# Patient Record
Sex: Male | Born: 1970 | Race: White | Hispanic: No | Marital: Single | Smoking: Current every day smoker
Health system: Southern US, Community
[De-identification: ages and names within clinical notes are randomized; demographics above are authoritative.]

## PROBLEM LIST (undated history)

## (undated) DIAGNOSIS — Z950 Presence of cardiac pacemaker: Secondary | ICD-10-CM

## (undated) DIAGNOSIS — Z720 Tobacco use: Secondary | ICD-10-CM

## (undated) DIAGNOSIS — R0600 Dyspnea, unspecified: Secondary | ICD-10-CM

## (undated) DIAGNOSIS — F1110 Opioid abuse, uncomplicated: Secondary | ICD-10-CM

## (undated) DIAGNOSIS — F1011 Alcohol abuse, in remission: Secondary | ICD-10-CM

## (undated) DIAGNOSIS — M545 Low back pain, unspecified: Secondary | ICD-10-CM

## (undated) DIAGNOSIS — Z9889 Other specified postprocedural states: Secondary | ICD-10-CM

## (undated) DIAGNOSIS — M961 Postlaminectomy syndrome, not elsewhere classified: Secondary | ICD-10-CM

## (undated) DIAGNOSIS — J45909 Unspecified asthma, uncomplicated: Secondary | ICD-10-CM

## (undated) DIAGNOSIS — R45850 Homicidal ideations: Secondary | ICD-10-CM

## (undated) DIAGNOSIS — F319 Bipolar disorder, unspecified: Secondary | ICD-10-CM

## (undated) DIAGNOSIS — G8929 Other chronic pain: Secondary | ICD-10-CM

## (undated) DIAGNOSIS — Z96659 Presence of unspecified artificial knee joint: Secondary | ICD-10-CM

## (undated) DIAGNOSIS — N529 Male erectile dysfunction, unspecified: Secondary | ICD-10-CM

## (undated) DIAGNOSIS — M6281 Muscle weakness (generalized): Secondary | ICD-10-CM

## (undated) DIAGNOSIS — D126 Benign neoplasm of colon, unspecified: Secondary | ICD-10-CM

## (undated) DIAGNOSIS — N400 Enlarged prostate without lower urinary tract symptoms: Secondary | ICD-10-CM

## (undated) DIAGNOSIS — J189 Pneumonia, unspecified organism: Secondary | ICD-10-CM

## (undated) DIAGNOSIS — E876 Hypokalemia: Secondary | ICD-10-CM

## (undated) DIAGNOSIS — E7800 Pure hypercholesterolemia, unspecified: Secondary | ICD-10-CM

## (undated) DIAGNOSIS — K5900 Constipation, unspecified: Secondary | ICD-10-CM

## (undated) DIAGNOSIS — F1910 Other psychoactive substance abuse, uncomplicated: Secondary | ICD-10-CM

## (undated) DIAGNOSIS — G9520 Unspecified cord compression: Secondary | ICD-10-CM

## (undated) DIAGNOSIS — J449 Chronic obstructive pulmonary disease, unspecified: Secondary | ICD-10-CM

## (undated) DIAGNOSIS — K219 Gastro-esophageal reflux disease without esophagitis: Secondary | ICD-10-CM

## (undated) DIAGNOSIS — Z8673 Personal history of transient ischemic attack (TIA), and cerebral infarction without residual deficits: Secondary | ICD-10-CM

## (undated) DIAGNOSIS — Z5989 Other problems related to housing and economic circumstances: Secondary | ICD-10-CM

## (undated) DIAGNOSIS — I639 Cerebral infarction, unspecified: Secondary | ICD-10-CM

## (undated) DIAGNOSIS — G894 Chronic pain syndrome: Secondary | ICD-10-CM

## (undated) DIAGNOSIS — F1290 Cannabis use, unspecified, uncomplicated: Secondary | ICD-10-CM

## (undated) DIAGNOSIS — R569 Unspecified convulsions: Secondary | ICD-10-CM

## (undated) DIAGNOSIS — G839 Paralytic syndrome, unspecified: Secondary | ICD-10-CM

## (undated) DIAGNOSIS — I10 Essential (primary) hypertension: Secondary | ICD-10-CM

## (undated) DIAGNOSIS — R519 Headache, unspecified: Secondary | ICD-10-CM

## (undated) DIAGNOSIS — M48061 Spinal stenosis, lumbar region without neurogenic claudication: Secondary | ICD-10-CM

## (undated) DIAGNOSIS — G959 Disease of spinal cord, unspecified: Secondary | ICD-10-CM

## (undated) DIAGNOSIS — M797 Fibromyalgia: Secondary | ICD-10-CM

## (undated) DIAGNOSIS — F209 Schizophrenia, unspecified: Secondary | ICD-10-CM

## (undated) DIAGNOSIS — Z550 Illiteracy and low-level literacy: Secondary | ICD-10-CM

## (undated) DIAGNOSIS — M1990 Unspecified osteoarthritis, unspecified site: Secondary | ICD-10-CM

## (undated) DIAGNOSIS — I6789 Other cerebrovascular disease: Secondary | ICD-10-CM

## (undated) DIAGNOSIS — Z79899 Other long term (current) drug therapy: Secondary | ICD-10-CM

## (undated) DIAGNOSIS — R748 Abnormal levels of other serum enzymes: Secondary | ICD-10-CM

## (undated) HISTORY — PX: SHOULDER SURGERY: SHX246

## (undated) HISTORY — PX: CLAVICLE SURGERY: SHX598

## (undated) HISTORY — PX: KNEE SURGERY: SHX244

## (undated) HISTORY — DX: Cerebral infarction, unspecified: I63.9

## (undated) HISTORY — PX: CERVICAL SPINE SURGERY: SHX589

## (undated) HISTORY — PX: NECK SURGERY: SHX720

---

## 1997-11-17 ENCOUNTER — Emergency Department (HOSPITAL_COMMUNITY): Admission: EM | Admit: 1997-11-17 | Discharge: 1997-11-17 | Payer: Self-pay | Admitting: Emergency Medicine

## 1998-07-25 ENCOUNTER — Emergency Department (HOSPITAL_COMMUNITY): Admission: EM | Admit: 1998-07-25 | Discharge: 1998-07-25 | Payer: Self-pay

## 2004-08-31 ENCOUNTER — Ambulatory Visit: Payer: Self-pay | Admitting: General Surgery

## 2004-09-13 ENCOUNTER — Emergency Department: Payer: Self-pay | Admitting: Emergency Medicine

## 2004-09-13 ENCOUNTER — Other Ambulatory Visit: Payer: Self-pay

## 2006-11-19 ENCOUNTER — Emergency Department: Payer: Self-pay | Admitting: Emergency Medicine

## 2008-12-08 ENCOUNTER — Ambulatory Visit: Payer: Self-pay | Admitting: Adult Health

## 2008-12-15 ENCOUNTER — Ambulatory Visit: Payer: Self-pay

## 2008-12-26 ENCOUNTER — Ambulatory Visit: Payer: Self-pay

## 2009-04-06 ENCOUNTER — Emergency Department: Payer: Self-pay | Admitting: Emergency Medicine

## 2010-01-09 ENCOUNTER — Ambulatory Visit: Payer: Self-pay | Admitting: Internal Medicine

## 2010-01-15 ENCOUNTER — Ambulatory Visit: Payer: Self-pay | Admitting: Internal Medicine

## 2010-02-23 DIAGNOSIS — J449 Chronic obstructive pulmonary disease, unspecified: Secondary | ICD-10-CM | POA: Insufficient documentation

## 2010-07-22 DIAGNOSIS — G9520 Unspecified cord compression: Secondary | ICD-10-CM | POA: Insufficient documentation

## 2010-07-22 DIAGNOSIS — M961 Postlaminectomy syndrome, not elsewhere classified: Secondary | ICD-10-CM | POA: Insufficient documentation

## 2010-08-30 DIAGNOSIS — M545 Low back pain, unspecified: Secondary | ICD-10-CM | POA: Insufficient documentation

## 2010-09-09 DIAGNOSIS — Z8673 Personal history of transient ischemic attack (TIA), and cerebral infarction without residual deficits: Secondary | ICD-10-CM

## 2010-09-09 HISTORY — DX: Personal history of transient ischemic attack (TIA), and cerebral infarction without residual deficits: Z86.73

## 2010-09-30 DIAGNOSIS — R531 Weakness: Secondary | ICD-10-CM | POA: Insufficient documentation

## 2010-09-30 DIAGNOSIS — Z720 Tobacco use: Secondary | ICD-10-CM | POA: Insufficient documentation

## 2010-10-17 ENCOUNTER — Inpatient Hospital Stay: Payer: Self-pay | Admitting: Psychiatry

## 2010-12-17 DIAGNOSIS — N400 Enlarged prostate without lower urinary tract symptoms: Secondary | ICD-10-CM | POA: Insufficient documentation

## 2011-11-11 DIAGNOSIS — K5900 Constipation, unspecified: Secondary | ICD-10-CM | POA: Insufficient documentation

## 2012-01-06 DIAGNOSIS — E7800 Pure hypercholesterolemia, unspecified: Secondary | ICD-10-CM | POA: Insufficient documentation

## 2012-04-09 DIAGNOSIS — R059 Cough, unspecified: Secondary | ICD-10-CM | POA: Insufficient documentation

## 2012-04-09 DIAGNOSIS — R05 Cough: Secondary | ICD-10-CM | POA: Insufficient documentation

## 2012-04-09 DIAGNOSIS — M48061 Spinal stenosis, lumbar region without neurogenic claudication: Secondary | ICD-10-CM | POA: Insufficient documentation

## 2012-07-17 DIAGNOSIS — Z79899 Other long term (current) drug therapy: Secondary | ICD-10-CM | POA: Insufficient documentation

## 2012-07-17 DIAGNOSIS — G894 Chronic pain syndrome: Secondary | ICD-10-CM | POA: Insufficient documentation

## 2012-10-03 DIAGNOSIS — K219 Gastro-esophageal reflux disease without esophagitis: Secondary | ICD-10-CM | POA: Insufficient documentation

## 2012-10-03 DIAGNOSIS — I10 Essential (primary) hypertension: Secondary | ICD-10-CM | POA: Insufficient documentation

## 2012-10-03 DIAGNOSIS — S8990XA Unspecified injury of unspecified lower leg, initial encounter: Secondary | ICD-10-CM | POA: Insufficient documentation

## 2013-06-28 DIAGNOSIS — F1110 Opioid abuse, uncomplicated: Secondary | ICD-10-CM | POA: Insufficient documentation

## 2013-06-28 DIAGNOSIS — F609 Personality disorder, unspecified: Secondary | ICD-10-CM | POA: Insufficient documentation

## 2013-07-28 ENCOUNTER — Ambulatory Visit: Payer: Self-pay | Admitting: Family Medicine

## 2013-10-11 DIAGNOSIS — E785 Hyperlipidemia, unspecified: Secondary | ICD-10-CM | POA: Insufficient documentation

## 2013-10-11 DIAGNOSIS — I10 Essential (primary) hypertension: Secondary | ICD-10-CM | POA: Insufficient documentation

## 2013-12-02 ENCOUNTER — Emergency Department: Payer: Self-pay | Admitting: Emergency Medicine

## 2014-06-05 LAB — SALICYLATE LEVEL: Salicylates, Serum: <4 mg/dL

## 2014-06-05 LAB — CBC
HCT: 41.8 % (ref 40.0–52.0)
HGB: 14.7 g/dL (ref 13.0–18.0)
MCH: 30.9 pg (ref 26.0–34.0)
MCHC: 35.3 g/dL (ref 32.0–36.0)
MCV: 88 fL (ref 80–100)
Platelet: 236 10*3/uL (ref 150–440)
RBC: 4.76 10*6/uL (ref 4.40–5.90)
RDW: 13.4 % (ref 11.5–14.5)
WBC: 9.2 10*3/uL (ref 3.8–10.6)

## 2014-06-05 LAB — URINALYSIS, COMPLETE
BILIRUBIN, UR: NEGATIVE
Blood: NEGATIVE
GLUCOSE, UR: NEGATIVE mg/dL (ref 0–75)
Leukocyte Esterase: NEGATIVE
Nitrite: NEGATIVE
PH: 6 (ref 4.5–8.0)
Protein: NEGATIVE
Specific Gravity: 1.005 (ref 1.003–1.030)
Squamous Epithelial: NONE SEEN

## 2014-06-05 LAB — ETHANOL: Ethanol: <5 mg/dL

## 2014-06-05 LAB — ACETAMINOPHEN LEVEL: Acetaminophen: <10 ug/mL

## 2014-06-05 LAB — DRUG SCREEN, URINE
Amphetamines, Ur Screen: NEGATIVE
Barbiturates, Ur Screen: NEGATIVE
Benzodiazepine, Ur Scrn: NEGATIVE
Cannabinoid 50 Ng, Ur ~~LOC~~: POSITIVE
Cocaine Metabolite,Ur ~~LOC~~: NEGATIVE
MDMA (Ecstasy)Ur Screen: NEGATIVE
METHADONE, UR SCREEN: NEGATIVE
OPIATE, UR SCREEN: NEGATIVE
PHENCYCLIDINE (PCP) UR S: NEGATIVE
Tricyclic, Ur Screen: NEGATIVE

## 2014-06-05 LAB — COMPREHENSIVE METABOLIC PANEL
AST: 38 U/L
Albumin: 4.5 g/dL
Alkaline Phosphatase: 55 U/L
Anion Gap: 10 (ref 7–16)
BUN: 5 mg/dL — AB
Bilirubin,Total: 0.7 mg/dL
CHLORIDE: 98 mmol/L — AB
CO2: 27 mmol/L
Calcium, Total: 9.3 mg/dL
Creatinine: 0.81 mg/dL
GLUCOSE: 92 mg/dL
Potassium: 3 mmol/L — ABNORMAL LOW
SGPT (ALT): 30 U/L
Sodium: 135 mmol/L
Total Protein: 7.8 g/dL

## 2014-06-06 ENCOUNTER — Inpatient Hospital Stay
Admit: 2014-06-06 | Disposition: A | Discharge status: Home / Self Care | Payer: Self-pay | Attending: Psychiatry | Admitting: Psychiatry

## 2014-06-08 LAB — POTASSIUM: POTASSIUM: 3.7 mmol/L
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
## 2014-06-22 ENCOUNTER — Encounter: Payer: Self-pay | Admitting: Emergency Medicine

## 2014-06-22 ENCOUNTER — Emergency Department
Admission: EM | Admit: 2014-06-22 | Discharge: 2014-06-23 | Disposition: A | Discharge status: Home / Self Care | Payer: Medicaid Other | Attending: Emergency Medicine | Admitting: Emergency Medicine

## 2014-06-22 DIAGNOSIS — F319 Bipolar disorder, unspecified: Secondary | ICD-10-CM | POA: Insufficient documentation

## 2014-06-22 DIAGNOSIS — R4689 Other symptoms and signs involving appearance and behavior: Secondary | ICD-10-CM

## 2014-06-22 DIAGNOSIS — F919 Conduct disorder, unspecified: Secondary | ICD-10-CM | POA: Diagnosis present

## 2014-06-22 DIAGNOSIS — Z79899 Other long term (current) drug therapy: Secondary | ICD-10-CM | POA: Insufficient documentation

## 2014-06-22 HISTORY — DX: Schizophrenia, unspecified: F20.9

## 2014-06-22 LAB — COMPREHENSIVE METABOLIC PANEL
ALK PHOS: 56 U/L (ref 38–126)
ALT: 47 U/L (ref 17–63)
AST: 53 U/L — AB (ref 15–41)
Albumin: 4.3 g/dL (ref 3.5–5.0)
Anion gap: 10 (ref 5–15)
BUN: 7 mg/dL (ref 6–20)
CALCIUM: 9.4 mg/dL (ref 8.9–10.3)
CO2: 29 mmol/L (ref 22–32)
Chloride: 95 mmol/L — ABNORMAL LOW (ref 101–111)
Creatinine, Ser: 0.86 mg/dL (ref 0.61–1.24)
Glucose, Bld: 95 mg/dL (ref 65–99)
Potassium: 3.9 mmol/L (ref 3.5–5.1)
SODIUM: 134 mmol/L — AB (ref 135–145)
Total Bilirubin: 0.3 mg/dL (ref 0.3–1.2)
Total Protein: 7.6 g/dL (ref 6.5–8.1)

## 2014-06-22 LAB — URINE DRUG SCREEN, QUALITATIVE (ARMC ONLY)
AMPHETAMINES, UR SCREEN: NOT DETECTED
BENZODIAZEPINE, UR SCRN: NOT DETECTED
Barbiturates, Ur Screen: NOT DETECTED
Cannabinoid 50 Ng, Ur ~~LOC~~: POSITIVE — AB
Cocaine Metabolite,Ur ~~LOC~~: NOT DETECTED
MDMA (Ecstasy)Ur Screen: NOT DETECTED
Methadone Scn, Ur: NOT DETECTED
Opiate, Ur Screen: NOT DETECTED
Phencyclidine (PCP) Ur S: NOT DETECTED
TRICYCLIC, UR SCREEN: POSITIVE — AB

## 2014-06-22 LAB — CBC
HEMATOCRIT: 40.3 % (ref 40.0–52.0)
HEMOGLOBIN: 13.5 g/dL (ref 13.0–18.0)
MCH: 30 pg (ref 26.0–34.0)
MCHC: 33.5 g/dL (ref 32.0–36.0)
MCV: 89.5 fL (ref 80.0–100.0)
PLATELETS: 262 10*3/uL (ref 150–440)
RBC: 4.5 MIL/uL (ref 4.40–5.90)
RDW: 13.7 % (ref 11.5–14.5)
WBC: 11.6 10*3/uL — AB (ref 3.8–10.6)

## 2014-06-22 LAB — URINALYSIS COMPLETE WITH MICROSCOPIC (ARMC ONLY)
BACTERIA UA: NONE SEEN
Bilirubin Urine: NEGATIVE
Glucose, UA: NEGATIVE mg/dL
Hgb urine dipstick: NEGATIVE
Ketones, ur: NEGATIVE mg/dL
NITRITE: NEGATIVE
PROTEIN: NEGATIVE mg/dL
RBC / HPF: NONE SEEN RBC/hpf (ref 0–5)
Specific Gravity, Urine: 1.008 (ref 1.005–1.030)
Squamous Epithelial / LPF: NONE SEEN
pH: 6 (ref 5.0–8.0)

## 2014-06-22 LAB — SALICYLATE LEVEL: Salicylate Lvl: <4 mg/dL (ref 2.8–30.0)

## 2014-06-22 LAB — ACETAMINOPHEN LEVEL: Acetaminophen (Tylenol), Serum: <10 ug/mL — ABNORMAL LOW (ref 10–30)

## 2014-06-22 LAB — ETHANOL

## 2014-06-22 MED ORDER — PANTOPRAZOLE SODIUM 40 MG PO TBEC
DELAYED_RELEASE_TABLET | ORAL | Status: AC
Start: 2014-06-22 — End: 2014-06-22
  Filled 2014-06-22: qty 1

## 2014-06-22 MED ORDER — LORAZEPAM 2 MG PO TABS
ORAL_TABLET | ORAL | Status: AC
  Filled 2014-06-22: qty 1

## 2014-06-22 MED ORDER — LORAZEPAM 2 MG PO TABS
2.0000 mg | ORAL_TABLET | Freq: Once | ORAL | Status: AC
  Administered 2014-06-22: 2 mg via ORAL

## 2014-06-22 MED ORDER — HALOPERIDOL 5 MG PO TABS
ORAL_TABLET | ORAL | Status: AC
  Filled 2014-06-22: qty 1

## 2014-06-22 MED ORDER — NICOTINE 10 MG IN INHA
1.0000 | RESPIRATORY_TRACT | Status: DC | PRN
  Administered 2014-06-22: 18:00:00 via RESPIRATORY_TRACT
  Administered 2014-06-23 (×2): 1 via RESPIRATORY_TRACT

## 2014-06-22 MED ORDER — HALOPERIDOL 5 MG PO TABS
5.0000 mg | ORAL_TABLET | Freq: Once | ORAL | Status: AC
  Administered 2014-06-22: 5 mg via ORAL

## 2014-06-22 MED ORDER — DICLOFENAC SODIUM 75 MG PO TBEC
75.0000 mg | DELAYED_RELEASE_TABLET | Freq: Two times a day (BID) | ORAL | Status: DC
  Administered 2014-06-22 – 2014-06-23 (×2): 75 mg via ORAL
  Filled 2014-06-22 (×3): qty 1

## 2014-06-22 MED ORDER — PREGABALIN 50 MG PO CAPS
ORAL_CAPSULE | ORAL | Status: AC
  Filled 2014-06-22: qty 1

## 2014-06-22 MED ORDER — PANTOPRAZOLE SODIUM 40 MG PO TBEC
40.0000 mg | DELAYED_RELEASE_TABLET | Freq: Every day | ORAL | Status: DC
  Administered 2014-06-22 – 2014-06-23 (×2): 40 mg via ORAL

## 2014-06-22 MED ORDER — PREGABALIN 75 MG PO CAPS
ORAL_CAPSULE | ORAL | Status: AC
  Filled 2014-06-22: qty 2

## 2014-06-22 MED ORDER — FLUTICASONE PROPIONATE HFA 44 MCG/ACT IN AERO
1.0000 | INHALATION_SPRAY | Freq: Two times a day (BID) | RESPIRATORY_TRACT | Status: DC
  Administered 2014-06-22 – 2014-06-23 (×2): 1 via RESPIRATORY_TRACT
  Filled 2014-06-22: qty 10.6

## 2014-06-22 MED ORDER — PREGABALIN 75 MG PO CAPS
200.0000 mg | ORAL_CAPSULE | Freq: Three times a day (TID) | ORAL | Status: DC
  Administered 2014-06-22 – 2014-06-23 (×2): 200 mg via ORAL

## 2014-06-22 MED ORDER — NICOTINE 10 MG IN INHA
RESPIRATORY_TRACT | Status: AC
  Filled 2014-06-22: qty 36

## 2014-06-22 NOTE — ED Notes (Signed)
Pt resting in hallway recliner with eyes closed. No unusual behavior observed. Pt has no needs or concerns at this time.

## 2014-06-22 NOTE — ED Notes (Signed)
Pt report received from Curt BearsJennifer J RN. Pt care assumed. Pt resting in recliner with no needs or concerns at this time.

## 2014-06-22 NOTE — ED Notes (Signed)
BEHAVIORAL HEALTH ROUNDING Patient sleeping: Yes.   Patient alert and oriented: asleep Behavior appropriate: Yes.  ; If no, describe:  Nutrition and fluids offered: asleep Toileting and hygiene offered: asleep Sitter present: no Law enforcement present: Yes, ODS 
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
## 2014-06-22 NOTE — ED Notes (Signed)
Patient to Ed with officers with IVC paperwork. Patient states " I don't know why I am here, the police told me it was for an evaluation". Patient states Dr. Elesa MassedWard is psychiatrist and Dr. Andrey CampanileWilson [facility]. Patient denies hallucinations.
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
## 2014-06-22 NOTE — ED Notes (Signed)
BEHAVIORAL HEALTH ROUNDING Patient sleeping: No. Patient alert and oriented: yes Behavior appropriate: Yes.  ; If no, describe:  Nutrition and fluids offered: Yes  Toileting and hygiene offered: Yes  Sitter present: no Law enforcement present: Yes  
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
## 2014-06-22 NOTE — ED Notes (Signed)
BEHAVIORAL HEALTH ROUNDING Patient sleeping: No. Patient alert and oriented: yes Behavior appropriate: Yes.  ; If no, describe:  Nutrition and fluids offered: Yes  Toileting and hygiene offered: Yes  Sitter present: no Law enforcement present: Yes, ODS 

## 2014-06-22 NOTE — ED Notes (Signed)
Pt provided with sandwich tray and drink.  

## 2014-06-22 NOTE — ED Notes (Signed)

## 2014-06-23 DIAGNOSIS — F3160 Bipolar disorder, current episode mixed, unspecified: Secondary | ICD-10-CM | POA: Insufficient documentation

## 2014-06-23 MED ORDER — PREGABALIN 50 MG PO CAPS
ORAL_CAPSULE | ORAL | Status: AC
Start: 2014-06-23 — End: 2014-06-23
  Filled 2014-06-23: qty 4

## 2014-06-23 MED ORDER — PANTOPRAZOLE SODIUM 40 MG PO TBEC
DELAYED_RELEASE_TABLET | ORAL | Status: AC
Start: 2014-06-23 — End: 2014-06-23
  Filled 2014-06-23: qty 1

## 2014-06-23 MED ORDER — ATORVASTATIN CALCIUM 20 MG PO TABS: 20.0000 mg | ORAL_TABLET | Freq: Every day | ORAL | Status: DC

## 2014-06-23 MED ORDER — HYDROCHLOROTHIAZIDE 25 MG PO TABS
25.0000 mg | ORAL_TABLET | Freq: Once | ORAL | Status: AC
  Administered 2014-06-23: 25 mg via ORAL

## 2014-06-23 MED ORDER — HYDROCHLOROTHIAZIDE 25 MG PO TABS
ORAL_TABLET | ORAL | Status: AC
  Filled 2014-06-23: qty 1

## 2014-06-23 NOTE — ED Notes (Signed)
BEHAVIORAL HEALTH ROUNDING Patient sleeping: No. Patient alert and oriented: yes Behavior appropriate: Yes.  ; If no, describe:  Nutrition and fluids offered: Yes  Toileting and hygiene offered: Yes  Sitter present: no Law enforcement present: Yes  

## 2014-06-23 NOTE — ED Notes (Signed)
BEHAVIORAL HEALTH ROUNDING Patient sleeping: Yes.   Patient alert and oriented: asleep Behavior appropriate: Yes.  ; If no, describe:  Nutrition and fluids offered: asleep Toileting and hygiene offered: asleep Sitter present: no Law enforcement present: Yes, ODS 

## 2014-06-23 NOTE — ED Provider Notes (Addendum)
-----------------------------------------   7:37 AM on 06/23/2014 -----------------------------------------   BP 127/81 mmHg  Pulse 91  Temp(Src) 98 F (36.7 C) (Oral)  Resp 17  Ht 6' (1.829 m)  Wt 240 lb (108.863 kg)  BMI 32.54 kg/m2  SpO2 96%  The patient had no acute events since last update.  Calm and cooperative at this time.  Disposition is pending per Psychiatry/Behavioral Medicine team recommendations.     Arelia Longestavid M , MD 06/23/14 [phone]  Patient is resting comfortably and is no longer suicidal or homicidal. The patient will be discharged with [facility] follow-up.    Arelia Longestavid M , MD 06/23/14 [phone]

## 2014-06-23 NOTE — ED Notes (Signed)
IVC rescinded/Consult completed by Dr.Faheem

## 2014-06-23 NOTE — ED Notes (Signed)
Pt resting in bed with eyes closed. No unusual behavior observed. Pt has no needs or concerns at this time. Will continue to monitor and f/u as needed.  

## 2014-06-23 NOTE — Discharge Instructions (Signed)
Aggression Physically aggressive behavior is common among small children. When frustrated or angry, toddlers may act out. Often, they will push, bite, or hit. Most children show less physical aggression as they grow up. Their language and interpersonal skills improve, too. But continued aggressive behavior is a sign of a problem. This behavior can lead to aggression and delinquency in adolescence and adulthood. Aggressive behavior can be psychological or physical. Forms of psychological aggression include threatening or bullying others. Forms of physical aggression include:  Pushing.  Hitting.  Slapping.  Kicking.  Stabbing.  Shooting.  Raping. PREVENTION  Encouraging the following behaviors can help manage aggression:  Respecting others and valuing differences.  Participating in school and community functions, including sports, music, after-school programs, community groups, and volunteer work.  Talking with an adult when they are sad, depressed, fearful, anxious, or angry. Discussions with a parent or other family member, Veterinary surgeoncounselor, Runner, broadcasting/film/videoteacher, or coach can help.  Avoiding alcohol and drug use.  Dealing with disagreements without aggression, such as conflict resolution. To learn this, children need parents and caregivers to model respectful communication and problem solving.  Limiting exposure to aggression and violence, such as video games that are not age appropriate, violence in the media, or domestic violence. Document Released: 11/28/2006 Document Revised: 04/25/2011 Document Reviewed: 04/08/2010 [facility] Patient Information 2015 [place], [place]. This information is not intended to replace advice given to you by your health care provider. Make sure you discuss any questions you have with your health care provider.  Bipolar Disorder Bipolar disorder is a mental illness. The term bipolar disorder actually is used to describe a group of disorders that all share varying degrees  of emotional highs and lows that can interfere with daily functioning, such as work, school, or relationships. Bipolar disorder also can lead to drug abuse, hospitalization, and suicide. The emotional highs of bipolar disorder are periods of elation or irritability and high energy. These highs can range from a mild form (hypomania) to a severe form (mania). People experiencing episodes of hypomania may appear energetic, excitable, and highly productive. People experiencing mania may behave impulsively or erratically. They often make poor decisions. They may have difficulty sleeping. The most severe episodes of mania can involve having very distorted beliefs or perceptions about the world and seeing or hearing things that are not real (psychotic delusions and hallucinations).  The emotional lows of bipolar disorder (depression) also can range from mild to severe. Severe episodes of bipolar depression can involve psychotic delusions and hallucinations. Sometimes people with bipolar disorder experience a state of mixed mood. Symptoms of hypomania or mania and depression are both present during this mixed-mood episode. SIGNS AND SYMPTOMS There are signs and symptoms of the episodes of hypomania and mania as well as the episodes of depression. The signs and symptoms of hypomania and mania are similar but vary in severity. They include:  Inflated self-esteem or feeling of increased self-confidence.  Decreased need for sleep.  Unusual talkativeness (rapid or pressured speech) or the feeling of a need to keep talking.  Sensation of racing thoughts or constant talking, with quick shifts between topics that may or may not be related (flight of ideas).  Decreased ability to focus or concentrate.  Increased purposeful activity, such as work, studies, or social activity, or nonproductive activity, such as pacing, squirming and fidgeting, or finger and toe tapping.  Impulsive behavior and use of poor judgment,  resulting in high-risk activities, such as having unprotected sex or spending excessive amounts of money. Signs  and symptoms of depression include the following:   Feelings of sadness, hopelessness, or helplessness.  Frequent or uncontrollable episodes of crying.  Lack of feeling anything or caring about anything.  Difficulty sleeping or sleeping too much.  Inability to enjoy the things you used to enjoy.   Desire to be alone all the time.   Feelings of guilt or worthlessness.  Lack of energy or motivation.   Difficulty concentrating, remembering, or making decisions.  Change in appetite or weight beyond normal fluctuations.  Thoughts of death or the desire to harm yourself. DIAGNOSIS  Bipolar disorder is diagnosed through an assessment by your caregiver. Your caregiver will ask questions about your emotional episodes. There are two main types of bipolar disorder. People with type I bipolar disorder have manic episodes with or without depressive episodes. People with type II bipolar disorder have hypomanic episodes and major depressive episodes, which are more serious than mild depression. The type of bipolar disorder you have can make an important difference in how your illness is monitored and treated. Your caregiver may ask questions about your medical history and use of alcohol or drugs, including prescription medication. Certain medical conditions and substances also can cause emotional highs and lows that resemble bipolar disorder (secondary bipolar disorder).  TREATMENT  Bipolar disorder is a long-term illness. It is best controlled with continuous treatment rather than treatment only when symptoms occur. The following treatments can be prescribed for bipolar disorders:  Medication--Medication can be prescribed by a doctor that is an expert in treating mental disorders (psychiatrists). Medications called mood stabilizers are usually prescribed to help control the illness.  Other medications are sometimes added if symptoms of mania, depression, or psychotic delusions and hallucinations occur despite the use of a mood stabilizer.  Talk therapy--Some forms of talk therapy are helpful in providing support, education, and guidance. A combination of medication and talk therapy is best for managing the disorder over time. A procedure in which electricity is applied to your brain through your scalp (electroconvulsive therapy) is used in cases of severe mania when medication and talk therapy do not work or work too slowly. Document Released: 05/09/2000 Document Revised: 05/28/2012 Document Reviewed: 02/27/2012 [facility] Patient Information 2015 [place], [place]. This information is not intended to replace advice given to you by your health care provider. Make sure you discuss any questions you have with your health care provider.

## 2014-06-23 NOTE — ED Notes (Signed)
ED BHU PLACEMENT JUSTIFICATION Is the patient under IVC or is there intent for IVC: Yes.   Is the patient medically cleared: Yes.   Is there vacancy in the ED BHU: No. Is the population mix appropriate for patient: Yes.   Is the patient awaiting placement in inpatient or outpatient setting: No. Has the patient had a psychiatric consult: No. Survey of unit performed for contraband, proper placement and condition of furniture, tampering with fixtures in bathroom, shower, and each patient room: Yes.  ; Findings:  APPEARANCE/BEHAVIOR cooperative NEURO ASSESSMENT Orientation: AAO x3  Hallucinations: No.None noted (Hallucinations) Speech: Normal Gait: uses walker d/t knee brace RESPIRATORY ASSESSMENT Normal expansion.  Clear to auscultation.  No rales, rhonchi, or wheezing. CARDIOVASCULAR ASSESSMENT regular rate and rhythm, S1, S2 normal, no murmur, click, rub or gallop and heart tones audible, skin color wnl, warm and dry GASTROINTESTINAL ASSESSMENT wnl EXTREMITIES Moves all extremities  PLAN OF CARE Provide calm/safe environment. Vital signs assessed twice daily. ED BHU Assessment once each 12-hour shift. Collaborate with intake RN daily or as condition indicates. Assure the ED provider has rounded once each shift. Provide and encourage hygiene. Provide redirection as needed. Assess for escalating behavior; address immediately and inform ED provider.  Assess family dynamic and appropriateness for visitation as needed: Yes.  ; If necessary, describe findings:  Educate the patient/family about BHU procedures/visitation: Yes.  ; If necessary, describe findings:
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
## 2014-06-23 NOTE — ED Notes (Signed)
Pt discharge papers received and pt belongings given to pt to change. 1 Bag of belongings

## 2014-06-23 NOTE — ED Notes (Signed)
Pt discharged home after verbalizing understanding of discharge instructions; nad noted. 

## 2014-06-23 NOTE — ED Notes (Signed)
BEHAVIORAL HEALTH ROUNDING Patient sleeping: Yes.   Patient alert and oriented: sleeping Behavior appropriate: Yes.  ; If no, describe: sleeping Nutrition and fluids offered: sleeping Toileting and hygiene offered: sleeping Sitter present: no Law enforcement present: Yes  

## 2014-06-23 NOTE — ED Notes (Signed)

## 2014-06-23 NOTE — ED Notes (Signed)
BEHAVIORAL HEALTH ROUNDING Patient sleeping: Yes.   Patient alert and oriented: yes Behavior appropriate: Yes.  ; If no, describe:  Nutrition and fluids offered: Yes  Toileting and hygiene offered: Yes  Sitter present: no Law enforcement present: Yes  

## 2014-06-23 NOTE — ED Notes (Signed)
ED BHU PLACEMENT JUSTIFICATION Is the patient under IVC or is there intent for IVC: Yes.   Is the patient medically cleared: Yes.   Is there vacancy in the ED BHU: No. Is the population mix appropriate for patient: Yes.   Is the patient awaiting placement in inpatient or outpatient setting: Yes.   Has the patient had a psychiatric consult: No. Survey of unit performed for contraband, proper placement and condition of furniture, tampering with fixtures in bathroom, shower, and each patient room: Yes.  ; Findings:  APPEARANCE/BEHAVIOR calm and cooperative NEURO ASSESSMENT Orientation: time, place and person Hallucinations: No.None noted (Hallucinations) Speech: Normal Gait: normal RESPIRATORY ASSESSMENT Normal expansion.  Clear to auscultation.  No rales, rhonchi, or wheezing. CARDIOVASCULAR ASSESSMENT regular rate and rhythm, S1, S2 normal, no murmur, click, rub or gallop GASTROINTESTINAL ASSESSMENT soft, nontender, BS WNL, no r/g EXTREMITIES normal strength, tone, and muscle mass PLAN OF CARE Provide calm/safe environment. Vital signs assessed twice daily. ED BHU Assessment once each 12-hour shift. Collaborate with intake RN daily or as condition indicates. Assure the ED provider has rounded once each shift. Provide and encourage hygiene. Provide redirection as needed. Assess for escalating behavior; address immediately and inform ED provider.  Assess family dynamic and appropriateness for visitation as needed: Yes.  ; If necessary, describe findings:  Educate the patient/family about BHU procedures/visitation: Yes.  ; If necessary, describe findings:  

## 2014-07-05 ENCOUNTER — Emergency Department
Admission: EM | Admit: 2014-07-05 | Discharge: 2014-07-05 | Disposition: A | Discharge status: Home / Self Care | Payer: Medicaid Other | Attending: Emergency Medicine | Admitting: Emergency Medicine

## 2014-07-05 ENCOUNTER — Emergency Department: Payer: Medicaid Other

## 2014-07-05 ENCOUNTER — Encounter: Payer: Self-pay | Admitting: Emergency Medicine

## 2014-07-05 DIAGNOSIS — W182XXA Fall in (into) shower or empty bathtub, initial encounter: Secondary | ICD-10-CM | POA: Insufficient documentation

## 2014-07-05 DIAGNOSIS — Z7951 Long term (current) use of inhaled steroids: Secondary | ICD-10-CM | POA: Insufficient documentation

## 2014-07-05 DIAGNOSIS — S8002XA Contusion of left knee, initial encounter: Secondary | ICD-10-CM | POA: Diagnosis not present

## 2014-07-05 DIAGNOSIS — Y9389 Activity, other specified: Secondary | ICD-10-CM | POA: Insufficient documentation

## 2014-07-05 DIAGNOSIS — G8929 Other chronic pain: Secondary | ICD-10-CM | POA: Insufficient documentation

## 2014-07-05 DIAGNOSIS — Z791 Long term (current) use of non-steroidal anti-inflammatories (NSAID): Secondary | ICD-10-CM | POA: Insufficient documentation

## 2014-07-05 DIAGNOSIS — Y998 Other external cause status: Secondary | ICD-10-CM | POA: Diagnosis not present

## 2014-07-05 DIAGNOSIS — Y9289 Other specified places as the place of occurrence of the external cause: Secondary | ICD-10-CM | POA: Diagnosis not present

## 2014-07-05 DIAGNOSIS — M25562 Pain in left knee: Secondary | ICD-10-CM

## 2014-07-05 DIAGNOSIS — S8992XA Unspecified injury of left lower leg, initial encounter: Secondary | ICD-10-CM | POA: Diagnosis present

## 2014-07-05 DIAGNOSIS — Z79899 Other long term (current) drug therapy: Secondary | ICD-10-CM | POA: Insufficient documentation

## 2014-07-05 DIAGNOSIS — S8012XA Contusion of left lower leg, initial encounter: Secondary | ICD-10-CM | POA: Insufficient documentation

## 2014-07-05 DIAGNOSIS — Z720 Tobacco use: Secondary | ICD-10-CM | POA: Diagnosis not present

## 2014-07-05 DIAGNOSIS — I10 Essential (primary) hypertension: Secondary | ICD-10-CM

## 2014-07-05 MED ORDER — HYDROMORPHONE HCL 1 MG/ML IJ SOLN
INTRAMUSCULAR | Status: AC
  Administered 2014-07-05: 1 mg via INTRAMUSCULAR
  Filled 2014-07-05: qty 1

## 2014-07-05 MED ORDER — OXYCODONE-ACETAMINOPHEN 5-325 MG PO TABS: 1.0000 | ORAL_TABLET | ORAL | Status: DC | PRN

## 2014-07-05 MED ORDER — HYDROCODONE-ACETAMINOPHEN 5-325 MG PO TABS
ORAL_TABLET | ORAL | Status: AC
  Filled 2014-07-05: qty 2

## 2014-07-05 MED ORDER — HYDROMORPHONE HCL 1 MG/ML IJ SOLN
1.0000 mg | Freq: Once | INTRAMUSCULAR | Status: AC
  Administered 2014-07-05: 1 mg via INTRAMUSCULAR

## 2014-07-05 MED ORDER — HYDROCODONE-ACETAMINOPHEN 5-325 MG PO TABS
2.0000 | ORAL_TABLET | Freq: Once | ORAL | Status: AC
Start: 2014-07-05 — End: 2014-07-05
  Administered 2014-07-05: 2 via ORAL

## 2014-07-05 NOTE — ED Provider Notes (Signed)
[facility] Emergency Department Provider Note  ____________________________________________  Time seen: 1228  I have reviewed the triage vital signs and the nursing notes.   HISTORY  Chief Complaint Leg Pain and Back Pain   HPI Scott Howell is a 44 y.o. male states that he fell in shower this morning 2.He has a previous injury to his left knee with multiple fractures that was surgically corrected one year ago. He sees an orthopedist in [place] for his chronic knee pain. He was seen there for months ago and started on anti-inflammatories and pain pills. The pain he is experiencing today is more medially to his knee. He denies any head trauma or loss of consciousness. Currently he wears a knee brace from [facility] on his knee. He describes his pain as a 10 out of 10.     Past Medical History  Diagnosis Date  . Schizophrenia     Patient Active Problem List   Diagnosis Date Noted  . Bipolar 1 disorder, mixed     No past surgical history on file.  Current Outpatient Rx  Name  Route  Sig  Dispense  Refill  . potassium chloride (K-DUR,KLOR-CON) 10 MEQ tablet   Oral   Take 10 mEq by mouth 2 (two) times daily.         Marland Kitchen albuterol (PROVENTIL HFA;VENTOLIN HFA) 108 (90 BASE) MCG/ACT inhaler   Inhalation   Inhale 1 puff into the lungs every 6 (six) hours as needed for wheezing or shortness of breath.         Marland Kitchen atorvastatin (LIPITOR) 20 MG tablet   Oral   Take 20 mg by mouth daily.         . beclomethasone (QVAR) 40 MCG/ACT inhaler   Inhalation   Inhale 2 puffs into the lungs 2 (two) times daily.         Marland Kitchen CALCIUM PO   Oral   Take 1 tablet by mouth daily.         . diclofenac (VOLTAREN) 75 MG EC tablet   Oral   Take 75 mg by mouth 2 (two) times daily.         . DULoxetine (CYMBALTA) 60 MG capsule   Oral   Take 60 mg by mouth daily.         . hydrochlorothiazide (HYDRODIURIL) 25 MG tablet   Oral   Take 25 mg by mouth  daily.         Marland Kitchen omeprazole (PRILOSEC) 10 MG capsule   Oral   Take 20 mg by mouth daily.          Marland Kitchen oxyCODONE-acetaminophen (PERCOCET) 5-325 MG per tablet   Oral   Take 1 tablet by mouth every 4 (four) hours as needed for severe pain.   20 tablet   0   . potassium chloride (K-DUR) 10 MEQ tablet   Oral   Take 20 mEq by mouth daily.         . pregabalin (LYRICA) 200 MG capsule   Oral   Take 200 mg by mouth 3 (three) times daily. Take three times a day per [facility] pharmacy         . QUEtiapine (SEROQUEL) 50 MG tablet   Oral   Take 25 mg by mouth See admin instructions. Pharmacy states he takes tid daily and 2 tablets at bed time           Allergies Phentermine  No family history on file.  Social History History  Substance Use Topics  . Smoking status: Current Every Day Smoker  . Smokeless tobacco: Never Used  . Alcohol Use: No    Review of Systems Constitutional: No fever/chills Eyes: No visual changes. ENT: No sore throat. Cardiovascular: Denies chest pain. Respiratory: Denies shortness of breath. Gastrointestinal: No abdominal pain.  No nausea, no vomiting. Genitourinary: Negative for dysuria. Musculoskeletal: Negative for back pain. Skin: Negative for rash. Neurological: Negative for headaches, focal weakness or numbness. 10-point ROS otherwise negative.  ____________________________________________   PHYSICAL EXAM:  VITAL SIGNS: ED Triage Vitals  Enc Vitals Group     BP 07/05/14 1149 131/78 mmHg     Pulse Rate 07/05/14 1149 108     Resp 07/05/14 1149 18     Temp 07/05/14 1149 97.9 F (36.6 C)     Temp Source 07/05/14 1149 Oral     SpO2 07/05/14 1149 97 %     Weight 07/05/14 1149 240 lb (108.863 kg)     Height 07/05/14 1149 6\' 1"  (1.854 m)     Head Cir --      Peak Flow --      Pain Score 07/05/14 1150 10     Pain Loc --      Pain Edu? --      Excl. in GC? --     Constitutional: Alert and oriented. Well appearing and in no acute  distress. Eyes: Conjunctivae are normal. PERRL. EOMI. Head: Atraumatic. Nose: No congestion/rhinnorhea. Neck: No stridor.  No cervical tenderness on palpation. Range of motion within normal limits. Cardiovascular: Normal rate, regular rhythm. Grossly normal heart sounds.  Good peripheral circulation. Respiratory: Normal respiratory effort. Mild bilateral expiratory wheezes attributed to his smoking. No distress Gastrointestinal: Soft and nontender. No distention. No abdominal bruits. No CVA tenderness. Musculoskeletal: Moderate left knee tenderness with some soft tissue edema. There is no effusion noted. Range of motion is restricted secondary to pain. There are several indentions bilaterally made from the brace that he is currently wearing.  Neurologic:  Normal speech and language. No gross focal neurologic deficits are appreciated. Speech is normal. No gait instability. Skin:  Skin is warm, dry and intact. Psychiatric: Mood and affect are normal. Speech and behavior are normal.  ____________________________________________   LABS (all labs ordered are listed, but only abnormal results are displayed)  Labs Reviewed - No data to display RADIOLOGY  Left knee x-ray per radiologist, orthopedic hardware appears to be stable. There are fragments along the lateral tibial plateau. An acute superimposed fracture cannot be excluded and CT was recommended. CT of the left knee again shows the hardware from previous surgery to be stable there are no new fractures seen. There is some small to moderate knee effusion. ____________________________________________   PROCEDURES  Procedure(s) performed: None  Critical Care performed: No  ____________________________________________   INITIAL IMPRESSION / ASSESSMENT AND PLAN / ED COURSE  Pertinent labs & imaging results that were available during my care of the patient were reviewed by me and considered in my medical decision making (see chart for  details).  [facility] He prefers his brace over her lower knee immobilizer. He was given a prescription for Percocet for pain he is to continue on his anti-inflammatories. He is instructed to use ice and elevate to reduce swelling. He has a walker with him ____________________________________________   FINAL CLINICAL IMPRESSION(S) / ED DIAGNOSES  Final diagnoses:  Contusion, knee and lower leg, left, initial encounter  Tommi Rumps, PA-C 07/05/14 1529  Jene Every, MD 07/06/14 [phone]

## 2014-07-05 NOTE — Discharge Instructions (Signed)
Contusion A contusion is a deep bruise. Contusions are the result of an injury that caused bleeding under the skin. The contusion may turn blue, purple, or yellow. Minor injuries will give you a painless contusion, but more severe contusions may stay painful and swollen for a few weeks.  CAUSES  A contusion is usually caused by a blow, trauma, or direct force to an area of the body. SYMPTOMS   Swelling and redness of the injured area.  Bruising of the injured area.  Tenderness and soreness of the injured area.  Pain. DIAGNOSIS  The diagnosis can be made by taking a history and physical exam. An X-ray, CT scan, or MRI may be needed to determine if there were any associated injuries, such as fractures. TREATMENT  Specific treatment will depend on what area of the body was injured. In general, the best treatment for a contusion is resting, icing, elevating, and applying cold compresses to the injured area. Over-the-counter medicines may also be recommended for pain control. Ask your caregiver what the best treatment is for your contusion. HOME CARE INSTRUCTIONS   Put ice on the injured area.  Put ice in a plastic bag.  Place a towel between your skin and the bag.  Leave the ice on for 15-20 minutes, 3-4 times a day, or as directed by your health care provider.  Only take over-the-counter or prescription medicines for pain, discomfort, or fever as directed by your caregiver. Your caregiver may recommend avoiding anti-inflammatory medicines (aspirin, ibuprofen, and naproxen) for 48 hours because these medicines may increase bruising.  Rest the injured area.  If possible, elevate the injured area to reduce swelling. SEEK IMMEDIATE MEDICAL CARE IF:   You have increased bruising or swelling.  You have pain that is getting worse.  Your swelling or pain is not relieved with medicines. MAKE SURE YOU:   Understand these instructions.  Will watch your condition.  Will get help right  away if you are not doing well or get worse. Document Released: 11/10/2004 Document Revised: 02/05/2013 Document Reviewed: 12/06/2010 [facility] Patient Information 2015 [place], [place]. This information is not intended to replace advice given to you by your health care provider. Make sure you discuss any questions you have with your health care provider.  Cryotherapy Cryotherapy is when you put ice on your injury. Ice helps lessen pain and puffiness (swelling) after an injury. Ice works the best when you start using it in the first 24 to 48 hours after an injury. HOME CARE  Put a dry or damp towel between the ice pack and your skin.  You may press gently on the ice pack.  Leave the ice on for no more than 10 to 20 minutes at a time.  Check your skin after 5 minutes to make sure your skin is okay.  Rest at least 20 minutes between ice pack uses.  Stop using ice when your skin loses feeling (numbness).  Do not use ice on someone who cannot tell you when it hurts. This includes small children and people with memory problems (dementia). GET HELP RIGHT AWAY IF:  You have white spots on your skin.  Your skin turns blue or pale.  Your skin feels waxy or hard.  Your puffiness gets worse. MAKE SURE YOU:   Understand these instructions.  Will watch your condition.  Will get help right away if you are not doing well or get worse. Document Released: 07/20/2007 Document Revised: 04/25/2011 Document Reviewed: 09/23/2010 Exit[employer] Patient Information 2015  [employer]. This information is not intended to replace advice given to you by your health care provider. Make sure you discuss any questions you have with your health care provider.

## 2014-07-05 NOTE — ED Notes (Signed)
Patient to ED with c/o left leg pain after fall this morning x2 in shower. Patient reports previous history of injury to that leg and normally walks with walker. Patient also c/o lower back pain.

## 2014-07-06 ENCOUNTER — Emergency Department
Admission: EM | Admit: 2014-07-06 | Discharge: 2014-07-06 | Disposition: A | Discharge status: Home / Self Care | Payer: Medicaid Other | Source: Home / Self Care | Attending: Emergency Medicine | Admitting: Emergency Medicine

## 2014-07-06 ENCOUNTER — Encounter: Payer: Self-pay | Admitting: Emergency Medicine

## 2014-07-06 DIAGNOSIS — M25562 Pain in left knee: Secondary | ICD-10-CM

## 2014-07-06 HISTORY — DX: Chronic obstructive pulmonary disease, unspecified: J44.9

## 2014-07-06 HISTORY — DX: Gastro-esophageal reflux disease without esophagitis: K21.9

## 2014-07-06 HISTORY — DX: Bipolar disorder, unspecified: F31.9

## 2014-07-06 HISTORY — DX: Pure hypercholesterolemia, unspecified: E78.00

## 2014-07-06 HISTORY — DX: Essential (primary) hypertension: I10

## 2014-07-06 NOTE — ED Provider Notes (Signed)
[facility] Emergency Department Provider Note  ____________________________________________  Time seen: Approximately 6:41 AM  I have reviewed the triage vital signs and the nursing notes.   HISTORY  Chief Complaint Knee Pain  }  HPI Scott Howell is a 44 y.o. male patient with a history of chronic knee pain after a bad fracture fell in the shower yesterday was seen in the ER yesterday had an ad a CT of the knee which showed no acute changes patient returned in the evening yesterday planting of continued bed pain increased swelling or he had not reinjured the knee after waiting for me in the ER for some time the pain has improved and the knee is less swollen and is feeling somewhat better reports however that. Tylenol makes him hyper and so he had trouble resting until the Tylenol from the Percocet had gone out of his system he is asking for just oxycodone by itself I told him I could probably give him just a few pills patient denies any other injury patient is not swollen by the ankle   Past Medical History  Diagnosis Date  . Schizophrenia   . Bipolar 1 disorder   . COPD (chronic obstructive pulmonary disease)   . Hypertension   . High cholesterol   . GERD (gastroesophageal reflux disease)     Patient Active Problem List   Diagnosis Date Noted  . Bipolar 1 disorder, mixed     Past Surgical History  Procedure Laterality Date  . Cervical spine surgery      Current Outpatient Rx  Name  Route  Sig  Dispense  Refill  . albuterol (PROVENTIL HFA;VENTOLIN HFA) 108 (90 BASE) MCG/ACT inhaler   Inhalation   Inhale 1 puff into the lungs every 6 (six) hours as needed for wheezing or shortness of breath.         Marland Kitchen atorvastatin (LIPITOR) 20 MG tablet   Oral   Take 20 mg by mouth daily.         . beclomethasone (QVAR) 40 MCG/ACT inhaler   Inhalation   Inhale 2 puffs into the lungs 2 (two) times daily.         Marland Kitchen CALCIUM PO   Oral   Take 1 tablet  by mouth 2 (two) times daily.          . diclofenac (VOLTAREN) 75 MG EC tablet   Oral   Take 75 mg by mouth 2 (two) times daily.         . DULoxetine (CYMBALTA) 60 MG capsule   Oral   Take 60 mg by mouth daily.         . hydrochlorothiazide (HYDRODIURIL) 25 MG tablet   Oral   Take 25 mg by mouth daily.         Marland Kitchen omeprazole (PRILOSEC) 10 MG capsule   Oral   Take 20 mg by mouth daily.          Marland Kitchen oxyCODONE-acetaminophen (PERCOCET) 5-325 MG per tablet   Oral   Take 1 tablet by mouth every 4 (four) hours as needed for severe pain.   20 tablet   0   . potassium chloride (K-DUR) 10 MEQ tablet   Oral   Take 20 mEq by mouth daily.         . pregabalin (LYRICA) 200 MG capsule   Oral   Take 200 mg by mouth 3 (three) times daily. Take three times a day per [facility] pharmacy         .  QUEtiapine (SEROQUEL) 50 MG tablet   Oral   Take 25 mg by mouth See admin instructions. Pharmacy states he takes tid daily and 2 tablets at bed time         . potassium chloride (K-DUR,KLOR-CON) 10 MEQ tablet   Oral   Take 10 mEq by mouth 2 (two) times daily.           Allergies Acetaminophen  History reviewed. No pertinent family history.  Social History History  Substance Use Topics  . Smoking status: Current Every Day Smoker  . Smokeless tobacco: Never Used  . Alcohol Use: No    Review of Systems Constitutional: No fever/chills Eyes: No visual changes. ENT: No sore throat. Cardiovascular: Denies chest pain. Respiratory: Denies shortness of breath. Gastrointestinal: No abdominal pain.  No nausea, no vomiting.  No diarrhea.  No constipation. Genitourinary: Negative for dysuria. Musculoskeletal: Negative for back pain. Skin: Negative for rash. Neurological: Negative for headaches, focal weakness or numbness.  10-point ROS otherwise negative.  ____________________________________________   PHYSICAL EXAM:  VITAL SIGNS: ED Triage Vitals  Enc Vitals Group     BP  07/06/14 0013 136/86 mmHg     Pulse Rate 07/06/14 0013 85     Resp 07/06/14 0013 18     Temp 07/06/14 0013 98.2 F (36.8 C)     Temp Source 07/06/14 0013 Oral     SpO2 07/06/14 0013 97 %     Weight 07/06/14 0013 240 lb (108.863 kg)     Height 07/06/14 0013 6\' 1"  (1.854 m)     Head Cir --      Peak Flow --      Pain Score 07/06/14 0014 6     Pain Loc --      Pain Edu? --      Excl. in GC? --     Constitutional: Alert and oriented. Well appearing and in no acute distress. Eyes: Conjunctivae are normal. PERRL. EOMI. Head: Atraumatic. Nose: No congestion/rhinnorhea. Respiratory: Normal respiratory effort.  No retractions.  Musculoskeletal: No lower extremity tenderness nor edema. Except for over the left knee  No joint effusions. Neurologic:  Normal speech and language. No gross focal neurologic deficits are appreciated. Speech is normal. No gait instability. Skin:  Skin is warm, dry and intact. No rash noted. Psychiatric: Mood and affect are normal. Speech and behavior are normal.  ____________________________________________   LABS (all labs ordered are listed, but only abnormal results are displayed)  Labs Reviewed - No data to display ____________________________________________  EKG  Not done ____________________________________________  RADIOLOGY  Not done ____________________________________________   PROCEDURES  Procedure(s) performed: None  Critical Care performed: No  ____________________________________________   INITIAL IMPRESSION / ASSESSMENT AND PLAN / ED COURSE   ____________________________________________   FINAL CLINICAL IMPRESSION(S) / ED DIAGNOSES  Final diagnoses:  Left knee pain     Arnaldo NatalPaul F Malinda, MD 07/07/14 [phone]

## 2014-07-06 NOTE — ED Notes (Signed)
Pt says he fell in the shower around 830 Saturday morning; seen here for same and xrays were taken; negative for fracture; pt returns with increased pain

## 2014-07-06 NOTE — ED Notes (Signed)
D/c instructions reviewed w/ pt - pt denies any further questions or concerns at present.  Pt instructed to not use alcohol, drive, or operate heavy machinery while take the prescription pain medications as they could make him drowsy - pt verbalized understanding.   
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
## 2014-07-06 NOTE — Discharge Instructions (Signed)
Arthralgia Arthralgia is joint pain. A joint is a place where two bones meet. Joint pain can happen for many reasons. The joint can be bruised, stiff, infected, or weak from aging. Pain usually goes away after resting and taking medicine for soreness.  HOME CARE  Rest the joint as told by your doctor.  Keep the sore joint raised (elevated) for the first 24 hours.  Put ice on the joint area.  Put ice in a plastic bag.  Place a towel between your skin and the bag.  Leave the ice on for 15-20 minutes, 03-04 times a day.  Wear your splint, casting, elastic bandage, or sling as told by your doctor.  Only take medicine as told by your doctor. Do not take aspirin.  Use crutches as told by your doctor. Do not put weight on the joint until told to by your doctor. GET HELP RIGHT AWAY IF:   You have bruising, puffiness (swelling), or more pain.  Your fingers or toes turn blue or start to lose feeling (numb).  Your medicine does not lessen the pain.  Your pain becomes severe.  You have a temperature by mouth above 102 F (38.9 C), not controlled by medicine.  You cannot move or use the joint. MAKE SURE YOU:   Understand these instructions.  Will watch your condition.  Will get help right away if you are not doing well or get worse. Document Released: 01/19/2009 Document Revised: 04/25/2011 Document Reviewed: 01/19/2009 [facility] Patient Information 2015 [place], [place]. This information is not intended to replace advice given to you by your health care provider. Make sure you discuss any questions you have with your health care provider.   [facility]   Use the oxycofone as needed.  Return as needed.

## 2014-07-12 ENCOUNTER — Emergency Department
Admission: EM | Admit: 2014-07-12 | Discharge: 2014-07-12 | Disposition: A | Discharge status: Home / Self Care | Payer: Medicaid Other | Attending: Emergency Medicine | Admitting: Emergency Medicine

## 2014-07-12 DIAGNOSIS — Z791 Long term (current) use of non-steroidal anti-inflammatories (NSAID): Secondary | ICD-10-CM | POA: Diagnosis not present

## 2014-07-12 DIAGNOSIS — I10 Essential (primary) hypertension: Secondary | ICD-10-CM | POA: Insufficient documentation

## 2014-07-12 DIAGNOSIS — G8929 Other chronic pain: Secondary | ICD-10-CM | POA: Diagnosis not present

## 2014-07-12 DIAGNOSIS — Z79899 Other long term (current) drug therapy: Secondary | ICD-10-CM | POA: Insufficient documentation

## 2014-07-12 DIAGNOSIS — Z7951 Long term (current) use of inhaled steroids: Secondary | ICD-10-CM | POA: Insufficient documentation

## 2014-07-12 DIAGNOSIS — Z720 Tobacco use: Secondary | ICD-10-CM | POA: Insufficient documentation

## 2014-07-12 DIAGNOSIS — R609 Edema, unspecified: Secondary | ICD-10-CM | POA: Diagnosis not present

## 2014-07-12 DIAGNOSIS — M25562 Pain in left knee: Secondary | ICD-10-CM | POA: Diagnosis not present

## 2014-07-12 MED ORDER — OXYCODONE HCL 5 MG PO TABS: 5.0000 mg | ORAL_TABLET | Freq: Two times a day (BID) | ORAL | Status: DC

## 2014-07-12 NOTE — ED Notes (Signed)
States fell one week ago. States had pain R periobital and L knee. Has brace on L knee which he states is due to previous injury and surgeries same knee. Her for continued pain.

## 2014-07-12 NOTE — Discharge Instructions (Signed)
Arthralgia Arthralgia is joint pain. A joint is a place where two bones meet. Joint pain can happen for many reasons. The joint can be bruised, stiff, infected, or weak from aging. Pain usually goes away after resting and taking medicine for soreness.  HOME CARE  Rest the joint as told by your doctor.  Keep the sore joint raised (elevated) for the first 24 hours.  Put ice on the joint area.  Put ice in a plastic bag.  Place a towel between your skin and the bag.  Leave the ice on for 15-20 minutes, 03-04 times a day.  Wear your splint, casting, elastic bandage, or sling as told by your doctor.  Only take medicine as told by your doctor. Do not take aspirin.  Use crutches as told by your doctor. Do not put weight on the joint until told to by your doctor. GET HELP RIGHT AWAY IF:   You have bruising, puffiness (swelling), or more pain.  Your fingers or toes turn blue or start to lose feeling (numb).  Your medicine does not lessen the pain.  Your pain becomes severe.  You have a temperature by mouth above 102 F (38.9 C), not controlled by medicine.  You cannot move or use the joint. MAKE SURE YOU:   Understand these instructions.  Will watch your condition.  Will get help right away if you are not doing well or get worse. Document Released: 01/19/2009 Document Revised: 04/25/2011 Document Reviewed: 01/19/2009 [facility] Patient Information 2015 [place], [place]. This information is not intended to replace advice given to you by your health care provider. Make sure you discuss any questions you have with your health care provider.  Chronic Pain Chronic pain can be defined as pain that is off and on and lasts for 3-6 months or longer. Many things cause chronic pain, which can make it difficult to make a diagnosis. There are many treatment options available for chronic pain. However, finding a treatment that works well for you may require trying various approaches until the  right one is found. Many people benefit from a combination of two or more types of treatment to control their pain. SYMPTOMS  Chronic pain can occur anywhere in the body and can range from mild to very severe. Some types of chronic pain include:  Headache.  Low back pain.  Cancer pain.  Arthritis pain.  Neurogenic pain. This is pain resulting from damage to nerves. People with chronic pain may also have other symptoms such as:  Depression.  Anger.  Insomnia.  Anxiety. DIAGNOSIS  Your health care provider will help diagnose your condition over time. In many cases, the initial focus will be on excluding possible conditions that could be causing the pain. Depending on your symptoms, your health care provider may order tests to diagnose your condition. Some of these tests may include:   Blood tests.   CT scan.   MRI.   X-rays.   Ultrasounds.   Nerve conduction studies.  You may need to see a specialist.  TREATMENT  Finding treatment that works well may take time. You may be referred to a pain specialist. He or she may prescribe medicine or therapies, such as:   Mindful meditation or yoga.  Shots (injections) of numbing or pain-relieving medicines into the spine or area of pain.  Local electrical stimulation.  Acupuncture.   Massage therapy.   Aroma, color, light, or sound therapy.   Biofeedback.   Working with a physical therapist to keep from  getting stiff.   Regular, gentle exercise.   Cognitive or behavioral therapy.   Group support.  Sometimes, surgery may be recommended.  HOME CARE INSTRUCTIONS   Take all medicines as directed by your health care provider.   Lessen stress in your life by relaxing and doing things such as listening to calming music.   Exercise or be active as directed by your health care provider.   Eat a healthy diet and include things such as vegetables, fruits, fish, and lean meats in your diet.   Keep all  follow-up appointments with your health care provider.   Attend a support group with others suffering from chronic pain. SEEK MEDICAL CARE IF:   Your pain gets worse.   You develop a new pain that was not there before.   You cannot tolerate medicines given to you by your health care provider.   You have new symptoms since your last visit with your health care provider.  SEEK IMMEDIATE MEDICAL CARE IF:   You feel weak.   You have decreased sensation or numbness.   You lose control of bowel or bladder function.   Your pain suddenly gets much worse.   You develop shaking.  You develop chills.  You develop confusion.  You develop chest pain.  You develop shortness of breath.  MAKE SURE YOU:  Understand these instructions.  Will watch your condition.  Will get help right away if you are not doing well or get worse. Document Released: 10/23/2001 Document Revised: 10/03/2012 Document Reviewed: 07/27/2012 [facility] Patient Information 2015 [place], [place]. This information is not intended to replace advice given to you by your health care provider. Make sure you discuss any questions you have with your health care provider.  Take the prescription meds as directed. Apply ice and rest with the leg elevated to reduce swelling. [facility]

## 2014-07-12 NOTE — ED Provider Notes (Signed)
[facility] Emergency Department Provider Note ____________________________________________  Time seen: 1330  I have reviewed the triage vital signs and the nursing notes.  HISTORY  Chief Complaint Knee Pain and Wound Check  HPI Scott Howell is a 44 y.o. male reports to the ED with continued complaints of pain and swelling to the left knee. He has been seen 2 other times for this same complaint since he initially claimed the injury. He is here today requesting pain medicines, noting that his knee continues to swell. He has difficulty resting with the leg elevated, as he is a resident of the local homeless shelter, and has to be out of the shelter for 8 hours a day. He is here without reinjury rating his pain at a 10 out of 10 currently.  Past Medical History  Diagnosis Date  . Schizophrenia   . Bipolar 1 disorder   . COPD (chronic obstructive pulmonary disease)   . Hypertension   . High cholesterol   . GERD (gastroesophageal reflux disease)    Patient Active Problem List   Diagnosis Date Noted  . Bipolar 1 disorder, mixed    Past Surgical History  Procedure Laterality Date  . Cervical spine surgery     Current Outpatient Rx  Name  Route  Sig  Dispense  Refill  . albuterol (PROVENTIL HFA;VENTOLIN HFA) 108 (90 BASE) MCG/ACT inhaler   Inhalation   Inhale 1 puff into the lungs every 6 (six) hours as needed for wheezing or shortness of breath.         Marland Kitchen atorvastatin (LIPITOR) 20 MG tablet   Oral   Take 20 mg by mouth daily.         . beclomethasone (QVAR) 40 MCG/ACT inhaler   Inhalation   Inhale 2 puffs into the lungs 2 (two) times daily.         Marland Kitchen CALCIUM PO   Oral   Take 1 tablet by mouth 2 (two) times daily.          . diclofenac (VOLTAREN) 75 MG EC tablet   Oral   Take 75 mg by mouth 2 (two) times daily.         . DULoxetine (CYMBALTA) 60 MG capsule   Oral   Take 60 mg by mouth daily.         . hydrochlorothiazide  (HYDRODIURIL) 25 MG tablet   Oral   Take 25 mg by mouth daily.         Marland Kitchen omeprazole (PRILOSEC) 10 MG capsule   Oral   Take 20 mg by mouth daily.          Marland Kitchen oxyCODONE (ROXICODONE) 5 MG immediate release tablet   Oral   Take 1 tablet (5 mg total) by mouth 2 (two) times daily.   8 tablet   0   . oxyCODONE-acetaminophen (PERCOCET) 5-325 MG per tablet   Oral   Take 1 tablet by mouth every 4 (four) hours as needed for severe pain.   20 tablet   0   . potassium chloride (K-DUR) 10 MEQ tablet   Oral   Take 20 mEq by mouth daily.         . potassium chloride (K-DUR,KLOR-CON) 10 MEQ tablet   Oral   Take 10 mEq by mouth 2 (two) times daily.         . pregabalin (LYRICA) 200 MG capsule   Oral   Take 200 mg by mouth 3 (three) times daily. [facility]         . QUEtiapine (SEROQUEL) 50 MG tablet   Oral   Take 25 mg by mouth See admin instructions. Pharmacy states he takes tid daily and 2 tablets at bed time          Allergies Acetaminophen  No family history on file.  Social History History  Substance Use Topics  . Smoking status: Current Every Day Smoker  . Smokeless tobacco: Never Used  . Alcohol Use: No   Review of Systems  Constitutional: Negative for fever. Eyes: Negative for visual changes. ENT: Negative for sore throat. Cardiovascular: Negative for chest pain. Respiratory: Negative for shortness of breath. Gastrointestinal: Negative for abdominal pain, vomiting and diarrhea. Genitourinary: Negative for dysuria. Musculoskeletal: Negative for back pain. Positive for left knee pain & swelling. Skin: Negative for rash. Neurological: Negative for headaches, focal weakness or numbness. ____________________________________________  PHYSICAL EXAM:  VITAL SIGNS: ED Triage Vitals  Enc Vitals Group     BP 07/12/14 1312 130/75 mmHg     Pulse Rate 07/12/14 1312 102     Resp 07/12/14 1312 20     Temp 07/12/14 1312 98 F (36.7  C)     Temp Source 07/12/14 1312 Oral     SpO2 07/12/14 1312 96 %     Weight 07/12/14 1312 238 lb (107.956 kg)     Height 07/12/14 1312 6\' 1"  (1.854 m)     Head Cir --      Peak Flow --      Pain Score 07/12/14 1313 8     Pain Loc --      Pain Edu? --      Excl. in GC? --    Constitutional: Alert and oriented. Well appearing and in no distress. Eyes:Normocephalic and atraumatic Conjunctivae are normal. PERRL. Mucous membranes are moist. Cardiovascular: Normal rate, regular rhythm. Normal distal pulses on the LLE Respiratory: Normal respiratory effort.No wheezes/rales/rhonchi. Gastrointestinal: Soft and nontender. No distention. Musculoskeletal: Nontender with normal range of motion in the left knee. He has edema which is exaggerated by his hinged knee brace.  Neurologic:  Normal speech and language. No gross focal neurologic deficits are appreciated. Skin:  Skin is warm, dry and intact. No rash noted. Psychiatric: Mood and affect are normal. Patient exhibits appropriate insight and judgment. ____________________________________________   RADIOLOGY  CT Scan Left Knee - 07/05/14 Reviewed ____________________________________________  INITIAL IMPRESSION / ASSESSMENT AND PLAN / ED COURSE  Chronic left knee pain with lower extremity swelling exacerbated by old hinged knee brace.  Suggest patient rest with leg elevated and remove brace when not mobile.  Encour[facility] patient [facility].  Advised the patient that he should not return to the ED expecting narcotic pain medicines for this complaint in the near future. Prescription for Oxycodone 5 mg #8 given. ____________________________________________  FINAL CLINICAL IMPRESSION(S) / ED DIAGNOSES  Final diagnoses:  Knee pain, chronic, left     Lissa HoardJenise V Bacon , PA-C 07/12/14 1928  Minna AntisKevin Paduchowski, MD 07/13/14 1248

## 2014-07-12 NOTE — ED Notes (Signed)
Pt states was seen last sat after falling on his "bad knee" in the shower. States usually has to wear knee brace from surgery last may but took it off to get in motel shower and then fell. States has not followed up with ortho for knee. Also states he took a razor blade to the abscess on his face and puss came out.

## 2014-07-28 ENCOUNTER — Encounter: Payer: Self-pay | Admitting: Emergency Medicine

## 2014-07-28 ENCOUNTER — Emergency Department: Payer: Medicaid Other

## 2014-07-28 ENCOUNTER — Emergency Department
Admission: EM | Admit: 2014-07-28 | Discharge: 2014-07-29 | Disposition: A | Discharge status: Home / Self Care | Payer: Medicaid Other | Attending: Emergency Medicine | Admitting: Emergency Medicine

## 2014-07-28 DIAGNOSIS — Z791 Long term (current) use of non-steroidal anti-inflammatories (NSAID): Secondary | ICD-10-CM | POA: Diagnosis not present

## 2014-07-28 DIAGNOSIS — I10 Essential (primary) hypertension: Secondary | ICD-10-CM | POA: Insufficient documentation

## 2014-07-28 DIAGNOSIS — Y9289 Other specified places as the place of occurrence of the external cause: Secondary | ICD-10-CM | POA: Diagnosis not present

## 2014-07-28 DIAGNOSIS — S8992XA Unspecified injury of left lower leg, initial encounter: Secondary | ICD-10-CM | POA: Insufficient documentation

## 2014-07-28 DIAGNOSIS — F3181 Bipolar II disorder: Secondary | ICD-10-CM

## 2014-07-28 DIAGNOSIS — Z79899 Other long term (current) drug therapy: Secondary | ICD-10-CM | POA: Diagnosis not present

## 2014-07-28 DIAGNOSIS — Y998 Other external cause status: Secondary | ICD-10-CM | POA: Insufficient documentation

## 2014-07-28 DIAGNOSIS — Z720 Tobacco use: Secondary | ICD-10-CM | POA: Insufficient documentation

## 2014-07-28 DIAGNOSIS — Z590 Homelessness: Secondary | ICD-10-CM | POA: Diagnosis not present

## 2014-07-28 DIAGNOSIS — F1210 Cannabis abuse, uncomplicated: Secondary | ICD-10-CM

## 2014-07-28 DIAGNOSIS — Y9389 Activity, other specified: Secondary | ICD-10-CM | POA: Diagnosis not present

## 2014-07-28 DIAGNOSIS — E876 Hypokalemia: Secondary | ICD-10-CM | POA: Insufficient documentation

## 2014-07-28 DIAGNOSIS — M25562 Pain in left knee: Secondary | ICD-10-CM

## 2014-07-28 DIAGNOSIS — M25569 Pain in unspecified knee: Secondary | ICD-10-CM

## 2014-07-28 DIAGNOSIS — G8929 Other chronic pain: Secondary | ICD-10-CM

## 2014-07-28 LAB — COMPREHENSIVE METABOLIC PANEL
ALT: 32 U/L (ref 17–63)
AST: 49 U/L — ABNORMAL HIGH (ref 15–41)
Albumin: 4.4 g/dL (ref 3.5–5.0)
Alkaline Phosphatase: 66 U/L (ref 38–126)
Anion gap: 8 (ref 5–15)
BUN: 8 mg/dL (ref 6–20)
CALCIUM: 8.4 mg/dL — AB (ref 8.9–10.3)
CO2: 22 mmol/L (ref 22–32)
CREATININE: 0.92 mg/dL (ref 0.61–1.24)
Chloride: 100 mmol/L — ABNORMAL LOW (ref 101–111)
Glucose, Bld: 106 mg/dL — ABNORMAL HIGH (ref 65–99)
Potassium: 2.4 mmol/L — CL (ref 3.5–5.1)
SODIUM: 130 mmol/L — AB (ref 135–145)
Total Bilirubin: 0.3 mg/dL (ref 0.3–1.2)
Total Protein: 7.9 g/dL (ref 6.5–8.1)

## 2014-07-28 LAB — URINALYSIS COMPLETE WITH MICROSCOPIC (ARMC ONLY)
BACTERIA UA: NONE SEEN
BILIRUBIN URINE: NEGATIVE
Glucose, UA: NEGATIVE mg/dL
HGB URINE DIPSTICK: NEGATIVE
KETONES UR: NEGATIVE mg/dL
LEUKOCYTES UA: NEGATIVE
Nitrite: NEGATIVE
PH: 5 (ref 5.0–8.0)
Protein, ur: NEGATIVE mg/dL
RBC / HPF: NONE SEEN RBC/hpf (ref 0–5)
SPECIFIC GRAVITY, URINE: 1.005 (ref 1.005–1.030)
Squamous Epithelial / LPF: NONE SEEN

## 2014-07-28 LAB — URINE DRUG SCREEN, QUALITATIVE (ARMC ONLY)
AMPHETAMINES, UR SCREEN: NOT DETECTED
BENZODIAZEPINE, UR SCRN: NOT DETECTED
Barbiturates, Ur Screen: NOT DETECTED
Cannabinoid 50 Ng, Ur ~~LOC~~: POSITIVE — AB
Cocaine Metabolite,Ur ~~LOC~~: NOT DETECTED
MDMA (ECSTASY) UR SCREEN: NOT DETECTED
Methadone Scn, Ur: NOT DETECTED
Opiate, Ur Screen: NOT DETECTED
Phencyclidine (PCP) Ur S: NOT DETECTED
Tricyclic, Ur Screen: POSITIVE — AB

## 2014-07-28 LAB — CBC
HEMATOCRIT: 41.9 % (ref 40.0–52.0)
HEMOGLOBIN: 14.4 g/dL (ref 13.0–18.0)
MCH: 30.8 pg (ref 26.0–34.0)
MCHC: 34.4 g/dL (ref 32.0–36.0)
MCV: 89.5 fL (ref 80.0–100.0)
PLATELETS: 291 10*3/uL (ref 150–440)
RBC: 4.68 MIL/uL (ref 4.40–5.90)
RDW: 14.1 % (ref 11.5–14.5)
WBC: 11.7 10*3/uL — ABNORMAL HIGH (ref 3.8–10.6)

## 2014-07-28 LAB — ACETAMINOPHEN LEVEL

## 2014-07-28 LAB — SALICYLATE LEVEL: Salicylate Lvl: <4 mg/dL (ref 2.8–30.0)

## 2014-07-28 LAB — ETHANOL: ALCOHOL ETHYL (B): 101 mg/dL — AB (ref <5)

## 2014-07-28 MED ORDER — POTASSIUM CHLORIDE CRYS ER 20 MEQ PO TBCR
EXTENDED_RELEASE_TABLET | ORAL | Status: AC
  Administered 2014-07-28: 40 meq via ORAL
  Filled 2014-07-28: qty 2

## 2014-07-28 MED ORDER — POTASSIUM CHLORIDE CRYS ER 20 MEQ PO TBCR
40.0000 meq | EXTENDED_RELEASE_TABLET | Freq: Once | ORAL | Status: AC
  Administered 2014-07-29: 40 meq via ORAL

## 2014-07-28 MED ORDER — POTASSIUM CHLORIDE CRYS ER 20 MEQ PO TBCR
40.0000 meq | EXTENDED_RELEASE_TABLET | Freq: Once | ORAL | Status: AC
  Administered 2014-07-28: 40 meq via ORAL

## 2014-07-28 MED ORDER — IBUPROFEN 800 MG PO TABS
ORAL_TABLET | ORAL | Status: AC
  Administered 2014-07-28: 800 mg via ORAL
  Filled 2014-07-28: qty 1

## 2014-07-28 MED ORDER — IBUPROFEN 800 MG PO TABS
800.0000 mg | ORAL_TABLET | Freq: Once | ORAL | Status: AC
  Administered 2014-07-28: 800 mg via ORAL

## 2014-07-28 NOTE — ED Notes (Signed)
BEHAVIORAL HEALTH ROUNDING Patient sleeping: No. Patient alert and oriented: yes Behavior appropriate: Yes.   Nutrition and fluids offered: Yes  Toileting and hygiene offered: Yes  Sitter present: q15 min observations Law enforcement present: Yes [employer] 

## 2014-07-28 NOTE — ED Notes (Signed)
Per pt "I have not been taking my mental meds because they make me too sleepy". "I take cymbalta 60mg  once a day and right now I am taking seroquel 50mg  3 times a day and 100 mg at bedtime, but the dr wrote me a new prescription that I haven't refilled yet that is supposed to be seroquel 100 mg 3 times a day and 200mg  at bedtime". Pt also states " I just need to get back on my mental meds".

## 2014-07-28 NOTE — ED Notes (Signed)
BEHAVIORAL HEALTH ROUNDING Patient sleeping: No. Patient alert and oriented: yes Behavior appropriate: Yes.   Nutrition and fluids offered: Yes  Toileting and hygiene offered: Yes  Sitter present: q15 min observations and security camera monitoring Law enforcement present: Yes [employer]  ENVIRONMENTAL ASSESSMENT Potentially harmful objects out of patient reach: Yes.   Personal belongings secured: Yes.   Patient dressed in hospital provided attire only: Yes.   Plastic bags out of patient reach: Yes.   Patient care equipment (cords, cables, call bells, lines, and drains) shortened, removed, or accounted for: Yes.   Equipment and supplies removed from bottom of stretcher: Yes.   Potentially toxic materials out of patient reach: Yes.   Sharps container removed or out of patient reach: Yes.   
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
## 2014-07-28 NOTE — ED Notes (Signed)
Brought in via ems for beh med.eval and knee pain.

## 2014-07-29 DIAGNOSIS — F3181 Bipolar II disorder: Secondary | ICD-10-CM

## 2014-07-29 DIAGNOSIS — G8929 Other chronic pain: Secondary | ICD-10-CM

## 2014-07-29 DIAGNOSIS — M25569 Pain in unspecified knee: Secondary | ICD-10-CM

## 2014-07-29 DIAGNOSIS — F1210 Cannabis abuse, uncomplicated: Secondary | ICD-10-CM

## 2014-07-29 LAB — POTASSIUM: Potassium: 3.7 mmol/L (ref 3.5–5.1)

## 2014-07-29 MED ORDER — QUETIAPINE FUMARATE 25 MG PO TABS
ORAL_TABLET | ORAL | Status: AC
  Administered 2014-07-29: 50 mg via ORAL
  Filled 2014-07-29: qty 2

## 2014-07-29 MED ORDER — NICOTINE 10 MG IN INHA
RESPIRATORY_TRACT | Status: AC
  Administered 2014-07-29: 1 via RESPIRATORY_TRACT
  Filled 2014-07-29: qty 36

## 2014-07-29 MED ORDER — QUETIAPINE FUMARATE 25 MG PO TABS
50.0000 mg | ORAL_TABLET | Freq: Three times a day (TID) | ORAL | Status: DC
  Administered 2014-07-29 (×2): 50 mg via ORAL

## 2014-07-29 MED ORDER — PREGABALIN 50 MG PO CAPS
ORAL_CAPSULE | ORAL | Status: AC
  Filled 2014-07-29: qty 4

## 2014-07-29 MED ORDER — NICOTINE 10 MG IN INHA
1.0000 | RESPIRATORY_TRACT | Status: DC | PRN
  Administered 2014-07-29: 1 via RESPIRATORY_TRACT

## 2014-07-29 MED ORDER — QUETIAPINE FUMARATE 25 MG PO TABS: 100.0000 mg | ORAL_TABLET | Freq: Every day | ORAL | Status: DC

## 2014-07-29 MED ORDER — PREGABALIN 75 MG PO CAPS
200.0000 mg | ORAL_CAPSULE | Freq: Three times a day (TID) | ORAL | Status: DC
  Administered 2014-07-29 (×2): 200 mg via ORAL

## 2014-07-29 MED ORDER — POTASSIUM CHLORIDE CRYS ER 20 MEQ PO TBCR
EXTENDED_RELEASE_TABLET | ORAL | Status: AC
  Administered 2014-07-29: 40 meq via ORAL
  Filled 2014-07-29: qty 2

## 2014-07-29 NOTE — ED Notes (Signed)
BEHAVIORAL HEALTH ROUNDING Patient sleeping: No. Patient alert and oriented: yes Behavior appropriate: Yes.  ;  Nutrition and fluids offered: Yes  Toileting and hygiene offered: Yes  Sitter present: yes Law enforcement present: Yes  

## 2014-07-29 NOTE — ED Notes (Signed)
BEHAVIORAL HEALTH ROUNDING Patient sleeping: No. Patient alert and oriented: yes Behavior appropriate: No.; Nutrition and fluids offered: Yes  Toileting and hygiene offered: Yes  Sitter present: yes Law enforcement present: Yes   

## 2014-07-29 NOTE — ED Notes (Signed)
Pt given breakfast tray, pt ate 100%, pt asking for medications stating "I am already behind"
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
## 2014-07-29 NOTE — ED Notes (Signed)
BEHAVIORAL HEALTH ROUNDING Patient sleeping: Yes.   Patient alert and oriented: not applicable Behavior appropriate: Yes.    Nutrition and fluids offered: No Toileting and hygiene offered: No Sitter present: q15 minute observations Law enforcement present: Yes [employer] 
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
## 2014-07-29 NOTE — ED Notes (Signed)
BEHAVIORAL HEALTH ROUNDING Patient sleeping: Yes.   Patient alert and oriented: yes Behavior appropriate: Yes.  ;  Nutrition and fluids offered: Yes  Toileting and hygiene offered: Yes  Sitter present: yes Law enforcement present: Yes  

## 2014-07-29 NOTE — ED Notes (Signed)
BEHAVIORAL HEALTH ROUNDING Patient sleeping: No. Patient alert and oriented: yes Behavior appropriate: Yes.   Nutrition and fluids offered: Yes  Toileting and hygiene offered: Yes  Sitter present: yes Law enforcement present: Yes   Lunch tray given to pt, [facility] Child psychotherapist in room with pt

## 2014-07-29 NOTE — ED Provider Notes (Addendum)
-----------------------------------------   5:55 PM on 07/29/2014 -----------------------------------------   BP 110/85 mmHg  Pulse 94  Temp(Src) 98.1 F (36.7 C) (Oral)  Resp 20  Ht 6\' 1"  (1.854 m)  Wt 235 lb (106.595 kg)  BMI 31.01 kg/m2  SpO2 96%  Dr. Toni Amend feels that the patient is appropriate for discharge and that the patient does not represent a danger to himself or others.  Dr. Toni Amend is fully aware of the vague threats that the patient made but in his professional opinion the patient does not represent a safety danger to anyone.  The patient has an outpatient doctor with whom he can follow-up.  Of note, his repeat potassium this morning was normal and reassuring.  Loleta Rose, MD 07/29/14 [phone]
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
## 2014-07-29 NOTE — Consult Note (Signed)
Scott Howell Face-to-Face Psychiatry Consult   Reason for Consult:  Consult for this 44 year old man with a history of bipolar disorder type II and substance abuse. Possible history of schizophrenia. Referring Physician:  Karma Greaser Patient Identification: Scott Howell MRN:  [id number] Principal Diagnosis: Bipolar 2 disorder Diagnosis:   Patient Active Problem List   Diagnosis Date Noted  . Bipolar 2 disorder [F31.81] 07/29/2014  . Marijuana abuse [F12.10] 07/29/2014  . Chronic knee pain [M25.569, G89.29] 07/29/2014  . Bipolar 1 disorder, mixed [F31.60]     Total Time spent with patient: 1 hour  Subjective:   Scott Howell is a 44 y.o. male patient admitted with "I need to get out of the heat". Patient presented to the emergency room with vague psychiatric complaints but a wish to get out of the heat because he hasn't had any place to stay recently. See full history below. Noncompliant with medication.Marland Kitchen  HPI:  Information from the patient and the chart. Patient says that for the last couple days he's been staying first in hotels and then out on the street. He was in the shelter until the weekend but left there allegedly because there was "too much drinking". He says his mood has been "terrible" but it's not very specific in what way he is feeling bad. Not necessarily sad. Says he gets irritated but has not been aggressive or violent. Doesn't have any specific thoughts of hurting anyone else. He says he sleeps poorly at night because he's been in a dangerous situation and has not felt comfortable falling asleep. On the other hand he says he is not taking his Seroquel because it makes him feel too sleepy. He says that Dr. Leonides Schanz has prescribed Seroquel for him but he has been only taking about her to the total dose because it makes him too sleepy. He denies that he's having auditory or visual hallucinations. He denies any suicidal thoughts. No specific new stress. He claims that yesterday he drank 6 beers  and that that was the first time in 11 years he had consumed any alcohol. He tells me that he doesn't do any other drugs whatsoever, except for marijuana of course which she smokes daily.  Past psychiatric history: History of chronic mental health problems. He's had positive hospitalizations in the past. Diagnosis of bipolar disorder type II. No history of suicide attempts. No history of aggressive or violent behavior. Has been prescribed Seroquel 100 mg 3 times a day and 200 mg at night but only takes a total of about 150 mg of it. Says that he's taken many other anti-psychotics in the past.  Social history: Patient gets a disability check. Unclear where he'll go to stay now. He says that he left the shelter voluntarily it's possible that he may have actually been asked to leave. He is to stay with his mother over in [place] but says that that's not always a very stable situation either. He feels confident that he can find a place to stay.  Medical history: Has chronic pain in his left knee from some swelling after having had surgery in the past. Also has a history of use of inhaled steroid-induced for COPD and medicine for cholesterol but is not taking anything else right now.  Family history: Says that there is extensive mental health problems on both sides of his family mostly bipolar disorder and substance abuse.  Substance abuse history: He claims that he had been sober for the past 11 years until yesterday. He did  not present a positive alcohol level. Does have a drug screen positive for marijuana and admits that he uses marijuana daily. HPI Elements:   Quality:  Bad mood, irritability. Severity:  Mild to moderate. Timing:  Chronic not clear that it's been any worse recently. Duration:  Long-standing problem. Context:  Recent homelessness.  Past Medical History:  Past Medical History  Diagnosis Date  . Schizophrenia   . Bipolar 1 disorder   . COPD (chronic obstructive pulmonary  disease)   . Hypertension   . High cholesterol   . GERD (gastroesophageal reflux disease)     Past Surgical History  Procedure Laterality Date  . Cervical spine surgery     Family History: No family history on file. Social History:  History  Alcohol Use No     History  Drug Use No    History   Social History  . Marital Status: Single    Spouse Name: N/A  . Number of Children: N/A  . Years of Education: N/A   Social History Main Topics  . Smoking status: Current Every Day Smoker  . Smokeless tobacco: Never Used  . Alcohol Use: No  . Drug Use: No  . Sexual Activity: Not on file   Other Topics Concern  . None   Social History Narrative   Additional Social History:    History of alcohol / drug use?: Yes Name of Substance 1: Marijuana 1 - Amount (size/oz): "3 bowls" 1 - Last Use / Amount: "4 days ago"                   Allergies:   Allergies  Allergen Reactions  . Acetaminophen Other (See Comments)    Pt states that it makes him hyper.     Labs:  Results for orders placed or performed during the hospital encounter of 07/28/14 (from the past 48 hour(s))  Acetaminophen level     Status: Abnormal   Collection Time: 07/28/14  6:23 PM  Result Value Ref Range   Acetaminophen (Tylenol), Serum <10 (L) 10 - 30 ug/mL    Comment:        THERAPEUTIC CONCENTRATIONS VARY SIGNIFICANTLY. A RANGE OF 10-30 ug/mL MAY BE AN EFFECTIVE CONCENTRATION FOR MANY PATIENTS. HOWEVER, SOME ARE BEST TREATED AT CONCENTRATIONS OUTSIDE THIS RANGE. ACETAMINOPHEN CONCENTRATIONS >150 ug/mL AT 4 HOURS AFTER INGESTION AND >50 ug/mL AT 12 HOURS AFTER INGESTION ARE OFTEN ASSOCIATED WITH TOXIC REACTIONS.   CBC     Status: Abnormal   Collection Time: 07/28/14  6:23 PM  Result Value Ref Range   WBC 11.7 (H) 3.8 - 10.6 K/uL   RBC 4.68 4.40 - 5.90 MIL/uL   Hemoglobin 14.4 13.0 - 18.0 g/dL   HCT 41.9 40.0 - 52.0 %   MCV 89.5 80.0 - 100.0 fL   MCH 30.8 26.0 - 34.0 pg   MCHC 34.4  32.0 - 36.0 g/dL   RDW 14.1 11.5 - 14.5 %   Platelets 291 150 - 440 K/uL  Comprehensive metabolic panel     Status: Abnormal   Collection Time: 07/28/14  6:23 PM  Result Value Ref Range   Sodium 130 (L) 135 - 145 mmol/L   Potassium 2.4 (LL) 3.5 - 5.1 mmol/L    Comment: CRITICAL RESULT CALLED TO, READ BACK BY AND VERIFIED WITH  BETH BRANDEL AT 1950 07/28/14 SDR    Chloride 100 (L) 101 - 111 mmol/L   CO2 22 22 - 32 mmol/L   Glucose, Bld 106 (H) 65 -  99 mg/dL   BUN 8 6 - 20 mg/dL   Creatinine, Ser 0.92 0.61 - 1.24 mg/dL   Calcium 8.4 (L) 8.9 - 10.3 mg/dL   Total Protein 7.9 6.5 - 8.1 g/dL   Albumin 4.4 3.5 - 5.0 g/dL   AST 49 (H) 15 - 41 U/L   ALT 32 17 - 63 U/L   Alkaline Phosphatase 66 38 - 126 U/L   Total Bilirubin 0.3 0.3 - 1.2 mg/dL   GFR calc non Af Amer >60 >60 mL/min   GFR calc Af Amer >60 >60 mL/min    Comment: (NOTE) The eGFR has been calculated using the CKD EPI equation. This calculation has not been validated in all clinical situations. eGFR's persistently <60 mL/min signify possible Chronic Kidney Disease.    Anion gap 8 5 - 15  Ethanol (ETOH)     Status: Abnormal   Collection Time: 07/28/14  6:23 PM  Result Value Ref Range   Alcohol, Ethyl (B) 101 (H) <5 mg/dL    Comment:        LOWEST DETECTABLE LIMIT FOR SERUM ALCOHOL IS 5 mg/dL FOR MEDICAL PURPOSES ONLY   Salicylate level     Status: None   Collection Time: 07/28/14  6:23 PM  Result Value Ref Range   Salicylate Lvl <1.7 2.8 - 30.0 mg/dL  Urine Drug Screen, Qualitative ([facility] only)     Status: Abnormal   Collection Time: 07/28/14  6:55 PM  Result Value Ref Range   Tricyclic, Ur Screen POSITIVE (A) NONE DETECTED   Amphetamines, Ur Screen NONE DETECTED NONE DETECTED   MDMA (Ecstasy)Ur Screen NONE DETECTED NONE DETECTED   Cocaine Metabolite,Ur [place] NONE DETECTED NONE DETECTED   Opiate, Ur Screen NONE DETECTED NONE DETECTED   Phencyclidine (PCP) Ur S NONE DETECTED NONE DETECTED   Cannabinoid 50 Ng, Ur [place]  POSITIVE (A) NONE DETECTED   Barbiturates, Ur Screen NONE DETECTED NONE DETECTED   Benzodiazepine, Ur Scrn NONE DETECTED NONE DETECTED   Methadone Scn, Ur NONE DETECTED NONE DETECTED    Comment: (NOTE) 510  Tricyclics, urine               Cutoff 1000 ng/mL 200  Amphetamines, urine             Cutoff 1000 ng/mL 300  MDMA (Ecstasy), urine           Cutoff 500 ng/mL 400  Cocaine Metabolite, urine       Cutoff 300 ng/mL 500  Opiate, urine                   Cutoff 300 ng/mL 600  Phencyclidine (PCP), urine      Cutoff 25 ng/mL 700  Cannabinoid, urine              Cutoff 50 ng/mL 800  Barbiturates, urine             Cutoff 200 ng/mL 900  Benzodiazepine, urine           Cutoff 200 ng/mL 1000 Methadone, urine                Cutoff 300 ng/mL 1100 1200 The urine drug screen provides only a preliminary, unconfirmed 1300 analytical test result and should not be used for non-medical 1400 purposes. Clinical consideration and professional judgment should 1500 be applied to any positive drug screen result due to possible 1600 interfering substances. A more specific alternate chemical method 1700 must be used in order to obtain  a confirmed analytical result.  1800 Gas chromato graphy / mass spectrometry (GC/MS) is the preferred 1900 confirmatory method.   Urinalysis complete, with microscopic ([facility] only)     Status: Abnormal   Collection Time: 07/28/14  6:55 PM  Result Value Ref Range   Color, Urine STRAW (A) YELLOW   APPearance CLEAR (A) CLEAR   Glucose, UA NEGATIVE NEGATIVE mg/dL   Bilirubin Urine NEGATIVE NEGATIVE   Ketones, ur NEGATIVE NEGATIVE mg/dL   Specific Gravity, Urine 1.005 1.005 - 1.030   Hgb urine dipstick NEGATIVE NEGATIVE   pH 5.0 5.0 - 8.0   Protein, ur NEGATIVE NEGATIVE mg/dL   Nitrite NEGATIVE NEGATIVE   Leukocytes, UA NEGATIVE NEGATIVE   RBC / HPF NONE SEEN 0 - 5 RBC/hpf   WBC, UA 0-5 0 - 5 WBC/hpf   Bacteria, UA NONE SEEN NONE SEEN   Squamous Epithelial / LPF NONE SEEN  NONE SEEN  Potassium     Status: None   Collection Time: 07/29/14  5:11 AM  Result Value Ref Range   Potassium 3.7 3.5 - 5.1 mmol/L    Vitals: Blood pressure 110/85, pulse 94, temperature 98.1 F (36.7 C), temperature source Oral, resp. rate 20, height 6' 1"  (1.854 m), weight 106.595 kg (235 lb), SpO2 96 %.  Risk to Self: Suicidal Ideation: No Suicidal Intent: No Is patient at risk for suicide?: No Suicidal Plan?: No Access to Means: No What has been your use of drugs/alcohol within the last 12 months?: Marijuana How many times?: 0 Other Self Harm Risks: 0 Triggers for Past Attempts: None known Intentional Self Injurious Behavior: None Risk to Others: Homicidal Ideation: No (Pt states "I don't want to harm nobody") Thoughts of Harm to Others: No Current Homicidal Intent: No Current Homicidal Plan: No Access to Homicidal Means: No Identified Victim: None reported History of harm to others?: Yes (Per previous assessment pt verbally threatened psych MD) Assessment of Violence: In past 6-12 months Violent Behavior Description: None reported Does patient have access to weapons?: No Criminal Charges Pending?: No Does patient have a court date: No Prior Inpatient Therapy: Prior Inpatient Therapy: Yes Prior Therapy Dates: 06/2014 Prior Therapy Facilty/Provider(s):  [facility]) Reason for Treatment: Depression Bipolar Prior Outpatient Therapy: Prior Outpatient Therapy: Yes Prior Therapy Dates: Current Prior Therapy Facilty/Provider(s): [facility]; [employer] [place], [place] Reason for Treatment: Depression Bipolar Does patient have an ACCT team?: No Does patient have Intensive In-House Services?  : No Does patient have [facility] services? : No Does patient have P4[facility] services?: No  Current Facility-Administered Medications  Medication Dose Route Frequency Provider Last Rate Last Dose  . nicotine (NICOTROL) 10 MG inhaler 1 continuous puffing  1 continuous puffing Inhalation PRN Gregor Hams, MD    1 continuous puffing at 07/29/14 0446  . pregabalin (LYRICA) capsule 200 mg  200 mg Oral TID Orbie Pyo, MD   200 mg at 07/29/14 1618  . QUEtiapine (SEROQUEL) tablet 100 mg  100 mg Oral QHS Orbie Pyo, MD      . QUEtiapine (SEROQUEL) tablet 50 mg  50 mg Oral TID Orbie Pyo, MD   50 mg at 07/29/14 1618   Current Outpatient Prescriptions  Medication Sig Dispense Refill  . albuterol (PROVENTIL HFA;VENTOLIN HFA) 108 (90 BASE) MCG/ACT inhaler Inhale 1 puff into the lungs every 6 (six) hours as needed for wheezing or shortness of breath.    Marland Kitchen atorvastatin (LIPITOR) 20 MG tablet Take 20 mg by mouth daily.    . beclomethasone (  QVAR) 40 MCG/ACT inhaler Inhale 2 puffs into the lungs 2 (two) times daily.    . diclofenac (VOLTAREN) 75 MG EC tablet Take 75 mg by mouth 2 (two) times daily.    . DULoxetine (CYMBALTA) 60 MG capsule Take 60 mg by mouth daily.    . hydrochlorothiazide (HYDRODIURIL) 25 MG tablet Take 25 mg by mouth daily.    Marland Kitchen omeprazole (PRILOSEC) 20 MG capsule Take 20 mg by mouth daily.    . potassium chloride (K-DUR) 10 MEQ tablet Take 20 mEq by mouth daily.    . pregabalin (LYRICA) 200 MG capsule Take 200 mg by mouth 3 (three) times daily.     . QUEtiapine (SEROQUEL) 50 MG tablet Take 25-50 mg by mouth 4 (four) times daily. Pt takes one tablet three times a day and two tablets at bedtime.    . Triamcinolone Acetonide (AZMACORT IN) Inhale 1 puff into the lungs 2 (two) times daily as needed (for shortness of breath).    Marland Kitchen oxyCODONE (ROXICODONE) 5 MG immediate release tablet Take 1 tablet (5 mg total) by mouth 2 (two) times daily. (Patient not taking: Reported on 07/28/2014) 8 tablet 0  . oxyCODONE-acetaminophen (PERCOCET) 5-325 MG per tablet Take 1 tablet by mouth every 4 (four) hours as needed for severe pain. (Patient not taking: Reported on 07/28/2014) 20 tablet 0    Musculoskeletal: Strength & Muscle Tone: within normal limits Gait & Station:  normal Patient leans: N/A  Psychiatric Specialty Exam: Physical Exam  Constitutional: He appears well-developed and well-nourished.  HENT:  Head: Normocephalic and atraumatic.  Eyes: Conjunctivae are normal. Pupils are equal, round, and reactive to light.  Neck: Normal range of motion.  Cardiovascular: Normal heart sounds.   Respiratory: Effort normal.  GI: Soft.  Musculoskeletal: Normal range of motion.       Legs: Neurological: He is alert.  Skin: Skin is warm and dry.  Psychiatric: His speech is normal and behavior is normal. Thought content normal. His mood appears anxious. Cognition and memory are normal. He expresses impulsivity.  Disheveled gentleman looks like he's been living outdoors. Actually presents with a fairly normal mental status exam. Not confused not psychotic. Denies any suicidal or homicidal ideation and denies any hallucinations. Appears to be cognitively intact    Review of Systems  Constitutional: Positive for malaise/fatigue.  HENT: Negative.   Eyes: Negative.   Respiratory: Negative.   Cardiovascular: Negative.   Gastrointestinal: Negative.   Musculoskeletal: Positive for joint pain.  Skin: Negative.   Neurological: Negative.   Psychiatric/Behavioral: Positive for substance abuse. Negative for depression, suicidal ideas and hallucinations. The patient has insomnia. The patient is not nervous/anxious.     Blood pressure 110/85, pulse 94, temperature 98.1 F (36.7 C), temperature source Oral, resp. rate 20, height 6' 1"  (1.854 m), weight 106.595 kg (235 lb), SpO2 96 %.Body mass index is 31.01 kg/(m^2).  General Appearance: Disheveled  Eye Contact::  Good  Speech:  Normal Rate  Volume:  Normal  Mood:  Anxious  Affect:  Full Range  Thought Process:  Goal Directed  Orientation:  Full (Time, Place, and Person)  Thought Content:  Negative  Suicidal Thoughts:  No  Homicidal Thoughts:  No  Memory:  Immediate;   Good Recent;   Poor Remote;   Fair   Judgement:  Fair  Insight:  Fair  Psychomotor Activity:  Normal  Concentration:  Fair  Recall:  [employer] [place]  Language: Fair  Akathisia:  No  Handed:  Right  AIMS (if indicated):     Assets:  Warehouse manager Resources/Insurance Resilience  ADL's:  Intact  Cognition: WNL  Sleep:      Medical Decision Making: Established Problem, Stable/Improving (1), Review of Psycho-Social Stressors (1), Review or order clinical lab tests (1) and Review of Medication Regimen & Side Effects (2)  Treatment Plan Summary: Medication management and Plan Patient is not presenting with any suicidal or homicidal ideation and he is not presenting with any acute psychotic symptoms. His affect is full range and calm and he has not been disruptive or bizarre in the emergency room. From his own history it's pretty clear that what he is mostly interested in his getting a room indoors rather than having to stay outdoors again. Patient does not currently have any indication for inpatient hospital treatment. He Artie has follow-up in the community with an outpatient psychiatrist. Psychoeducation and supportive counseling completed. Patient is encouraged to stay on his medicine and to try taking a lower dose of say 100-200 mg of the Seroquel regularly at night until he gets use to it. Strongly encouraged not to drink alcohol and not to use marijuana or other drugs. Case discussed with emergency room doctor. IVC discontinued. Patient can be released from the emergency room.  Plan:  No evidence of imminent risk to self or others at present.   Patient does not meet criteria for psychiatric inpatient admission. Supportive therapy provided about ongoing stressors. Disposition: Discontinue involuntary commitment. No new prescriptions as he Artie has his outpatient medicine. Released from emergency room.    07/29/2014 6:32 PM

## 2014-07-29 NOTE — Discharge Instructions (Signed)
You have been seen in the Emergency Department (ED) today for a psychiatric complaint.  You have been evaluated by psychiatry and we believe you are safe to be discharged from the hospital.   ° °Please return to the ED immediately if you have ANY thoughts of hurting yourself or anyone else, so that we may help you. ° °Please avoid alcohol and drug use. ° °Follow up with your doctor and/or therapist as soon as possible regarding today's ED visit.   Please follow up any other recommendations and clinic appointments provided by the psychiatry team that saw you in the Emergency Department. ° ° °Bipolar Disorder °Bipolar disorder is a mental illness. The term bipolar disorder actually is used to describe a group of disorders that all share varying degrees of emotional highs and lows that can interfere with daily functioning, such as work, school, or relationships. Bipolar disorder also can lead to drug abuse, hospitalization, and suicide. °The emotional highs of bipolar disorder are periods of elation or irritability and high energy. These highs can range from a mild form (hypomania) to a severe form (mania). People experiencing episodes of hypomania may appear energetic, excitable, and highly productive. People experiencing mania may behave impulsively or erratically. They often make poor decisions. They may have difficulty sleeping. The most severe episodes of mania can involve having very distorted beliefs or perceptions about the world and seeing or hearing things that are not real (psychotic delusions and hallucinations).  °The emotional lows of bipolar disorder (depression) also can range from mild to severe. Severe episodes of bipolar depression can involve psychotic delusions and hallucinations. °Sometimes people with bipolar disorder experience a state of mixed mood. Symptoms of hypomania or mania and depression are both present during this mixed-mood episode. °SIGNS AND SYMPTOMS °There are signs and symptoms of  the episodes of hypomania and mania as well as the episodes of depression. The signs and symptoms of hypomania and mania are similar but vary in severity. They include: °· Inflated self-esteem or feeling of increased self-confidence. °· Decreased need for sleep. °· Unusual talkativeness (rapid or pressured speech) or the feeling of a need to keep talking. °· Sensation of racing thoughts or constant talking, with quick shifts between topics that may or may not be related (flight of ideas). °· Decreased ability to focus or concentrate. °· Increased purposeful activity, such as work, studies, or social activity, or nonproductive activity, such as pacing, squirming and fidgeting, or finger and toe tapping. °· Impulsive behavior and use of poor judgment, resulting in high-risk activities, such as having unprotected sex or spending excessive amounts of money. °Signs and symptoms of depression include the following:  °· Feelings of sadness, hopelessness, or helplessness. °· Frequent or uncontrollable episodes of crying. °· Lack of feeling anything or caring about anything. °· Difficulty sleeping or sleeping too much.  °· Inability to enjoy the things you used to enjoy.   °· Desire to be alone all the time.   °· Feelings of guilt or worthlessness.  °· Lack of energy or motivation.   °· Difficulty concentrating, remembering, or making decisions.  °· Change in appetite or weight beyond normal fluctuations. °· Thoughts of death or the desire to harm yourself. °DIAGNOSIS  °Bipolar disorder is diagnosed through an assessment by your caregiver. Your caregiver will ask questions about your emotional episodes. There are two main types of bipolar disorder. People with type I bipolar disorder have manic episodes with or without depressive episodes. People with type II bipolar disorder have hypomanic episodes and major depressive   episodes, which are more serious than mild depression. The type of bipolar disorder you have can make an  important difference in how your illness is monitored and treated. °Your caregiver may ask questions about your medical history and use of alcohol or drugs, including prescription medication. Certain medical conditions and substances also can cause emotional highs and lows that resemble bipolar disorder (secondary bipolar disorder).  °TREATMENT  °Bipolar disorder is a long-term illness. It is best controlled with continuous treatment rather than treatment only when symptoms occur. The following treatments can be prescribed for bipolar disorders: °· Medication--Medication can be prescribed by a doctor that is an expert in treating mental disorders (psychiatrists). Medications called mood stabilizers are usually prescribed to help control the illness. Other medications are sometimes added if symptoms of mania, depression, or psychotic delusions and hallucinations occur despite the use of a mood stabilizer. °· Talk therapy--Some forms of talk therapy are helpful in providing support, education, and guidance. °A combination of medication and talk therapy is best for managing the disorder over time. A procedure in which electricity is applied to your brain through your scalp (electroconvulsive therapy) is used in cases of severe mania when medication and talk therapy do not work or work too slowly. °Document Released: 05/09/2000 Document Revised: 05/28/2012 Document Reviewed: 02/27/2012 °[employer]® Patient Information ©2015 [employer]. This information is not intended to replace advice given to you by your health care provider. Make sure you discuss any questions you have with your health care provider. ° °

## 2014-07-29 NOTE — ED Notes (Signed)
BEHAVIORAL HEALTH ROUNDING Patient sleeping: No. Patient alert and oriented: yes Behavior appropriate: Yes.   Nutrition and fluids offered: Yes  Toileting and hygiene offered: Yes  Sitter present: q15 min observations Law enforcement present: Yes [employer] 
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
## 2014-07-29 NOTE — ED Notes (Signed)
Dr. Toni Amend notified of pts statement and anger

## 2014-07-29 NOTE — ED Notes (Signed)
BEHAVIORAL HEALTH ROUNDING Patient sleeping: No. Patient alert and oriented: yes Behavior appropriate: Yes.   Nutrition and fluids offered: Yes  Toileting and hygiene offered: Yes  Sitter present: yes Law enforcement present: Yes   Pt given dinner tray

## 2014-07-29 NOTE — ED Notes (Signed)
BEHAVIORAL HEALTH ROUNDING Patient sleeping: Yes.   Patient alert and oriented: yes Behavior appropriate: Yes.  ;  Nutrition and fluids offered: Yes  Toileting and hygiene offered: Yes  Sitter present: yes Law enforcement present: Yes   ENVIRONMENTAL ASSESSMENT Potentially harmful objects out of patient reach: Yes.   Personal belongings secured: Yes.   Patient dressed in hospital provided attire only: Yes.   Plastic bags out of patient reach: Yes.   Patient care equipment (cords, cables, call bells, lines, and drains) shortened, removed, or accounted for: Yes.   Equipment and supplies removed from bottom of stretcher: Yes.   Potentially toxic materials out of patient reach: Yes.   Sharps container removed or out of patient reach: Yes.     

## 2014-12-18 ENCOUNTER — Ambulatory Visit (INDEPENDENT_AMBULATORY_CARE_PROVIDER_SITE_OTHER): Payer: Medicaid Other | Admitting: Family Medicine

## 2014-12-18 ENCOUNTER — Encounter: Payer: Self-pay | Admitting: Family Medicine

## 2014-12-18 VITALS — BP 112/78 | HR 94 | Temp 98.5°F | Resp 18 | Wt 240.7 lb

## 2014-12-18 DIAGNOSIS — I10 Essential (primary) hypertension: Secondary | ICD-10-CM | POA: Diagnosis not present

## 2014-12-18 DIAGNOSIS — E7800 Pure hypercholesterolemia, unspecified: Secondary | ICD-10-CM

## 2014-12-18 DIAGNOSIS — K219 Gastro-esophageal reflux disease without esophagitis: Secondary | ICD-10-CM

## 2014-12-18 DIAGNOSIS — J449 Chronic obstructive pulmonary disease, unspecified: Secondary | ICD-10-CM | POA: Diagnosis not present

## 2014-12-18 DIAGNOSIS — Z23 Encounter for immunization: Secondary | ICD-10-CM

## 2014-12-18 MED ORDER — HYDROCHLOROTHIAZIDE 25 MG PO TABS: 25.0000 mg | ORAL_TABLET | Freq: Every day | ORAL | Status: DC

## 2014-12-18 MED ORDER — OMEPRAZOLE 20 MG PO CPDR: 20.0000 mg | DELAYED_RELEASE_CAPSULE | Freq: Every day | ORAL | Status: DC

## 2014-12-18 MED ORDER — ALBUTEROL SULFATE HFA 108 (90 BASE) MCG/ACT IN AERS: 1.0000 | INHALATION_SPRAY | Freq: Four times a day (QID) | RESPIRATORY_TRACT | Status: DC | PRN

## 2014-12-18 MED ORDER — ATORVASTATIN CALCIUM 20 MG PO TABS: 20.0000 mg | ORAL_TABLET | Freq: Every day | ORAL | Status: DC

## 2014-12-18 MED ORDER — BECLOMETHASONE DIPROPIONATE 40 MCG/ACT IN AERS: 2.0000 | INHALATION_SPRAY | Freq: Two times a day (BID) | RESPIRATORY_TRACT | Status: DC

## 2014-12-18 MED ORDER — POTASSIUM CHLORIDE ER 10 MEQ PO TBCR: 20.0000 meq | EXTENDED_RELEASE_TABLET | Freq: Every day | ORAL | Status: DC
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
## 2015-02-03 ENCOUNTER — Ambulatory Visit: Payer: Self-pay | Admitting: Family Medicine

## 2015-02-26 ENCOUNTER — Encounter: Payer: Self-pay | Admitting: *Deleted

## 2015-02-26 ENCOUNTER — Emergency Department: Payer: Medicaid Other

## 2015-02-26 ENCOUNTER — Emergency Department
Admission: EM | Admit: 2015-02-26 | Discharge: 2015-02-26 | Disposition: A | Discharge status: Home / Self Care | Payer: Medicaid Other | Attending: Emergency Medicine | Admitting: Emergency Medicine

## 2015-02-26 DIAGNOSIS — Z79891 Long term (current) use of opiate analgesic: Secondary | ICD-10-CM | POA: Insufficient documentation

## 2015-02-26 DIAGNOSIS — I10 Essential (primary) hypertension: Secondary | ICD-10-CM | POA: Diagnosis not present

## 2015-02-26 DIAGNOSIS — Z7982 Long term (current) use of aspirin: Secondary | ICD-10-CM | POA: Insufficient documentation

## 2015-02-26 DIAGNOSIS — S8992XA Unspecified injury of left lower leg, initial encounter: Secondary | ICD-10-CM | POA: Diagnosis present

## 2015-02-26 DIAGNOSIS — Y9301 Activity, walking, marching and hiking: Secondary | ICD-10-CM | POA: Diagnosis not present

## 2015-02-26 DIAGNOSIS — Y998 Other external cause status: Secondary | ICD-10-CM | POA: Insufficient documentation

## 2015-02-26 DIAGNOSIS — Y9289 Other specified places as the place of occurrence of the external cause: Secondary | ICD-10-CM | POA: Insufficient documentation

## 2015-02-26 DIAGNOSIS — S8002XA Contusion of left knee, initial encounter: Secondary | ICD-10-CM | POA: Diagnosis not present

## 2015-02-26 DIAGNOSIS — W000XXA Fall on same level due to ice and snow, initial encounter: Secondary | ICD-10-CM | POA: Insufficient documentation

## 2015-02-26 DIAGNOSIS — F17210 Nicotine dependence, cigarettes, uncomplicated: Secondary | ICD-10-CM | POA: Diagnosis not present

## 2015-02-26 DIAGNOSIS — Z7951 Long term (current) use of inhaled steroids: Secondary | ICD-10-CM | POA: Diagnosis not present

## 2015-02-26 DIAGNOSIS — Z791 Long term (current) use of non-steroidal anti-inflammatories (NSAID): Secondary | ICD-10-CM | POA: Insufficient documentation

## 2015-02-26 MED ORDER — OXYCODONE HCL 5 MG PO TABS: 5.0000 mg | ORAL_TABLET | ORAL | Status: DC | PRN

## 2015-02-26 MED ORDER — MELOXICAM 15 MG PO TABS: 15.0000 mg | ORAL_TABLET | Freq: Every day | ORAL | Status: DC

## 2015-02-26 MED ORDER — ONDANSETRON 4 MG PO TBDP: 4.0000 mg | ORAL_TABLET | Freq: Three times a day (TID) | ORAL | Status: DC | PRN

## 2015-02-26 MED ORDER — TRAMADOL HCL 50 MG PO TABS
50.0000 mg | ORAL_TABLET | Freq: Once | ORAL | Status: AC
  Administered 2015-02-26: 50 mg via ORAL
  Filled 2015-02-26: qty 1
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
## 2015-02-26 NOTE — ED Notes (Signed)
Several days ago has fallen on ice, c/o pain left knee
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
## 2015-02-26 NOTE — Discharge Instructions (Signed)
Periosteal Hematoma Periosteal hematoma (bone bruise) is a localized, tender, raised area close to the bone. It can occur from a small hidden fracture of the bone, following surgery, or from other trauma to the area. It typically occurs in bones located close to the surface of the skin, such as the shin, knee, and heel bone. Although it may take 2 or more weeks to completely heal, bone bruises typically are not associated with permanent or serious damage to the bone. If you are taking blood thinners, you may be at greater risk for such injuries.  CAUSES  A bone bruise is usually caused by high-impact trauma to the bone, but it can be caused by sports injuries or twisting injuries. SIGNS AND SYMPTOMS   Severe pain around the injured area that typically lasts longer than a normal bruise.  Difficulty using the bruised area.  Tender, raised area close to the bone.  Discoloration or swelling of the bruised area. DIAGNOSIS  You may need an MRI of the injured area to confirm a bone bruise if your health care provider feels it is necessary. A regular X-ray will not detect a bone bruise, but it will detect a broken bone (fracture). An X-ray may be taken to rule out any fractures. TREATMENT  Often, the best treatment for a bone bruise is resting, icing, and applying cold compresses to the injured area. Over-the-counter medicines may also be recommended for pain control. HOME CARE INSTRUCTIONS  Some things you can do to improve the condition are:   Rest and elevate the area of injury as long as it is very tender or swollen.  Apply ice to the injured area:  Put ice in a plastic bag.  Place a towel between your skin and the bag.  Leave the ice on for 20 minutes, 2-3 times a day.  Use an elastic wrap to reduce swelling and protect the injured area. Make sure it is not applied too tightly. If the area around the wrap becomes cold or blue, the wrap is too tight. Wrap it more loosely.  For  activity:  Follow your health care provider's instructions about whether walking with crutches is required. This will depend on how serious your condition is.  Start weight bearing gradually on the bruised part.  Continue to use crutches or a cane until you can stand without causing pain, or as instructed.  If a plaster splint was applied:  Wear the splint until you are seen for a follow-up exam.  Rest it on nothing harder than a pillow the first 24 hours.  Do not put weight on it.  Do not get it wet. You may take it off to take a shower or bath.  You may have been given an elastic bandage to use with or without the plaster splint. The splint is too tight if you have numbness or tingling, or if the skin around the bandage becomes cold and blue. Adjust the bandage to make it comfortable.  If an air splint was applied:  You may alter the amount of air in the splint as needed for comfort.  You may take it off at night and to take a shower or bath.  If the injury was in either leg, wiggle your toes in the splint several times per day if you are able.  Only take over-the-counter or prescription medicines for pain, discomfort, or fever as directed by your health care provider.  Keep all follow-up visits with your health care provider. This includes   any orthopedic referrals, physical therapy, and rehabilitation. Any delay in getting necessary care could result in a delay or failure of the bones to heal. SEEK MEDICAL CARE IF:   You have an increase in bruising, swelling, tenderness, heat, or pain over your injury.  You notice coldness of your toes that does not improve after removing a splint or bandage.  Your pain is not lessened after you take medicine.  You have increased difficulty bearing weight on the injured leg, if the injury is in either leg. SEEK IMMEDIATE MEDICAL CARE IF:   You have severe pain near the injured area or severe pain with stretching.  You have increased  swelling that resulted in a tense, hard area or a loss of sensation in the area of the injury.  You have pale, cool skin below the area of the injury (in an extremity) that does not go away after removing a splint or bandage. MAKE SURE YOU:   Understand these instructions.  Will watch your condition.  Will get help right away if you are not doing well or get worse.   This information is not intended to replace advice given to you by your health care provider. Make sure you discuss any questions you have with your health care provider.   Document Released: 03/10/2004 Document Revised: 11/21/2012 Document Reviewed: 07/20/2012 Elsevier Interactive Patient Education 2016 [employer]  

## 2015-03-06 ENCOUNTER — Ambulatory Visit (INDEPENDENT_AMBULATORY_CARE_PROVIDER_SITE_OTHER): Payer: Medicaid Other | Admitting: Family Medicine

## 2015-03-06 ENCOUNTER — Encounter: Payer: Self-pay | Admitting: Family Medicine

## 2015-03-06 VITALS — BP 122/94 | HR 135 | Temp 97.8°F | Resp 16 | Ht 73.0 in | Wt 235.0 lb

## 2015-03-06 DIAGNOSIS — M25562 Pain in left knee: Secondary | ICD-10-CM | POA: Diagnosis not present

## 2015-03-06 DIAGNOSIS — G8929 Other chronic pain: Secondary | ICD-10-CM

## 2015-03-06 MED ORDER — OXYCODONE HCL 5 MG PO TABS: 5.0000 mg | ORAL_TABLET | Freq: Four times a day (QID) | ORAL | Status: DC | PRN
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
## 2015-03-18 ENCOUNTER — Other Ambulatory Visit: Payer: Self-pay | Admitting: Family Medicine

## 2015-03-18 DIAGNOSIS — G8929 Other chronic pain: Secondary | ICD-10-CM

## 2015-03-18 DIAGNOSIS — M25562 Pain in left knee: Principal | ICD-10-CM

## 2015-03-18 MED ORDER — OXYCODONE HCL 5 MG PO TABS: 5.0000 mg | ORAL_TABLET | Freq: Four times a day (QID) | ORAL | Status: DC | PRN

## 2015-03-18 NOTE — Telephone Encounter (Signed)
Patient was referred to [facility], however they have not contacted him with the appointment. He took his last pain medication this morning and wanted to know if at all possible that he could get a refill at least enough until 03-25-15 in which he has a follow up visit with you.

## 2015-03-23 ENCOUNTER — Ambulatory Visit: Payer: Self-pay | Admitting: Family Medicine

## 2015-03-24 ENCOUNTER — Ambulatory Visit: Payer: Self-pay | Admitting: Family Medicine

## 2015-03-25 ENCOUNTER — Ambulatory Visit (INDEPENDENT_AMBULATORY_CARE_PROVIDER_SITE_OTHER): Payer: Medicaid Other | Admitting: Family Medicine

## 2015-03-25 ENCOUNTER — Encounter: Payer: Self-pay | Admitting: Family Medicine

## 2015-03-25 VITALS — BP 122/76 | HR 110 | Temp 98.6°F | Resp 16 | Ht 73.0 in | Wt 226.0 lb

## 2015-03-25 DIAGNOSIS — G8929 Other chronic pain: Secondary | ICD-10-CM

## 2015-03-25 DIAGNOSIS — M25562 Pain in left knee: Secondary | ICD-10-CM

## 2015-03-25 DIAGNOSIS — J449 Chronic obstructive pulmonary disease, unspecified: Secondary | ICD-10-CM | POA: Diagnosis not present

## 2015-03-25 DIAGNOSIS — K219 Gastro-esophageal reflux disease without esophagitis: Secondary | ICD-10-CM | POA: Diagnosis not present

## 2015-03-25 DIAGNOSIS — I10 Essential (primary) hypertension: Secondary | ICD-10-CM

## 2015-03-25 MED ORDER — MELOXICAM 15 MG PO TABS: 15.0000 mg | ORAL_TABLET | Freq: Every day | ORAL | Status: DC

## 2015-03-25 MED ORDER — BECLOMETHASONE DIPROPIONATE 40 MCG/ACT IN AERS: 2.0000 | INHALATION_SPRAY | Freq: Two times a day (BID) | RESPIRATORY_TRACT | Status: DC

## 2015-03-25 MED ORDER — OMEPRAZOLE 20 MG PO CPDR: 20.0000 mg | DELAYED_RELEASE_CAPSULE | Freq: Every day | ORAL | Status: DC

## 2015-03-25 MED ORDER — ALBUTEROL SULFATE HFA 108 (90 BASE) MCG/ACT IN AERS: 1.0000 | INHALATION_SPRAY | Freq: Four times a day (QID) | RESPIRATORY_TRACT | Status: DC | PRN

## 2015-03-25 MED ORDER — OXYCODONE HCL 5 MG PO TABS: 5.0000 mg | ORAL_TABLET | Freq: Four times a day (QID) | ORAL | Status: DC | PRN

## 2015-03-25 MED ORDER — HYDROCHLOROTHIAZIDE 25 MG PO TABS: 25.0000 mg | ORAL_TABLET | Freq: Every day | ORAL | Status: DC
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
## 2015-04-06 ENCOUNTER — Emergency Department: Payer: Medicaid Other

## 2015-04-06 ENCOUNTER — Emergency Department
Admission: EM | Admit: 2015-04-06 | Discharge: 2015-04-06 | Disposition: A | Discharge status: Home / Self Care | Payer: Medicaid Other | Attending: Emergency Medicine | Admitting: Emergency Medicine

## 2015-04-06 ENCOUNTER — Encounter: Payer: Self-pay | Admitting: Emergency Medicine

## 2015-04-06 DIAGNOSIS — F319 Bipolar disorder, unspecified: Secondary | ICD-10-CM | POA: Diagnosis not present

## 2015-04-06 DIAGNOSIS — F1210 Cannabis abuse, uncomplicated: Secondary | ICD-10-CM | POA: Diagnosis present

## 2015-04-06 DIAGNOSIS — Y9389 Activity, other specified: Secondary | ICD-10-CM | POA: Insufficient documentation

## 2015-04-06 DIAGNOSIS — S8000XA Contusion of unspecified knee, initial encounter: Secondary | ICD-10-CM | POA: Insufficient documentation

## 2015-04-06 DIAGNOSIS — F3160 Bipolar disorder, current episode mixed, unspecified: Secondary | ICD-10-CM

## 2015-04-06 DIAGNOSIS — Z79899 Other long term (current) drug therapy: Secondary | ICD-10-CM | POA: Diagnosis not present

## 2015-04-06 DIAGNOSIS — Y92038 Other place in apartment as the place of occurrence of the external cause: Secondary | ICD-10-CM | POA: Insufficient documentation

## 2015-04-06 DIAGNOSIS — Z791 Long term (current) use of non-steroidal anti-inflammatories (NSAID): Secondary | ICD-10-CM | POA: Diagnosis not present

## 2015-04-06 DIAGNOSIS — F17210 Nicotine dependence, cigarettes, uncomplicated: Secondary | ICD-10-CM | POA: Diagnosis not present

## 2015-04-06 DIAGNOSIS — R454 Irritability and anger: Secondary | ICD-10-CM | POA: Diagnosis not present

## 2015-04-06 DIAGNOSIS — I10 Essential (primary) hypertension: Secondary | ICD-10-CM | POA: Diagnosis not present

## 2015-04-06 DIAGNOSIS — F209 Schizophrenia, unspecified: Secondary | ICD-10-CM | POA: Diagnosis not present

## 2015-04-06 DIAGNOSIS — S80219A Abrasion, unspecified knee, initial encounter: Secondary | ICD-10-CM | POA: Diagnosis not present

## 2015-04-06 DIAGNOSIS — Y998 Other external cause status: Secondary | ICD-10-CM | POA: Insufficient documentation

## 2015-04-06 DIAGNOSIS — F99 Mental disorder, not otherwise specified: Secondary | ICD-10-CM | POA: Diagnosis present

## 2015-04-06 DIAGNOSIS — F1010 Alcohol abuse, uncomplicated: Secondary | ICD-10-CM

## 2015-04-06 DIAGNOSIS — R45850 Homicidal ideations: Secondary | ICD-10-CM

## 2015-04-06 DIAGNOSIS — Z7951 Long term (current) use of inhaled steroids: Secondary | ICD-10-CM | POA: Diagnosis not present

## 2015-04-06 DIAGNOSIS — Z7982 Long term (current) use of aspirin: Secondary | ICD-10-CM | POA: Insufficient documentation

## 2015-04-06 LAB — COMPREHENSIVE METABOLIC PANEL
ALBUMIN: 4.5 g/dL (ref 3.5–5.0)
ALK PHOS: 60 U/L (ref 38–126)
ALT: 59 U/L (ref 17–63)
AST: 47 U/L — AB (ref 15–41)
Anion gap: 10 (ref 5–15)
BILIRUBIN TOTAL: 0.4 mg/dL (ref 0.3–1.2)
CALCIUM: 8.7 mg/dL — AB (ref 8.9–10.3)
CO2: 25 mmol/L (ref 22–32)
CREATININE: 0.68 mg/dL (ref 0.61–1.24)
Chloride: 102 mmol/L (ref 101–111)
GFR calc Af Amer: >60 mL/min (ref >60)
GFR calc non Af Amer: >60 mL/min (ref >60)
GLUCOSE: 128 mg/dL — AB (ref 65–99)
POTASSIUM: 3 mmol/L — AB (ref 3.5–5.1)
Sodium: 137 mmol/L (ref 135–145)
TOTAL PROTEIN: 7.9 g/dL (ref 6.5–8.1)

## 2015-04-06 LAB — CBC
HEMATOCRIT: 45.5 % (ref 40.0–52.0)
Hemoglobin: 15.6 g/dL (ref 13.0–18.0)
MCH: 30 pg (ref 26.0–34.0)
MCHC: 34.4 g/dL (ref 32.0–36.0)
MCV: 87.1 fL (ref 80.0–100.0)
Platelets: 315 10*3/uL (ref 150–440)
RBC: 5.22 MIL/uL (ref 4.40–5.90)
RDW: 13.6 % (ref 11.5–14.5)
WBC: 11.2 10*3/uL — AB (ref 3.8–10.6)

## 2015-04-06 LAB — SALICYLATE LEVEL: Salicylate Lvl: <4 mg/dL (ref 2.8–30.0)

## 2015-04-06 LAB — ACETAMINOPHEN LEVEL: Acetaminophen (Tylenol), Serum: <10 ug/mL — ABNORMAL LOW (ref 10–30)

## 2015-04-06 LAB — ETHANOL: Alcohol, Ethyl (B): 277 mg/dL — ABNORMAL HIGH (ref <5)

## 2015-04-06 MED ORDER — ASPIRIN EC 81 MG PO TBEC: 81.0000 mg | DELAYED_RELEASE_TABLET | Freq: Every day | ORAL | Status: DC

## 2015-04-06 MED ORDER — BECLOMETHASONE DIPROPIONATE 40 MCG/ACT IN AERS: 2.0000 | INHALATION_SPRAY | Freq: Two times a day (BID) | RESPIRATORY_TRACT | Status: DC

## 2015-04-06 MED ORDER — QUETIAPINE FUMARATE 25 MG PO TABS
100.0000 mg | ORAL_TABLET | Freq: Four times a day (QID) | ORAL | Status: DC
  Administered 2015-04-06: 100 mg via ORAL
  Filled 2015-04-06: qty 4

## 2015-04-06 MED ORDER — OXYCODONE HCL 5 MG PO TABS
5.0000 mg | ORAL_TABLET | Freq: Four times a day (QID) | ORAL | Status: DC | PRN
  Administered 2015-04-06: 5 mg via ORAL
  Filled 2015-04-06: qty 1

## 2015-04-06 MED ORDER — PANTOPRAZOLE SODIUM 40 MG PO TBEC
40.0000 mg | DELAYED_RELEASE_TABLET | Freq: Every day | ORAL | Status: DC
  Administered 2015-04-06: 40 mg via ORAL
  Filled 2015-04-06: qty 1

## 2015-04-06 MED ORDER — DIAZEPAM 5 MG PO TABS: 5.0000 mg | ORAL_TABLET | Freq: Three times a day (TID) | ORAL | Status: DC | PRN

## 2015-04-06 MED ORDER — BUDESONIDE 0.25 MG/2ML IN SUSP: 0.2500 mg | Freq: Two times a day (BID) | RESPIRATORY_TRACT | Status: DC

## 2015-04-06 MED ORDER — ALBUTEROL SULFATE HFA 108 (90 BASE) MCG/ACT IN AERS
1.0000 | INHALATION_SPRAY | Freq: Four times a day (QID) | RESPIRATORY_TRACT | Status: DC | PRN
  Filled 2015-04-06: qty 6.7

## 2015-04-06 MED ORDER — MELOXICAM 15 MG PO TABS
15.0000 mg | ORAL_TABLET | Freq: Every day | ORAL | Status: DC
  Filled 2015-04-06: qty 1

## 2015-04-06 MED ORDER — HYDROCHLOROTHIAZIDE 25 MG PO TABS
25.0000 mg | ORAL_TABLET | Freq: Every day | ORAL | Status: DC
  Administered 2015-04-06: 25 mg via ORAL
  Filled 2015-04-06: qty 1

## 2015-04-06 MED ORDER — DULOXETINE HCL 60 MG PO CPEP
120.0000 mg | ORAL_CAPSULE | Freq: Every day | ORAL | Status: DC
  Administered 2015-04-06: 120 mg via ORAL
  Filled 2015-04-06: qty 2
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
## 2015-04-17 ENCOUNTER — Other Ambulatory Visit: Payer: Self-pay | Admitting: Specialist

## 2015-04-20 ENCOUNTER — Encounter
Admission: RE | Disposition: A | Discharge status: Home / Self Care | Payer: Self-pay | Source: Ambulatory Visit | Attending: Specialist

## 2015-04-20 ENCOUNTER — Ambulatory Visit: Payer: Medicaid Other | Admitting: Anesthesiology

## 2015-04-20 ENCOUNTER — Encounter: Payer: Self-pay | Admitting: *Deleted

## 2015-04-20 ENCOUNTER — Observation Stay
Admission: RE | Admit: 2015-04-20 | Discharge: 2015-04-21 | Disposition: A | Discharge status: Home / Self Care | Payer: Medicaid Other | Source: Ambulatory Visit | Attending: Specialist | Admitting: Specialist

## 2015-04-20 ENCOUNTER — Ambulatory Visit: Payer: Medicaid Other

## 2015-04-20 DIAGNOSIS — Z79899 Other long term (current) drug therapy: Secondary | ICD-10-CM | POA: Insufficient documentation

## 2015-04-20 DIAGNOSIS — T8484XA Pain due to internal orthopedic prosthetic devices, implants and grafts, initial encounter: Secondary | ICD-10-CM | POA: Diagnosis present

## 2015-04-20 DIAGNOSIS — Z7982 Long term (current) use of aspirin: Secondary | ICD-10-CM | POA: Diagnosis not present

## 2015-04-20 DIAGNOSIS — Y791 Therapeutic (nonsurgical) and rehabilitative orthopedic devices associated with adverse incidents: Secondary | ICD-10-CM | POA: Diagnosis not present

## 2015-04-20 DIAGNOSIS — T85698A Other mechanical complication of other specified internal prosthetic devices, implants and grafts, initial encounter: Secondary | ICD-10-CM | POA: Diagnosis not present

## 2015-04-20 DIAGNOSIS — Z791 Long term (current) use of non-steroidal anti-inflammatories (NSAID): Secondary | ICD-10-CM | POA: Insufficient documentation

## 2015-04-20 DIAGNOSIS — Z79891 Long term (current) use of opiate analgesic: Secondary | ICD-10-CM | POA: Insufficient documentation

## 2015-04-20 DIAGNOSIS — Z9889 Other specified postprocedural states: Secondary | ICD-10-CM

## 2015-04-20 HISTORY — PX: HARDWARE REMOVAL: SHX979

## 2015-04-20 LAB — CREATININE, SERUM
CREATININE: 0.91 mg/dL (ref 0.61–1.24)
GFR calc Af Amer: >60 mL/min (ref >60)
GFR calc non Af Amer: >60 mL/min (ref >60)

## 2015-04-20 LAB — CBC
HCT: 37.7 % — ABNORMAL LOW (ref 40.0–52.0)
HEMOGLOBIN: 12.9 g/dL — AB (ref 13.0–18.0)
MCH: 30.6 pg (ref 26.0–34.0)
MCHC: 34.2 g/dL (ref 32.0–36.0)
MCV: 89.6 fL (ref 80.0–100.0)
Platelets: 229 10*3/uL (ref 150–440)
RBC: 4.21 MIL/uL — AB (ref 4.40–5.90)
RDW: 14 % (ref 11.5–14.5)
WBC: 13.2 10*3/uL — ABNORMAL HIGH (ref 3.8–10.6)

## 2015-04-20 SURGERY — REMOVAL, HARDWARE
Anesthesia: General | Laterality: Left

## 2015-04-20 MED ORDER — ALBUTEROL SULFATE (2.5 MG/3ML) 0.083% IN NEBU: 2.5000 mg | INHALATION_SOLUTION | Freq: Four times a day (QID) | RESPIRATORY_TRACT | Status: DC | PRN

## 2015-04-20 MED ORDER — DIAZEPAM 5 MG PO TABS
5.0000 mg | ORAL_TABLET | Freq: Every evening | ORAL | Status: DC | PRN
  Administered 2015-04-20: 5 mg via ORAL
  Filled 2015-04-20: qty 1

## 2015-04-20 MED ORDER — PANTOPRAZOLE SODIUM 40 MG PO TBEC
40.0000 mg | DELAYED_RELEASE_TABLET | Freq: Every day | ORAL | Status: DC
  Administered 2015-04-20 – 2015-04-21 (×2): 40 mg via ORAL
  Filled 2015-04-20 (×2): qty 1

## 2015-04-20 MED ORDER — BUPIVACAINE HCL (PF) 0.5 % IJ SOLN
INTRAMUSCULAR | Status: AC
  Filled 2015-04-20: qty 30

## 2015-04-20 MED ORDER — MAGNESIUM HYDROXIDE 400 MG/5ML PO SUSP: 30.0000 mL | Freq: Every day | ORAL | Status: DC | PRN

## 2015-04-20 MED ORDER — MELOXICAM 7.5 MG PO TABS
15.0000 mg | ORAL_TABLET | Freq: Every day | ORAL | Status: DC
  Administered 2015-04-20 – 2015-04-21 (×2): 15 mg via ORAL
  Filled 2015-04-20 (×2): qty 2

## 2015-04-20 MED ORDER — BUDESONIDE 0.25 MG/2ML IN SUSP
0.2500 mg | Freq: Two times a day (BID) | RESPIRATORY_TRACT | Status: DC
  Administered 2015-04-20 – 2015-04-21 (×2): 0.25 mg via RESPIRATORY_TRACT
  Filled 2015-04-20 (×3): qty 2

## 2015-04-20 MED ORDER — HYDROCODONE-ACETAMINOPHEN 7.5-325 MG PO TABS
1.0000 | ORAL_TABLET | Freq: Four times a day (QID) | ORAL | Status: DC
  Filled 2015-04-20: qty 1

## 2015-04-20 MED ORDER — QUETIAPINE FUMARATE 100 MG PO TABS
100.0000 mg | ORAL_TABLET | Freq: Three times a day (TID) | ORAL | Status: DC
  Administered 2015-04-20 – 2015-04-21 (×2): 100 mg via ORAL
  Filled 2015-04-20: qty 1

## 2015-04-20 MED ORDER — METOCLOPRAMIDE HCL 5 MG/ML IJ SOLN: 5.0000 mg | Freq: Three times a day (TID) | INTRAMUSCULAR | Status: DC | PRN

## 2015-04-20 MED ORDER — HYDROCODONE-ACETAMINOPHEN 7.5-325 MG PO TABS: 1.0000 | ORAL_TABLET | ORAL | Status: DC | PRN

## 2015-04-20 MED ORDER — FLEET ENEMA 7-19 GM/118ML RE ENEM: 1.0000 | ENEMA | Freq: Once | RECTAL | Status: DC | PRN

## 2015-04-20 MED ORDER — LACTATED RINGERS IV SOLN
INTRAVENOUS | Status: DC | PRN
  Administered 2015-04-20: 13:00:00 via INTRAVENOUS

## 2015-04-20 MED ORDER — BUPIVACAINE HCL (PF) 0.5 % IJ SOLN
INTRAMUSCULAR | Status: DC | PRN
  Administered 2015-04-20: 30 mL

## 2015-04-20 MED ORDER — SODIUM CHLORIDE 0.45 % IV SOLN
INTRAVENOUS | Status: DC
  Administered 2015-04-20: 18:00:00 via INTRAVENOUS

## 2015-04-20 MED ORDER — OXYCODONE HCL 5 MG PO TABS
5.0000 mg | ORAL_TABLET | Freq: Four times a day (QID) | ORAL | Status: DC | PRN
  Administered 2015-04-20 – 2015-04-21 (×4): 5 mg via ORAL
  Filled 2015-04-20 (×4): qty 1

## 2015-04-20 MED ORDER — NICOTINE 21 MG/24HR TD PT24
21.0000 mg | MEDICATED_PATCH | Freq: Every day | TRANSDERMAL | Status: DC
  Administered 2015-04-20 – 2015-04-21 (×3): 21 mg via TRANSDERMAL
  Filled 2015-04-20 (×2): qty 1

## 2015-04-20 MED ORDER — ENOXAPARIN SODIUM 40 MG/0.4ML ~~LOC~~ SOLN
40.0000 mg | SUBCUTANEOUS | Status: DC
  Administered 2015-04-21: 40 mg via SUBCUTANEOUS
  Filled 2015-04-20: qty 0.4

## 2015-04-20 MED ORDER — CEFAZOLIN SODIUM-DEXTROSE 2-3 GM-% IV SOLR
INTRAVENOUS | Status: AC
  Filled 2015-04-20: qty 50

## 2015-04-20 MED ORDER — DIPHENHYDRAMINE HCL 25 MG PO CAPS
50.0000 mg | ORAL_CAPSULE | Freq: Every day | ORAL | Status: DC
  Administered 2015-04-20: 50 mg via ORAL
  Filled 2015-04-20: qty 2

## 2015-04-20 MED ORDER — PROMETHAZINE HCL 25 MG/ML IJ SOLN: 6.2500 mg | INTRAMUSCULAR | Status: DC | PRN

## 2015-04-20 MED ORDER — PROPOFOL 10 MG/ML IV BOLUS
INTRAVENOUS | Status: DC | PRN
  Administered 2015-04-20: 150 mg via INTRAVENOUS

## 2015-04-20 MED ORDER — GABAPENTIN 400 MG PO CAPS
400.0000 mg | ORAL_CAPSULE | Freq: Once | ORAL | Status: AC
  Administered 2015-04-20: 400 mg via ORAL
  Filled 2015-04-20: qty 1

## 2015-04-20 MED ORDER — DEXAMETHASONE SODIUM PHOSPHATE 10 MG/ML IJ SOLN
INTRAMUSCULAR | Status: DC | PRN
  Administered 2015-04-20: 10 mg via INTRAVENOUS

## 2015-04-20 MED ORDER — METOCLOPRAMIDE HCL 5 MG PO TABS: 5.0000 mg | ORAL_TABLET | Freq: Three times a day (TID) | ORAL | Status: DC | PRN

## 2015-04-20 MED ORDER — CEFAZOLIN SODIUM-DEXTROSE 2-3 GM-% IV SOLR
2.0000 g | Freq: Four times a day (QID) | INTRAVENOUS | Status: AC
  Administered 2015-04-20 – 2015-04-21 (×3): 2 g via INTRAVENOUS
  Filled 2015-04-20 (×3): qty 50

## 2015-04-20 MED ORDER — PNEUMOCOCCAL VAC POLYVALENT 25 MCG/0.5ML IJ INJ: 0.5000 mL | INJECTION | INTRAMUSCULAR | Status: DC

## 2015-04-20 MED ORDER — MORPHINE SULFATE (PF) 2 MG/ML IV SOLN
2.0000 mg | INTRAVENOUS | Status: DC | PRN
  Administered 2015-04-21: 2 mg via INTRAVENOUS
  Filled 2015-04-20: qty 1

## 2015-04-20 MED ORDER — CLINDAMYCIN PHOSPHATE 600 MG/50ML IV SOLN: 600.0000 mg | Freq: Three times a day (TID) | INTRAVENOUS | Status: DC

## 2015-04-20 MED ORDER — DULOXETINE HCL 60 MG PO CPEP
120.0000 mg | ORAL_CAPSULE | Freq: Every day | ORAL | Status: DC
  Administered 2015-04-20: 120 mg via ORAL
  Administered 2015-04-21: 60 mg via ORAL
  Filled 2015-04-20 (×2): qty 2

## 2015-04-20 MED ORDER — QUETIAPINE FUMARATE 100 MG PO TABS
100.0000 mg | ORAL_TABLET | Freq: Once | ORAL | Status: AC
  Administered 2015-04-20: 100 mg via ORAL
  Filled 2015-04-20: qty 1

## 2015-04-20 MED ORDER — ONDANSETRON HCL 4 MG/2ML IJ SOLN
INTRAMUSCULAR | Status: DC | PRN
  Administered 2015-04-20: 4 mg via INTRAVENOUS

## 2015-04-20 MED ORDER — QUETIAPINE FUMARATE 100 MG PO TABS
200.0000 mg | ORAL_TABLET | Freq: Every day | ORAL | Status: DC
  Administered 2015-04-20: 200 mg via ORAL
  Filled 2015-04-20 (×2): qty 2

## 2015-04-20 MED ORDER — FENTANYL CITRATE (PF) 100 MCG/2ML IJ SOLN
INTRAMUSCULAR | Status: DC | PRN
  Administered 2015-04-20 (×5): 50 ug via INTRAVENOUS

## 2015-04-20 MED ORDER — LIDOCAINE HCL (CARDIAC) 20 MG/ML IV SOLN
INTRAVENOUS | Status: DC | PRN
  Administered 2015-04-20: 10 mg via INTRAVENOUS

## 2015-04-20 MED ORDER — CEFAZOLIN SODIUM-DEXTROSE 2-3 GM-% IV SOLR
2.0000 g | INTRAVENOUS | Status: AC
  Administered 2015-04-20: 2 g via INTRAVENOUS

## 2015-04-20 MED ORDER — PREGABALIN 75 MG PO CAPS
75.0000 mg | ORAL_CAPSULE | Freq: Every day | ORAL | Status: DC
  Administered 2015-04-20: 75 mg via ORAL

## 2015-04-20 MED ORDER — CLINDAMYCIN PHOSPHATE 600 MG/50ML IV SOLN
INTRAVENOUS | Status: AC
  Administered 2015-04-20: 600 mg via INTRAVENOUS
  Filled 2015-04-20: qty 50

## 2015-04-20 MED ORDER — ACETAMINOPHEN 650 MG RE SUPP: 650.0000 mg | Freq: Four times a day (QID) | RECTAL | Status: DC | PRN

## 2015-04-20 MED ORDER — CLINDAMYCIN PHOSPHATE 900 MG/50ML IV SOLN
900.0000 mg | Freq: Three times a day (TID) | INTRAVENOUS | Status: AC
  Administered 2015-04-20 – 2015-04-21 (×3): 900 mg via INTRAVENOUS
  Filled 2015-04-20 (×3): qty 50

## 2015-04-20 MED ORDER — ACETAMINOPHEN 325 MG PO TABS: 650.0000 mg | ORAL_TABLET | Freq: Four times a day (QID) | ORAL | Status: DC | PRN

## 2015-04-20 MED ORDER — FENTANYL CITRATE (PF) 100 MCG/2ML IJ SOLN
INTRAMUSCULAR | Status: AC
  Administered 2015-04-20: 25 ug via INTRAVENOUS
  Filled 2015-04-20: qty 2

## 2015-04-20 MED ORDER — NEOMYCIN-POLYMYXIN B GU 40-200000 IR SOLN
Status: DC | PRN
  Administered 2015-04-20: 4 mL

## 2015-04-20 MED ORDER — MELOXICAM 7.5 MG PO TABS: 15.0000 mg | ORAL_TABLET | Freq: Every day | ORAL | Status: DC

## 2015-04-20 MED ORDER — HYDROCHLOROTHIAZIDE 25 MG PO TABS
25.0000 mg | ORAL_TABLET | Freq: Every day | ORAL | Status: DC
  Administered 2015-04-20 – 2015-04-21 (×2): 25 mg via ORAL
  Filled 2015-04-20 (×2): qty 1

## 2015-04-20 MED ORDER — SENNA 8.6 MG PO TABS
1.0000 | ORAL_TABLET | Freq: Two times a day (BID) | ORAL | Status: DC
  Administered 2015-04-20 – 2015-04-21 (×2): 8.6 mg via ORAL
  Filled 2015-04-20 (×2): qty 1

## 2015-04-20 MED ORDER — ONDANSETRON HCL 4 MG PO TABS: 4.0000 mg | ORAL_TABLET | Freq: Four times a day (QID) | ORAL | Status: DC | PRN

## 2015-04-20 MED ORDER — METHOCARBAMOL 500 MG PO TABS: 500.0000 mg | ORAL_TABLET | Freq: Four times a day (QID) | ORAL | Status: DC | PRN

## 2015-04-20 MED ORDER — PREGABALIN 75 MG PO CAPS
ORAL_CAPSULE | ORAL | Status: AC
  Administered 2015-04-20: 75 mg via ORAL
  Filled 2015-04-20: qty 1

## 2015-04-20 MED ORDER — MIDAZOLAM HCL 2 MG/2ML IJ SOLN
INTRAMUSCULAR | Status: DC | PRN
  Administered 2015-04-20: 1 mg via INTRAVENOUS
  Administered 2015-04-20: 2 mg via INTRAVENOUS

## 2015-04-20 MED ORDER — ONDANSETRON HCL 4 MG/2ML IJ SOLN: 4.0000 mg | Freq: Four times a day (QID) | INTRAMUSCULAR | Status: DC | PRN

## 2015-04-20 MED ORDER — FENTANYL CITRATE (PF) 100 MCG/2ML IJ SOLN
25.0000 ug | INTRAMUSCULAR | Status: AC | PRN
Start: 2015-04-20 — End: 2015-04-20
  Administered 2015-04-20 (×6): 25 ug via INTRAVENOUS

## 2015-04-20 MED ORDER — METHOCARBAMOL 1000 MG/10ML IJ SOLN: 500.0000 mg | Freq: Four times a day (QID) | INTRAVENOUS | Status: DC | PRN

## 2015-04-20 MED ORDER — NEOMYCIN-POLYMYXIN B GU 40-200000 IR SOLN
Status: AC
  Filled 2015-04-20: qty 4

## 2015-04-20 MED ORDER — BISACODYL 10 MG RE SUPP: 10.0000 mg | Freq: Every day | RECTAL | Status: DC | PRN

## 2015-04-20 SURGICAL SUPPLY — 43 items
BLADE SURG 15 STRL LF DISP TIS (BLADE) ×4 IMPLANT
BLADE SURG 15 STRL SS (BLADE) ×8
BLADE SURG SZ10 CARB STEEL (BLADE) ×6 IMPLANT
BNDG ESMARK 4X12 TAN STRL LF (GAUZE/BANDAGES/DRESSINGS) ×3 IMPLANT
BNDG ESMARK 6X12 TAN STRL LF (GAUZE/BANDAGES/DRESSINGS) ×3 IMPLANT
CANISTER SUCT 1200ML W/VALVE (MISCELLANEOUS) ×3 IMPLANT
CHLORAPREP W/TINT 26ML (MISCELLANEOUS) ×6 IMPLANT
DECANTER SPIKE VIAL GLASS SM (MISCELLANEOUS) ×3 IMPLANT
DRAPE C-ARM XRAY 36X54 (DRAPES) ×3 IMPLANT
DRAPE FLUOR MINI C-ARM 54X84 (DRAPES) ×3 IMPLANT
DRAPE INCISE IOBAN 66X45 STRL (DRAPES) ×3 IMPLANT
ELECT REM PT RETURN 9FT ADLT (ELECTROSURGICAL) ×3
ELECTRODE REM PT RTRN 9FT ADLT (ELECTROSURGICAL) ×1 IMPLANT
ETHIBOND 2 0 GREEN CT 2 30IN (SUTURE) ×3 IMPLANT
GAUZE PETRO XEROFOAM 1X8 (MISCELLANEOUS) ×3 IMPLANT
GAUZE SPONGE 4X4 12PLY STRL (GAUZE/BANDAGES/DRESSINGS) ×3 IMPLANT
GLOVE SURG ORTHO 8.0 STRL STRW (GLOVE) ×3 IMPLANT
GOWN STRL REUS W/ TWL XL LVL3 (GOWN DISPOSABLE) ×1 IMPLANT
GOWN STRL REUS W/TWL XL LVL3 (GOWN DISPOSABLE) ×2
HANDLE YANKAUER SUCT BULB TIP (MISCELLANEOUS) ×3 IMPLANT
IMMBOLIZER KNEE 19 BLUE UNIV (SOFTGOODS) ×3 IMPLANT
KIT RM TURNOVER STRD PROC AR (KITS) ×3 IMPLANT
NS IRRIG 500ML POUR BTL (IV SOLUTION) ×3 IMPLANT
PACK EXTREMITY ARMC (MISCELLANEOUS) ×3 IMPLANT
PADDING CAST 4IN STRL (MISCELLANEOUS) ×4
PADDING CAST BLEND 4X4 STRL (MISCELLANEOUS) ×2 IMPLANT
SOL PREP PVP 2OZ (MISCELLANEOUS) ×3
SOLUTION PREP PVP 2OZ (MISCELLANEOUS) ×1 IMPLANT
SPONGE LAP 18X18 5 PK (GAUZE/BANDAGES/DRESSINGS) ×3 IMPLANT
STAPLER SKIN PROX 35W (STAPLE) ×6 IMPLANT
STOCKINETTE BIAS CUT 4 980044 (GAUZE/BANDAGES/DRESSINGS) ×3 IMPLANT
STOCKINETTE BIAS CUT 6 980064 (GAUZE/BANDAGES/DRESSINGS) ×3 IMPLANT
STOCKINETTE STRL 6IN 960660 (GAUZE/BANDAGES/DRESSINGS) ×3 IMPLANT
SUT ETHILON 4-0 (SUTURE) ×2
SUT ETHILON 4-0 FS2 18XMFL BLK (SUTURE) ×1
SUT ORTHOCORD W/MULTIPK NDL (SUTURE) ×3 IMPLANT
SUT VIC AB 2-0 CT1 27 (SUTURE) ×2
SUT VIC AB 2-0 CT1 TAPERPNT 27 (SUTURE) ×1 IMPLANT
SUT VIC AB 3-0 SH 27 (SUTURE) ×2
SUT VIC AB 3-0 SH 27X BRD (SUTURE) ×1 IMPLANT
SUTURE ETHLN 4-0 FS2 18XMF BLK (SUTURE) ×1 IMPLANT
TUBING CONNECTING 10 (TUBING) ×2 IMPLANT
TUBING CONNECTING 10' (TUBING) ×1

## 2015-04-20 NOTE — H&P (Signed)
THE PATIENT WAS SEEN PRIOR TO SURGERY TODAY.  HISTORY, ALLERGIES, HOME MEDICATIONS AND OPERATIVE PROCEDURE WERE REVIEWED. RISKS AND BENEFITS OF SURGERY DISCUSSED WITH PATIENT AGAIN.  NO CHANGES FROM INITIAL HISTORY AND PHYSICAL NOTED.    

## 2015-04-20 NOTE — Anesthesia Preprocedure Evaluation (Signed)
Anesthesia Evaluation  Patient identified by MRN, date of birth, ID band Patient awake    Reviewed: Allergy & Precautions, H&P , NPO status , Patient's Chart, lab work & pertinent test results, reviewed documented beta blocker date and time   History of Anesthesia Complications Negative for: history of anesthetic complications  Airway Mallampati: III  TM Distance: >3 FB Neck ROM: full    Dental no notable dental hx. (+) Missing, Loose, Poor Dentition   Pulmonary shortness of breath and with exertion, asthma , neg sleep apnea, COPD,  COPD inhaler, neg recent URI, Current Smoker,    Pulmonary exam normal breath sounds clear to auscultation       Cardiovascular Exercise Tolerance: Good hypertension, (-) angina(-) CAD, (-) Past MI, (-) Cardiac Stents and (-) CABG Normal cardiovascular exam(-) dysrhythmias (-) Valvular Problems/Murmurs Rhythm:regular Rate:Normal     Neuro/Psych neg Seizures PSYCHIATRIC DISORDERS (Bipolar and schizophrenia) CVA (some right-sided facial droop), Residual Symptoms    GI/Hepatic Neg liver ROS, GERD  Medicated,  Endo/Other  negative endocrine ROS  Renal/GU negative Renal ROS  negative genitourinary   Musculoskeletal   Abdominal   Peds  Hematology negative hematology ROS (+)   Anesthesia Other Findings Past Medical History:   Schizophrenia (HCC)                                          Bipolar 1 disorder (HCC)                                     COPD (chronic obstructive pulmonary disease) (*              Hypertension                                                 High cholesterol                                             GERD (gastroesophageal reflux disease)                       Stroke (HCC)                                                 Reproductive/Obstetrics negative OB ROS                             Anesthesia Physical Anesthesia Plan  ASA:  III  Anesthesia Plan: General   Post-op Pain Management:    Induction:   Airway Management Planned:   Additional Equipment:   Intra-op Plan:   Post-operative Plan:   Informed Consent: I have reviewed the patients History and Physical, chart, labs and discussed the procedure including the risks, benefits and alternatives for the proposed anesthesia with the patient or authorized representative who has indicated his/her understanding and acceptance.   Dental Advisory  Given  Plan Discussed with: Anesthesiologist, CRNA and Surgeon  Anesthesia Plan Comments:         Anesthesia Quick Evaluation

## 2015-04-20 NOTE — Anesthesia Procedure Notes (Signed)
Procedure Name: LMA Insertion Date/Time: 04/20/2015 1:20 PM Performed by: Omer JackWEATHERLY,  Pre-anesthesia Checklist: Patient identified, Patient being monitored, Timeout performed, Emergency Drugs available and Suction available Patient Re-evaluated:Patient Re-evaluated prior to inductionOxygen Delivery Method: Circle system utilized Preoxygenation: Pre-oxygenation with 100% oxygen Intubation Type: IV induction Ventilation: Mask ventilation without difficulty LMA: LMA inserted LMA Size: 4.5 Tube type: Oral Number of attempts: 1 Placement Confirmation: positive ETCO2 and breath sounds checked- equal and bilateral Tube secured with: Tape Dental Injury: Teeth and Oropharynx as per pre-operative assessment

## 2015-04-20 NOTE — Progress Notes (Addendum)
Spoke with Dr. Hyacinth MeekerMiller about pt taking home med's. Pt takes 50 mg of Benadryl at nighttime and also takes 5mg  of Valium. Dr. Hyacinth MeekerMiller stated may be ordered.

## 2015-04-20 NOTE — Transfer of Care (Signed)
Immediate Anesthesia Transfer of Care Note  Patient: Scott LoryRobert E Liberman  Procedure(s) Performed: Procedure(s): HARDWARE REMOVAL left leg (Left)  Patient Location: PACU  Anesthesia Type:General  Level of Consciousness: sedated and patient cooperative  Airway & Oxygen Therapy: Patient Spontanous Breathing and Patient connected to face mask oxygen  Post-op Assessment: Report given to RN and Post -op Vital signs reviewed and stable  Post vital signs: Reviewed and stable  Last Vitals:  Filed Vitals:   04/20/15 1119 04/20/15 1510  BP: 118/87 100/54  Pulse: 69 80  Temp: 37.1 C 37.1 C  Resp: 16 20    Complications: No apparent anesthesia complications

## 2015-04-20 NOTE — Op Note (Signed)
04/20/2015  3:09 PM  PATIENT:  Scott Howell    PRE-OPERATIVE DIAGNOSIS:  T85.698D Mech compl of internal prosth dev/grft, subs.  Painful hardware, left lateral knee  POST-OPERATIVE DIAGNOSIS:  Same  PROCEDURE:  HARDWARE REMOVAL left leg  SURGEON:  Valinda HoarMILLER, E, MD   ANESTHESIA:   General  PREOPERATIVE INDICATIONS:  Scott Howell is a  45 y.o. male with a diagnosis of T85.698D Mech compl of internal prosth dev/grft, subs who failed conservative measures and elected for surgical management.    The risks benefits and alternatives were discussed with the patient preoperatively including but not limited to the risks of infection, bleeding, nerve injury, cardiopulmonary complications, the need for revision surgery, among others, and the patient was willing to proceed.  OPERATIVE IMPLANTS: None implanted.  8 screws and one K wire removed.  OPERATIVE FINDINGS: Healed left lateral tibial plateau fracture with persistent depression.  EBL: Minimal  TOURNIQUET TIME: 69 minutes  COMPLICATIONS:   None  OPERATIVE PROCEDURE: The patient underwent general LMA anesthesia in the supine position on the operating room table.  The left leg was prepped and draped in sterile fashion.  Esmarch was applied and tourniquet inflated to 300 mmHg.  The upper portion of his previous incision laterally was reopened.  Dissection was was carried out sharply through scar tissue down to fascia.  The posterior superior screw, which had started to back out was identified and was removed.  The plate was then exposed and the upper screws carefully backed up and then removed.  Fluoroscopy identified the site of the distal 2 screws and a second incision was made distally.  Sharp dissection was carried out down to the plate which was freed of soft tissue and those 2 screws exposed.  These were also removed without difficulty.  The plate was then freed up from bone using a periosteal elevator and was removed.  A single K  wire was left proximally and this was removed without difficulty.  Synovial fluid leaked from the joint during the procedure and a small hole in the capsule was identified.  The joint was thoroughly irrigated.  The capsule was repaired with #2 Tycron suture and 2-0 Ethibond.  After further irrigation, the fascia was closed with 2-0 Vicryl.  Subcutaneous tissue was closed with 2-0 Vicryl and the skin was closed with staples.  Percent Marcaine was placed in the wounds.  Dry sterile dressing was applied.  Tourniquet was deflated with good return of blood flow to the foot.  Knee immobilizer was applied and the patient was awakened and taken recovery in good condition.  Sponge and needle counts were correct.   Deeann SaintHoward , MD

## 2015-04-21 ENCOUNTER — Encounter: Payer: Self-pay | Admitting: Specialist

## 2015-04-21 DIAGNOSIS — T85698A Other mechanical complication of other specified internal prosthetic devices, implants and grafts, initial encounter: Secondary | ICD-10-CM | POA: Diagnosis not present

## 2015-04-21 MED ORDER — HYDROCODONE-ACETAMINOPHEN 7.5-325 MG PO TABS: 1.0000 | ORAL_TABLET | Freq: Four times a day (QID) | ORAL | Status: DC | PRN

## 2015-04-21 NOTE — Progress Notes (Signed)
Discharge instructions/prescriptions given; acknowledged understanding. 

## 2015-04-21 NOTE — Care Management Note (Addendum)
Case Management Note  Patient Details  Name: Scott LoryRobert E Howell MRN: [id number] Date of Birth: [birth year]  Subjective/Objective:      Scott Howell does not have a qualifying diagnosis for home health PT and he reports that he does not have transportation to go to [facility]. [facility] delivered a rolling  walker to Scott Howell in his hospital room. Scott Howell was provided with a taxi voucher for transportation home.             Action/Plan:   Expected Discharge Date:  04/21/15               Expected Discharge Plan:     In-House Referral:     Discharge planning Services     Post Acute Care Choice:    Choice offered to:     DME Arranged:    DME Agency:     HH Arranged:    HH Agency:     Status of Service:     Medicare Important Message Given:    Date Medicare IM Given:    Medicare IM give by:    Date Additional Medicare IM Given:    Additional Medicare Important Message give by:     If discussed at Long Length of Stay Meetings, dates discussed:    Additional Comments:  , A, RN 04/21/2015, 1:28 PM

## 2015-04-21 NOTE — Clinical Social Work Note (Signed)
Clinical Social Work Assessment  Patient Details  Name: Scott Howell MRN: [id number] Date of Birth: 03-05-1970  Date of referral:  04/21/15               Reason for consult:  Mental Health Concerns, Facility Placement                Permission sought to share information with:  Facility Art therapist granted to share information::  No  Name::        Agency::     Relationship::     Contact Information:     Housing/Transportation Living arrangements for the past 2 months:  Apartment Source of Information:  Patient Patient Interpreter Needed:  None Criminal Activity/Legal Involvement Pertinent to Current Situation/Hospitalization:  No - Comment as needed Significant Relationships:  Parents, Mental Health Provider Lives with:  Self Do you feel safe going back to the place where you live?  Yes Need for family participation in patient care:  Yes (Comment)  Care giving concerns:  Patient lives alone in [place].    Social Worker assessment / plan:  Holiday representative (CSW) received verbal consult from PT that patient will need SNF. PT changed recommendation to home health this afternoon. CSW met with patient to discuss D/C plan. Patient was sitting up in the bed and was alert and oriented. Patient reported that he lives alone in [place]. Per patient his mother can check on him and go to the grocery store for him. Patient reported that he goes to [employer] and has a Education officer, museum. Patient also reported that he has 2 friends that live across the hall from him that will also provide assistance. Patient reported that the last time he had knee surgery he went to rehab and left after 3 days. Patient adamantly refused SNF and reported that he is going home today. Patient reported that he has Medicaid transportation. RN Case Manager is aware of above. Please reconsult if future social work needs arise. CSW signing off.   Employment status:  Disabled (Comment on whether or  not currently receiving Disability) Insurance information:  Medicaid [place] PT Recommendations:  [place], Home with [place] / Referral to community resources:     Patient/Family's Response to care:  Patient refused SNF and is agreeable to discharging home today.   Patient/Family's Understanding of and Emotional Response to Diagnosis, Current Treatment, and Prognosis:  Patient was pleasant and thanked CSW for visit.   Emotional Assessment Appearance:  Appears stated age Attitude/Demeanor/Rapport:    Affect (typically observed):  Accepting, Adaptable, Pleasant Orientation:  Oriented to Self, Oriented to Place, Oriented to  Time, Oriented to Situation Alcohol / Substance use:  Not Applicable Psych involvement (Current and /or in the community):  No (Comment)  Discharge Needs  Concerns to be addressed:  Discharge Planning Concerns, No discharge needs identified Readmission within the last 30 days:  No Current discharge risk:  None Barriers to Discharge:  No Barriers Identified   Loralyn Freshwater, LCSW 04/21/2015, 2:07 PM

## 2015-04-21 NOTE — Discharge Summary (Signed)
Physician Discharge Summary  Patient ID: Scott LoryRobert E Lindblad MRN: [id number] DOB/AGE: [birth year] 44 y.o.  Admit date: 04/20/2015 Discharge date: 04/21/2015  Admission Diagnoses:  Discharge Diagnoses:  Active Problems:   Status post hardware removal   Discharged Condition: good  Hospital Course: Underwent hardware removal left leg 04/20/15 and did well overnight.  Able to ambulate today and ready to go home.  RTC 2 days.  PWB left leg  Consults: None  Significant Diagnostic Studies:    Treatments: therapies: PT  Discharge Exam: Blood pressure 121/75, pulse 87, temperature 98 F (36.7 C), temperature source Oral, resp. rate 15, height 6\' 1"  (1.854 m), weight 98.884 kg (218 lb), SpO2 97 %. Extremities: Homans sign is negative, no sign of DVT, no edema, redness or tenderness in the calves or thighs and Dressing dry. CSM good.    Disposition: 01-Home or Self Care  Discharge Instructions    Call MD for:  persistant nausea and vomiting    Complete by:  As directed      Call MD for:  redness, tenderness, or signs of infection (pain, swelling, redness, odor or green/yellow discharge around incision site)    Complete by:  As directed      Call MD for:  severe uncontrolled pain    Complete by:  As directed      Call MD for:  temperature >100.4    Complete by:  As directed      Diet - low sodium heart healthy    Complete by:  As directed      Discharge instructions    Complete by:  As directed   Minimal weight left leg Ice left knee Do range of motion exercises every 4-5 hours     Increase activity slowly    Complete by:  As directed      Leave dressing on - Keep it clean, dry, and intact until clinic visit    Complete by:  As directed             Medication List    TAKE these medications        albuterol 108 (90 Base) MCG/ACT inhaler  Commonly known as:  PROVENTIL HFA;VENTOLIN HFA  Inhale 1-2 puffs into the lungs every 6 (six) hours as needed for wheezing or shortness of  breath.     aspirin 81 MG tablet  Take 81 mg by mouth.     beclomethasone 40 MCG/ACT inhaler  Commonly known as:  QVAR  Inhale 2 puffs into the lungs 2 (two) times daily.     diazepam 10 MG tablet  Commonly known as:  VALIUM  Take 5 mg by mouth 3 (three) times daily as needed.     diphenhydrAMINE 50 MG capsule  Commonly known as:  BENADRYL  Take 50 mg by mouth at bedtime as needed.     DULoxetine 60 MG capsule  Commonly known as:  CYMBALTA  Take 120 mg by mouth daily.     hydrochlorothiazide 25 MG tablet  Commonly known as:  HYDRODIURIL  Take 1 tablet (25 mg total) by mouth daily.     HYDROcodone-acetaminophen 7.5-325 MG tablet  Commonly known as:  NORCO  Take 1 tablet by mouth every 6 (six) hours as needed for moderate pain.     meloxicam 15 MG tablet  Commonly known as:  MOBIC  Take 1 tablet (15 mg total) by mouth daily.     omeprazole 20 MG capsule  Commonly known as:  PRILOSEC  Take  1 capsule (20 mg total) by mouth daily.     oxyCODONE 5 MG immediate release tablet  Commonly known as:  ROXICODONE  Take 1 tablet (5 mg total) by mouth every 6 (six) hours as needed for severe pain.     QUEtiapine 50 MG tablet  Commonly known as:  SEROQUEL  Take 100-200 mg by mouth 4 (four) times daily. Pt takes one tablet three times a day and two tablets at bedtime.         Signed: Valinda Hoar 04/21/2015, 1:04 PM

## 2015-04-21 NOTE — Progress Notes (Signed)
Subjective: 1 Day Post-Op Procedure(s) (LRB): HARDWARE REMOVAL left leg (Left)    Patient reports pain as mild.  Objective:   VITALS:   Filed Vitals:   04/21/15 0747 04/21/15 0939  BP: 121/75   Pulse: 88 87  Temp: 98 F (36.7 C)   Resp: 15     Neurologically intact Neurovascular intact Sensation intact distally Intact pulses distally Dorsiflexion/Plantar flexion intact Incision: no drainage  LABS  Recent Labs  04/20/15 1700  HGB 12.9*  HCT 37.7*  WBC 13.2*  PLT 229     Recent Labs  04/20/15 1700  CREATININE 0.91    No results for input(s): LABPT, INR in the last 72 hours.   Assessment/Plan: 1 Day Post-Op Procedure(s) (LRB): HARDWARE REMOVAL left leg (Left)    D/C home today

## 2015-04-21 NOTE — Anesthesia Postprocedure Evaluation (Signed)
Anesthesia Post Note  Patient: Scott LoryRobert E Nutter  Procedure(s) Performed: Procedure(s) (LRB): HARDWARE REMOVAL left leg (Left)  Patient location during evaluation: PACU Anesthesia Type: General Level of consciousness: awake and alert Pain management: pain level controlled Vital Signs Assessment: post-procedure vital signs reviewed and stable Respiratory status: spontaneous breathing, nonlabored ventilation, respiratory function stable and patient connected to nasal cannula oxygen Cardiovascular status: blood pressure returned to baseline and stable Postop Assessment: no signs of nausea or vomiting Anesthetic complications: no    Last Vitals:  Filed Vitals:   04/21/15 0409 04/21/15 0747  BP: 121/71 121/75  Pulse: 85 88  Temp: 36.4 C 36.7 C  Resp: 16 15    Last Pain:  Filed Vitals:   04/21/15 0746  PainSc: 4                  Lenard SimmerAndrew 

## 2015-04-21 NOTE — Evaluation (Signed)
Physical Therapy Evaluation Patient Details Name: Scott Howell MRN: [id number] DOB: [birth year] Today's Date: 04/21/2015   History of Present Illness  45 yo M with L knee hardware removal , NWB L LE. PMH includes Bipolar disorder, schizophrenia, HTN.  Clinical Impression  Pt presents s/p L knee hardware removal with difficulty walking. His overall strength is 5/5, except L knee NT due to immobilization. He is independent with bed mobility and requires min A for transfers and ambulation of about 75 ft with FWW. Pt is impulsive and demonstrates impulsivity, requiring cues for safety awareness and maintaining NWB L LE. Pt has a KI for L knee. PT left message for Dr. Hyacinth Meeker for clarification on when/when not to wear KI and if any ROM for L knee is to be performed. Due to pt's living situation and decreased caregiver support, he will benefit from STR to increase mobility and safety before returning home alone. Pt will benefit from skilled PT services to increase functional I and mobility for safe discharge.     Follow Up Recommendations SNF;Supervision for mobility/OOB    Equipment Recommendations  Rolling walker with 5" wheels    Recommendations for Other Services       Precautions / Restrictions Precautions Precautions: Fall Required Braces or Orthoses: Knee Immobilizer - Left Knee Immobilizer - Left: Other (comment) (PT contacted MD to clarify on KI and ROM.) Restrictions Weight Bearing Restrictions: Yes LLE Weight Bearing: Non weight bearing      Mobility  Bed Mobility Overal bed mobility: Independent                Transfers Overall transfer level: Needs assistance Equipment used: Rolling walker (2 wheeled) Transfers: Sit to/from [employer] Sit to Stand: Min assist Stand pivot transfers: Min assist       General transfer comment: pt impulsive and has decreased safety awareness  Ambulation/Gait Ambulation/Gait assistance: Min assist Ambulation  Distance (Feet): 75 Feet Assistive device: Rolling walker (2 wheeled) Gait Pattern/deviations:  (hop-to gait pattern; NWB L LE)     General Gait Details: Requires occassional cues for maintaining NWB L LE and for safety awareness. Pt impulsive during turns and obstacle navigation. Pt attempted ambulation with crutches and was moderately unsteady.  Stairs Stairs: Yes Stairs assistance: Min assist Stair Management: Seated/boosting Number of Stairs: 4 General stair comments: Pt is unable to safely navigate steps with crutches or crutch and rail as he has difficulty maintaining WBS. He would be unable to get FWW up/down the stairs alone in order to scoot up/down in seated position. Impulsive and requires cues for safety.  Wheelchair Mobility    Modified Rankin (Stroke Patients Only)       Balance Overall balance assessment: Needs assistance;History of Falls Sitting-balance support: Feet unsupported;No upper extremity supported Sitting balance-Leahy Scale: Normal     Standing balance support: Single extremity supported Standing balance-Leahy Scale: Fair Standing balance comment: decreased due to NWB L LE                             Pertinent Vitals/Pain Pain Assessment: 0-10 Pain Score: 9  Pain Location: L knee Pain Descriptors / Indicators: Aching Pain Intervention(s): Limited activity within patient's tolerance;Monitored during session;Premedicated before session    Home Living Family/patient expects to be discharged to:: Private residence Living Arrangements: Alone Available Help at Discharge:  (none) Type of Home: Apartment Home Access: Stairs to enter Entrance Stairs-Rails: Right;Left Entrance Stairs-Number of Steps: 3  Home Equipment: Gilmer MorCane - single point;Walker - 4 wheels      Prior Function Level of Independence: Independent         Comments: Pt I with ADLs and ambulation. Ambulation has been limited by knee pain.  [facility], he doesn't have anyone to help him and is totally on his own.     Hand Dominance        Extremity/Trunk Assessment   Upper Extremity Assessment: Overall WFL for tasks assessed           Lower Extremity Assessment: Overall WFL for tasks assessed;LLE deficits/detail   LLE Deficits / Details: formal MMT/ROM not performed due to KI     Communication   Communication: No difficulties  Cognition Arousal/Alertness: Awake/alert Behavior During Therapy: WFL for tasks assessed/performed Overall Cognitive Status: Difficult to assess                      General Comments      Exercises        Assessment/Plan    PT Assessment Patient needs continued PT services  PT Diagnosis Difficulty walking   PT Problem List Decreased strength;Decreased range of motion;Decreased activity tolerance;Decreased balance;Decreased mobility;Decreased safety awareness;Decreased knowledge of use of DME;Decreased knowledge of precautions  PT Treatment Interventions DME instruction;Gait training;Stair training;Therapeutic activities;Therapeutic exercise;Balance training;Patient/family education   PT Goals (Current goals can be found in the Care Plan section) Acute Rehab PT Goals Patient Stated Goal: To go home PT Goal Formulation: With patient Time For Goal Achievement: 05/05/15 Potential to Achieve Goals: Good    Frequency BID   Barriers to discharge Inaccessible home environment;Decreased caregiver support Pt has nobody to assist him after discharge and has steps to enter home.    Co-evaluation               End of Session Equipment Utilized During Treatment: Gait belt;Left knee immobilizer Activity Tolerance: Patient tolerated treatment well Patient left: in bed;with call bell/phone within reach;with bed alarm set;with SCD's reapplied Nurse Communication: Mobility status    Functional Assessment Tool Used: Clinical judgement; SLS Functional Limitation:  Mobility: Walking and moving around Mobility: Walking and Moving Around Current Status ([id number](G8978): At least 20 percent but less than 40 percent impaired, limited or restricted Mobility: Walking and Moving Around Goal Status [phone](G8979): 0 percent impaired, limited or restricted    Time: [id number] PT Time Calculation (min) (ACUTE ONLY): 30 min   Charges:   PT Evaluation $PT Eval Moderate Complexity: 1 Procedure PT Treatments $Gait Training: 8-22 mins   PT G Codes:   PT G-Codes **NOT FOR INPATIENT CLASS** Functional Assessment Tool Used: Clinical judgement; SLS Functional Limitation: Mobility: Walking and moving around Mobility: Walking and Moving Around Current Status ([id number](G8978): At least 20 percent but less than 40 percent impaired, limited or restricted Mobility: Walking and Moving Around Goal Status [phone](G8979): 0 percent impaired, limited or restricted    Adelene IdlerMindy Jo , PT, DPT  04/21/2015, 10:03 AM [phone]

## 2015-04-21 NOTE — Progress Notes (Signed)
Pt refusing to wear ice on operative knee.

## 2015-04-21 NOTE — Progress Notes (Signed)
Physical Therapy Treatment Patient Details Name: Scott Howell MRN: [id number] DOB: 03-Jun-1970 Today's Date: 04/21/2015    History of Present Illness 45 yo M with L knee hardware removal , now PWB (minimal) L LE. PMH includes Bipolar disorder, schizophrenia, HTN.    PT Comments    Pt has been upgraded to PWB (minimal) for L LE per discharge instructions and documentation by Dr. Hyacinth Meeker. This has allowed pt to significantly improve in functional mobility and safety. PT consulted with Dr. Rondel Baton staff Mardella Layman) and pt may remove KI for ROM exercises as tolerated, but must wear KI for all mobility. Read back and verified. Pt ambulated 125 ft x2 with FWW and supervision. Ascended/ Descended 4 steps with rail and FWW with supervision and cues for safe technique. With upgrade in WBS and progression towards goals, pt is appropriate for home with HHPT.  Follow Up Recommendations  Home health PT     Equipment Recommendations  Rolling walker with 5" wheels    Recommendations for Other Services       Precautions / Restrictions Precautions Required Braces or Orthoses: Knee Immobilizer - Left Knee Immobilizer - Left: On when out of bed or walking;Other (comment) (Pt can remove for ROM exercises as tolerated.) Restrictions Weight Bearing Restrictions: Yes LLE Weight Bearing: Partial weight bearing LLE Partial Weight Bearing Percentage or Pounds: minimal per Dr. Bing Neighbors    Mobility  Bed Mobility Overal bed mobility: Independent                Transfers Overall transfer level: Modified independent Equipment used: Rolling walker (2 wheeled) Transfers: Sit to/from [employer] Sit to Stand: Supervision Stand pivot transfers: Supervision       General transfer comment: pt impulsive and has decreased safety awareness  Ambulation/Gait Ambulation/Gait assistance: Supervision Ambulation Distance (Feet): 125 Feet Assistive device: Rolling walker (2  wheeled) Gait Pattern/deviations: Step-to pattern (PWB L LE)     General Gait Details: Requires occassional cues for maintaining PWB L LE and for safety awareness. Pt impulsive during turns and obstacle navigation.   Stairs Stairs: Yes Stairs assistance: Supervision Stair Management: One rail Right;With walker (Pt able to step up/down with PWB L LE) Number of Stairs: 4 General stair comments: Pt able to step up/down now that WBS upgrades to PWB/minimal using rail in one hand and FWW in other. Cues for safety and technique.  Wheelchair Mobility    Modified Rankin (Stroke Patients Only)       Balance Overall balance assessment: Modified Independent Sitting-balance support: No upper extremity supported;Feet unsupported Sitting balance-Leahy Scale: Normal     Standing balance support: Single extremity supported Standing balance-Leahy Scale: Good Standing balance comment: no LOB; PWB L LE                    Cognition Arousal/Alertness: Awake/alert Behavior During Therapy: WFL for tasks assessed/performed Overall Cognitive Status: Within Functional Limits for tasks assessed                      Exercises Other Exercises Other Exercises: Pt education on proper donning/doffing of KI for L LE. Instructed pt to perform ROM as tolerated for L knee per Dr. Hyacinth Meeker. Pt demonstrated and verbalized understanding.    General Comments        Pertinent Vitals/Pain Pain Assessment: 0-10 Pain Score: 6  Pain Location: L knee Pain Descriptors / Indicators: Aching Pain Intervention(s): Premedicated before session    Home Living  Prior Function            PT Goals (current goals can now be found in the care plan section) Acute Rehab PT Goals Patient Stated Goal: To go home PT Goal Formulation: With patient Time For Goal Achievement: 05/05/15 Potential to Achieve Goals: Good Progress towards PT goals: Progressing toward goals     Frequency  BID    PT Plan Discharge plan needs to be updated    Co-evaluation             End of Session Equipment Utilized During Treatment: Gait belt;Left knee immobilizer Activity Tolerance: Patient tolerated treatment well Patient left: in bed;with call bell/phone within reach;with bed alarm set;with SCD's reapplied     Time: [id number] PT Time Calculation (min) (ACUTE ONLY): 24 min  Charges:  $Gait Training: 8-22 mins $Therapeutic Activity: 8-22 mins                    G Codes:      Adelene IdlerMindy Jo , PT, DPT  04/21/2015, 2:33 PM [phone]

## 2015-04-22 ENCOUNTER — Encounter: Payer: Self-pay | Admitting: Family Medicine

## 2015-04-22 ENCOUNTER — Ambulatory Visit (INDEPENDENT_AMBULATORY_CARE_PROVIDER_SITE_OTHER): Payer: Medicaid Other | Admitting: Family Medicine

## 2015-04-22 VITALS — BP 122/82 | HR 85 | Temp 98.4°F | Resp 14 | Ht 73.0 in | Wt 226.0 lb

## 2015-04-22 DIAGNOSIS — N529 Male erectile dysfunction, unspecified: Secondary | ICD-10-CM | POA: Insufficient documentation

## 2015-04-22 MED ORDER — SILDENAFIL CITRATE 100 MG PO TABS: 100.0000 mg | ORAL_TABLET | Freq: Every day | ORAL | Status: DC | PRN

## 2015-04-22 NOTE — Progress Notes (Signed)
Name: Scott Howell   MRN: [id number]    DOB: 02-18-1970   Date:04/22/2015       Progress Note  Subjective  Chief Complaint  Chief Complaint  Patient presents with  . Erectile Dysfunction    wants to see about getting rx for something    HPI  Scott Howell is a 45 year old male here today to report difficulty maintaining satisfying erections to complete intercourse. Still has good sexual desire. Has re connected with a new male partner from his past. Used a friend's Viagra 50 mg, modest benefits, then he tried 100 mg which worked best. Denies associated dizziness or palpitations.  Otherwise had recent left knee surgery and is working with Orthopedist regarding pain control.   Past Medical History  Diagnosis Date  . Schizophrenia ([place])   . Bipolar 1 disorder ([place])   . COPD (chronic obstructive pulmonary disease) ([place])   . Hypertension   . High cholesterol   . GERD (gastroesophageal reflux disease)   . Stroke [facility])     Patient Active Problem List   Diagnosis Date Noted  . Status post hardware removal 04/20/2015  . Homicidal ideation 04/06/2015  . Alcohol abuse 04/06/2015  . Bipolar 2 disorder ([place]) 07/29/2014  . Marijuana abuse 07/29/2014  . Chronic knee pain 07/29/2014  . Bipolar 1 disorder, mixed ([place])   . HLD (hyperlipidemia) 10/11/2013  . Drug abuse, opioid type 06/28/2013  . 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 GERD without esophagitis 10/03/2012  . Hypertension goal BP (blood pressure) < 140/90 10/03/2012  . Injury of leg 10/03/2012  . Chronic pain associated with significant psychosocial dysfunction 07/17/2012  . Other long term (current) drug therapy 07/17/2012  . Lumbar canal stenosis 04/09/2012  . Hypercholesterolemia 01/06/2012  . CN (constipation) 11/11/2011  . 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 Decreased motor strength 09/30/2010  . Current tobacco use 09/30/2010  . H/O transient cerebral ischemia 09/09/2010  . LBP (low back pain)  08/30/2010  . Cervical spinal cord compression ([place]) 07/22/2010  . Cervical post-laminectomy syndrome 07/22/2010  . Chronic obstructive pulmonary disease ([place]) 02/23/2010    Social History  Substance Use Topics  . Smoking status: Current Every Day Smoker    Types: Cigarettes  . Smokeless tobacco: Never Used  . Alcohol Use: No     Comment: Sober for 12 yrs.     Current outpatient prescriptions:  .  albuterol (PROVENTIL HFA;VENTOLIN HFA) 108 (90 Base) MCG/ACT inhaler, Inhale 1-2 puffs into the lungs every 6 (six) hours as needed for wheezing or shortness of breath., Disp: 18 g, Rfl: 5 .  aspirin 81 MG tablet, Take 81 mg by mouth., Disp: , Rfl:  .  beclomethasone (QVAR) 40 MCG/ACT inhaler, Inhale 2 puffs into the lungs 2 (two) times daily., Disp: 1 Inhaler, Rfl: 5 .  diazepam (VALIUM) 10 MG tablet, Take 5 mg by mouth 3 (three) times daily as needed. , Disp: , Rfl:  .  diphenhydrAMINE (BENADRYL) 50 MG capsule, Take 50 mg by mouth at bedtime as needed., Disp: , Rfl:  .  DULoxetine (CYMBALTA) 60 MG capsule, Take 120 mg by mouth daily. , Disp: , Rfl:  .  hydrochlorothiazide (HYDRODIURIL) 25 MG tablet, Take 1 tablet (25 mg total) by mouth daily., Disp: 30 tablet, Rfl: 5 .  meloxicam (MOBIC) 15 MG tablet, Take 1 tablet (15 mg total) by mouth daily., Disp: 30 tablet, Rfl: 5 .  omeprazole (PRILOSEC) 20 MG capsule, Take 1 capsule (20 mg total) by  mouth daily., Disp: 30 capsule, Rfl: 5 .  oxyCODONE (ROXICODONE) 5 MG immediate release tablet, Take 1 tablet (5 mg total) by mouth every 6 (six) hours as needed for severe pain., Disp: 40 tablet, Rfl: 0 .  QUEtiapine (SEROQUEL) 50 MG tablet, Take 100-200 mg by mouth 4 (four) times daily. Pt takes one tablet three times a day and two tablets at bedtime., Disp: , Rfl:   Past Surgical History  Procedure Laterality Date  . Cervical spine surgery      4  . Shoulder surgery Left   . Knee surgery Left   . Hardware removal Left 04/20/2015    Procedure:  HARDWARE REMOVAL left leg;  Surgeon: Earnestine Leys, MD;  Location: [facility] ORS;  Service: Orthopedics;  Laterality: Left;    Family History  Problem Relation Age of Onset  . Diabetes Mother   . Hyperlipidemia Mother   . Hypertension Mother   . Diabetes Maternal Aunt   . Cancer Maternal Aunt   . Hyperlipidemia Maternal Aunt   . Hypertension Maternal Aunt   . Diabetes Maternal Uncle   . Cancer Maternal Uncle   . Hyperlipidemia Maternal Uncle   . Hypertension Maternal Uncle     Allergies  Allergen Reactions  . Acetaminophen Other (See Comments)    Pt states that it makes him hyper.      Review of Systems  CONSTITUTIONAL: No significant weight changes, fever, chills, weakness or fatigue.  CARDIOVASCULAR: No chest pain, chest pressure or chest discomfort. No palpitations or edema.  RESPIRATORY: No shortness of breath, cough or sputum.  GASTROINTESTINAL: No anorexia, nausea, vomiting. No changes in bowel habits. No abdominal pain or blood.  GENITOURINARY: No dysuria. No frequency. No discharge.  NEUROLOGICAL: No headache, dizziness, syncope, paralysis, ataxia, numbness or tingling in the extremities. No memory changes. No change in bowel or bladder control.  MUSCULOSKELETAL: Yes joint pain. No muscle pain. HEMATOLOGIC: No anemia, bleeding or bruising.  LYMPHATICS: No enlarged lymph nodes.  PSYCHIATRIC: No change in mood. No change in sleep pattern.  ENDOCRINOLOGIC: No reports of sweating, cold or heat intolerance. No polyuria or polydipsia.     Objective  BP 122/82 mmHg  Pulse 85  Temp(Src) 98.4 F (36.9 C) (Oral)  Resp 14  Ht 6' 1"  (1.854 m)  Wt 226 lb (102.513 kg)  BMI 29.82 kg/m2  SpO2 96% Body mass index is 29.82 kg/(m^2).  Physical Exam  Constitutional: Patient appears well-developed and well-nourished. In no distress.  Cardiovascular: Normal rate, regular rhythm and normal heart sounds.  No murmur heard.  Pulmonary/Chest: Effort normal and breath sounds normal.  No respiratory distress. Musculoskeletal: Normal range of motion bilateral UE. Left LE in cast, using seated walker to ambulate. Peripheral vascular: Right LE no edema. Psychiatric: Patient has a stable happy mood and affect. Behavior is normal in office today. Judgment and thought content normal in office today.   Recent Results (from the past 2160 hour(s))  Comprehensive metabolic panel     Status: Abnormal   Collection Time: 04/06/15 12:42 PM  Result Value Ref Range   Sodium 137 135 - 145 mmol/L   Potassium 3.0 (L) 3.5 - 5.1 mmol/L   Chloride 102 101 - 111 mmol/L   CO2 25 22 - 32 mmol/L   Glucose, Bld 128 (H) 65 - 99 mg/dL   BUN <5 (L) 6 - 20 mg/dL   Creatinine, Ser 0.68 0.61 - 1.24 mg/dL   Calcium 8.7 (L) 8.9 - 10.3 mg/dL   Total Protein  7.9 6.5 - 8.1 g/dL   Albumin 4.5 3.5 - 5.0 g/dL   AST 47 (H) 15 - 41 U/L   ALT 59 17 - 63 U/L   Alkaline Phosphatase 60 38 - 126 U/L   Total Bilirubin 0.4 0.3 - 1.2 mg/dL   GFR calc non Af Amer >60 >60 mL/min   GFR calc Af Amer >60 >60 mL/min    Comment: (NOTE) The eGFR has been calculated using the CKD EPI equation. This calculation has not been validated in all clinical situations. eGFR's persistently <60 mL/min signify possible Chronic Kidney Disease.    Anion gap 10 5 - 15  Ethanol (ETOH)     Status: Abnormal   Collection Time: 04/06/15 12:42 PM  Result Value Ref Range   Alcohol, Ethyl (B) 277 (H) <5 mg/dL    Comment:        LOWEST DETECTABLE LIMIT FOR SERUM ALCOHOL IS 5 mg/dL FOR MEDICAL PURPOSES ONLY   Salicylate level     Status: None   Collection Time: 04/06/15 12:42 PM  Result Value Ref Range   Salicylate Lvl <4.4 2.8 - 30.0 mg/dL  Acetaminophen level     Status: Abnormal   Collection Time: 04/06/15 12:42 PM  Result Value Ref Range   Acetaminophen (Tylenol), Serum <10 (L) 10 - 30 ug/mL    Comment:        THERAPEUTIC CONCENTRATIONS VARY SIGNIFICANTLY. A RANGE OF 10-30 ug/mL MAY BE AN EFFECTIVE CONCENTRATION FOR MANY  PATIENTS. HOWEVER, SOME ARE BEST TREATED AT CONCENTRATIONS OUTSIDE THIS RANGE. ACETAMINOPHEN CONCENTRATIONS >150 ug/mL AT 4 HOURS AFTER INGESTION AND >50 ug/mL AT 12 HOURS AFTER INGESTION ARE OFTEN ASSOCIATED WITH TOXIC REACTIONS.   CBC     Status: Abnormal   Collection Time: 04/06/15 12:42 PM  Result Value Ref Range   WBC 11.2 (H) 3.8 - 10.6 K/uL   RBC 5.22 4.40 - 5.90 MIL/uL   Hemoglobin 15.6 13.0 - 18.0 g/dL   HCT 45.5 40.0 - 52.0 %   MCV 87.1 80.0 - 100.0 fL   MCH 30.0 26.0 - 34.0 pg   MCHC 34.4 32.0 - 36.0 g/dL   RDW 13.6 11.5 - 14.5 %   Platelets 315 150 - 440 K/uL  CBC     Status: Abnormal   Collection Time: 04/20/15  5:00 PM  Result Value Ref Range   WBC 13.2 (H) 3.8 - 10.6 K/uL   RBC 4.21 (L) 4.40 - 5.90 MIL/uL   Hemoglobin 12.9 (L) 13.0 - 18.0 g/dL   HCT 37.7 (L) 40.0 - 52.0 %   MCV 89.6 80.0 - 100.0 fL   MCH 30.6 26.0 - 34.0 pg   MCHC 34.2 32.0 - 36.0 g/dL   RDW 14.0 11.5 - 14.5 %   Platelets 229 150 - 440 K/uL  Creatinine, serum     Status: None   Collection Time: 04/20/15  5:00 PM  Result Value Ref Range   Creatinine, Ser 0.91 0.61 - 1.24 mg/dL   GFR calc non Af Amer >60 >60 mL/min   GFR calc Af Amer >60 >60 mL/min    Comment: (NOTE) The eGFR has been calculated using the CKD EPI equation. This calculation has not been validated in all clinical situations. eGFR's persistently <60 mL/min signify possible Chronic Kidney Disease.      Assessment & Plan   1. Erectile dysfunction, unspecified erectile dysfunction type I do not endorse trying other people's medications. The patient has been counseled on the proper use, side effects  and potential interactions of the new medication. Patient encouraged to review the side effects and safety profile pamphlet provided with the prescription from the pharmacy as well as request counseling from the pharmacy team as needed.   - sildenafil (VIAGRA) 100 MG tablet; Take 1 tablet (100 mg total) by mouth daily as  needed for erectile dysfunction.  Dispense: 20 tablet; Refill: 5

## 2015-06-11 ENCOUNTER — Emergency Department: Payer: Medicaid Other

## 2015-06-11 ENCOUNTER — Emergency Department
Admission: EM | Admit: 2015-06-11 | Discharge: 2015-06-11 | Disposition: A | Discharge status: Home / Self Care | Payer: Medicaid Other | Attending: Emergency Medicine | Admitting: Emergency Medicine

## 2015-06-11 ENCOUNTER — Encounter: Payer: Self-pay | Admitting: Emergency Medicine

## 2015-06-11 DIAGNOSIS — I10 Essential (primary) hypertension: Secondary | ICD-10-CM | POA: Insufficient documentation

## 2015-06-11 DIAGNOSIS — F319 Bipolar disorder, unspecified: Secondary | ICD-10-CM | POA: Diagnosis not present

## 2015-06-11 DIAGNOSIS — E7800 Pure hypercholesterolemia, unspecified: Secondary | ICD-10-CM | POA: Diagnosis not present

## 2015-06-11 DIAGNOSIS — Z7982 Long term (current) use of aspirin: Secondary | ICD-10-CM | POA: Insufficient documentation

## 2015-06-11 DIAGNOSIS — Z79899 Other long term (current) drug therapy: Secondary | ICD-10-CM | POA: Insufficient documentation

## 2015-06-11 DIAGNOSIS — J449 Chronic obstructive pulmonary disease, unspecified: Secondary | ICD-10-CM | POA: Diagnosis not present

## 2015-06-11 DIAGNOSIS — F17210 Nicotine dependence, cigarettes, uncomplicated: Secondary | ICD-10-CM | POA: Diagnosis not present

## 2015-06-11 DIAGNOSIS — M25562 Pain in left knee: Secondary | ICD-10-CM | POA: Insufficient documentation

## 2015-06-11 DIAGNOSIS — F209 Schizophrenia, unspecified: Secondary | ICD-10-CM | POA: Diagnosis not present

## 2015-06-11 MED ORDER — OXYCODONE-ACETAMINOPHEN 10-325 MG PO TABS: 1.0000 | ORAL_TABLET | Freq: Four times a day (QID) | ORAL | Status: DC | PRN

## 2015-06-11 MED ORDER — HYDROMORPHONE HCL 1 MG/ML IJ SOLN
1.0000 mg | Freq: Once | INTRAMUSCULAR | Status: DC
  Filled 2015-06-11: qty 1

## 2015-06-11 MED ORDER — ONDANSETRON 4 MG PO TBDP
ORAL_TABLET | ORAL | Status: AC
  Administered 2015-06-11: 4 mg via ORAL
  Filled 2015-06-11: qty 1

## 2015-06-11 MED ORDER — ONDANSETRON HCL 4 MG/2ML IJ SOLN: 4.0000 mg | Freq: Once | INTRAMUSCULAR | Status: DC

## 2015-06-11 MED ORDER — ONDANSETRON 4 MG PO TBDP
4.0000 mg | ORAL_TABLET | Freq: Once | ORAL | Status: AC
  Administered 2015-06-11: 4 mg via ORAL

## 2015-06-11 MED ORDER — HYDROMORPHONE HCL 1 MG/ML IJ SOLN
1.0000 mg | Freq: Once | INTRAMUSCULAR | Status: AC
Start: 2015-06-11 — End: 2015-06-11
  Administered 2015-06-11: 1 mg via INTRAMUSCULAR

## 2015-06-11 MED ORDER — DIAZEPAM 5 MG/ML IJ SOLN
5.0000 mg | Freq: Once | INTRAMUSCULAR | Status: AC
  Administered 2015-06-11: 5 mg via INTRAMUSCULAR
  Filled 2015-06-11: qty 2

## 2015-06-11 NOTE — ED Notes (Signed)
States he is scheduled for knee surgery in 5 weeks and out of pain meds

## 2015-06-11 NOTE — Discharge Instructions (Signed)
Cryotherapy °Cryotherapy means treatment with cold. Ice or gel packs can be used to reduce both pain and swelling. Ice is the most helpful within the first 24 to 48 hours after an injury or flare-up from overusing a muscle or joint. Sprains, strains, spasms, burning pain, shooting pain, and aches can all be eased with ice. Ice can also be used when recovering from surgery. Ice is effective, has very few side effects, and is safe for most people to use. °PRECAUTIONS  °Ice is not a safe treatment option for people with: °· Raynaud phenomenon. This is a condition affecting small blood vessels in the extremities. Exposure to cold may cause your problems to return. °· Cold hypersensitivity. There are many forms of cold hypersensitivity, including: °· Cold urticaria. Red, itchy hives appear on the skin when the tissues begin to warm after being iced. °· Cold erythema. This is a red, itchy rash caused by exposure to cold. °· Cold hemoglobinuria. Red blood cells break down when the tissues begin to warm after being iced. The hemoglobin that carry oxygen are passed into the urine because they cannot combine with blood proteins fast enough. °· Numbness or altered sensitivity in the area being iced. °If you have any of the following conditions, do not use ice until you have discussed cryotherapy with your caregiver: °· Heart conditions, such as arrhythmia, angina, or chronic heart disease. °· High blood pressure. °· Healing wounds or open skin in the area being iced. °· Current infections. °· Rheumatoid arthritis. °· Poor circulation. °· Diabetes. °Ice slows the blood flow in the region it is applied. This is beneficial when trying to stop inflamed tissues from spreading irritating chemicals to surrounding tissues. However, if you expose your skin to cold temperatures for too long or without the proper protection, you can damage your skin or nerves. Watch for signs of skin damage due to cold. °HOME CARE INSTRUCTIONS °Follow  these tips to use ice and cold packs safely. °· Place a dry or damp towel between the ice and skin. A damp towel will cool the skin more quickly, so you may need to shorten the time that the ice is used. °· For a more rapid response, add gentle compression to the ice. °· Ice for no more than 10 to 20 minutes at a time. The bonier the area you are icing, the less time it will take to get the benefits of ice. °· Check your skin after 5 minutes to make sure there are no signs of a poor response to cold or skin damage. °· Rest 20 minutes or more between uses. °· Once your skin is numb, you can end your treatment. You can test numbness by very lightly touching your skin. The touch should be so light that you do not see the skin dimple from the pressure of your fingertip. When using ice, most people will feel these normal sensations in this order: cold, burning, aching, and numbness. °· Do not use ice on someone who cannot communicate their responses to pain, such as small children or people with dementia. °HOW TO MAKE AN ICE PACK °Ice packs are the most common way to use ice therapy. Other methods include ice massage, ice baths, and cryosprays. Muscle creams that cause a cold, tingly feeling do not offer the same benefits that ice offers and should not be used as a substitute unless recommended by your caregiver. °To make an ice pack, do one of the following: °· Place crushed ice or a   bag of frozen vegetables in a sealable plastic bag. Squeeze out the excess air. Place this bag inside another plastic bag. Slide the bag into a pillowcase or place a damp towel between your skin and the bag. °· Mix 3 parts water with 1 part rubbing alcohol. Freeze the mixture in a sealable plastic bag. When you remove the mixture from the freezer, it will be slushy. Squeeze out the excess air. Place this bag inside another plastic bag. Slide the bag into a pillowcase or place a damp towel between your skin and the bag. °SEEK MEDICAL CARE  IF: °· You develop white spots on your skin. This may give the skin a blotchy (mottled) appearance. °· Your skin turns blue or pale. °· Your skin becomes waxy or hard. °· Your swelling gets worse. °MAKE SURE YOU:  °· Understand these instructions. °· Will watch your condition. °· Will get help right away if you are not doing well or get worse. °  °This information is not intended to replace advice given to you by your health care provider. Make sure you discuss any questions you have with your health care provider. °  °Document Released: 09/27/2010 Document Revised: 02/21/2014 Document Reviewed: 09/27/2010 °Elsevier Interactive Patient Education ©2016 [employer] ° °Heat Therapy °Heat therapy can help ease sore, stiff, injured, and tight muscles and joints. Heat relaxes your muscles, which may help ease your pain.  °RISKS AND COMPLICATIONS °If you have any of the following conditions, do not use heat therapy unless your health care provider has approved: °· Poor circulation. °· Healing wounds or scarred skin in the area being treated. °· Diabetes, heart disease, or high blood pressure. °· Not being able to feel (numbness) the area being treated. °· Unusual swelling of the area being treated. °· Active infections. °· Blood clots. °· Cancer. °· Inability to communicate pain. This may include young children and people who have problems with their brain function (dementia). °· Pregnancy. °Heat therapy should only be used on old, pre-existing, or long-lasting (chronic) injuries. Do not use heat therapy on new injuries unless directed by your health care provider. °HOW TO USE HEAT THERAPY °There are several different kinds of heat therapy, including: °· Moist heat pack. °· Warm water bath. °· Hot water bottle. °· Electric heating pad. °· Heated gel pack. °· Heated wrap. °· Electric heating pad. °Use the heat therapy method suggested by your health care provider. Follow your health care provider's instructions on when  and how to use heat therapy. °GENERAL HEAT THERAPY RECOMMENDATIONS °· Do not sleep while using heat therapy. Only use heat therapy while you are awake. °· Your skin may turn pink while using heat therapy. Do not use heat therapy if your skin turns red. °· Do not use heat therapy if you have new pain. °· High heat or long exposure to heat can cause burns. Be careful when using heat therapy to avoid burning your skin. °· Do not use heat therapy on areas of your skin that are already irritated, such as with a rash or sunburn. °SEEK MEDICAL CARE IF: °· You have blisters, redness, swelling, or numbness. °· You have new pain. °· Your pain is worse. °MAKE SURE YOU: °· Understand these instructions. °· Will watch your condition. °· Will get help right away if you are not doing well or get worse. °  °This information is not intended to replace advice given to you by your health care provider. Make sure you discuss any questions you   have with your health care provider. °  °Document Released: 04/25/2011 Document Revised: 02/21/2014 Document Reviewed: 03/26/2013 °Elsevier Interactive Patient Education ©2016 [employer] ° °Knee Pain °Knee pain is a very common symptom and can have many causes. Knee pain often goes away when you follow your health care provider's instructions for relieving pain and discomfort at home. However, knee pain can develop into a condition that needs treatment. Some conditions may include: °· Arthritis caused by wear and tear (osteoarthritis). °· Arthritis caused by swelling and irritation (rheumatoid arthritis or gout). °· A cyst or growth in your knee. °· An infection in your knee joint. °· An injury that will not heal. °· Damage, swelling, or irritation of the tissues that support your knee (torn ligaments or tendinitis). °If your knee pain continues, additional tests may be ordered to diagnose your condition. Tests may include X-rays or other imaging studies of your knee. You may also need to have  fluid removed from your knee. Treatment for ongoing knee pain depends on the cause, but treatment may include: °· Medicines to relieve pain or swelling. °· Steroid injections in your knee. °· Physical therapy. °· Surgery. °HOME CARE INSTRUCTIONS °· Take medicines only as directed by your health care provider. °· Rest your knee and keep it raised (elevated) while you are resting. °· Do not do things that cause or worsen pain. °· Avoid high-impact activities or exercises, such as running, jumping rope, or doing jumping jacks. °· Apply ice to the knee area: °¨ Put ice in a plastic bag. °¨ Place a towel between your skin and the bag. °¨ Leave the ice on for 20 minutes, 2-3 times a day. °· Ask your health care provider if you should wear an elastic knee support. °· Keep a pillow under your knee when you sleep. °· Lose weight if you are overweight. Extra weight can put pressure on your knee. °· Do not use any tobacco products, including cigarettes, chewing tobacco, or electronic cigarettes. If you need help quitting, ask your health care provider. Smoking may slow the healing of any bone and joint problems that you may have. °SEEK MEDICAL CARE IF: °· Your knee pain continues, changes, or gets worse. °· You have a fever along with knee pain. °· Your knee buckles or locks up. °· Your knee becomes more swollen. °SEEK IMMEDIATE MEDICAL CARE IF:  °· Your knee joint feels hot to the touch. °· You have chest pain or trouble breathing. °  °This information is not intended to replace advice given to you by your health care provider. Make sure you discuss any questions you have with your health care provider. °  °Document Released: 11/28/2006 Document Revised: 02/21/2014 Document Reviewed: 09/16/2013 °Elsevier Interactive Patient Education ©2016 [employer] ° °

## 2015-06-11 NOTE — ED Provider Notes (Signed)
[facility] Emergency Department Provider Note  ____________________________________________  Time seen: Approximately 11:04 AM  I have reviewed the triage vital signs and the nursing notes.   HISTORY  Chief Complaint Knee Pain    HPI Scott Howell is a 45 y.o. male presents for evaluation of continued left knee pain. Patient states that he scheduled for knee surgery and 5 weeks and is out of pain medication. Patient states he's been taking Tylenol 3's with no relief. Patient reports recent removal of hardware from previous fracture. States that he fell during rehabilitation and landed on his knee. Now continues of complains of continued pain. Reports pain is 10 over 10. Unable to move. He presents with a knee Velcro brace intact.Patient reports taking #100 Tylenol 3's in the past 16 days.   Past Medical History  Diagnosis Date  . Schizophrenia (HCC)   . Bipolar 1 disorder (HCC)   . COPD (chronic obstructive pulmonary disease) (HCC)   . Hypertension   . High cholesterol   . GERD (gastroesophageal reflux disease)   . Stroke [facility])     Patient Active Problem List   Diagnosis Date Noted  . Erectile dysfunction 04/22/2015  . Status post hardware removal 04/20/2015  . Homicidal ideation 04/06/2015  . Alcohol abuse 04/06/2015  . Bipolar 2 disorder (HCC) 07/29/2014  . Marijuana abuse 07/29/2014  . Chronic knee pain 07/29/2014  . Bipolar 1 disorder, mixed (HCC)   . HLD (hyperlipidemia) 10/11/2013  . Drug abuse, opioid type 06/28/2013  . 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 GERD without esophagitis 10/03/2012  . Hypertension goal BP (blood pressure) < 140/90 10/03/2012  . Injury of leg 10/03/2012  . Chronic pain associated with significant psychosocial dysfunction 07/17/2012  . Other long term (current) drug therapy 07/17/2012  . Lumbar canal stenosis 04/09/2012  . Hypercholesterolemia 01/06/2012  . CN (constipation) 11/11/2011  . 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 Decreased motor strength 09/30/2010  . Current tobacco use 09/30/2010  . H/O transient cerebral ischemia 09/09/2010  . LBP (low back pain) 08/30/2010  . Cervical spinal cord compression (HCC) 07/22/2010  . Cervical post-laminectomy syndrome 07/22/2010  . Chronic obstructive pulmonary disease (HCC) 02/23/2010    Past Surgical History  Procedure Laterality Date  . Cervical spine surgery      4  . Shoulder surgery Left   . Knee surgery Left   . Hardware removal Left 04/20/2015    Procedure: HARDWARE REMOVAL left leg;  Surgeon: Deeann Saint, MD;  Location: [facility] ORS;  Service: Orthopedics;  Laterality: Left;    Current Outpatient Rx  Name  Route  Sig  Dispense  Refill  . albuterol (PROVENTIL HFA;VENTOLIN HFA) 108 (90 Base) MCG/ACT inhaler   Inhalation   Inhale 1-2 puffs into the lungs every 6 (six) hours as needed for wheezing or shortness of breath.   18 g   5   . aspirin 81 MG tablet   Oral   Take 81 mg by mouth.         . beclomethasone (QVAR) 40 MCG/ACT inhaler   Inhalation   Inhale 2 puffs into the lungs 2 (two) times daily.   1 Inhaler   5   . diazepam (VALIUM) 10 MG tablet   Oral   Take 5 mg by mouth 3 (three) times daily as needed.          . diphenhydrAMINE (BENADRYL) 50 MG capsule   Oral   Take 50 mg by mouth at bedtime  as needed.         . DULoxetine (CYMBALTA) 60 MG capsule   Oral   Take 120 mg by mouth daily.          . hydrochlorothiazide (HYDRODIURIL) 25 MG tablet   Oral   Take 1 tablet (25 mg total) by mouth daily.   30 tablet   5   . meloxicam (MOBIC) 15 MG tablet   Oral   Take 1 tablet (15 mg total) by mouth daily.   30 tablet   5   . omeprazole (PRILOSEC) 20 MG capsule   Oral   Take 1 capsule (20 mg total) by mouth daily.   30 capsule   5   . oxyCODONE-acetaminophen (PERCOCET) 10-325 MG tablet   Oral   Take 1 tablet by mouth every 6 (six) hours as needed for pain.   20 tablet   0   . QUEtiapine  (SEROQUEL) 50 MG tablet   Oral   Take 100-200 mg by mouth 4 (four) times daily. Pt takes one tablet three times a day and two tablets at bedtime.         . sildenafil (VIAGRA) 100 MG tablet   Oral   Take 1 tablet (100 mg total) by mouth daily as needed for erectile dysfunction.   20 tablet   5     Allergies Acetaminophen  Family History  Problem Relation Age of Onset  . Diabetes Mother   . Hyperlipidemia Mother   . Hypertension Mother   . Diabetes Maternal Aunt   . Cancer Maternal Aunt   . Hyperlipidemia Maternal Aunt   . Hypertension Maternal Aunt   . Diabetes Maternal Uncle   . Cancer Maternal Uncle   . Hyperlipidemia Maternal Uncle   . Hypertension Maternal Uncle     Social History Social History  Substance Use Topics  . Smoking status: Current Every Day Smoker    Types: Cigarettes  . Smokeless tobacco: Never Used  . Alcohol Use: No     Comment: Sober for 12 yrs.    Review of Systems Constitutional: No fever/chills Eyes: No visual changes. ENT: No sore throat. Cardiovascular: Denies chest pain. Respiratory: Denies shortness of breath. Gastrointestinal: No abdominal pain.  No nausea, no vomiting.  No diarrhea.  No constipation. Genitourinary: Negative for dysuria. Musculoskeletal: Positive for left knee pain. Skin: Negative for rash. Neurological: Negative for headaches, focal weakness or numbness.  10-point ROS otherwise negative.  ____________________________________________   PHYSICAL EXAM: BP 121/70 mmHg  Pulse 78  Temp(Src) 99.1 F (37.3 C) (Oral)  Resp 20  Ht  (1.854 m)  Wt 106.595 kg  BMI 31.01 kg/m2  SpO2 98%  VITAL SIGNS: ED Triage Vitals  Enc Vitals Group     BP --      Pulse --      Resp --      Temp --      Temp src --      SpO2 --      Weight --      Height --      Head Cir --      Peak Flow --      Pain Score --      Pain Loc --      Pain Edu? --      Excl. in GC? --     Constitutional: Alert and oriented.  Well appearing and in no acute distress. Musculoskeletal: Left knee with point tenderness. No obvious ecchymosis or  bruising. Positive warmth and edema noted. Neurologic:  Normal speech and language. No gross focal neurologic deficits are appreciated. No gait instability. Skin:  Skin is warm, dry and intact. No rash noted. Psychiatric: Mood and affect are normal. Speech and behavior are normal.  ____________________________________________   LABS (all labs ordered are listed, but only abnormal results are displayed)  Labs Reviewed - No data to display   RADIOLOGY  No acute osseous findings positive for joint effusion. Operative scar noted. ____________________________________________   PROCEDURES  Procedure(s) performed: None  Critical Care performed: No  ____________________________________________   INITIAL IMPRESSION / ASSESSMENT AND PLAN / ED COURSE  Pertinent labs & imaging results that were available during my care of the patient were reviewed by me and considered in my medical decision making (see chart for details).  Recurrent left knee pain. Rx given for Percocet 10/325. Patient follow-up with Dr. Hyacinth MeekerMiller as scheduled. Patient voices no other emergency medical complaints at this time. He feels improved with 1 mg of Dilaudid and 5 mg of Valium IM. ____________________________________________   FINAL CLINICAL IMPRESSION(S) / ED DIAGNOSES  Final diagnoses:  Knee pain, acute, left     This chart was dictated using voice recognition software/Dragon. Despite best efforts to proofread, errors can occur which can change the meaning. Any change was purely unintentional.   Evangeline Dakinharles M Beers, PA-C 06/11/15 1312  Sharyn CreamerMark Quale, MD 06/11/15 [phone]

## 2015-08-09 ENCOUNTER — Emergency Department
Admission: EM | Admit: 2015-08-09 | Discharge: 2015-08-09 | Disposition: A | Discharge status: Home / Self Care | Payer: Medicaid Other | Attending: Emergency Medicine | Admitting: Emergency Medicine

## 2015-08-09 ENCOUNTER — Emergency Department: Payer: Medicaid Other

## 2015-08-09 DIAGNOSIS — Z8673 Personal history of transient ischemic attack (TIA), and cerebral infarction without residual deficits: Secondary | ICD-10-CM | POA: Diagnosis not present

## 2015-08-09 DIAGNOSIS — Z791 Long term (current) use of non-steroidal anti-inflammatories (NSAID): Secondary | ICD-10-CM | POA: Diagnosis not present

## 2015-08-09 DIAGNOSIS — I10 Essential (primary) hypertension: Secondary | ICD-10-CM | POA: Insufficient documentation

## 2015-08-09 DIAGNOSIS — F209 Schizophrenia, unspecified: Secondary | ICD-10-CM | POA: Insufficient documentation

## 2015-08-09 DIAGNOSIS — Y999 Unspecified external cause status: Secondary | ICD-10-CM | POA: Insufficient documentation

## 2015-08-09 DIAGNOSIS — G8929 Other chronic pain: Secondary | ICD-10-CM | POA: Diagnosis not present

## 2015-08-09 DIAGNOSIS — M1990 Unspecified osteoarthritis, unspecified site: Secondary | ICD-10-CM | POA: Insufficient documentation

## 2015-08-09 DIAGNOSIS — Y92002 Bathroom of unspecified non-institutional (private) residence single-family (private) house as the place of occurrence of the external cause: Secondary | ICD-10-CM | POA: Insufficient documentation

## 2015-08-09 DIAGNOSIS — S50811A Abrasion of right forearm, initial encounter: Secondary | ICD-10-CM | POA: Diagnosis not present

## 2015-08-09 DIAGNOSIS — J449 Chronic obstructive pulmonary disease, unspecified: Secondary | ICD-10-CM | POA: Insufficient documentation

## 2015-08-09 DIAGNOSIS — Z7951 Long term (current) use of inhaled steroids: Secondary | ICD-10-CM | POA: Insufficient documentation

## 2015-08-09 DIAGNOSIS — W1839XA Other fall on same level, initial encounter: Secondary | ICD-10-CM | POA: Insufficient documentation

## 2015-08-09 DIAGNOSIS — F17210 Nicotine dependence, cigarettes, uncomplicated: Secondary | ICD-10-CM | POA: Insufficient documentation

## 2015-08-09 DIAGNOSIS — Z7982 Long term (current) use of aspirin: Secondary | ICD-10-CM | POA: Insufficient documentation

## 2015-08-09 DIAGNOSIS — S8002XA Contusion of left knee, initial encounter: Secondary | ICD-10-CM | POA: Diagnosis not present

## 2015-08-09 DIAGNOSIS — Y939 Activity, unspecified: Secondary | ICD-10-CM | POA: Insufficient documentation

## 2015-08-09 DIAGNOSIS — M25562 Pain in left knee: Secondary | ICD-10-CM | POA: Diagnosis present

## 2015-08-09 DIAGNOSIS — Z79899 Other long term (current) drug therapy: Secondary | ICD-10-CM | POA: Insufficient documentation

## 2015-08-09 DIAGNOSIS — E785 Hyperlipidemia, unspecified: Secondary | ICD-10-CM | POA: Diagnosis not present

## 2015-08-09 DIAGNOSIS — F319 Bipolar disorder, unspecified: Secondary | ICD-10-CM | POA: Diagnosis not present

## 2015-08-09 MED ORDER — OXYCODONE-ACETAMINOPHEN 5-325 MG PO TABS
1.0000 | ORAL_TABLET | ORAL | Status: DC | PRN
Start: 2015-08-09 — End: 2015-08-09
  Administered 2015-08-09: 1 via ORAL
  Filled 2015-08-09: qty 1

## 2015-08-09 MED ORDER — KETOROLAC TROMETHAMINE 30 MG/ML IJ SOLN
30.0000 mg | Freq: Once | INTRAMUSCULAR | Status: AC
  Administered 2015-08-09: 30 mg via INTRAMUSCULAR
  Filled 2015-08-09: qty 1

## 2015-08-09 NOTE — ED Notes (Signed)
Non stick dressing and gauze applied to right arm superficial lacerations.

## 2015-08-09 NOTE — ED Notes (Signed)
Pt reports approximately 30 mins ago he got up to go to the bathroom and his left knee gave out and he fell on his left knee. Pt reports pain and swelling since. Pt has bruising noted to the area. Pt reports he is supposed to have TKR on that knee.

## 2015-08-09 NOTE — ED Notes (Signed)
Pt rolling himself around lobby in wheelchair; has been outside again and then to phone; pt heard calling his mother to bring him cigarettes--"I'm about to die for one";

## 2015-08-09 NOTE — ED Notes (Signed)
See triage note   States he fell last pm  Landed on left knee. states he is not able to bear wt d/t increased pain. States he has an appt to f/u with PCP and ortho concerning knee surgery and pain management.

## 2015-08-09 NOTE — ED Notes (Signed)
After wheeling himself in a wheelchair out to smoke, and then back inside to use the phone, pt assisted to chair in lobby to prop up left knee;

## 2015-08-09 NOTE — ED Provider Notes (Signed)
[facility] Emergency Department Provider Note  ____________________________________________  Time seen: Approximately 7:39 AM  I have reviewed the triage vital signs and the nursing notes.   HISTORY  Chief Complaint Fall and Knee Pain    HPI Scott Howell is a 45 y.o. male , NAD, presents to the emergency department with several hour history of left knee pain. States that he woke up around 3:30 this morning to go the restroom and his left knee "gave out" and he fell onto the left knee. Patient notes that he has had increasing pain and swelling about the left knee since that time. Also notes that he had abrasions to his right arm during the fall. States he has chronic pain and arthritis about the left knee in which he needs a total knee replacement and is working on that at this time. Patient has also been referred to pain management clinic for management of his chronic pain. Has been given narcotic pain medications in the past by his primary care provider but states that they will no longer prescribe for him and have referred him to pain management for further medications. Patient denies any numbness, weakness, tingling. Denies cell paresthesias nor back pain. Has not taken anything over-the-counter for his pain but was given a Percocet and upon arrival to the ED earlier this morning.   Past Medical History  Diagnosis Date  . Schizophrenia (HCC)   . Bipolar 1 disorder (HCC)   . COPD (chronic obstructive pulmonary disease) (HCC)   . Hypertension   . High cholesterol   . GERD (gastroesophageal reflux disease)   . Stroke [facility](HCC)     Patient Active Problem List   Diagnosis Date Noted  . Erectile dysfunction 04/22/2015  . Status post hardware removal 04/20/2015  . Homicidal ideation 04/06/2015  . Alcohol abuse 04/06/2015  . Bipolar 2 disorder (HCC) 07/29/2014  . Marijuana abuse 07/29/2014  . Chronic knee pain 07/29/2014  . Bipolar 1 disorder, mixed (HCC)   .  HLD (hyperlipidemia) 10/11/2013  . Drug abuse, opioid type 06/28/2013  . 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 GERD without esophagitis 10/03/2012  . Hypertension goal BP (blood pressure) < 140/90 10/03/2012  . Injury of leg 10/03/2012  . Chronic pain associated with significant psychosocial dysfunction 07/17/2012  . Other long term (current) drug therapy 07/17/2012  . Lumbar canal stenosis 04/09/2012  . Hypercholesterolemia 01/06/2012  . CN (constipation) 11/11/2011  . 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 Decreased motor strength 09/30/2010  . Current tobacco use 09/30/2010  . H/O transient cerebral ischemia 09/09/2010  . LBP (low back pain) 08/30/2010  . Cervical spinal cord compression (HCC) 07/22/2010  . Cervical post-laminectomy syndrome 07/22/2010  . Chronic obstructive pulmonary disease (HCC) 02/23/2010    Past Surgical History  Procedure Laterality Date  . Cervical spine surgery      4  . Shoulder surgery Left   . Knee surgery Left   . Hardware removal Left 04/20/2015    Procedure: HARDWARE REMOVAL left leg;  Surgeon: Deeann SaintHoward Miller, MD;  Location: [facility] ORS;  Service: Orthopedics;  Laterality: Left;    Current Outpatient Rx  Name  Route  Sig  Dispense  Refill  . albuterol (PROVENTIL HFA;VENTOLIN HFA) 108 (90 Base) MCG/ACT inhaler   Inhalation   Inhale 1-2 puffs into the lungs every 6 (six) hours as needed for wheezing or shortness of breath.   18 g   5   . aspirin 81 MG tablet   Oral  Take 81 mg by mouth.         . beclomethasone (QVAR) 40 MCG/ACT inhaler   Inhalation   Inhale 2 puffs into the lungs 2 (two) times daily.   1 Inhaler   5   . diazepam (VALIUM) 10 MG tablet   Oral   Take 5 mg by mouth 3 (three) times daily as needed.          . diphenhydrAMINE (BENADRYL) 50 MG capsule   Oral   Take 50 mg by mouth at bedtime as needed.         . DULoxetine (CYMBALTA) 60 MG capsule   Oral   Take 120 mg by mouth daily.          .  hydrochlorothiazide (HYDRODIURIL) 25 MG tablet   Oral   Take 1 tablet (25 mg total) by mouth daily.   30 tablet   5   . meloxicam (MOBIC) 15 MG tablet   Oral   Take 1 tablet (15 mg total) by mouth daily.   30 tablet   5   . omeprazole (PRILOSEC) 20 MG capsule   Oral   Take 1 capsule (20 mg total) by mouth daily.   30 capsule   5   . oxyCODONE-acetaminophen (PERCOCET) 10-325 MG tablet   Oral   Take 1 tablet by mouth every 6 (six) hours as needed for pain.   20 tablet   0   . QUEtiapine (SEROQUEL) 50 MG tablet   Oral   Take 100-200 mg by mouth 4 (four) times daily. Pt takes one tablet three times a day and two tablets at bedtime.         . sildenafil (VIAGRA) 100 MG tablet   Oral   Take 1 tablet (100 mg total) by mouth daily as needed for erectile dysfunction.   20 tablet   5     Allergies Acetaminophen  Family History  Problem Relation Age of Onset  . Diabetes Mother   . Hyperlipidemia Mother   . Hypertension Mother   . Diabetes Maternal Aunt   . Cancer Maternal Aunt   . Hyperlipidemia Maternal Aunt   . Hypertension Maternal Aunt   . Diabetes Maternal Uncle   . Cancer Maternal Uncle   . Hyperlipidemia Maternal Uncle   . Hypertension Maternal Uncle     Social History Social History  Substance Use Topics  . Smoking status: Current Every Day Smoker    Types: Cigarettes  . Smokeless tobacco: Never Used  . Alcohol Use: No     Comment: Sober for 12 yrs.     Review of Systems  Constitutional: No fever/chills Eyes: No visual changes.  Cardiovascular: No chest pain. Respiratory:  No shortness of breath.  Musculoskeletal: Positive left knee pain. Negative for back pain.  Skin: Positive swelling left knee. Positive for bruising left knee. Positive abrasions right forearm. Negative for rash. Neurological: Negative for headaches, focal weakness or numbness. No tingling. No saddle paresthesias. 10-point ROS otherwise  negative.  ____________________________________________   PHYSICAL EXAM:  VITAL SIGNS: ED Triage Vitals  Enc Vitals Group     BP 08/09/15 0412 126/66 mmHg     Pulse Rate 08/09/15 0412 85     Resp 08/09/15 0412 18     Temp 08/09/15 0412 97.6 F (36.4 C)     Temp Source 08/09/15 0412 Oral     SpO2 08/09/15 0412 97 %     Weight 08/09/15 0412 240 lb (108.863 kg)  Height 08/09/15 0412 6\' 1"  (1.854 m)     Head Cir --      Peak Flow --      Pain Score --      Pain Loc --      Pain Edu? --      Excl. in GC? --      Constitutional: Alert and oriented. Well appearing and in no acute distress. Eyes: Conjunctivae are normal.  Head: Atraumatic. Neck: Supple with full range of motion Hematological/Lymphatic/Immunilogical: No cervical lymphadenopathy. Cardiovascular:   Good peripheral circulation with 2+ pulses noted about the left lower extremity. Respiratory: Normal respiratory effort without tachypnea or retractions. Musculoskeletal: Diffuse tenderness to palpation about the anterior left knee. No laxity with anterior posterior drawer. No laxity with varus or varus stress. No effusion appreciated to palpation of the left knee. No lower extremity tenderness nor edema. Full range of motion of the right upper extremity without pain. Neurologic:  Normal speech and language. No gross focal neurologic deficits are appreciated.  Skin:  2 erythematous bruises noted about the anterior left knee. Multiple superficial abrasions noted about the left forearm. Bleeding is controlled. Skin is warm, dry. No rash noted. Psychiatric: Mood and affect are normal. Speech and behavior are normal. Patient exhibits appropriate insight and judgement.   ____________________________________________   LABS  None ____________________________________________  EKG  None ____________________________________________  RADIOLOGY I have personally viewed and evaluated these images (plain radiographs) as  part of my medical decision making, as well as reviewing the written report by the radiologist.  Dg Knee Complete 4 Views Left  08/09/2015  CLINICAL DATA:  Slipped going to restroom this morning. History of injury. EXAM: LEFT KNEE - COMPLETE 4+ VIEW COMPARISON:  LEFT knee radiograph June 11, 2015 FINDINGS: No acute fracture deformity or dislocation. Old impacted lateral tibial plateau fracture and heterotopic ossification. Tricompartmental mild marginal spurring compatible with osteoarthrosis. No destructive bony lesions. Soft tissue planes are nonsuspicious. IMPRESSION: No acute fracture deformity or dislocation. Old lateral tibial plateau fracture. Electronically Signed   By: Awilda Metroourtnay  Bloomer M.D.   On: 08/09/2015 05:16    ____________________________________________    PROCEDURES  Procedure(s) performed: None    Medications  oxyCODONE-acetaminophen (PERCOCET/ROXICET) 5-325 MG per tablet 1 tablet (1 tablet Oral Given 08/09/15 0424)  ketorolac (TORADOL) 30 MG/ML injection 30 mg (30 mg Intramuscular Given 08/09/15 0757)     ____________________________________________   INITIAL IMPRESSION / ASSESSMENT AND PLAN / ED COURSE  Pertinent imaging results that were available during my care of the patient were reviewed by me and considered in my medical decision making (see chart for details).  Patient's diagnosis is consistent with contusion of left knee, abrasion right forearm and chronic pain. Patient will be discharged home with instructions for home care to include ice to the affected area 20 minutes 3-4 times daily as well as light range of motion stretching of the need to improve range of motion and to decrease pain. Patient is to follow up with his primary care provider or Dr. Hyacinth MeekerMiller in orthopedics if symptoms persist past this treatment course. Encouraged patient to continue referral process with pain management center for management of chronic pain. Patient is given ED precautions to  return to the ED for any worsening or new symptoms.    ____________________________________________  FINAL CLINICAL IMPRESSION(S) / ED DIAGNOSES  Final diagnoses:  Contusion of left knee, initial encounter  Abrasion of right forearm, initial encounter  Chronic pain      NEW MEDICATIONS STARTED  DURING THIS VISIT:  Discharge Medication List as of 08/09/2015  8:06 AM           Hope Pigeon, PA-C 08/09/15 1109  Jennye Moccasin, MD 08/09/15 1228

## 2015-08-09 NOTE — Discharge Instructions (Signed)
Abrasion An abrasion is a cut or scrape on the surface of your skin. An abrasion does not go through all of the layers of your skin. It is important to take good care of your abrasion to prevent infection. HOME CARE Medicines  Take or apply medicines only as told by your doctor.  If you were prescribed an antibiotic ointment, finish all of it even if you start to feel better. Wound Care  Clean the wound with mild soap and water 2-3 times per day or as told by your doctor. Pat your wound dry with a clean towel. Do not rub it.  There are many ways to close and cover a wound. Follow instructions from your doctor about:  How to take care of your wound.  When and how you should change your bandage (dressing).  When and how you should take off your dressing.  Check your wound every day for signs of infection. Watch for:  Redness, swelling, or pain.  Fluid, blood, or pus. General Instructions  Keep the dressing dry as told by your doctor. Do not take baths, swim, use a hot tub, or do anything that would put your wound underwater until your doctor says it is okay.  If there is swelling, raise (elevate) the injured area above the level of your heart while you are sitting or lying down.  Keep all follow-up visits as told by your doctor. This is important. GET HELP IF:  You were given a tetanus shot and you have any of these where the needle went in:  Swelling.  Very bad pain.  Redness.  Bleeding.  Medicine does not help your pain.  You have any of these at the site of the wound:  More redness.  More swelling.  More pain. GET HELP RIGHT AWAY IF:  You have a red streak going away from your wound.  You have a fever.  You have fluid, blood, or pus coming from your wound.  There is a bad smell coming from your wound.   This information is not intended to replace advice given to you by your health care provider. Make sure you discuss any questions you have with your  health care provider.   Document Released: 07/20/2007 Document Revised: 06/17/2014 Document Reviewed: 01/29/2014 Elsevier Interactive Patient Education 2016 Elsevier Inc[employer]  Wound Care Taking care of your wound properly can help to prevent pain and infection. It can also help your wound to heal more quickly.  HOW TO CARE FOR YOUR WOUND  Take or apply over-the-counter and prescription medicines only as told by your health care provider.  If you were prescribed antibiotic medicine, take or apply it as told by your health care provider. Do not stop using the antibiotic even if your condition improves.  Clean the wound each day or as told by your health care provider.  Wash the wound with mild soap and water.  Rinse the wound with water to remove all soap.  Pat the wound dry with a clean towel. Do not rub it.  There are many different ways to close and cover a wound. For example, a wound can be covered with stitches (sutures), skin glue, or adhesive strips. Follow instructions from your health care provider about:  How to take care of your wound.  When and how you should change your bandage (dressing).  When you should remove your dressing.  Removing whatever was used to close your wound.  Check your wound every day for signs of infection.  Watch for:  Redness, swelling, or pain.  Fluid, blood, or pus.  Keep the dressing dry until your health care provider says it can be removed. Do not take baths, swim, use a hot tub, or do anything that would put your wound underwater until your health care provider approves.  Raise (elevate) the injured area above the level of your heart while you are sitting or lying down.  Do not scratch or pick at the wound.  Keep all follow-up visits as told by your health care provider. This is important. SEEK MEDICAL CARE IF:  You received a tetanus shot and you have swelling, severe pain, redness, or bleeding at the injection site.  You have a  fever.  Your pain is not controlled with medicine.  You have increased redness, swelling, or pain at the site of your wound.  You have fluid, blood, or pus coming from your wound.  You notice a bad smell coming from your wound or your dressing. SEEK IMMEDIATE MEDICAL CARE IF:  You have a red streak going away from your wound.   This information is not intended to replace advice given to you by your health care provider. Make sure you discuss any questions you have with your health care provider.   Document Released: 11/10/2007 Document Revised: 06/17/2014 Document Reviewed: 01/27/2014 Elsevier Interactive Patient Education 2016 Elsevier Inc.  Cryotherapy Cryotherapy is when you put ice on your injury. Ice helps lessen pain and puffiness (swelling) after an injury. Ice works the best when you start using it in the first 24 to 48 hours after an injury. HOME CARE  Put a dry or damp towel between the ice pack and your skin.  You may press gently on the ice pack.  Leave the ice on for no more than 10 to 20 minutes at a time.  Check your skin after 5 minutes to make sure your skin is okay.  Rest at least 20 minutes between ice pack uses.  Stop using ice when your skin loses feeling (numbness).  Do not use ice on someone who cannot tell you when it hurts. This includes small children and people with memory problems (dementia). GET HELP RIGHT AWAY IF:  You have white spots on your skin.  Your skin turns blue or pale.  Your skin feels waxy or hard.  Your puffiness gets worse. MAKE SURE YOU:   Understand these instructions.  Will watch your condition.  Will get help right away if you are not doing well or get worse.   This information is not intended to replace advice given to you by your health care provider. Make sure you discuss any questions you have with your health care provider.   Document Released: 07/20/2007 Document Revised: 04/25/2011 Document Reviewed:  09/23/2010 Elsevier Interactive Patient Education 2016 Elsevier Inc.  Contusion A contusion is a deep bruise. Contusions happen when an injury causes bleeding under the skin. Symptoms of bruising include pain, swelling, and discolored skin. The skin may turn blue, purple, or yellow. HOME CARE   Rest the injured area.  If told, put ice on the injured area.  Put ice in a plastic bag.  Place a towel between your skin and the bag.  Leave the ice on for 20 minutes, 2-3 times per day.  If told, put light pressure (compression) on the injured area using an elastic bandage. Make sure the bandage is not too tight. Remove it and put it back on as told by your doctor.  If possible, raise (elevate) the injured area above the level of your heart while you are sitting or lying down.  Take over-the-counter and prescription medicines only as told by your doctor. GET HELP IF:  Your symptoms do not get better after several days of treatment.  Your symptoms get worse.  You have trouble moving the injured area. GET HELP RIGHT AWAY IF:   You have very bad pain.  You have a loss of feeling (numbness) in a hand or foot.  Your hand or foot turns pale or cold.   This information is not intended to replace advice given to you by your health care provider. Make sure you discuss any questions you have with your health care provider.   Document Released: 07/20/2007 Document Revised: 10/22/2014 Document Reviewed: 06/18/2014 Elsevier Interactive Patient Education [employer] Elsevier Inc.

## 2015-08-20 ENCOUNTER — Encounter: Payer: Self-pay | Admitting: Family Medicine

## 2015-08-20 ENCOUNTER — Ambulatory Visit (INDEPENDENT_AMBULATORY_CARE_PROVIDER_SITE_OTHER): Payer: Medicaid Other | Admitting: Family Medicine

## 2015-08-20 VITALS — BP 120/80 | HR 110 | Temp 98.0°F | Resp 18 | Ht 73.0 in | Wt 235.7 lb

## 2015-08-20 DIAGNOSIS — Z5181 Encounter for therapeutic drug level monitoring: Secondary | ICD-10-CM | POA: Diagnosis not present

## 2015-08-20 DIAGNOSIS — M25562 Pain in left knee: Secondary | ICD-10-CM | POA: Diagnosis not present

## 2015-08-20 DIAGNOSIS — Z79899 Other long term (current) drug therapy: Secondary | ICD-10-CM

## 2015-08-20 DIAGNOSIS — E876 Hypokalemia: Secondary | ICD-10-CM

## 2015-08-20 DIAGNOSIS — F3160 Bipolar disorder, current episode mixed, unspecified: Secondary | ICD-10-CM | POA: Diagnosis not present

## 2015-08-20 DIAGNOSIS — R748 Abnormal levels of other serum enzymes: Secondary | ICD-10-CM | POA: Diagnosis not present

## 2015-08-20 DIAGNOSIS — I10 Essential (primary) hypertension: Secondary | ICD-10-CM | POA: Diagnosis not present

## 2015-08-20 DIAGNOSIS — G8929 Other chronic pain: Secondary | ICD-10-CM

## 2015-08-20 DIAGNOSIS — Z720 Tobacco use: Secondary | ICD-10-CM

## 2015-08-20 MED ORDER — LISINOPRIL 10 MG PO TABS: 10.0000 mg | ORAL_TABLET | Freq: Every day | ORAL | Status: DC

## 2015-08-20 NOTE — Progress Notes (Signed)
BP 120/80   Pulse (!) 110   Temp 98 F (36.7 C) (Oral)   Resp 18   Ht 6\' 1"  (1.854 m)   Wt 235 lb 11.2 oz (106.9 kg)   SpO2 98%   BMI 31.10 kg/m    Subjective:    Patient ID: Scott Howell, male    DOB: 01/26/1971, 45 y.o.   MRN: [id number]  HPI: Scott LoryRobert E Price is a 45 y.o. male  Chief Complaint  Patient presents with  . Follow-up    medication refills  . Knee Pain    had surgery 3 months ago. Still in pain.Cannot see Pain Management til August 17   Patient had knee surgery; Dr. Hinton Dyerlifford was his orthopaedist but Dr. Hyacinth MeekerMiller was the orthopaedist who operated on him most recently 3 months ago; he asked for pain medicine; the only thing that works is Microbiologistercocet he says; he cannot get into pain management until August  HTN; taking HCTZ for 7-8 years; he had a potassium of 2.19 July 2014; last K+ was actually only 3 in February 2017; he was taking potassium supplements until one month ago and they ran out; [facility]; he has not had labs checked since then  Chart review shows that his liver enzymes were elevated; he was not aware of this; taking tylenol #3 with codeine that was a few months ago; 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; diazepam and cymbalta and seroquel  [facility] 2/9 08/20/2015 03/25/2015 12/18/2014  Decreased Interest 0 0 0  Down, Depressed, Hopeless 0 0 0  PHQ - 2 Score 0 0 0   Relevant past medical, surgical, family and social history reviewed Past Medical History:  Diagnosis Date  . Bipolar 1 disorder (HCC)   . COPD (chronic obstructive pulmonary disease) (HCC)   . GERD (gastroesophageal reflux disease)   . High cholesterol   . Hypertension   . Schizophrenia (HCC)   . Stroke [facility](HCC)    Past Surgical History:  Procedure Laterality Date  . CERVICAL SPINE SURGERY     4  . HARDWARE REMOVAL Left 04/20/2015   Procedure: HARDWARE REMOVAL left leg;  Surgeon: Deeann SaintHoward Miller, MD;  Location: [facility] ORS;  Service: Orthopedics;   Laterality: Left;  . KNEE SURGERY Left   . SHOULDER SURGERY Left    Family History  Problem Relation Age of Onset  . Diabetes Mother   . Hyperlipidemia Mother   . Hypertension Mother   . Diabetes Maternal Aunt   . Cancer Maternal Aunt   . Hyperlipidemia Maternal Aunt   . Hypertension Maternal Aunt   . Diabetes Maternal Uncle   . Cancer Maternal Uncle   . Hyperlipidemia Maternal Uncle   . Hypertension Maternal Uncle    Social History  Substance Use Topics  . Smoking status: Current Every Day Smoker    Types: Cigarettes  . Smokeless tobacco: Never Used  . Alcohol use No     Comment: Sober for 12 yrs.   Interim medical history since last visit reviewed. Allergies and medications reviewed  Review of Systems Per HPI unless specifically indicated above     Objective:    BP 120/80   Pulse (!) 110   Temp 98 F (36.7 C) (Oral)   Resp 18   Ht 6\' 1"  (1.854 m)   Wt 235 lb 11.2 oz (106.9 kg)   SpO2 98%   BMI 31.10 kg/m     Physical  Exam  Constitutional: He appears well-developed and well-nourished. No distress.  Cardiovascular: Regular rhythm.  Tachycardia present.   Pulmonary/Chest: Effort normal and breath sounds normal.  Abdominal: He exhibits no distension.  Neurological: He is alert.  Skin: Skin is warm. No pallor.   Results for orders placed or performed during the hospital encounter of 04/20/15  CBC  Result Value Ref Range   WBC 13.2 (H) 3.8 - 10.6 K/uL   RBC 4.21 (L) 4.40 - 5.90 MIL/uL   Hemoglobin 12.9 (L) 13.0 - 18.0 g/dL   HCT 60.437.7 (L) 54.040.0 - 98.152.0 %   MCV 89.6 80.0 - 100.0 fL   MCH 30.6 26.0 - 34.0 pg   MCHC 34.2 32.0 - 36.0 g/dL   RDW 19.114.0 47.811.5 - 29.514.5 %   Platelets 229 150 - 440 K/uL  Creatinine, serum  Result Value Ref Range   Creatinine, Ser 0.91 0.61 - 1.24 mg/dL   GFR calc non Af Amer >60 >60 mL/min   GFR calc Af Amer >60 >60 mL/min      Assessment & Plan:   Problem List Items Addressed This Visit      Cardiovascular and Mediastinum    Hypertension goal BP (blood pressure) < 140/90    Stop HCTZ and start ACE-I; return in 3 weeks for reassessment of BP on new medicine and recheck K+ and Cr        Other   Medication monitoring encounter   Relevant Orders   Comprehensive metabolic panel   Lipid panel   CBC with Differential/Platelet   Hypokalemia    Want to repeat K+ level; would avoid thiazide and/or loop diuretics in this patient with hx of low K+      Relevant Orders   Magnesium   Elevated liver enzymes   Relevant Orders   Comprehensive metabolic panel   Acute Hep Panel & Hep B Surface Ab   Current tobacco use    Encouraged patient to quit; see AVS      Chronic prescription benzodiazepine use    With patient taking chronic benzos, I am not in favor of treating his pain with narcotics; I explained that I will not give him a prescription for Percocet as requested      Chronic knee pain - Primary    I explained that I won't be providing him with a prescription for Percocet today; that will need to be handled by his orthopaedist and/or his pain doctor      Bipolar 1 disorder, mixed (HCC)    Managed by [facility]       Other Visit Diagnoses   None.     Follow up plan: Return in about 3 weeks (around 09/10/2015) for blood pressure and visit with Dr. Sherie DonLada.  An after-visit summary was printed and given to the patient at check-out.  Please see the patient instructions which may contain other information and recommendations beyond what is mentioned above in the assessment and plan.  Meds ordered this encounter  Medications  . DISCONTD: lisinopril (PRINIVIL,ZESTRIL) 10 MG tablet    Sig: Take 1 tablet (10 mg total) by mouth daily. This replaces HCTZ    Dispense:  30 tablet    Refill:  0    Stop the HCTZ    Orders Placed This Encounter  Procedures  . Magnesium  . Comprehensive metabolic panel  . Lipid panel  . CBC with Differential/Platelet  . Acute Hep Panel & Hep B Surface Ab

## 2015-08-20 NOTE — Assessment & Plan Note (Signed)
[facility]

## 2015-08-20 NOTE — Patient Instructions (Addendum)
Please do work with the orthopaedist about your knee Check labs today STOP the HCTZ Start the lisinopril 10 mg daily for blood pressure Try turmeric as a natural anti-inflammatory (for pain and arthritis). It comes in capsules where you buy aspirin and fish oil, but also as a spice where you buy pepper and garlic powder. Gentle low temperature heat for 20 minutes at a time may help the knee Do talk with your therapist and psychiatrist about how your pain is affecting your mood Consider acupuncture for pain relief I do encourage you to quit smoking Call [phone] to sign up for smoking cessation classes You can call 1-800-QUIT-NOW to talk with a smoking cessation coach

## 2015-09-09 ENCOUNTER — Ambulatory Visit: Payer: Self-pay | Admitting: Family Medicine

## 2015-09-28 ENCOUNTER — Other Ambulatory Visit: Payer: Self-pay | Admitting: Family Medicine

## 2015-09-29 NOTE — Telephone Encounter (Signed)
Labs are overdue; one week supply; note to pharmacy

## 2015-10-05 ENCOUNTER — Other Ambulatory Visit: Payer: Self-pay | Admitting: Family Medicine

## 2015-10-05 DIAGNOSIS — G8929 Other chronic pain: Secondary | ICD-10-CM

## 2015-10-05 DIAGNOSIS — M25562 Pain in left knee: Principal | ICD-10-CM

## 2015-10-05 NOTE — Telephone Encounter (Signed)
Please ask pt to contact orthopaedist for this medicine (meloxicam); long-term use can cause kidney damage, so I wouldn't recommend he take it long-term; if knee still bothering him, I would want ortho to know in case they want to do xrays or consider an injection Please remind him of outstanding labs ordered in early July; thank you

## 2015-10-05 NOTE — Telephone Encounter (Signed)
Pt.notified

## 2015-10-06 ENCOUNTER — Ambulatory Visit: Payer: Medicaid Other | Admitting: Pain Medicine

## 2015-10-11 ENCOUNTER — Encounter: Payer: Self-pay | Admitting: Emergency Medicine

## 2015-10-11 ENCOUNTER — Emergency Department: Payer: Medicaid Other

## 2015-10-11 ENCOUNTER — Emergency Department
Admission: EM | Admit: 2015-10-11 | Discharge: 2015-10-11 | Disposition: A | Discharge status: Home / Self Care | Payer: Medicaid Other | Attending: Emergency Medicine | Admitting: Emergency Medicine

## 2015-10-11 DIAGNOSIS — G8929 Other chronic pain: Secondary | ICD-10-CM | POA: Insufficient documentation

## 2015-10-11 DIAGNOSIS — M25562 Pain in left knee: Secondary | ICD-10-CM

## 2015-10-11 DIAGNOSIS — I10 Essential (primary) hypertension: Secondary | ICD-10-CM | POA: Diagnosis not present

## 2015-10-11 DIAGNOSIS — J449 Chronic obstructive pulmonary disease, unspecified: Secondary | ICD-10-CM | POA: Insufficient documentation

## 2015-10-11 DIAGNOSIS — S8992XA Unspecified injury of left lower leg, initial encounter: Secondary | ICD-10-CM | POA: Diagnosis present

## 2015-10-11 DIAGNOSIS — Y939 Activity, unspecified: Secondary | ICD-10-CM | POA: Insufficient documentation

## 2015-10-11 DIAGNOSIS — X501XXA Overexertion from prolonged static or awkward postures, initial encounter: Secondary | ICD-10-CM | POA: Diagnosis not present

## 2015-10-11 DIAGNOSIS — Y999 Unspecified external cause status: Secondary | ICD-10-CM | POA: Insufficient documentation

## 2015-10-11 DIAGNOSIS — Z7982 Long term (current) use of aspirin: Secondary | ICD-10-CM | POA: Insufficient documentation

## 2015-10-11 DIAGNOSIS — Z79899 Other long term (current) drug therapy: Secondary | ICD-10-CM | POA: Diagnosis not present

## 2015-10-11 DIAGNOSIS — F17210 Nicotine dependence, cigarettes, uncomplicated: Secondary | ICD-10-CM | POA: Diagnosis not present

## 2015-10-11 DIAGNOSIS — Z8673 Personal history of transient ischemic attack (TIA), and cerebral infarction without residual deficits: Secondary | ICD-10-CM | POA: Diagnosis not present

## 2015-10-11 DIAGNOSIS — S8392XA Sprain of unspecified site of left knee, initial encounter: Secondary | ICD-10-CM | POA: Diagnosis not present

## 2015-10-11 DIAGNOSIS — Y929 Unspecified place or not applicable: Secondary | ICD-10-CM | POA: Diagnosis not present

## 2015-10-11 MED ORDER — OXYCODONE HCL 5 MG PO TABS: ORAL_TABLET | ORAL | 0 refills | Status: DC

## 2015-10-11 MED ORDER — ETODOLAC 500 MG PO TABS: 500.0000 mg | ORAL_TABLET | Freq: Two times a day (BID) | ORAL | 0 refills | Status: DC

## 2015-10-11 MED ORDER — OXYCODONE HCL 5 MG PO TABS
5.0000 mg | ORAL_TABLET | Freq: Once | ORAL | Status: AC
  Administered 2015-10-11: 5 mg via ORAL
  Filled 2015-10-11: qty 1

## 2015-10-11 NOTE — Discharge Instructions (Signed)
Use knee immobilizer for support for the next week. Call Dr. Hyacinth MeekerMiller or [place] in [place] if any continued problems with your knee. Ice and elevate as needed for swelling. Take medication only as directed. Make sure you had eaten before taking etodolac. Take Roxicodone only for severe pain
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
## 2015-10-11 NOTE — ED Triage Notes (Signed)
Pt needs total knee replacement on left knee. Last Friday twisted knee and has had increased pain. Severe pain with eight bearing.

## 2015-10-19 ENCOUNTER — Encounter: Payer: Self-pay | Admitting: Family Medicine

## 2015-10-19 DIAGNOSIS — Z79899 Other long term (current) drug therapy: Secondary | ICD-10-CM | POA: Insufficient documentation

## 2015-10-19 NOTE — Assessment & Plan Note (Signed)
Encouraged patient to quit; see AVS 

## 2015-10-19 NOTE — Assessment & Plan Note (Signed)
Want to repeat K+ level; would avoid thiazide and/or loop diuretics in this patient with hx of low K+

## 2015-10-19 NOTE — Assessment & Plan Note (Signed)
I explained that I won't be providing him with a prescription for Percocet today; that will need to be handled by his orthopaedist and/or his pain doctor

## 2015-10-19 NOTE — Assessment & Plan Note (Signed)
Stop HCTZ and start ACE-I; return in 3 weeks for reassessment of BP on new medicine and recheck K+ and Cr

## 2015-10-19 NOTE — Assessment & Plan Note (Signed)
With patient taking chronic benzos, I am not in favor of treating his pain with narcotics; I explained that I will not give him a prescription for Percocet as requested

## 2015-11-19 ENCOUNTER — Other Ambulatory Visit: Payer: Self-pay | Admitting: Family Medicine

## 2015-11-19 DIAGNOSIS — K219 Gastro-esophageal reflux disease without esophagitis: Secondary | ICD-10-CM

## 2015-11-20 ENCOUNTER — Telehealth: Payer: Self-pay | Admitting: Family Medicine

## 2015-11-20 NOTE — Telephone Encounter (Signed)
PT CALLED AND SAID THAT HE WAS OUT OF HIS BLOOD PRESSURE MEDS AND HIS ACID REFLUX MEDS. SAID THAT THE PHARM SENT IN A REQUEST ON 11-19-15 BUT HAS NOT HEARD ANYTHING. HE IS OUT OF HIS BLOOD PRESSURE MEDS AND IT IS USUALLY  MAILED IN BUT MAY HAVE TO SEND IN A FEW TO LOCAL PHARM TILL RX CAN BE MAILED TO HIM.

## 2015-11-21 NOTE — Telephone Encounter (Signed)
I reviewed chart; I saw him July; labs were ordered and he was supposed to return 3 weeks later because medicine was changed I sent note to staff to request he get labs done ASAP and please schedule an appt I did approve a week's worth of medicine to allow him a few days to get bloodwork done This is very important; his previous labs in February and March were abnormal, and he never got them rechecked in July as requested

## 2015-11-21 NOTE — Telephone Encounter (Signed)
Labs and appt needed I reviewed July note; he was supposed to get labs done, and then return 3 weeks later for a visit I sent in a week's worth of medicine to give him a few days to come in for labs, but we really need those and need to see him back; his medicines were changed Thank you

## 2015-11-23 NOTE — Telephone Encounter (Signed)
Patient notified but states he does not drive and cannot come in until nov 2 on his appt.

## 2015-11-25 MED ORDER — AMLODIPINE BESYLATE 5 MG PO TABS: 5.0000 mg | ORAL_TABLET | Freq: Every day | ORAL | 0 refills | Status: DC

## 2015-11-25 MED ORDER — RANITIDINE HCL 150 MG PO TABS: 150.0000 mg | ORAL_TABLET | Freq: Two times a day (BID) | ORAL | 2 refills | Status: DC

## 2015-11-25 NOTE — Telephone Encounter (Signed)
Also, please remind him that he should NOT be taking both etodolac and meloxicam; that can harm him kidneys; I am removing meloxicam off the med list since that is the oldest of that class of drugs Please review meds with him I'm stopping the omeprazole and replacing it with ranitidine for heartburn

## 2015-11-25 NOTE — Telephone Encounter (Signed)
Thank you I simply cannot continue to prescribe this medicine then Have him STOP lisinopril and we'll use a different medicine that doesn't require renal monitoring Sending new Rx to his pharmacy if he can get someone to pick that up or have them deliver We'll see him in November

## 2015-11-25 NOTE — Telephone Encounter (Signed)
Patient notified

## 2015-12-17 ENCOUNTER — Ambulatory Visit: Payer: Self-pay | Admitting: Family Medicine

## 2015-12-30 ENCOUNTER — Other Ambulatory Visit: Payer: Self-pay | Admitting: Family Medicine

## 2015-12-30 MED ORDER — AMLODIPINE BESYLATE 5 MG PO TABS: 5.0000 mg | ORAL_TABLET | Freq: Every day | ORAL | 0 refills | Status: DC

## 2015-12-30 NOTE — Telephone Encounter (Signed)
PT HAD APPT AND DR CALLED OUT [facility] . HE HAS SCHEDULED ANOTHER ONE BUT IS OUT OF ALL HIS MEDS. PLEASE REFILL . PHARM IS [facility].

## 2015-12-30 NOTE — Telephone Encounter (Signed)
Patient has outstanding labs; was supposed to be seen in late July I'll approve just one more refill, but we really need to see him and follow-up on his labs

## 2016-01-05 ENCOUNTER — Ambulatory Visit (INDEPENDENT_AMBULATORY_CARE_PROVIDER_SITE_OTHER): Payer: Medicaid Other | Admitting: Family Medicine

## 2016-01-05 ENCOUNTER — Telehealth: Payer: Self-pay | Admitting: Family Medicine

## 2016-01-05 ENCOUNTER — Encounter: Payer: Self-pay | Admitting: Family Medicine

## 2016-01-05 VITALS — BP 122/84 | HR 98 | Temp 98.9°F | Resp 16 | Wt 250.0 lb

## 2016-01-05 DIAGNOSIS — Z114 Encounter for screening for human immunodeficiency virus [HIV]: Secondary | ICD-10-CM

## 2016-01-05 DIAGNOSIS — M25562 Pain in left knee: Secondary | ICD-10-CM

## 2016-01-05 DIAGNOSIS — E7800 Pure hypercholesterolemia, unspecified: Secondary | ICD-10-CM | POA: Diagnosis not present

## 2016-01-05 DIAGNOSIS — R635 Abnormal weight gain: Secondary | ICD-10-CM

## 2016-01-05 DIAGNOSIS — J449 Chronic obstructive pulmonary disease, unspecified: Secondary | ICD-10-CM

## 2016-01-05 DIAGNOSIS — I10 Essential (primary) hypertension: Secondary | ICD-10-CM | POA: Diagnosis not present

## 2016-01-05 DIAGNOSIS — Z720 Tobacco use: Secondary | ICD-10-CM

## 2016-01-05 DIAGNOSIS — R748 Abnormal levels of other serum enzymes: Secondary | ICD-10-CM | POA: Diagnosis not present

## 2016-01-05 DIAGNOSIS — Z5181 Encounter for therapeutic drug level monitoring: Secondary | ICD-10-CM

## 2016-01-05 DIAGNOSIS — Z1211 Encounter for screening for malignant neoplasm of colon: Secondary | ICD-10-CM | POA: Insufficient documentation

## 2016-01-05 DIAGNOSIS — Z23 Encounter for immunization: Secondary | ICD-10-CM

## 2016-01-05 DIAGNOSIS — G8929 Other chronic pain: Secondary | ICD-10-CM

## 2016-01-05 DIAGNOSIS — E876 Hypokalemia: Secondary | ICD-10-CM | POA: Diagnosis not present

## 2016-01-05 DIAGNOSIS — M48061 Spinal stenosis, lumbar region without neurogenic claudication: Secondary | ICD-10-CM

## 2016-01-05 LAB — CBC WITH DIFFERENTIAL/PLATELET
BASOS ABS: 0 {cells}/uL (ref 0–200)
Basophils Relative: 0 %
EOS ABS: 80 {cells}/uL (ref 15–500)
EOS PCT: 1 %
HCT: 45.8 % (ref 38.5–50.0)
Hemoglobin: 15.6 g/dL (ref 13.2–17.1)
LYMPHS PCT: 29 %
Lymphs Abs: 2320 cells/uL (ref 850–3900)
MCH: 31.3 pg (ref 27.0–33.0)
MCHC: 34.1 g/dL (ref 32.0–36.0)
MCV: 92 fL (ref 80.0–100.0)
MONOS PCT: 5 %
MPV: 10.7 fL (ref 7.5–12.5)
Monocytes Absolute: 400 cells/uL (ref 200–950)
Neutro Abs: 5200 cells/uL (ref 1500–7800)
Neutrophils Relative %: 65 %
PLATELETS: 198 10*3/uL (ref 140–400)
RBC: 4.98 MIL/uL (ref 4.20–5.80)
RDW: 13.5 % (ref 11.0–15.0)
WBC: 8 10*3/uL (ref 3.8–10.8)

## 2016-01-05 LAB — COMPLETE METABOLIC PANEL WITH GFR
ALBUMIN: 4.4 g/dL (ref 3.6–5.1)
ALK PHOS: 52 U/L (ref 40–115)
ALT: 13 U/L (ref 9–46)
AST: 16 U/L (ref 10–40)
BILIRUBIN TOTAL: 0.3 mg/dL (ref 0.2–1.2)
BUN: 9 mg/dL (ref 7–25)
CALCIUM: 9.6 mg/dL (ref 8.6–10.3)
CO2: 23 mmol/L (ref 20–31)
CREATININE: 0.96 mg/dL (ref 0.60–1.35)
Chloride: 105 mmol/L (ref 98–110)
GFR, Est Non African American: >89 mL/min (ref >=60)
Glucose, Bld: 78 mg/dL (ref 65–99)
Potassium: 3.9 mmol/L (ref 3.5–5.3)
Sodium: 139 mmol/L (ref 135–146)
TOTAL PROTEIN: 7.3 g/dL (ref 6.1–8.1)

## 2016-01-05 LAB — LIPID PANEL
CHOL/HDL RATIO: 6.3 ratio — AB (ref <5.0)
CHOLESTEROL: 228 mg/dL — AB (ref <200)
HDL: 36 mg/dL — ABNORMAL LOW (ref >40)
LDL CALC: 142 mg/dL — AB (ref <100)
TRIGLYCERIDES: 250 mg/dL — AB (ref <150)
VLDL: 50 mg/dL — AB (ref <30)

## 2016-01-05 LAB — MAGNESIUM: MAGNESIUM: 1.9 mg/dL (ref 1.5–2.5)

## 2016-01-05 LAB — TSH: TSH: 9.6 mIU/L — ABNORMAL HIGH (ref 0.40–4.50)

## 2016-01-05 LAB — HIV ANTIBODY (ROUTINE TESTING W REFLEX): HIV: NONREACTIVE

## 2016-01-05 MED ORDER — BECLOMETHASONE DIPROPIONATE 40 MCG/ACT IN AERS: 2.0000 | INHALATION_SPRAY | Freq: Two times a day (BID) | RESPIRATORY_TRACT | 11 refills | Status: DC

## 2016-01-05 MED ORDER — ALBUTEROL SULFATE HFA 108 (90 BASE) MCG/ACT IN AERS: 1.0000 | INHALATION_SPRAY | RESPIRATORY_TRACT | 1 refills | Status: DC | PRN

## 2016-01-05 MED ORDER — OMEPRAZOLE 20 MG PO CPDR: 20.0000 mg | DELAYED_RELEASE_CAPSULE | Freq: Every day | ORAL | 3 refills | Status: DC

## 2016-01-05 NOTE — Patient Instructions (Signed)
We'll get labs today Stop ranitidine and start omeprazole for heartburn Caution: prolonged use of proton pump inhibitors like omeprazole (Prilosec), pantoprazole (Protonix), esomeprazole (Nexium), and others like Dexilant and Aciphex may increase your risk of pneumonia, Clostridium difficile colitis, osteoporosis, anemia and other health complications Try to limit or avoid triggers like coffee, caffeinated beverages, onions, chocolate, spicy foods, peppermint, acid foods like pizza, spaghetti sauce, and orange juice Lose weight if you are overweight or obese Try elevating the head of your bed by placing a small wedge between your mattress and box springs to keep acid in the stomach at night instead of coming up into your esophagus I do encourage you to quit smoking Call [phone] to sign up for smoking cessation classes You can call 1-800-QUIT-NOW to talk with a smoking cessation coach

## 2016-01-05 NOTE — Assessment & Plan Note (Signed)
Now on meloxicam; f/u with pain management and ortho

## 2016-01-05 NOTE — Assessment & Plan Note (Signed)
Appears euvolemic; check TSH

## 2016-01-05 NOTE — Assessment & Plan Note (Signed)
Going to see pain management soon

## 2016-01-05 NOTE — Progress Notes (Signed)
BP 122/84   Pulse 98   Temp 98.9 F (37.2 C) (Oral)   Resp 16   Wt 250 lb (113.4 kg)   SpO2 95%   BMI 32.98 kg/m    Subjective:    Patient ID: Scott Howell, male    DOB: 05-01-1970, 45 y.o.   MRN: [id number]  HPI: Scott Howell is a 45 y.o. male  Chief Complaint  Patient presents with  . Follow-up   Patient is here for f/u and labs  Elevated liver enzymes; he never got the labs done that were ordered in July; he is willing to get them done today; no abdominal pain; no jaundice; appetite is pretty good; sober for 15 years  HTN; well-controlled today; low K+ at one point; never rechecked in the summer, will get labs today; has palpitations at times; also having cramps in the legs  [facility]  Acid reflux; medicine is not working; used to take PPI but we switched it to the ranitidine; spitting up acid when laying; not eating right before bed  Used be on cholesterol medicine; stopped in the last six months (perhaps due to elevated liver enzymes?)  COPD; he needs refills of his inhalers; QVAR and SABA twice a day; he continues to smoke; smoking 3/4 ppd; used to smoke 2 ppd; not doing other stuff  Flu shot today  He has had three ER visits for his left knee (April, June, August); now seeing ortho and going to pain clinic; also has back issues  Depression screen [facility] 2[facility] 01/05/2016 01/05/2016 08/20/2015 03/25/2015 12/18/2014  Decreased Interest 1 2 0 0 0  Down, Depressed, Hopeless 3 2 0 0 0  PHQ - 2 Score 4 4 0 0 0  Altered sleeping 1 - - - -  Tired, decreased energy 1 - - - -  Change in appetite 1 - - - -  Feeling bad or failure about yourself  1 - - - -  Trouble concentrating 1 - - - -  Moving slowly or fidgety/restless 1 - - - -  Suicidal thoughts 0 - - - -  PHQ-9 Score 10 - - - -  Difficult doing work/chores Somewhat difficult - - - -   Relevant past medical, surgical, family and social history reviewed Past Medical History:    Diagnosis Date  . Bipolar 1 disorder (HCC)   . COPD (chronic obstructive pulmonary disease) (HCC)   . GERD (gastroesophageal reflux disease)   . High cholesterol   . Hypertension   . Schizophrenia (HCC)   . Stroke [facility](HCC)    Past Surgical History:  Procedure Laterality Date  . CERVICAL SPINE SURGERY     4  . HARDWARE REMOVAL Left 04/20/2015   Procedure: HARDWARE REMOVAL left leg;  Surgeon: Deeann SaintHoward Miller, MD;  Location: [facility] ORS;  Service: Orthopedics;  Laterality: Left;  . KNEE SURGERY Left   . SHOULDER SURGERY Left    Family History  Problem Relation Age of Onset  . Diabetes Mother   . Hyperlipidemia Mother   . Hypertension Mother   . Diabetes Maternal Aunt   . Cancer Maternal Aunt   . Hyperlipidemia Maternal Aunt   . Hypertension Maternal Aunt   . Diabetes Maternal Uncle   . Cancer Maternal Uncle   . Hyperlipidemia Maternal Uncle   . Hypertension Maternal Uncle    Social History  Substance Use Topics  . Smoking status: Current Every Day Smoker  Types: Cigarettes  . Smokeless tobacco: Never Used  . Alcohol use No     Comment: Sober for 12 yrs.  MD note: patient says he has been sober for 15 years  Interim medical history since last visit reviewed. Allergies and medications reviewed  Review of Systems Per HPI unless specifically indicated above     Objective:    BP 122/84   Pulse 98   Temp 98.9 F (37.2 C) (Oral)   Resp 16   Wt 250 lb (113.4 kg)   SpO2 95%   BMI 32.98 kg/m   Wt Readings from Last 3 Encounters:  01/05/16 250 lb (113.4 kg)  10/11/15 230 lb (104.3 kg)  08/20/15 235 lb 11.2 oz (106.9 kg)    Physical Exam  Constitutional: He appears well-developed and well-nourished. No distress.  HENT:  Head: Normocephalic and atraumatic.  Eyes: EOM are normal. No scleral icterus.  Neck: No thyromegaly present.  Cardiovascular: Normal rate and regular rhythm.   Pulmonary/Chest: Effort normal and breath sounds normal.  Abdominal: Soft. Bowel sounds  are normal. He exhibits no distension.  Musculoskeletal: He exhibits no edema.  Neurological: Coordination normal.  Skin: Skin is warm and dry. No pallor.  Psychiatric: He has a normal mood and affect. His behavior is normal. Judgment and thought content normal.   Results for orders placed or performed during the hospital encounter of 04/20/15  CBC  Result Value Ref Range   WBC 13.2 (H) 3.8 - 10.6 K/uL   RBC 4.21 (L) 4.40 - 5.90 MIL/uL   Hemoglobin 12.9 (L) 13.0 - 18.0 g/dL   HCT 62.137.7 (L) 30.840.0 - 65.752.0 %   MCV 89.6 80.0 - 100.0 fL   MCH 30.6 26.0 - 34.0 pg   MCHC 34.2 32.0 - 36.0 g/dL   RDW 84.614.0 96.211.5 - 95.214.5 %   Platelets 229 150 - 440 K/uL  Creatinine, serum  Result Value Ref Range   Creatinine, Ser 0.91 0.61 - 1.24 mg/dL   GFR calc non Af Amer >60 >60 mL/min   GFR calc Af Amer >60 >60 mL/min      Assessment & Plan:   Problem List Items Addressed This Visit      Cardiovascular and Mediastinum   Hypertension goal BP (blood pressure) < 140/90    Stable and controlled; limit processed foods and salt        Respiratory   Chronic obstructive pulmonary disease (HCC)   Relevant Medications   beclomethasone (QVAR) 40 MCG/ACT inhaler   albuterol (PROVENTIL HFA;VENTOLIN HFA) 108 (90 Base) MCG/ACT inhaler     Other   Medication monitoring encounter    Monitor labs      Relevant Orders   CBC with Differential/Platelet   COMPLETE METABOLIC PANEL WITH GFR   Lumbar canal stenosis    Going to see pain management soon      Hypokalemia    Check labs today      Relevant Orders   Magnesium   Hypercholesterolemia    Check cholesterol; fasting      Relevant Orders   Lipid panel   Encounter for screening for HIV    Screening test ordered      Relevant Orders   HIV antibody   Elevated liver enzymes - Primary    Check labs today; avoid tylenol and alcohol      Relevant Orders   Hepatitis panel, acute   COMPLETE METABOLIC PANEL WITH GFR   Current tobacco use     Urged patient  to quit; see AVS      Chronic knee pain    Now on meloxicam; f/u with pain management and ortho      Abnormal weight gain    Appears euvolemic; check TSH      Relevant Orders   TSH    Other Visit Diagnoses    Needs flu shot       Relevant Orders   Flu Vaccine QUAD 36+ mos PF IM (Fluarix & Fluzone Quad PF) (Completed)       Follow up plan: Return in about 3 months (around 04/06/2016) for followp-up.  An after-visit summary was printed and given to the patient at check-out.  Please see the patient instructions which may contain other information and recommendations beyond what is mentioned above in the assessment and plan.  Meds ordered this encounter  Medications  . meloxicam (MOBIC) 15 MG tablet    Sig: Take 15 mg by mouth daily.  . beclomethasone (QVAR) 40 MCG/ACT inhaler    Sig: Inhale 2 puffs into the lungs 2 (two) times daily.    Dispense:  1 Inhaler    Refill:  11  . albuterol (PROVENTIL HFA;VENTOLIN HFA) 108 (90 Base) MCG/ACT inhaler    Sig: Inhale 1-2 puffs into the lungs every 4 (four) hours as needed for wheezing or shortness of breath.    Dispense:  18 g    Refill:  1  . omeprazole (PRILOSEC) 20 MG capsule    Sig: Take 1 capsule (20 mg total) by mouth daily.    Dispense:  30 capsule    Refill:  3    Orders Placed This Encounter  Procedures  . Flu Vaccine QUAD 36+ mos PF IM (Fluarix & Fluzone Quad PF)  . Lipid panel  . CBC with Differential/Platelet  . Hepatitis panel, acute  . COMPLETE METABOLIC PANEL WITH GFR  . Magnesium  . HIV antibody  . TSH

## 2016-01-05 NOTE — Assessment & Plan Note (Signed)
Check cholesterol; fasting

## 2016-01-05 NOTE — Assessment & Plan Note (Signed)
Screening test ordered.

## 2016-01-05 NOTE — Assessment & Plan Note (Signed)
Stable and controlled; limit processed foods and salt

## 2016-01-05 NOTE — Telephone Encounter (Signed)
Patient was seen this morning. He states that you called in all his medication except his blood pressure medication. He does not remember the name of if. Please send to [facility]

## 2016-01-05 NOTE — Assessment & Plan Note (Signed)
Check labs today.

## 2016-01-05 NOTE — Assessment & Plan Note (Signed)
Check labs today; avoid tylenol and alcohol

## 2016-01-05 NOTE — Assessment & Plan Note (Signed)
Monitor labs 

## 2016-01-05 NOTE — Assessment & Plan Note (Signed)
Urged patient to quit; see AVS

## 2016-01-06 ENCOUNTER — Other Ambulatory Visit: Payer: Self-pay | Admitting: Family Medicine

## 2016-01-06 LAB — HEPATITIS PANEL, ACUTE
HCV Ab: NEGATIVE
HEP B S AG: NEGATIVE
Hep A IgM: NONREACTIVE
Hep B C IgM: NONREACTIVE

## 2016-01-06 MED ORDER — POTASSIUM CHLORIDE ER 10 MEQ PO TBCR: EXTENDED_RELEASE_TABLET | ORAL | 0 refills | Status: DC

## 2016-01-06 MED ORDER — LEVOTHYROXINE SODIUM 50 MCG PO TABS: 50.0000 ug | ORAL_TABLET | Freq: Every day | ORAL | 1 refills | Status: DC

## 2016-01-06 NOTE — Progress Notes (Signed)
Start thyroid med, some K+ replacement sent; appt requested to discuss several other abnormal labs (H/H, liver enzyme, glucose)

## 2016-01-28 ENCOUNTER — Other Ambulatory Visit: Payer: Self-pay | Admitting: Family Medicine

## 2016-03-10 ENCOUNTER — Other Ambulatory Visit: Payer: Self-pay | Admitting: Family Medicine

## 2016-03-14 NOTE — Telephone Encounter (Signed)
Please see lab results note from 01/06/16; I needed to see the patient a few weeks later; he needs an appt, first available please Thank you

## 2016-03-15 NOTE — Telephone Encounter (Signed)
LMOM to inform pt to call our office to schedule

## 2016-04-06 ENCOUNTER — Ambulatory Visit (INDEPENDENT_AMBULATORY_CARE_PROVIDER_SITE_OTHER): Payer: Medicaid Other | Admitting: Family Medicine

## 2016-04-06 ENCOUNTER — Encounter: Payer: Self-pay | Admitting: Family Medicine

## 2016-04-06 VITALS — BP 124/82 | HR 95 | Temp 99.0°F | Resp 16 | Wt 252.3 lb

## 2016-04-06 DIAGNOSIS — E7800 Pure hypercholesterolemia, unspecified: Secondary | ICD-10-CM

## 2016-04-06 DIAGNOSIS — J449 Chronic obstructive pulmonary disease, unspecified: Secondary | ICD-10-CM

## 2016-04-06 DIAGNOSIS — I10 Essential (primary) hypertension: Secondary | ICD-10-CM

## 2016-04-06 DIAGNOSIS — R946 Abnormal results of thyroid function studies: Secondary | ICD-10-CM | POA: Diagnosis not present

## 2016-04-06 DIAGNOSIS — J111 Influenza due to unidentified influenza virus with other respiratory manifestations: Secondary | ICD-10-CM

## 2016-04-06 DIAGNOSIS — R69 Illness, unspecified: Secondary | ICD-10-CM | POA: Diagnosis not present

## 2016-04-06 DIAGNOSIS — F3160 Bipolar disorder, current episode mixed, unspecified: Secondary | ICD-10-CM

## 2016-04-06 DIAGNOSIS — J029 Acute pharyngitis, unspecified: Secondary | ICD-10-CM

## 2016-04-06 DIAGNOSIS — Z79899 Other long term (current) drug therapy: Secondary | ICD-10-CM | POA: Diagnosis not present

## 2016-04-06 DIAGNOSIS — R7989 Other specified abnormal findings of blood chemistry: Secondary | ICD-10-CM

## 2016-04-06 DIAGNOSIS — Z5181 Encounter for therapeutic drug level monitoring: Secondary | ICD-10-CM

## 2016-04-06 LAB — POCT RAPID STREP A (OFFICE): RAPID STREP A SCREEN: NEGATIVE

## 2016-04-06 MED ORDER — ATORVASTATIN CALCIUM 20 MG PO TABS: 20.0000 mg | ORAL_TABLET | Freq: Every day | ORAL | 1 refills | Status: DC

## 2016-04-06 NOTE — Assessment & Plan Note (Signed)
Start back on medicine and recheck thyroid test in 6-8 weeks

## 2016-04-06 NOTE — Progress Notes (Signed)
BP 124/82   Pulse 95   Temp 99 F (37.2 C) (Oral)   Resp 16   Wt 252 lb 5 oz (114.4 kg)   SpO2 96%   BMI 33.29 kg/m    Subjective:    Patient ID: Scott Howell, male    DOB: 27-Feb-1970, 46 y.o.   MRN: [id number]  HPI: Scott Howell is a 46 y.o. male  Chief Complaint  Patient presents with  . Influenza    sereve sore thorat, hot/ cold, coughing, congestion and headaches started for 5 days  . Follow-up    3 mnth    He got sick on Wednesday with coughing and very sore throat; coughing up stuff, blowing stuff out of his nose; feeling hot and then cold and sweating; feeling hot; having body aches "from head to toe"; no rash; no travel; has not really tried anything OTC  He got in to see the pain clinic doctor last week; they want to get him off of the diazepam; they have to get in touch with psychiatrist; they don't want to put him on pain medicine plus valium  Hypothyroidism; last TSH was high, but just not able to pick up prescription  Obesity; has lost a lot of weight from where he was before, over 300 pounds  He has high cholesterol; thinks he was taking cholesterol medicine but it turns out that was an old prescription from 2015; not taking any now  Psychiatrist is prescribing his seroquel and diazepam and cymbalta  He has COPD and is on Qvar and has SABA if needed  Depression screen [facility] 2/9 04/06/2016 01/05/2016 01/05/2016 08/20/2015 03/25/2015  Decreased Interest - 1 2 0 0  Down, Depressed, Hopeless 1 3 2  0 0  PHQ - 2 Score 1 4 4  0 0  Altered sleeping - 1 - - -  Tired, decreased energy - 1 - - -  Change in appetite - 1 - - -  Feeling bad or failure about yourself  - 1 - - -  Trouble concentrating - 1 - - -  Moving slowly or fidgety/restless - 1 - - -  Suicidal thoughts - 0 - - -  PHQ-9 Score - 10 - - -  Difficult doing work/chores - Somewhat difficult - - -   Relevant past medical, surgical, family and social history reviewed Past Medical History:  Diagnosis  Date  . Bipolar 1 disorder (HCC)   . COPD (chronic obstructive pulmonary disease) (HCC)   . GERD (gastroesophageal reflux disease)   . High cholesterol   . Hypertension   . Schizophrenia (HCC)   . Stroke [facility])    Past Surgical History:  Procedure Laterality Date  . CERVICAL SPINE SURGERY     4  . HARDWARE REMOVAL Left 04/20/2015   Procedure: HARDWARE REMOVAL left leg;  Surgeon: Deeann Saint, MD;  Location: [facility] ORS;  Service: Orthopedics;  Laterality: Left;  . KNEE SURGERY Left   . SHOULDER SURGERY Left    Family History  Problem Relation Age of Onset  . Diabetes Mother   . Hyperlipidemia Mother   . Hypertension Mother   . Diabetes Maternal Aunt   . Cancer Maternal Aunt   . Hyperlipidemia Maternal Aunt   . Hypertension Maternal Aunt   . Diabetes Maternal Uncle   . Cancer Maternal Uncle   . Hyperlipidemia Maternal Uncle   . Hypertension Maternal Uncle    Social History  Substance Use Topics  . Smoking status: Current Every Day  Smoker    Types: Cigarettes  . Smokeless tobacco: Never Used  . Alcohol use No     Comment: Sober for 12 yrs.   Interim medical history since last visit reviewed. Allergies and medications reviewed  Review of Systems Per HPI unless specifically indicated above     Objective:    BP 124/82   Pulse 95   Temp 99 F (37.2 C) (Oral)   Resp 16   Wt 252 lb 5 oz (114.4 kg)   SpO2 96%   BMI 33.29 kg/m   Wt Readings from Last 3 Encounters:  04/06/16 252 lb 5 oz (114.4 kg)  01/05/16 250 lb (113.4 kg)  10/11/15 230 lb (104.3 kg)    Physical Exam  Constitutional: He appears well-developed and well-nourished. No distress.  HENT:  Head: Normocephalic and atraumatic.  Right Ear: Hearing and external ear normal.  Left Ear: Hearing and external ear normal.  Nose: Rhinorrhea (clear) present.  Mouth/Throat: Mucous membranes are normal. Posterior oropharyngeal erythema (mildly injected posteriorly) present. No oropharyngeal exudate or posterior  oropharyngeal edema.  Eyes: EOM are normal. Right conjunctiva is injected. Left conjunctiva is injected. No scleral icterus.  Neck: No thyromegaly present.  Cardiovascular: Normal rate and regular rhythm.   Pulmonary/Chest: Effort normal and breath sounds normal.  Abdominal: Soft. Bowel sounds are normal. He exhibits no distension.  Musculoskeletal: He exhibits no edema.  Lymphadenopathy:    He has cervical adenopathy (shoddy ac lad).  Neurological: Coordination normal.  Skin: Skin is warm and dry. He is not diaphoretic. No pallor.  Psychiatric: He has a normal mood and affect. His behavior is normal. Judgment and thought content normal.  Good eye contact with examiner      Assessment & Plan:   Problem List Items Addressed This Visit      Cardiovascular and Mediastinum   Hypertension goal BP (blood pressure) < 140/90    Controlled, continue CCB, work on weight loss      Relevant Medications   atorvastatin (LIPITOR) 20 MG tablet     Respiratory   Chronic obstructive pulmonary disease (HCC)    stable        Other   Medication monitoring encounter    Recheck labs with others around April 11th, follow SGPT on statin, CBC and Cr on meloxicam, glucose and lipids on Seroquel      Relevant Orders   CBC with Differential/Platelet   COMPLETE METABOLIC PANEL WITH GFR   Hypercholesterolemia    Will start back on statin, then recheck the cholesterol and SGPT in 6 weeks; limit saturated fats      Relevant Medications   atorvastatin (LIPITOR) 20 MG tablet   Other Relevant Orders   Lipid panel   Chronic prescription benzodiazepine use    Prescribed by psychiatrist; pain clinic wants him off of benzo before they'll prescribe pain medicine; will let pain clinic and psychiatrist work together on this      Bipolar 1 disorder, mixed (HCC)    Followed by psychiatrist      Abnormal thyroid blood test    Start back on medicine and recheck thyroid test in 6-8 weeks      Relevant  Orders   TSH    Other Visit Diagnoses    Influenza-like illness    -  Primary   suspect flu; no test kits here; past 48 hour window for Tamiflu; will treat symptomatically; cautioned about dehydration, pneumonia, reasons to go to ER   Sore throat  Relevant Orders   POCT rapid strep A (Completed)   Culture, Group A Strep (Completed)      Follow up plan: Return in about 6 months (around 10/04/2016) for fasting labs and visit with Dr. Sherie Don.  An after-visit summary was printed and given to the patient at check-out.  Please see the patient instructions which may contain other information and recommendations beyond what is mentioned above in the assessment and plan.  Meds ordered this encounter  Medications  . DISCONTD: atorvastatin (LIPITOR) 20 MG tablet    Sig: Take 1 tablet by mouth at bedtime.  Marland Kitchen atorvastatin (LIPITOR) 20 MG tablet    Sig: Take 1 tablet (20 mg total) by mouth at bedtime.    Dispense:  30 tablet    Refill:  1    Orders Placed This Encounter  Procedures  . Culture, Group A Strep  . Lipid panel  . CBC with Differential/Platelet  . COMPLETE METABOLIC PANEL WITH GFR  . TSH  . POCT rapid strep A

## 2016-04-06 NOTE — Patient Instructions (Addendum)
Recheck fasting labs here in 8 weeks, around April 11th Try vitamin C (orange juice if not diabetic or vitamin C tablets) and drink green tea to help your immune system during your illness Get plenty of rest and hydration  I do encourage you to quit smoking Call [phone] to sign up for smoking cessation classes You can call 1-800-QUIT-NOW to talk with a smoking cessation coach  Try to limit saturated fats in your diet (bologna, hot dogs, barbeque, cheeseburgers, hamburgers, steak, bacon, sausage, cheese, etc.) and get more fresh fruits, vegetables, and whole grains

## 2016-04-06 NOTE — Assessment & Plan Note (Signed)
Will start back on statin, then recheck the cholesterol and SGPT in 6 weeks; limit saturated fats

## 2016-04-09 LAB — CULTURE, GROUP A STREP

## 2016-04-10 NOTE — Assessment & Plan Note (Signed)
Prescribed by psychiatrist; pain clinic wants him off of benzo before they'll prescribe pain medicine; will let pain clinic and psychiatrist work together on this

## 2016-04-10 NOTE — Assessment & Plan Note (Signed)
Recheck labs with others around April 11th, follow SGPT on statin, CBC and Cr on meloxicam, glucose and lipids on Seroquel

## 2016-04-10 NOTE — Assessment & Plan Note (Signed)
Followed by psychiatrist.  

## 2016-04-10 NOTE — Assessment & Plan Note (Signed)
Controlled, continue CCB, work on weight loss

## 2016-04-10 NOTE — Assessment & Plan Note (Signed)
stable °

## 2016-04-12 ENCOUNTER — Other Ambulatory Visit: Payer: Self-pay | Admitting: Family Medicine

## 2016-05-13 ENCOUNTER — Other Ambulatory Visit: Payer: Self-pay | Admitting: Family Medicine

## 2016-05-13 NOTE — Telephone Encounter (Signed)
Labs due around 05/25/16; refills sent of thyroid and h2 blocker; stopping PPI, LFTs pending

## 2016-05-17 ENCOUNTER — Other Ambulatory Visit: Payer: Self-pay | Admitting: Family Medicine

## 2016-05-17 MED ORDER — FLUTICASONE PROPIONATE HFA 110 MCG/ACT IN AERO: 2.0000 | INHALATION_SPRAY | Freq: Two times a day (BID) | RESPIRATORY_TRACT | 12 refills | Status: DC

## 2016-05-17 NOTE — Progress Notes (Unsigned)
Please let patient know that his insurance won't cover QVAR; we have to switch to Flovent; thank you

## 2016-05-18 ENCOUNTER — Telehealth: Payer: Self-pay

## 2016-05-18 NOTE — Telephone Encounter (Signed)
Pt.notified

## 2016-06-14 ENCOUNTER — Other Ambulatory Visit: Payer: Self-pay | Admitting: Family Medicine

## 2016-06-14 ENCOUNTER — Other Ambulatory Visit: Payer: Self-pay

## 2016-06-14 DIAGNOSIS — E7800 Pure hypercholesterolemia, unspecified: Secondary | ICD-10-CM

## 2016-06-14 DIAGNOSIS — R7989 Other specified abnormal findings of blood chemistry: Secondary | ICD-10-CM

## 2016-06-14 DIAGNOSIS — Z5181 Encounter for therapeutic drug level monitoring: Secondary | ICD-10-CM

## 2016-06-14 NOTE — Telephone Encounter (Signed)
Patient notified

## 2016-06-14 NOTE — Telephone Encounter (Signed)
Labs are overdue Please ask patient to come by fasting for labs this week or early next; I'll send 7 days of meds

## 2016-07-13 ENCOUNTER — Telehealth: Payer: Self-pay | Admitting: Family Medicine

## 2016-07-13 NOTE — Telephone Encounter (Signed)
Left detail messaged 

## 2016-07-13 NOTE — Telephone Encounter (Signed)
Please remind patient to have labs done that were ordered May 1st; thank you

## 2016-07-20 ENCOUNTER — Other Ambulatory Visit: Payer: Self-pay | Admitting: Family Medicine

## 2016-07-20 ENCOUNTER — Telehealth: Payer: Self-pay | Admitting: Family Medicine

## 2016-07-20 DIAGNOSIS — J449 Chronic obstructive pulmonary disease, unspecified: Secondary | ICD-10-CM

## 2016-07-20 NOTE — Telephone Encounter (Signed)
Morrie SheldonAshley from [facility] has received the scripts for atorvastatin and levothyroxine. They are asking that you please send new script with a 30 day supply instead of a 7 day supply. Pt does have upcoming appt for 10/04/16

## 2016-07-20 NOTE — Telephone Encounter (Signed)
Please remind patient of overdue labs We want to make sure his medicines are the right doses and not causing any harm to his liver or kidneys I sent in a 7 day supply of the pills, and we'll send more when I see his lab results Thank you

## 2016-07-20 NOTE — Telephone Encounter (Signed)
No, patient is way overdue for labs See other phone note We gave a 7 day supply to allow him one week to get labs done He still hasn't gotten labs done I'm giving him another 7 day supply, one more chance He absolutely must get labs done please

## 2016-07-21 NOTE — Telephone Encounter (Signed)
Spoke with pt and he states that he didn't understand why she is only giving him days worth of medicine. He then explain to me he has some issue with his leg. I explain to pt that she order the labs beginning of may and she likes to make sure the medicine she is giving you is working and it  there is change that need to be done she can do that by looking at his labs. I explain to pt that we are just trying to make sure that he is healthy and what Dr. lada gives him is working properly. He understood and he agreed to come in either tomorrow morning since its a fasting lab or Monday morning

## 2016-09-15 ENCOUNTER — Telehealth: Payer: Self-pay | Admitting: Family Medicine

## 2016-09-15 ENCOUNTER — Other Ambulatory Visit: Payer: Self-pay | Admitting: Family Medicine

## 2016-09-15 DIAGNOSIS — J449 Chronic obstructive pulmonary disease, unspecified: Secondary | ICD-10-CM

## 2016-09-15 NOTE — Telephone Encounter (Signed)
Pt requesting refill on amlodipine please send to [facility]. Pt does have appt for 10/04/16

## 2016-09-15 NOTE — Telephone Encounter (Signed)
Left detailed voicemail to contact pharmacy personally because according to our file he should have enough refills to last until 2/19.  Call us back if any problems

## 2016-09-22 ENCOUNTER — Encounter: Payer: Self-pay | Admitting: Medical Oncology

## 2016-09-22 ENCOUNTER — Emergency Department
Admission: EM | Admit: 2016-09-22 | Discharge: 2016-09-22 | Disposition: A | Discharge status: Home / Self Care | Payer: Medicaid Other | Attending: Emergency Medicine | Admitting: Emergency Medicine

## 2016-09-22 DIAGNOSIS — W2209XA Striking against other stationary object, initial encounter: Secondary | ICD-10-CM | POA: Diagnosis not present

## 2016-09-22 DIAGNOSIS — Y929 Unspecified place or not applicable: Secondary | ICD-10-CM | POA: Insufficient documentation

## 2016-09-22 DIAGNOSIS — Y939 Activity, unspecified: Secondary | ICD-10-CM | POA: Diagnosis not present

## 2016-09-22 DIAGNOSIS — Z791 Long term (current) use of non-steroidal anti-inflammatories (NSAID): Secondary | ICD-10-CM | POA: Insufficient documentation

## 2016-09-22 DIAGNOSIS — S0990XA Unspecified injury of head, initial encounter: Secondary | ICD-10-CM | POA: Diagnosis present

## 2016-09-22 DIAGNOSIS — S0101XA Laceration without foreign body of scalp, initial encounter: Secondary | ICD-10-CM | POA: Diagnosis not present

## 2016-09-22 DIAGNOSIS — J449 Chronic obstructive pulmonary disease, unspecified: Secondary | ICD-10-CM | POA: Diagnosis not present

## 2016-09-22 DIAGNOSIS — I10 Essential (primary) hypertension: Secondary | ICD-10-CM | POA: Insufficient documentation

## 2016-09-22 DIAGNOSIS — Z79899 Other long term (current) drug therapy: Secondary | ICD-10-CM | POA: Diagnosis not present

## 2016-09-22 DIAGNOSIS — F17210 Nicotine dependence, cigarettes, uncomplicated: Secondary | ICD-10-CM | POA: Insufficient documentation

## 2016-09-22 DIAGNOSIS — Y999 Unspecified external cause status: Secondary | ICD-10-CM | POA: Diagnosis not present

## 2016-09-22 MED ORDER — TRAMADOL HCL 50 MG PO TABS: 50.0000 mg | ORAL_TABLET | Freq: Four times a day (QID) | ORAL | 0 refills | Status: AC | PRN

## 2016-09-22 MED ORDER — TRAMADOL HCL 50 MG PO TABS
50.0000 mg | ORAL_TABLET | Freq: Once | ORAL | Status: AC
  Administered 2016-09-22: 50 mg via ORAL
  Filled 2016-09-22: qty 1

## 2016-09-22 MED ORDER — LIDOCAINE HCL (PF) 1 % IJ SOLN
INTRAMUSCULAR | Status: AC
  Filled 2016-09-22: qty 5

## 2016-09-22 NOTE — ED Triage Notes (Addendum)
Pt reports that he "bounced off of the bed" this am and hit the back of his head on table. Pt denies LOC. Pt not on blood thinner. Pt states that he was "due to be at court this am" and is asking for something to be faxed to court. I have told the patient that we aren't permitted to do so.

## 2016-09-22 NOTE — ED Provider Notes (Signed)
[facility] Emergency Department Provider Note   ____________________________________________   First MD Initiated Contact with Patient 09/22/16 [phone]     (approximate)  I have reviewed the triage vital signs and the nursing notes.   HISTORY  Chief Complaint Fall    HPI Scott Howell is a 46 y.o. male patient presents with a scalp laceration secondary to 2 hitting his head on the edge of a speaker. Patient denies LOC. Patient state bleeding was controlled direct pressure. Patient denies vertigo or vision disturbance. Patient rates pain as 8/10. Patient describes pain as "achy".   Past Medical History:  Diagnosis Date  . Bipolar 1 disorder (HCC)   . COPD (chronic obstructive pulmonary disease) (HCC)   . GERD (gastroesophageal reflux disease)   . High cholesterol   . Hypertension   . Schizophrenia (HCC)   . Stroke [facility])     Patient Active Problem List   Diagnosis Date Noted  . Abnormal thyroid blood test 04/06/2016  . Encounter for screening for HIV 01/05/2016  . Abnormal weight gain 01/05/2016  . Chronic prescription benzodiazepine use 10/19/2015  . Elevated liver enzymes 08/20/2015  . Medication monitoring encounter 08/20/2015  . Hypokalemia 08/20/2015  . Erectile dysfunction 04/22/2015  . Status post hardware removal 04/20/2015  . Bipolar 2 disorder (HCC) 07/29/2014  . Marijuana abuse 07/29/2014  . Chronic knee pain 07/29/2014  . Bipolar 1 disorder, mixed (HCC)   . Drug abuse, opioid type 06/28/2013  . GERD without esophagitis 10/03/2012  . Hypertension goal BP (blood pressure) < 140/90 10/03/2012  . Chronic pain associated with significant psychosocial dysfunction 07/17/2012  . Lumbar canal stenosis 04/09/2012  . Hypercholesterolemia 01/06/2012  . CN (constipation) 11/11/2011  . 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 Decreased motor strength 09/30/2010  . Current tobacco use 09/30/2010  . H/O transient cerebral ischemia  09/09/2010  . LBP (low back pain) 08/30/2010  . Cervical spinal cord compression (HCC) 07/22/2010  . Cervical post-laminectomy syndrome 07/22/2010  . Chronic obstructive pulmonary disease (HCC) 02/23/2010    Past Surgical History:  Procedure Laterality Date  . CERVICAL SPINE SURGERY     4  . HARDWARE REMOVAL Left 04/20/2015   Procedure: HARDWARE REMOVAL left leg;  Surgeon: Deeann Saint, MD;  Location: [facility] ORS;  Service: Orthopedics;  Laterality: Left;  . KNEE SURGERY Left   . SHOULDER SURGERY Left     Prior to Admission medications   Medication Sig Start Date End Date Taking? Authorizing Provider  amLODipine (NORVASC) 5 MG tablet TAKE 1 TABLET BY MOUTH DAILY. 04/12/16   Kerman Passey, MD  aspirin 81 MG tablet Take 81 mg by mouth. 06/17/10   [provider]  atorvastatin (LIPITOR) 20 MG tablet TAKE 1 TABLET BY MOUTH AT BEDTIME. 07/20/16   Lada, Janit Bern, MD  diazepam (VALIUM) 10 MG tablet Take 5 mg by mouth 4 (four) times daily as needed.  07/11/13   [provider]  DULoxetine (CYMBALTA) 60 MG capsule Take 120 mg by mouth daily.     [provider]  fluticasone (FLOVENT HFA) 110 MCG/ACT inhaler Inhale 2 puffs into the lungs 2 (two) times daily. 05/17/16   Lada, Janit Bern, MD  levothyroxine (SYNTHROID, LEVOTHROID) 50 MCG tablet TAKE 1 TABLET BY MOUTH DAILY. 07/20/16   Kerman Passey, MD  meloxicam (MOBIC) 15 MG tablet Take 15 mg by mouth daily.    [provider]  omeprazole (PRILOSEC) 20 MG capsule Take 1 capsule (20 mg total)  by mouth daily. 01/05/16   Kerman PasseyLada, Melinda P, MD  potassium chloride (KLOR-CON 10) 10 MEQ tablet One by mouth twice a day for three days, then one a day until finished Patient not taking: Reported on 04/06/2016 01/06/16   Kerman PasseyLada, Melinda P, MD  PROVENTIL HFA 108 (90 Base) MCG/ACT inhaler INHALE 1-2 PUFFS INTO THE LUNGS EVERY FOUR HOURS AS NEEDED FOR WHEEZING OR SHORTNESS OF BREATH 09/15/16   Lada, Janit BernMelinda P, MD  QUEtiapine (SEROQUEL) 50 MG  tablet Take 100 mg by mouth 5 (five) times daily. Pt takes one tablet three times a day and two tablets at bedtime.    [provider]  ranitidine (ZANTAC) 150 MG tablet TAKE 1 TABLET BY MOUTH 2 TIMES DAILY FOR HEARTBURN ,REFLUX 09/15/16   Lada, Janit BernMelinda P, MD  traMADol (ULTRAM) 50 MG tablet Take 1 tablet (50 mg total) by mouth every 6 (six) hours as needed. 09/22/16 09/22/17  Joni ReiningSmith,  K, PA-C    Allergies Acetaminophen  Family History  Problem Relation Age of Onset  . Diabetes Mother   . Hyperlipidemia Mother   . Hypertension Mother   . Diabetes Maternal Aunt   . Cancer Maternal Aunt   . Hyperlipidemia Maternal Aunt   . Hypertension Maternal Aunt   . Diabetes Maternal Uncle   . Cancer Maternal Uncle   . Hyperlipidemia Maternal Uncle   . Hypertension Maternal Uncle     Social History Social History  Substance Use Topics  . Smoking status: Current Every Day Smoker    Types: Cigarettes  . Smokeless tobacco: Never Used  . Alcohol use No     Comment: Sober for 12 yrs.    Review of Systems  Constitutional: No fever/chills Eyes: No visual changes. ENT: No sore throat. Cardiovascular: Denies chest pain. Respiratory: Denies shortness of breath. Gastrointestinal: No abdominal pain.  No nausea, no vomiting.  No diarrhea.  No constipation. Genitourinary: Negative for dysuria. Musculoskeletal: Negative for back pain. Skin: Negative for rash. Neurological: Negative for headaches, focal weakness or numbness. Psychiatric:Bipolar schizophrenia. Endocrine:Hyperlipidemia hypertension. Allergic/Immunilogical: Tylenol.  ____________________________________________   PHYSICAL EXAM:  VITAL SIGNS: ED Triage Vitals [09/22/16 0922]  Enc Vitals Group     BP 135/85     Pulse Rate 100     Resp 18     Temp 98.9 F (37.2 C)     Temp Source Oral     SpO2 96 %     Weight 234 lb (106.1 kg)     Height 6' (1.829 m)     Head Circumference      Peak Flow      Pain Score 8      Pain Loc      Pain Edu?      Excl. in GC?     Constitutional: Alert and oriented. Well appearing and in no acute distress. Eyes: Conjunctivae are normal. PERRL. EOMI. Head: Atraumatic. Scalp laceration  Nose: No congestion/rhinnorhea. Mouth/Throat: Mucous membranes are moist.  Oropharynx non-erythematous. Neck: No stridor.   Cardiovascular: Normal rate, regular rhythm. Grossly normal heart sounds.  Good peripheral circulation. Respiratory: Normal respiratory effort.  No retractions. Lungs CTAB. Neurologic:  Normal speech and language. No gross focal neurologic deficits are appreciated. No gait instability. Skin:  Skin is warm, dry and intact. No rash noted. Psychiatric: Mood and affect are normal. Speech and behavior are normal.  ____________________________________________   LABS (all labs ordered are listed, but only abnormal results are displayed)  Labs Reviewed - No data to display  ____________________________________________  EKG   ____________________________________________  RADIOLOGY  No results found.  ____________________________________________   PROCEDURES  Procedure(s) performed: LACERATION REPAIR Performed by: Joni Reining Authorized by: Joni Reining Consent: Verbal consent obtained. Risks and benefits: risks, benefits and alternatives were discussed Consent given by: patient Patient identity confirmed: provided demographic data Prepped and Draped in normal sterile fashion Wound explored  Laceration Location: Scalp  Laceration Length: 2.5 cm  No Foreign Bodies seen or palpated  Anesthesia: local infiltration  Local anesthetic: lidocaine 1% without epinephrine  Anesthetic total: 5 ML Irrigation method: syringe Amount of cleaning: standard  Skin closure: Staples Number of sutures: 10  Patient tolerance: Patient tolerated the procedure well with no immediate complications.   Procedures  Critical Care performed:  No  ____________________________________________   INITIAL IMPRESSION / ASSESSMENT AND PLAN / ED COURSE  Pertinent labs & imaging results that were available during my care of the patient were reviewed by me and considered in my medical decision making (see chart for details).  Scalp contusion and laceration. Patient remained alert and orientated throughout the emergency room visit. Wound is closed with staples. Patient given discharge care instructions. Patient advised return back 10 days for staple removal.      ____________________________________________   FINAL CLINICAL IMPRESSION(S) / ED DIAGNOSES  Final diagnoses:  Laceration of scalp, initial encounter      NEW MEDICATIONS STARTED DURING THIS VISIT:  New Prescriptions   TRAMADOL (ULTRAM) 50 MG TABLET    Take 1 tablet (50 mg total) by mouth every 6 (six) hours as needed.     Note:  This document was prepared using Dragon voice recognition software and may include unintentional dictation errors.    Joni Reining, PA-C 09/22/16 1020    Phineas Semen, MD 09/22/16 [phone]

## 2016-09-22 NOTE — ED Notes (Signed)
Pt reports went to sit down on his bed, bounced off and hit his head on his speakers. Pt with approximately 1 inch laceration to back left side of head. Denies LOC or vommitting afterwards. Physician in room with RN.

## 2016-10-04 ENCOUNTER — Ambulatory Visit: Payer: Self-pay | Admitting: Family Medicine

## 2016-11-09 ENCOUNTER — Other Ambulatory Visit: Payer: Self-pay | Admitting: Family Medicine

## 2016-11-09 DIAGNOSIS — J449 Chronic obstructive pulmonary disease, unspecified: Secondary | ICD-10-CM

## 2016-11-09 NOTE — Telephone Encounter (Signed)
I reviewed the ER note from August 9th He was supposed to return to the ER 10 days later for staple removal He needs to go to the ER ASAP for staple removal I'm not going to be able to remove them if they've been in there that long Thank you for telling him to go, and please reaffirm that I agree and he needs to go back to the ER ASAP

## 2016-11-09 NOTE — Telephone Encounter (Signed)
Pt notified, states uses the rescue inhaler almost bid every day.  He has an appt on oct 5 to discuss also states he wants to get staples removed that he has had in for over a month, I told him he needed to get removed ASAP before this appt.

## 2016-11-09 NOTE — Telephone Encounter (Signed)
If patient has already been through one inhaler plus the refill since early August, he needs an appointment for uncontrolled respiratory status He can come here or go to urgent care, but that is way too much SABA (inhaler) use

## 2016-11-10 NOTE — Telephone Encounter (Signed)
Left detailed voicemail

## 2016-11-18 ENCOUNTER — Ambulatory Visit: Payer: Self-pay | Admitting: Family Medicine

## 2016-12-08 ENCOUNTER — Emergency Department: Payer: Medicaid Other

## 2016-12-08 ENCOUNTER — Emergency Department
Admission: EM | Admit: 2016-12-08 | Discharge: 2016-12-08 | Disposition: A | Discharge status: Home / Self Care | Payer: Medicaid Other | Attending: Emergency Medicine | Admitting: Emergency Medicine

## 2016-12-08 ENCOUNTER — Encounter: Payer: Self-pay | Admitting: Emergency Medicine

## 2016-12-08 DIAGNOSIS — Y929 Unspecified place or not applicable: Secondary | ICD-10-CM | POA: Insufficient documentation

## 2016-12-08 DIAGNOSIS — F209 Schizophrenia, unspecified: Secondary | ICD-10-CM | POA: Insufficient documentation

## 2016-12-08 DIAGNOSIS — S8002XA Contusion of left knee, initial encounter: Secondary | ICD-10-CM | POA: Insufficient documentation

## 2016-12-08 DIAGNOSIS — F319 Bipolar disorder, unspecified: Secondary | ICD-10-CM | POA: Insufficient documentation

## 2016-12-08 DIAGNOSIS — F17210 Nicotine dependence, cigarettes, uncomplicated: Secondary | ICD-10-CM | POA: Insufficient documentation

## 2016-12-08 DIAGNOSIS — F1210 Cannabis abuse, uncomplicated: Secondary | ICD-10-CM | POA: Diagnosis not present

## 2016-12-08 DIAGNOSIS — Z8673 Personal history of transient ischemic attack (TIA), and cerebral infarction without residual deficits: Secondary | ICD-10-CM | POA: Diagnosis not present

## 2016-12-08 DIAGNOSIS — Z7982 Long term (current) use of aspirin: Secondary | ICD-10-CM | POA: Diagnosis not present

## 2016-12-08 DIAGNOSIS — J441 Chronic obstructive pulmonary disease with (acute) exacerbation: Secondary | ICD-10-CM | POA: Insufficient documentation

## 2016-12-08 DIAGNOSIS — R0602 Shortness of breath: Secondary | ICD-10-CM | POA: Diagnosis present

## 2016-12-08 DIAGNOSIS — Z79899 Other long term (current) drug therapy: Secondary | ICD-10-CM | POA: Diagnosis not present

## 2016-12-08 DIAGNOSIS — I10 Essential (primary) hypertension: Secondary | ICD-10-CM | POA: Insufficient documentation

## 2016-12-08 DIAGNOSIS — Y9301 Activity, walking, marching and hiking: Secondary | ICD-10-CM | POA: Insufficient documentation

## 2016-12-08 DIAGNOSIS — W010XXA Fall on same level from slipping, tripping and stumbling without subsequent striking against object, initial encounter: Secondary | ICD-10-CM | POA: Diagnosis not present

## 2016-12-08 DIAGNOSIS — Y998 Other external cause status: Secondary | ICD-10-CM | POA: Insufficient documentation

## 2016-12-08 LAB — CBC WITH DIFFERENTIAL/PLATELET
BASOS ABS: 0 10*3/uL (ref 0–0.1)
Basophils Relative: 1 %
Eosinophils Absolute: 0.1 10*3/uL (ref 0–0.7)
Eosinophils Relative: 2 %
HEMATOCRIT: 40.4 % (ref 40.0–52.0)
HEMOGLOBIN: 13.9 g/dL (ref 13.0–18.0)
LYMPHS PCT: 25 %
Lymphs Abs: 1.8 10*3/uL (ref 1.0–3.6)
MCH: 30.6 pg (ref 26.0–34.0)
MCHC: 34.4 g/dL (ref 32.0–36.0)
MCV: 89.2 fL (ref 80.0–100.0)
Monocytes Absolute: 0.6 10*3/uL (ref 0.2–1.0)
Monocytes Relative: 8 %
NEUTROS ABS: 4.8 10*3/uL (ref 1.4–6.5)
Neutrophils Relative %: 64 %
Platelets: 237 10*3/uL (ref 150–440)
RBC: 4.53 MIL/uL (ref 4.40–5.90)
RDW: 14.4 % (ref 11.5–14.5)
WBC: 7.4 10*3/uL (ref 3.8–10.6)

## 2016-12-08 LAB — BASIC METABOLIC PANEL
ANION GAP: 11 (ref 5–15)
BUN: 10 mg/dL (ref 6–20)
CO2: 26 mmol/L (ref 22–32)
Calcium: 9.7 mg/dL (ref 8.9–10.3)
Chloride: 100 mmol/L — ABNORMAL LOW (ref 101–111)
Creatinine, Ser: 0.93 mg/dL (ref 0.61–1.24)
GFR calc Af Amer: >60 mL/min (ref >60)
GLUCOSE: 98 mg/dL (ref 65–99)
POTASSIUM: 4.2 mmol/L (ref 3.5–5.1)
Sodium: 137 mmol/L (ref 135–145)

## 2016-12-08 MED ORDER — METHYLPREDNISOLONE SODIUM SUCC 125 MG IJ SOLR
125.0000 mg | Freq: Once | INTRAMUSCULAR | Status: AC
  Administered 2016-12-08: 125 mg via INTRAVENOUS
  Filled 2016-12-08: qty 2

## 2016-12-08 MED ORDER — IPRATROPIUM-ALBUTEROL 0.5-2.5 (3) MG/3ML IN SOLN
9.0000 mL | Freq: Once | RESPIRATORY_TRACT | Status: AC
  Administered 2016-12-08: 9 mL via RESPIRATORY_TRACT
  Filled 2016-12-08: qty 9

## 2016-12-08 MED ORDER — PREDNISONE 50 MG PO TABS: ORAL_TABLET | ORAL | 0 refills | Status: DC

## 2016-12-08 MED ORDER — DOXYCYCLINE HYCLATE 100 MG PO CAPS: 100.0000 mg | ORAL_CAPSULE | Freq: Two times a day (BID) | ORAL | 0 refills | Status: DC

## 2016-12-08 MED ORDER — DOXYCYCLINE HYCLATE 100 MG PO TABS
100.0000 mg | ORAL_TABLET | Freq: Once | ORAL | Status: AC
  Administered 2016-12-08: 100 mg via ORAL
  Filled 2016-12-08: qty 1

## 2016-12-08 NOTE — ED Notes (Signed)

## 2016-12-08 NOTE — ED Triage Notes (Signed)
Patient presents to the ED with cough and shortness of breath.  Patient states he has been feeling short of breath, particularly when walking.  Patient states yesterday evening he felt light headed due to shortness of breath and fell.  Patient states he has chronic left knee pain and he fell on his left knee yesterday evening.  Patient denies passing out.  Patient reports he had staples placed to his scalp in August and needs them removed.  Patient denies hitting his head last night when he fell.

## 2016-12-08 NOTE — ED Provider Notes (Signed)
[facility] Emergency Department Provider Note  ____________________________________________   First MD Initiated Contact with Patient 12/08/16 0940     (approximate)  I have reviewed the triage vital signs and the nursing notes.   HISTORY  Chief Complaint Shortness of Breath; Knee Pain; and Suture / Staple Removal   HPI Scott Howell is a 46 y.o. male with a history of COPD who is smoking about 5 cigarettes per week who is presenting with runny nose, sore throat cough and shortness of breath over the past week. He says that he started with chills last week and has progressed to shortness of breath especially with ambulation. He says that once he ambulates several feet he starts to become dizzy and lightheaded and feels like he is going to pass out. He smokes about 5 cigarettes per week. Says that he has about 5 known sick contacts where he lives. Denies any chest pain.Patient says that he also fell this morning while walking. Fall was a mechanical fall. Says that he is now pain to his left medial knee. Says that he is advanced arthritis and has been told that he needs a joint replacement there.   Past Medical History:  Diagnosis Date  . Bipolar 1 disorder (HCC)   . COPD (chronic obstructive pulmonary disease) (HCC)   . GERD (gastroesophageal reflux disease)   . High cholesterol   . Hypertension   . Schizophrenia (HCC)   . Stroke [facility])     Patient Active Problem List   Diagnosis Date Noted  . Abnormal thyroid blood test 04/06/2016  . Encounter for screening for HIV 01/05/2016  . Abnormal weight gain 01/05/2016  . Chronic prescription benzodiazepine use 10/19/2015  . Elevated liver enzymes 08/20/2015  . Medication monitoring encounter 08/20/2015  . Hypokalemia 08/20/2015  . Erectile dysfunction 04/22/2015  . Status post hardware removal 04/20/2015  . Bipolar 2 disorder (HCC) 07/29/2014  . Marijuana abuse 07/29/2014  . Chronic knee pain  07/29/2014  . Bipolar 1 disorder, mixed (HCC)   . Drug abuse, opioid type (HCC) 06/28/2013  . GERD without esophagitis 10/03/2012  . Hypertension goal BP (blood pressure) < 140/90 10/03/2012  . Chronic pain associated with significant psychosocial dysfunction 07/17/2012  . Lumbar canal stenosis 04/09/2012  . Hypercholesterolemia 01/06/2012  . CN (constipation) 11/11/2011  . 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 Decreased motor strength 09/30/2010  . Current tobacco use 09/30/2010  . H/O transient cerebral ischemia 09/09/2010  . LBP (low back pain) 08/30/2010  . Cervical spinal cord compression (HCC) 07/22/2010  . Cervical post-laminectomy syndrome 07/22/2010  . Chronic obstructive pulmonary disease (HCC) 02/23/2010    Past Surgical History:  Procedure Laterality Date  . CERVICAL SPINE SURGERY     4  . HARDWARE REMOVAL Left 04/20/2015   Procedure: HARDWARE REMOVAL left leg;  Surgeon: Deeann Saint, MD;  Location: [facility] ORS;  Service: Orthopedics;  Laterality: Left;  . KNEE SURGERY Left   . SHOULDER SURGERY Left     Prior to Admission medications   Medication Sig Start Date End Date Taking? Authorizing Provider  amLODipine (NORVASC) 5 MG tablet TAKE 1 TABLET BY MOUTH DAILY. 04/12/16  Yes Lada, Janit Bern, MD  aspirin 81 MG tablet Take 81 mg by mouth. 06/17/10  Yes [provider]  DULoxetine (CYMBALTA) 60 MG capsule Take 60 mg by mouth 2 (two) times daily.    Yes [provider]  fluticasone (FLOVENT HFA) 110 MCG/ACT inhaler Inhale 2 puffs into the lungs  2 (two) times daily. 05/17/16  Yes Lada, Janit Bern, MD  meloxicam (MOBIC) 15 MG tablet Take 15 mg by mouth daily.   Yes [provider]  omeprazole (PRILOSEC) 20 MG capsule Take 1 capsule (20 mg total) by mouth daily. 01/05/16  Yes Lada, Janit Bern, MD  QUEtiapine (SEROQUEL) 100 MG tablet Take 100-200 mg by mouth 4 (four) times daily. Pt takes one tablet three times a day and two tablets at bedtime.   Yes  [provider]  ranitidine (ZANTAC) 150 MG tablet TAKE 1 TABLET BY MOUTH 2 TIMES DAILY FOR HEARTBURN ,REFLUX 09/15/16  Yes Lada, Janit Bern, MD  albuterol (PROVENTIL HFA) 108 (90 Base) MCG/ACT inhaler Inhale 2 puffs into the lungs every 4 (four) hours as needed for wheezing or shortness of breath. Appointment needed for any further refills 11/09/16   Lada, Janit Bern, MD  atorvastatin (LIPITOR) 20 MG tablet TAKE 1 TABLET BY MOUTH AT BEDTIME. Patient not taking: Reported on 12/08/2016 07/20/16   Kerman Passey, MD  diazepam (VALIUM) 10 MG tablet Take 5 mg by mouth 4 (four) times daily as needed.  07/11/13   [provider]  levothyroxine (SYNTHROID, LEVOTHROID) 50 MCG tablet TAKE 1 TABLET BY MOUTH DAILY. Patient not taking: Reported on 12/08/2016 07/20/16   Kerman Passey, MD  potassium chloride (KLOR-CON 10) 10 MEQ tablet One by mouth twice a day for three days, then one a day until finished Patient not taking: Reported on 04/06/2016 01/06/16   Kerman Passey, MD  traMADol (ULTRAM) 50 MG tablet Take 1 tablet (50 mg total) by mouth every 6 (six) hours as needed. Patient not taking: Reported on 12/08/2016 09/22/16 09/22/17  Joni Reining, PA-C    Allergies Acetaminophen  Family History  Problem Relation Age of Onset  . Diabetes Mother   . Hyperlipidemia Mother   . Hypertension Mother   . Diabetes Maternal Aunt   . Cancer Maternal Aunt   . Hyperlipidemia Maternal Aunt   . Hypertension Maternal Aunt   . Diabetes Maternal Uncle   . Cancer Maternal Uncle   . Hyperlipidemia Maternal Uncle   . Hypertension Maternal Uncle     Social History Social History  Substance Use Topics  . Smoking status: Current Every Day Smoker    Types: Cigarettes  . Smokeless tobacco: Never Used  . Alcohol use No     Comment: Sober for 12 yrs.    Review of Systems  Constitutional: No fever/chills Eyes: No visual changes. ENT: No sore throat. Cardiovascular: Denies chest pain. Respiratory: as  above. Gastrointestinal: No abdominal pain.  No nausea, no vomiting.  No diarrhea.  No constipation. Genitourinary: Negative for dysuria. Musculoskeletal: Negative for back pain. Skin: Negative for rash. Neurological: Negative for headaches, focal weakness or numbness.   ____________________________________________   PHYSICAL EXAM:  VITAL SIGNS: ED Triage Vitals  Enc Vitals Group     BP 12/08/16 0921 114/80     Pulse Rate 12/08/16 0921 80     Resp 12/08/16 0921 16     Temp 12/08/16 0921 98.2 F (36.8 C)     Temp Source 12/08/16 0921 Oral     SpO2 12/08/16 0921 100 %     Weight 12/08/16 0914 220 lb (99.8 kg)     Height 12/08/16 0914 6\' 1"  (1.854 m)     Head Circumference --      Peak Flow --      Pain Score 12/08/16 0913 7  Pain Loc --      Pain Edu? --      Excl. in GC? --     Constitutional: Alert and oriented. Well appearing and in no acute distress. Eyes: Conjunctivae are normal.  Head: Atraumatic. Normal TMs bilaterally. Nose: Bilateral clear rhinorrhea. Mouth/Throat: Mucous membranes are moist.  Neck: No stridor.   Cardiovascular: Normal rate, regular rhythm. Grossly normal heart sounds.   Respiratory: Normal respiratory effort.  No retractions. Coarse wheezing throughout with a prolonged expiratory phase. Gastrointestinal: Soft and nontender. No distention.  Musculoskeletal: No lower extremity edema nor effusions. However, the patient is tender diffusely to the left knee without any ecchymosis or deformity.  Neurologic:  Normal speech and language. No gross focal neurologic deficits are appreciated. Skin:  Skin is warm, dry and intact. No rash noted. Psychiatric: Mood and affect are normal. Speech and behavior are normal.  ____________________________________________   LABS (all labs ordered are listed, but only abnormal results are displayed)  Labs Reviewed  BASIC METABOLIC PANEL - Abnormal; Notable for the following:       Result Value   Chloride 100  (*)    All other components within normal limits  CBC WITH DIFFERENTIAL/PLATELET   ____________________________________________  EKG  ED ECG REPORT I, Schaevitz,  Teena Irani, the attending physician, personally viewed and interpreted this ECG.   Date: 12/08/2016  EKG Time: 0913  Rate: 96  Rhythm: normal sinus rhythm with frequent PVCs  Axis: Normal  Intervals:none  ST&T Change: No ST segment elevation or depression. No abnormal T-wave inversion.  ____________________________________________  RADIOLOGY  No acute findings on the chest x-ray nor the knee x-ray. ____________________________________________   PROCEDURES  Procedure(s) performed:   Procedures  Critical Care performed:   ____________________________________________   INITIAL IMPRESSION / ASSESSMENT AND PLAN / ED COURSE  Pertinent labs & imaging results that were available during my care of the patient were reviewed by me and considered in my medical decision making (see chart for details).   Differential includes, but is not limited to, viral syndrome, bronchitis including COPD exacerbation, pneumonia, reactive airway disease including asthma, CHF including exacerbation with or without pulmonary/interstitial edema, pneumothorax, ACS, thoracic trauma, and pulmonary embolism. Knee contusion. Knee fracture. Soft tissue injury of the knee.  As part of my medical decision making, I reviewed the following data within the electronic MEDICAL RECORD NUMBER Notes from prior ED visits  Patient likely with COPD exacerbation secondary to URI. To be reassessed after treatment with steroids and nebulizers.     ----------------------------------------- 12:46 PM on 12/08/2016 -----------------------------------------  Patient with improved air movement but still with wheezing throughout. I offered the patient admission but the patient says that he cannot stay because her responsibilities at home including his dog. I discussed  the severity of his breathing issue at this time with the patient is refusing further care and will be discharged AGAINST MEDICAL ADVICE. We discussed returning to the emergency department immediately for any worsening or concerning symptoms. He is aware of the risks of death, permanent disability and is willing to accept these risks. The patient has capacity to make medical decisions at this time. He understands and has insight into his medical condition. He is not currently intoxicated.  ____________________________________________   FINAL CLINICAL IMPRESSION(S) / ED DIAGNOSES  COPD exacerbation. Knee contusion.    NEW MEDICATIONS STARTED DURING THIS VISIT:  New Prescriptions   No medications on file     Note:  This document was prepared using Dragon voice recognition  software and may include unintentional dictation errors.     Myrna BlazerSchaevitz, David Matthew, MD 12/08/16 1247

## 2016-12-24 ENCOUNTER — Other Ambulatory Visit: Payer: Self-pay

## 2016-12-24 ENCOUNTER — Emergency Department
Admission: EM | Admit: 2016-12-24 | Discharge: 2016-12-24 | Disposition: A | Discharge status: Home / Self Care | Payer: Medicaid Other | Attending: Emergency Medicine | Admitting: Emergency Medicine

## 2016-12-24 ENCOUNTER — Emergency Department: Payer: Medicaid Other

## 2016-12-24 DIAGNOSIS — F17210 Nicotine dependence, cigarettes, uncomplicated: Secondary | ICD-10-CM | POA: Insufficient documentation

## 2016-12-24 DIAGNOSIS — I10 Essential (primary) hypertension: Secondary | ICD-10-CM | POA: Insufficient documentation

## 2016-12-24 DIAGNOSIS — R55 Syncope and collapse: Secondary | ICD-10-CM | POA: Diagnosis not present

## 2016-12-24 DIAGNOSIS — J449 Chronic obstructive pulmonary disease, unspecified: Secondary | ICD-10-CM | POA: Insufficient documentation

## 2016-12-24 DIAGNOSIS — Z8673 Personal history of transient ischemic attack (TIA), and cerebral infarction without residual deficits: Secondary | ICD-10-CM | POA: Insufficient documentation

## 2016-12-24 DIAGNOSIS — M25511 Pain in right shoulder: Secondary | ICD-10-CM | POA: Diagnosis not present

## 2016-12-24 DIAGNOSIS — Z7982 Long term (current) use of aspirin: Secondary | ICD-10-CM | POA: Insufficient documentation

## 2016-12-24 DIAGNOSIS — M25512 Pain in left shoulder: Secondary | ICD-10-CM | POA: Insufficient documentation

## 2016-12-24 DIAGNOSIS — M546 Pain in thoracic spine: Secondary | ICD-10-CM

## 2016-12-24 DIAGNOSIS — Z79899 Other long term (current) drug therapy: Secondary | ICD-10-CM | POA: Diagnosis not present

## 2016-12-24 LAB — ETHANOL

## 2016-12-24 LAB — CBC WITH DIFFERENTIAL/PLATELET
BASOS ABS: 0 10*3/uL (ref 0–0.1)
BASOS PCT: 0 %
EOS PCT: 0 %
Eosinophils Absolute: 0 10*3/uL (ref 0–0.7)
HCT: 42 % (ref 40.0–52.0)
Hemoglobin: 14.1 g/dL (ref 13.0–18.0)
LYMPHS PCT: 9 %
Lymphs Abs: 1 10*3/uL (ref 1.0–3.6)
MCH: 30.2 pg (ref 26.0–34.0)
MCHC: 33.6 g/dL (ref 32.0–36.0)
MCV: 89.7 fL (ref 80.0–100.0)
Monocytes Absolute: 0.3 10*3/uL (ref 0.2–1.0)
Monocytes Relative: 2 %
Neutro Abs: 9.7 10*3/uL — ABNORMAL HIGH (ref 1.4–6.5)
Neutrophils Relative %: 89 %
PLATELETS: 328 10*3/uL (ref 150–440)
RBC: 4.68 MIL/uL (ref 4.40–5.90)
RDW: 15.1 % — ABNORMAL HIGH (ref 11.5–14.5)
WBC: 11 10*3/uL — AB (ref 3.8–10.6)

## 2016-12-24 LAB — BASIC METABOLIC PANEL
ANION GAP: 9 (ref 5–15)
BUN: 8 mg/dL (ref 6–20)
CALCIUM: 9.5 mg/dL (ref 8.9–10.3)
CO2: 24 mmol/L (ref 22–32)
Chloride: 103 mmol/L (ref 101–111)
Creatinine, Ser: 0.93 mg/dL (ref 0.61–1.24)
GLUCOSE: 104 mg/dL — AB (ref 65–99)
Potassium: 4.2 mmol/L (ref 3.5–5.1)
SODIUM: 136 mmol/L (ref 135–145)

## 2016-12-24 LAB — MAGNESIUM: MAGNESIUM: 1.8 mg/dL (ref 1.7–2.4)

## 2016-12-24 LAB — TROPONIN I

## 2016-12-24 MED ORDER — HYDROMORPHONE HCL 1 MG/ML IJ SOLN
1.0000 mg | Freq: Once | INTRAMUSCULAR | Status: AC
  Administered 2016-12-24: 1 mg via INTRAVENOUS

## 2016-12-24 MED ORDER — OXYCODONE HCL 5 MG PO TABS: 5.0000 mg | ORAL_TABLET | Freq: Four times a day (QID) | ORAL | 0 refills | Status: AC | PRN

## 2016-12-24 MED ORDER — HYDROMORPHONE HCL 1 MG/ML IJ SOLN
INTRAMUSCULAR | Status: AC
  Filled 2016-12-24: qty 1

## 2016-12-24 MED ORDER — MORPHINE SULFATE (PF) 4 MG/ML IV SOLN
6.0000 mg | Freq: Once | INTRAVENOUS | Status: AC
  Administered 2016-12-24: 6 mg via INTRAVENOUS
  Filled 2016-12-24: qty 2

## 2016-12-24 MED ORDER — DICLOFENAC SODIUM 3 % TD GEL: 1.0000 "application " | Freq: Two times a day (BID) | TRANSDERMAL | 0 refills | Status: DC | PRN

## 2016-12-24 MED ORDER — MORPHINE SULFATE (PF) 4 MG/ML IV SOLN
4.0000 mg | Freq: Once | INTRAVENOUS | Status: AC
  Administered 2016-12-24: 4 mg via INTRAVENOUS
  Filled 2016-12-24: qty 1

## 2016-12-24 MED ORDER — ONDANSETRON HCL 4 MG/2ML IJ SOLN
4.0000 mg | Freq: Once | INTRAMUSCULAR | Status: AC
Start: 2016-12-24 — End: 2016-12-24
  Administered 2016-12-24: 4 mg via INTRAVENOUS
  Filled 2016-12-24: qty 2

## 2016-12-24 NOTE — ED Triage Notes (Signed)
Pt arrived via EMS from Home with loss of consciousness. Pt reports he was sitting at home drinking coffee when he started coughing and then he passed out. He has complaints of bilateral shoulder pain and lower neck pain. Pt has a deformity to the right posterior part of shoulder and a Hx of surgery to the left clavicle. Pt reports seeing "spots". Pt has abrasions on forehead. VS per EMS BP-144/90 HR-103 BS-113. Pt is alert and oriented x4, calm, and relaxed. Doctor is at bedside.

## 2016-12-24 NOTE — ED Notes (Signed)
Patient taken to MRI

## 2016-12-24 NOTE — ED Provider Notes (Addendum)
[facility] Emergency Department Provider Note  ____________________________________________   First MD Initiated Contact with Patient 12/24/16 [id number]     (approximate)  I have reviewed the triage vital signs and the nursing notes.   HISTORY  Chief Complaint Loss of Consciousness   HPI Scott Howell is a 46 y.o. male with history of COPD as well as bipolar disorder and schizophrenia who is presenting to the emergency department after a syncopal episode.  Patient says that he was sitting on his bed watching television when he had a coughing fit and then passed out.  He thinks that he was on the ground for about 30 minutes.  He suffered an abrasion to his forehead and is now complaining of pain to the base of his neck as well as his bilateral shoulders and inability to move his arms bilaterally secondary to pain.  However, he denies any numbness to his hands bilaterally and is able to move his fingers.  Patient denies any chest pain or shortness of breath at this time.  I saw this patient several weeks ago for COPD exacerbation.  He says he is still coughing and still smokes.   Past Medical History:  Diagnosis Date  . Bipolar 1 disorder (HCC)   . COPD (chronic obstructive pulmonary disease) (HCC)   . GERD (gastroesophageal reflux disease)   . High cholesterol   . Hypertension   . Schizophrenia (HCC)   . Stroke [facility](HCC)     Patient Active Problem List   Diagnosis Date Noted  . Abnormal thyroid blood test 04/06/2016  . Encounter for screening for HIV 01/05/2016  . Abnormal weight gain 01/05/2016  . Chronic prescription benzodiazepine use 10/19/2015  . Elevated liver enzymes 08/20/2015  . Medication monitoring encounter 08/20/2015  . Hypokalemia 08/20/2015  . Erectile dysfunction 04/22/2015  . Status post hardware removal 04/20/2015  . Bipolar 2 disorder (HCC) 07/29/2014  . Marijuana abuse 07/29/2014  . Chronic knee pain 07/29/2014  . Bipolar 1 disorder,  mixed (HCC)   . Drug abuse, opioid type (HCC) 06/28/2013  . GERD without esophagitis 10/03/2012  . Hypertension goal BP (blood pressure) < 140/90 10/03/2012  . Chronic pain associated with significant psychosocial dysfunction 07/17/2012  . Lumbar canal stenosis 04/09/2012  . Hypercholesterolemia 01/06/2012  . CN (constipation) 11/11/2011  . 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 Decreased motor strength 09/30/2010  . Current tobacco use 09/30/2010  . H/O transient cerebral ischemia 09/09/2010  . LBP (low back pain) 08/30/2010  . Cervical spinal cord compression (HCC) 07/22/2010  . Cervical post-laminectomy syndrome 07/22/2010  . Chronic obstructive pulmonary disease (HCC) 02/23/2010    Past Surgical History:  Procedure Laterality Date  . CERVICAL SPINE SURGERY     4  . KNEE SURGERY Left   . SHOULDER SURGERY Left     Prior to Admission medications   Medication Sig Start Date End Date Taking? Authorizing Provider  albuterol (PROVENTIL HFA) 108 (90 Base) MCG/ACT inhaler Inhale 2 puffs into the lungs every 4 (four) hours as needed for wheezing or shortness of breath. Appointment needed for any further refills 11/09/16   Lada, Janit BernMelinda P, MD  amLODipine (NORVASC) 5 MG tablet TAKE 1 TABLET BY MOUTH DAILY. 04/12/16   Kerman PasseyLada, Melinda P, MD  aspirin 81 MG tablet Take 81 mg by mouth. 06/17/10   [provider]  atorvastatin (LIPITOR) 20 MG tablet TAKE 1 TABLET BY MOUTH AT BEDTIME. Patient not taking: Reported on 12/08/2016 07/20/16   Baruch GoutyLada, Melinda  P, MD  diazepam (VALIUM) 10 MG tablet Take 5 mg by mouth 4 (four) times daily as needed.  07/11/13   [provider]  doxycycline (VIBRAMYCIN) 100 MG capsule Take 1 capsule (100 mg total) by mouth 2 (two) times daily. 12/08/16   Myrna Blazer, MD  DULoxetine (CYMBALTA) 60 MG capsule Take 60 mg by mouth 2 (two) times daily.     [provider]  fluticasone (FLOVENT HFA) 110 MCG/ACT inhaler Inhale 2 puffs into the lungs  2 (two) times daily. 05/17/16   Lada, Janit Bern, MD  levothyroxine (SYNTHROID, LEVOTHROID) 50 MCG tablet TAKE 1 TABLET BY MOUTH DAILY. Patient not taking: Reported on 12/08/2016 07/20/16   Kerman Passey, MD  meloxicam (MOBIC) 15 MG tablet Take 15 mg by mouth daily.    [provider]  omeprazole (PRILOSEC) 20 MG capsule Take 1 capsule (20 mg total) by mouth daily. 01/05/16   Kerman Passey, MD  potassium chloride (KLOR-CON 10) 10 MEQ tablet One by mouth twice a day for three days, then one a day until finished Patient not taking: Reported on 04/06/2016 01/06/16   Kerman Passey, MD  predniSONE (DELTASONE) 50 MG tablet Take 1 tab PO Daily 12/08/16   Myrna Blazer, MD  QUEtiapine (SEROQUEL) 100 MG tablet Take 100-200 mg by mouth 4 (four) times daily. Pt takes one tablet three times a day and two tablets at bedtime.    [provider]  ranitidine (ZANTAC) 150 MG tablet TAKE 1 TABLET BY MOUTH 2 TIMES DAILY FOR HEARTBURN ,REFLUX 09/15/16   Lada, Janit Bern, MD  traMADol (ULTRAM) 50 MG tablet Take 1 tablet (50 mg total) by mouth every 6 (six) hours as needed. Patient not taking: Reported on 12/08/2016 09/22/16 09/22/17  Joni Reining, PA-C    Allergies Acetaminophen  Family History  Problem Relation Age of Onset  . Diabetes Mother   . Hyperlipidemia Mother   . Hypertension Mother   . Diabetes Maternal Aunt   . Cancer Maternal Aunt   . Hyperlipidemia Maternal Aunt   . Hypertension Maternal Aunt   . Diabetes Maternal Uncle   . Cancer Maternal Uncle   . Hyperlipidemia Maternal Uncle   . Hypertension Maternal Uncle     Social History Social History   Tobacco Use  . Smoking status: Current Every Day Smoker    Types: Cigarettes  . Smokeless tobacco: Never Used  Substance Use Topics  . Alcohol use: No    Alcohol/week: 0.0 oz    Comment: Sober for 12 yrs.  . Drug use: No    Review of Systems  Constitutional: No fever/chills Eyes: Seeing "flashing lights" to  both visual fields.   ENT: No sore throat. Cardiovascular: Denies chest pain. Respiratory: Denies shortness of breath. Gastrointestinal: No abdominal pain.  No nausea, no vomiting.  No diarrhea.  No constipation. Genitourinary: Negative for dysuria. Musculoskeletal: as above Skin: Negative for rash. Neurological: Negative for headaches, focal weakness or numbness.   ____________________________________________   PHYSICAL EXAM:  VITAL SIGNS: ED Triage Vitals  Enc Vitals Group     BP 12/24/16 0703 (!) 134/92     Pulse Rate 12/24/16 0703 89     Resp 12/24/16 0703 18     Temp 12/24/16 0703 99 F (37.2 C)     Temp Source 12/24/16 0703 Oral     SpO2 12/24/16 0703 97 %     Weight 12/24/16 0702 230 lb (104.3 kg)     Height  12/24/16 0702 6\' 1"  (1.854 m)     Head Circumference --      Peak Flow --      Pain Score 12/24/16 0702 10     Pain Loc --      Pain Edu? --      Excl. in GC? --     Constitutional: Alert and oriented. Well appearing and in no acute distress. Eyes: Conjunctivae are normal.  Head: 2 areas of abrasion each about 3 cm and circular over the anterior forehead.  No active bleeding.  No depression or surrounding erythema or pus. Nose: No congestion/rhinnorhea. Mouth/Throat: Mucous membranes are moist.  Neck: No stridor.   Cardiovascular: Normal rate, regular rhythm. Grossly normal heart sounds.  Good peripheral circulation. Respiratory: Normal respiratory effort.  No retractions. Lungs CTAB. Gastrointestinal: Soft and nontender. No distention. No CVA tenderness. Musculoskeletal: No lower extremity tenderness nor edema.  No joint effusions.  Patient with midline as well as lateral tenderness starting at C7.  Tenderness palpation of the bilateral scapula as well as over the musculature overlying the bilateral scapula.  Patient not moving either arm at the shoulder secondary to pain.  However, is able to move his bilateral fingers and is sensate to light touch with  brisk capillary refill bilaterally.  Also not moving his elbows bilaterally.  Neurologic:  Normal speech and language. No gross focal neurologic deficits are appreciated. Skin:  Skin is warm, dry and intact. No rash noted. Psychiatric: Mood and affect are normal. Speech and behavior are normal.  ____________________________________________   LABS (all labs ordered are listed, but only abnormal results are displayed)  Labs Reviewed  CBC WITH DIFFERENTIAL/PLATELET - Abnormal; Notable for the following components:      Result Value   WBC 11.0 (*)    RDW 15.1 (*)    Neutro Abs 9.7 (*)    All other components within normal limits  BASIC METABOLIC PANEL - Abnormal; Notable for the following components:   Glucose, Bld 104 (*)    All other components within normal limits  TROPONIN I  MAGNESIUM  ETHANOL  TROPONIN I   ____________________________________________  EKG  ED ECG REPORT I, Arelia Longest, the attending physician, personally viewed and interpreted this ECG.   Date: 12/24/2016  EKG Time: 0733  Rate: 94  Rhythm: normal sinus rhythm  Axis: Normal  Intervals:none  ST&T Change: No ST segment elevation or depression.  No abnormal T wave inversion.  ____________________________________________  RADIOLOGY  Patient without any acute findings on his CT of the chest.  CT of the head with paranasal sinus disease but without any intracranial pathology.  CT cervical spine with postoperative changes without any obvious cervical or acute pathology.  MRI of the cervical spine without any acute pathology.  No signs of central cord syndrome.  Multilevel foraminal stenosis and postsurgical changes. ____________________________________________   PROCEDURES  Procedure(s) performed:   Procedures  Critical Care performed:   ____________________________________________   INITIAL IMPRESSION / ASSESSMENT AND PLAN / ED COURSE  Pertinent labs & imaging results that were  available during my care of the patient were reviewed by me and considered in my medical decision making (see chart for details).  DDX: Syncope, PVCs, alcohol intoxication, herniated disc, muscle spasm  As part of my medical decision making, I reviewed the following data within the electronic MEDICAL RECORD NUMBER Notes from prior ED visits  Patient placed in cervical collar.  Last tetanus shot patient states was 4 years ago.    -----------------------------------------  2:13 PM on 12/24/2016 -----------------------------------------  Patient still with pain with motion of his bilateral shoulders.  However, the patient is almost able to raise his left arm to the midline but with pain over the musculature of his high thoracic back.  Ranges the right shoulder to about 45 degrees of abduction with pain over the right superior thoracic back.  Tenderness to palpation over these muscle groups but without any tenderness to palpation of the midline cervical spine.  Patient will be discharged with several Percocet as well as diclofenac.  I do not see any indication that he has injured his spine.  No cord pathology.  Patient does understand the diagnosis as well as the treatment plan willing to comply.  He does take clonazepam at home and he is aware not to take his Percocet along with his clonazepam. ____________________________________________   FINAL CLINICAL IMPRESSION(S) / ED DIAGNOSES  Syncope.  Back pain.  Bilateral shoulder pain.    NEW MEDICATIONS STARTED DURING THIS VISIT:  This SmartLink is deprecated. Use AVSMEDLIST instead to display the medication list for a patient.   Note:  This document was prepared using Dragon voice recognition software and may include unintentional dictation errors.     Myrna BlazerSchaevitz,  Matthew, MD 12/24/16 1414  Patient's narcotic database reviewed.  No recent opiate prescriptions.   Myrna BlazerSchaevitz,  Matthew, MD 12/24/16 [phone]

## 2016-12-24 NOTE — ED Notes (Signed)
Pain medication administered per MD order. Pt's urinal emptied. Pt is noted to be alert and oriented and pleasant to converse with. Will continue to monitor for further patient needs.

## 2016-12-24 NOTE — ED Notes (Signed)
Pt returned from CT °

## 2016-12-24 NOTE — ED Notes (Signed)
Patient transported to CT 

## 2017-01-04 ENCOUNTER — Other Ambulatory Visit: Payer: Self-pay | Admitting: Family Medicine

## 2017-01-04 ENCOUNTER — Telehealth: Payer: Self-pay | Admitting: Family Medicine

## 2017-01-04 DIAGNOSIS — J449 Chronic obstructive pulmonary disease, unspecified: Secondary | ICD-10-CM

## 2017-01-04 NOTE — Telephone Encounter (Signed)
Copied from CRM [id number]. Topic: Quick Communication - See Telephone Encounter >> Jan 04, 2017 10:34 AM Guinevere FerrariMorris, Zia Kanner E, NT wrote: CRM for notification. See Telephone encounter for: 11/21  is calling in to get a refill on his inhaler with steroids inside. Pt uses [facility]  01/04/17.

## 2017-01-04 NOTE — Telephone Encounter (Signed)
Please contact patient This is the LAST refill of his rescue inhaler I am going to supply He needs an appointment We haven't seen him since February for an acute visit He has not had his labs done He's been to the ER three times with no follow-up here after those visits I will supply one refill of the rescue inhaler; if he does not come in for an appointment, we'll have to consider him noncompliant and discharge him from the practice I really want to care for him but need to see him to do so Thank you

## 2017-01-17 ENCOUNTER — Ambulatory Visit: Payer: Self-pay | Admitting: Family Medicine

## 2017-04-20 ENCOUNTER — Other Ambulatory Visit: Payer: Self-pay | Admitting: Family Medicine

## 2017-04-20 NOTE — Telephone Encounter (Signed)
Patient needs an appointment please

## 2017-04-20 NOTE — Telephone Encounter (Signed)
Called pt no answer. LM for pt to call back and schedule an appt. CRM created.  

## 2017-06-30 ENCOUNTER — Other Ambulatory Visit: Payer: Self-pay | Admitting: Family Medicine

## 2017-06-30 NOTE — Telephone Encounter (Signed)
Patient has not been seen in over a year I have directed in previous note that he needed an appointment He canceled his appt here in December 2018 Direct him to urgent care if urgently needing inhaler or if he's sick He needs an appointment please

## 2017-06-30 NOTE — Telephone Encounter (Signed)
Pt notified, but does want to transfer care to another provider in office, I told pt he would need to speak with office manager

## 2019-04-03 IMAGING — CR DG KNEE COMPLETE 4+V*L*
4 series · 4 of 4 positions shown · non-contrast
Comparison: Left knee x-rays dated October 11, 2015.

CLINICAL DATA: Medial knee pain after fall.

EXAM:
LEFT KNEE - COMPLETE 4+ VIEW

[knee ap]
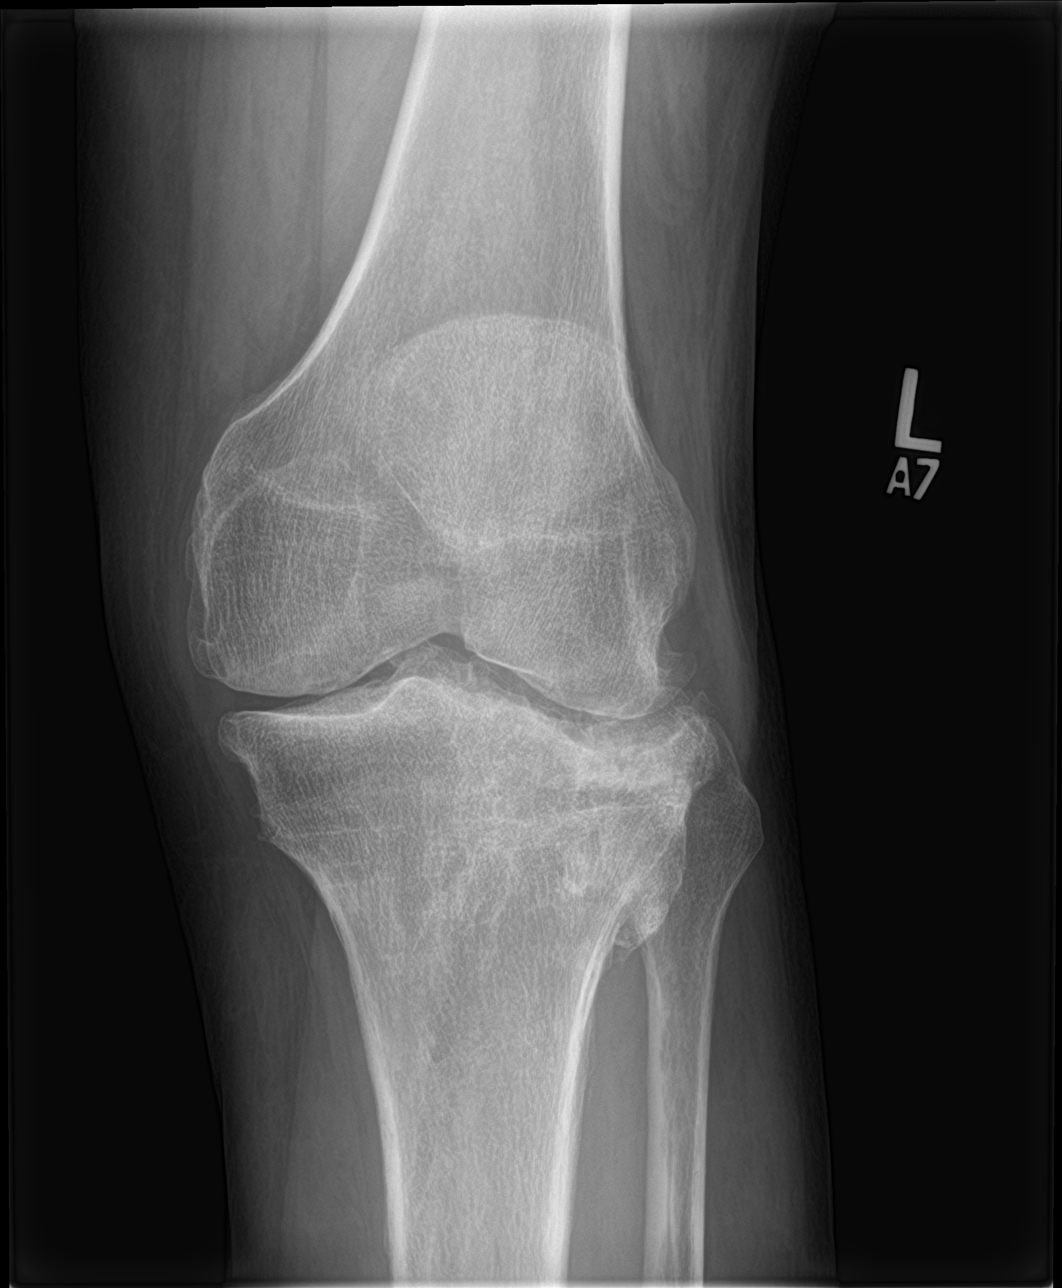

[knee obl (1 of 2)]
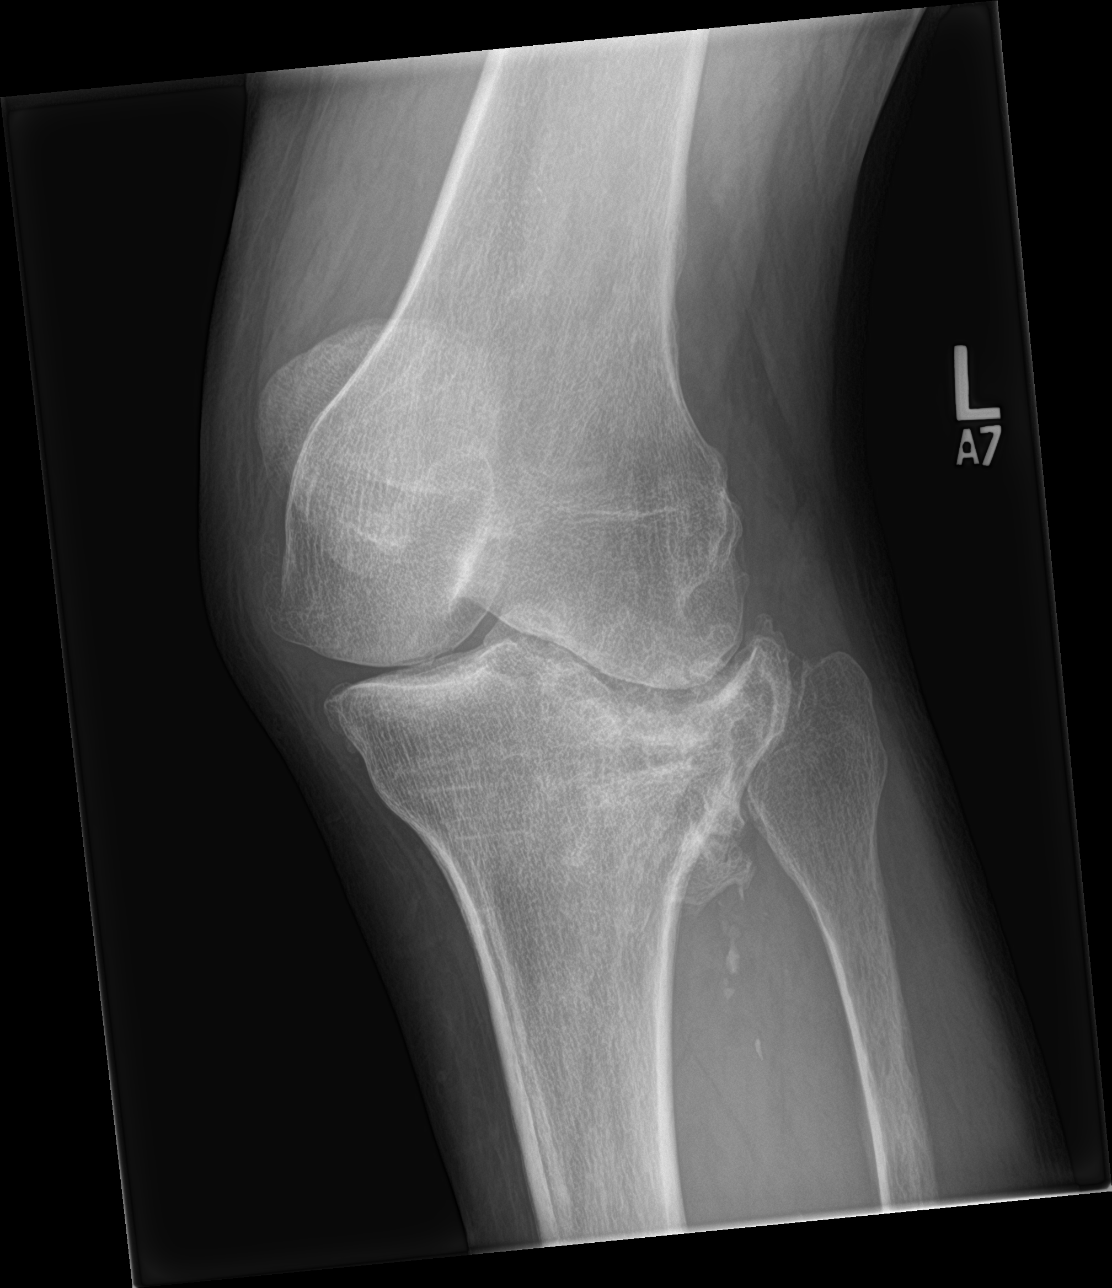

[knee obl (2 of 2)]
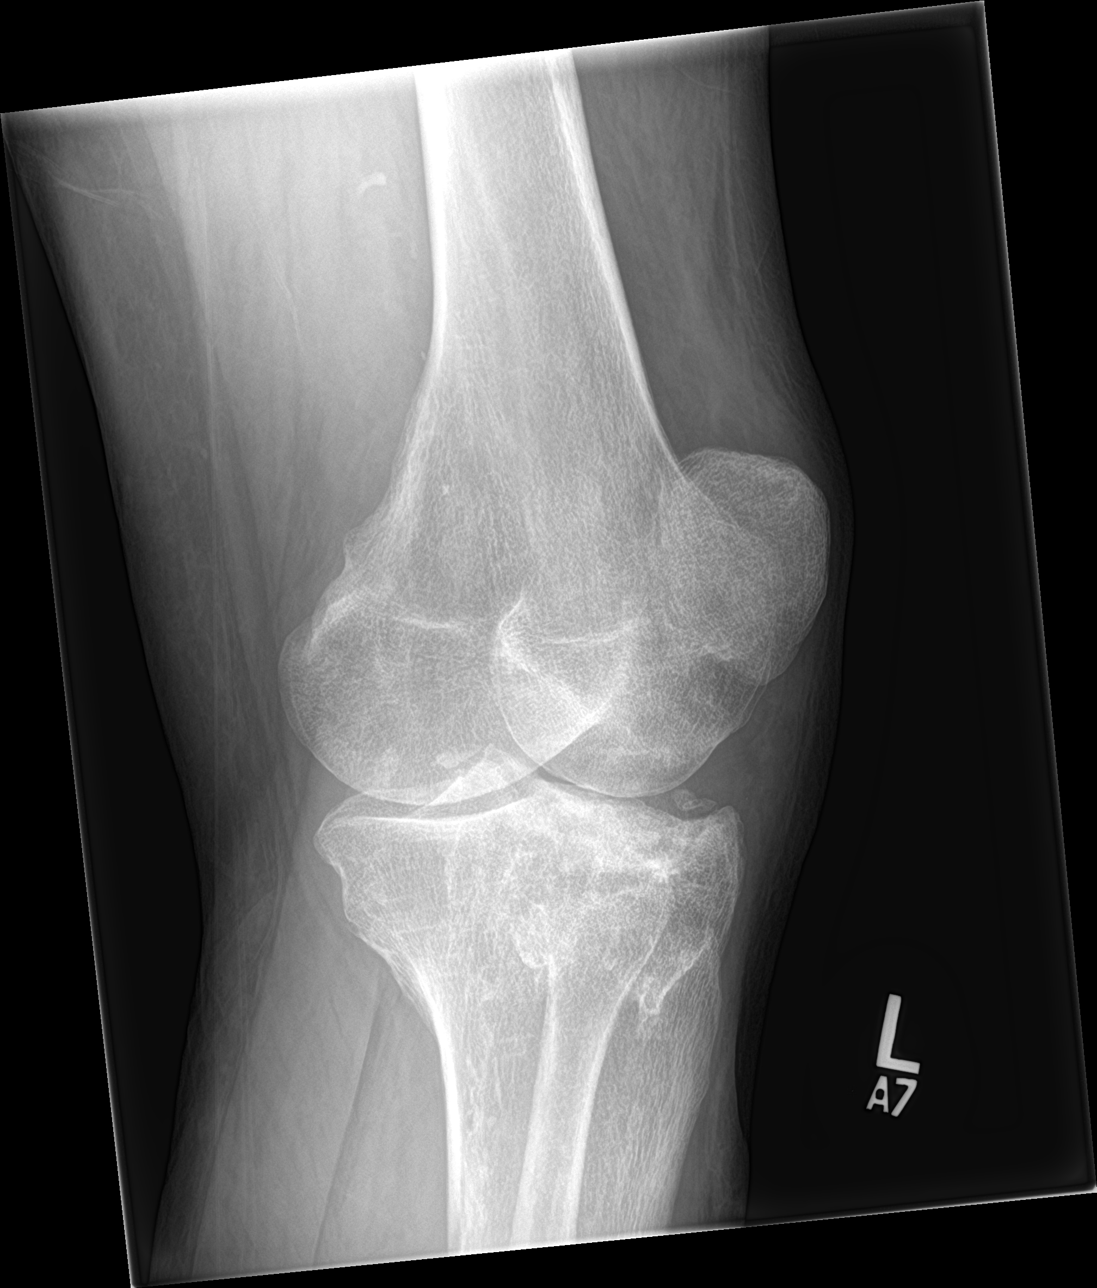

[knee lat]
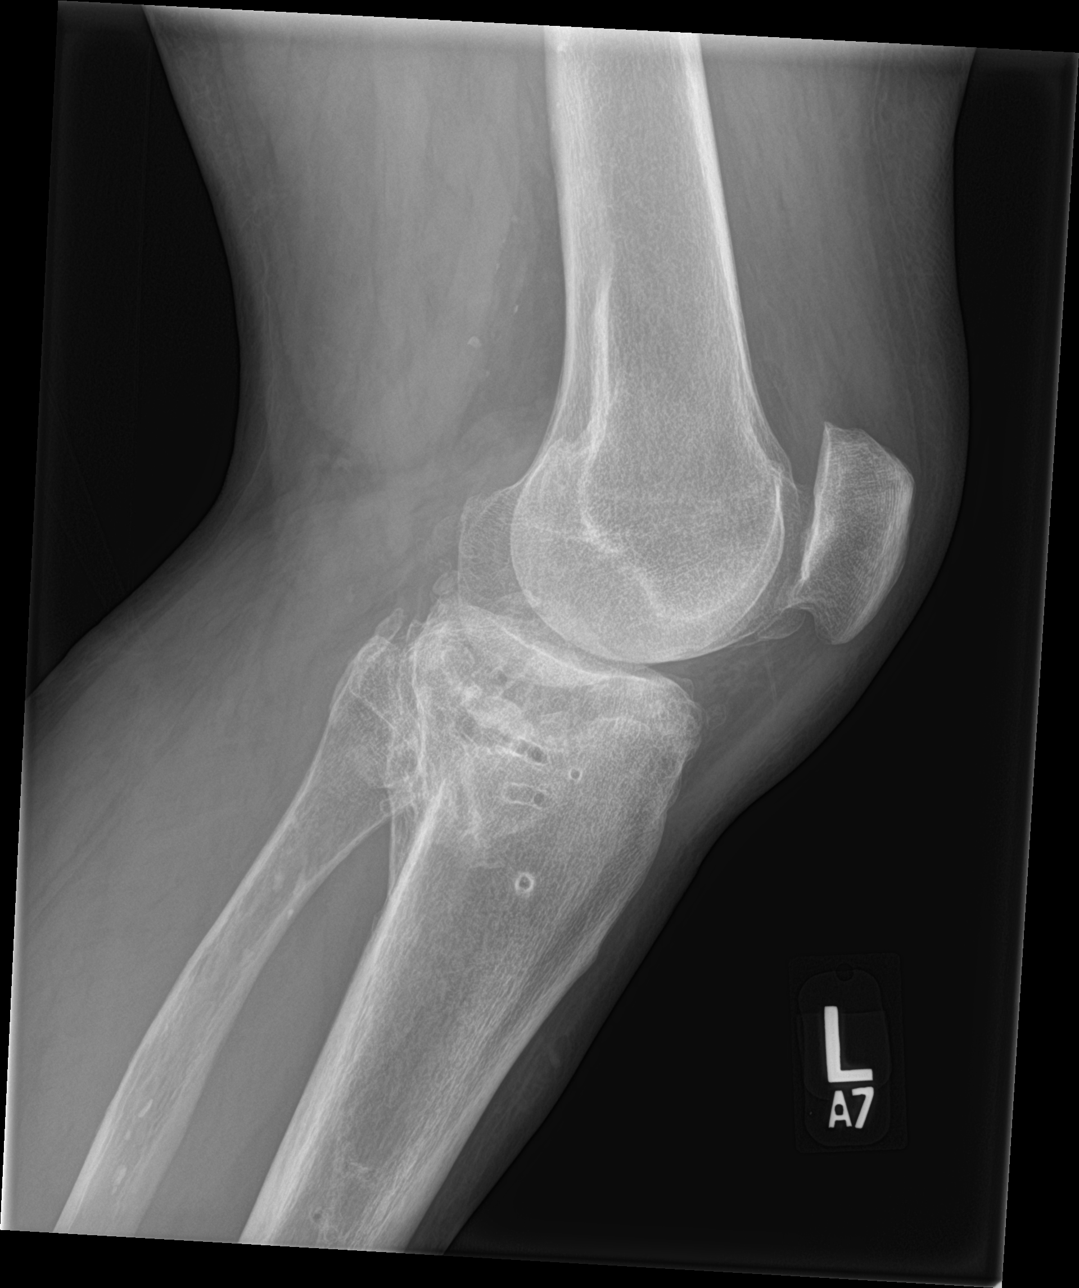

[4 of 4 positions shown; findings below may reference images not displayed]

FINDINGS: No acute fracture or malalignment. Chronic depressed fracture of the
lateral tibial plateau with hardware ghost tracts. Moderate
tricompartmental degenerative changes, similar to prior study. Trace
joint effusion.
IMPRESSION: 1.  No acute osseous abnormality.
2. Chronic depressed lateral tibial plateau fracture and moderate
tricompartmental degenerative changes are similar to prior study.

## 2019-04-03 IMAGING — CR DG CHEST 2V
1 series · 2 of 2 positions shown · non-contrast
Comparison: None.

CLINICAL DATA: Shortness of breath and cough.  Recent fall

EXAM:
CHEST  2 VIEW

[Series 1: dg chest 2 view · 0.14mm/px · 2 of 2 slices shown]
[im 1/2]
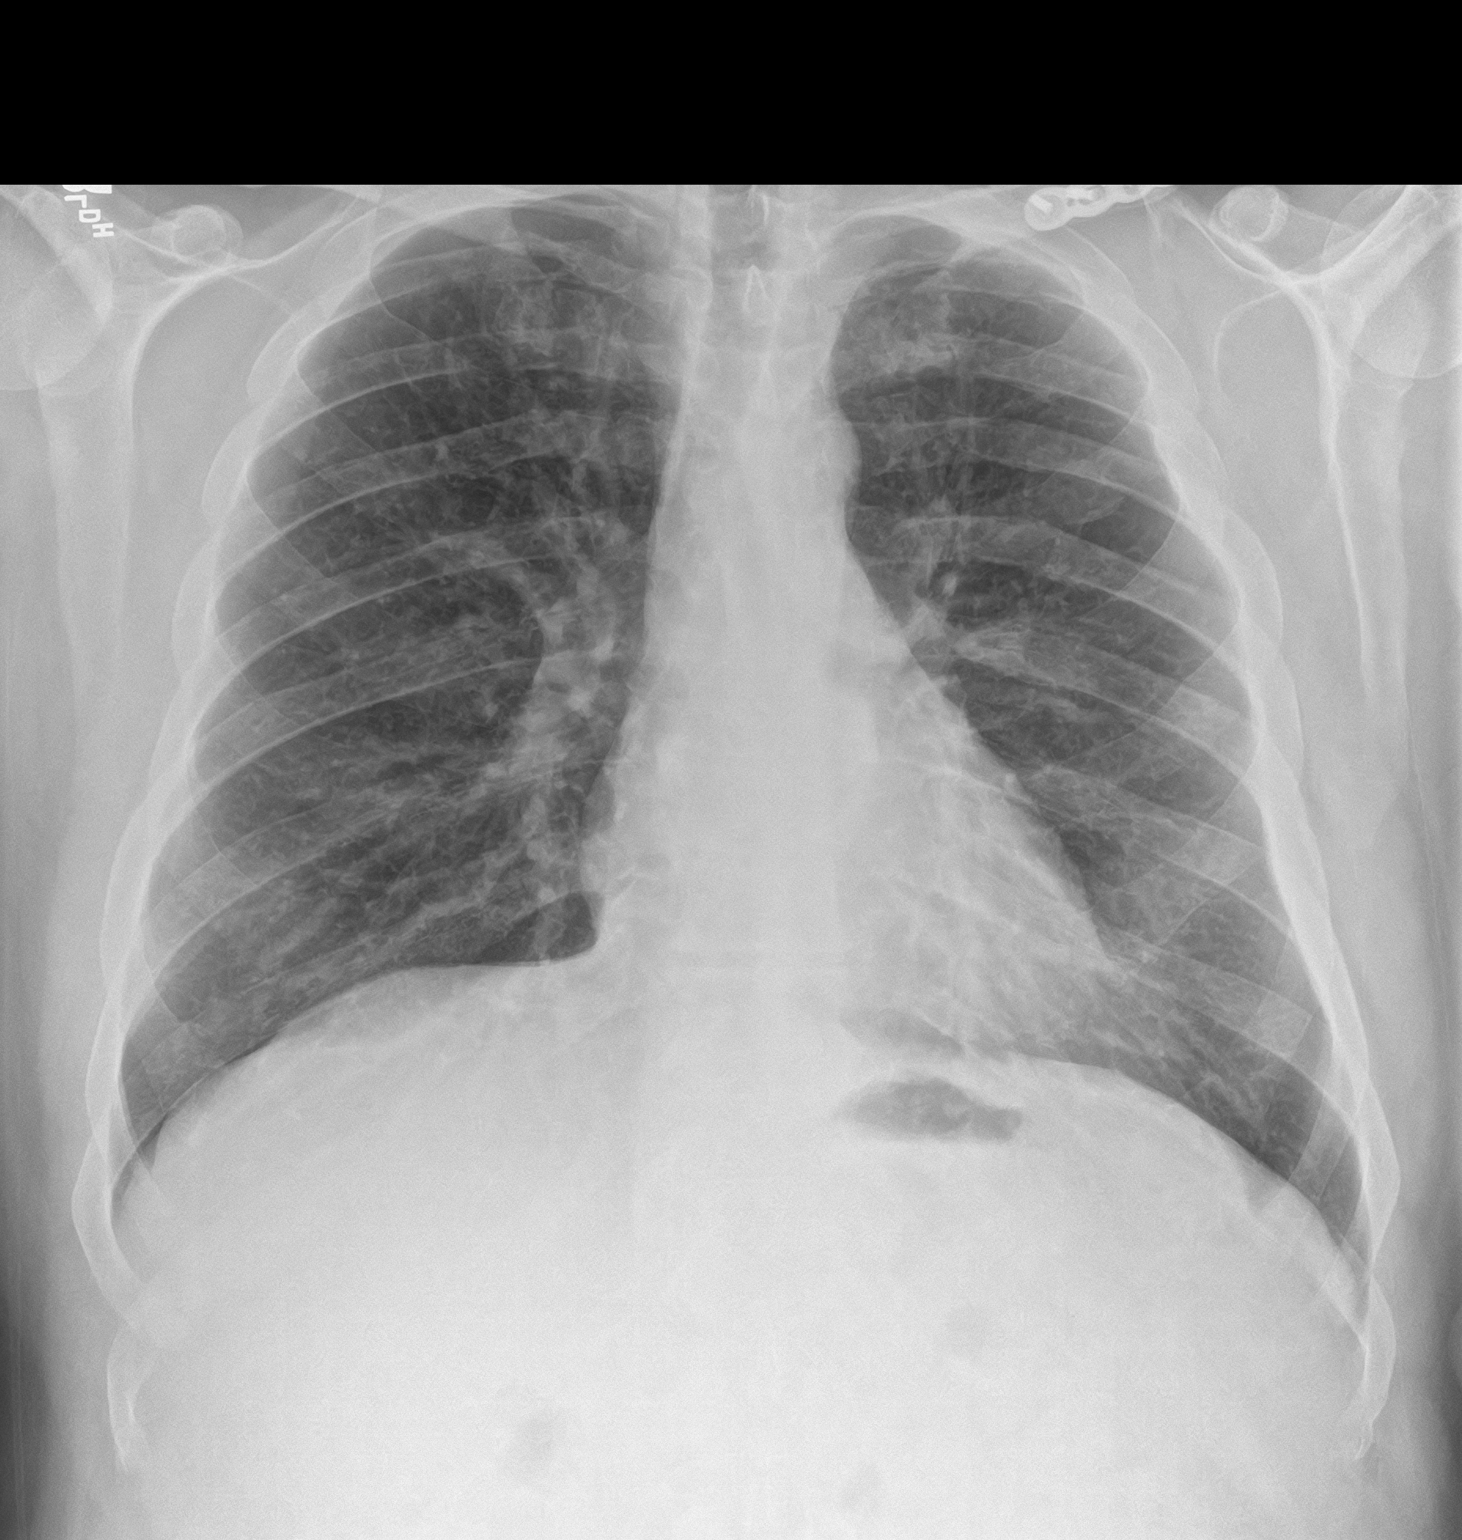
[im 2/2]
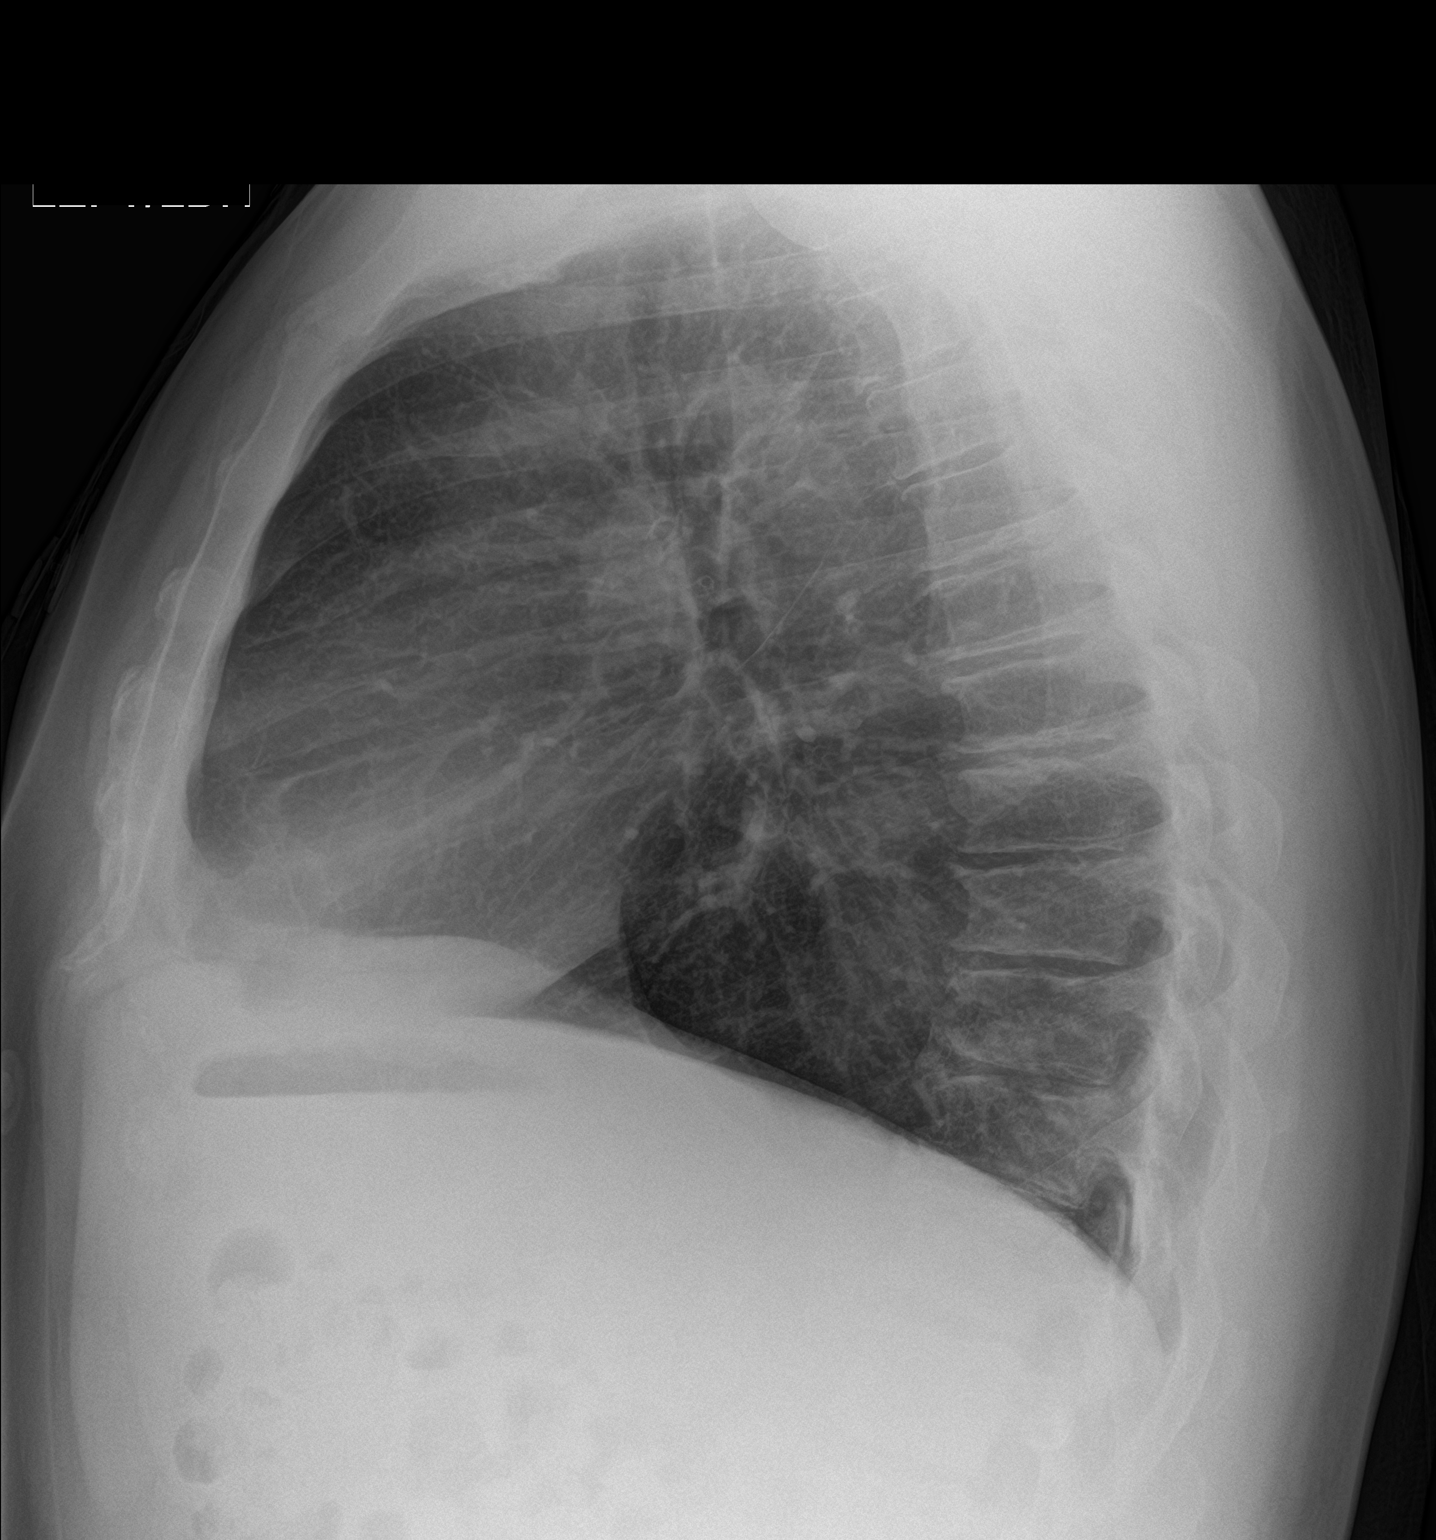

[2 of 2 positions shown; findings below may reference images not displayed]

FINDINGS: The lungs are clear. The heart size and pulmonary vascularity are
normal. No adenopathy. There is mild degenerative change in the
thoracic spine. There is postoperative change in the left clavicle
in lower cervical spine regions.

There is a recent appearing fracture of the left posterior third
rib. There are several older appearing left-sided rib fractures. No
pneumothorax.
IMPRESSION: Prior rib fractures on the left. Recent appearing and possibly acute
fracture posterior left third rib. No pneumothorax.

No edema or consolidation.  Cardiac silhouette within normal limits.

## 2020-11-14 DIAGNOSIS — R569 Unspecified convulsions: Secondary | ICD-10-CM

## 2020-11-14 HISTORY — DX: Unspecified convulsions: R56.9

## 2020-11-25 ENCOUNTER — Inpatient Hospital Stay
Admission: EM | Admit: 2020-11-25 | Discharge: 2020-11-30 | DRG: 100 | Disposition: A | Discharge status: Home / Self Care | Payer: Medicaid Other | Attending: Internal Medicine | Admitting: Internal Medicine

## 2020-11-25 ENCOUNTER — Emergency Department: Payer: Medicaid Other

## 2020-11-25 DIAGNOSIS — J449 Chronic obstructive pulmonary disease, unspecified: Secondary | ICD-10-CM | POA: Diagnosis present

## 2020-11-25 DIAGNOSIS — G894 Chronic pain syndrome: Secondary | ICD-10-CM | POA: Diagnosis present

## 2020-11-25 DIAGNOSIS — E7800 Pure hypercholesterolemia, unspecified: Secondary | ICD-10-CM | POA: Diagnosis present

## 2020-11-25 DIAGNOSIS — R4182 Altered mental status, unspecified: Secondary | ICD-10-CM

## 2020-11-25 DIAGNOSIS — F200 Paranoid schizophrenia: Secondary | ICD-10-CM | POA: Diagnosis present

## 2020-11-25 DIAGNOSIS — F17210 Nicotine dependence, cigarettes, uncomplicated: Secondary | ICD-10-CM | POA: Diagnosis present

## 2020-11-25 DIAGNOSIS — E872 Acidosis, unspecified: Secondary | ICD-10-CM

## 2020-11-25 DIAGNOSIS — Z7952 Long term (current) use of systemic steroids: Secondary | ICD-10-CM

## 2020-11-25 DIAGNOSIS — Z886 Allergy status to analgesic agent status: Secondary | ICD-10-CM

## 2020-11-25 DIAGNOSIS — E871 Hypo-osmolality and hyponatremia: Secondary | ICD-10-CM | POA: Diagnosis present

## 2020-11-25 DIAGNOSIS — Z8249 Family history of ischemic heart disease and other diseases of the circulatory system: Secondary | ICD-10-CM

## 2020-11-25 DIAGNOSIS — E876 Hypokalemia: Secondary | ICD-10-CM | POA: Diagnosis present

## 2020-11-25 DIAGNOSIS — Z8673 Personal history of transient ischemic attack (TIA), and cerebral infarction without residual deficits: Secondary | ICD-10-CM

## 2020-11-25 DIAGNOSIS — J9601 Acute respiratory failure with hypoxia: Secondary | ICD-10-CM | POA: Diagnosis present

## 2020-11-25 DIAGNOSIS — Z79890 Hormone replacement therapy (postmenopausal): Secondary | ICD-10-CM

## 2020-11-25 DIAGNOSIS — K219 Gastro-esophageal reflux disease without esophagitis: Secondary | ICD-10-CM | POA: Diagnosis present

## 2020-11-25 DIAGNOSIS — I639 Cerebral infarction, unspecified: Secondary | ICD-10-CM

## 2020-11-25 DIAGNOSIS — M4802 Spinal stenosis, cervical region: Secondary | ICD-10-CM | POA: Diagnosis present

## 2020-11-25 DIAGNOSIS — F315 Bipolar disorder, current episode depressed, severe, with psychotic features: Secondary | ICD-10-CM | POA: Diagnosis present

## 2020-11-25 DIAGNOSIS — E785 Hyperlipidemia, unspecified: Secondary | ICD-10-CM | POA: Diagnosis present

## 2020-11-25 DIAGNOSIS — Z833 Family history of diabetes mellitus: Secondary | ICD-10-CM

## 2020-11-25 DIAGNOSIS — G928 Other toxic encephalopathy: Secondary | ICD-10-CM | POA: Diagnosis present

## 2020-11-25 DIAGNOSIS — F3160 Bipolar disorder, current episode mixed, unspecified: Secondary | ICD-10-CM | POA: Diagnosis present

## 2020-11-25 DIAGNOSIS — I161 Hypertensive emergency: Secondary | ICD-10-CM | POA: Diagnosis present

## 2020-11-25 DIAGNOSIS — Z20822 Contact with and (suspected) exposure to covid-19: Secondary | ICD-10-CM | POA: Diagnosis present

## 2020-11-25 DIAGNOSIS — Z23 Encounter for immunization: Secondary | ICD-10-CM

## 2020-11-25 DIAGNOSIS — R569 Unspecified convulsions: Principal | ICD-10-CM

## 2020-11-25 DIAGNOSIS — Z7982 Long term (current) use of aspirin: Secondary | ICD-10-CM

## 2020-11-25 DIAGNOSIS — I10 Essential (primary) hypertension: Secondary | ICD-10-CM | POA: Diagnosis present

## 2020-11-25 DIAGNOSIS — W19XXXA Unspecified fall, initial encounter: Secondary | ICD-10-CM | POA: Diagnosis present

## 2020-11-25 DIAGNOSIS — Z79899 Other long term (current) drug therapy: Secondary | ICD-10-CM

## 2020-11-25 DIAGNOSIS — R296 Repeated falls: Secondary | ICD-10-CM | POA: Diagnosis present

## 2020-11-25 DIAGNOSIS — R001 Bradycardia, unspecified: Secondary | ICD-10-CM | POA: Diagnosis present

## 2020-11-25 DIAGNOSIS — M6282 Rhabdomyolysis: Secondary | ICD-10-CM | POA: Diagnosis present

## 2020-11-25 DIAGNOSIS — Z4659 Encounter for fitting and adjustment of other gastrointestinal appliance and device: Secondary | ICD-10-CM

## 2020-11-25 DIAGNOSIS — Z6829 Body mass index (BMI) 29.0-29.9, adult: Secondary | ICD-10-CM

## 2020-11-25 HISTORY — DX: Cerebral infarction, unspecified: I63.9

## 2020-11-25 LAB — CBC WITH DIFFERENTIAL/PLATELET
Abs Immature Granulocytes: 0.03 10*3/uL (ref 0.00–0.07)
Basophils Absolute: 0.1 10*3/uL (ref 0.0–0.1)
Basophils Relative: 1 %
Eosinophils Absolute: 0.1 10*3/uL (ref 0.0–0.5)
Eosinophils Relative: 1 %
HCT: 42.1 % (ref 39.0–52.0)
Hemoglobin: 14.9 g/dL (ref 13.0–17.0)
Immature Granulocytes: 0 %
Lymphocytes Relative: 33 %
Lymphs Abs: 3.6 10*3/uL (ref 0.7–4.0)
MCH: 31.6 pg (ref 26.0–34.0)
MCHC: 35.4 g/dL (ref 30.0–36.0)
MCV: 89.4 fL (ref 80.0–100.0)
Monocytes Absolute: 0.6 10*3/uL (ref 0.1–1.0)
Monocytes Relative: 6 %
Neutro Abs: 6.6 10*3/uL (ref 1.7–7.7)
Neutrophils Relative %: 59 %
Platelets: 246 10*3/uL (ref 150–400)
RBC: 4.71 MIL/uL (ref 4.22–5.81)
RDW: 12.9 % (ref 11.5–15.5)
WBC: 11 10*3/uL — ABNORMAL HIGH (ref 4.0–10.5)
nRBC: 0 % (ref 0.0–0.2)

## 2020-11-25 LAB — URINALYSIS, COMPLETE (UACMP) WITH MICROSCOPIC
Bilirubin Urine: NEGATIVE
Glucose, UA: NEGATIVE mg/dL
Ketones, ur: 5 mg/dL — AB
Leukocytes,Ua: NEGATIVE
Nitrite: NEGATIVE
Protein, ur: NEGATIVE mg/dL
Specific Gravity, Urine: 1.009 (ref 1.005–1.030)
Squamous Epithelial / HPF: NONE SEEN (ref 0–5)
pH: 5 (ref 5.0–8.0)

## 2020-11-25 LAB — BLOOD GAS, VENOUS
Acid-base deficit: 13.7 mmol/L — ABNORMAL HIGH (ref 0.0–2.0)
Bicarbonate: 14.2 mmol/L — ABNORMAL LOW (ref 20.0–28.0)
FIO2: 100
MECHVT: 450 mL
O2 Saturation: 99.7 %
PEEP: 5 cmH2O
Patient temperature: 37
RATE: 18 resp/min
pCO2, Ven: 39 mmHg — ABNORMAL LOW (ref 44.0–60.0)
pH, Ven: 7.17 — CL (ref 7.250–7.430)
pO2, Ven: 232 mmHg — ABNORMAL HIGH (ref 32.0–45.0)

## 2020-11-25 LAB — RESP PANEL BY RT-PCR (FLU A&B, COVID) ARPGX2
Influenza A by PCR: NEGATIVE
Influenza B by PCR: NEGATIVE
SARS Coronavirus 2 by RT PCR: NEGATIVE

## 2020-11-25 LAB — APTT: aPTT: 27 seconds (ref 24–36)

## 2020-11-25 LAB — SALICYLATE LEVEL: Salicylate Lvl: <7 mg/dL — ABNORMAL LOW (ref 7.0–30.0)

## 2020-11-25 LAB — ETHANOL: Alcohol, Ethyl (B): <10 mg/dL (ref <10)

## 2020-11-25 LAB — PROTIME-INR
INR: 1.1 (ref 0.8–1.2)
Prothrombin Time: 14.1 seconds (ref 11.4–15.2)

## 2020-11-25 LAB — ACETAMINOPHEN LEVEL: Acetaminophen (Tylenol), Serum: <10 ug/mL — ABNORMAL LOW (ref 10–30)

## 2020-11-25 MED ORDER — MIDAZOLAM HCL 2 MG/2ML IJ SOLN
2.0000 mg | Freq: Once | INTRAMUSCULAR | Status: AC
  Administered 2020-11-25: 2 mg via INTRAVENOUS

## 2020-11-25 MED ORDER — ETOMIDATE 2 MG/ML IV SOLN
INTRAVENOUS | Status: AC
  Administered 2020-11-25: 30 mg via INTRAVENOUS
  Filled 2020-11-25: qty 20

## 2020-11-25 MED ORDER — MIDAZOLAM HCL 5 MG/5ML IJ SOLN
4.0000 mg | Freq: Once | INTRAMUSCULAR | Status: AC
  Administered 2020-11-25: 4 mg via INTRAVENOUS

## 2020-11-25 MED ORDER — LEVETIRACETAM IN NACL 1000 MG/100ML IV SOLN
1000.0000 mg | Freq: Once | INTRAVENOUS | Status: AC
  Administered 2020-11-25: 1000 mg via INTRAVENOUS
  Filled 2020-11-25: qty 100

## 2020-11-25 MED ORDER — ETOMIDATE 2 MG/ML IV SOLN: 30.0000 mg | Freq: Once | INTRAVENOUS | Status: AC

## 2020-11-25 MED ORDER — PROPOFOL 1000 MG/100ML IV EMUL
0.0000 ug/kg/min | INTRAVENOUS | Status: DC
  Administered 2020-11-26 (×2): 45 ug/kg/min via INTRAVENOUS
  Administered 2020-11-26: 30 ug/kg/min via INTRAVENOUS
  Administered 2020-11-26: 40 ug/kg/min via INTRAVENOUS
  Administered 2020-11-26: 45 ug/kg/min via INTRAVENOUS
  Administered 2020-11-26: 50 ug/kg/min via INTRAVENOUS
  Administered 2020-11-27 (×2): 30 ug/kg/min via INTRAVENOUS
  Filled 2020-11-25 (×8): qty 100

## 2020-11-25 MED ORDER — LACTATED RINGERS IV BOLUS
1000.0000 mL | Freq: Once | INTRAVENOUS | Status: AC
  Administered 2020-11-26: 1000 mL via INTRAVENOUS

## 2020-11-25 MED ORDER — PROPOFOL 1000 MG/100ML IV EMUL
INTRAVENOUS | Status: AC
  Administered 2020-11-25: 5 ug/kg/min via INTRAVENOUS
  Filled 2020-11-25: qty 100

## 2020-11-25 MED ORDER — SODIUM CHLORIDE 0.9 % IV BOLUS
1000.0000 mL | Freq: Once | INTRAVENOUS | Status: AC
  Administered 2020-11-25: 1000 mL via INTRAVENOUS

## 2020-11-25 MED ORDER — ROCURONIUM BROMIDE 50 MG/5ML IV SOLN
100.0000 mg | Freq: Once | INTRAVENOUS | Status: AC
  Filled 2020-11-25: qty 10

## 2020-11-25 MED ORDER — ROCURONIUM BROMIDE 10 MG/ML (PF) SYRINGE
PREFILLED_SYRINGE | INTRAVENOUS | Status: AC
Start: 2020-11-25 — End: 2020-11-25
  Administered 2020-11-25: 100 mg via INTRAVENOUS
  Filled 2020-11-25: qty 10

## 2020-11-25 NOTE — ED Triage Notes (Signed)
Pt BIBA from home. Ems got called out for sick call with extremity and back pain. A&O, ambulatory to ems truck. As ems was pulling in to ems bay, pt postured and seized, brady in 30s. EDP at bedside Pt disoriented and combative at this time. 6 of versed given, no change.

## 2020-11-25 NOTE — ED Provider Notes (Signed)
[facility] Emergency Department Provider Note  ____________________________________________   Event Date/Time   First MD Initiated Contact with Patient 11/25/20 2258     (approximate)  I have reviewed the triage vital signs and the nursing notes.   HISTORY  Chief Complaint Seizures   HPI Scott Howell is a 50 y.o. male with past medical history of COPD, bipolar disorder, schizophrenia, HTN, HDL, CVA and GERD who presents via EMS for reportedly some shoulder pain.  Per EMS patient was somewhat incoherent on their arrival and during transportation had a seizure lasting less than 30 seconds and resolved prior to any medications being given.  Following this patient was noted be bradycardic in the 30s and seemingly postictal and very difficult to redirect violently thrashing back-and-forth attempting to The stretcher.  No other history is available patient arrival.  On arrival patient does not answer any direct questions.         Past Medical History:  Diagnosis Date   Bipolar 1 disorder (HCC)    COPD (chronic obstructive pulmonary disease) (HCC)    GERD (gastroesophageal reflux disease)    High cholesterol    Hypertension    Schizophrenia (HCC)    Stroke [facility])     Patient Active Problem List   Diagnosis Date Noted   Abnormal thyroid blood test 04/06/2016   Encounter for screening for HIV 01/05/2016   Abnormal weight gain 01/05/2016   Chronic prescription benzodiazepine use 10/19/2015   Elevated liver enzymes 08/20/2015   Medication monitoring encounter 08/20/2015   Hypokalemia 08/20/2015   Erectile dysfunction 04/22/2015   Status post hardware removal 04/20/2015   Bipolar 2 disorder (HCC) 07/29/2014   Marijuana abuse 07/29/2014   Chronic knee pain 07/29/2014   Bipolar 1 disorder, mixed (HCC)    Drug abuse, opioid type (HCC) 06/28/2013   GERD without esophagitis 10/03/2012   Hypertension goal BP (blood pressure) < 140/90 10/03/2012    Chronic pain associated with significant psychosocial dysfunction 07/17/2012   Lumbar canal stenosis 04/09/2012   Hypercholesterolemia 01/06/2012   CN (constipation) 11/11/2011   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/2010   Decreased motor strength 09/30/2010   Current tobacco use 09/30/2010   H/O transient cerebral ischemia 09/09/2010   LBP (low back pain) 08/30/2010   Cervical spinal cord compression (HCC) 07/22/2010   Cervical post-laminectomy syndrome 07/22/2010   Chronic obstructive pulmonary disease (HCC) 02/23/2010    Past Surgical History:  Procedure Laterality Date   CERVICAL SPINE SURGERY     4   HARDWARE REMOVAL Left 04/20/2015   Procedure: HARDWARE REMOVAL left leg;  Surgeon: Deeann Saint, MD;  Location: [facility] ORS;  Service: Orthopedics;  Laterality: Left;   KNEE SURGERY Left    SHOULDER SURGERY Left     Prior to Admission medications   Medication Sig Start Date End Date Taking? Authorizing Provider  amLODipine (NORVASC) 5 MG tablet TAKE 1 TABLET BY MOUTH DAILY. Patient not taking: Reported on 11/25/2020 04/12/16   Kerman Passey, MD  aspirin 81 MG tablet Take 81 mg by mouth. Patient not taking: Reported on 11/25/2020 06/17/10   [provider]  atorvastatin (LIPITOR) 20 MG tablet TAKE 1 TABLET BY MOUTH AT BEDTIME. Patient not taking: No sig reported 07/20/16   Kerman Passey, MD  diazepam (VALIUM) 10 MG tablet Take 5 mg by mouth 4 (four) times daily as needed.  Patient not taking: Reported on 11/25/2020 07/11/13   [provider]  Diclofenac Sodium 3 % GEL Place 1  application every 12 (twelve) hours as needed onto the skin. Patient not taking: Reported on 11/25/2020 12/24/16   Myrna Blazer, MD  doxycycline (VIBRAMYCIN) 100 MG capsule Take 1 capsule (100 mg total) by mouth 2 (two) times daily. Patient not taking: Reported on 11/25/2020 12/08/16   Myrna Blazer, MD  DULoxetine (CYMBALTA) 60 MG capsule Take 60 mg by mouth 2 (two) times  daily.  Patient not taking: Reported on 11/25/2020    [provider]  fluticasone (FLOVENT HFA) 110 MCG/ACT inhaler Inhale 2 puffs into the lungs 2 (two) times daily. Patient not taking: Reported on 11/25/2020 05/17/16   Kerman Passey, MD  levothyroxine (SYNTHROID, LEVOTHROID) 50 MCG tablet TAKE 1 TABLET BY MOUTH DAILY. Patient not taking: No sig reported 07/20/16   Kerman Passey, MD  meloxicam (MOBIC) 15 MG tablet Take 15 mg by mouth daily. Patient not taking: Reported on 11/25/2020    [provider]  omeprazole (PRILOSEC) 20 MG capsule Take 1 capsule (20 mg total) by mouth daily. Patient not taking: Reported on 11/25/2020 01/05/16   Kerman Passey, MD  potassium chloride (KLOR-CON 10) 10 MEQ tablet One by mouth twice a day for three days, then one a day until finished Patient not taking: No sig reported 01/06/16   Kerman Passey, MD  predniSONE (DELTASONE) 50 MG tablet Take 1 tab PO Daily Patient not taking: Reported on 11/25/2020 12/08/16   Myrna Blazer, MD  PROVENTIL HFA 108 [id number] Base) MCG/ACT inhaler INHALE 2 PUFFS INTO THE LUNGS EVERY FOUR HOURS AS NEEDED FOR WHEEZING OR SHORTNESS OF BREATH Patient not taking: Reported on 11/25/2020 01/04/17   Kerman Passey, MD  QUEtiapine (SEROQUEL) 100 MG tablet Take 100-200 mg by mouth 4 (four) times daily. Pt takes one tablet three times a day and two tablets at bedtime. Patient not taking: Reported on 11/25/2020    [provider]  ranitidine (ZANTAC) 150 MG tablet TAKE 1 TABLET BY MOUTH 2 TIMES DAILY FOR HEARTBURN ,REFLUX Patient not taking: Reported on 11/25/2020 09/15/16   Kerman Passey, MD    Allergies Acetaminophen  Family History  Problem Relation Age of Onset   Diabetes Mother    Hyperlipidemia Mother    Hypertension Mother    Diabetes Maternal Aunt    Cancer Maternal Aunt    Hyperlipidemia Maternal Aunt    Hypertension Maternal Aunt    Diabetes Maternal Uncle    Cancer Maternal Uncle     Hyperlipidemia Maternal Uncle    Hypertension Maternal Uncle     Social History Social History   Tobacco Use   Smoking status: Every Day    Types: Cigarettes   Smokeless tobacco: Never  Substance Use Topics   Alcohol use: No    Alcohol/week: 0.0 standard drinks    Comment: Sober for 12 yrs.   Drug use: No    Review of Systems  Review of Systems  Unable to perform ROS: Mental acuity     ____________________________________________   PHYSICAL EXAM:  VITAL SIGNS: ED Triage Vitals  Enc Vitals Group     BP 11/25/20 2253 (!) 204/81     Pulse Rate 11/25/20 2247 88     Resp 11/25/20 2247 (!) 50     Temp --      Temp src --      SpO2 11/25/20 2247 (!) 80 %     Weight 11/25/20 2250 229 lb 15 oz (104.3 kg)     Height 11/25/20  2250 6\' 1"  (1.854 m)     Head Circumference --      Peak Flow --      Pain Score --      Pain Loc --      Pain Edu? --      Excl. in GC? --    Vitals:   11/25/20 2315 11/25/20 2345  BP: (!) 194/98 (!) 171/101  Pulse: (!) 152 (!) 107  Resp: (!) 22 (!) 23  Temp: 97.7 F (36.5 C) 97.6 F (36.4 C)  SpO2: 100% 100%   Physical Exam Vitals and nursing note reviewed.  Constitutional:      General: He is in acute distress.     Appearance: He is well-developed. He is ill-appearing, toxic-appearing and diaphoretic.  HENT:     Head: Normocephalic and atraumatic.     Right Ear: External ear normal.     Left Ear: External ear normal.     Nose: Nose normal.  Eyes:     Conjunctiva/sclera: Conjunctivae normal.  Cardiovascular:     Rate and Rhythm: Regular rhythm. Tachycardia present.     Heart sounds: No murmur heard. Pulmonary:     Effort: Pulmonary effort is normal. Tachypnea present. No respiratory distress.     Breath sounds: Normal breath sounds.  Abdominal:     Palpations: Abdomen is soft.     Tenderness: There is no abdominal tenderness.  Musculoskeletal:     Cervical back: Neck supple.  Skin:    General: Skin is warm.   Neurological:     Motor: No seizure activity.  Psychiatric:        Behavior: Behavior is agitated and aggressive.    Pupils are unremarkable bilaterally.  Patient is very diaphoretic.  He is not following any commands.  He is thrashing back-and-forth in the bed and kicking.  This does not seem purposeful.  There is no deformities or clear overlying skin changes of extremities face chest abdomen or back.  There is a knee brace in place in the left knee. ____________________________________________   LABS (all labs ordered are listed, but only abnormal results are displayed)  Labs Reviewed  BLOOD GAS, VENOUS - Abnormal; Notable for the following components:      Result Value   pH, Ven 7.17 (*)    pCO2, Ven 39 (*)    pO2, Ven 232.0 (*)    Bicarbonate 14.2 (*)    Acid-base deficit 13.7 (*)    All other components within normal limits  CBC WITH DIFFERENTIAL/PLATELET - Abnormal; Notable for the following components:   WBC 11.0 (*)    All other components within normal limits  URINALYSIS, COMPLETE (UACMP) WITH MICROSCOPIC - Abnormal; Notable for the following components:   Color, Urine STRAW (*)    APPearance CLEAR (*)    Hgb urine dipstick LARGE (*)    Ketones, ur 5 (*)    Bacteria, UA RARE (*)    All other components within normal limits  SALICYLATE LEVEL - Abnormal; Notable for the following components:   Salicylate Lvl <7.0 (*)    All other components within normal limits  ACETAMINOPHEN LEVEL - Abnormal; Notable for the following components:   Acetaminophen (Tylenol), Serum <10 (*)    All other components within normal limits  RESP PANEL BY RT-PCR (FLU A&B, COVID) ARPGX2  CULTURE, BLOOD (SINGLE)  PROTIME-INR  APTT  ETHANOL  LACTIC ACID, PLASMA  LACTIC ACID, PLASMA  COMPREHENSIVE METABOLIC PANEL  MAGNESIUM  TRIGLYCERIDES  CK  URINE DRUG  SCREEN, QUALITATIVE ([facility] ONLY)  PROCALCITONIN  TSH  TROPONIN I (HIGH SENSITIVITY)    ____________________________________________  EKG  Initial ECG shows sinus tachycardia with ventricular rate of 151 with ventricular bigeminy and multiple nonspecific changes throughout concerning for demand ischemia.  Unremarkable intervals ____________________________________________  RADIOLOGY  ED MD interpretation: Chest x-ray has ET tube in appropriate position without evidence of focal consolidation, overt edema, pneumothorax, effusion or other acute thoracic process.  CT head shows possible subacute infarct in the left cerebral hemisphere without mass-effect hemorrhage or other acute intracranial process.  Official radiology report(s): CT HEAD WO CONTRAST ( )  Addendum Date: 11/25/2020   ADDENDUM REPORT: 11/25/2020 23:51 ADDENDUM: These results were called by telephone at the time of interpretation on 11/25/2020 at 11:48 pm to provider [facility] , who verbally acknowledged these results. Electronically Signed   By: Helyn Numbers M.D.   On: 11/25/2020 23:51   Result Date: 11/25/2020 CLINICAL DATA:  Altered mental status, seizure, EXAM: CT HEAD WITHOUT CONTRAST TECHNIQUE: Contiguous axial images were obtained from the base of the skull through the vertex without intravenous contrast. COMPARISON:  12/24/2016 FINDINGS: Brain: Normal anatomic configuration. No abnormal intra or extra-axial mass lesion or fluid collection. No abnormal mass effect or midline shift. No evidence of acute intracranial hemorrhage. There is a subtle focus of asymmetric low-attenuation within the left cerebellar hemisphere, best seen on image # 8/3 and coronal image # 50/4 which may represent a subacute infarct. Ventricular size is normal. Cerebellum unremarkable. Vascular: Unremarkable Skull: Intact Sinuses/Orbits: Small mucous retention cysts within the right frontal sinus. Remaining paranasal sinuses are clear. The orbits are unremarkable. Other: Mastoid air cells and middle ear cavities are clear.  IMPRESSION: Possible subacute infarct within the left cerebellar hemisphere without associated mass effect or superimposed acute intracranial hemorrhage. MRI examination is recommended for further evaluation. Electronically Signed: By: Helyn Numbers M.D. On: 11/25/2020 23:44   DG Chest Portable 1 View  Result Date: 11/25/2020 CLINICAL DATA:  Post intubation EXAM: PORTABLE CHEST 1 VIEW COMPARISON:  Chest x-ray 12/08/2016, CT chest 12/24/2016 FINDINGS: Endotracheal tube with tip terminating 3 cm above the carina. The heart and mediastinal contours are within normal limits. No focal consolidation. No pulmonary edema. No pleural effusion. No pneumothorax. No acute osseous abnormality. IMPRESSION: 1. Endotracheal tube in good position. 2. No active disease. Electronically Signed   By: Tish Frederickson M.D.   On: 11/25/2020 23:09    ____________________________________________   PROCEDURES  Procedure(s) performed (including Critical Care):  Procedure Name: Intubation Date/Time: 11/25/2020 11:16 PM Performed by: Gilles Chiquito, MD Pre-anesthesia Checklist: Patient being monitored, Emergency Drugs available and Suction available Oxygen Delivery Method: Nasal cannula Preoxygenation: Pre-oxygenation with 100% oxygen Induction Type: Rapid sequence Tube size: 7.5 mm Number of attempts: 1 Airway Equipment and Method: Rigid stylet and Video-laryngoscopy Secured at: 24 cm Tube secured with: ETT holder    .Critical Care Performed by: Gilles Chiquito, MD Authorized by: Gilles Chiquito, MD   Critical care provider statement:    Critical care time (minutes):  45   Critical care was necessary to treat or prevent imminent or life-threatening deterioration of the following conditions:  Respiratory failure and CNS failure or compromise   Critical care was time spent personally by me on the following activities:  Ventilator management, review of old charts, re-evaluation of patient's condition, pulse  oximetry, ordering and review of radiographic studies, ordering and review of laboratory studies, ordering and performing treatments and interventions, examination of patient, evaluation of  patient's response to treatment and interpretation of cardiac output measurements   I assumed direction of critical care for this patient from another provider in my specialty: no     ____________________________________________   INITIAL IMPRESSION / ASSESSMENT AND PLAN / ED COURSE     Patient presents with above-stated history exam for assessment of reportedly some shoulder discomfort subsequently developing a seizure during transport as well as some bradycardia.  On arrival he is thrashing mainly back-and-forth not following any commands.  His pupils are open.  Seem to be withdrawing.  No abnormal eye movements.  He is very diaphoretic presyncope sick level.  On arrival he is tachycardic with a heart rate of 140, tachypneic at 22 and hypertensive with BP of 146/100.  Patient immediately given 6 mg of Versed without any effect.  He was subsequently intubated per procedure above with etomidate rocuronium and placed on propofol drip to undergo full evaluation.  Initial differential is quite broad and includes postictal state from seizure, metabolic derangements, sepsis, SAH, stroke ACS, toxic ingestion, metabolic derangements, and delirium from other unclear cause at this time.  EKG shows tachycardia bigeminy with multiple nonspecific changes concerning for demand ischemia.  Chest x-ray has ET tube in appropriate position without evidence of focal consolidation, overt edema, pneumothorax, effusion or other acute thoracic process.  CT head shows possible subacute infarct in the left cerebral hemisphere without mass-effect hemorrhage or other acute intracranial process.  COVID influenza PCR is negative.  CBC shows WBC count of 11 without evidence of acute anemia and normal platelets.  INR and PTT is within  normal limits.  UA shows large hemoglobin and rare bacteria with 5 ketones but no other evidence of infection.  Serum ethanol, salicylates and acetaminophen undetectable.  VBG with pH of 7.17 with a PCO2 of 39 and a bicarb of 14.2.  This reflects an acute metabolic acidosis.   Care patient signed over to assuming provider at approximately midnight.  Plan is for mid to medical ICU pending remainder of labs.  Patient given a dose of Keppra and started on propofol for sedation for intubation.  I do not believe he is appropriate for code stroke given I have no clear onset of when symptoms may have begun.   ____________________________________________   FINAL CLINICAL IMPRESSION(S) / ED DIAGNOSES  Final diagnoses:  Seizure (HCC)  Altered mental status, unspecified altered mental status type  Cerebrovascular accident (CVA), unspecified mechanism (HCC)  Metabolic acidosis    Medications  propofol (DIPRIVAN) 1000 MG/100ML infusion (20 mcg/kg/min  104.3 kg Intravenous Rate/Dose Change 11/25/20 2316)  lactated ringers bolus 1,000 mL (has no administration in time range)  levETIRAcetam (KEPPRA) IVPB 1000 mg/100 mL premix (1,000 mg Intravenous New Bag/Given 11/25/20 2356)  midazolam (VERSED) injection 2 mg (has no administration in time range)  midazolam (VERSED) injection 2 mg (has no administration in time range)  fentaNYL (SUBLIMAZE) injection 100 mcg (100 mcg Intravenous Given 11/26/20 0003)  fentaNYL (SUBLIMAZE) injection 100 mcg (has no administration in time range)  etomidate (AMIDATE) injection 30 mg (30 mg Intravenous Given 11/25/20 2252)  midazolam (VERSED) 5 MG/5ML injection 4 mg (4 mg Intravenous Given 11/25/20 2242)  midazolam (VERSED) injection 2 mg (2 mg Intravenous Given 11/25/20 2246)  rocuronium (ZEMURON) injection 100 mg (100 mg Intravenous Given 11/25/20 2252)  sodium chloride 0.9 % bolus 1,000 mL (1,000 mLs Intravenous New Bag/Given 11/25/20 2305)     ED Discharge Orders      None  Note:  This document was prepared using Dragon voice recognition software and may include unintentional dictation errors.    Gilles Chiquito, MD 11/26/20 [phone]

## 2020-11-26 ENCOUNTER — Other Ambulatory Visit: Payer: Self-pay

## 2020-11-26 ENCOUNTER — Inpatient Hospital Stay (HOSPITAL_COMMUNITY)
Admit: 2020-11-26 | Discharge: 2020-11-26 | Disposition: A | Discharge status: Home / Self Care | Payer: Medicaid Other | Attending: Nurse Practitioner | Admitting: Nurse Practitioner

## 2020-11-26 ENCOUNTER — Inpatient Hospital Stay: Payer: Medicaid Other

## 2020-11-26 DIAGNOSIS — F17210 Nicotine dependence, cigarettes, uncomplicated: Secondary | ICD-10-CM | POA: Diagnosis present

## 2020-11-26 DIAGNOSIS — E872 Acidosis, unspecified: Secondary | ICD-10-CM

## 2020-11-26 DIAGNOSIS — E785 Hyperlipidemia, unspecified: Secondary | ICD-10-CM | POA: Diagnosis present

## 2020-11-26 DIAGNOSIS — R4182 Altered mental status, unspecified: Secondary | ICD-10-CM | POA: Diagnosis not present

## 2020-11-26 DIAGNOSIS — E876 Hypokalemia: Secondary | ICD-10-CM | POA: Diagnosis present

## 2020-11-26 DIAGNOSIS — G894 Chronic pain syndrome: Secondary | ICD-10-CM | POA: Diagnosis present

## 2020-11-26 DIAGNOSIS — E7800 Pure hypercholesterolemia, unspecified: Secondary | ICD-10-CM | POA: Diagnosis present

## 2020-11-26 DIAGNOSIS — J449 Chronic obstructive pulmonary disease, unspecified: Secondary | ICD-10-CM | POA: Diagnosis present

## 2020-11-26 DIAGNOSIS — I10 Essential (primary) hypertension: Secondary | ICD-10-CM

## 2020-11-26 DIAGNOSIS — E871 Hypo-osmolality and hyponatremia: Secondary | ICD-10-CM | POA: Diagnosis present

## 2020-11-26 DIAGNOSIS — I161 Hypertensive emergency: Secondary | ICD-10-CM | POA: Diagnosis present

## 2020-11-26 DIAGNOSIS — Z6829 Body mass index (BMI) 29.0-29.9, adult: Secondary | ICD-10-CM | POA: Diagnosis not present

## 2020-11-26 DIAGNOSIS — R569 Unspecified convulsions: Secondary | ICD-10-CM | POA: Diagnosis present

## 2020-11-26 DIAGNOSIS — M6282 Rhabdomyolysis: Secondary | ICD-10-CM | POA: Diagnosis present

## 2020-11-26 DIAGNOSIS — T405X2A Poisoning by cocaine, intentional self-harm, initial encounter: Secondary | ICD-10-CM | POA: Diagnosis not present

## 2020-11-26 DIAGNOSIS — F3160 Bipolar disorder, current episode mixed, unspecified: Secondary | ICD-10-CM | POA: Diagnosis not present

## 2020-11-26 DIAGNOSIS — F315 Bipolar disorder, current episode depressed, severe, with psychotic features: Secondary | ICD-10-CM | POA: Diagnosis present

## 2020-11-26 DIAGNOSIS — Z833 Family history of diabetes mellitus: Secondary | ICD-10-CM | POA: Diagnosis not present

## 2020-11-26 DIAGNOSIS — Z8249 Family history of ischemic heart disease and other diseases of the circulatory system: Secondary | ICD-10-CM | POA: Diagnosis not present

## 2020-11-26 DIAGNOSIS — M4802 Spinal stenosis, cervical region: Secondary | ICD-10-CM | POA: Diagnosis present

## 2020-11-26 DIAGNOSIS — G928 Other toxic encephalopathy: Secondary | ICD-10-CM | POA: Diagnosis present

## 2020-11-26 DIAGNOSIS — Z23 Encounter for immunization: Secondary | ICD-10-CM | POA: Diagnosis not present

## 2020-11-26 DIAGNOSIS — K219 Gastro-esophageal reflux disease without esophagitis: Secondary | ICD-10-CM | POA: Diagnosis present

## 2020-11-26 DIAGNOSIS — Z7982 Long term (current) use of aspirin: Secondary | ICD-10-CM | POA: Diagnosis not present

## 2020-11-26 DIAGNOSIS — W19XXXA Unspecified fall, initial encounter: Secondary | ICD-10-CM | POA: Diagnosis present

## 2020-11-26 DIAGNOSIS — J9601 Acute respiratory failure with hypoxia: Secondary | ICD-10-CM | POA: Diagnosis present

## 2020-11-26 DIAGNOSIS — Z20822 Contact with and (suspected) exposure to covid-19: Secondary | ICD-10-CM | POA: Diagnosis present

## 2020-11-26 DIAGNOSIS — R296 Repeated falls: Secondary | ICD-10-CM | POA: Diagnosis present

## 2020-11-26 LAB — HEMOGLOBIN A1C
Hgb A1c MFr Bld: 5.8 % — ABNORMAL HIGH (ref 4.8–5.6)
Mean Plasma Glucose: 120 mg/dL

## 2020-11-26 LAB — URINE DRUG SCREEN, QUALITATIVE (ARMC ONLY)
Amphetamines, Ur Screen: NOT DETECTED
Amphetamines, Ur Screen: NOT DETECTED
Barbiturates, Ur Screen: NOT DETECTED
Barbiturates, Ur Screen: NOT DETECTED
Benzodiazepine, Ur Scrn: POSITIVE — AB
Benzodiazepine, Ur Scrn: POSITIVE — AB
Cannabinoid 50 Ng, Ur ~~LOC~~: POSITIVE — AB
Cannabinoid 50 Ng, Ur ~~LOC~~: POSITIVE — AB
Cocaine Metabolite,Ur ~~LOC~~: NOT DETECTED
Cocaine Metabolite,Ur ~~LOC~~: NOT DETECTED
MDMA (Ecstasy)Ur Screen: NOT DETECTED
MDMA (Ecstasy)Ur Screen: NOT DETECTED
Methadone Scn, Ur: NOT DETECTED
Methadone Scn, Ur: NOT DETECTED
Opiate, Ur Screen: NOT DETECTED
Opiate, Ur Screen: NOT DETECTED
Phencyclidine (PCP) Ur S: NOT DETECTED
Phencyclidine (PCP) Ur S: NOT DETECTED
Tricyclic, Ur Screen: NOT DETECTED
Tricyclic, Ur Screen: NOT DETECTED

## 2020-11-26 LAB — TSH: TSH: 3.259 u[IU]/mL (ref 0.350–4.500)

## 2020-11-26 LAB — APTT: aPTT: 28 seconds (ref 24–36)

## 2020-11-26 LAB — BLOOD GAS, ARTERIAL
Acid-Base Excess: 0.3 mmol/L (ref 0.0–2.0)
Bicarbonate: 25.4 mmol/L (ref 20.0–28.0)
FIO2: 40
MECHVT: 640 mL
O2 Saturation: 98.9 %
PEEP: 5 cmH2O
Patient temperature: 37
RATE: 20 resp/min
pCO2 arterial: 42 mmHg (ref 32.0–48.0)
pH, Arterial: 7.39 (ref 7.350–7.450)
pO2, Arterial: 127 mmHg — ABNORMAL HIGH (ref 83.0–108.0)

## 2020-11-26 LAB — COMPREHENSIVE METABOLIC PANEL
ALT: 29 U/L (ref 0–44)
AST: 67 U/L — ABNORMAL HIGH (ref 15–41)
Albumin: 4.5 g/dL (ref 3.5–5.0)
Alkaline Phosphatase: 49 U/L (ref 38–126)
Anion gap: 20 — ABNORMAL HIGH (ref 5–15)
BUN: <5 mg/dL — ABNORMAL LOW (ref 6–20)
CO2: 16 mmol/L — ABNORMAL LOW (ref 22–32)
Calcium: 9.1 mg/dL (ref 8.9–10.3)
Chloride: 96 mmol/L — ABNORMAL LOW (ref 98–111)
Creatinine, Ser: 0.96 mg/dL (ref 0.61–1.24)
GFR, Estimated: >60 mL/min (ref >60)
Glucose, Bld: 121 mg/dL — ABNORMAL HIGH (ref 70–99)
Potassium: 3.2 mmol/L — ABNORMAL LOW (ref 3.5–5.1)
Sodium: 132 mmol/L — ABNORMAL LOW (ref 135–145)
Total Bilirubin: 0.9 mg/dL (ref 0.3–1.2)
Total Protein: 7.7 g/dL (ref 6.5–8.1)

## 2020-11-26 LAB — CBC
HCT: 38 % — ABNORMAL LOW (ref 39.0–52.0)
HCT: 39.1 % (ref 39.0–52.0)
Hemoglobin: 13.7 g/dL (ref 13.0–17.0)
Hemoglobin: 14 g/dL (ref 13.0–17.0)
MCH: 31.1 pg (ref 26.0–34.0)
MCH: 32.2 pg (ref 26.0–34.0)
MCHC: 36.1 g/dL — ABNORMAL HIGH (ref 30.0–36.0)
MCHC: 35.8 g/dL (ref 30.0–36.0)
MCV: 86.2 fL (ref 80.0–100.0)
MCV: 89.9 fL (ref 80.0–100.0)
Platelets: 193 10*3/uL (ref 150–400)
Platelets: 169 10*3/uL (ref 150–400)
RBC: 4.41 MIL/uL (ref 4.22–5.81)
RBC: 4.35 MIL/uL (ref 4.22–5.81)
RDW: 13 % (ref 11.5–15.5)
RDW: 13.7 % (ref 11.5–15.5)
WBC: 9.9 10*3/uL (ref 4.0–10.5)
WBC: 7.5 10*3/uL (ref 4.0–10.5)
nRBC: 0 % (ref 0.0–0.2)
nRBC: 0 % (ref 0.0–0.2)

## 2020-11-26 LAB — BASIC METABOLIC PANEL
Anion gap: 8 (ref 5–15)
Anion gap: 6 (ref 5–15)
BUN: <5 mg/dL — ABNORMAL LOW (ref 6–20)
BUN: <5 mg/dL — ABNORMAL LOW (ref 6–20)
CO2: 25 mmol/L (ref 22–32)
CO2: 25 mmol/L (ref 22–32)
Calcium: 8.4 mg/dL — ABNORMAL LOW (ref 8.9–10.3)
Calcium: 8.4 mg/dL — ABNORMAL LOW (ref 8.9–10.3)
Chloride: 96 mmol/L — ABNORMAL LOW (ref 98–111)
Chloride: 108 mmol/L (ref 98–111)
Creatinine, Ser: 0.75 mg/dL (ref 0.61–1.24)
Creatinine, Ser: 0.82 mg/dL (ref 0.61–1.24)
GFR, Estimated: >60 mL/min (ref >60)
GFR, Estimated: >60 mL/min (ref >60)
Glucose, Bld: 103 mg/dL — ABNORMAL HIGH (ref 70–99)
Glucose, Bld: 78 mg/dL (ref 70–99)
Potassium: 3.4 mmol/L — ABNORMAL LOW (ref 3.5–5.1)
Potassium: 2.8 mmol/L — ABNORMAL LOW (ref 3.5–5.1)
Sodium: 129 mmol/L — ABNORMAL LOW (ref 135–145)
Sodium: 139 mmol/L (ref 135–145)

## 2020-11-26 LAB — CK
Total CK: 2004 U/L — ABNORMAL HIGH (ref 49–397)
Total CK: 901 U/L — ABNORMAL HIGH (ref 49–397)

## 2020-11-26 LAB — TROPONIN I (HIGH SENSITIVITY)
Troponin I (High Sensitivity): 13 ng/L (ref <18)
Troponin I (High Sensitivity): 7 ng/L (ref <18)

## 2020-11-26 LAB — GLUCOSE, CAPILLARY
Glucose-Capillary: 98 mg/dL (ref 70–99)
Glucose-Capillary: 83 mg/dL (ref 70–99)
Glucose-Capillary: 79 mg/dL (ref 70–99)
Glucose-Capillary: 77 mg/dL (ref 70–99)
Glucose-Capillary: 59 mg/dL — ABNORMAL LOW (ref 70–99)
Glucose-Capillary: 99 mg/dL (ref 70–99)
Glucose-Capillary: 83 mg/dL (ref 70–99)

## 2020-11-26 LAB — LIPID PANEL
Cholesterol: 120 mg/dL (ref 0–200)
HDL: 30 mg/dL — ABNORMAL LOW (ref >40)
LDL Cholesterol: 68 mg/dL (ref 0–99)
Total CHOL/HDL Ratio: 4 RATIO
Triglycerides: 112 mg/dL (ref <150)
VLDL: 22 mg/dL (ref 0–40)

## 2020-11-26 LAB — LACTIC ACID, PLASMA
Lactic Acid, Venous: 1.2 mmol/L (ref 0.5–1.9)
Lactic Acid, Venous: 1.4 mmol/L (ref 0.5–1.9)
Lactic Acid, Venous: 2.3 mmol/L (ref 0.5–1.9)

## 2020-11-26 LAB — MAGNESIUM
Magnesium: 2.2 mg/dL (ref 1.7–2.4)
Magnesium: 1.9 mg/dL (ref 1.7–2.4)
Magnesium: 2.1 mg/dL (ref 1.7–2.4)

## 2020-11-26 LAB — MRSA NEXT GEN BY PCR, NASAL: MRSA by PCR Next Gen: NOT DETECTED

## 2020-11-26 LAB — ECHOCARDIOGRAM COMPLETE
AR max vel: 3.38 cm2
AV Area VTI: 3.04 cm2
AV Area mean vel: 2.97 cm2
AV Mean grad: 4 mmHg
AV Peak grad: 6.2 mmHg
Ao pk vel: 1.24 m/s
Area-P 1/2: 3.7 cm2
Height: 73 in
MV VTI: 3.14 cm2
S' Lateral: 2.86 cm
Weight: 3418.01 oz

## 2020-11-26 LAB — PROTIME-INR
INR: 1 (ref 0.8–1.2)
Prothrombin Time: 13.6 seconds (ref 11.4–15.2)

## 2020-11-26 LAB — PROCALCITONIN: Procalcitonin: <0.1 ng/mL

## 2020-11-26 LAB — HIV ANTIBODY (ROUTINE TESTING W REFLEX): HIV Screen 4th Generation wRfx: NONREACTIVE

## 2020-11-26 LAB — TRIGLYCERIDES: Triglycerides: 107 mg/dL (ref <150)

## 2020-11-26 LAB — PHOSPHORUS: Phosphorus: 3.6 mg/dL (ref 2.5–4.6)

## 2020-11-26 MED ORDER — DEXTROSE 50 % IV SOLN
INTRAVENOUS | Status: AC
  Administered 2020-11-26: 12.5 g via INTRAVENOUS
  Filled 2020-11-26: qty 50

## 2020-11-26 MED ORDER — ASPIRIN EC 325 MG PO TBEC: 325.0000 mg | DELAYED_RELEASE_TABLET | Freq: Every day | ORAL | Status: DC

## 2020-11-26 MED ORDER — PROSOURCE TF PO LIQD
45.0000 mL | Freq: Four times a day (QID) | ORAL | Status: DC
  Administered 2020-11-26 (×2): 45 mL
  Filled 2020-11-26 (×6): qty 45

## 2020-11-26 MED ORDER — STROKE: EARLY STAGES OF RECOVERY BOOK: Freq: Once | Status: AC

## 2020-11-26 MED ORDER — MIDAZOLAM HCL 2 MG/2ML IJ SOLN
2.0000 mg | INTRAMUSCULAR | Status: DC | PRN
Start: 2020-11-26 — End: 2020-11-27
  Administered 2020-11-26 (×2): 2 mg via INTRAVENOUS
  Filled 2020-11-26 (×2): qty 2

## 2020-11-26 MED ORDER — SODIUM CHLORIDE 0.9 % IV SOLN: INTRAVENOUS | Status: DC

## 2020-11-26 MED ORDER — DOCUSATE SODIUM 50 MG/5ML PO LIQD: 100.0000 mg | Freq: Two times a day (BID) | ORAL | Status: DC | PRN

## 2020-11-26 MED ORDER — IPRATROPIUM-ALBUTEROL 0.5-2.5 (3) MG/3ML IN SOLN: 3.0000 mL | Freq: Four times a day (QID) | RESPIRATORY_TRACT | Status: DC | PRN

## 2020-11-26 MED ORDER — PANTOPRAZOLE SODIUM 40 MG IV SOLR
40.0000 mg | Freq: Every day | INTRAVENOUS | Status: DC
  Administered 2020-11-26 (×2): 40 mg via INTRAVENOUS
  Filled 2020-11-26 (×2): qty 40

## 2020-11-26 MED ORDER — FENTANYL CITRATE PF 50 MCG/ML IJ SOSY
100.0000 ug | PREFILLED_SYRINGE | INTRAMUSCULAR | Status: DC | PRN
  Administered 2020-11-26: 100 ug via INTRAVENOUS
  Filled 2020-11-26: qty 2

## 2020-11-26 MED ORDER — MIDAZOLAM HCL 2 MG/2ML IJ SOLN
2.0000 mg | INTRAMUSCULAR | Status: DC | PRN
  Administered 2020-11-26: 2 mg via INTRAVENOUS

## 2020-11-26 MED ORDER — MIDAZOLAM-SODIUM CHLORIDE 100-0.9 MG/100ML-% IV SOLN
0.5000 mg/h | INTRAVENOUS | Status: DC
  Administered 2020-11-26: 2 mg/h via INTRAVENOUS
  Administered 2020-11-26: 1 mg/h via INTRAVENOUS
  Administered 2020-11-27: 2 mg/h via INTRAVENOUS
  Filled 2020-11-26 (×3): qty 100

## 2020-11-26 MED ORDER — LEVETIRACETAM IN NACL 500 MG/100ML IV SOLN
500.0000 mg | Freq: Two times a day (BID) | INTRAVENOUS | Status: DC
  Administered 2020-11-26 – 2020-11-28 (×5): 500 mg via INTRAVENOUS
  Filled 2020-11-26 (×5): qty 100

## 2020-11-26 MED ORDER — DOCUSATE SODIUM 50 MG/5ML PO LIQD
100.0000 mg | Freq: Two times a day (BID) | ORAL | Status: DC
  Administered 2020-11-26 – 2020-11-27 (×3): 100 mg
  Filled 2020-11-26 (×3): qty 10

## 2020-11-26 MED ORDER — BUDESONIDE 0.25 MG/2ML IN SUSP
0.2500 mg | Freq: Two times a day (BID) | RESPIRATORY_TRACT | Status: DC
  Administered 2020-11-26 – 2020-11-30 (×9): 0.25 mg via RESPIRATORY_TRACT
  Filled 2020-11-26 (×9): qty 2

## 2020-11-26 MED ORDER — POTASSIUM CHLORIDE 20 MEQ PO PACK
40.0000 meq | PACK | Freq: Two times a day (BID) | ORAL | Status: DC
  Administered 2020-11-26 – 2020-11-27 (×2): 40 meq
  Filled 2020-11-26 (×2): qty 2

## 2020-11-26 MED ORDER — SODIUM CHLORIDE 0.9 % IV BOLUS
1000.0000 mL | Freq: Once | INTRAVENOUS | Status: AC
  Administered 2020-11-26: 1000 mL via INTRAVENOUS

## 2020-11-26 MED ORDER — POTASSIUM CHLORIDE 10 MEQ/100ML IV SOLN
10.0000 meq | INTRAVENOUS | Status: AC
  Administered 2020-11-26 (×3): 10 meq via INTRAVENOUS
  Filled 2020-11-26 (×3): qty 100

## 2020-11-26 MED ORDER — FREE WATER
30.0000 mL | Status: DC
  Administered 2020-11-26 – 2020-11-27 (×4): 30 mL

## 2020-11-26 MED ORDER — ASPIRIN 325 MG PO TABS
325.0000 mg | ORAL_TABLET | Freq: Every day | ORAL | Status: DC
  Administered 2020-11-26 – 2020-11-28 (×3): 325 mg
  Filled 2020-11-26 (×3): qty 1

## 2020-11-26 MED ORDER — POTASSIUM CHLORIDE 10 MEQ/100ML IV SOLN
10.0000 meq | Freq: Once | INTRAVENOUS | Status: AC
  Administered 2020-11-26: 10 meq via INTRAVENOUS
  Filled 2020-11-26: qty 100

## 2020-11-26 MED ORDER — CHLORHEXIDINE GLUCONATE CLOTH 2 % EX PADS
6.0000 | MEDICATED_PAD | Freq: Every day | CUTANEOUS | Status: DC
  Administered 2020-11-26 – 2020-11-30 (×5): 6 via TOPICAL

## 2020-11-26 MED ORDER — POTASSIUM CHLORIDE 10 MEQ/100ML IV SOLN
10.0000 meq | INTRAVENOUS | Status: AC
Start: 2020-11-26 — End: 2020-11-27
  Administered 2020-11-26 – 2020-11-27 (×2): 10 meq via INTRAVENOUS
  Filled 2020-11-26 (×2): qty 100

## 2020-11-26 MED ORDER — FENTANYL 2500MCG IN NS 250ML (10MCG/ML) PREMIX INFUSION
50.0000 ug/h | INTRAVENOUS | Status: DC
  Administered 2020-11-26 (×2): 50 ug/h via INTRAVENOUS
  Filled 2020-11-26 (×2): qty 250

## 2020-11-26 MED ORDER — POLYETHYLENE GLYCOL 3350 17 G PO PACK
17.0000 g | PACK | Freq: Every day | ORAL | Status: DC
  Administered 2020-11-26 – 2020-11-27 (×2): 17 g
  Filled 2020-11-26 (×2): qty 1

## 2020-11-26 MED ORDER — VITAL AF 1.2 CAL PO LIQD
1000.0000 mL | ORAL | Status: DC
  Administered 2020-11-26: 1000 mL
  Administered 2020-11-26: 45 mL/h

## 2020-11-26 MED ORDER — POLYETHYLENE GLYCOL 3350 17 G PO PACK: 17.0000 g | PACK | Freq: Every day | ORAL | Status: DC | PRN

## 2020-11-26 MED ORDER — DEXTROSE-NACL 5-0.45 % IV SOLN: INTRAVENOUS | Status: DC

## 2020-11-26 MED ORDER — FENTANYL CITRATE PF 50 MCG/ML IJ SOSY
100.0000 ug | PREFILLED_SYRINGE | INTRAMUSCULAR | Status: DC | PRN
Start: 2020-11-26 — End: 2020-11-27

## 2020-11-26 MED ORDER — ADULT MULTIVITAMIN LIQUID CH
15.0000 mL | Freq: Every day | ORAL | Status: DC
  Administered 2020-11-27: 15 mL
  Filled 2020-11-26: qty 15

## 2020-11-26 MED ORDER — DEXTROSE 50 % IV SOLN: 12.5000 g | INTRAVENOUS | Status: AC

## 2020-11-26 NOTE — Consult Note (Signed)
PHARMACY CONSULT NOTE  Pharmacy Consult for Electrolyte Monitoring and Replacement   Recent Labs: Potassium (mmol/L)  Date Value  11/26/2020 3.4 (L)  06/08/2014 3.7   Magnesium (mg/dL)  Date Value  56/31/4970 1.9   Calcium (mg/dL)  Date Value  26/37/8588 8.4 (L)   Calcium, Total (mg/dL)  Date Value  50/27/7412 9.3   Albumin (g/dL)  Date Value  87/86/7672 4.5  06/05/2014 4.5   Phosphorus (mg/dL)  Date Value  09/47/0962 3.6   Sodium (mmol/L)  Date Value  11/26/2020 129 (L)  06/05/2014 135   Assessment: Patient is a 50 y/o M with medical history including bipolar disorder, polysubstance abuse, chronic pain associated with psychosocial dysfunction, lumbar canal stenosis, HLD, schizophrenia, CVA, HTN, COPD is admitted with seizure like activity. Patient was ultimately intubated and remains sedated on mechanical ventilation in the ICU. Pharmacy consulted to assist with electrolyte monitoring and replacement as indicated.  Nutrition: Plan is to initiate tube feeds today MIVF: NS at 125 mL/hr  Goal of Therapy:  Electrolytes within normal limits  Plan:  --Na 132 >> 129, on MIVF as above --K 3.2 >> 3.4, received IV Kcl 10 mEq x 4 runs --Will follow-up electrolytes with AM labs tomorrow  Tressie Ellis 11/26/2020 11:30 AM

## 2020-11-26 NOTE — Consult Note (Signed)
Neurology Consultation Reason for Consult: Seizures Referring Physician: Belia Heman, K  CC: Seizures  History is obtained from: Chart review  HPI: Scott Howell is a 50 y.o. male with no known previous history of seizures who presents with altered mental status after seizure.  He apparently was complaining of some shoulder pain, and that is why he called EMS, but then had a seizure during transport.  He was violently thrashing and not following commands on arrival and for that reason was intubated.  His situation on arrival could be consistent with a postictal state.  He does have a history of substance abuse, but UDS was negative except for THC and benzos (the latter explained iatrogenically).   No further seizures overnight, he was reportedly following commands and moving all extremities spontaneously, but was getting agitated and therefore was sedated more aggressively shortly before I arrived.      ROS: Unable to obtain due to altered mental status.   Past Medical History:  Diagnosis Date   Bipolar 1 disorder (HCC)    COPD (chronic obstructive pulmonary disease) (HCC)    GERD (gastroesophageal reflux disease)    High cholesterol    Hypertension    Schizophrenia (HCC)    Stroke (HCC)      Family History  Problem Relation Age of Onset   Diabetes Mother    Hyperlipidemia Mother    Hypertension Mother    Diabetes Maternal Aunt    Cancer Maternal Aunt    Hyperlipidemia Maternal Aunt    Hypertension Maternal Aunt    Diabetes Maternal Uncle    Cancer Maternal Uncle    Hyperlipidemia Maternal Uncle    Hypertension Maternal Uncle      Social History:  reports that he has been smoking cigarettes. He has never used smokeless tobacco. He reports that he does not drink alcohol and does not use drugs.   Exam: Current vital signs: BP 115/78   Pulse 69   Temp (!) 97.4 F (36.3 C) (Axillary)   Resp 20   Ht 6\' 1"  (1.854 m)   Wt 96.9 kg   SpO2 99%   BMI 28.18 kg/m  Vital  signs in last 24 hours: Temp:  [96.9 F (36.1 C)-97.7 F (36.5 C)] 97.4 F (36.3 C) (10/13 1143) Pulse Rate:  [65-169] 69 (10/13 1130) Resp:  [0-50] 20 (10/13 1130) BP: (96-204)/(61-101) 115/78 (10/13 1130) SpO2:  [71 %-100 %] 99 % (10/13 1130) FiO2 (%):  [30 %-100 %] 30 % (10/13 1118) Weight:  [96.9 kg-107 kg] 96.9 kg (10/13 0500)   Physical Exam  Constitutional: Appears well-developed and well-nourished.  Psych: Affect appropriate to situation Eyes: No scleral injection HENT: ET tube in place MSK: no joint deformities.  Cardiovascular: Normal rate and regular rhythm.  Respiratory: Effort normal, non-labored breathing GI: Soft.  No distension. There is no tenderness.  Skin: WDI  Neuro: Mental Status: Patient is obtunded, he does not follow commands. Cranial Nerves: II: Does not blink to threat. Pupils are equal, round, and reactive to light.   III,IV, VI: Eyes are slightly disconjugate, suspect a mild underlying exophoria V:VII: Blinks to eyelid stimulation bilaterally Motor: Withdraws to noxious stimulation in all four extremities  sensory: As above  Deep Tendon Reflexes: 2+ and symmetric in the biceps and patellae.  Cerebellar: Does not perform   I have reviewed labs in epic and the results pertinent to this consultation are: UDS positive for cannabinoids and benzos CK 2000 Procalcitonin is negative  I have reviewed the images  obtained: CT head-there is a hypodensity was cerebellum, but this I do not think is definite acute infarct though that is possible, MRI would better characterize  Impression: 50 year old male with new onset seizures of unclear etiology.  The fact that he is following commands is very reassuring, but unfortunately he had been sedated shortly before my arrival.  I would favor continuing Keppra for now, though with further history once he is extubated we can decide on possible provoking mechanisms.  Recommendations: 1) repeat UDS 2) EEG 3)  MRI brain 4) continue Keppra 500 mg twice daily 5) ASA 325 mg daily pending further characterization of the CT finding 6) neurology will continue to follow   Ritta Slot, MD [facility] [phone]  If 7pm- 7am, please page neurology on call as listed in [facility]ION.

## 2020-11-26 NOTE — Plan of Care (Signed)

## 2020-11-26 NOTE — Progress Notes (Signed)
SLP Cancellation Note  Patient Details Name: Scott Howell MRN: [id number] DOB: 12/22/1970   Cancelled treatment:        Chart reviewed, Pt sedated and intubated. ST to follow and assess as appropriate.   Eather Colas 11/26/2020, 1:22 PM

## 2020-11-26 NOTE — Plan of Care (Signed)
Unable to review care plan with pt , pt is intubated and sedated.  No family available.

## 2020-11-26 NOTE — Procedures (Signed)
History: 50 year old male with a history of new onset seizure  Sedation: Propofol, fentanyl  Technique: This EEG was acquired with electrodes placed according to the International 10-20 electrode system (including Fp1, Fp2, F3, F4, C3, C4, P3, P4, O1, O2, T3, T4, T5, T6, A1, A2, Fz, Cz, Pz). The following electrodes were missing or displaced: none.   Background: The background consists of generally distributed 8-9 Hz alpha range activity with some brief 1 second attenuations.  There is also frontocentral predominant beta activity seen throughout recording.  Photic stimulation: Physiologic driving is not performed  EEG Abnormalities: Sedated EEG  Clinical Interpretation: This EEG is consistent with the patient's sedated state. There was no seizure or seizure predisposition recorded on this study. Please note that lack of epileptiform activity on EEG does not preclude the possibility of epilepsy.   Ritta Slot, MD [facility] [phone]  If 7pm- 7am, please page neurology on call as listed in [facility].

## 2020-11-26 NOTE — Progress Notes (Signed)
Initial Nutrition Assessment  DOCUMENTATION CODES:   Not applicable  INTERVENTION:   Initiate Vital 1.2 @45ml /hr + ProSource TF 16ml QID via tube   Propofol: 28.2 ml/hr- provides 744kcal/day   Free water flushes 21ml q4 hours to maintain tube patency   Regimen provides 2200kcal/day, 125g/day protein and 1064ml/day free water   Pt at high refeed risk; recommend monitor potassium, magnesium and phosphorus labs daily until stable  Liquid MVI via tube   NUTRITION DIAGNOSIS:   Inadequate oral intake related to inability to eat (pt sedated and ventilated) as evidenced by NPO status.  GOAL:   Provide needs based on ASPEN/SCCM guidelines  MONITOR:   Vent status, Labs, Weight trends, TF tolerance, Skin, I & O's  REASON FOR ASSESSMENT:   Ventilator    ASSESSMENT:   50 y/o male with h/o COPD, bipolar disorder, substance abuse, HTN, chronic pain and GERD who admitted with shoulder pain and seizure requiring intubation and ventilation. 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  Pt sedated and ventilated. OGT in place; confirmation radiograph pending. Suspect pt with decreased oral intake at baseline r/t substance abuse. There is no recent documented weight history in chart to confirm if any weight changes.   Medications reviewed and include: colace, protonix, miralax, NaCl @125ml /hr, propofol   Labs reviewed: Na 129(L), K 3.4(L), BUN <5(L), P 3.6 wnl, Mg 1.9 wnl  Patient is currently intubated on ventilator support MV: 12.6 L/min Temp (24hrs), Avg:97.3 F (36.3 C), Min:96.9 F (36.1 C), Max:97.7 F (36.5 C)  Propofol: 28.2 ml/hr- provides 744kcal/day   MAP- >45mmHg  UOP-   NUTRITION - FOCUSED PHYSICAL EXAM:  Flowsheet Row Most Recent Value  Orbital Region No depletion  Upper Arm Region Mild depletion  Thoracic and Lumbar Region No depletion  Buccal Region No depletion  Temple Region Mild depletion  Clavicle Bone Region No depletion  Clavicle and  Acromion Bone Region No depletion  Scapular Bone Region No depletion  Dorsal Hand No depletion  Patellar Region Moderate depletion  Anterior Thigh Region Mild depletion  Posterior Calf Region Mild depletion  Edema (RD Assessment) None  Hair Reviewed  Eyes Reviewed  Mouth Reviewed  Skin Reviewed  Nails Reviewed   Diet Order:   Diet Order             Diet NPO time specified  Diet effective now                  EDUCATION NEEDS:   No education needs have been identified at this time  Skin:  Skin Assessment: Reviewed RN Assessment  Last BM:  pta  Height:   Ht Readings from Last 1 Encounters:  11/25/20 6\' 1"  (1.854 m)    Weight:   Wt Readings from Last 1 Encounters:  11/26/20 96.9 kg    Ideal Body Weight:  83.6 kg  BMI:  Body mass index is 28.18 kg/m.  Estimated Nutritional Needs:   Kcal:  2085kcal/day  Protein:  125-145g/day  Fluid:  2.5-2.8L/day  MS, RD, LDN Please refer to [facility] for RD and/or RD on-call/weekend/after hours pager

## 2020-11-26 NOTE — Progress Notes (Signed)
PT Cancellation Note  Patient Details Name: Scott Howell MRN: [id number] DOB: 1970-08-11   Cancelled Treatment:    Reason Eval/Treat Not Completed: Patient not medically ready Spoke with nurse, pt still sedated and intubated, now appropriate for PT at this time.  Will maintain on caseload and continue to follow remotely.   Malachi Pro, DPT 11/26/2020, 11:21 AM

## 2020-11-26 NOTE — Progress Notes (Signed)
PHARMACY CONSULT NOTE - FOLLOW UP  Pharmacy Consult for Electrolyte Monitoring and Replacement   Recent Labs: Potassium (mmol/L)  Date Value  11/25/2020 3.2 (L)  06/08/2014 3.7   Magnesium (mg/dL)  Date Value  89/16/9450 2.2   Calcium (mg/dL)  Date Value  38/88/2800 9.1   Calcium, Total (mg/dL)  Date Value  34/91/7915 9.3   Albumin (g/dL)  Date Value  05/69/7948 4.5  06/05/2014 4.5   Sodium (mmol/L)  Date Value  11/25/2020 132 (L)  06/05/2014 135     Assessment: 10/12:  K @ 2259 = 3.2  Goal of Therapy:  Electrolytes WNL   Plan:  Will order KCl 10 mEq IV X 4 and recheck electrolytes on 10/13 with AM labs.   Scherrie Gerlach ,PharmD Clinical Pharmacist 11/26/2020 1:12 AM

## 2020-11-26 NOTE — Progress Notes (Signed)
*  PRELIMINARY RESULTS* Echocardiogram 2D Echocardiogram has been performed.  Joanette Gula  11/26/2020, 10:57 AM

## 2020-11-26 NOTE — Progress Notes (Signed)
eLink Physician-Brief Progress Note Patient Name: JUN OSMENT DOB: [birth year] MRN: [id number]   Date of Service  11/26/2020  HPI/Events of Note  70 M history of bipolar disorder, COPD, hypertension, dyslipidemia presented with shoulder pain. Enroute had a seizure and was violent post ictal. He was in acute distress on arrival to the ED and was subsequently intubated.  CT head shows possible subacute infarct in the left cerebral hemisphere without mass-effect hemorrhage or other acute intracranial process.  Metabolic acidosis on initial work up which has since resolved Tox screen positive for benzos which he did receive and cannabinoid  Seen intubated 20/640/40%/5 PEEP, MV 12, peak pressure 19 BP 105/77  HR 76  O2 99%  eICU Interventions  Altered sensorium, post ictal with note of cerebral infarct on head CT. MRI pending. Neurology consulted.     Intervention Category Evaluation Type: New Patient Evaluation  Darl Pikes 11/26/2020, 2:46 AM

## 2020-11-26 NOTE — Progress Notes (Signed)
OT Cancellation Note  Patient Details Name: Scott Howell MRN: [id number] DOB: 1970-10-30   Cancelled Treatment:    Reason Eval/Treat Not Completed: Patient not medically ready. Consult received, chart reviewed. Pt noted to be intubated and sedated currently. Will hold OT and re-attempt OT evaluation once pt is medically appropriate.   Arman Filter., MPH, MS, OTR/L ascom [phone] 11/26/20, 9:34 AM

## 2020-11-26 NOTE — Progress Notes (Signed)
NIH stroke scale limited due to patient being intubated and sedated.

## 2020-11-26 NOTE — H&P (Addendum)
NAME:  Scott Howell, MRN:  [id number], DOB:  May 03, 1970, LOS: 0 ADMISSION DATE:  11/25/2020, CONSULTATION DATE:  11/26/2020 REFERRING MD: Rochele Raring, MD CHIEF COMPLAINT:  Seizures   HPI  50 y.o with significant PMH of bipolar disorder, polysubstance abuse, chronic pain associated with significant psychosocial dysfunction, lumbar canal stenosis, hyperlipidemia, schizophrenia, CVA, hypertension, and COPD who presented to the ED with witnessed seizures like activity.  Per ED reports, EMS was apparently called out for shoulder and back pain.  On EMS arrival, patient suddenly began to posture followed by seizure-like activity lasting less than 30 seconds.  He was also noted to be bradycardic in the 30s that resolved prior to medication administration.  There were no reports of loss of  bladder function or tongue biting.  Patient was noted to be postictal and very difficult to redirect violently thrashing and attempting to get out of the stretcher.  ED Course: On arrival to the ED, he was afebrile with blood pressure 204/81 mm Hg and pulse rate 158 beats/min, RR 43 breaths per minute, oxygen saturation 100% on nonrebreather.  She was noted to be in acute distress, diaphoretic and toxic appearing.  He was agitated, thrashing about and very aggressive requiring multiple people to hold him down.  Patient was given 6 mg grams of Versed without any effect.  He was subsequently intubated for airway protection.   Labs/Diagnostics WBC/Hgb/Hct/Plts:  11.0/14.9/42.1/246 (10/12 2259)  Na+ 132, K+ 3.2, CO2 16, glucose 121, AST 67/ALT 29, anion gap 20 UA: No evidence of UTI UDS: Positive for marijuana Venous Blood Gas result:  pO2 232; pCO2 39; pH 7.17;  HCO3 14.2, %O2 Sat 99.7 Lactate: 2.3 CK: 2004 EKG: Sinus tachycardia with a ventricular rate of 151 bpm with ventricular bigeminy and multiple nonspecific changes. CXR: No evidence of focal consolidation, overt edema, pneumothorax, effusion or other acute  cardiopulmonary process.  PCCM consulted for admission to ICU and medical optimization. Past Medical History  bipolar disorder polysubstance abuse chronic pain associated with significant psychosocial dysfunction lumbar canal stenosis Hyperlipidemia Schizophrenia CVA Hypertension COPD  Significant Hospital Events   10/13: Admitted to ICU with seizure-like activity  Consults:  Neurology  Procedures:  10/13: Intubation  Significant Diagnostic Tests:  10/13: Chest Xray>No focal consolidation. No pulmonary edema. No pleural effusion. No pneumothorax 10/13: Noncontrast CT head>Possible subacute infarct within the left cerebellar hemisphere without associated mass effect or superimposed acute intracranial hemorrhage  Micro Data:  10/13: SARS-CoV-2 PCR> negative 10/13: Influenza PCR> negative 10/13: Blood culture x2> 10/13: Urine Culture> 10/13: MRSA PCR>>   Antimicrobials:  None OBJECTIVE  Blood pressure 102/64, pulse 82, temperature (!) 97 F (36.1 C), resp. rate (!) 23, height 6\' 1"  (1.854 m), weight 107 kg, SpO2 100 %.    Vent Mode: AC FiO2 (%):  [40 %-100 %] 40 % Set Rate:  [18 bmp] 18 bmp Vt Set:  [450 mL] 450 mL   Intake/Output Summary (Last 24 hours) at 11/26/2020 0047 Last data filed at 11/26/2020 0013 Gross per 24 hour  Intake 1090.82 ml  Output --  Net 1090.82 ml   Filed Weights   11/25/20 2250 11/25/20 2303  Weight: 104.3 kg 107 kg   Physical Examination  GENERAL: 49 year-old critically ill patient lying in the bed intubated, mechanically ventilated and sedated EYES: Pupils equal, round, reactive to light and accommodation. No scleral icterus. Extraocular muscles intact.  HEENT: Head atraumatic, normocephalic. Oropharynx and nasopharynx clear.  NECK:  Supple, no jugular venous distention. No thyroid  enlargement, no tenderness.  LUNGS: Normal breath sounds bilaterally, no wheezing, rales,rhonchi or crepitation. No use of accessory muscles of  respiration.  CARDIOVASCULAR: S1, S2 normal. No murmurs, rubs, or gallops.  ABDOMEN: Soft, nontender, nondistended. Bowel sounds present. No organomegaly or mass.  EXTREMITIES: No pedal edema, cyanosis, or clubbing.  NEUROLOGIC: Cranial nerves II through XII are intact.  Moves all extremities when sedation held. Sensation intact. Gait not checked.  PSYCHIATRIC: The patient is intubated and sedated SKIN: No obvious rash, lesion, or ulcer.   Labs/imaging that I havepersonally reviewed  (right click and "Reselect all SmartList Selections" daily)     Labs   CBC: Recent Labs  Lab 11/25/20 2259  WBC 11.0*  NEUTROABS 6.6  HGB 14.9  HCT 42.1  MCV 89.4  PLT 246    Basic Metabolic Panel: Recent Labs  Lab 11/25/20 2259  NA 132*  K 3.2*  CL 96*  CO2 16*  GLUCOSE 121*  BUN <5*  CREATININE 0.96  CALCIUM 9.1  MG 2.2   GFR: Estimated Creatinine Clearance: 118.1 mL/min (by C-G formula based on SCr of 0.96 mg/dL). Recent Labs  Lab 11/25/20 2259 11/26/20 0013  WBC 11.0*  --   LATICACIDVEN  --  2.3*    Liver Function Tests: Recent Labs  Lab 11/25/20 2259  AST 67*  ALT 29  ALKPHOS 49  BILITOT 0.9  PROT 7.7  ALBUMIN 4.5   No results for input(s): LIPASE, AMYLASE in the last 168 hours. No results for input(s): AMMONIA in the last 168 hours.  ABG    Component Value Date/Time   HCO3 14.2 (L) 11/25/2020 2300   ACIDBASEDEF 13.7 (H) 11/25/2020 2300   O2SAT 99.7 11/25/2020 2300     Coagulation Profile: Recent Labs  Lab 11/25/20 2259  INR 1.1    Cardiac Enzymes: No results for input(s): CKTOTAL, CKMB, CKMBINDEX, TROPONINI in the last 168 hours.  HbA1C: No results found for: HGBA1C  CBG: No results for input(s): GLUCAP in the last 168 hours.  Review of Systems:   Unable to obtain due to patient being intubated, mechanically ventilated and sedated  Past Medical History  He,  has a past medical history of Bipolar 1 disorder (HCC), COPD (chronic obstructive  pulmonary disease) (HCC), GERD (gastroesophageal reflux disease), High cholesterol, Hypertension, Schizophrenia (HCC), and Stroke (HCC).   Surgical History    Past Surgical History:  Procedure Laterality Date   CERVICAL SPINE SURGERY     4   HARDWARE REMOVAL Left 04/20/2015   Procedure: HARDWARE REMOVAL left leg;  Surgeon: Deeann Saint, MD;  Location: [facility] ORS;  Service: Orthopedics;  Laterality: Left;   KNEE SURGERY Left    SHOULDER SURGERY Left      Social History   reports that he has been smoking cigarettes. He has never used smokeless tobacco. He reports that he does not drink alcohol and does not use drugs.   Family History   His family history includes Cancer in his maternal aunt and maternal uncle; Diabetes in his maternal aunt, maternal uncle, and mother; Hyperlipidemia in his maternal aunt, maternal uncle, and mother; Hypertension in his maternal aunt, maternal uncle, and mother.   Allergies Allergies  Allergen Reactions   Acetaminophen Other (See Comments)    Pt states that it makes him hyper. Pt states he takes tylenol when needed.      Home Medications  Prior to Admission medications   Medication Sig Start Date End Date Taking? Authorizing Provider  amLODipine (NORVASC) 5 MG  tablet TAKE 1 TABLET BY MOUTH DAILY. Patient not taking: Reported on 11/25/2020 04/12/16   Kerman Passey, MD  aspirin 81 MG tablet Take 81 mg by mouth. Patient not taking: Reported on 11/25/2020 06/17/10   [provider]  atorvastatin (LIPITOR) 20 MG tablet TAKE 1 TABLET BY MOUTH AT BEDTIME. Patient not taking: No sig reported 07/20/16   Kerman Passey, MD  diazepam (VALIUM) 10 MG tablet Take 5 mg by mouth 4 (four) times daily as needed.  Patient not taking: Reported on 11/25/2020 07/11/13   [provider]  Diclofenac Sodium 3 % GEL Place 1 application every 12 (twelve) hours as needed onto the skin. Patient not taking: Reported on 11/25/2020 12/24/16   Myrna Blazer,  MD  doxycycline (VIBRAMYCIN) 100 MG capsule Take 1 capsule (100 mg total) by mouth 2 (two) times daily. Patient not taking: Reported on 11/25/2020 12/08/16   Myrna Blazer, MD  DULoxetine (CYMBALTA) 60 MG capsule Take 60 mg by mouth 2 (two) times daily.  Patient not taking: Reported on 11/25/2020    [provider]  fluticasone (FLOVENT HFA) 110 MCG/ACT inhaler Inhale 2 puffs into the lungs 2 (two) times daily. Patient not taking: Reported on 11/25/2020 05/17/16   Kerman Passey, MD  levothyroxine (SYNTHROID, LEVOTHROID) 50 MCG tablet TAKE 1 TABLET BY MOUTH DAILY. Patient not taking: No sig reported 07/20/16   Kerman Passey, MD  meloxicam (MOBIC) 15 MG tablet Take 15 mg by mouth daily. Patient not taking: Reported on 11/25/2020    [provider]  omeprazole (PRILOSEC) 20 MG capsule Take 1 capsule (20 mg total) by mouth daily. Patient not taking: Reported on 11/25/2020 01/05/16   Kerman Passey, MD  potassium chloride (KLOR-CON 10) 10 MEQ tablet One by mouth twice a day for three days, then one a day until finished Patient not taking: No sig reported 01/06/16   Kerman Passey, MD  predniSONE (DELTASONE) 50 MG tablet Take 1 tab PO Daily Patient not taking: Reported on 11/25/2020 12/08/16   Myrna Blazer, MD  PROVENTIL HFA 108 [phone] Base) MCG/ACT inhaler INHALE 2 PUFFS INTO THE LUNGS EVERY FOUR HOURS AS NEEDED FOR WHEEZING OR SHORTNESS OF BREATH Patient not taking: Reported on 11/25/2020 01/04/17   Kerman Passey, MD  QUEtiapine (SEROQUEL) 100 MG tablet Take 100-200 mg by mouth 4 (four) times daily. Pt takes one tablet three times a day and two tablets at bedtime. Patient not taking: Reported on 11/25/2020    [provider]  ranitidine (ZANTAC) 150 MG tablet TAKE 1 TABLET BY MOUTH 2 TIMES DAILY FOR HEARTBURN ,REFLUX Patient not taking: Reported on 11/25/2020 09/15/16   Kerman Passey, MD  Scheduled Meds:   stroke: mapping our early stages of  recovery book   Does not apply Once   docusate  100 mg Per Tube BID   pantoprazole (PROTONIX) IV  40 mg Intravenous QHS   polyethylene glycol  17 g Per Tube Daily   Continuous Infusions:  fentaNYL infusion INTRAVENOUS     levETIRAcetam     potassium chloride     propofol (DIPRIVAN) infusion 30 mcg/kg/min (11/26/20 0057)   PRN Meds:.docusate sodium, fentaNYL (SUBLIMAZE) injection, fentaNYL (SUBLIMAZE) injection, midazolam, midazolam, polyethylene glycol  Active Hospital Problem list   Acute hypoxic respiratory failure Seizures without known seizure disorder Acute CVA AGMA Rhabdomyolysis Acute metabolic encephalopathy Polysubstance abuse  Assessment & Plan:  Acute Hypoxic Respiratory Failure in the setting of Seizure Activity and  Acute Metabolic Encephalopathy PMHx: COPD - Ventilator settings: PRVC  8 mL/kg, 60% FiO2, 5 PEEP, continue ventilator support & lung protective strategies - Wean PEEP & FiO2 as tolerated, maintain SpO2 > 90% - Head of bed elevated 30 degrees, VAP protocol in place - Plateau pressures less than 30 cm H20  - Intermittent chest x-ray & ABG PRN - Daily WUA with SBT as tolerated  - PAD protocol in place: continue Fentanyl drip & Propofol drip - Budesonide inhaler nebs BID, bronchodilators PRN  Seizure without known Seizure Disorder Likely provoked? - EEG pending - Seizure precautions - Continue Versed and propofol gtt for sedation - Patient loaded with 1 g Keppra in the ED.  Continue maintenance dose 500 mg twice daily pending neurology recommendations - MRI brain pending - Neurology consult  Acute CVA : CT head shows subacute left cerebellar stroke PMHx: CVA/TIA - Check HgbA1c, fasting lipid panel - Obtain MRI  of the brain without contrast - PT consult, OT consult, Speech consult - Echocardiogram - Patient was not on any antiplatelet or anticoagulation prior to this event.  - NPO until RN stroke swallow screen - Frequent neuro checks -  Neurology consult as above  Acute Metabolic Encephalopathy in the setting of Seizures, Substance abuse and Rhabdomyolysis, CVA PMHx: Polysubstance abuse, Bipolar disorder, Paranoid Schizophrenia - CT head shows possible subacute infarct within the left cerebellar hemisphere - UDS + Marijuana - CBC, CMP, VBG/Lactate - Acetaminophen, Salicylate, EtOH / Osmolar Gap / Volatile Screen levels - EKG (check prolonged QRS, QT) - check volatiles, serum osm/ urine osm - Hold psych meds for now - Will need sitter and psych consult after acute illness/ extubation  Rhabdomyolysis  CK>2000 Anion gap metabolic acidosis - Start IV fluids with goal urine output 2 to 3 cc/kg/h - Trend volume status, chemistry, CK, lactate and LFTs - Monitor serum bicarb/ABG/VBG - Avoid nephrotoxin  Best practice:  Diet:  NPO Pain/Anxiety/Delirium protocol (if indicated): Yes (RASS goal -1) VAP protocol (if indicated): Yes DVT prophylaxis: SCD GI prophylaxis: PPI Glucose control:  SSI No Central venous access:  N/A Arterial line:  N/A Foley:  Yes, and it is still needed Mobility:  bed rest  PT consulted: N/A Last date of multidisciplinary goals of care discussion [10/13] Code Status:  full code Disposition: ICU   = Goals of Care = Code Status Order: FULL  Primary Emergency Contact: Vinciguerra,Katherine, Home Phone: [phone] Wishes to pursue full aggressive treatment and intervention options, including CPR and intubation, but goals of care will be addressed on going with family if that should become necessary.  Critical care time: 45 minutes     Webb Silversmith, DNP, CCRN, FNP-C, AGACNP-BC Acute Care Nurse Practitioner  [facility] Pager: [phone] [facility]  .

## 2020-11-26 NOTE — Progress Notes (Signed)
Eeg done 

## 2020-11-26 NOTE — Progress Notes (Signed)
RT assisted with patient transport to MRI and back while patient on the Trilogy transport vent. No complications arose during trip.

## 2020-11-26 NOTE — Progress Notes (Signed)
Patient hypothermic, 95.5. Esophageal temp probe inserted and warm blankets applied. BG 59. 1/2amp D50 given, recheck 99. NP made aware. Will continue to monitor.

## 2020-11-26 NOTE — Progress Notes (Signed)
NP made aware of lab results, Hypokalemia at 2.8. Resolved hyponatremia, 139 and low normal BG 78. Awaiting further orders.

## 2020-11-27 ENCOUNTER — Inpatient Hospital Stay: Payer: Medicaid Other

## 2020-11-27 DIAGNOSIS — F3160 Bipolar disorder, current episode mixed, unspecified: Secondary | ICD-10-CM

## 2020-11-27 DIAGNOSIS — R569 Unspecified convulsions: Principal | ICD-10-CM

## 2020-11-27 DIAGNOSIS — R4182 Altered mental status, unspecified: Secondary | ICD-10-CM | POA: Diagnosis not present

## 2020-11-27 LAB — BASIC METABOLIC PANEL
Anion gap: 6 (ref 5–15)
BUN: <5 mg/dL — ABNORMAL LOW (ref 6–20)
CO2: 25 mmol/L (ref 22–32)
Calcium: 8.3 mg/dL — ABNORMAL LOW (ref 8.9–10.3)
Chloride: 108 mmol/L (ref 98–111)
Creatinine, Ser: 0.85 mg/dL (ref 0.61–1.24)
GFR, Estimated: >60 mL/min (ref >60)
Glucose, Bld: 96 mg/dL (ref 70–99)
Potassium: 3.7 mmol/L (ref 3.5–5.1)
Sodium: 139 mmol/L (ref 135–145)

## 2020-11-27 LAB — CBC
HCT: 39.5 % (ref 39.0–52.0)
Hemoglobin: 13.7 g/dL (ref 13.0–17.0)
MCH: 31.4 pg (ref 26.0–34.0)
MCHC: 34.7 g/dL (ref 30.0–36.0)
MCV: 90.6 fL (ref 80.0–100.0)
Platelets: 181 10*3/uL (ref 150–400)
RBC: 4.36 MIL/uL (ref 4.22–5.81)
RDW: 13.9 % (ref 11.5–15.5)
WBC: 8 10*3/uL (ref 4.0–10.5)
nRBC: 0 % (ref 0.0–0.2)

## 2020-11-27 LAB — GLUCOSE, CAPILLARY
Glucose-Capillary: 100 mg/dL — ABNORMAL HIGH (ref 70–99)
Glucose-Capillary: 96 mg/dL (ref 70–99)
Glucose-Capillary: 114 mg/dL — ABNORMAL HIGH (ref 70–99)
Glucose-Capillary: 119 mg/dL — ABNORMAL HIGH (ref 70–99)

## 2020-11-27 LAB — CK: Total CK: 604 U/L — ABNORMAL HIGH (ref 49–397)

## 2020-11-27 LAB — PHOSPHORUS: Phosphorus: 3.9 mg/dL (ref 2.5–4.6)

## 2020-11-27 LAB — MAGNESIUM: Magnesium: 2.1 mg/dL (ref 1.7–2.4)

## 2020-11-27 MED ORDER — ENOXAPARIN SODIUM 40 MG/0.4ML IJ SOSY
40.0000 mg | PREFILLED_SYRINGE | INTRAMUSCULAR | Status: DC
Start: 2020-11-27 — End: 2020-11-30
  Administered 2020-11-27 – 2020-11-29 (×3): 40 mg via SUBCUTANEOUS
  Filled 2020-11-27 (×3): qty 0.4

## 2020-11-27 MED ORDER — DIAZEPAM 5 MG/ML IJ SOLN
5.0000 mg | Freq: Four times a day (QID) | INTRAMUSCULAR | Status: DC | PRN
  Administered 2020-11-27 – 2020-11-30 (×3): 5 mg via INTRAVENOUS
  Filled 2020-11-27 (×3): qty 2

## 2020-11-27 MED ORDER — SODIUM CHLORIDE 0.9 % IV BOLUS
1000.0000 mL | Freq: Once | INTRAVENOUS | Status: AC
  Administered 2020-11-27: 1000 mL via INTRAVENOUS

## 2020-11-27 MED ORDER — DOCUSATE SODIUM 100 MG PO CAPS
200.0000 mg | ORAL_CAPSULE | Freq: Two times a day (BID) | ORAL | Status: DC
  Administered 2020-11-28 – 2020-11-30 (×5): 200 mg via ORAL
  Filled 2020-11-27 (×5): qty 2

## 2020-11-27 MED ORDER — POTASSIUM CHLORIDE 20 MEQ PO PACK
40.0000 meq | PACK | Freq: Two times a day (BID) | ORAL | Status: DC
  Administered 2020-11-27 – 2020-11-30 (×6): 40 meq via ORAL
  Filled 2020-11-27 (×6): qty 2

## 2020-11-27 MED ORDER — INFLUENZA VAC SPLIT QUAD 0.5 ML IM SUSY
0.5000 mL | PREFILLED_SYRINGE | INTRAMUSCULAR | Status: AC
  Administered 2020-11-28: 0.5 mL via INTRAMUSCULAR
  Filled 2020-11-27: qty 0.5

## 2020-11-27 MED ORDER — ADULT MULTIVITAMIN W/MINERALS CH
1.0000 | ORAL_TABLET | Freq: Every day | ORAL | Status: DC
  Administered 2020-11-28 – 2020-11-30 (×3): 1 via ORAL
  Filled 2020-11-27 (×3): qty 1

## 2020-11-27 MED ORDER — DOCUSATE SODIUM 50 MG/5ML PO LIQD
100.0000 mg | Freq: Two times a day (BID) | ORAL | Status: DC | PRN
  Filled 2020-11-27: qty 10

## 2020-11-27 MED ORDER — ENSURE ENLIVE PO LIQD
237.0000 mL | Freq: Three times a day (TID) | ORAL | Status: DC
  Administered 2020-11-27 – 2020-11-30 (×9): 237 mL via ORAL

## 2020-11-27 MED ORDER — LACTATED RINGERS IV SOLN: INTRAVENOUS | Status: DC

## 2020-11-27 MED ORDER — QUETIAPINE FUMARATE 25 MG PO TABS
100.0000 mg | ORAL_TABLET | Freq: Three times a day (TID) | ORAL | Status: DC
  Administered 2020-11-27 – 2020-11-29 (×6): 100 mg via ORAL
  Filled 2020-11-27 (×2): qty 1
  Filled 2020-11-27 (×2): qty 4
  Filled 2020-11-27: qty 1
  Filled 2020-11-27: qty 4

## 2020-11-27 MED ORDER — DIAZEPAM 5 MG PO TABS
5.0000 mg | ORAL_TABLET | Freq: Three times a day (TID) | ORAL | Status: DC
  Administered 2020-11-27 – 2020-11-28 (×5): 5 mg via ORAL
  Filled 2020-11-27 (×5): qty 1

## 2020-11-27 MED ORDER — POLYETHYLENE GLYCOL 3350 17 G PO PACK: 17.0000 g | PACK | Freq: Every day | ORAL | Status: DC | PRN

## 2020-11-27 MED ORDER — PANTOPRAZOLE SODIUM 40 MG PO TBEC
40.0000 mg | DELAYED_RELEASE_TABLET | Freq: Every day | ORAL | Status: DC
  Administered 2020-11-27 – 2020-11-29 (×3): 40 mg via ORAL
  Filled 2020-11-27 (×3): qty 1

## 2020-11-27 MED ORDER — HALOPERIDOL LACTATE 5 MG/ML IJ SOLN
5.0000 mg | Freq: Four times a day (QID) | INTRAMUSCULAR | Status: DC | PRN
  Administered 2020-11-27: 5 mg via INTRAVENOUS
  Filled 2020-11-27: qty 1

## 2020-11-27 NOTE — Evaluation (Signed)
Occupational Therapy Evaluation Patient Details Name: Scott Howell MRN: [id number] DOB: [birth year] Today's Date: 11/27/2020   History of Present Illness Scott Howell is a 50 y.o. male with past medical history of COPD, bipolar disorder, schizophrenia, HTN, HDL, CVA and GERD who presents via EMS on 10/12 for reportedly some shoulder pain. Enroute had a seizure and was violent post ictal. He was in acute distress on arrival to the ED and was subsequently intubated. Pt extubated 10/14.   Clinical Impression   Chart reviewed, RN cleared pt for participation in OT evaluation. Co-tx completed with PT as pt was recently extubated. Pt is alert and oriented, however cognition, specifically safety awareness will continue to be assessed. Vcs required throughout evaluation due to increased impulsivity. Pt requires CGA- MINA for all mobility, MIN A for ADL tasks seated at edge of bed. Pt requires verbal cues throughout for pacing, safety during task completion. R hand noted with spasticity with pt with approx 1/2 full AROM to open/shut which affects FMC/dexterity and completion of tasks of package/container management. Pt is left in bedside chair, NAD, all needs met +chair alarm. OT recommends discharge home with home OT, will continue to follow while admitted.    Recommendations for follow up therapy are one component of a multi-disciplinary discharge planning process, led by the attending physician.  Recommendations may be updated based on patient status, additional functional criteria and insurance authorization.   Follow Up Recommendations  Home health OT    Equipment Recommendations  None recommended by OT    Recommendations for Other Services       Precautions / Restrictions Precautions Precautions: None Restrictions Weight Bearing Restrictions: No      Mobility Bed Mobility Overal bed mobility: Needs Assistance Bed Mobility: Supine to Sit     Supine to sit: Supervision      General bed mobility comments: SPV due to impulsivity    Transfers Overall transfer level: Needs assistance Equipment used: Rolling walker (2 wheeled) Transfers: Sit to/from Stand Sit to Stand: Min guard         General transfer comment: CGA for sit to stand from bed surface. No apparent static standing balance impairements noted    Balance Overall balance assessment: Needs assistance Sitting-balance support: Feet supported;No upper extremity supported Sitting balance-Leahy Scale: Normal     Standing balance support: No upper extremity supported;During functional activity Standing balance-Leahy Scale: Fair Standing balance comment: Increased lateral sway with dynamic balance activities.                           ADL either performed or assessed with clinical judgement   ADL Overall ADL's : Needs assistance/impaired   Eating/Feeding Details (indicate cue type and reason): NPO Grooming: Wash/dry face;Wash/dry hands;Sitting Grooming Details (indicate cue type and reason): at edge of bed         Upper Body Dressing : Minimal assistance Upper Body Dressing Details (indicate cue type and reason): gown     Toilet Transfer: Min Psychiatric nurse Details (indicate cue type and reason): simulated to chair         Functional mobility during ADLs: Min guard;Minimal assistance      Pertinent Vitals/Pain Pain Assessment: No/denies pain     Hand Dominance Right   Extremity/Trunk Assessment Upper Extremity Assessment Upper Extremity Assessment: RUE deficits/detail RUE Deficits / Details: spasticity noted in R digits, wrist   Lower Extremity Assessment Lower Extremity Assessment: Overall WFL for tasks assessed  Communication Communication Communication: No difficulties   Cognition Arousal/Alertness: Awake/alert Behavior During Therapy: Impulsive Overall Cognitive Status: No family/caregiver present to determine baseline cognitive  functioning                                 General Comments: Pt impulsive throughout evaluation; requires vcs for pacing during mobility and ADL tasks              Home Living Family/patient expects to be discharged to:: Private residence Living Arrangements: Alone Available Help at Discharge: Family Type of Home:  (pt lives in a camper on land by his mom and step dads house) Home Access: Stairs to enter Technical brewer of Steps: 1 Entrance Stairs-Rails: Right [place]: One level         Biochemist, clinical: [place]: Environmental consultant - 2 wheels;Cane - quad   Additional Comments: Pt reports that he lives on the property of his mother's house in a camper. He reports that he has to go inside her house to shower and have BMs. Pt reports that he family could help intermittently if he needed it      Prior Functioning/Environment Level of Independence: Independent with assistive device(s)        Comments: Reports that he uses RW and SPC at baseline for mobility; Pt reports that he does not drive and is disabled so he does not work        OT Problem List: Decreased strength;Impaired balance (sitting and/or standing);Decreased cognition;Impaired tone;Decreased range of motion;Decreased knowledge of use of DME or AE;Decreased coordination;Decreased activity tolerance;Decreased safety awareness;Impaired UE functional use      OT Treatment/Interventions: Self-care/ADL training;Manual therapy;Therapeutic exercise;DME and/or AE instruction;Cognitive remediation/compensation;Therapeutic activities;Modalities;Neuromuscular education    OT Goals(Current goals can be found in the care plan section) Acute Rehab OT Goals Patient Stated Goal: to go home ADL Goals Pt Will Perform Grooming: standing;with modified independence Pt Will Perform Lower Body Dressing: with modified independence Pt Will Transfer to Toilet: with modified independence Pt Will Perform  Toileting - Clothing Manipulation and hygiene: with modified independence Additional ADL Goal #1: pt will safely complete ADLs as evidenced by no vcs required for safety during ADL task completion  OT Frequency: Min 1X/week       Co-evaluation PT/OT/SLP Co-Evaluation/Treatment: Yes Reason for Co-Treatment: Complexity of the patient's impairments (multi-system involvement);Necessary to address cognition/behavior during functional activity PT goals addressed during session: Mobility/safety with mobility OT goals addressed during session: ADL's and self-care      AM-PAC OT "6 Clicks" Daily Activity     Outcome Measure Help from another person eating meals?: Total (NPO at time of eval) Help from another person taking care of personal grooming?: A Little Help from another person toileting, which includes using toliet, bedpan, or urinal?: A Little Help from another person bathing (including washing, rinsing, drying)?: A Little Help from another person to put on and taking off regular upper body clothing?: A Little Help from another person to put on and taking off regular lower body clothing?: A Little 6 Click Score: 16   End of Session Equipment Utilized During Treatment: Gait belt;Rolling walker Nurse Communication: Mobility status  Activity Tolerance: Patient tolerated treatment well Patient left: in chair;with chair alarm set  OT Visit Diagnosis: Unsteadiness on feet (R26.81);Other abnormalities of gait and mobility (R26.89)  Time: 1003-1036 OT Time Calculation (min): 33 min Charges:  OT General Charges $OT Visit: 1 Visit OT Evaluation $OT Eval Moderate Complexity: 1 Mod OT Treatments $Self Care/Home Management : 8-22 mins  Shanon Payor, OTD OTR/L  11/27/20, 12:24 PM

## 2020-11-27 NOTE — Consult Note (Signed)
[facility] Face-to-Face Psychiatry Consult   Reason for Consult: Consult for 50 year old man with a history of bipolar disorder brought into the hospital with altered mental status and a seizure Referring Physician:  Kasa Patient Identification: Scott Howell MRN:  [id number] Principal Diagnosis: Bipolar 1 disorder, mixed (HCC) Diagnosis:  Principal Problem:   Bipolar 1 disorder, mixed (HCC) Active Problems:   Seizure (HCC)   Total Time spent with patient: 1 hour  Subjective:   Scott Howell is a 50 y.o. male patient admitted with "you got to help me".  HPI: Patient seen chart reviewed.  50 year old man with a past history of bipolar disorder.  EMS was called to the patient's house and reported witnessing a seizure.  Patient was brought to the hospital with altered mental status.  Patient was admitted to the ICU.  Mental status now improving.  Found the patient awake and alert and somewhat hyperactive.  Pressured speech hyperverbal very anxious affect.  Patient says he absolutely needs to get back on the old medicine that he used to take a couple years ago.  Difficult to get much detailed history out of him.  He swears that he has not been on any medicine or taking any pills or drugs at home other than cannabis.  Patient is somewhat disorganized in his speech but not frankly bizarre does not appear to be having hallucinations right now.  Alert and oriented.  Spoke to his mother.  She says he has been very irritable for quite a while and that she suspected that he had been "trading pills" but had no hard proof of it.  Did not report any suicidal or homicidal or dangerous behavior at home.  Past Psychiatric History: Patient has a history of bipolar disorder.  Used to be followed at [facility].  Has not been going there for the last couple years and has been off his medicine for that time.  Does not have a history of suicide attempts  A history of presentation to the hospital.  Risk to Self:   Risk to  Others:   Prior Inpatient Therapy:   Prior Outpatient Therapy:    Past Medical History:  Past Medical History:  Diagnosis Date  . Bipolar 1 disorder (HCC)   . COPD (chronic obstructive pulmonary disease) (HCC)   . GERD (gastroesophageal reflux disease)   . High cholesterol   . Hypertension   . Schizophrenia (HCC)   . Stroke [facility])     Past Surgical History:  Procedure Laterality Date  . CERVICAL SPINE SURGERY     4  . HARDWARE REMOVAL Left 04/20/2015   Procedure: HARDWARE REMOVAL left leg;  Surgeon: Deeann Saint, MD;  Location: [facility] ORS;  Service: Orthopedics;  Laterality: Left;  . KNEE SURGERY Left   . SHOULDER SURGERY Left    Family History:  Family History  Problem Relation Age of Onset  . Diabetes Mother   . Hyperlipidemia Mother   . Hypertension Mother   . Diabetes Maternal Aunt   . Cancer Maternal Aunt   . Hyperlipidemia Maternal Aunt   . Hypertension Maternal Aunt   . Diabetes Maternal Uncle   . Cancer Maternal Uncle   . Hyperlipidemia Maternal Uncle   . Hypertension Maternal Uncle    Family Psychiatric  History: None reported Social History:  Social History   Substance and Sexual Activity  Alcohol Use No  . Alcohol/week: 0.0 standard drinks   Comment: Sober for 12 yrs.     Social History  Substance and Sexual Activity  Drug Use No    Social History   Socioeconomic History  . Marital status: Single    Spouse name: Not on file  . Number of children: Not on file  . Years of education: Not on file  . Highest education level: Not on file  Occupational History  . Not on file  Tobacco Use  . Smoking status: Every Day    Types: Cigarettes  . Smokeless tobacco: Never  Substance and Sexual Activity  . Alcohol use: No    Alcohol/week: 0.0 standard drinks    Comment: Sober for 12 yrs.  . Drug use: No  . Sexual activity: Never  Other Topics Concern  . Not on file  Social History Narrative  . Not on file   Social Determinants of Health    Financial Resource Strain: Not on file  Food Insecurity: Not on file  Transportation Needs: Not on file  Physical Activity: Not on file  Stress: Not on file  Social Connections: Not on file   Additional Social History:    Allergies:   Allergies  Allergen Reactions  . Acetaminophen Other (See Comments)    Pt states that it makes him hyper. Pt states he takes tylenol when needed.     Labs:  Results for orders placed or performed during the hospital encounter of 11/25/20 (from the past 48 hour(s))  Resp Panel by RT-PCR (Flu A&B, Covid)     Status: None   Collection Time: 11/25/20 10:59 PM   Specimen: Nasopharyngeal(NP) swabs in vial transport medium  Result Value Ref Range   SARS Coronavirus 2 by RT PCR NEGATIVE NEGATIVE    Comment: (NOTE) SARS-CoV-2 target nucleic acids are NOT DETECTED.  The SARS-CoV-2 RNA is generally detectable in upper respiratory specimens during the acute phase of infection. The lowest concentration of SARS-CoV-2 viral copies this assay can detect is 138 copies/mL. A negative result does not preclude SARS-Cov-2 infection and should not be used as the sole basis for treatment or other patient management decisions. A negative result may occur with  improper specimen collection/handling, submission of specimen other than nasopharyngeal swab, presence of viral mutation(s) within the areas targeted by this assay, and inadequate number of viral copies(<138 copies/mL). A negative result must be combined with clinical observations, patient history, and epidemiological information. The expected result is Negative.  Fact Sheet for Patients:  BloggerCourse.com  Fact Sheet for Healthcare Providers:  SeriousBroker.it  This test is no t yet approved or cleared by the [place] FDA and  has been authorized for detection and/or diagnosis of SARS-CoV-2 by FDA under an Emergency Use Authorization (EUA). This  EUA will remain  in effect (meaning this test can be used) for the duration of the COVID-19 declaration under Section 564(b)(1) of the Act, 21 U.S.C.section 360bbb-3(b)(1), unless the authorization is terminated  or revoked sooner.       Influenza A by PCR NEGATIVE NEGATIVE   Influenza B by PCR NEGATIVE NEGATIVE    Comment: (NOTE) The Xpert Xpress SARS-CoV-2/FLU/RSV plus assay is intended as an aid in the diagnosis of influenza from Nasopharyngeal swab specimens and should not be used as a sole basis for treatment. Nasal washings and aspirates are unacceptable for Xpert Xpress SARS-CoV-2/FLU/RSV testing.  Fact Sheet for Patients: BloggerCourse.com  Fact Sheet for Healthcare Providers: SeriousBroker.it  This test is not yet approved or cleared by the [place] FDA and has been authorized for detection and/or diagnosis of SARS-CoV-2 by FDA under  an Emergency Use Authorization (EUA). This EUA will remain in effect (meaning this test can be used) for the duration of the COVID-19 declaration under Section 564(b)(1) of the Act, 21 U.S.C. section 360bbb-3(b)(1), unless the authorization is terminated or revoked.  Performed at [facility], [address] [place], [place] [address]   Comprehensive metabolic panel     Status: Abnormal   Collection Time: 11/25/20 10:59 PM  Result Value Ref Range   Sodium 132 (L) 135 - 145 mmol/L   Potassium 3.2 (L) 3.5 - 5.1 mmol/L   Chloride 96 (L) 98 - 111 mmol/L   CO2 16 (L) 22 - 32 mmol/L   Glucose, Bld 121 (H) 70 - 99 mg/dL    Comment: Glucose reference range applies only to samples taken after fasting for at least 8 hours.   BUN <5 (L) 6 - 20 mg/dL   Creatinine, Ser 3.01 0.61 - 1.24 mg/dL   Calcium 9.1 8.9 - 60.1 mg/dL   Total Protein 7.7 6.5 - 8.1 g/dL   Albumin 4.5 3.5 - 5.0 g/dL   AST 67 (H) 15 - 41 U/L   ALT 29 0 - 44 U/L    Comment: RESULT CONFIRMED BY MANUAL DILUTION    Alkaline Phosphatase 49 38 - 126 U/L   Total Bilirubin 0.9 0.3 - 1.2 mg/dL   GFR, Estimated >09 >32 mL/min    Comment: (NOTE) Calculated using the CKD-EPI Creatinine Equation (2021)    Anion gap 20 (H) 5 - 15    Comment: Performed at [facility], [address] [place], [place] [address]  CBC WITH DIFFERENTIAL     Status: Abnormal   Collection Time: 11/25/20 10:59 PM  Result Value Ref Range   WBC 11.0 (H) 4.0 - 10.5 K/uL   RBC 4.71 4.22 - 5.81 MIL/uL   Hemoglobin 14.9 13.0 - 17.0 g/dL   HCT 22.0 25.4 - 27.0 %   MCV 89.4 80.0 - 100.0 fL   MCH 31.6 26.0 - 34.0 pg   MCHC 35.4 30.0 - 36.0 g/dL   RDW 62.3 76.2 - 83.1 %   Platelets 246 150 - 400 K/uL   nRBC 0.0 0.0 - 0.2 %   Neutrophils Relative % 59 %   Neutro Abs 6.6 1.7 - 7.7 K/uL   Lymphocytes Relative 33 %   Lymphs Abs 3.6 0.7 - 4.0 K/uL   Monocytes Relative 6 %   Monocytes Absolute 0.6 0.1 - 1.0 K/uL   Eosinophils Relative 1 %   Eosinophils Absolute 0.1 0.0 - 0.5 K/uL   Basophils Relative 1 %   Basophils Absolute 0.1 0.0 - 0.1 K/uL   Immature Granulocytes 0 %   Abs Immature Granulocytes 0.03 0.00 - 0.07 K/uL    Comment: Performed at [facility], [address] [place], [place] [address]  Protime-INR     Status: None   Collection Time: 11/25/20 10:59 PM  Result Value Ref Range   Prothrombin Time 14.1 11.4 - 15.2 seconds   INR 1.1 0.8 - 1.2    Comment: (NOTE) INR goal varies based on device and disease states. Performed at [facility], [address] [place], [place] [address]   APTT     Status: None   Collection Time: 11/25/20 10:59 PM  Result Value Ref Range   aPTT 27 24 - 36 seconds    Comment: Performed at [facility], [address] [place], [place] [address]  Urinalysis, Complete w Microscopic  Status: Abnormal   Collection Time: 11/25/20 10:59 PM  Result Value Ref Range   Color, Urine STRAW (A) YELLOW   APPearance CLEAR (A) CLEAR   Specific Gravity, Urine  1.009 1.005 - 1.030   pH 5.0 5.0 - 8.0   Glucose, UA NEGATIVE NEGATIVE mg/dL   Hgb urine dipstick LARGE (A) NEGATIVE   Bilirubin Urine NEGATIVE NEGATIVE   Ketones, ur 5 (A) NEGATIVE mg/dL   Protein, ur NEGATIVE NEGATIVE mg/dL   Nitrite NEGATIVE NEGATIVE   Leukocytes,Ua NEGATIVE NEGATIVE   RBC / HPF 0-5 0 - 5 RBC/hpf   WBC, UA 0-5 0 - 5 WBC/hpf   Bacteria, UA RARE (A) NONE SEEN   Squamous Epithelial / LPF NONE SEEN 0 - 5   Mucus PRESENT     Comment: Performed at [facility], [address] [place], [place] [address]  Troponin I (High Sensitivity)     Status: None   Collection Time: 11/25/20 10:59 PM  Result Value Ref Range   Troponin I (High Sensitivity) 7 <18 ng/L    Comment: (NOTE) Elevated high sensitivity troponin I (hsTnI) values and significant  changes across serial measurements may suggest ACS but many other  chronic and acute conditions are known to elevate hsTnI results.  Refer to the "Links" section for chest pain algorithms and additional  guidance. Performed at [facility], [address] [place], [place] [address]   Magnesium     Status: None   Collection Time: 11/25/20 10:59 PM  Result Value Ref Range   Magnesium 2.2 1.7 - 2.4 mg/dL    Comment: Performed at [facility], [address] [place], [place] [address]  Ethanol     Status: None   Collection Time: 11/25/20 10:59 PM  Result Value Ref Range   Alcohol, Ethyl (B) <10 <10 mg/dL    Comment: (NOTE) Lowest detectable limit for serum alcohol is 10 mg/dL.  For medical purposes only. Performed at [facility], [address] [place], [place] [address]   Salicylate level     Status: Abnormal   Collection Time: 11/25/20 10:59 PM  Result Value Ref Range   Salicylate Lvl <7.0 (L) 7.0 - 30.0 mg/dL    Comment: Performed at [facility], [address] [place], [place] [address]  Acetaminophen level     Status: Abnormal   Collection Time: 11/25/20 10:59 PM   Result Value Ref Range   Acetaminophen (Tylenol), Serum <10 (L) 10 - 30 ug/mL    Comment: (NOTE) Therapeutic concentrations vary significantly. A range of 10-30 ug/mL  may be an effective concentration for many patients. However, some  are best treated at concentrations outside of this range. Acetaminophen concentrations >150 ug/mL at 4 hours after ingestion  and >50 ug/mL at 12 hours after ingestion are often associated with  toxic reactions.  Performed at [facility], [address] [place], [place] [address]   CK     Status: Abnormal   Collection Time: 11/25/20 10:59 PM  Result Value Ref Range   Total CK 2,004 (H) 49 - 397 U/L    Comment: Performed at [facility], [address] [place], [place] [address]  Urine Drug Screen, Qualitative [facility] only)     Status: Abnormal   Collection Time: 11/25/20 10:59 PM  Result Value Ref Range   Tricyclic, Ur Screen NONE DETECTED NONE DETECTED   Amphetamines, Ur Screen NONE DETECTED NONE DETECTED   MDMA (Ecstasy)Ur Screen NONE DETECTED NONE DETECTED  Cocaine Metabolite,Ur [place] NONE DETECTED NONE DETECTED   Opiate, Ur Screen NONE DETECTED NONE DETECTED   Phencyclidine (PCP) Ur S NONE DETECTED NONE DETECTED   Cannabinoid 50 Ng, Ur [place] POSITIVE (A) NONE DETECTED   Barbiturates, Ur Screen NONE DETECTED NONE DETECTED   Benzodiazepine, Ur Scrn POSITIVE (A) NONE DETECTED   Methadone Scn, Ur NONE DETECTED NONE DETECTED    Comment: (NOTE) Tricyclics + metabolites, urine    Cutoff 1000 ng/mL Amphetamines + metabolites, urine  Cutoff 1000 ng/mL MDMA (Ecstasy), urine              Cutoff 500 ng/mL Cocaine Metabolite, urine          Cutoff 300 ng/mL Opiate + metabolites, urine        Cutoff 300 ng/mL Phencyclidine (PCP), urine         Cutoff 25 ng/mL Cannabinoid, urine                 Cutoff 50 ng/mL Barbiturates + metabolites, urine  Cutoff 200 ng/mL Benzodiazepine, urine              Cutoff 200 ng/mL Methadone, urine                    Cutoff 300 ng/mL  The urine drug screen provides only a preliminary, unconfirmed analytical test result and should not be used for non-medical purposes. Clinical consideration and professional judgment should be applied to any positive drug screen result due to possible interfering substances. A more specific alternate chemical method must be used in order to obtain a confirmed analytical result. Gas chromatography / mass spectrometry (GC/MS) is the preferred confirm atory method. Performed at [facility], [address] [place], [place] [address]   Procalcitonin - Baseline     Status: None   Collection Time: 11/25/20 10:59 PM  Result Value Ref Range   Procalcitonin <0.10 ng/mL    Comment:        Interpretation: PCT (Procalcitonin) <= 0.5 ng/mL: Systemic infection (sepsis) is not likely. Local bacterial infection is possible. (NOTE)       Sepsis PCT Algorithm           Lower Respiratory Tract                                      Infection PCT Algorithm    ----------------------------     ----------------------------         PCT < 0.25 ng/mL                PCT < 0.10 ng/mL          Strongly encourage             Strongly discourage   discontinuation of antibiotics    initiation of antibiotics    ----------------------------     -----------------------------       PCT 0.25 - 0.50 ng/mL            PCT 0.10 - 0.25 ng/mL               OR       >80% decrease in PCT            Discourage initiation of  antibiotics      Encourage discontinuation           of antibiotics    ----------------------------     -----------------------------         PCT >= 0.50 ng/mL              PCT 0.26 - 0.50 ng/mL               AND        <80% decrease in PCT             Encourage initiation of                                             antibiotics       Encourage continuation           of antibiotics    ----------------------------      -----------------------------        PCT >= 0.50 ng/mL                  PCT > 0.50 ng/mL               AND         increase in PCT                  Strongly encourage                                      initiation of antibiotics    Strongly encourage escalation           of antibiotics                                     -----------------------------                                           PCT <= 0.25 ng/mL                                                 OR                                        > 80% decrease in PCT                                      Discontinue / Do not initiate                                             antibiotics  Performed at [facility], [address] [place], [place] [address]   TSH     Status: None   Collection Time: 11/25/20  10:59 PM  Result Value Ref Range   TSH 3.259 0.350 - 4.500 uIU/mL    Comment: Performed by a 3rd Generation assay with a functional sensitivity of <=0.01 uIU/mL. Performed at [facility], [address] [place], [place] [address]   Troponin I (High Sensitivity)     Status: None   Collection Time: 11/25/20 10:59 PM  Result Value Ref Range   Troponin I (High Sensitivity) 13 <18 ng/L    Comment: (NOTE) Elevated high sensitivity troponin I (hsTnI) values and significant  changes across serial measurements may suggest ACS but many other  chronic and acute conditions are known to elevate hsTnI results.  Refer to the "Links" section for chest pain algorithms and additional  guidance. Performed at [facility], [address] [place], [place] [address]   Blood gas, venous     Status: Abnormal   Collection Time: 11/25/20 11:00 PM  Result Value Ref Range   FIO2 100.00    Delivery systems VENTILATOR    Mode ASSIST CONTROL    VT 450 mL   LHR 18 resp/min   Peep/cpap 5.0 cm H20   pH, Ven 7.17 (LL) 7.250 - 7.430    Comment: CRITICAL RESULT CALLED TO, READ BACK BY AND VERIFIED WITH:  DR. Katrinka Blazing,  [id number] [phone]    pCO2, Ven 39 (L) 44.0 - 60.0 mmHg   pO2, Ven 232.0 (H) 32.0 - 45.0 mmHg   Bicarbonate 14.2 (L) 20.0 - 28.0 mmol/L   Acid-base deficit 13.7 (H) 0.0 - 2.0 mmol/L   O2 Saturation 99.7 %   Patient temperature 37.0    Collection site VEIN    Sample type VENOUS     Comment: Performed at [facility], [address] [place], [place] [address]  Lactic acid, plasma     Status: Abnormal   Collection Time: 11/26/20 12:13 AM  Result Value Ref Range   Lactic Acid, Venous 2.3 (HH) 0.5 - 1.9 mmol/L    Comment: CRITICAL RESULT CALLED TO, READ BACK BY AND VERIFIED WITH [place] LOCKEE RN [phone] 11/26/20 HNM Performed at [facility], [address] [place], [place] [address]   Blood gas, arterial     Status: Abnormal   Collection Time: 11/26/20  1:07 AM  Result Value Ref Range   FIO2 40.00    Delivery systems VENTILATOR    Mode PRESSURE REGULATED VOLUME CONTROL    VT 640 mL   LHR 20 resp/min   Peep/cpap 5.0 cm H20   pH, Arterial 7.39 7.350 - 7.450   pCO2 arterial 42 32.0 - 48.0 mmHg   pO2, Arterial 127 (H) 83.0 - 108.0 mmHg   Bicarbonate 25.4 20.0 - 28.0 mmol/L   Acid-Base Excess 0.3 0.0 - 2.0 mmol/L   O2 Saturation 98.9 %   Patient temperature 37.0    Collection site RIGHT RADIAL    Sample type ARTERIAL DRAW    Allens test (pass/fail) PASS PASS    Comment: Performed at [facility], [address] [place], [place] [address]  Glucose, capillary     Status: None   Collection Time: 11/26/20  1:24 AM  Result Value Ref Range   Glucose-Capillary 98 70 - 99 mg/dL    Comment: Glucose reference range applies only to samples taken after fasting for at least 8 hours.  Lactic acid, plasma     Status: None   Collection Time: 11/26/20  1:57 AM  Result Value Ref Range   Lactic Acid, Venous 1.2  0.5 - 1.9 mmol/L    Comment: Performed at [facility], [address] [name], [place] [address]  Triglycerides     Status: None   Collection  Time: 11/26/20  1:57 AM  Result Value Ref Range   Triglycerides 107 <150 mg/dL    Comment: Performed at [facility], [address] [place], [place] [address]  Culture, blood (single) w Reflex to ID Panel     Status: None (Preliminary result)   Collection Time: 11/26/20  1:57 AM   Specimen: BLOOD  Result Value Ref Range   Specimen Description BLOOD LEFT HAND    Special Requests      BOTTLES DRAWN AEROBIC AND ANAEROBIC Blood Culture results may not be optimal due to an excessive volume of blood received in culture bottles   Culture      NO GROWTH 1 DAY Performed at [facility], [address], [place], [place] [address]    Report Status PENDING   HIV Antibody (routine testing w rflx)     Status: None   Collection Time: 11/26/20  1:57 AM  Result Value Ref Range   HIV Screen 4th Generation wRfx Non Reactive Non Reactive    Comment: Performed at [facility], [address], [place], [place] [address]  Hemoglobin A1c     Status: Abnormal   Collection Time: 11/26/20  1:57 AM  Result Value Ref Range   Hgb A1c MFr Bld 5.8 (H) 4.8 - 5.6 %    Comment: (NOTE)         Prediabetes: 5.7 - 6.4         Diabetes: >6.4         Glycemic control for adults with diabetes: <7.0    Mean Plasma Glucose 120 mg/dL    Comment: (NOTE) Performed At: [facility] [address] [place], [place] [id number] Jolene Schimke MD MV:[id number]   Lipid panel     Status: Abnormal   Collection Time: 11/26/20  1:57 AM  Result Value Ref Range   Cholesterol 120 0 - 200 mg/dL   Triglycerides 841 <324 mg/dL   HDL 30 (L) >40 mg/dL   Total CHOL/HDL Ratio 4.0 RATIO   VLDL 22 0 - 40 mg/dL   LDL Cholesterol 68 0 - 99 mg/dL    Comment:        Total Cholesterol/HDL:CHD Risk Coronary Heart Disease Risk Table                     Men   Women  1/2 Average Risk   3.4   3.3  Average Risk       5.0   4.4  2 X Average Risk   9.6   7.1  3 X Average Risk  23.4   11.0        Use the  calculated Patient Ratio above and the CHD Risk Table to determine the patient's CHD Risk.        ATP III CLASSIFICATION (LDL):  <100     mg/dL   Optimal  102-725  mg/dL   Near or Above                    Optimal  130-159  mg/dL   Borderline  366-440  mg/dL   High  >347     mg/dL   Very High Performed at [facility], [address] [place], [place] [address]   CBC  Status: Abnormal   Collection Time: 11/26/20  1:57 AM  Result Value Ref Range   WBC 9.9 4.0 - 10.5 K/uL   RBC 4.41 4.22 - 5.81 MIL/uL   Hemoglobin 13.7 13.0 - 17.0 g/dL   HCT 16.1 (L) 09.6 - 04.5 %   MCV 86.2 80.0 - 100.0 fL   MCH 31.1 26.0 - 34.0 pg   MCHC 36.1 (H) 30.0 - 36.0 g/dL   RDW 40.9 81.1 - 91.4 %   Platelets 193 150 - 400 K/uL   nRBC 0.0 0.0 - 0.2 %    Comment: Performed at [facility], [address], [place], [place] [address]  Basic metabolic panel     Status: Abnormal   Collection Time: 11/26/20  1:57 AM  Result Value Ref Range   Sodium 129 (L) 135 - 145 mmol/L   Potassium 3.4 (L) 3.5 - 5.1 mmol/L   Chloride 96 (L) 98 - 111 mmol/L   CO2 25 22 - 32 mmol/L   Glucose, Bld 103 (H) 70 - 99 mg/dL    Comment: Glucose reference range applies only to samples taken after fasting for at least 8 hours.   BUN <5 (L) 6 - 20 mg/dL   Creatinine, Ser 6.21 0.61 - 1.24 mg/dL   Calcium 8.4 (L) 8.9 - 10.3 mg/dL   GFR, Estimated >30 >86 mL/min    Comment: (NOTE) Calculated using the CKD-EPI Creatinine Equation (2021)    Anion gap 8 5 - 15    Comment: Performed at [facility], [address] [place], [place] [address]  Magnesium     Status: None   Collection Time: 11/26/20  1:57 AM  Result Value Ref Range   Magnesium 1.9 1.7 - 2.4 mg/dL    Comment: Performed at [facility], [address], [place], [place] [address]  Phosphorus     Status: None   Collection Time: 11/26/20  1:57 AM  Result Value Ref Range   Phosphorus 3.6 2.5 - 4.6 mg/dL    Comment: Performed at  [facility], [address] [place], [place] [address]  Protime-INR     Status: None   Collection Time: 11/26/20  1:57 AM  Result Value Ref Range   Prothrombin Time 13.6 11.4 - 15.2 seconds   INR 1.0 0.8 - 1.2    Comment: (NOTE) INR goal varies based on device and disease states. Performed at [facility], [address] [place], [place] [address]   APTT     Status: None   Collection Time: 11/26/20  1:57 AM  Result Value Ref Range   aPTT 28 24 - 36 seconds    Comment: Performed at [facility], [address] [place], [place] [address]  Glucose, capillary     Status: None   Collection Time: 11/26/20  7:16 AM  Result Value Ref Range   Glucose-Capillary 83 70 - 99 mg/dL    Comment: Glucose reference range applies only to samples taken after fasting for at least 8 hours.  Glucose, capillary     Status: None   Collection Time: 11/26/20 10:54 AM  Result Value Ref Range   Glucose-Capillary 79 70 - 99 mg/dL    Comment: Glucose reference range applies only to samples taken after fasting for at least 8 hours.  Glucose, capillary     Status: None   Collection Time: 11/26/20  3:31 PM  Result Value Ref Range   Glucose-Capillary 77 70 - 99 mg/dL  Comment: Glucose reference range applies only to samples taken after fasting for at least 8 hours.  Glucose, capillary     Status: Abnormal   Collection Time: 11/26/20  7:28 PM  Result Value Ref Range   Glucose-Capillary 59 (L) 70 - 99 mg/dL    Comment: Glucose reference range applies only to samples taken after fasting for at least 8 hours.  Glucose, capillary     Status: None   Collection Time: 11/26/20  8:16 PM  Result Value Ref Range   Glucose-Capillary 99 70 - 99 mg/dL    Comment: Glucose reference range applies only to samples taken after fasting for at least 8 hours.  CBC     Status: None   Collection Time: 11/26/20  8:48 PM  Result Value Ref Range   WBC 7.5 4.0 - 10.5 K/uL   RBC 4.35 4.22 - 5.81  MIL/uL   Hemoglobin 14.0 13.0 - 17.0 g/dL   HCT 16.1 09.6 - 04.5 %   MCV 89.9 80.0 - 100.0 fL   MCH 32.2 26.0 - 34.0 pg   MCHC 35.8 30.0 - 36.0 g/dL   RDW 40.9 81.1 - 91.4 %   Platelets 169 150 - 400 K/uL   nRBC 0.0 0.0 - 0.2 %    Comment: Performed at [facility], [address], [place], [place] [address]  Basic metabolic panel     Status: Abnormal   Collection Time: 11/26/20  8:48 PM  Result Value Ref Range   Sodium 139 135 - 145 mmol/L   Potassium 2.8 (L) 3.5 - 5.1 mmol/L   Chloride 108 98 - 111 mmol/L   CO2 25 22 - 32 mmol/L   Glucose, Bld 78 70 - 99 mg/dL    Comment: Glucose reference range applies only to samples taken after fasting for at least 8 hours.   BUN <5 (L) 6 - 20 mg/dL   Creatinine, Ser 6.21 0.61 - 1.24 mg/dL   Calcium 8.4 (L) 8.9 - 10.3 mg/dL   GFR, Estimated >30 >86 mL/min    Comment: (NOTE) Calculated using the CKD-EPI Creatinine Equation (2021)    Anion gap 6 5 - 15    Comment: Performed at [facility], [address], [place], [place] [address]  Magnesium     Status: None   Collection Time: 11/26/20  8:48 PM  Result Value Ref Range   Magnesium 2.1 1.7 - 2.4 mg/dL    Comment: Performed at [facility], [address] [place], [place] [address]  CK     Status: Abnormal   Collection Time: 11/26/20  8:48 PM  Result Value Ref Range   Total CK 901 (H) 49 - 397 U/L    Comment: Performed at [facility], [address] [place], [place] [address]  Lactic acid, plasma     Status: None   Collection Time: 11/26/20  8:48 PM  Result Value Ref Range   Lactic Acid, Venous 1.4 0.5 - 1.9 mmol/L    Comment: Performed at [facility], [address], [place], [place] [address]  Urine Drug Screen, Qualitative ([facility] only)     Status: Abnormal   Collection Time: 11/26/20  9:10 PM  Result Value Ref Range   Tricyclic, Ur Screen NONE DETECTED NONE DETECTED   Amphetamines, Ur Screen NONE DETECTED NONE DETECTED   MDMA  (Ecstasy)Ur Screen NONE DETECTED NONE DETECTED   Cocaine Metabolite,Ur [place] NONE DETECTED NONE DETECTED   Opiate, Ur Screen NONE DETECTED NONE  DETECTED   Phencyclidine (PCP) Ur S NONE DETECTED NONE DETECTED   Cannabinoid 50 Ng, Ur [place] POSITIVE (A) NONE DETECTED   Barbiturates, Ur Screen NONE DETECTED NONE DETECTED   Benzodiazepine, Ur Scrn POSITIVE (A) NONE DETECTED   Methadone Scn, Ur NONE DETECTED NONE DETECTED    Comment: (NOTE) Tricyclics + metabolites, urine    Cutoff 1000 ng/mL Amphetamines + metabolites, urine  Cutoff 1000 ng/mL MDMA (Ecstasy), urine              Cutoff 500 ng/mL Cocaine Metabolite, urine          Cutoff 300 ng/mL Opiate + metabolites, urine        Cutoff 300 ng/mL Phencyclidine (PCP), urine         Cutoff 25 ng/mL Cannabinoid, urine                 Cutoff 50 ng/mL Barbiturates + metabolites, urine  Cutoff 200 ng/mL Benzodiazepine, urine              Cutoff 200 ng/mL Methadone, urine                   Cutoff 300 ng/mL  The urine drug screen provides only a preliminary, unconfirmed analytical test result and should not be used for non-medical purposes. Clinical consideration and professional judgment should be applied to any positive drug screen result due to possible interfering substances. A more specific alternate chemical method must be used in order to obtain a confirmed analytical result. Gas chromatography / mass spectrometry (GC/MS) is the preferred confirm atory method. Performed at [facility], [address] [place], [place] [address]   MRSA Next Gen by PCR, Nasal     Status: None   Collection Time: 11/26/20  9:10 PM   Specimen: Nasal Mucosa; Nasal Swab  Result Value Ref Range   MRSA by PCR Next Gen NOT DETECTED NOT DETECTED    Comment: (NOTE) The GeneXpert MRSA Assay (FDA approved for NASAL specimens only), is one component of a comprehensive MRSA colonization surveillance program. It is not intended to diagnose MRSA infection nor to  guide or monitor treatment for MRSA infections. Test performance is not FDA approved in patients less than 22 years old. Performed at [facility], [address] [place], [place] [address]   Glucose, capillary     Status: None   Collection Time: 11/26/20 11:24 PM  Result Value Ref Range   Glucose-Capillary 83 70 - 99 mg/dL    Comment: Glucose reference range applies only to samples taken after fasting for at least 8 hours.  Glucose, capillary     Status: Abnormal   Collection Time: 11/27/20  3:32 AM  Result Value Ref Range   Glucose-Capillary 100 (H) 70 - 99 mg/dL    Comment: Glucose reference range applies only to samples taken after fasting for at least 8 hours.  CBC     Status: None   Collection Time: 11/27/20  5:54 AM  Result Value Ref Range   WBC 8.0 4.0 - 10.5 K/uL   RBC 4.36 4.22 - 5.81 MIL/uL   Hemoglobin 13.7 13.0 - 17.0 g/dL   HCT 09.8 11.9 - 14.7 %   MCV 90.6 80.0 - 100.0 fL   MCH 31.4 26.0 - 34.0 pg   MCHC 34.7 30.0 - 36.0 g/dL   RDW 82.9 56.2 - 13.0 %   Platelets 181 150 - 400 K/uL   nRBC 0.0 0.0 - 0.2 %  Comment: Performed at [facility], [address] [place], [place] [address]  Basic metabolic panel     Status: Abnormal   Collection Time: 11/27/20  5:54 AM  Result Value Ref Range   Sodium 139 135 - 145 mmol/L   Potassium 3.7 3.5 - 5.1 mmol/L   Chloride 108 98 - 111 mmol/L   CO2 25 22 - 32 mmol/L   Glucose, Bld 96 70 - 99 mg/dL    Comment: Glucose reference range applies only to samples taken after fasting for at least 8 hours.   BUN <5 (L) 6 - 20 mg/dL   Creatinine, Ser 6.04 0.61 - 1.24 mg/dL   Calcium 8.3 (L) 8.9 - 10.3 mg/dL   GFR, Estimated >54 >09 mL/min    Comment: (NOTE) Calculated using the CKD-EPI Creatinine Equation (2021)    Anion gap 6 5 - 15    Comment: Performed at [facility], [address] [place], [place] [address]  CK     Status: Abnormal   Collection Time: 11/27/20  5:54 AM  Result Value Ref  Range   Total CK 604 (H) 49 - 397 U/L    Comment: Performed at [facility], [address], [place], [place] [address]  Magnesium     Status: None   Collection Time: 11/27/20  5:54 AM  Result Value Ref Range   Magnesium 2.1 1.7 - 2.4 mg/dL    Comment: Performed at [facility], [address] [place], [place] [address]  Phosphorus     Status: None   Collection Time: 11/27/20  5:54 AM  Result Value Ref Range   Phosphorus 3.9 2.5 - 4.6 mg/dL    Comment: Performed at [facility], [address] [place], [place] [address]  Glucose, capillary     Status: None   Collection Time: 11/27/20  7:14 AM  Result Value Ref Range   Glucose-Capillary 96 70 - 99 mg/dL    Comment: Glucose reference range applies only to samples taken after fasting for at least 8 hours.  Glucose, capillary     Status: Abnormal   Collection Time: 11/27/20 11:26 AM  Result Value Ref Range   Glucose-Capillary 114 (H) 70 - 99 mg/dL    Comment: Glucose reference range applies only to samples taken after fasting for at least 8 hours.  Glucose, capillary     Status: Abnormal   Collection Time: 11/27/20  3:22 PM  Result Value Ref Range   Glucose-Capillary 119 (H) 70 - 99 mg/dL    Comment: Glucose reference range applies only to samples taken after fasting for at least 8 hours.    Current Facility-Administered Medications  Medication Dose Route Frequency Provider Last Rate Last Admin  . aspirin tablet 325 mg  325 mg Per Tube Daily Erin Fulling, MD   325 mg at 11/27/20 1103  . budesonide (PULMICORT) nebulizer solution 0.25 mg  0.25 mg Nebulization BID Jimmye Norman, NP   0.25 mg at 11/27/20 0745  . Chlorhexidine Gluconate Cloth 2 % PADS 6 each  6 each Topical Daily Erin Fulling, MD   6 each at 11/27/20 1016  . dextrose 5 %-0.45 % sodium chloride infusion   Intravenous Continuous Jimmye Norman, NP 125 mL/hr at 11/27/20 1524 New Bag at 11/27/20 1524  . diazepam (VALIUM)  injection 5 mg  5 mg Intravenous Q6H PRN Harlon Ditty D, NP   5 mg at 11/27/20 1331  . diazepam (VALIUM) tablet 5 mg  5 mg Oral TID , Jackquline Denmark, MD   5 mg at 11/27/20 1526  . docusate (COLACE) 50 MG/5ML liquid 100 mg  100 mg Per Tube BID PRN Jimmye Norman, NP      . docusate (COLACE) 50 MG/5ML liquid 100 mg  100 mg Per Tube BID Jimmye Norman, NP   100 mg at 11/27/20 1104  . enoxaparin (LOVENOX) injection 40 mg  40 mg Subcutaneous Q24H Salena Saner, MD      . feeding supplement (ENSURE ENLIVE / ENSURE PLUS) liquid 237 mL  237 mL Oral TID BM Erin Fulling, MD      . haloperidol lactate (HALDOL) injection 5 mg  5 mg Intravenous Q6H PRN Harlon Ditty D, NP      . ipratropium-albuterol (DUONEB) 0.5-2.5 (3) MG/3ML nebulizer solution 3 mL  3 mL Nebulization Q6H PRN Jimmye Norman, NP      . levETIRAcetam (KEPPRA) IVPB 500 mg/100 mL premix  500 mg Intravenous Q12H Jimmye Norman, NP   Stopped at 11/27/20 1220  . [START ON 11/28/2020] multivitamin with minerals tablet 1 tablet  1 tablet Oral Daily Kasa, Kurian, MD      . pantoprazole (PROTONIX) injection 40 mg  40 mg Intravenous QHS Jimmye Norman, NP   40 mg at 11/26/20 2125  . polyethylene glycol (MIRALAX / GLYCOLAX) packet 17 g  17 g Per Tube Daily PRN Jimmye Norman, NP      . polyethylene glycol (MIRALAX / GLYCOLAX) packet 17 g  17 g Per Tube Daily Jimmye Norman, NP   17 g at 11/27/20 1104  . potassium chloride (KLOR-CON) packet 40 mEq  40 mEq Per Tube BID Jimmye Norman, NP   40 mEq at 11/27/20 1103  . QUEtiapine (SEROQUEL) tablet 100 mg  100 mg Oral TID , Jackquline Denmark, MD   100 mg at 11/27/20 1527    Musculoskeletal: Strength & Muscle Tone: within normal limits Gait & Station:  Not able to test as he is bedbound in the ICU Patient leans: N/A            Psychiatric Specialty Exam:  Presentation  General Appearance:  No data recorded Eye  Contact: No data recorded Speech: No data recorded Speech Volume: No data recorded Handedness: No data recorded  Mood and Affect  Mood: No data recorded Affect: No data recorded  Thought Process  Thought Processes: No data recorded Descriptions of Associations:No data recorded Orientation:No data recorded Thought Content:No data recorded History of Schizophrenia/Schizoaffective disorder:No data recorded Duration of Psychotic Symptoms:No data recorded Hallucinations:No data recorded Ideas of Reference:No data recorded Suicidal Thoughts:No data recorded Homicidal Thoughts:No data recorded  Sensorium  Memory: No data recorded Judgment: No data recorded Insight: No data recorded  Executive Functions  Concentration: No data recorded Attention Span: No data recorded Recall: No data recorded Fund of Knowledge: No data recorded Language: No data recorded  Psychomotor Activity  Psychomotor Activity: No data recorded  Assets  Assets: No data recorded  Sleep  Sleep: No data recorded  Physical Exam: Physical Exam Vitals and nursing note reviewed.  Constitutional:      Appearance: Normal appearance.  HENT:     Head: Normocephalic and atraumatic.     Mouth/Throat:     Pharynx: Oropharynx is clear.  Eyes:     Pupils: Pupils are equal, round, and reactive to light.  Cardiovascular:     Rate and Rhythm: Normal rate and regular rhythm.  Pulmonary:  Effort: Pulmonary effort is normal.     Breath sounds: Normal breath sounds.  Abdominal:     General: Abdomen is flat.     Palpations: Abdomen is soft.  Musculoskeletal:        General: Normal range of motion.  Skin:    General: Skin is warm and dry.  Neurological:     General: No focal deficit present.     Mental Status: He is alert. Mental status is at baseline.  Psychiatric:        Attention and Perception: Attention normal.        Mood and Affect: Mood is anxious. Affect is labile.         Speech: Speech is rapid and pressured and tangential.        Behavior: Behavior is agitated. Behavior is not aggressive.        Thought Content: Thought content normal. Thought content does not include homicidal or suicidal ideation.        Cognition and Memory: Cognition is impaired. Memory is impaired.        Judgment: Judgment is impulsive.   Review of Systems  Constitutional: Negative.   HENT: Negative.    Eyes: Negative.   Respiratory: Negative.    Cardiovascular: Negative.   Gastrointestinal: Negative.   Musculoskeletal: Negative.   Skin: Negative.   Neurological: Negative.   Psychiatric/Behavioral:  Negative for depression, substance abuse and suicidal ideas. The patient is nervous/anxious and has insomnia.   Blood pressure 139/73, pulse 90, temperature 97.9 F (36.6 C), temperature source Axillary, resp. rate (!) 39, height  (1.854 m), weight 99.8 kg, SpO2 96 %. Body mass index is 29.03 kg/m.  Treatment Plan Summary: Medication management and Plan check back and reviewed his old chart.  Indeed he used to be on Seroquel 1 to 200 mg 3 times a day Valium 5 mg 3 times a day and also on Cymbalta.  Less important at this point to restart the Cymbalta given that he has some manic symptoms.  Immediate plan is to restart him on Seroquel and diazepam.  1 hypothesis I would suggest for the seizure would be if he had been abusing benzodiazepines at home and then was withdrawing from them.  Cannot prove this though.  He has a benzodiazepine positive on his drug screen but it was probably obtained after he had been given several doses of Versed.  Other possibility is hyponatremia.  We will continue to follow in the hospital.  He needs to follow up with [facility] once he is ready for discharge  Disposition: No evidence of imminent risk to self or others at present.   Patient does not meet criteria for psychiatric inpatient admission. Supportive therapy provided about ongoing stressors. Discussed  crisis plan, support from social network, calling 911, coming to the Emergency Department, and calling Suicide Hotline.  Mordecai Rasmussen, MD 11/27/2020 3:48 PM

## 2020-11-27 NOTE — H&P (Deleted)
NAME:  Scott Howell, MRN:  [id number], DOB:  15-Aug-1970, LOS: 1 ADMISSION DATE:  11/25/2020, CONSULTATION DATE:  11/26/2020 REFERRING MD: Pryor Curia, MD CHIEF COMPLAINT:  Seizures   HPI  50 y.o with significant PMH of bipolar disorder, polysubstance abuse, chronic pain associated with significant psychosocial dysfunction, lumbar canal stenosis, hyperlipidemia, schizophrenia, CVA, hypertension, and COPD who presented to the ED with witnessed seizures like activity.  Per ED reports, EMS was apparently called out for shoulder and back pain.  On EMS arrival, patient suddenly began to posture followed by seizure-like activity lasting less than 30 seconds.  He was also noted to be bradycardic in the 30s that resolved prior to medication administration.  There were no reports of loss of  bladder function or tongue biting.  Patient was noted to be postictal and very difficult to redirect violently thrashing and attempting to get out of the stretcher.  ED Course: On arrival to the ED, he was afebrile with blood pressure 204/81 mm Hg and pulse rate 158 beats/min, RR 43 breaths per minute, oxygen saturation 100% on nonrebreather.  She was noted to be in acute distress, diaphoretic and toxic appearing.  He was agitated, thrashing about and very aggressive requiring multiple people to hold him down.  Patient was given 6 mg grams of Versed without any effect.  He was subsequently intubated for airway protection.   Labs/Diagnostics WBC/Hgb/Hct/Plts:  8.0/13.7/39.5/181 (10/14 0554)  Na+ 132, K+ 3.2, CO2 16, glucose 121, AST 67/ALT 29, anion gap 20 UA: No evidence of UTI UDS: Positive for marijuana Venous Blood Gas result:  pO2 232; pCO2 39; pH 7.17;  HCO3 14.2, %O2 Sat 99.7 Lactate: 2.3 CK: 2004 EKG: Sinus tachycardia with a ventricular rate of 151 bpm with ventricular bigeminy and multiple nonspecific changes. CXR: No evidence of focal consolidation, overt edema, pneumothorax, effusion or other acute  cardiopulmonary process.  PCCM consulted for admission to ICU and medical optimization. Past Medical History  bipolar disorder polysubstance abuse chronic pain associated with significant psychosocial dysfunction lumbar canal stenosis Hyperlipidemia Schizophrenia CVA Hypertension COPD  Significant Hospital Events   10/13: Admitted to ICU with seizure-like activity 10/14: Successfully extubated, Psych consulted, plan for MRI Cervical spine as per Neurology  Consults:  Neurology  Procedures:  10/13: Intubation  Significant Diagnostic Tests:  10/13: Chest Xray>No focal consolidation. No pulmonary edema. No pleural effusion. No pneumothorax 10/13: Noncontrast CT head>Possible subacute infarct within the left cerebellar hemisphere without associated mass effect or superimposed acute intracranial hemorrhage 10/13: MRI Brain/ MRA Head>>IMPRESSION: 1. Negative for acute infarct 2. Mild white matter changes most likely due to chronic microvascular ischemia 3. Small distal right vertebral artery which may be due to acquired atherosclerotic stenosis versus congenital hypoplasia. Otherwise intracranial circulation widely patent. 10/13: Echocardiogram>>1. Left ventricular ejection fraction, by estimation, is 60 to 65%. The  left ventricle has normal function. The left ventricle has no regional  wall motion abnormalities. Left ventricular diastolic parameters were  normal.   2. Right ventricular systolic function is normal. The right ventricular  size is normal.   3. The mitral valve is normal in structure. No evidence of mitral valve  regurgitation. No evidence of mitral stenosis.   4. The aortic valve was not well visualized. Aortic valve regurgitation  is not visualized. No aortic stenosis is present.   5. The inferior vena cava is normal in size with greater than 50%  respiratory variability, suggesting right atrial pressure of 3 mmHg. 10/13: EEG>>This EEG is consistent with  the  patient's sedated state. There was no seizure or seizure predisposition recorded on this study. Please note that lack of epileptiform activity on EEG does not preclude the possibility of epilepsy  Micro Data:  10/13: SARS-CoV-2 PCR> negative 10/13: Influenza PCR> negative 10/13: HIV Screen>>non reactive 10/13: Blood culture x2> ngtd 10/13: Urine Culture> not collected 10/13: MRSA PCR>>   Antimicrobials:  None  OBJECTIVE  Blood pressure 132/79, pulse 93, temperature 99.3 F (37.4 C), temperature source Esophageal, resp. rate (!) 40, height _0  (1.854 m), weight 99.8 kg, SpO2 100 %.    Vent Mode: PRVC FiO2 (%):  [30 %] 30 % Set Rate:  [20 bmp] 20 bmp Vt Set:  [640 mL] 640 mL PEEP:  [5 cmH20] 5 cmH20 Plateau Pressure:  [14 cmH20] 14 cmH20   Intake/Output Summary (Last 24 hours) at 11/27/2020 0902 Last data filed at 11/27/2020 3491 Gross per 24 hour  Intake 5094.2 ml  Output 1940 ml  Net 3154.2 ml    Filed Weights   11/25/20 2303 11/26/20 0500 11/27/20 0106  Weight: 107 kg 96.9 kg 99.8 kg   Physical Examination  GENERAL: Acutely ill-appearing male, laying in bed, intubated, awake for SBT, mildly anxious with sedation lowered EYES: Pupils equal, round, reactive to light and accommodation. No scleral icterus.  HEENT: Head atraumatic, normocephalic.  ET tube in place NECK:  Supple, no jugular venous distention. No thyroid enlargement, no tenderness.  LUNGS: Clear breath sounds bilaterally, no wheezing, rales,rhonchi or crepitation. No use of accessory muscles of respiration.  CARDIOVASCULAR: Tachycardia, regular rhythm, S1, S2.  No murmurs, rubs, or gallops.  ABDOMEN: Soft, nontender, nondistended.  No guarding or rebound tenderness, bowel sounds present. No organomegaly or mass.  EXTREMITIES: No pedal edema, cyanosis, or clubbing.  NEUROLOGIC: Sedation pulse, awake and following simple commands, no focal deficits, pupils PERRLA PSYCHIATRIC: Slightly anxious with sedation  off SKIN: No obvious rash, lesion, or ulcer.   Labs/imaging that I havepersonally reviewed  (right click and "Reselect all SmartList Selections" daily)     Labs   CBC: Recent Labs  Lab 11/25/20 2259 11/26/20 0157 11/26/20 2048 11/27/20 0554  WBC 11.0* 9.9 7.5 8.0  NEUTROABS 6.6  --   --   --   HGB 14.9 13.7 14.0 13.7  HCT 42.1 38.0* 39.1 39.5  MCV 89.4 86.2 89.9 90.6  PLT 246 193 169 181     Basic Metabolic Panel: Recent Labs  Lab 11/25/20 2259 11/26/20 0157 11/26/20 2048 11/27/20 0554  NA 132* 129* 139 139  K 3.2* 3.4* 2.8* 3.7  CL 96* 96* 108 108  CO2 16* _1 GLUCOSE 121* 103* 78 96  BUN <5* <5* <5* <5*  CREATININE 0.96 0.75 0.82 0.85  CALCIUM 9.1 8.4* 8.4* 8.3*  MG 2.2 1.9 2.1 2.1  PHOS  --  3.6  --  3.9    GFR: Estimated Creatinine Clearance: 129.3 mL/min (by C-G formula based on SCr of 0.85 mg/dL). Recent Labs  Lab 11/25/20 2259 11/26/20 0013 11/26/20 0157 11/26/20 2048 11/27/20 0554  PROCALCITON <0.10  --   --   --   --   WBC 11.0*  --  9.9 7.5 8.0  LATICACIDVEN  --  2.3* 1.2 1.4  --      Liver Function Tests: Recent Labs  Lab 11/25/20 2259  AST 67*  ALT 29  ALKPHOS 49  BILITOT 0.9  PROT 7.7  ALBUMIN 4.5    No results for input(s): LIPASE, AMYLASE in the  last 168 hours. No results for input(s): AMMONIA in the last 168 hours.  ABG    Component Value Date/Time   PHART 7.39 11/26/2020 0107   PCO2ART 42 11/26/2020 0107   PO2ART 127 (H) 11/26/2020 0107   HCO3 25.4 11/26/2020 0107   ACIDBASEDEF 13.7 (H) 11/25/2020 2300   O2SAT 98.9 11/26/2020 0107      Coagulation Profile: Recent Labs  Lab 11/25/20 2259 11/26/20 0157  INR 1.1 1.0     Cardiac Enzymes: Recent Labs  Lab 11/25/20 2259 11/26/20 2048 11/27/20 0554  CKTOTAL 2,004* 901* 604*    HbA1C: Hgb A1c MFr Bld  Date/Time Value Ref Range Status  11/26/2020 01:57 AM 5.8 (H) 4.8 - 5.6 % Final    Comment:    (NOTE)         Prediabetes: 5.7 - 6.4          Diabetes: >6.4         Glycemic control for adults with diabetes: <7.0     CBG: Recent Labs  Lab 11/26/20 1928 11/26/20 2016 11/26/20 2324 11/27/20 0332 11/27/20 0714  GLUCAP 59* 99 83 100* 96    Review of Systems:   Unable to obtain due to patient being intubated, mechanically ventilated and sedated  Past Medical History  He,  has a past medical history of Bipolar 1 disorder ([place]), COPD (chronic obstructive pulmonary disease) ([place]), GERD (gastroesophageal reflux disease), High cholesterol, Hypertension, Schizophrenia ([place]), and Stroke ([place]).   Surgical History    Past Surgical History:  Procedure Laterality Date   CERVICAL SPINE SURGERY     4   HARDWARE REMOVAL Left 04/20/2015   Procedure: HARDWARE REMOVAL left leg;  Surgeon: Earnestine Leys, MD;  Location: [facility] ORS;  Service: Orthopedics;  Laterality: Left;   KNEE SURGERY Left    SHOULDER SURGERY Left      Social History   reports that he has been smoking cigarettes. He has never used smokeless tobacco. He reports that he does not drink alcohol and does not use drugs.   Family History   His family history includes Cancer in his maternal aunt and maternal uncle; Diabetes in his maternal aunt, maternal uncle, and mother; Hyperlipidemia in his maternal aunt, maternal uncle, and mother; Hypertension in his maternal aunt, maternal uncle, and mother.   Allergies Allergies  Allergen Reactions   Acetaminophen Other (See Comments)    Pt states that it makes him hyper. Pt states he takes tylenol when needed.      Home Medications  Prior to Admission medications   Medication Sig Start Date End Date Taking? Authorizing Provider  amLODipine (NORVASC) 5 MG tablet TAKE 1 TABLET BY MOUTH DAILY. Patient not taking: Reported on 11/25/2020 04/12/16   Arnetha Courser, MD  aspirin 81 MG tablet Take 81 mg by mouth. Patient not taking: Reported on 11/25/2020 06/17/10   [provider]  atorvastatin (LIPITOR) 20 MG tablet TAKE 1  TABLET BY MOUTH AT BEDTIME. Patient not taking: No sig reported 07/20/16   Arnetha Courser, MD  diazepam (VALIUM) 10 MG tablet Take 5 mg by mouth 4 (four) times daily as needed.  Patient not taking: Reported on 11/25/2020 07/11/13   [provider]  Diclofenac Sodium 3 % GEL Place 1 application every 12 (twelve) hours as needed onto the skin. Patient not taking: Reported on 11/25/2020 12/24/16   Orbie Pyo, MD  doxycycline (VIBRAMYCIN) 100 MG capsule Take 1 capsule (100 mg total) by mouth 2 (two) times daily.  Patient not taking: Reported on 11/25/2020 12/08/16   Orbie Pyo, MD  DULoxetine (CYMBALTA) 60 MG capsule Take 60 mg by mouth 2 (two) times daily.  Patient not taking: Reported on 11/25/2020    [provider]  fluticasone (FLOVENT HFA) 110 MCG/ACT inhaler Inhale 2 puffs into the lungs 2 (two) times daily. Patient not taking: Reported on 11/25/2020 05/17/16   Arnetha Courser, MD  levothyroxine (SYNTHROID, LEVOTHROID) 50 MCG tablet TAKE 1 TABLET BY MOUTH DAILY. Patient not taking: No sig reported 07/20/16   Arnetha Courser, MD  meloxicam (MOBIC) 15 MG tablet Take 15 mg by mouth daily. Patient not taking: Reported on 11/25/2020    [provider]  omeprazole (PRILOSEC) 20 MG capsule Take 1 capsule (20 mg total) by mouth daily. Patient not taking: Reported on 11/25/2020 01/05/16   Arnetha Courser, MD  potassium chloride (KLOR-CON 10) 10 MEQ tablet One by mouth twice a day for three days, then one a day until finished Patient not taking: No sig reported 01/06/16   Arnetha Courser, MD  predniSONE (DELTASONE) 50 MG tablet Take 1 tab PO Daily Patient not taking: Reported on 11/25/2020 12/08/16   Orbie Pyo, MD  PROVENTIL HFA 108 [phone] Base) MCG/ACT inhaler INHALE 2 PUFFS INTO THE LUNGS EVERY FOUR HOURS AS NEEDED FOR WHEEZING OR SHORTNESS OF BREATH Patient not taking: Reported on 11/25/2020 01/04/17   Arnetha Courser, MD  QUEtiapine  (SEROQUEL) 100 MG tablet Take 100-200 mg by mouth 4 (four) times daily. Pt takes one tablet three times a day and two tablets at bedtime. Patient not taking: Reported on 11/25/2020    [provider]  ranitidine (ZANTAC) 150 MG tablet TAKE 1 TABLET BY MOUTH 2 TIMES DAILY FOR HEARTBURN ,REFLUX Patient not taking: Reported on 11/25/2020 09/15/16   Arnetha Courser, MD  Scheduled Meds:  aspirin  325 mg Per Tube Daily   budesonide (PULMICORT) nebulizer solution  0.25 mg Nebulization BID   Chlorhexidine Gluconate Cloth  6 each Topical Daily   docusate  100 mg Per Tube BID   feeding supplement (PROSource TF)  45 mL Per Tube QID   free water  30 mL Per Tube Q4H   multivitamin  15 mL Per Tube Daily   pantoprazole (PROTONIX) IV  40 mg Intravenous QHS   polyethylene glycol  17 g Per Tube Daily   potassium chloride  40 mEq Per Tube BID   Continuous Infusions:  sodium chloride Stopped (11/26/20 2301)   dextrose 5 % and 0.45% NaCl 125 mL/hr at 11/27/20 0727   feeding supplement (VITAL AF 1.2 CAL) Stopped (11/27/20 0745)   fentaNYL infusion INTRAVENOUS Stopped (11/27/20 0820)   levETIRAcetam Stopped (11/26/20 2327)   midazolam Stopped (11/27/20 0821)   propofol (DIPRIVAN) infusion Stopped (11/27/20 0815)   PRN Meds:.docusate, fentaNYL (SUBLIMAZE) injection, fentaNYL (SUBLIMAZE) injection, ipratropium-albuterol, midazolam, midazolam, polyethylene glycol  Active Hospital Problem list   Acute hypoxic respiratory failure Seizures without known seizure disorder Acute CVA AGMA Rhabdomyolysis Acute metabolic encephalopathy Polysubstance abuse  Assessment & Plan:  Acute Hypoxic Respiratory Failure in the setting of Seizure Activity & Acute Metabolic Encephalopathy PMHx: COPD -Full vent support, implement lung protective strategies -Plateau pressures less than 30 cm H20 -Wean FiO2 & PEEP as tolerated to maintain O2 sats >92% -Follow intermittent Chest X-ray & ABG as needed -Spontaneous  Breathing Trials when respiratory parameters met and mental status permits -Implement VAP Bundle -Prn Bronchodilators -Budesonide nebs  Acute Metabolic Encephalopathy Seizure  without known Seizure Disorder, Likely provoked? ? Subacute Left Cerebellar Stroke on CT Head Sedation needs in setting of mechanical ventilation PMHx of:  CVA / TIA, Polysubstance abuse, Bipolar disorder, paranoid schizophrenia -Maintain a RASS goal of  0 to -1 -Fentanyl, Versed, Propofol as needed to maintain RASS goal -Avoid sedating medications as able -Daily wake up assessment -Seizure precautions - Patient loaded with 1 g Keppra in the ED.  Continue maintenance dose 500 mg twice daily pending neurology recommendations - Neurology following, appreciate input -Plan for MRI Cervical Spine 10/14 as per Neurology -EEG negative -MRI Brain 10/13 negative  -Hgb A1c & fasting lipid panel pending -NPO until RN stroke swallow screen -PT / OT / Speech consult -Echocardiogram normal -UDS + for Marijuana -Psych consulted for medication assistance, appreciate input  Rhabdomyolysis  -Monitor I&O's / urinary output -Follow BMP -Ensure adequate renal perfusion -Avoid nephrotoxic agents as able -Replace electrolytes as indicated -IV fluids -Trend CK    Best practice:  Diet:  NPO Pain/Anxiety/Delirium protocol (if indicated): Yes (RASS goal -1) VAP protocol (if indicated): Yes DVT prophylaxis: SCD GI prophylaxis: PPI Glucose control:  SSI No Central venous access:  N/A Arterial line:  N/A Foley:  Yes, and it is still needed Mobility:  bed rest  PT consulted: N/A Last date of multidisciplinary goals of care discussion [10/14] Code Status:  full code Disposition: ICU   Critical care time: 40 minutes    Darel Hong, AGACNP-BC [facility] epic messenger for cross cover needs If after hours, please call E-link   .

## 2020-11-27 NOTE — Plan of Care (Signed)

## 2020-11-27 NOTE — Consult Note (Signed)
PHARMACY CONSULT NOTE  Pharmacy Consult for Electrolyte Monitoring and Replacement   Recent Labs: Potassium (mmol/L)  Date Value  11/27/2020 3.7  06/08/2014 3.7   Magnesium (mg/dL)  Date Value  72/90/2111 2.1   Calcium (mg/dL)  Date Value  55/20/8022 8.3 (L)   Calcium, Total (mg/dL)  Date Value  33/61/2244 9.3   Albumin (g/dL)  Date Value  97/53/0051 4.5  06/05/2014 4.5   Phosphorus (mg/dL)  Date Value  12/05/1171 3.9   Sodium (mmol/L)  Date Value  11/27/2020 139  06/05/2014 135   Assessment: Patient is a 50 y/o M with medical history including bipolar disorder, polysubstance abuse, chronic pain associated with psychosocial dysfunction, lumbar canal stenosis, HLD, schizophrenia, CVA, HTN, COPD is admitted with seizure like activity. Patient was ultimately intubated and is now extubated but remains in the ICU. Pharmacy consulted to assist with electrolyte monitoring and replacement as indicated.  Nutrition: Vital 1.2 @45ml /hr + ProSource TF 73ml QID via tube  MIVF: D51/2NS at 125 mL/hr  Goal of Therapy:  Electrolytes within normal limits  Plan:  40 mEq po KCl once daily ordered by NP: continue Will follow-up electrolytes with AM labs tomorrow  48m 11/27/2020 7:16 AM

## 2020-11-27 NOTE — Progress Notes (Signed)
Nutrition Follow Up Note   DOCUMENTATION CODES:   Not applicable  INTERVENTION:   RD will add supplements once pt's diet is advanced   NUTRITION DIAGNOSIS:   Inadequate oral intake related to inability to eat (pt sedated and ventilated) as evidenced by NPO status.  GOAL:   Patient will meet greater than or equal to 90% of their needs -previously met with tube feeds   MONITOR:   Diet advancement, Labs, Weight trends, Skin, I & O's  ASSESSMENT:   50 y/o male with h/o COPD, bipolar disorder, substance abuse, HTN, chronic pain and GERD who admitted with shoulder pain and seizure requiring intubation and ventilation. 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  Pt extubated today. SLP evaluation pending. RD will add supplements once pt's diet is advanced. Refeed labs stable. Per chart, pt is weight stable since admission.   Medications reviewed and include: aspirin, colace, MVI, protonix, miralax, KCl, NaCl w/ 5% dextrose @125ml /hr  Labs reviewed: K 3.7 wnl, BUN <5(L), P 3.9 wnl, Mg 2.1 wnl  Diet Order:   Diet Order             DIET DYS 3 Room service appropriate? Yes; Fluid consistency: Thin  Diet effective now                  EDUCATION NEEDS:   No education needs have been identified at this time  Skin:  Skin Assessment: Reviewed RN Assessment  Last BM:  pta  Height:   Ht Readings from Last 1 Encounters:  11/25/20 6' 1"  (1.854 m)    Weight:   Wt Readings from Last 1 Encounters:  11/27/20 99.8 kg    Ideal Body Weight:  83.6 kg  BMI:  Body mass index is 29.03 kg/m.  Estimated Nutritional Needs:   Kcal:  2300-2600kcal/day  Protein:  115-130g/day  Fluid:  2.5-2.8L/day  Koleen Distance MS, RD, LDN Please refer to [facility] for RD and/or RD on-call/weekend/after hours pager

## 2020-11-27 NOTE — Progress Notes (Signed)
Patient extremely hyperactive, removing monitoring equipment and attempting to get out of bed. Repeated call  request. PRN Haldol given. Patient speech became more scattered and bizarre. Patient having runs of Vent Bigeminy. NP made aware. Will continue to monitor.

## 2020-11-27 NOTE — Progress Notes (Signed)
SLP Cancellation Note  Patient Details Name: Scott Howell MRN: [id number] DOB: [birth year]   Cancelled treatment:       Reason Eval/Treat Not Completed: SLP screened, no needs identified, will sign off (chart reviewed; consulted NSG re: pt's status, spoke w/ pt). Per Psychiatry, Mental Status is slowly improving. Pt has a Baseline of Bipolar Dis. and Schizophrenia w/ Polysubstance Abuse at admit.  Pt has told MD that he needs to "get back on the old medicine that he used to take a couple years ago" to feel better. At admit, pt was agitated, thrashing about and very aggressive requiring multiple people to hold him down. He was subsequently intubated for airway protection; orally intubated <1 day and now extubated. NSG performed the Bedside swallow screen which he passed; pt is now on a mech soft diet w/ thin liquids for ease of mastication of cut meats d/t Missing Dentition.  In room, pt was awake and alert but somewhat min anxious w/ constant speech/verbalizations. NSG reported same and that pt was min impulsive overall. At admit, pt was agitated, thrashing about and very aggressive requiring multiple people to hold him down. He was subsequently intubated for airway protection; orally intubated <1 day and now extubated. NSG performed the Bedside swallow screen which he passed; pt is now on a mech soft diet w/ thin liquids for ease of mastication of cut meats d/t Missing Dentition.  As pt is able make wants/needs appropriately and he is communicating appropriately w/ NSG and MDs, Cognitive status appears improved. NSG and chart notes suspect pt is returning to his baseline -- unsure of pt's Baseline Cognitive awareness and any decline. Suspect pt will benefit from Supervision for Safety awareness at Discharge as per OT evaluation noted. Will hold on Cognitive-linguistic eval at this time. MD to reconsult if new needs arise while admitted.      Jerilynn Som, MS, CCC-SLP Speech Language  Pathologist Rehab Services [phone] [facility] 11/27/2020, 4:23 PM

## 2020-11-27 NOTE — Progress Notes (Signed)
0745 pt alert following commands 310 residual TF stopped for possible extubation 0820 pt extubated to [facility]

## 2020-11-27 NOTE — Progress Notes (Signed)
Patient has had poor UOP this shift, right at 35ml/hr. Dark, brown in color. NP made aware.NS bolus initiated per order.

## 2020-11-27 NOTE — Progress Notes (Signed)
Pt was suctioned fpr a moderate amount of thick tan secretions. Per NP's order, he was extubated and placed on a 2L nasal cannula.

## 2020-11-27 NOTE — Progress Notes (Signed)
PHARMACIST - PHYSICIAN COMMUNICATION  DR:   Jayme Cloud  CONCERNING: IV to Oral Route Change Policy  RECOMMENDATION: This patient is receiving pantoprazole by the intravenous route.  Based on criteria approved by the Pharmacy and Therapeutics Committee, the intravenous medication(s) is/are being converted to the equivalent oral dose form(s).   DESCRIPTION: These criteria include: The patient is eating (either orally or via tube) and/or has been taking other orally administered medications for a least 24 hours The patient has no evidence of active gastrointestinal bleeding or impaired GI absorption (gastrectomy, short bowel, patient on TNA or NPO).  If you have questions about this conversion, please contact the Pharmacy Department  []   [phone] )  ( [phone] [x]   [phone] )  [facility] []   [phone] )  [facility] []   [phone] )  [facility] []   [phone] )  [facility]   ( [phone], [facility] 11/27/2020 7:57 PM

## 2020-11-27 NOTE — Evaluation (Signed)
Physical Therapy Evaluation Patient Details Name: Scott Howell MRN: [id number] DOB: [birth year] Today's Date: 11/27/2020  History of Present Illness  Scott Howell is a 50 y.o. male with past medical history of COPD, bipolar disorder, schizophrenia, HTN, HDL, CVA and GERD who presents via EMS on 10/12 for reportedly some shoulder pain. Enroute had a seizure and was violent post ictal. He was in acute distress on arrival to the ED and was subsequently intubated. Pt extubated 10/14.   Clinical Impression  Pt is a pleasant 50 year old male who presents to PT/OT co- evaluation after admission for seizures like activity. Prior to admission, pt reports that he was modified independent for all his ADLs with intermittent usage of RW and/or SPC, he does not drive, and is on disability. Upon evaluation, pt requiring SPV for bed mobility, CGA transfers due to slight impulsivity and CGA for ambulation in room. Pt demonstrating some mild balance deficits without usage of any assistive devices. Pt on 2L of oxygen and remained >95% throughout evaluation and no complaints of dizziness with position changes. Recommending home with HHPT to reduce fall risk in the home once medically ready for discharge. Will continue to see patient during acute hospitalization to maximize mobility and promote independence.     Recommendations for follow up therapy are one component of a multi-disciplinary discharge planning process, led by the attending physician.  Recommendations may be updated based on patient status, additional functional criteria and insurance authorization.  Follow Up Recommendations Home health PT;Supervision - Intermittent    Equipment Recommendations  None recommended by PT    Recommendations for Other Services       Precautions / Restrictions Precautions Precautions: None Restrictions Weight Bearing Restrictions: No      Mobility  Bed Mobility Overal bed mobility: Needs Assistance Bed  Mobility: Supine to Sit     Supine to sit: Supervision     General bed mobility comments: SPV due to impulsivity    Transfers Overall transfer level: Needs assistance Equipment used: Rolling walker (2 wheeled) Transfers: Sit to/from Stand Sit to Stand: Min guard         General transfer comment: CGA for sit to stand from bed surface. No apparent static standing balance impairements noted  Ambulation/Gait Ambulation/Gait assistance: Min guard Gait Distance (Feet): 5 Feet Assistive device: None Gait Pattern/deviations: Step-to pattern;Decreased stride length;Narrow base of support     General Gait Details: Increased lateral sway without assistive device. Able to take small steps backwards with CGA and verbal cues  Stairs            Wheelchair Mobility    Modified Rankin (Stroke Patients Only)       Balance Overall balance assessment: Needs assistance Sitting-balance support: Feet supported;No upper extremity supported Sitting balance-Leahy Scale: Normal     Standing balance support: No upper extremity supported;During functional activity Standing balance-Leahy Scale: Fair Standing balance comment: Increased lateral sway with dynamic balance activities.              Pertinent Vitals/Pain Pain Assessment: No/denies pain    Home Living Family/patient expects to be discharged to:: Private residence Living Arrangements: Alone Available Help at Discharge: Family Type of Home:  (pt lives in a camper on land by his mom and step dads house) Home Access: Stairs to enter Entrance Stairs-Rails: Right Entrance Stairs-Number of Steps: 1 Home Layout: One level Home Equipment: Environmental consultant - 2 wheels;Cane - quad Additional Comments: Pt reports that he lives on the property of  his mother's house in a camper. He reports that he has to go inside her house to shower and have BMs. Pt reports that he family could help intermittently if he needed it    Prior Function Level  of Independence: Independent with assistive device(s)         Comments: Reports that he uses RW and SPC at baseline for mobility; Pt reports that he does not drive and is disabled so he does not work     Higher education careers adviser   Dominant Hand: Right    Extremity/Trunk Assessment   Upper Extremity Assessment Upper Extremity Assessment: RUE deficits/detail RUE Deficits / Details: spasticity noted in R digits, wrist    Lower Extremity Assessment Lower Extremity Assessment: Overall WFL for tasks assessed       Communication   Communication: No difficulties  Cognition Arousal/Alertness: Awake/alert Behavior During Therapy: Impulsive Overall Cognitive Status: No family/caregiver present to determine baseline cognitive functioning          General Comments: Pt impulsive throughout evaluation; requires vcs for pacing during mobility and ADL tasks      General Comments      Exercises     Assessment/Plan    PT Assessment Patient needs continued PT services  PT Problem List Decreased strength;Decreased range of motion;Decreased activity tolerance;Decreased balance;Decreased mobility;Decreased coordination;Decreased cognition;Decreased knowledge of use of DME       PT Treatment Interventions DME instruction;Gait training;Stair training;Functional mobility training;Therapeutic activities;Therapeutic exercise;Balance training;Neuromuscular re-education;Cognitive remediation;Patient/family education    PT Goals (Current goals can be found in the Care Plan section)  Acute Rehab PT Goals Patient Stated Goal: to go home PT Goal Formulation: With patient Time For Goal Achievement: 12/11/20 Potential to Achieve Goals: Good    Frequency Min 2X/week   Barriers to discharge        Co-evaluation PT/OT/SLP Co-Evaluation/Treatment: Yes Reason for Co-Treatment: Complexity of the patient's impairments (multi-system involvement);Necessary to address cognition/behavior during functional  activity PT goals addressed during session: Mobility/safety with mobility OT goals addressed during session: ADL's and self-care       AM-PAC PT "6 Clicks" Mobility  Outcome Measure Help needed turning from your back to your side while in a flat bed without using bedrails?: None Help needed moving from lying on your back to sitting on the side of a flat bed without using bedrails?: None Help needed moving to and from a bed to a chair (including a wheelchair)?: A Little Help needed standing up from a chair using your arms (e.g., wheelchair or bedside chair)?: A Little Help needed to walk in hospital room?: A Little Help needed climbing 3-5 steps with a railing? : A Lot 6 Click Score: 19    End of Session Equipment Utilized During Treatment: Gait belt;Oxygen Activity Tolerance: Patient tolerated treatment well Patient left: in chair;with call bell/phone within reach;with chair alarm set Nurse Communication: Mobility status PT Visit Diagnosis: Unsteadiness on feet (R26.81);Other abnormalities of gait and mobility (R26.89)    Time: 1003-1036 PT Time Calculation (min) (ACUTE ONLY): 33 min   Charges:   PT Evaluation $PT Eval Moderate Complexity: 1 Mod PT Treatments $Therapeutic Activity: 8-22 mins        Verl Blalock, SPT   Verl Blalock 11/27/2020, 12:42 PM

## 2020-11-27 NOTE — Progress Notes (Signed)
NAME:  Scott Howell, MRN:  [id number], DOB:  15-Aug-1970, LOS: 1 ADMISSION DATE:  11/25/2020, CONSULTATION DATE:  11/26/2020 REFERRING MD: Pryor Curia, MD CHIEF COMPLAINT:  Seizures   HPI  50 y.o with significant PMH of bipolar disorder, polysubstance abuse, chronic pain associated with significant psychosocial dysfunction, lumbar canal stenosis, hyperlipidemia, schizophrenia, CVA, hypertension, and COPD who presented to the ED with witnessed seizures like activity.  Per ED reports, EMS was apparently called out for shoulder and back pain.  On EMS arrival, patient suddenly began to posture followed by seizure-like activity lasting less than 30 seconds.  He was also noted to be bradycardic in the 30s that resolved prior to medication administration.  There were no reports of loss of  bladder function or tongue biting.  Patient was noted to be postictal and very difficult to redirect violently thrashing and attempting to get out of the stretcher.  ED Course: On arrival to the ED, he was afebrile with blood pressure 204/81 mm Hg and pulse rate 158 beats/min, RR 43 breaths per minute, oxygen saturation 100% on nonrebreather.  She was noted to be in acute distress, diaphoretic and toxic appearing.  He was agitated, thrashing about and very aggressive requiring multiple people to hold him down.  Patient was given 6 mg grams of Versed without any effect.  He was subsequently intubated for airway protection.   Labs/Diagnostics WBC/Hgb/Hct/Plts:  8.0/13.7/39.5/181 (10/14 0554)  Na+ 132, K+ 3.2, CO2 16, glucose 121, AST 67/ALT 29, anion gap 20 UA: No evidence of UTI UDS: Positive for marijuana Venous Blood Gas result:  pO2 232; pCO2 39; pH 7.17;  HCO3 14.2, %O2 Sat 99.7 Lactate: 2.3 CK: 2004 EKG: Sinus tachycardia with a ventricular rate of 151 bpm with ventricular bigeminy and multiple nonspecific changes. CXR: No evidence of focal consolidation, overt edema, pneumothorax, effusion or other acute  cardiopulmonary process.  PCCM consulted for admission to ICU and medical optimization. Past Medical History  bipolar disorder polysubstance abuse chronic pain associated with significant psychosocial dysfunction lumbar canal stenosis Hyperlipidemia Schizophrenia CVA Hypertension COPD  Significant Hospital Events   10/13: Admitted to ICU with seizure-like activity 10/14: Successfully extubated, Psych consulted, plan for MRI Cervical spine as per Neurology  Consults:  Neurology  Procedures:  10/13: Intubation  Significant Diagnostic Tests:  10/13: Chest Xray>No focal consolidation. No pulmonary edema. No pleural effusion. No pneumothorax 10/13: Noncontrast CT head>Possible subacute infarct within the left cerebellar hemisphere without associated mass effect or superimposed acute intracranial hemorrhage 10/13: MRI Brain/ MRA Head>>IMPRESSION: 1. Negative for acute infarct 2. Mild white matter changes most likely due to chronic microvascular ischemia 3. Small distal right vertebral artery which may be due to acquired atherosclerotic stenosis versus congenital hypoplasia. Otherwise intracranial circulation widely patent. 10/13: Echocardiogram>>1. Left ventricular ejection fraction, by estimation, is 60 to 65%. The  left ventricle has normal function. The left ventricle has no regional  wall motion abnormalities. Left ventricular diastolic parameters were  normal.   2. Right ventricular systolic function is normal. The right ventricular  size is normal.   3. The mitral valve is normal in structure. No evidence of mitral valve  regurgitation. No evidence of mitral stenosis.   4. The aortic valve was not well visualized. Aortic valve regurgitation  is not visualized. No aortic stenosis is present.   5. The inferior vena cava is normal in size with greater than 50%  respiratory variability, suggesting right atrial pressure of 3 mmHg. 10/13: EEG>>This EEG is consistent with  the  patient's sedated state. There was no seizure or seizure predisposition recorded on this study. Please note that lack of epileptiform activity on EEG does not preclude the possibility of epilepsy  Micro Data:  10/13: SARS-CoV-2 PCR> negative 10/13: Influenza PCR> negative 10/13: HIV Screen>>non reactive 10/13: Blood culture x2> ngtd 10/13: Urine Culture> not collected 10/13: MRSA PCR>>   Antimicrobials:  None  OBJECTIVE  Blood pressure 139/73, pulse 90, temperature 97.9 F (36.6 C), temperature source Axillary, resp. rate (!) 39, height $RemoveBe'6\' 1"'roFJpiKKT$  (1.854 m), weight 99.8 kg, SpO2 96 %.    Vent Mode: PRVC FiO2 (%):  [30 %] 30 % Set Rate:  [20 bmp] 20 bmp Vt Set:  [640 mL] 640 mL PEEP:  [5 cmH20] 5 cmH20 Plateau Pressure:  [14 cmH20] 14 cmH20   Intake/Output Summary (Last 24 hours) at 11/27/2020 1341 Last data filed at 11/27/2020 1341 Gross per 24 hour  Intake 5331.96 ml  Output 3725 ml  Net 1606.96 ml    Filed Weights   11/25/20 2303 11/26/20 0500 11/27/20 0106  Weight: 107 kg 96.9 kg 99.8 kg   Physical Examination  GENERAL: Acutely ill-appearing male, laying in bed, intubated, awake for SBT, mildly anxious with sedation lowered EYES: Pupils equal, round, reactive to light and accommodation. No scleral icterus.  HEENT: Head atraumatic, normocephalic.  ET tube in place NECK:  Supple, no jugular venous distention. No thyroid enlargement, no tenderness.  LUNGS: Clear breath sounds bilaterally, no wheezing, rales,rhonchi or crepitation. No use of accessory muscles of respiration.  CARDIOVASCULAR: Tachycardia, regular rhythm, S1, S2.  No murmurs, rubs, or gallops.  ABDOMEN: Soft, nontender, nondistended.  No guarding or rebound tenderness, bowel sounds present. No organomegaly or mass.  EXTREMITIES: No pedal edema, cyanosis, or clubbing.  NEUROLOGIC: Sedation pulse, awake and following simple commands, no focal deficits, pupils PERRLA PSYCHIATRIC: Slightly anxious with sedation  off SKIN: No obvious rash, lesion, or ulcer.   Labs/imaging that I havepersonally reviewed  (right click and "Reselect all SmartList Selections" daily)     Labs   CBC: Recent Labs  Lab 11/25/20 2259 11/26/20 0157 11/26/20 2048 11/27/20 0554  WBC 11.0* 9.9 7.5 8.0  NEUTROABS 6.6  --   --   --   HGB 14.9 13.7 14.0 13.7  HCT 42.1 38.0* 39.1 39.5  MCV 89.4 86.2 89.9 90.6  PLT 246 193 169 181     Basic Metabolic Panel: Recent Labs  Lab 11/25/20 2259 11/26/20 0157 11/26/20 2048 11/27/20 0554  NA 132* 129* 139 139  K 3.2* 3.4* 2.8* 3.7  CL 96* 96* 108 108  CO2 16* $Remov'25 25 25  'BnAKKC$ GLUCOSE 121* 103* 78 96  BUN <5* <5* <5* <5*  CREATININE 0.96 0.75 0.82 0.85  CALCIUM 9.1 8.4* 8.4* 8.3*  MG 2.2 1.9 2.1 2.1  PHOS  --  3.6  --  3.9    GFR: Estimated Creatinine Clearance: 129.3 mL/min (by C-G formula based on SCr of 0.85 mg/dL). Recent Labs  Lab 11/25/20 2259 11/26/20 0013 11/26/20 0157 11/26/20 2048 11/27/20 0554  PROCALCITON <0.10  --   --   --   --   WBC 11.0*  --  9.9 7.5 8.0  LATICACIDVEN  --  2.3* 1.2 1.4  --      Liver Function Tests: Recent Labs  Lab 11/25/20 2259  AST 67*  ALT 29  ALKPHOS 49  BILITOT 0.9  PROT 7.7  ALBUMIN 4.5    No results for input(s): LIPASE, AMYLASE in the  last 168 hours. No results for input(s): AMMONIA in the last 168 hours.  ABG    Component Value Date/Time   PHART 7.39 11/26/2020 0107   PCO2ART 42 11/26/2020 0107   PO2ART 127 (H) 11/26/2020 0107   HCO3 25.4 11/26/2020 0107   ACIDBASEDEF 13.7 (H) 11/25/2020 2300   O2SAT 98.9 11/26/2020 0107      Coagulation Profile: Recent Labs  Lab 11/25/20 2259 11/26/20 0157  INR 1.1 1.0     Cardiac Enzymes: Recent Labs  Lab 11/25/20 2259 11/26/20 2048 11/27/20 0554  CKTOTAL 2,004* 901* 604*     HbA1C: Hgb A1c MFr Bld  Date/Time Value Ref Range Status  11/26/2020 01:57 AM 5.8 (H) 4.8 - 5.6 % Final    Comment:    (NOTE)         Prediabetes: 5.7 - 6.4          Diabetes: >6.4         Glycemic control for adults with diabetes: <7.0     CBG: Recent Labs  Lab 11/26/20 2016 11/26/20 2324 11/27/20 0332 11/27/20 0714 11/27/20 1126  GLUCAP 99 83 100* 96 114*     Review of Systems:   Unable to obtain due to patient being intubated, mechanically ventilated and sedated  Past Medical History  He,  has a past medical history of Bipolar 1 disorder ([place]), COPD (chronic obstructive pulmonary disease) ([place]), GERD (gastroesophageal reflux disease), High cholesterol, Hypertension, Schizophrenia ([place]), and Stroke ([place]).   Surgical History    Past Surgical History:  Procedure Laterality Date   CERVICAL SPINE SURGERY     4   HARDWARE REMOVAL Left 04/20/2015   Procedure: HARDWARE REMOVAL left leg;  Surgeon: Earnestine Leys, MD;  Location: [facility] ORS;  Service: Orthopedics;  Laterality: Left;   KNEE SURGERY Left    SHOULDER SURGERY Left      Social History   reports that he has been smoking cigarettes. He has never used smokeless tobacco. He reports that he does not drink alcohol and does not use drugs.   Family History   His family history includes Cancer in his maternal aunt and maternal uncle; Diabetes in his maternal aunt, maternal uncle, and mother; Hyperlipidemia in his maternal aunt, maternal uncle, and mother; Hypertension in his maternal aunt, maternal uncle, and mother.   Allergies Allergies  Allergen Reactions   Acetaminophen Other (See Comments)    Pt states that it makes him hyper. Pt states he takes tylenol when needed.      Home Medications  Prior to Admission medications   Medication Sig Start Date End Date Taking? Authorizing Provider  amLODipine (NORVASC) 5 MG tablet TAKE 1 TABLET BY MOUTH DAILY. Patient not taking: Reported on 11/25/2020 04/12/16   Arnetha Courser, MD  aspirin 81 MG tablet Take 81 mg by mouth. Patient not taking: Reported on 11/25/2020 06/17/10   [provider]  atorvastatin (LIPITOR) 20 MG tablet TAKE 1  TABLET BY MOUTH AT BEDTIME. Patient not taking: No sig reported 07/20/16   Arnetha Courser, MD  diazepam (VALIUM) 10 MG tablet Take 5 mg by mouth 4 (four) times daily as needed.  Patient not taking: Reported on 11/25/2020 07/11/13   [provider]  Diclofenac Sodium 3 % GEL Place 1 application every 12 (twelve) hours as needed onto the skin. Patient not taking: Reported on 11/25/2020 12/24/16   Orbie Pyo, MD  doxycycline (VIBRAMYCIN) 100 MG capsule Take 1 capsule (100 mg total) by mouth 2 (two)  times daily. Patient not taking: Reported on 11/25/2020 12/08/16   Orbie Pyo, MD  DULoxetine (CYMBALTA) 60 MG capsule Take 60 mg by mouth 2 (two) times daily.  Patient not taking: Reported on 11/25/2020    [provider]  fluticasone (FLOVENT HFA) 110 MCG/ACT inhaler Inhale 2 puffs into the lungs 2 (two) times daily. Patient not taking: Reported on 11/25/2020 05/17/16   Arnetha Courser, MD  levothyroxine (SYNTHROID, LEVOTHROID) 50 MCG tablet TAKE 1 TABLET BY MOUTH DAILY. Patient not taking: No sig reported 07/20/16   Arnetha Courser, MD  meloxicam (MOBIC) 15 MG tablet Take 15 mg by mouth daily. Patient not taking: Reported on 11/25/2020    [provider]  omeprazole (PRILOSEC) 20 MG capsule Take 1 capsule (20 mg total) by mouth daily. Patient not taking: Reported on 11/25/2020 01/05/16   Arnetha Courser, MD  potassium chloride (KLOR-CON 10) 10 MEQ tablet One by mouth twice a day for three days, then one a day until finished Patient not taking: No sig reported 01/06/16   Arnetha Courser, MD  predniSONE (DELTASONE) 50 MG tablet Take 1 tab PO Daily Patient not taking: Reported on 11/25/2020 12/08/16   Orbie Pyo, MD  PROVENTIL HFA 108 [phone] Base) MCG/ACT inhaler INHALE 2 PUFFS INTO THE LUNGS EVERY FOUR HOURS AS NEEDED FOR WHEEZING OR SHORTNESS OF BREATH Patient not taking: Reported on 11/25/2020 01/04/17   Arnetha Courser, MD  QUEtiapine  (SEROQUEL) 100 MG tablet Take 100-200 mg by mouth 4 (four) times daily. Pt takes one tablet three times a day and two tablets at bedtime. Patient not taking: Reported on 11/25/2020    [provider]  ranitidine (ZANTAC) 150 MG tablet TAKE 1 TABLET BY MOUTH 2 TIMES DAILY FOR HEARTBURN ,REFLUX Patient not taking: Reported on 11/25/2020 09/15/16   Arnetha Courser, MD  Scheduled Meds:  aspirin  325 mg Per Tube Daily   budesonide (PULMICORT) nebulizer solution  0.25 mg Nebulization BID   Chlorhexidine Gluconate Cloth  6 each Topical Daily   docusate  100 mg Per Tube BID   enoxaparin (LOVENOX) injection  40 mg Subcutaneous Q24H   multivitamin  15 mL Per Tube Daily   pantoprazole (PROTONIX) IV  40 mg Intravenous QHS   polyethylene glycol  17 g Per Tube Daily   potassium chloride  40 mEq Per Tube BID   Continuous Infusions:  dextrose 5 % and 0.45% NaCl 125 mL/hr at 11/27/20 1300   levETIRAcetam Stopped (11/27/20 1220)   PRN Meds:.diazepam, docusate, ipratropium-albuterol, polyethylene glycol  Active Hospital Problem list   Acute hypoxic respiratory failure Seizures without known seizure disorder Acute CVA AGMA Rhabdomyolysis Acute metabolic encephalopathy Polysubstance abuse  Assessment & Plan:  Acute Hypoxic Respiratory Failure in the setting of Seizure Activity & Acute Metabolic Encephalopathy PMHx: COPD -Full vent support, implement lung protective strategies -Plateau pressures less than 30 cm H20 -Wean FiO2 & PEEP as tolerated to maintain O2 sats >92% -Follow intermittent Chest X-ray & ABG as needed -Spontaneous Breathing Trials when respiratory parameters met and mental status permits -Implement VAP Bundle -Prn Bronchodilators -Budesonide nebs  Acute Metabolic Encephalopathy Seizure without known Seizure Disorder, Likely provoked? ? Subacute Left Cerebellar Stroke on CT Head Sedation needs in setting of mechanical ventilation PMHx of:  CVA / TIA, Polysubstance  abuse, Bipolar disorder, paranoid schizophrenia -Maintain a RASS goal of  0 to -1 -Fentanyl, Versed, Propofol as needed to maintain RASS goal -Avoid sedating medications as able -Daily  wake up assessment -Seizure precautions - Patient loaded with 1 g Keppra in the ED.  Continue maintenance dose 500 mg twice daily pending neurology recommendations - Neurology following, appreciate input -Plan for MRI Cervical Spine 10/14 as per Neurology -EEG negative -MRI Brain 10/13 negative  -Hgb A1c & fasting lipid panel pending -NPO until RN stroke swallow screen -PT / OT / Speech consult -Echocardiogram normal -UDS + for Marijuana -Psych consulted for medication assistance, appreciate input  Rhabdomyolysis  -Monitor I&O's / urinary output -Follow BMP -Ensure adequate renal perfusion -Avoid nephrotoxic agents as able -Replace electrolytes as indicated -IV fluids -Trend CK    Best practice:  Diet:  NPO Pain/Anxiety/Delirium protocol (if indicated): Yes (RASS goal -1) VAP protocol (if indicated): Yes DVT prophylaxis: SCD GI prophylaxis: PPI Glucose control:  SSI No Central venous access:  N/A Arterial line:  N/A Foley:  Yes, and it is still needed Mobility:  bed rest  PT consulted: N/A Last date of multidisciplinary goals of care discussion [10/14] Code Status:  full code Disposition: ICU   Critical care time: 40 minutes    Darel Hong, AGACNP-BC [facility] epic messenger for cross cover needs If after hours, please call E-link   .

## 2020-11-28 ENCOUNTER — Other Ambulatory Visit: Payer: Self-pay

## 2020-11-28 ENCOUNTER — Inpatient Hospital Stay: Payer: Medicaid Other

## 2020-11-28 ENCOUNTER — Encounter: Payer: Self-pay | Admitting: Internal Medicine

## 2020-11-28 DIAGNOSIS — R569 Unspecified convulsions: Secondary | ICD-10-CM | POA: Diagnosis not present

## 2020-11-28 DIAGNOSIS — R4182 Altered mental status, unspecified: Secondary | ICD-10-CM | POA: Diagnosis not present

## 2020-11-28 DIAGNOSIS — F3160 Bipolar disorder, current episode mixed, unspecified: Secondary | ICD-10-CM | POA: Diagnosis not present

## 2020-11-28 LAB — CBC
HCT: 37.8 % — ABNORMAL LOW (ref 39.0–52.0)
Hemoglobin: 12.8 g/dL — ABNORMAL LOW (ref 13.0–17.0)
MCH: 30.6 pg (ref 26.0–34.0)
MCHC: 33.9 g/dL (ref 30.0–36.0)
MCV: 90.4 fL (ref 80.0–100.0)
Platelets: 179 10*3/uL (ref 150–400)
RBC: 4.18 MIL/uL — ABNORMAL LOW (ref 4.22–5.81)
RDW: 13.6 % (ref 11.5–15.5)
WBC: 9.3 10*3/uL (ref 4.0–10.5)
nRBC: 0 % (ref 0.0–0.2)

## 2020-11-28 LAB — MAGNESIUM: Magnesium: 1.8 mg/dL (ref 1.7–2.4)

## 2020-11-28 LAB — BASIC METABOLIC PANEL
Anion gap: 11 (ref 5–15)
BUN: <5 mg/dL — ABNORMAL LOW (ref 6–20)
CO2: 22 mmol/L (ref 22–32)
Calcium: 8.8 mg/dL — ABNORMAL LOW (ref 8.9–10.3)
Chloride: 105 mmol/L (ref 98–111)
Creatinine, Ser: 0.68 mg/dL (ref 0.61–1.24)
GFR, Estimated: >60 mL/min (ref >60)
Glucose, Bld: 82 mg/dL (ref 70–99)
Potassium: 3.7 mmol/L (ref 3.5–5.1)
Sodium: 138 mmol/L (ref 135–145)

## 2020-11-28 LAB — PHOSPHORUS: Phosphorus: 4.1 mg/dL (ref 2.5–4.6)

## 2020-11-28 LAB — CK: Total CK: 2801 U/L — ABNORMAL HIGH (ref 49–397)

## 2020-11-28 MED ORDER — LACTATED RINGERS IV BOLUS
1000.0000 mL | Freq: Once | INTRAVENOUS | Status: AC
  Administered 2020-11-28: 1000 mL via INTRAVENOUS

## 2020-11-28 MED ORDER — ASPIRIN EC 325 MG PO TBEC
325.0000 mg | DELAYED_RELEASE_TABLET | Freq: Every day | ORAL | Status: DC
  Administered 2020-11-29 – 2020-11-30 (×2): 325 mg via ORAL
  Filled 2020-11-28 (×2): qty 1

## 2020-11-28 MED ORDER — PREGABALIN 50 MG PO CAPS
50.0000 mg | ORAL_CAPSULE | Freq: Three times a day (TID) | ORAL | Status: DC
  Administered 2020-11-28 – 2020-11-30 (×5): 50 mg via ORAL
  Filled 2020-11-28 (×5): qty 1

## 2020-11-28 MED ORDER — IBUPROFEN 400 MG PO TABS
400.0000 mg | ORAL_TABLET | Freq: Four times a day (QID) | ORAL | Status: DC | PRN
  Administered 2020-11-28 – 2020-11-29 (×3): 400 mg via ORAL
  Filled 2020-11-28 (×3): qty 1

## 2020-11-28 MED ORDER — PREGABALIN 50 MG PO CAPS
100.0000 mg | ORAL_CAPSULE | Freq: Three times a day (TID) | ORAL | Status: DC
  Administered 2020-11-28: 100 mg via ORAL
  Filled 2020-11-28: qty 2

## 2020-11-28 MED ORDER — LEVETIRACETAM 500 MG PO TABS
500.0000 mg | ORAL_TABLET | Freq: Two times a day (BID) | ORAL | Status: DC
  Filled 2020-11-28: qty 1

## 2020-11-28 MED ORDER — MAGNESIUM SULFATE 2 GM/50ML IV SOLN
2.0000 g | Freq: Once | INTRAVENOUS | Status: AC
  Administered 2020-11-28: 2 g via INTRAVENOUS
  Filled 2020-11-28: qty 50

## 2020-11-28 NOTE — Progress Notes (Signed)
Progress Note    Scott Howell  VOH:[id number] DOB: 18-Sep-1970  DOA: 11/25/2020 PCP: Kerman Passey, MD      Brief Narrative:    Medical records reviewed and are as summarized below:  Scott Howell is a 50 y.o. male with medical history significant for bipolar disorder, schizophrenia, polysubstance abuse, lumbar canal stenosis, chronic pain, hyperlipidemia, hypertension, stroke, COPD.  He was brought to the hospital because of witnessed seizure-like activity.  Reportedly, patient had another seizure-like activity in the emergency room lasting about 30 seconds.  He became bradycardic with heart rate in the 30s.  He also became postictal and was apparently violent and agitated.  He was admitted to the ICU for seizures.  He was intubated for acute hypoxemic respiratory failure.  He was treated with IV Keppra and diazepam.  He was evaluated by the neurologist and psychiatrist.  He was successfully extubated on 11/27/2020 and transferred to the hospitalist service on 11/28/2020.      Assessment/Plan:   Principal Problem:   Bipolar 1 disorder, mixed (HCC) Active Problems:   Seizure (HCC)   Nutrition Problem: Inadequate oral intake Etiology: inability to eat (pt sedated and ventilated)  Signs/Symptoms: NPO status   Body mass index is 26.5 kg/m.   Seizure, acute metabolic encephalopathy: Continue IV Keppra.  No evidence of acute stroke on MRI brain.  Follow-up with neurologist.  Rhabdomyolysis: CK level has gone from 604 to 2,801.  Continue IV fluids.  Bipolar disorder, schizophrenia: Continue psychotropics.  Follow-up with psychiatrist.  Acute hypoxemic respiratory failure: S/p extubation on 11/28/2018.  He is tolerating room air.  Transfer from stepdown unit to MedSurg.  Diet Order             DIET DYS 3 Room service appropriate? Yes; Fluid consistency: Thin  Diet effective now                       Consultants: Neurologist Psychiatrist Intensivist  Procedures: Intubation and mechanical ventilation on 11/26/2020    Medications:    aspirin  325 mg Per Tube Daily   budesonide (PULMICORT) nebulizer solution  0.25 mg Nebulization BID   Chlorhexidine Gluconate Cloth  6 each Topical Daily   diazepam  5 mg Oral TID   docusate sodium  200 mg Oral BID   enoxaparin (LOVENOX) injection  40 mg Subcutaneous Q24H   feeding supplement  237 mL Oral TID BM   multivitamin with minerals  1 tablet Oral Daily   pantoprazole  40 mg Oral QHS   potassium chloride  40 mEq Oral BID   QUEtiapine  100 mg Oral TID   Continuous Infusions:  lactated ringers 150 mL/hr at 11/28/20 1000   levETIRAcetam Stopped (11/27/20 2358)     Anti-infectives (From admission, onward)    None              Family Communication/Anticipated D/C date and plan/Code Status   DVT prophylaxis: enoxaparin (LOVENOX) injection 40 mg Start: 11/27/20 2000 SCDs Start: 11/26/20 0028     Code Status: Full Code  Family Communication: None Disposition Plan:    Status is: Inpatient  Remains inpatient appropriate because: Cognitive impairment, seizure           Subjective:   Interval events noted.  He has no complaints.  Objective:    Vitals:   11/28/20 0700 11/28/20 0727 11/28/20 0809 11/28/20 1000  BP: (!) 109/54   138/77  Pulse: 75   (!)  106  Resp: (!) 26   (!) 24  Temp:  99.1 F (37.3 C)    TempSrc:  Oral    SpO2: 94%  97% 97%  Weight:      Height:       No data found.   Intake/Output Summary (Last 24 hours) at 11/28/2020 1135 Last data filed at 11/28/2020 1100 Gross per 24 hour  Intake 6431.75 ml  Output 7850 ml  Net -1418.25 ml   Filed Weights   11/26/20 0500 11/27/20 0106 11/28/20 0309  Weight: 96.9 kg 99.8 kg 91.1 kg    Exam:  GEN: NAD, sitting up in the chair SKIN: No rash EYES: EOMI ENT: MMM CV: RRR PULM: CTA B ABD: soft, ND, NT, +BS CNS: AAO x 2  (person and place), non focal EXT: No edema or tenderness PSYCH: Anxious, rapid and pressured speech.  He is impulsive and has poor judgment.       Data Reviewed:   I have personally reviewed following labs and imaging studies:  Labs: Labs show the following:   Basic Metabolic Panel: Recent Labs  Lab 11/25/20 2259 11/26/20 0157 11/26/20 2048 11/27/20 0554 11/28/20 0515  NA 132* 129* 139 139 138  K 3.2* 3.4* 2.8* 3.7 3.7  CL 96* 96* 108 108 105  CO2 16* 25 25 25 22   GLUCOSE 121* 103* 78 96 82  BUN <5* <5* <5* <5* <5*  CREATININE 0.96 0.75 0.82 0.85 0.68  CALCIUM 9.1 8.4* 8.4* 8.3* 8.8*  MG 2.2 1.9 2.1 2.1 1.8  PHOS  --  3.6  --  3.9 4.1   GFR Estimated Creatinine Clearance: 124.8 mL/min (by C-G formula based on SCr of 0.68 mg/dL). Liver Function Tests: Recent Labs  Lab 11/25/20 2259  AST 67*  ALT 29  ALKPHOS 49  BILITOT 0.9  PROT 7.7  ALBUMIN 4.5   No results for input(s): LIPASE, AMYLASE in the last 168 hours. No results for input(s): AMMONIA in the last 168 hours. Coagulation profile Recent Labs  Lab 11/25/20 2259 11/26/20 0157  INR 1.1 1.0    CBC: Recent Labs  Lab 11/25/20 2259 11/26/20 0157 11/26/20 2048 11/27/20 0554 11/28/20 0515  WBC 11.0* 9.9 7.5 8.0 9.3  NEUTROABS 6.6  --   --   --   --   HGB 14.9 13.7 14.0 13.7 12.8*  HCT 42.1 38.0* 39.1 39.5 37.8*  MCV 89.4 86.2 89.9 90.6 90.4  PLT 246 193 169 181 179   Cardiac Enzymes: Recent Labs  Lab 11/25/20 2259 11/26/20 2048 11/27/20 0554 11/28/20 0515  CKTOTAL 2,004* 901* 604* 2,801*   BNP (last 3 results) No results for input(s): PROBNP in the last 8760 hours. CBG: Recent Labs  Lab 11/26/20 2324 11/27/20 0332 11/27/20 0714 11/27/20 1126 11/27/20 1522  GLUCAP 83 100* 96 114* 119*   D-Dimer: No results for input(s): DDIMER in the last 72 hours. Hgb A1c: Recent Labs    11/26/20 0157  HGBA1C 5.8*   Lipid Profile: Recent Labs    11/26/20 0157  CHOL 120  HDL 30*   LDLCALC 68  TRIG 107  112  CHOLHDL 4.0   Thyroid function studies: Recent Labs    11/25/20 2259  TSH 3.259   Anemia work up: No results for input(s): VITAMINB12, FOLATE, FERRITIN, TIBC, IRON, RETICCTPCT in the last 72 hours. Sepsis Labs: Recent Labs  Lab 11/25/20 2259 11/26/20 0013 11/26/20 0157 11/26/20 2048 11/27/20 0554 11/28/20 0515  PROCALCITON <0.10  --   --   --   --   --  WBC 11.0*  --  9.9 7.5 8.0 9.3  LATICACIDVEN  --  2.3* 1.2 1.4  --   --     Microbiology Recent Results (from the past 240 hour(s))  Resp Panel by RT-PCR (Flu A&B, Covid)     Status: None   Collection Time: 11/25/20 10:59 PM   Specimen: Nasopharyngeal(NP) swabs in vial transport medium  Result Value Ref Range Status   SARS Coronavirus 2 by RT PCR NEGATIVE NEGATIVE Final    Comment: (NOTE) SARS-CoV-2 target nucleic acids are NOT DETECTED.  The SARS-CoV-2 RNA is generally detectable in upper respiratory specimens during the acute phase of infection. The lowest concentration of SARS-CoV-2 viral copies this assay can detect is 138 copies/mL. A negative result does not preclude SARS-Cov-2 infection and should not be used as the sole basis for treatment or other patient management decisions. A negative result may occur with  improper specimen collection/handling, submission of specimen other than nasopharyngeal swab, presence of viral mutation(s) within the areas targeted by this assay, and inadequate number of viral copies(<138 copies/mL). A negative result must be combined with clinical observations, patient history, and epidemiological information. The expected result is Negative.  Fact Sheet for Patients:  BloggerCourse.com  Fact Sheet for Healthcare Providers:  SeriousBroker.it  This test is no t yet approved or cleared by the [place] FDA and  has been authorized for detection and/or diagnosis of SARS-CoV-2 by FDA under an  Emergency Use Authorization (EUA). This EUA will remain  in effect (meaning this test can be used) for the duration of the COVID-19 declaration under Section 564(b)(1) of the Act, 21 U.S.C.section 360bbb-3(b)(1), unless the authorization is terminated  or revoked sooner.       Influenza A by PCR NEGATIVE NEGATIVE Final   Influenza B by PCR NEGATIVE NEGATIVE Final    Comment: (NOTE) The Xpert Xpress SARS-CoV-2/FLU/RSV plus assay is intended as an aid in the diagnosis of influenza from Nasopharyngeal swab specimens and should not be used as a sole basis for treatment. Nasal washings and aspirates are unacceptable for Xpert Xpress SARS-CoV-2/FLU/RSV testing.  Fact Sheet for Patients: BloggerCourse.com  Fact Sheet for Healthcare Providers: SeriousBroker.it  This test is not yet approved or cleared by the [place] FDA and has been authorized for detection and/or diagnosis of SARS-CoV-2 by FDA under an Emergency Use Authorization (EUA). This EUA will remain in effect (meaning this test can be used) for the duration of the COVID-19 declaration under Section 564(b)(1) of the Act, 21 U.S.C. section 360bbb-3(b)(1), unless the authorization is terminated or revoked.  Performed at [facility], [address] [place], [place] [address]   Culture, blood (single) w Reflex to ID Panel     Status: None (Preliminary result)   Collection Time: 11/26/20  1:57 AM   Specimen: BLOOD  Result Value Ref Range Status   Specimen Description BLOOD LEFT HAND  Final   Special Requests   Final    BOTTLES DRAWN AEROBIC AND ANAEROBIC Blood Culture results may not be optimal due to an excessive volume of blood received in culture bottles   Culture   Final    NO GROWTH 2 DAYS Performed at [facility], [address], [place], [place] [address]    Report Status PENDING  Incomplete  MRSA Next Gen by PCR, Nasal     Status: None    Collection Time: 11/26/20  9:10 PM   Specimen: Nasal Mucosa; Nasal Swab  Result Value Ref Range Status  MRSA by PCR Next Gen NOT DETECTED NOT DETECTED Final    Comment: (NOTE) The GeneXpert MRSA Assay (FDA approved for NASAL specimens only), is one component of a comprehensive MRSA colonization surveillance program. It is not intended to diagnose MRSA infection nor to guide or monitor treatment for MRSA infections. Test performance is not FDA approved in patients less than 4 years old. Performed at [facility], [address] [place], [place] [address]     Procedures and diagnostic studies:  DG Abd 1 View  Result Date: 11/26/2020 CLINICAL DATA:  Orogastric tube placement EXAM: ABDOMEN - 1 VIEW COMPARISON:  None. FINDINGS: Limited radiograph of the lower chest and upper abdomen was obtained for the purposes of enteric tube localization. Enteric tube is seen coursing below the diaphragm with distal tip and side port terminating within the expected location of the gastric body. IMPRESSION: Enteric tube within the gastric body. Electronically Signed   By: Duanne Guess D.O.   On: 11/26/2020 12:17   MR ANGIO HEAD WO CONTRAST  Result Date: 11/26/2020 CLINICAL DATA:  Stroke.  Altered mental status, seizure EXAM: MRI HEAD WITHOUT CONTRAST MRA HEAD WITHOUT CONTRAST TECHNIQUE: Multiplanar, multi-echo pulse sequences of the brain and surrounding structures were acquired without intravenous contrast. Angiographic images of the Circle of Willis were acquired using MRA technique without intravenous contrast. COMPARISON:  CT head 11/25/2020 FINDINGS: MRI HEAD FINDINGS Brain: Negative for acute infarct.  Normal cerebellum. Ventricle size normal. Negative for hemorrhage or mass. Mild white matter changes with few small subcortical white matter hyperintensities in the right frontal lobe. Vascular: Normal arterial flow voids at the skull base. Skull and upper cervical spine: Negative  Sinuses/Orbits: Mild mucosal edema paranasal sinuses. Negative orbit Other: None MRA HEAD FINDINGS Anterior circulation: Internal carotid artery widely patent. Anterior and middle cerebral arteries widely patent without stenosis or vascular malformation Posterior circulation: Left vertebral artery dominant widely patent. Left PICA patent. Right PICA patent. Distal to PICA, the right vertebral artery is atretic which may be congenital or due to stenosis which is severe. Basilar widely patent. Superior cerebellar and posterior cerebral arteries patent bilaterally without stenosis or vascular malformation. Anatomic variants: None IMPRESSION: 1. Negative for acute infarct 2. Mild white matter changes most likely due to chronic microvascular ischemia 3. Small distal right vertebral artery which may be due to acquired atherosclerotic stenosis versus congenital hypoplasia. Otherwise intracranial circulation widely patent. Electronically Signed   By: Marlan Palau M.D.   On: 11/26/2020 14:38   MR BRAIN WO CONTRAST  Result Date: 11/26/2020 CLINICAL DATA:  Stroke.  Altered mental status, seizure EXAM: MRI HEAD WITHOUT CONTRAST MRA HEAD WITHOUT CONTRAST TECHNIQUE: Multiplanar, multi-echo pulse sequences of the brain and surrounding structures were acquired without intravenous contrast. Angiographic images of the Circle of Willis were acquired using MRA technique without intravenous contrast. COMPARISON:  CT head 11/25/2020 FINDINGS: MRI HEAD FINDINGS Brain: Negative for acute infarct.  Normal cerebellum. Ventricle size normal. Negative for hemorrhage or mass. Mild white matter changes with few small subcortical white matter hyperintensities in the right frontal lobe. Vascular: Normal arterial flow voids at the skull base. Skull and upper cervical spine: Negative Sinuses/Orbits: Mild mucosal edema paranasal sinuses. Negative orbit Other: None MRA HEAD FINDINGS Anterior circulation: Internal carotid artery widely patent.  Anterior and middle cerebral arteries widely patent without stenosis or vascular malformation Posterior circulation: Left vertebral artery dominant widely patent. Left PICA patent. Right PICA patent. Distal to PICA, the right vertebral artery is atretic which may  be congenital or due to stenosis which is severe. Basilar widely patent. Superior cerebellar and posterior cerebral arteries patent bilaterally without stenosis or vascular malformation. Anatomic variants: None IMPRESSION: 1. Negative for acute infarct 2. Mild white matter changes most likely due to chronic microvascular ischemia 3. Small distal right vertebral artery which may be due to acquired atherosclerotic stenosis versus congenital hypoplasia. Otherwise intracranial circulation widely patent. Electronically Signed   By: Marlan Palau M.D.   On: 11/26/2020 14:38   US Carotid Bilateral (at [facility] and [facility] only)  Result Date: 11/27/2020 CLINICAL DATA:  50 year old male with a history of stroke EXAM: BILATERAL CAROTID DUPLEX ULTRASOUND TECHNIQUE: Wallace Cullens scale imaging, color Doppler and duplex ultrasound were performed of bilateral carotid and vertebral arteries in the neck. COMPARISON:  None. FINDINGS: Criteria: Quantification of carotid stenosis is based on velocity parameters that correlate the residual internal carotid diameter with NASCET-based stenosis levels, using the diameter of the distal internal carotid lumen as the denominator for stenosis measurement. The following velocity measurements were obtained: RIGHT ICA:  Systolic 101 cm/sec, Diastolic 44 cm/sec CCA:  118 cm/sec SYSTOLIC ICA/CCA RATIO:  0.9 ECA:  146 cm/sec LEFT ICA:  Systolic 98 cm/sec, Diastolic 43 cm/sec CCA:  77 cm/sec SYSTOLIC ICA/CCA RATIO:  1.4 ECA:  136 cm/sec Right Brachial SBP: Not acquired Left Brachial SBP: Not acquired RIGHT CAROTID ARTERY: No significant calcified disease of the right common carotid artery. Intermediate waveform maintained. Heterogeneous plaque without  significant calcifications at the right carotid bifurcation. Low resistance waveform of the right ICA. No significant tortuosity. RIGHT VERTEBRAL ARTERY: Antegrade flow with low resistance waveform. LEFT CAROTID ARTERY: No significant calcified disease of the left common carotid artery. Intermediate waveform maintained. Heterogeneous plaque at the left carotid bifurcation without significant calcifications. Low resistance waveform of the left ICA. LEFT VERTEBRAL ARTERY:  Antegrade flow with low resistance waveform. IMPRESSION: Color duplex indicates minimal heterogeneous plaque, with no hemodynamically significant stenosis by duplex criteria in the extracranial cerebrovascular circulation. Signed, Yvone Neu. Reyne Dumas, RPVI [facility] Radiology Electronically Signed   By: Gilmer Mor D.O.   On: 11/27/2020 12:25   EEG adult  Result Date: 11/26/2020 Rejeana Brock, MD     11/26/2020  8:44 PM History: 50 year old male with a history of new onset seizure Sedation: Propofol, fentanyl Technique: This EEG was acquired with electrodes placed according to the International 10-20 electrode system (including Fp1, Fp2, F3, F4, C3, C4, P3, P4, O1, O2, T3, T4, T5, T6, A1, A2, Fz, Cz, Pz). The following electrodes were missing or displaced: none. Background: The background consists of generally distributed 8-9 Hz alpha range activity with some brief 1 second attenuations.  There is also frontocentral predominant beta activity seen throughout recording. Photic stimulation: Physiologic driving is not performed EEG Abnormalities: Sedated EEG Clinical Interpretation: This EEG is consistent with the patient's sedated state. There was no seizure or seizure predisposition recorded on this study. Please note that lack of epileptiform activity on EEG does not preclude the possibility of epilepsy. Ritta Slot, MD [facility] [phone] If 7pm- 7am, please  page neurology on call as listed in AMION.               LOS: 2 days      [facility]   Pager on [email or web address]. If 7PM-7AM, please contact night-coverage at [email or web address]     11/28/2020, 11:35 AM

## 2020-11-28 NOTE — Consult Note (Signed)
PHARMACY CONSULT NOTE  Pharmacy Consult for Electrolyte Monitoring and Replacement   Recent Labs: Potassium (mmol/L)  Date Value  11/28/2020 3.7  06/08/2014 3.7   Magnesium (mg/dL)  Date Value  97/98/9211 1.8   Calcium (mg/dL)  Date Value  94/17/4081 8.8 (L)   Calcium, Total (mg/dL)  Date Value  44/81/8563 9.3   Albumin (g/dL)  Date Value  14/97/0263 4.5  06/05/2014 4.5   Phosphorus (mg/dL)  Date Value  78/58/8502 4.1   Sodium (mmol/L)  Date Value  11/28/2020 138  06/05/2014 135   Assessment: Patient is a 51 y/o M with medical history including bipolar disorder, polysubstance abuse, chronic pain associated with psychosocial dysfunction, lumbar canal stenosis, HLD, schizophrenia, CVA, HTN, COPD is admitted with seizure like activity. Patient was ultimately intubated and is now extubated but remains in the ICU. Pharmacy consulted to assist with electrolyte monitoring and replacement as indicated.  Nutrition: Vital 1.2 @45ml /hr + ProSource TF 56ml QID via tube , ensure TID between meals MIVF: LR at 150 mL/hr  Goal of Therapy:  Electrolytes within normal limits  Plan:  Currently on 40 mEq po KCl once daily ordered by NP Will order Magnesium sulfate 2 gm IV x1 Will follow-up electrolytes with AM labs tomorrow  , A 11/28/2020 9:06 AM

## 2020-11-28 NOTE — Progress Notes (Signed)
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  He is requesting to be restarted on it.  There is no evidence of stroke on MRI.  I would consider this a first unprovoked seizure and therefore would not recommend continuing Keppra, but it is reasonable since he was on Lyrica before for pain, to use this.  Recommendations: 1) Lyrica 50 mg 3 times daily (was on 600 mg/day prior), could increase to 100 3 times daily after a week 2) MRI cervical spine 3) if no acute findings on cervical spine, then neurology will sign off.  Ritta Slot, MD [facility] [phone]  If 7pm- 7am, please page neurology on call as listed in AMION.

## 2020-11-28 NOTE — Consult Note (Signed)
[facility] Face-to-Face Psychiatry Consult   Reason for Consult: Follow-up patient with altered mental status history of bipolar disorder Referring Physician:  Myriam Forehand Patient Identification: Scott Howell MRN:  [id number] Principal Diagnosis: Bipolar 1 disorder, mixed (HCC) Diagnosis:  Principal Problem:   Bipolar 1 disorder, mixed (HCC) Active Problems:   Seizure (HCC)   Total Time spent with patient: 15 minutes  Subjective:   Scott Howell is a 50 y.o. male patient admitted with "I am a lot better".  HPI: Patient seen chart reviewed.  Patient has moved out of the ICU.  He was sitting up out of bed eating regular diet.  Reports that his mood is much better.  Feels more stable.  Denies hallucinations.  No evidence of delirium.  No side effects reported  Past Psychiatric History: History of bipolar disorder  Risk to Self:   Risk to Others:   Prior Inpatient Therapy:   Prior Outpatient Therapy:    Past Medical History:  Past Medical History:  Diagnosis Date   Bipolar 1 disorder (HCC)    COPD (chronic obstructive pulmonary disease) (HCC)    GERD (gastroesophageal reflux disease)    High cholesterol    Hypertension    Schizophrenia (HCC)    Stroke [facility])     Past Surgical History:  Procedure Laterality Date   CERVICAL SPINE SURGERY     4   HARDWARE REMOVAL Left 04/20/2015   Procedure: HARDWARE REMOVAL left leg;  Surgeon: Deeann Saint, MD;  Location: [facility] ORS;  Service: Orthopedics;  Laterality: Left;   KNEE SURGERY Left    SHOULDER SURGERY Left    Family History:  Family History  Problem Relation Age of Onset   Diabetes Mother    Hyperlipidemia Mother    Hypertension Mother    Diabetes Maternal Aunt    Cancer Maternal Aunt    Hyperlipidemia Maternal Aunt    Hypertension Maternal Aunt    Diabetes Maternal Uncle    Cancer Maternal Uncle    Hyperlipidemia Maternal Uncle    Hypertension Maternal Uncle    Family Psychiatric  History: See previous Social History:   Social History   Substance and Sexual Activity  Alcohol Use No   Alcohol/week: 0.0 standard drinks   Comment: Sober for 12 yrs.     Social History   Substance and Sexual Activity  Drug Use No    Social History   Socioeconomic History   Marital status: Single    Spouse name: Not on file   Number of children: Not on file   Years of education: Not on file   Highest education level: Not on file  Occupational History   Not on file  Tobacco Use   Smoking status: Every Day    Types: Cigarettes   Smokeless tobacco: Never  Substance and Sexual Activity   Alcohol use: No    Alcohol/week: 0.0 standard drinks    Comment: Sober for 12 yrs.   Drug use: No   Sexual activity: Never  Other Topics Concern   Not on file  Social History Narrative   Not on file   Social Determinants of Health   Financial Resource Strain: Not on file  Food Insecurity: Not on file  Transportation Needs: Not on file  Physical Activity: Not on file  Stress: Not on file  Social Connections: Not on file   Additional Social History:    Allergies:   Allergies  Allergen Reactions   Acetaminophen Other (See Comments)    Pt states  that it makes him hyper. Pt states he takes tylenol when needed.     Labs:  Results for orders placed or performed during the hospital encounter of 11/25/20 (from the past 48 hour(s))  Glucose, capillary     Status: Abnormal   Collection Time: 11/26/20  7:28 PM  Result Value Ref Range   Glucose-Capillary 59 (L) 70 - 99 mg/dL    Comment: Glucose reference range applies only to samples taken after fasting for at least 8 hours.  Glucose, capillary     Status: None   Collection Time: 11/26/20  8:16 PM  Result Value Ref Range   Glucose-Capillary 99 70 - 99 mg/dL    Comment: Glucose reference range applies only to samples taken after fasting for at least 8 hours.  CBC     Status: None   Collection Time: 11/26/20  8:48 PM  Result Value Ref Range   WBC 7.5 4.0 - 10.5 K/uL    RBC 4.35 4.22 - 5.81 MIL/uL   Hemoglobin 14.0 13.0 - 17.0 g/dL   HCT 85.6 31.4 - 97.0 %   MCV 89.9 80.0 - 100.0 fL   MCH 32.2 26.0 - 34.0 pg   MCHC 35.8 30.0 - 36.0 g/dL   RDW 26.3 78.5 - 88.5 %   Platelets 169 150 - 400 K/uL   nRBC 0.0 0.0 - 0.2 %    Comment: Performed at [facility], [address] [place], [place] [address]  Basic metabolic panel     Status: Abnormal   Collection Time: 11/26/20  8:48 PM  Result Value Ref Range   Sodium 139 135 - 145 mmol/L   Potassium 2.8 (L) 3.5 - 5.1 mmol/L   Chloride 108 98 - 111 mmol/L   CO2 25 22 - 32 mmol/L   Glucose, Bld 78 70 - 99 mg/dL    Comment: Glucose reference range applies only to samples taken after fasting for at least 8 hours.   BUN <5 (L) 6 - 20 mg/dL   Creatinine, Ser 1.28 0.61 - 1.24 mg/dL   Calcium 8.4 (L) 8.9 - 10.3 mg/dL   GFR, Estimated >78 >67 mL/min    Comment: (NOTE) Calculated using the CKD-EPI Creatinine Equation (2021)    Anion gap 6 5 - 15    Comment: Performed at [facility], [address], [place], [place] [address]  Magnesium     Status: None   Collection Time: 11/26/20  8:48 PM  Result Value Ref Range   Magnesium 2.1 1.7 - 2.4 mg/dL    Comment: Performed at [facility], [address] [place], [place] [address]  CK     Status: Abnormal   Collection Time: 11/26/20  8:48 PM  Result Value Ref Range   Total CK 901 (H) 49 - 397 U/L    Comment: Performed at [facility], [address] [place], [place] [address]  Lactic acid, plasma     Status: None   Collection Time: 11/26/20  8:48 PM  Result Value Ref Range   Lactic Acid, Venous 1.4 0.5 - 1.9 mmol/L    Comment: Performed at [facility], [address], [place], [place] [address]  Urine Drug Screen, Qualitative ([facility] only)     Status: Abnormal   Collection Time: 11/26/20  9:10 PM  Result Value Ref Range   Tricyclic, Ur Screen NONE DETECTED NONE DETECTED   Amphetamines, Ur Screen NONE DETECTED  NONE DETECTED   MDMA (Ecstasy)Ur Screen NONE DETECTED  NONE DETECTED   Cocaine Metabolite,Ur [place] NONE DETECTED NONE DETECTED   Opiate, Ur Screen NONE DETECTED NONE DETECTED   Phencyclidine (PCP) Ur S NONE DETECTED NONE DETECTED   Cannabinoid 50 Ng, Ur [place] POSITIVE (A) NONE DETECTED   Barbiturates, Ur Screen NONE DETECTED NONE DETECTED   Benzodiazepine, Ur Scrn POSITIVE (A) NONE DETECTED   Methadone Scn, Ur NONE DETECTED NONE DETECTED    Comment: (NOTE) Tricyclics + metabolites, urine    Cutoff 1000 ng/mL Amphetamines + metabolites, urine  Cutoff 1000 ng/mL MDMA (Ecstasy), urine              Cutoff 500 ng/mL Cocaine Metabolite, urine          Cutoff 300 ng/mL Opiate + metabolites, urine        Cutoff 300 ng/mL Phencyclidine (PCP), urine         Cutoff 25 ng/mL Cannabinoid, urine                 Cutoff 50 ng/mL Barbiturates + metabolites, urine  Cutoff 200 ng/mL Benzodiazepine, urine              Cutoff 200 ng/mL Methadone, urine                   Cutoff 300 ng/mL  The urine drug screen provides only a preliminary, unconfirmed analytical test result and should not be used for non-medical purposes. Clinical consideration and professional judgment should be applied to any positive drug screen result due to possible interfering substances. A more specific alternate chemical method must be used in order to obtain a confirmed analytical result. Gas chromatography / mass spectrometry (GC/MS) is the preferred confirm atory method. Performed at [facility], [address] [place], [place] [address]   MRSA Next Gen by PCR, Nasal     Status: None   Collection Time: 11/26/20  9:10 PM   Specimen: Nasal Mucosa; Nasal Swab  Result Value Ref Range   MRSA by PCR Next Gen NOT DETECTED NOT DETECTED    Comment: (NOTE) The GeneXpert MRSA Assay (FDA approved for NASAL specimens only), is one component of a comprehensive MRSA colonization surveillance program. It is not intended to diagnose  MRSA infection nor to guide or monitor treatment for MRSA infections. Test performance is not FDA approved in patients less than 56 years old. Performed at [facility], [address] [place], [place] [address]   Glucose, capillary     Status: None   Collection Time: 11/26/20 11:24 PM  Result Value Ref Range   Glucose-Capillary 83 70 - 99 mg/dL    Comment: Glucose reference range applies only to samples taken after fasting for at least 8 hours.  Glucose, capillary     Status: Abnormal   Collection Time: 11/27/20  3:32 AM  Result Value Ref Range   Glucose-Capillary 100 (H) 70 - 99 mg/dL    Comment: Glucose reference range applies only to samples taken after fasting for at least 8 hours.  CBC     Status: None   Collection Time: 11/27/20  5:54 AM  Result Value Ref Range   WBC 8.0 4.0 - 10.5 K/uL   RBC 4.36 4.22 - 5.81 MIL/uL   Hemoglobin 13.7 13.0 - 17.0 g/dL   HCT 62.7 03.5 - 00.9 %   MCV 90.6 80.0 - 100.0 fL   MCH 31.4 26.0 - 34.0 pg   MCHC 34.7 30.0 - 36.0 g/dL   RDW 38.1 82.9 - 93.7 %  Platelets 181 150 - 400 K/uL   nRBC 0.0 0.0 - 0.2 %    Comment: Performed at [facility], [address] [place]     Status: Abnormal   Collection Time: 11/27/20  5:54 AM  Result Value Ref Range   Sodium 139 135 - 145 mmol/L   Potassium 3.7 3.5 - 5.1 mmol/L   Chloride 108 98 - 111 mmol/L   CO2 25 22 - 32 mmol/L   Glucose, Bld 96 70 - 99 mg/dL    Comment: Glucose reference range applies only to samples taken after fasting for at least 8 hours.   BUN <5 (L) 6 - 20 mg/dL   Creatinine, Ser 6.04 0.61 - 1.24 mg/dL   Calcium 8.3 (L) 8.9 - 10.3 mg/dL   GFR, Estimated >54 >09 mL/min    Comment: (NOTE) Calculated using the CKD-EPI Creatinine Equation (2021)    Anion gap 6 5 - 15    Comment: Performed at [facility], [address] [place], [place] [address]  CK     Status: Abnormal   Collection Time: 11/27/20  5:54 AM   Result Value Ref Range   Total CK 604 (H) 49 - 397 U/L    Comment: Performed at [facility], [address] [place], [place] [address]  Magnesium     Status: None   Collection Time: 11/27/20  5:54 AM  Result Value Ref Range   Magnesium 2.1 1.7 - 2.4 mg/dL    Comment: Performed at [facility], [address] [place], [place] [address]  Phosphorus     Status: None   Collection Time: 11/27/20  5:54 AM  Result Value Ref Range   Phosphorus 3.9 2.5 - 4.6 mg/dL    Comment: Performed at [facility], [address] [place], [place] [address]  Glucose, capillary     Status: None   Collection Time: 11/27/20  7:14 AM  Result Value Ref Range   Glucose-Capillary 96 70 - 99 mg/dL    Comment: Glucose reference range applies only to samples taken after fasting for at least 8 hours.  Glucose, capillary     Status: Abnormal   Collection Time: 11/27/20 11:26 AM  Result Value Ref Range   Glucose-Capillary 114 (H) 70 - 99 mg/dL    Comment: Glucose reference range applies only to samples taken after fasting for at least 8 hours.  Glucose, capillary     Status: Abnormal   Collection Time: 11/27/20  3:22 PM  Result Value Ref Range   Glucose-Capillary 119 (H) 70 - 99 mg/dL    Comment: Glucose reference range applies only to samples taken after fasting for at least 8 hours.  CBC     Status: Abnormal   Collection Time: 11/28/20  5:15 AM  Result Value Ref Range   WBC 9.3 4.0 - 10.5 K/uL   RBC 4.18 (L) 4.22 - 5.81 MIL/uL   Hemoglobin 12.8 (L) 13.0 - 17.0 g/dL   HCT 84.6 (L) 96.2 - 95.2 %   MCV 90.4 80.0 - 100.0 fL   MCH 30.6 26.0 - 34.0 pg   MCHC 33.9 30.0 - 36.0 g/dL   RDW 84.1 32.4 - 40.1 %   Platelets 179 150 - 400 K/uL   nRBC 0.0 0.0 - 0.2 %    Comment: Performed at [facility], [address] [place], [place] [address]  Basic metabolic panel     Status: Abnormal  Collection Time: 11/28/20  5:15 AM  Result Value Ref Range   Sodium 138 135 - 145  mmol/L   Potassium 3.7 3.5 - 5.1 mmol/L   Chloride 105 98 - 111 mmol/L   CO2 22 22 - 32 mmol/L   Glucose, Bld 82 70 - 99 mg/dL    Comment: Glucose reference range applies only to samples taken after fasting for at least 8 hours.   BUN <5 (L) 6 - 20 mg/dL   Creatinine, Ser 5.36 0.61 - 1.24 mg/dL   Calcium 8.8 (L) 8.9 - 10.3 mg/dL   GFR, Estimated >64 >40 mL/min    Comment: (NOTE) Calculated using the CKD-EPI Creatinine Equation (2021)    Anion gap 11 5 - 15    Comment: Performed at [facility], [address] [place], [place] [address]  CK     Status: Abnormal   Collection Time: 11/28/20  5:15 AM  Result Value Ref Range   Total CK 2,801 (H) 49 - 397 U/L    Comment: Performed at [facility], [address], [place], [place] [address]  Magnesium     Status: None   Collection Time: 11/28/20  5:15 AM  Result Value Ref Range   Magnesium 1.8 1.7 - 2.4 mg/dL    Comment: Performed at [facility], [address] [place], [place] [address]  Phosphorus     Status: None   Collection Time: 11/28/20  5:15 AM  Result Value Ref Range   Phosphorus 4.1 2.5 - 4.6 mg/dL    Comment: Performed at [facility], [address] [place], [place] [address]    Current Facility-Administered Medications  Medication Dose Route Frequency Provider Last Rate Last Admin   [START ON 11/29/2020] aspirin EC tablet 325 mg  325 mg Oral Daily Lurene Shadow, MD       budesonide (PULMICORT) nebulizer solution 0.25 mg  0.25 mg Nebulization BID Jimmye Norman, NP   0.25 mg at 11/28/20 0809   Chlorhexidine Gluconate Cloth 2 % PADS 6 each  6 each Topical Daily Erin Fulling, MD   6 each at 11/28/20 0901   diazepam (VALIUM) injection 5 mg  5 mg Intravenous Q6H PRN Harlon Ditty D, NP   5 mg at 11/27/20 1331   diazepam (VALIUM) tablet 5 mg  5 mg Oral TID , Jackquline Denmark, MD   5 mg at 11/28/20 1525   docusate (COLACE) 50 MG/5ML liquid 100 mg  100 mg Oral BID PRN  Rust-[name], Cecelia Byars, NP       docusate sodium (COLACE) capsule 200 mg  200 mg Oral BID Salena Saner, MD   200 mg at 11/28/20 0900   enoxaparin (LOVENOX) injection 40 mg  40 mg Subcutaneous Q24H Salena Saner, MD   40 mg at 11/27/20 1947   feeding supplement (ENSURE ENLIVE / ENSURE PLUS) liquid 237 mL  237 mL Oral TID BM Erin Fulling, MD   237 mL at 11/28/20 1316   ibuprofen (ADVIL) tablet 400 mg  400 mg Oral Q6H PRN Lurene Shadow, MD   400 mg at 11/28/20 1526   ipratropium-albuterol (DUONEB) 0.5-2.5 (3) MG/3ML nebulizer solution 3 mL  3 mL Nebulization Q6H PRN Jimmye Norman, NP       lactated ringers infusion   Intravenous Continuous Rust-[name], Cecelia Byars, NP 150 mL/hr at 11/28/20 1357 New Bag at 11/28/20 1357   multivitamin with minerals tablet 1 tablet  1 tablet Oral Daily Erin Fulling, MD  1 tablet at 11/28/20 0901   pantoprazole (PROTONIX) EC tablet 40 mg  40 mg Oral QHS Salena Saner, MD   40 mg at 11/27/20 2125   polyethylene glycol (MIRALAX / GLYCOLAX) packet 17 g  17 g Oral Daily PRN Rust-[name], Cecelia Byars, NP       potassium chloride (KLOR-CON) packet 40 mEq  40 mEq Oral BID Rust-[name], Britton L, NP   40 mEq at 11/28/20 0900   pregabalin (LYRICA) capsule 50 mg  50 mg Oral TID Rejeana Brock, MD       QUEtiapine (SEROQUEL) tablet 100 mg  100 mg Oral TID , Jackquline Denmark, MD   100 mg at 11/28/20 1525    Musculoskeletal: Strength & Muscle Tone: within normal limits Gait & Station: normal Patient leans: N/A            Psychiatric Specialty Exam:  Presentation  General Appearance:  No data recorded Eye Contact: No data recorded Speech: No data recorded Speech Volume: No data recorded Handedness: No data recorded  Mood and Affect  Mood: No data recorded Affect: No data recorded  Thought Process  Thought Processes: No data recorded Descriptions of Associations:No data recorded Orientation:No data recorded Thought  Content:No data recorded History of Schizophrenia/Schizoaffective disorder:No data recorded Duration of Psychotic Symptoms:No data recorded Hallucinations:No data recorded Ideas of Reference:No data recorded Suicidal Thoughts:No data recorded Homicidal Thoughts:No data recorded  Sensorium  Memory: No data recorded Judgment: No data recorded Insight: No data recorded  Executive Functions  Concentration: No data recorded Attention Span: No data recorded Recall: No data recorded Fund of Knowledge: No data recorded Language: No data recorded  Psychomotor Activity  Psychomotor Activity: No data recorded  Assets  Assets: No data recorded  Sleep  Sleep: No data recorded  Physical Exam: Physical Exam Vitals and nursing note reviewed.  Constitutional:      Appearance: Normal appearance.  HENT:     Head: Normocephalic and atraumatic.     Mouth/Throat:     Pharynx: Oropharynx is clear.  Eyes:     Pupils: Pupils are equal, round, and reactive to light.  Cardiovascular:     Rate and Rhythm: Normal rate and regular rhythm.  Pulmonary:     Effort: Pulmonary effort is normal.     Breath sounds: Normal breath sounds.  Abdominal:     General: Abdomen is flat.     Palpations: Abdomen is soft.  Musculoskeletal:        General: Normal range of motion.  Skin:    General: Skin is warm and dry.  Neurological:     General: No focal deficit present.     Mental Status: He is alert. Mental status is at baseline.  Psychiatric:        Mood and Affect: Mood normal.        Thought Content: Thought content normal.   Review of Systems  Constitutional: Negative.   HENT: Negative.    Eyes: Negative.   Respiratory: Negative.    Cardiovascular: Negative.   Gastrointestinal: Negative.   Musculoskeletal: Negative.   Skin: Negative.   Neurological: Negative.   Psychiatric/Behavioral: Negative.    Blood pressure (!) 157/99, pulse 88, temperature 98.2 F (36.8 C), temperature  source Oral, resp. rate 17, height  (1.854 m), weight 91.1 kg, SpO2 94 %. Body mass index is 26.5 kg/m.  Treatment Plan Summary: Plan continue current psychiatric medicine.  No changes necessary today.  Supportive encouragement.  Certainly needs to make sure  he has appropriate psychiatric follow-up at discharge.  Disposition: No evidence of imminent risk to self or others at present.   Patient does not meet criteria for psychiatric inpatient admission.  Mordecai Rasmussen, MD 11/28/2020 4:47 PM

## 2020-11-28 NOTE — Progress Notes (Signed)
1340 report given RN for room 110 1350 paged pysch Dr to see if we can DC the NIHSS since MRI was neg 1351 called patient mother and updated on room change

## 2020-11-29 DIAGNOSIS — R569 Unspecified convulsions: Secondary | ICD-10-CM | POA: Diagnosis not present

## 2020-11-29 DIAGNOSIS — F3160 Bipolar disorder, current episode mixed, unspecified: Secondary | ICD-10-CM | POA: Diagnosis not present

## 2020-11-29 LAB — CBC
HCT: 38.1 % — ABNORMAL LOW (ref 39.0–52.0)
Hemoglobin: 13 g/dL (ref 13.0–17.0)
MCH: 30.1 pg (ref 26.0–34.0)
MCHC: 34.1 g/dL (ref 30.0–36.0)
MCV: 88.2 fL (ref 80.0–100.0)
Platelets: 195 10*3/uL (ref 150–400)
RBC: 4.32 MIL/uL (ref 4.22–5.81)
RDW: 13.4 % (ref 11.5–15.5)
WBC: 6.8 10*3/uL (ref 4.0–10.5)
nRBC: 0 % (ref 0.0–0.2)

## 2020-11-29 LAB — BASIC METABOLIC PANEL
Anion gap: 7 (ref 5–15)
BUN: 9 mg/dL (ref 6–20)
CO2: 26 mmol/L (ref 22–32)
Calcium: 9.3 mg/dL (ref 8.9–10.3)
Chloride: 106 mmol/L (ref 98–111)
Creatinine, Ser: 0.71 mg/dL (ref 0.61–1.24)
GFR, Estimated: >60 mL/min (ref >60)
Glucose, Bld: 100 mg/dL — ABNORMAL HIGH (ref 70–99)
Potassium: 3.9 mmol/L (ref 3.5–5.1)
Sodium: 139 mmol/L (ref 135–145)

## 2020-11-29 LAB — CK: Total CK: 3845 U/L — ABNORMAL HIGH (ref 49–397)

## 2020-11-29 LAB — MAGNESIUM: Magnesium: 2 mg/dL (ref 1.7–2.4)

## 2020-11-29 MED ORDER — NICOTINE 21 MG/24HR TD PT24
21.0000 mg | MEDICATED_PATCH | Freq: Every day | TRANSDERMAL | Status: DC
  Administered 2020-11-29 – 2020-11-30 (×2): 21 mg via TRANSDERMAL
  Filled 2020-11-29: qty 1

## 2020-11-29 MED ORDER — PREGABALIN 50 MG PO CAPS: 100.0000 mg | ORAL_CAPSULE | Freq: Three times a day (TID) | ORAL | Status: DC

## 2020-11-29 MED ORDER — MELOXICAM 7.5 MG PO TABS
7.5000 mg | ORAL_TABLET | Freq: Every day | ORAL | Status: DC
  Administered 2020-11-29 – 2020-11-30 (×2): 7.5 mg via ORAL
  Filled 2020-11-29 (×2): qty 1

## 2020-11-29 MED ORDER — QUETIAPINE FUMARATE 200 MG PO TABS
200.0000 mg | ORAL_TABLET | Freq: Three times a day (TID) | ORAL | Status: DC
  Administered 2020-11-29 – 2020-11-30 (×3): 200 mg via ORAL
  Filled 2020-11-29 (×2): qty 8
  Filled 2020-11-29 (×5): qty 1

## 2020-11-29 MED ORDER — DIAZEPAM 5 MG PO TABS
5.0000 mg | ORAL_TABLET | Freq: Two times a day (BID) | ORAL | Status: DC
  Administered 2020-11-29 – 2020-11-30 (×3): 5 mg via ORAL
  Filled 2020-11-29 (×3): qty 1

## 2020-11-29 NOTE — Consult Note (Signed)
[facility] Face-to-Face Psychiatry Consult   Reason for Consult: Follow-up consult 50 year old man with history of bipolar disorder Referring Physician:  Myriam Forehand Patient Identification: Scott Howell MRN:  [id number] Principal Diagnosis: Bipolar 1 disorder, mixed (HCC) Diagnosis:  Principal Problem:   Bipolar 1 disorder, mixed (HCC) Active Problems:   Seizure (HCC)   Total Time spent with patient: 30 minutes  Subjective:   Scott Howell is a 50 y.o. male patient admitted with "I am having a lot of pain".  HPI: Patient seen for follow-up.  He was up in a chair again today eating.  Patient was alert and oriented.  Having a little bit of pressure to his speech.  Did not seem obviously psychotic but a little hyper.  No threatening behavior no violence no suicidality.  Says he has only slept a couple hours last night and is still feeling irritable.  A lot of his complaints are about his chronic body pain.  Past Psychiatric History: Past history of bipolar disorder  Risk to Self:   Risk to Others:   Prior Inpatient Therapy:   Prior Outpatient Therapy:    Past Medical History:  Past Medical History:  Diagnosis Date   Bipolar 1 disorder (HCC)    COPD (chronic obstructive pulmonary disease) (HCC)    GERD (gastroesophageal reflux disease)    High cholesterol    Hypertension    Schizophrenia (HCC)    Stroke [facility])     Past Surgical History:  Procedure Laterality Date   CERVICAL SPINE SURGERY     4   HARDWARE REMOVAL Left 04/20/2015   Procedure: HARDWARE REMOVAL left leg;  Surgeon: Deeann Saint, MD;  Location: [facility] ORS;  Service: Orthopedics;  Laterality: Left;   KNEE SURGERY Left    SHOULDER SURGERY Left    Family History:  Family History  Problem Relation Age of Onset   Diabetes Mother    Hyperlipidemia Mother    Hypertension Mother    Diabetes Maternal Aunt    Cancer Maternal Aunt    Hyperlipidemia Maternal Aunt    Hypertension Maternal Aunt    Diabetes Maternal Uncle     Cancer Maternal Uncle    Hyperlipidemia Maternal Uncle    Hypertension Maternal Uncle    Family Psychiatric  History: See previous Social History:  Social History   Substance and Sexual Activity  Alcohol Use No   Alcohol/week: 0.0 standard drinks   Comment: Sober for 12 yrs.     Social History   Substance and Sexual Activity  Drug Use No    Social History   Socioeconomic History   Marital status: Single    Spouse name: Not on file   Number of children: Not on file   Years of education: Not on file   Highest education level: Not on file  Occupational History   Not on file  Tobacco Use   Smoking status: Every Day    Types: Cigarettes   Smokeless tobacco: Never  Substance and Sexual Activity   Alcohol use: No    Alcohol/week: 0.0 standard drinks    Comment: Sober for 12 yrs.   Drug use: No   Sexual activity: Never  Other Topics Concern   Not on file  Social History Narrative   Not on file   Social Determinants of Health   Financial Resource Strain: Not on file  Food Insecurity: Not on file  Transportation Needs: Not on file  Physical Activity: Not on file  Stress: Not on file  Social  Connections: Not on file   Additional Social History:    Allergies:   Allergies  Allergen Reactions   Acetaminophen Other (See Comments)    Pt states that it makes him hyper. Pt states he takes tylenol when needed.     Labs:  Results for orders placed or performed during the hospital encounter of 11/25/20 (from the past 48 hour(s))  Glucose, capillary     Status: Abnormal   Collection Time: 11/27/20  3:22 PM  Result Value Ref Range   Glucose-Capillary 119 (H) 70 - 99 mg/dL    Comment: Glucose reference range applies only to samples taken after fasting for at least 8 hours.  CBC     Status: Abnormal   Collection Time: 11/28/20  5:15 AM  Result Value Ref Range   WBC 9.3 4.0 - 10.5 K/uL   RBC 4.18 (L) 4.22 - 5.81 MIL/uL   Hemoglobin 12.8 (L) 13.0 - 17.0 g/dL   HCT 63.8  (L) 75.6 - 52.0 %   MCV 90.4 80.0 - 100.0 fL   MCH 30.6 26.0 - 34.0 pg   MCHC 33.9 30.0 - 36.0 g/dL   RDW 43.3 29.5 - 18.8 %   Platelets 179 150 - 400 K/uL   nRBC 0.0 0.0 - 0.2 %    Comment: Performed at [facility], [address], [place], [place] [address]  Basic metabolic panel     Status: Abnormal   Collection Time: 11/28/20  5:15 AM  Result Value Ref Range   Sodium 138 135 - 145 mmol/L   Potassium 3.7 3.5 - 5.1 mmol/L   Chloride 105 98 - 111 mmol/L   CO2 22 22 - 32 mmol/L   Glucose, Bld 82 70 - 99 mg/dL    Comment: Glucose reference range applies only to samples taken after fasting for at least 8 hours.   BUN <5 (L) 6 - 20 mg/dL   Creatinine, Ser 6.30 0.61 - 1.24 mg/dL   Calcium 8.8 (L) 8.9 - 10.3 mg/dL   GFR, Estimated >16 >01 mL/min    Comment: (NOTE) Calculated using the CKD-EPI Creatinine Equation (2021)    Anion gap 11 5 - 15    Comment: Performed at [facility], [address] [place], [place] [address]  CK     Status: Abnormal   Collection Time: 11/28/20  5:15 AM  Result Value Ref Range   Total CK 2,801 (H) 49 - 397 U/L    Comment: Performed at [facility], [address] [place], [place] [address]  Magnesium     Status: None   Collection Time: 11/28/20  5:15 AM  Result Value Ref Range   Magnesium 1.8 1.7 - 2.4 mg/dL    Comment: Performed at [facility], [address] [place], [place] [address]  Phosphorus     Status: None   Collection Time: 11/28/20  5:15 AM  Result Value Ref Range   Phosphorus 4.1 2.5 - 4.6 mg/dL    Comment: Performed at [facility], [address] [place], [place] [address]  CBC     Status: Abnormal   Collection Time: 11/29/20  5:02 AM  Result Value Ref Range   WBC 6.8 4.0 - 10.5 K/uL   RBC 4.32 4.22 - 5.81 MIL/uL   Hemoglobin 13.0 13.0 - 17.0 g/dL   HCT 37.6 (L) 28.3 - 15.1 %   MCV 88.2 80.0 - 100.0 fL   MCH 30.1 26.0 - 34.0 pg   MCHC  34.1 30.0 - 36.0 g/dL   RDW 24.0 97.3 -  53.2 %   Platelets 195 150 - 400 K/uL   nRBC 0.0 0.0 - 0.2 %    Comment: Performed at [facility], [address] [place], [place] [address]  Basic metabolic panel     Status: Abnormal   Collection Time: 11/29/20  5:02 AM  Result Value Ref Range   Sodium 139 135 - 145 mmol/L   Potassium 3.9 3.5 - 5.1 mmol/L   Chloride 106 98 - 111 mmol/L   CO2 26 22 - 32 mmol/L   Glucose, Bld 100 (H) 70 - 99 mg/dL    Comment: Glucose reference range applies only to samples taken after fasting for at least 8 hours.   BUN 9 6 - 20 mg/dL   Creatinine, Ser 6.83 0.61 - 1.24 mg/dL   Calcium 9.3 8.9 - 41.9 mg/dL   GFR, Estimated >62 >22 mL/min    Comment: (NOTE) Calculated using the CKD-EPI Creatinine Equation (2021)    Anion gap 7 5 - 15    Comment: Performed at [facility], [address] [place], [place] [address]  CK     Status: Abnormal   Collection Time: 11/29/20  5:02 AM  Result Value Ref Range   Total CK 3,845 (H) 49 - 397 U/L    Comment: RESULT CONFIRMED BY MANUAL DILUTION SCS Performed at [facility], [address] [place], [place] [address]   Magnesium     Status: None   Collection Time: 11/29/20  5:02 AM  Result Value Ref Range   Magnesium 2.0 1.7 - 2.4 mg/dL    Comment: Performed at [facility], [address], [place], [place] [address]    Current Facility-Administered Medications  Medication Dose Route Frequency Provider Last Rate Last Admin   aspirin EC tablet 325 mg  325 mg Oral Daily Lurene Shadow, MD   325 mg at 11/29/20 0910   budesonide (PULMICORT) nebulizer solution 0.25 mg  0.25 mg Nebulization BID Jimmye Norman, NP   0.25 mg at 11/29/20 1448   Chlorhexidine Gluconate Cloth 2 % PADS 6 each  6 each Topical Daily Erin Fulling, MD   6 each at 11/29/20 0913   diazepam (VALIUM) injection 5 mg  5 mg Intravenous Q6H PRN Harlon Ditty D, NP   5 mg at 11/29/20 0419   diazepam (VALIUM) tablet 5 mg  5 mg Oral BID Lurene Shadow, MD   5 mg at 11/29/20 0910   docusate (COLACE) 50 MG/5ML liquid 100 mg  100 mg Oral BID PRN Rust-[name], Micheline Rough L, NP       docusate sodium (COLACE) capsule 200 mg  200 mg Oral BID Salena Saner, MD   200 mg at 11/29/20 0910   enoxaparin (LOVENOX) injection 40 mg  40 mg Subcutaneous Q24H Salena Saner, MD   40 mg at 11/28/20 1948   feeding supplement (ENSURE ENLIVE / ENSURE PLUS) liquid 237 mL  237 mL Oral TID BM Kasa, Wallis Bamberg, MD   237 mL at 11/29/20 1446   ipratropium-albuterol (DUONEB) 0.5-2.5 (3) MG/3ML nebulizer solution 3 mL  3 mL Nebulization Q6H PRN Jimmye Norman, NP       lactated ringers infusion   Intravenous Continuous Lurene Shadow, MD 100 mL/hr at 11/29/20 0805 Rate Change at 11/29/20 0805   meloxicam (MOBIC) tablet 7.5 mg  7.5 mg Oral Daily Lurene Shadow, MD   7.5 mg at 11/29/20 1445  multivitamin with minerals tablet 1 tablet  1 tablet Oral Daily Erin Fulling, MD   1 tablet at 11/29/20 0910   pantoprazole (PROTONIX) EC tablet 40 mg  40 mg Oral QHS Salena Saner, MD   40 mg at 11/28/20 2003   polyethylene glycol (MIRALAX / GLYCOLAX) packet 17 g  17 g Oral Daily PRN Rust-[name], Cecelia Byars, NP       potassium chloride (KLOR-CON) packet 40 mEq  40 mEq Oral BID Rust-[name], Britton L, NP   40 mEq at 11/29/20 0907   [START ON 12/02/2020] pregabalin (LYRICA) capsule 100 mg  100 mg Oral TID Rejeana Brock, MD       pregabalin (LYRICA) capsule 50 mg  50 mg Oral TID Rejeana Brock, MD   50 mg at 11/29/20 0910   QUEtiapine (SEROQUEL) tablet 200 mg  200 mg Oral TID , Jackquline Denmark, MD        Musculoskeletal: Strength & Muscle Tone: within normal limits Gait & Station: normal Patient leans: N/A            Psychiatric Specialty Exam:  Presentation  General Appearance:  No data recorded Eye Contact: No data recorded Speech: No data recorded Speech Volume: No data recorded Handedness: No data recorded  Mood and  Affect  Mood: No data recorded Affect: No data recorded  Thought Process  Thought Processes: No data recorded Descriptions of Associations:No data recorded Orientation:No data recorded Thought Content:No data recorded History of Schizophrenia/Schizoaffective disorder:No data recorded Duration of Psychotic Symptoms:No data recorded Hallucinations:No data recorded Ideas of Reference:No data recorded Suicidal Thoughts:No data recorded Homicidal Thoughts:No data recorded  Sensorium  Memory: No data recorded Judgment: No data recorded Insight: No data recorded  Executive Functions  Concentration: No data recorded Attention Span: No data recorded Recall: No data recorded Fund of Knowledge: No data recorded Language: No data recorded  Psychomotor Activity  Psychomotor Activity: No data recorded  Assets  Assets: No data recorded  Sleep  Sleep: No data recorded  Physical Exam: Physical Exam Vitals and nursing note reviewed.  Constitutional:      Appearance: Normal appearance.  HENT:     Head: Normocephalic and atraumatic.     Mouth/Throat:     Pharynx: Oropharynx is clear.  Eyes:     Pupils: Pupils are equal, round, and reactive to light.  Cardiovascular:     Rate and Rhythm: Normal rate and regular rhythm.  Pulmonary:     Effort: Pulmonary effort is normal.     Breath sounds: Normal breath sounds.  Abdominal:     General: Abdomen is flat.     Palpations: Abdomen is soft.  Musculoskeletal:        General: Normal range of motion.  Skin:    General: Skin is warm and dry.  Neurological:     General: No focal deficit present.     Mental Status: He is alert. Mental status is at baseline.  Psychiatric:        Attention and Perception: Attention normal.        Mood and Affect: Mood is anxious.        Speech: Speech is rapid and pressured.        Behavior: Behavior is cooperative.        Thought Content: Thought content normal.   Review of Systems   Constitutional: Negative.   HENT: Negative.    Eyes: Negative.   Respiratory: Negative.    Cardiovascular: Negative.   Gastrointestinal: Negative.  Musculoskeletal:  Positive for back pain and joint pain.  Skin: Negative.   Neurological: Negative.   Psychiatric/Behavioral:  Negative for depression, hallucinations and suicidal ideas. The patient has insomnia.   Blood pressure (!) 162/89, pulse 93, temperature 98.6 F (37 C), temperature source Oral, resp. rate 18, height 6\' 1"  (1.854 m), weight 100.1 kg, SpO2 97 %. Body mass index is 29.12 kg/m.  Treatment Plan Summary: Medication management and Plan increased dose of Seroquel to 200 mg 3 times a day.  No change to other medicine right now.  Patient agrees to this change of medicine.  Seems to be getting stabilized psychiatrically may be ready for discharge at least from a psychiatric standpoint within the next day or so.  Disposition: No evidence of imminent risk to self or others at present.   Patient does not meet criteria for psychiatric inpatient admission. Supportive therapy provided about ongoing stressors. Discussed crisis plan, support from social network, calling 911, coming to the Emergency Department, and calling Suicide Hotline.  Mordecai Rasmussen, MD 11/29/2020 3:13 PM

## 2020-11-29 NOTE — Progress Notes (Signed)
Reviewed MRI C-spine. There is some canal stenosis resulting in impingement of the right side of his cord. Given that he reports his right side is worse than typical, I would discuss this finding with neurosurgery. I would continue lyrica given that he was on it previously and even though this is a first unprovoked seizure, give his comorbidities, I think that continued treatment with lyrica is likely prudent as it can help both with chronic pain and seizure prevention, at 50mg  TID for a few more days, then 100mg  TID, order placed.   Neurology will be available as needed.   , MD [facility] [phone]  If 7pm- 7am, please page neurology on call as listed in AMION.

## 2020-11-29 NOTE — Consult Note (Signed)
PHARMACY CONSULT NOTE  Pharmacy Consult for Electrolyte Monitoring and Replacement   Recent Labs: Potassium (mmol/L)  Date Value  11/29/2020 3.9  06/08/2014 3.7   Magnesium (mg/dL)  Date Value  24/58/0998 2.0   Calcium (mg/dL)  Date Value  33/82/5053 9.3   Calcium, Total (mg/dL)  Date Value  97/67/3419 9.3   Albumin (g/dL)  Date Value  37/90/2409 4.5  06/05/2014 4.5   Phosphorus (mg/dL)  Date Value  73/53/2992 4.1   Sodium (mmol/L)  Date Value  11/29/2020 139  06/05/2014 135   Assessment: Patient is a 50 y/o M with medical history including bipolar disorder, polysubstance abuse, chronic pain associated with psychosocial dysfunction, lumbar canal stenosis, HLD, schizophrenia, CVA, HTN, COPD is admitted with seizure like activity. Patient was ultimately intubated and is now extubated but remains in the ICU. Pharmacy consulted to assist with electrolyte monitoring and replacement as indicated.  MIVF: LR at 100 mL/hr (rhabdomyolysis)  Goal of Therapy:  Electrolytes within normal limits  Plan:  Continue Kcl 40 mEq BID as ordered. Defer management to hospitalist Patient care transitioned from PCCM to [facility]. Will discontinue consult at this time Pharmacy will continue to monitor peripherally  Tressie Ellis 11/29/2020 10:41 AM

## 2020-11-29 NOTE — Progress Notes (Signed)
Patient awake most of the night. Impulsive at times, frequent repetitive requests. Requires constant redirection. Labile and anxious at times, hyperverbal, however redirectable. Valium IV  given for anxiety x1-minimal effects noted. Advil prn given x2 for c/o lower back pain-partial effects noted.

## 2020-11-29 NOTE — Progress Notes (Signed)
Physical Therapy Treatment Patient Details Name: Scott Howell MRN: [id number] DOB: Feb 03, 1971 Today's Date: 11/29/2020   History of Present Illness Scott Howell is a 50 y.o. male with past medical history of COPD, bipolar disorder, schizophrenia, HTN, HDL, CVA and GERD who presents via EMS on 10/12 for reportedly some shoulder pain. Enroute had a seizure and was violent post ictal. He was in acute distress on arrival to the ED and was subsequently intubated. Pt extubated 10/14.    PT Comments    Pt received directly from NT standing at sink for personal care and agreeable for further ambulation. Pt ambulated 500 ft with personal SPC and supervision. Pt demonstrating improved static and dynamic balance during ambulation trial. Pt appears to be near or at his baseline and will not require further PT follow up at discharge. Will continue to work with patient during admission to maintain strength and balance to be able to discharge safely and reduce readmission risk.     Recommendations for follow up therapy are one component of a multi-disciplinary discharge planning process, led by the attending physician.  Recommendations may be updated based on patient status, additional functional criteria and insurance authorization.  Follow Up Recommendations  No PT follow up     Equipment Recommendations  None recommended by PT    Recommendations for Other Services       Precautions / Restrictions Precautions Precautions: None Restrictions Weight Bearing Restrictions: No     Mobility  Bed Mobility               General bed mobility comments: Pt received in standing from NT    Transfers                 General transfer comment: Pt received in standing from NT  Ambulation/Gait Ambulation/Gait assistance: Supervision Gait Distance (Feet): 500 Feet Assistive device: Straight cane Gait Pattern/deviations: WFL(Within Functional Limits);Step-through pattern Gait  velocity: WFL   General Gait Details: Pt ambulating with SPC and L knee brace due to chronic pain. Pt ambulating with no apparent balance deficits at this time. Pt demonstrating good understanding of usage of assistive device   Stairs             Wheelchair Mobility    Modified Rankin (Stroke Patients Only)       Balance Overall balance assessment: Modified Independent         Standing balance support: Single extremity supported;During functional activity Standing balance-Leahy Scale: Good Standing balance comment: Static and dynamic balance good with usage of SPC for steadying. Pt placing light weight through [facility] during ambulation trial. Able to take a few steps without SPC and no balance deficits noted      Cognition Arousal/Alertness: Awake/alert Behavior During Therapy: WFL for tasks assessed/performed Overall Cognitive Status: Within Functional Limits for tasks assessed           General Comments: Pt will improved safety awareness and decreased impulsiveness during treatment      Exercises      General Comments        Pertinent Vitals/Pain Pain Assessment: No/denies pain    Home Living                      Prior Function            PT Goals (current goals can now be found in the care plan section) Acute Rehab PT Goals Patient Stated Goal: to go home PT Goal Formulation: With  patient Time For Goal Achievement: 12/11/20 Potential to Achieve Goals: Good Progress towards PT goals: Progressing toward goals    Frequency    Min 2X/week      PT Plan Discharge plan needs to be updated    Co-evaluation              AM-PAC PT "6 Clicks" Mobility   Outcome Measure  Help needed turning from your back to your side while in a flat bed without using bedrails?: None Help needed moving from lying on your back to sitting on the side of a flat bed without using bedrails?: None Help needed moving to and from a bed to a chair (including  a wheelchair)?: None Help needed standing up from a chair using your arms (e.g., wheelchair or bedside chair)?: None Help needed to walk in hospital room?: None Help needed climbing 3-5 steps with a railing? : A Little 6 Click Score: 23    End of Session Equipment Utilized During Treatment: Gait belt Activity Tolerance: Patient tolerated treatment well Patient left: in bed (Seated EOB for personal care) Nurse Communication: Mobility status;Other (comment) (Pt position at end of treatment) PT Visit Diagnosis: Muscle weakness (generalized) (M62.81)     Time: 9702-6378 PT Time Calculation (min) (ACUTE ONLY): 10 min  Charges:  $Gait Training: 8-22 mins                     Verl Blalock, SPT    Verl Blalock 11/29/2020, 9:05 AM

## 2020-11-29 NOTE — Consult Note (Signed)
Chief Complaint:  Cervical Stenosis   History of Present Illness: 11/29/2020 Scott Howell is a 50 y.o. male who presents with the chief complaint of right upper extremity weakness and neck pain. He states that he has had 5 previous cervical operations and fusions. He has had chronic worsening neck pain over the past few years and has had some falls. States that he had more falls recently and approximately 7-10 days ago had a fall and noticed weakness of his RUE, specifically the hand. A few days after that he was having some troubles with heating his house, and then suffered a seizure after which he contacted EMS. He was intubated, and upon extubation and awakening. He was able to participate more fully with exam where it was noticed that he had RUE weakness. MRI was performed demonstrating some adjacent level disease. Denies Bowel or bladder dysfunction.   Review of Systems:  A 10 point review of systems is negative, except for the pertinent positives and negatives detailed in the HPI.  Past Medical History: Past Medical History:  Diagnosis Date   Bipolar 1 disorder (HCC)    COPD (chronic obstructive pulmonary disease) (HCC)    GERD (gastroesophageal reflux disease)    High cholesterol    Hypertension    Schizophrenia (HCC)    Stroke [facility])     Past Surgical History: Past Surgical History:  Procedure Laterality Date   CERVICAL SPINE SURGERY     4   HARDWARE REMOVAL Left 04/20/2015   Procedure: HARDWARE REMOVAL left leg;  Surgeon: Deeann Saint, MD;  Location: [facility] ORS;  Service: Orthopedics;  Laterality: Left;   KNEE SURGERY Left    SHOULDER SURGERY Left     Allergies: Allergies as of 11/25/2020 - Review Complete 11/25/2020  Allergen Reaction Noted   Acetaminophen Other (See Comments) 07/06/2014    Medications:  Current Facility-Administered Medications:    aspirin EC tablet 325 mg, 325 mg, Oral, Daily, Lurene Shadow, MD, 325 mg at 11/29/20 0910   budesonide (PULMICORT)  nebulizer solution 0.25 mg, 0.25 mg, Nebulization, BID, Ouma, Hubbard Hartshorn, NP, 0.25 mg at 11/29/20 2353   Chlorhexidine Gluconate Cloth 2 % PADS 6 each, 6 each, Topical, Daily, Kasa, Kurian, MD, 6 each at 11/29/20 0913   diazepam (VALIUM) injection 5 mg, 5 mg, Intravenous, Q6H PRN, Harlon Ditty D, NP, 5 mg at 11/29/20 0419   diazepam (VALIUM) tablet 5 mg, 5 mg, Oral, BID, Lurene Shadow, MD, 5 mg at 11/29/20 2156   docusate (COLACE) 50 MG/5ML liquid 100 mg, 100 mg, Oral, BID PRN, Rust-[name], Micheline Rough L, NP   docusate sodium (COLACE) capsule 200 mg, 200 mg, Oral, BID, Sarina Ser L, MD, 200 mg at 11/29/20 2155   enoxaparin (LOVENOX) injection 40 mg, 40 mg, Subcutaneous, Q24H, Salena Saner, MD, 40 mg at 11/29/20 2011   feeding supplement (ENSURE ENLIVE / ENSURE PLUS) liquid 237 mL, 237 mL, Oral, TID BM, Kasa, Kurian, MD, 237 mL at 11/29/20 2011   ipratropium-albuterol (DUONEB) 0.5-2.5 (3) MG/3ML nebulizer solution 3 mL, 3 mL, Nebulization, Q6H PRN, Ouma, Hubbard Hartshorn, NP   lactated ringers infusion, , Intravenous, Continuous, Lurene Shadow, MD, Last Rate: 100 mL/hr at 11/29/20 1937, Infusion Verify at 11/29/20 1937   meloxicam (MOBIC) tablet 7.5 mg, 7.5 mg, Oral, Daily, Lurene Shadow, MD, 7.5 mg at 11/29/20 1445   multivitamin with minerals tablet 1 tablet, 1 tablet, Oral, Daily, Erin Fulling, MD, 1 tablet at 11/29/20 0910   nicotine (NICODERM CQ - dosed in mg/24  hours) patch 21 mg, 21 mg, Transdermal, Daily, Lurene Shadow, MD, 21 mg at 11/29/20 1740   pantoprazole (PROTONIX) EC tablet 40 mg, 40 mg, Oral, QHS, Salena Saner, MD, 40 mg at 11/29/20 2155   polyethylene glycol (MIRALAX / GLYCOLAX) packet 17 g, 17 g, Oral, Daily PRN, Rust-[name], Micheline Rough L, NP   potassium chloride (KLOR-CON) packet 40 mEq, 40 mEq, Oral, BID, Rust-[name], Britton L, NP, 40 mEq at 11/29/20 2155   [START ON 12/02/2020] pregabalin (LYRICA) capsule 100 mg, 100 mg, Oral, TID, Rejeana Brock, MD   pregabalin (LYRICA) capsule 50 mg, 50 mg, Oral, TID, Rejeana Brock, MD, 50 mg at 11/29/20 2155   QUEtiapine (SEROQUEL) tablet 200 mg, 200 mg, Oral, TID, Clapacs, Jackquline Denmark, MD, 200 mg at 11/29/20 2155   Social History: Social History   Tobacco Use   Smoking status: Every Day    Types: Cigarettes   Smokeless tobacco: Never  Substance Use Topics   Alcohol use: No    Alcohol/week: 0.0 standard drinks    Comment: Sober for 12 yrs.   Drug use: No    Family Medical History: Family History  Problem Relation Age of Onset   Diabetes Mother    Hyperlipidemia Mother    Hypertension Mother    Diabetes Maternal Aunt    Cancer Maternal Aunt    Hyperlipidemia Maternal Aunt    Hypertension Maternal Aunt    Diabetes Maternal Uncle    Cancer Maternal Uncle    Hyperlipidemia Maternal Uncle    Hypertension Maternal Uncle     Physical Examination: Vitals:   11/29/20 1738 11/29/20 2054  BP: (!) 168/90 (!) 154/89  Pulse: 90 94  Resp: 18   Temp: 99.2 F (37.3 C) 98.1 F (36.7 C)  SpO2: 98% 100%     General: Patient is well developed, well nourished, calm, collected, and in no apparent distress.  Psychiatric: Patient is non-anxious.  Head:  Pupils equal, round, and reactive to light.  ENT:  Oral mucosa appears well hydrated.  Neck:   Supple.  Limited ROM, with posterior incision, midline paraspinal muscle wasting.   Respiratory: Patient is breathing without any difficulty.  Extremities: No edema. FDI wasting on right  Vascular: Palpable pulses in dorsal pedal vessels.  Skin:   On exposed skin, there are no abnormal skin lesions.  NEUROLOGICAL:  General: In no acute distress.   Awake, alert, oriented to person, place, and time.  Pupils equal round and reactive to light.  Facial tone is symmetric.  Tongue protrusion is midline.  There is no pronator drift.  ROM of spine: Limited  Palpation of spine: Midline Tenderness.    Strength: Side Biceps  Triceps Deltoid Interossei Grip Wrist Ext. Wrist Flex.  R 4 5 5 2 2 2 5   L 5 5 5 5 5 5 5    Side Iliopsoas Quads Hamstring PF DF EHL  R 5 5 5 5 5 5   L 2 2 5  4+ 4+ 5    Left C8 dermatomal dysthesias, mild left side dysesthesia.  Reflexes are  3+ on the right, 2+ on the left. 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 No intravenous contrast was administered.   COMPARISON:  12/24/2016   FINDINGS: Alignment: Stable including retrolisthesis at  C3-C4 and C4-C5.   Vertebrae: Stable vertebral body heights. Postoperative changes of anterior and posterior fusion again identified at C5-C7. The hardware is not well evaluated and there is associated susceptibility artifact. There does appear to be osseous fusion across the disc spaces. There is no substantial marrow edema. No suspicious osseous lesion.   Cord: Unchanged abnormal T2 hyperintensity and volume loss at the right aspect of the cord at the C5-C6 levels consistent with myelomalacia. Otherwise unremarkable.   Posterior Fossa, vertebral arteries, paraspinal tissues: No new abnormality.   Disc levels: Imaged in the sagittal plane only, there is a stable disc bulge at T1-T2.   C2-C3: Disc bulge with endplate osteophytes. Uncovertebral hypertrophy. No significant canal or right foraminal stenosis. Minor left foraminal stenosis. Appearance is similar.   C3-C4: Disc bulge with endplate osteophytes slightly eccentric to the right. Uncovertebral hypertrophy. Increased moderate to marked canal stenosis. Similar marked foraminal stenosis, right greater than left.   C4-C5: Disc bulge with endplate osteophytes. Uncovertebral hypertrophy. Moderate canal stenosis. Marked foraminal  stenosis, right greater than left. Appearance is similar.   C5-C6: Fusion level. Bridging bone partially effaces the right ventral subarachnoid space. No canal or foraminal stenosis.   C6-C7:  Fusion level.  No canal or foraminal stenosis.   C7-T1: Disc bulge with endplate osteophytes. Mild to moderate canal stenosis. Moderate to marked foraminal stenosis. Appearance is similar.   IMPRESSION: Multilevel degenerative and postoperative changes as detailed above. Increased moderate to marked canal stenosis at C3-C4. Otherwise no substantial change. Foraminal narrowing remains greatest at C3-C4, C4-C5, and C7-T1.   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     Electronically Signed   By: Guadlupe Spanish M.D.   On: 11/28/2020 15:29    I have personally reviewed the images and agree with the above interpretation.  Labs: CBC Latest Ref Rng & Units 11/29/2020 11/28/2020 11/27/2020  WBC 4.0 - 10.5 K/uL 6.8 9.3 8.0  Hemoglobin 13.0 - 17.0 g/dL 52.7 12.8(L) 13.7  Hematocrit 39.0 - 52.0 % 38.1(L) 37.8(L) 39.5  Platelets 150 - 400 K/uL 195 179 181     Assessment and Plan: Mr. Sherrin is a pleasant 50 y.o. male with a history of 5 cervical surgeries from both the front and back. Has had many years of worsening. States that has had some recent falls and since one about 7-10 days ago where he had a fall where he developed hand pain and weakness right worse than left. States that his right hand has been unable to open an close since that time. He has chronic LLE weakness since his index accident many years ago. At this point he seems to have suffered a spinal cord contusion impacting his right hand strength to the greatest extent. He is on aspirin and lovenox at this time, and does have a history of CVA and is a current active smoker. Given that his deficit began 7 days ago and hs been stable, and that he has medical conditions causing him to require ASA 325 and prophylactic lovenox. It would be ideal to  optimize him medically prior to any cervical revision surgery. This will likely be discussed with him as an outpatient in the future. At this point would consider hard collar while up and ambulating.    Lovenia Kim, MD Dept. of Neurosurgery

## 2020-11-29 NOTE — Progress Notes (Signed)
Progress Note    Scott Howell  UJW:[id number] DOB: [birth year]  DOA: 11/25/2020 PCP: Kerman Passey, MD      Brief Narrative:    Medical records reviewed and are as summarized below:  Scott Howell is a 50 y.o. male with medical history significant for bipolar disorder, schizophrenia, polysubstance abuse, lumbar canal stenosis, chronic pain, hyperlipidemia, hypertension, stroke, COPD.  He was brought to the hospital because of witnessed seizure-like activity.  Reportedly, patient had another seizure-like activity in the emergency room lasting about 30 seconds.  He became bradycardic with heart rate in the 30s.  He also became postictal and was apparently violent and agitated.  He was admitted to the ICU for seizures.  He was intubated for acute hypoxemic respiratory failure.  He was treated with IV Keppra and diazepam.  He was evaluated by the neurologist and psychiatrist.  He was successfully extubated on 11/27/2020 and transferred to the hospitalist service on 11/28/2020.      Assessment/Plan:   Principal Problem:   Bipolar 1 disorder, mixed (HCC) Active Problems:   Seizure (HCC)   Nutrition Problem: Inadequate oral intake Etiology: inability to eat (pt sedated and ventilated)  Signs/Symptoms: NPO status   Body mass index is 29.12 kg/m.   Seizure, acute metabolic encephalopathy: Has been started on Lyrica to help with pain and also was on antiepileptic.  Taper off diazepam.  Cervical spinal stenosis with severe foraminal narrowing at C3-C4: He has been started on meloxicam for pain.  Dr. Ernestine Mcmurray, neurosurgeon, has been consulted.  He recommended a cervical collar.  Rhabdomyolysis: CK level is still trending (up to 3,845).  Continue IV fluids but decrease rate.   Bipolar disorder, schizophrenia: Continue psychotropics.  Follow-up with psychiatrist.  Hypertension: BP is elevated.  He was taking amlodipine at home.  Resume amlodipine.  Acute  hypoxemic respiratory failure: S/p extubation on 11/27/2020.  He is tolerating room air.   Diet Order             DIET DYS 3 Room service appropriate? Yes; Fluid consistency: Thin  Diet effective now                      Consultants: Neurologist Psychiatrist Intensivist  Procedures: Intubation and mechanical ventilation on 11/26/2020    Medications:    aspirin EC  325 mg Oral Daily   budesonide (PULMICORT) nebulizer solution  0.25 mg Nebulization BID   Chlorhexidine Gluconate Cloth  6 each Topical Daily   diazepam  5 mg Oral BID   docusate sodium  200 mg Oral BID   enoxaparin (LOVENOX) injection  40 mg Subcutaneous Q24H   feeding supplement  237 mL Oral TID BM   meloxicam  7.5 mg Oral Daily   multivitamin with minerals  1 tablet Oral Daily   pantoprazole  40 mg Oral QHS   potassium chloride  40 mEq Oral BID   [START ON 12/02/2020] pregabalin  100 mg Oral TID   pregabalin  50 mg Oral TID   QUEtiapine  200 mg Oral TID   Continuous Infusions:  lactated ringers 100 mL/hr at 11/29/20 0805     Anti-infectives (From admission, onward)    None              Family Communication/Anticipated D/C date and plan/Code Status   DVT prophylaxis: enoxaparin (LOVENOX) injection 40 mg Start: 11/27/20 2000 SCDs Start: 11/26/20 0028     Code Status: Full Code  Family  Communication: None Disposition Plan:    Status is: Inpatient  Remains inpatient appropriate because: Cognitive impairment, seizure           Subjective:   Interval events noted.  He complains of neck and back pain.  He also complains of pain in the left knee and thinks he needs a knee replacement.  He requested an anti-inflammatory for pain.  Objective:    Vitals:   11/29/20 0418 11/29/20 0425 11/29/20 0738 11/29/20 1208  BP: 116/66  (!) 155/100 (!) 162/89  Pulse: 86  70 93  Resp: 17  18 18   Temp: 98.4 F (36.9 C)  97.9 F (36.6 C) 98.6 F (37 C)  TempSrc: Oral  Oral  Oral  SpO2: 97%  99% 97%  Weight:  100.1 kg    Height:       No data found.   Intake/Output Summary (Last 24 hours) at 11/29/2020 1524 Last data filed at 11/29/2020 1300 Gross per 24 hour  Intake 480 ml  Output 2000 ml  Net -1520 ml   Filed Weights   11/27/20 0106 11/28/20 0309 11/29/20 0425  Weight: 99.8 kg 91.1 kg 100.1 kg    Exam:  GEN: NAD SKIN: No rash EYES: EOMI ENT: MMM CV: RRR PULM: CTA B ABD: soft, ND, NT, +BS CNS: AAO x 3, non focal EXT: No edema or tenderness.  He has a  brace on the left knee PSYCH: He is anxious but cooperative      Data Reviewed:   I have personally reviewed following labs and imaging studies:  Labs: Labs show the following:   Basic Metabolic Panel: Recent Labs  Lab 11/26/20 0157 11/26/20 2048 11/27/20 0554 11/28/20 0515 11/29/20 0502  NA 129* 139 139 138 139  K 3.4* 2.8* 3.7 3.7 3.9  CL 96* 108 108 105 106  CO2 25 25 25 22 26   GLUCOSE 103* 78 96 82 100*  BUN <5* <5* <5* <5* 9  CREATININE 0.75 0.82 0.85 0.68 0.71  CALCIUM 8.4* 8.4* 8.3* 8.8* 9.3  MG 1.9 2.1 2.1 1.8 2.0  PHOS 3.6  --  3.9 4.1  --    GFR Estimated Creatinine Clearance: 137.5 mL/min (by C-G formula based on SCr of 0.71 mg/dL). Liver Function Tests: Recent Labs  Lab 11/25/20 2259  AST 67*  ALT 29  ALKPHOS 49  BILITOT 0.9  PROT 7.7  ALBUMIN 4.5   No results for input(s): LIPASE, AMYLASE in the last 168 hours. No results for input(s): AMMONIA in the last 168 hours. Coagulation profile Recent Labs  Lab 11/25/20 2259 11/26/20 0157  INR 1.1 1.0    CBC: Recent Labs  Lab 11/25/20 2259 11/26/20 0157 11/26/20 2048 11/27/20 0554 11/28/20 0515 11/29/20 0502  WBC 11.0* 9.9 7.5 8.0 9.3 6.8  NEUTROABS 6.6  --   --   --   --   --   HGB 14.9 13.7 14.0 13.7 12.8* 13.0  HCT 42.1 38.0* 39.1 39.5 37.8* 38.1*  MCV 89.4 86.2 89.9 90.6 90.4 88.2  PLT 246 193 169 181 179 195   Cardiac Enzymes: Recent Labs  Lab 11/25/20 2259 11/26/20 2048  11/27/20 0554 11/28/20 0515 11/29/20 0502  CKTOTAL 2,004* 901* 604* 2,801* 3,845*   BNP (last 3 results) No results for input(s): PROBNP in the last 8760 hours. CBG: Recent Labs  Lab 11/26/20 2324 11/27/20 0332 11/27/20 0714 11/27/20 1126 11/27/20 1522  GLUCAP 83 100* 96 114* 119*   D-Dimer: No results for input(s): DDIMER in  the last 72 hours. Hgb A1c: No results for input(s): HGBA1C in the last 72 hours.  Lipid Profile: No results for input(s): CHOL, HDL, LDLCALC, TRIG, CHOLHDL, LDLDIRECT in the last 72 hours.  Thyroid function studies: No results for input(s): TSH, T4TOTAL, T3FREE, THYROIDAB in the last 72 hours.  Invalid input(s): FREET3  Anemia work up: No results for input(s): VITAMINB12, FOLATE, FERRITIN, TIBC, IRON, RETICCTPCT in the last 72 hours. Sepsis Labs: Recent Labs  Lab 11/25/20 2259 11/26/20 0013 11/26/20 0157 11/26/20 2048 11/27/20 0554 11/28/20 0515 11/29/20 0502  PROCALCITON <0.10  --   --   --   --   --   --   WBC 11.0*  --  9.9 7.5 8.0 9.3 6.8  LATICACIDVEN  --  2.3* 1.2 1.4  --   --   --     Microbiology Recent Results (from the past 240 hour(s))  Resp Panel by RT-PCR (Flu A&B, Covid)     Status: None   Collection Time: 11/25/20 10:59 PM   Specimen: Nasopharyngeal(NP) swabs in vial transport medium  Result Value Ref Range Status   SARS Coronavirus 2 by RT PCR NEGATIVE NEGATIVE Final    Comment: (NOTE) SARS-CoV-2 target nucleic acids are NOT DETECTED.  The SARS-CoV-2 RNA is generally detectable in upper respiratory specimens during the acute phase of infection. The lowest concentration of SARS-CoV-2 viral copies this assay can detect is 138 copies/mL. A negative result does not preclude SARS-Cov-2 infection and should not be used as the sole basis for treatment or other patient management decisions. A negative result may occur with  improper specimen collection/handling, submission of specimen other than nasopharyngeal swab,  presence of viral mutation(s) within the areas targeted by this assay, and inadequate number of viral copies(<138 copies/mL). A negative result must be combined with clinical observations, patient history, and epidemiological information. The expected result is Negative.  Fact Sheet for Patients:  BloggerCourse.com  Fact Sheet for Healthcare Providers:  SeriousBroker.it  This test is no t yet approved or cleared by the [place] FDA and  has been authorized for detection and/or diagnosis of SARS-CoV-2 by FDA under an Emergency Use Authorization (EUA). This EUA will remain  in effect (meaning this test can be used) for the duration of the COVID-19 declaration under Section 564(b)(1) of the Act, 21 U.S.C.section 360bbb-3(b)(1), unless the authorization is terminated  or revoked sooner.       Influenza A by PCR NEGATIVE NEGATIVE Final   Influenza B by PCR NEGATIVE NEGATIVE Final    Comment: (NOTE) The Xpert Xpress SARS-CoV-2/FLU/RSV plus assay is intended as an aid in the diagnosis of influenza from Nasopharyngeal swab specimens and should not be used as a sole basis for treatment. Nasal washings and aspirates are unacceptable for Xpert Xpress SARS-CoV-2/FLU/RSV testing.  Fact Sheet for Patients: BloggerCourse.com  Fact Sheet for Healthcare Providers: SeriousBroker.it  This test is not yet approved or cleared by the [place] FDA and has been authorized for detection and/or diagnosis of SARS-CoV-2 by FDA under an Emergency Use Authorization (EUA). This EUA will remain in effect (meaning this test can be used) for the duration of the COVID-19 declaration under Section 564(b)(1) of the Act, 21 U.S.C. section 360bbb-3(b)(1), unless the authorization is terminated or revoked.  Performed at [facility], [address] [place], [place] [address]   Culture, blood  (single) w Reflex to ID Panel     Status: None (Preliminary result)   Collection Time: 11/26/20  1:57 AM  Specimen: BLOOD  Result Value Ref Range Status   Specimen Description BLOOD LEFT HAND  Final   Special Requests   Final    BOTTLES DRAWN AEROBIC AND ANAEROBIC Blood Culture results may not be optimal due to an excessive volume of blood received in culture bottles   Culture   Final    NO GROWTH 3 DAYS Performed at [facility], [address] [place], [place] [address]    Report Status PENDING  Incomplete  MRSA Next Gen by PCR, Nasal     Status: None   Collection Time: 11/26/20  9:10 PM   Specimen: Nasal Mucosa; Nasal Swab  Result Value Ref Range Status   MRSA by PCR Next Gen NOT DETECTED NOT DETECTED Final    Comment: (NOTE) The GeneXpert MRSA Assay (FDA approved for NASAL specimens only), is one component of a comprehensive MRSA colonization surveillance program. It is not intended to diagnose MRSA infection nor to guide or monitor treatment for MRSA infections. Test performance is not FDA approved in patients less than 61 years old. Performed at [facility], [address] [place], [place] [address]     Procedures and diagnostic studies:  MR CERVICAL SPINE WO CONTRAST  Result Date: 11/28/2020 CLINICAL DATA:  Myelopathy, acute or progressive EXAM: MRI CERVICAL SPINE WITHOUT CONTRAST TECHNIQUE: Multiplanar, multisequence MR imaging of the cervical spine was performed. No intravenous contrast was administered. COMPARISON:  12/24/2016 FINDINGS: Alignment: Stable including retrolisthesis at C3-C4 and C4-C5. Vertebrae: Stable vertebral body heights. Postoperative changes of anterior and posterior fusion again identified at C5-C7. The hardware is not well evaluated and there is associated susceptibility artifact. There does appear to be osseous fusion across the disc spaces. There is no substantial marrow edema. No suspicious osseous lesion. Cord: Unchanged  abnormal T2 hyperintensity and volume loss at the right aspect of the cord at the C5-C6 levels consistent with myelomalacia. Otherwise unremarkable. Posterior Fossa, vertebral arteries, paraspinal tissues: No new abnormality. Disc levels: Imaged in the sagittal plane only, there is a stable disc bulge at T1-T2. C2-C3: Disc bulge with endplate osteophytes. Uncovertebral hypertrophy. No significant canal or right foraminal stenosis. Minor left foraminal stenosis. Appearance is similar. C3-C4: Disc bulge with endplate osteophytes slightly eccentric to the right. Uncovertebral hypertrophy. Increased moderate to marked canal stenosis. Similar marked foraminal stenosis, right greater than left. C4-C5: Disc bulge with endplate osteophytes. Uncovertebral hypertrophy. Moderate canal stenosis. Marked foraminal stenosis, right greater than left. Appearance is similar. C5-C6: Fusion level. Bridging bone partially effaces the right ventral subarachnoid space. No canal or foraminal stenosis. C6-C7:  Fusion level.  No canal or foraminal stenosis. C7-T1: Disc bulge with endplate osteophytes. Mild to moderate canal stenosis. Moderate to marked foraminal stenosis. Appearance is similar. IMPRESSION: Multilevel degenerative and postoperative changes as detailed above. Increased moderate to marked canal stenosis at C3-C4. Otherwise no substantial change. Foraminal narrowing remains greatest at C3-C4, C4-C5, and C7-T1. 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 Electronically Signed   By: Guadlupe Spanish M.D.   On: 11/28/2020 15:29               LOS: 3 days      [facility]   Pager on [email or web address]. If 7PM-7AM, please contact night-coverage at [email or web address]     11/29/2020, 3:24 PM

## 2020-11-30 DIAGNOSIS — R569 Unspecified convulsions: Secondary | ICD-10-CM | POA: Diagnosis not present

## 2020-11-30 DIAGNOSIS — F3160 Bipolar disorder, current episode mixed, unspecified: Secondary | ICD-10-CM | POA: Diagnosis not present

## 2020-11-30 LAB — CK: Total CK: 3390 U/L — ABNORMAL HIGH (ref 49–397)

## 2020-11-30 LAB — BASIC METABOLIC PANEL
Anion gap: 9 (ref 5–15)
BUN: 10 mg/dL (ref 6–20)
CO2: 24 mmol/L (ref 22–32)
Calcium: 9.7 mg/dL (ref 8.9–10.3)
Chloride: 103 mmol/L (ref 98–111)
Creatinine, Ser: 0.69 mg/dL (ref 0.61–1.24)
GFR, Estimated: >60 mL/min (ref >60)
Glucose, Bld: 100 mg/dL — ABNORMAL HIGH (ref 70–99)
Potassium: 4.2 mmol/L (ref 3.5–5.1)
Sodium: 136 mmol/L (ref 135–145)

## 2020-11-30 MED ORDER — QUETIAPINE FUMARATE 200 MG PO TABS: 200.0000 mg | ORAL_TABLET | Freq: Three times a day (TID) | ORAL | 0 refills | Status: DC

## 2020-11-30 MED ORDER — PREGABALIN 50 MG PO CAPS: 50.0000 mg | ORAL_CAPSULE | Freq: Three times a day (TID) | ORAL | 0 refills | Status: DC

## 2020-11-30 MED ORDER — PREGABALIN 100 MG PO CAPS: 100.0000 mg | ORAL_CAPSULE | Freq: Three times a day (TID) | ORAL | 0 refills | Status: DC

## 2020-11-30 MED ORDER — QUETIAPINE FUMARATE 200 MG PO TABS: 100.0000 mg | ORAL_TABLET | Freq: Three times a day (TID) | ORAL | 0 refills | Status: DC

## 2020-11-30 NOTE — Progress Notes (Addendum)
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  States he feels his pain will improve if he sleeps 8 hours continuously although the pain keeps him from sleeping more than a few hours at a time and that he has not slept well in over 2 years.  Notes medication he was given at 2 AM (IV diazepam per chart review) was very helpful for his pain and would like more of this medication as well as a hot shower.  Exam: Vitals:   11/30/20 0833 11/30/20 1158  BP: (!) 140/98 (!) 138/95  Pulse: (!) 103 (!) 101  Resp: 20 16  Temp: 98.3 F (36.8 C) 98.2 F (36.8 C)  SpO2: 99% 98%   Gen: In bed, NAD Resp: non-labored breathing, no acute distress Abd: soft, nt Psychiatric: Some tangential/moderately pressured speech but cooperative, calm   Neuro: MS: Awake, alert, oriented to month, year, situation, hospital.  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   Basic Metabolic Panel: Recent Labs  Lab 11/26/20 0157 11/26/20 2048 11/27/20 0554 11/28/20 0515 11/29/20 0502 11/30/20 0741  NA 129* 139 139 138 139 136  K 3.4* 2.8* 3.7 3.7 3.9 4.2  CL 96* 108 108 105 106 103  CO2 25 25 25 22 26 24   GLUCOSE 103* 78 96 82 100* 100*  BUN <5* <5* <5* <5* 9 10  CREATININE 0.75 0.82 0.85 0.68 0.71 0.69  CALCIUM 8.4* 8.4* 8.3* 8.8* 9.3 9.7  MG 1.9 2.1 2.1 1.8 2.0  --   PHOS 3.6  --  3.9 4.1  --   --     CBC: Recent Labs  Lab 11/25/20 2259 11/26/20 0157 11/26/20 2048 11/27/20 0554 11/28/20 0515 11/29/20 0502  WBC 11.0* 9.9 7.5 8.0 9.3 6.8  NEUTROABS 6.6  --   --   --   --   --   HGB 14.9 13.7 14.0 13.7 12.8* 13.0  HCT 42.1 38.0*  39.1 39.5 37.8* 38.1*  MCV 89.4 86.2 89.9 90.6 90.4 88.2  PLT 246 193 169 181 179 195    Coagulation Studies: No results for input(s): LABPROT, INR in the last 72 hours.    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  Do not feel the episodes he is describing at this time are consistent with seizure activity but rather more likely related to neuropathic pain, though based on chart review it does appear he may have presented with a first-time seizure on admission in the setting of hypertensive emergency and electrolyte derangements including hypokalemia and hyponatremia  Recommendations: - Continue Lyrica 50 mg 3 times daily (was on 600 mg/day prior), scheduled to increase to 100 three times daily on 10/19 - Appreciate psychiatry following, they have increased his Seroquel to 200 mg 3 times daily as of 10/16 and he appears to be tolerating this well - Appreciate neurosurgery's recommendations for hard collar when ambulating and outpatient consideration of further surgical management - Benzodiazepine management per primary team - No additional antiseizure medications at this time - Neurology will be available on an as-needed basis going forward, please reach out if new  questions or concerns arise  Brooke Dare MD-PhD [facility] [phone]  [facility] coverage for [facility] is from 8 AM to 4 AM in-house and 4 PM to 8 PM by telephone/video. 8 PM to 8 AM emergent questions or overnight urgent questions should be addressed to Teleneurology On-call or [facility] neurohospitalist; contact information can be found on AMION If 7pm- 7am, please page neurology on call as listed in AMION.  Greater than 35 minutes were spent in care of this patient today, greater than 50% of which was at bedside

## 2020-11-30 NOTE — Discharge Summary (Addendum)
Physician Discharge Summary  Scott Howell XLK:[id number] DOB: 1970/02/26 DOA: 11/25/2020  PCP: Kerman Passey, MD  Admit date: 11/25/2020 Discharge date: 11/30/2020  Discharge disposition: Home   Recommendations for Outpatient Follow-Up:   Follow-up with PCP in 1 week. Follow-up with neurologist in 1 month. Follow-up with [facility] (office will call to schedule) Follow-up with Dr. Adriana Howell, neurosurgeon (office will call to schedule    Discharge Diagnosis:   Principal Problem:   Bipolar 1 disorder, mixed (HCC) Active Problems:   Seizure (HCC)    Discharge Condition: Stable.  Diet recommendation:  Diet Order             Diet - low sodium heart healthy           DIET DYS 3 Room service appropriate? Yes; Fluid consistency: Thin  Diet effective now                     Code Status: Full Code     Hospital Course:   [name] is a 50 y.o. male with medical history significant for bipolar disorder, schizophrenia, polysubstance abuse, lumbar canal stenosis, chronic pain, hyperlipidemia, hypertension, stroke, COPD.  He was brought to the hospital because of witnessed seizure-like activity.  Reportedly, patient had another seizure-like activity in the emergency room lasting about 30 seconds.  He became bradycardic with heart rate in the 30s.  He also became postictal and was apparently violent and agitated.  He was admitted to the ICU for seizures.  He was intubated for acute hypoxemic respiratory failure.  He was treated with IV Keppra and diazepam.  He was evaluated by the neurologist and psychiatrist.  He was successfully extubated on 11/27/2020 and transferred to the hospitalist service on 11/28/2020.  He was given IV fluids for rhabdomyolysis.  He has history of cervical spine surgeries.  He complained of neck pain and numbness/tingling in the upper extremities.  MRI cervical spine revealed cervical spinal stenosis with severe foraminal narrowing at C3-C4.   He was evaluated by the neurosurgeon who recommended outpatient follow-up for further management.  His condition has improved and he is deemed stable for discharge to home. He said he does not want any prescription for his other home medicines because he has not been taking them for 2 years.   Medical Consultants:   Psychiatrist, intensivist, neurologist, neurosurgeon   Discharge Exam:    Vitals:   11/30/20 0455 11/30/20 0500 11/30/20 0833 11/30/20 1158  BP: (!) 133/91  (!) 140/98 (!) 138/95  Pulse: (!) 102  (!) 103 (!) 101  Resp: Temp: 98.4 F (36.9 C)  98.3 F (36.8 C) 98.2 F (36.8 C)  TempSrc: Oral     SpO2: 97%  99% 98%  Weight:  100.9 kg    Height:         GEN: NAD SKIN: No rash EYES: EOMI ENT: MMM CV: RRR PULM: CTA B ABD: soft, ND, NT, +BS CNS: AAO x 3, non focal EXT: No edema or tenderness PSYCH: Anxious but cooperative   The results of significant diagnostics from this hospitalization (including imaging, microbiology, ancillary and laboratory) are listed below for reference.     Procedures and Diagnostic Studies:   DG Abd 1 View  Result Date: 11/26/2020 CLINICAL DATA:  Orogastric tube placement EXAM: ABDOMEN - 1 VIEW COMPARISON:  None. FINDINGS: Limited radiograph of the lower chest and upper abdomen was obtained for the purposes of enteric tube localization. Enteric  tube is seen coursing below the diaphragm with distal tip and side port terminating within the expected location of the gastric body. IMPRESSION: Enteric tube within the gastric body. Electronically Signed   By: Duanne Guess D.O.   On: 11/26/2020 12:17   MR ANGIO HEAD WO CONTRAST  Result Date: 11/26/2020 CLINICAL DATA:  Stroke.  Altered mental status, seizure EXAM: MRI HEAD WITHOUT CONTRAST MRA HEAD WITHOUT CONTRAST TECHNIQUE: Multiplanar, multi-echo pulse sequences of the brain and surrounding structures were acquired without intravenous contrast. Angiographic images of the  Circle of Willis were acquired using MRA technique without intravenous contrast. COMPARISON:  CT head 11/25/2020 FINDINGS: MRI HEAD FINDINGS Brain: Negative for acute infarct.  Normal cerebellum. Ventricle size normal. Negative for hemorrhage or mass. Mild white matter changes with few small subcortical white matter hyperintensities in the right frontal lobe. Vascular: Normal arterial flow voids at the skull base. Skull and upper cervical spine: Negative Sinuses/Orbits: Mild mucosal edema paranasal sinuses. Negative orbit Other: None MRA HEAD FINDINGS Anterior circulation: Internal carotid artery widely patent. Anterior and middle cerebral arteries widely patent without stenosis or vascular malformation Posterior circulation: Left vertebral artery dominant widely patent. Left PICA patent. Right PICA patent. Distal to PICA, the right vertebral artery is atretic which may be congenital or due to stenosis which is severe. Basilar widely patent. Superior cerebellar and posterior cerebral arteries patent bilaterally without stenosis or vascular malformation. Anatomic variants: None IMPRESSION: 1. Negative for acute infarct 2. Mild white matter changes most likely due to chronic microvascular ischemia 3. Small distal right vertebral artery which may be due to acquired atherosclerotic stenosis versus congenital hypoplasia. Otherwise intracranial circulation widely patent. Electronically Signed   By: Marlan Palau M.D.   On: 11/26/2020 14:38   MR BRAIN WO CONTRAST  Result Date: 11/26/2020 CLINICAL DATA:  Stroke.  Altered mental status, seizure EXAM: MRI HEAD WITHOUT CONTRAST MRA HEAD WITHOUT CONTRAST TECHNIQUE: Multiplanar, multi-echo pulse sequences of the brain and surrounding structures were acquired without intravenous contrast. Angiographic images of the Circle of Willis were acquired using MRA technique without intravenous contrast. COMPARISON:  CT head 11/25/2020 FINDINGS: MRI HEAD FINDINGS Brain: Negative for  acute infarct.  Normal cerebellum. Ventricle size normal. Negative for hemorrhage or mass. Mild white matter changes with few small subcortical white matter hyperintensities in the right frontal lobe. Vascular: Normal arterial flow voids at the skull base. Skull and upper cervical spine: Negative Sinuses/Orbits: Mild mucosal edema paranasal sinuses. Negative orbit Other: None MRA HEAD FINDINGS Anterior circulation: Internal carotid artery widely patent. Anterior and middle cerebral arteries widely patent without stenosis or vascular malformation Posterior circulation: Left vertebral artery dominant widely patent. Left PICA patent. Right PICA patent. Distal to PICA, the right vertebral artery is atretic which may be congenital or due to stenosis which is severe. Basilar widely patent. Superior cerebellar and posterior cerebral arteries patent bilaterally without stenosis or vascular malformation. Anatomic variants: None IMPRESSION: 1. Negative for acute infarct 2. Mild white matter changes most likely due to chronic microvascular ischemia 3. Small distal right vertebral artery which may be due to acquired atherosclerotic stenosis versus congenital hypoplasia. Otherwise intracranial circulation widely patent. Electronically Signed   By: Marlan Palau M.D.   On: 11/26/2020 14:38   US Carotid Bilateral (at [facility] and [facility] only)  Result Date: 11/27/2020 CLINICAL DATA:  50 year old male with a history of stroke EXAM: BILATERAL CAROTID DUPLEX ULTRASOUND TECHNIQUE: Wallace Cullens scale imaging, color Doppler and duplex ultrasound were performed of bilateral carotid and vertebral arteries  in the neck. COMPARISON:  None. FINDINGS: Criteria: Quantification of carotid stenosis is based on velocity parameters that correlate the residual internal carotid diameter with NASCET-based stenosis levels, using the diameter of the distal internal carotid lumen as the denominator for stenosis measurement. The following velocity measurements were  obtained: RIGHT ICA:  Systolic 101 cm/sec, Diastolic 44 cm/sec CCA:  118 cm/sec SYSTOLIC ICA/CCA RATIO:  0.9 ECA:  146 cm/sec LEFT ICA:  Systolic 98 cm/sec, Diastolic 43 cm/sec CCA:  77 cm/sec SYSTOLIC ICA/CCA RATIO:  1.4 ECA:  136 cm/sec Right Brachial SBP: Not acquired Left Brachial SBP: Not acquired RIGHT CAROTID ARTERY: No significant calcified disease of the right common carotid artery. Intermediate waveform maintained. Heterogeneous plaque without significant calcifications at the right carotid bifurcation. Low resistance waveform of the right ICA. No significant tortuosity. RIGHT VERTEBRAL ARTERY: Antegrade flow with low resistance waveform. LEFT CAROTID ARTERY: No significant calcified disease of the left common carotid artery. Intermediate waveform maintained. Heterogeneous plaque at the left carotid bifurcation without significant calcifications. Low resistance waveform of the left ICA. LEFT VERTEBRAL ARTERY:  Antegrade flow with low resistance waveform. IMPRESSION: Color duplex indicates minimal heterogeneous plaque, with no hemodynamically significant stenosis by duplex criteria in the extracranial cerebrovascular circulation. Signed, Yvone Neu. Reyne Dumas, RPVI [facility] Radiology Electronically Signed   By: Gilmer Mor D.O.   On: 11/27/2020 12:25   EEG adult  Result Date: 11/26/2020 Rejeana Brock, MD     11/26/2020  8:44 PM History: 50 year old male with a history of new onset seizure Sedation: Propofol, fentanyl Technique: This EEG was acquired with electrodes placed according to the International 10-20 electrode system (including Fp1, Fp2, F3, F4, C3, C4, P3, P4, O1, O2, T3, T4, T5, T6, A1, A2, Fz, Cz, Pz). The following electrodes were missing or displaced: none. Background: The background consists of generally distributed 8-9 Hz alpha range activity with some brief 1 second attenuations.  There is also frontocentral predominant beta  activity seen throughout recording. Photic stimulation: Physiologic driving is not performed EEG Abnormalities: Sedated EEG Clinical Interpretation: This EEG is consistent with the patient's sedated state. There was no seizure or seizure predisposition recorded on this study. Please note that lack of epileptiform activity on EEG does not preclude the possibility of epilepsy. Ritta Slot, MD [facility] [phone] If 7pm- 7am, please page neurology on call as listed in AMION.   ECHOCARDIOGRAM COMPLETE  Result Date: 11/26/2020    ECHOCARDIOGRAM REPORT   Patient Name:   MONTAVIUS SUBRAMANIAM Date of Exam: 11/26/2020 Medical Rec #:  [id number]       Height:       73.0 in Accession #:    [id number]      Weight:       213.6 lb Date of Birth:  21-May-1970      BSA:          2.213 m Patient Age:    49 years        BP:           117/78 mmHg Patient Gender: M               HR:           65 bpm. Exam Location:  [facility] Procedure: 2D Echo, Color Doppler and Cardiac Doppler Indications:     I63.9 Stroke  History:         Patient has no prior history of Echocardiogram examinations.  COPD and Stroke; Risk Factors:HCL and Hypertension.  Sonographer:     Humphrey Rolls Referring Phys:  [id number] Hubbard Hartshorn OUMA Diagnosing Phys: Julien Nordmann MD  Sonographer Comments: Suboptimal parasternal window and echo performed with patient supine and on artificial respirator. IMPRESSIONS  1. Left ventricular ejection fraction, by estimation, is 60 to 65%. The left ventricle has normal function. The left ventricle has no regional wall motion abnormalities. Left ventricular diastolic parameters were normal.  2. Right ventricular systolic function is normal. The right ventricular size is normal.  3. The mitral valve is normal in structure. No evidence of mitral valve regurgitation. No evidence of mitral stenosis.  4. The aortic valve was not well visualized. Aortic valve regurgitation is not visualized. No aortic  stenosis is present.  5. The inferior vena cava is normal in size with greater than 50% respiratory variability, suggesting right atrial pressure of 3 mmHg. FINDINGS  Left Ventricle: Left ventricular ejection fraction, by estimation, is 60 to 65%. The left ventricle has normal function. The left ventricle has no regional wall motion abnormalities. The left ventricular internal cavity size was normal in size. There is  no left ventricular hypertrophy. Left ventricular diastolic parameters were normal. Right Ventricle: The right ventricular size is normal. No increase in right ventricular wall thickness. Right ventricular systolic function is normal. Left Atrium: Left atrial size was normal in size. Right Atrium: Right atrial size was normal in size. Pericardium: There is no evidence of pericardial effusion. Mitral Valve: The mitral valve is normal in structure. No evidence of mitral valve regurgitation. No evidence of mitral valve stenosis. MV peak gradient, 2.9 mmHg. The mean mitral valve gradient is 1.0 mmHg. Tricuspid Valve: The tricuspid valve is normal in structure. Tricuspid valve regurgitation is mild . No evidence of tricuspid stenosis. Aortic Valve: The aortic valve was not well visualized. Aortic valve regurgitation is not visualized. No aortic stenosis is present. Aortic valve mean gradient measures 4.0 mmHg. Aortic valve peak gradient measures 6.2 mmHg. Aortic valve area, by VTI measures 3.04 cm. Pulmonic Valve: The pulmonic valve was normal in structure. Pulmonic valve regurgitation is not visualized. No evidence of pulmonic stenosis. Aorta: The aortic root is normal in size and structure. Venous: The inferior vena cava is normal in size with greater than 50% respiratory variability, suggesting right atrial pressure of 3 mmHg. IAS/Shunts: No atrial level shunt detected by color flow Doppler.  LEFT VENTRICLE PLAX 2D LVIDd:         4.25 cm   Diastology LVIDs:         2.86 cm   LV e' medial:    8.92 cm/s  LV PW:         1.11 cm   LV E/e' medial:  8.6 LV IVS:        1.05 cm   LV e' lateral:   12.90 cm/s LVOT diam:     2.40 cm   LV E/e' lateral: 5.9 LV SV:         73 LV SV Index:   33 LVOT Area:     4.52 cm  RIGHT VENTRICLE RV Basal diam:  3.56 cm RV S prime:     8.16 cm/s LEFT ATRIUM             Index        RIGHT ATRIUM           Index LA diam:        3.90 cm 1.76 cm/m   RA  Area:     18.20 cm LA Vol (A2C):   48.8 ml 22.05 ml/m  RA Volume:   53.60 ml  24.22 ml/m LA Vol (A4C):   35.6 ml 16.09 ml/m LA Biplane Vol: 41.6 ml 18.80 ml/m  AORTIC VALVE                    PULMONIC VALVE AV Area (Vmax):    3.38 cm     PV Vmax:       0.64 m/s AV Area (Vmean):   2.97 cm     PV Vmean:      48.200 cm/s AV Area (VTI):     3.04 cm     PV VTI:        0.145 m AV Vmax:           124.00 cm/s  PV Peak grad:  1.6 mmHg AV Vmean:          89.400 cm/s  PV Mean grad:  1.0 mmHg AV VTI:            0.241 m AV Peak Grad:      6.2 mmHg AV Mean Grad:      4.0 mmHg LVOT Vmax:         92.70 cm/s LVOT Vmean:        58.700 cm/s LVOT VTI:          0.162 m LVOT/AV VTI ratio: 0.67  AORTA Ao Root diam: 3.30 cm MITRAL VALVE MV Area (PHT): 3.70 cm    SHUNTS MV Area VTI:   3.14 cm    Systemic VTI:  0.16 m MV Peak grad:  2.9 mmHg    Systemic Diam: 2.40 cm MV Mean grad:  1.0 mmHg MV Vmax:       0.85 m/s MV Vmean:      56.2 cm/s MV Decel Time: 205 msec MV E velocity: 76.70 cm/s MV A velocity: 56.60 cm/s MV E/A ratio:  1.36 Julien Nordmann MD Electronically signed by Julien Nordmann MD Signature Date/Time: 11/26/2020/1:17:36 PM    Final      Labs:   Basic Metabolic Panel: Recent Labs  Lab 11/26/20 0157 11/26/20 2048 11/27/20 0554 11/28/20 0515 11/29/20 0502 11/30/20 0741  NA 129* 139 139 138 139 136  K 3.4* 2.8* 3.7 3.7 3.9 4.2  CL 96* 108 108 105 106 103  CO2 25 25 25 22 26 24   GLUCOSE 103* 78 96 82 100* 100*  BUN <5* <5* <5* <5* 9 10  CREATININE 0.75 0.82 0.85 0.68 0.71 0.69  CALCIUM 8.4* 8.4* 8.3* 8.8* 9.3 9.7  MG 1.9 2.1 2.1  1.8 2.0  --   PHOS 3.6  --  3.9 4.1  --   --    GFR Estimated Creatinine Clearance: 138 mL/min (by C-G formula based on SCr of 0.69 mg/dL). Liver Function Tests: Recent Labs  Lab 11/25/20 2259  AST 67*  ALT 29  ALKPHOS 49  BILITOT 0.9  PROT 7.7  ALBUMIN 4.5   No results for input(s): LIPASE, AMYLASE in the last 168 hours. No results for input(s): AMMONIA in the last 168 hours. Coagulation profile Recent Labs  Lab 11/25/20 2259 11/26/20 0157  INR 1.1 1.0    CBC: Recent Labs  Lab 11/25/20 2259 11/26/20 0157 11/26/20 2048 11/27/20 0554 11/28/20 0515 11/29/20 0502  WBC 11.0* 9.9 7.5 8.0 9.3 6.8  NEUTROABS 6.6  --   --   --   --   --  HGB 14.9 13.7 14.0 13.7 12.8* 13.0  HCT 42.1 38.0* 39.1 39.5 37.8* 38.1*  MCV 89.4 86.2 89.9 90.6 90.4 88.2  PLT 246 193 169 181 179 195   Cardiac Enzymes: Recent Labs  Lab 11/26/20 2048 11/27/20 0554 11/28/20 0515 11/29/20 0502 11/30/20 0741  CKTOTAL 901* 604* 2,801* 3,845* 3,390*   BNP: Invalid input(s): POCBNP CBG: Recent Labs  Lab 11/26/20 2324 11/27/20 0332 11/27/20 0714 11/27/20 1126 11/27/20 1522  GLUCAP 83 100* 96 114* 119*   D-Dimer No results for input(s): DDIMER in the last 72 hours. Hgb A1c No results for input(s): HGBA1C in the last 72 hours. Lipid Profile No results for input(s): CHOL, HDL, LDLCALC, TRIG, CHOLHDL, LDLDIRECT in the last 72 hours. Thyroid function studies No results for input(s): TSH, T4TOTAL, T3FREE, THYROIDAB in the last 72 hours.  Invalid input(s): FREET3 Anemia work up No results for input(s): VITAMINB12, FOLATE, FERRITIN, TIBC, IRON, RETICCTPCT in the last 72 hours. Microbiology Recent Results (from the past 240 hour(s))  Resp Panel by RT-PCR (Flu A&B, Covid)     Status: None   Collection Time: 11/25/20 10:59 PM   Specimen: Nasopharyngeal(NP) swabs in vial transport medium  Result Value Ref Range Status   SARS Coronavirus 2 by RT PCR NEGATIVE NEGATIVE Final    Comment:  (NOTE) SARS-CoV-2 target nucleic acids are NOT DETECTED.  The SARS-CoV-2 RNA is generally detectable in upper respiratory specimens during the acute phase of infection. The lowest concentration of SARS-CoV-2 viral copies this assay can detect is 138 copies/mL. A negative result does not preclude SARS-Cov-2 infection and should not be used as the sole basis for treatment or other patient management decisions. A negative result may occur with  improper specimen collection/handling, submission of specimen other than nasopharyngeal swab, presence of viral mutation(s) within the areas targeted by this assay, and inadequate number of viral copies(<138 copies/mL). A negative result must be combined with clinical observations, patient history, and epidemiological information. The expected result is Negative.  Fact Sheet for Patients:  BloggerCourse.com  Fact Sheet for Healthcare Providers:  SeriousBroker.it  This test is no t yet approved or cleared by the [place] FDA and  has been authorized for detection and/or diagnosis of SARS-CoV-2 by FDA under an Emergency Use Authorization (EUA). This EUA will remain  in effect (meaning this test can be used) for the duration of the COVID-19 declaration under Section 564(b)(1) of the Act, 21 U.S.C.section 360bbb-3(b)(1), unless the authorization is terminated  or revoked sooner.       Influenza A by PCR NEGATIVE NEGATIVE Final   Influenza B by PCR NEGATIVE NEGATIVE Final    Comment: (NOTE) The Xpert Xpress SARS-CoV-2/FLU/RSV plus assay is intended as an aid in the diagnosis of influenza from Nasopharyngeal swab specimens and should not be used as a sole basis for treatment. Nasal washings and aspirates are unacceptable for Xpert Xpress SARS-CoV-2/FLU/RSV testing.  Fact Sheet for Patients: BloggerCourse.com  Fact Sheet for Healthcare  Providers: SeriousBroker.it  This test is not yet approved or cleared by the [place] FDA and has been authorized for detection and/or diagnosis of SARS-CoV-2 by FDA under an Emergency Use Authorization (EUA). This EUA will remain in effect (meaning this test can be used) for the duration of the COVID-19 declaration under Section 564(b)(1) of the Act, 21 U.S.C. section 360bbb-3(b)(1), unless the authorization is terminated or revoked.  Performed at  [facility], [address] [place], [place] [address]   Culture, blood (single) w Reflex to  ID Panel     Status: None (Preliminary result)   Collection Time: 11/26/20  1:57 AM   Specimen: BLOOD  Result Value Ref Range Status   Specimen Description BLOOD LEFT HAND  Final   Special Requests   Final    BOTTLES DRAWN AEROBIC AND ANAEROBIC Blood Culture results may not be optimal due to an excessive volume of blood received in culture bottles   Culture   Final    NO GROWTH 4 DAYS Performed at [facility], [address] [place], [place] [address]    Report Status PENDING  Incomplete  MRSA Next Gen by PCR, Nasal     Status: None   Collection Time: 11/26/20  9:10 PM   Specimen: Nasal Mucosa; Nasal Swab  Result Value Ref Range Status   MRSA by PCR Next Gen NOT DETECTED NOT DETECTED Final    Comment: (NOTE) The GeneXpert MRSA Assay (FDA approved for NASAL specimens only), is one component of a comprehensive MRSA colonization surveillance program. It is not intended to diagnose MRSA infection nor to guide or monitor treatment for MRSA infections. Test performance is not FDA approved in patients less than 23 years old. Performed at [facility], [address] [place], [place] [address]      Discharge Instructions:   Discharge Instructions     Ambulatory referral to Neurology   Complete by: As directed    An appointment is requested in approximately: 2 weeks   Diet -  low sodium heart healthy   Complete by: As directed    Increase activity slowly   Complete by: As directed    Schedule appointment   Complete by: As directed    Follow up with PCP in 1 week      Allergies as of 11/30/2020       Reactions   Acetaminophen Other (See Comments)   Pt states that it makes him hyper. Pt states he takes tylenol when needed.         Medication List     STOP taking these medications    diazepam 10 MG tablet Commonly known as: VALIUM   Diclofenac Sodium 3 % Gel   doxycycline 100 MG capsule Commonly known as: VIBRAMYCIN   potassium chloride 10 MEQ tablet Commonly known as: Klor-Con 10   predniSONE 50 MG tablet Commonly known as: DELTASONE   ranitidine 150 MG tablet Commonly known as: ZANTAC       TAKE these medications    amLODipine 5 MG tablet Commonly known as: NORVASC TAKE 1 TABLET BY MOUTH DAILY.   aspirin 81 MG tablet Take 81 mg by mouth.   atorvastatin 20 MG tablet Commonly known as: LIPITOR TAKE 1 TABLET BY MOUTH AT BEDTIME.   DULoxetine 60 MG capsule Commonly known as: CYMBALTA Take 60 mg by mouth 2 (two) times daily.   fluticasone 110 MCG/ACT inhaler Commonly known as: Flovent HFA Inhale 2 puffs into the lungs 2 (two) times daily.   levothyroxine 50 MCG tablet Commonly known as: SYNTHROID TAKE 1 TABLET BY MOUTH DAILY.   meloxicam 15 MG tablet Commonly known as: MOBIC Take 15 mg by mouth daily.   omeprazole 20 MG capsule Commonly known as: PRILOSEC Take 1 capsule (20 mg total) by mouth daily.   pregabalin 50 MG capsule Commonly known as: LYRICA Take 1 capsule (50 mg total) by mouth 3 (three) times daily for 1 day.   pregabalin 100 MG capsule Commonly known as: LYRICA Take 1 capsule (100  mg total) by mouth 3 (three) times daily. Start taking on: December 02, 2020   Proventil HFA 108 (90 Base) MCG/ACT inhaler Generic drug: albuterol INHALE 2 PUFFS INTO THE LUNGS EVERY FOUR HOURS AS NEEDED FOR WHEEZING  OR SHORTNESS OF BREATH   QUEtiapine 200 MG tablet Commonly known as: SEROQUEL Take 1 tablet (200 mg total) by mouth 3 (three) times daily. What changed:  medication strength how much to take when to take this additional instructions        Follow-up Information     [facility] Schedule an appointment as soon as possible for a visit in 1 week(s).   Why: Office has an emergency they will call patient to schedule appt  (per Vonna Kotyk) Contact information: [address] [place] [address] [phone]         [name] Chris, MD. Schedule an appointment as soon as possible for a visit in 1 week(s).   Specialty: Neurosurgery Why: Alphonzo Lemmings (at office) stated that they will reach out to patient to make follow up appt Contact information: [address] [place] [address] [phone]                   If you experience worsening of your admission symptoms, develop shortness of breath, life threatening emergency, suicidal or homicidal thoughts you must seek medical attention immediately by calling 911 or calling your MD immediately  if symptoms less severe.   You must read complete instructions/literature along with all the possible adverse reactions/side effects for all the medicines you take and that have been prescribed to you. Take any new medicines after you have completely understood and accept all the possible adverse reactions/side effects.    Please note   You were cared for by a hospitalist during your hospital stay. If you have any questions about your discharge medications or the care you received while you were in the hospital after you are discharged, you can call the unit and asked to speak with the hospitalist on call if the hospitalist that took care of you is not available. Once you are discharged, your primary care physician will handle any further medical issues. Please note that NO REFILLS for any discharge medications will be  authorized once you are discharged, as it is imperative that you return to your primary care physician (or establish a relationship with a primary care physician if you do not have one) for your aftercare needs so that they can reassess your need for medications and monitor your lab values.       Time coordinating discharge: 33 minutes  Signed:     [facility] 11/30/2020, 2:44 PM   Pager on [email or web address]. If 7PM-7AM, please contact night-coverage at [email or web address]

## 2020-11-30 NOTE — TOC Transition Note (Signed)
Transition of Care [facility]) - CM/SW Discharge Note   Patient Details  Name: Scott Howell MRN: [id number] Date of Birth: Jul 23, 1970  [facility] of [facility]) CM/SW Contact:  Shelbie Hutching, RN Phone Number: 11/30/2020, 3:14 PM   Clinical Narrative:    Patient is medially cleared for discharge home today.  RNCM met with patient at the bedside as TOC received a consult for substance abuse.  Patient lives in a pop up camper on his mother's property.  He showers and uses the restroom at his mother's house.  Patient is independent at home and walks with a walker.  He reports he is working on getting a new PCP, he has the numbers he just needs to call and see who is accepting new patients.  Patient has an extensive mental health history and reports being followed at [facility] but has not gone in over a year.  Patient liked [facility] and will probably return- encouraged follow up with one of the behavioral health providers outpatient and a list of providers given to patient. Patient reports smoking cigarettes and marijuana and has no intention of stopping either of them.    Patient's mother will pick him up today.     Final next level of care: Home/Self Care Barriers to Discharge: Barriers Resolved   Patient Goals and CMS Choice Patient states their goals for this hospitalization and ongoing recovery are:: Patient is ready to go home      Discharge Placement                       Discharge Plan and Services   Discharge Planning Services: CM Consult            DME Arranged: N/A DME Agency: NA       HH Arranged: NA HH Agency: NA        Social Determinants of Health (SDOH) Interventions     Readmission Risk Interventions No flowsheet data found.

## 2020-12-01 LAB — CULTURE, BLOOD (SINGLE): Culture: NO GROWTH

## 2020-12-16 ENCOUNTER — Encounter: Payer: Self-pay | Admitting: Internal Medicine

## 2020-12-16 ENCOUNTER — Ambulatory Visit (INDEPENDENT_AMBULATORY_CARE_PROVIDER_SITE_OTHER): Payer: Medicaid Other | Admitting: Internal Medicine

## 2020-12-16 ENCOUNTER — Other Ambulatory Visit: Payer: Self-pay

## 2020-12-16 VITALS — BP 116/78 | HR 95 | Temp 98.1°F | Resp 18 | Ht 73.0 in | Wt 215.4 lb

## 2020-12-16 DIAGNOSIS — Z13220 Encounter for screening for lipoid disorders: Secondary | ICD-10-CM

## 2020-12-16 DIAGNOSIS — M4802 Spinal stenosis, cervical region: Secondary | ICD-10-CM

## 2020-12-16 DIAGNOSIS — R569 Unspecified convulsions: Secondary | ICD-10-CM

## 2020-12-16 DIAGNOSIS — F3160 Bipolar disorder, current episode mixed, unspecified: Secondary | ICD-10-CM

## 2020-12-16 DIAGNOSIS — J449 Chronic obstructive pulmonary disease, unspecified: Secondary | ICD-10-CM

## 2020-12-16 DIAGNOSIS — T796XXS Traumatic ischemia of muscle, sequela: Secondary | ICD-10-CM | POA: Diagnosis not present

## 2020-12-16 DIAGNOSIS — I10 Essential (primary) hypertension: Secondary | ICD-10-CM

## 2020-12-16 MED ORDER — ALBUTEROL SULFATE HFA 108 (90 BASE) MCG/ACT IN AERS
2.0000 | INHALATION_SPRAY | Freq: Four times a day (QID) | RESPIRATORY_TRACT | 0 refills | Status: DC | PRN
Start: 2020-12-16 — End: 2020-12-28

## 2020-12-16 MED ORDER — BUDESONIDE 0.25 MG/2ML IN SUSP: 0.2500 mg | Freq: Every day | RESPIRATORY_TRACT | 12 refills | Status: DC

## 2020-12-16 NOTE — Patient Instructions (Signed)
It was great seeing you today!  Plan discussed at today's visit: -Blood work ordered today, results will be uploaded to MyChart.  -Follow up with Neurosurgery, Psychiatry and Neurology referral placed today -Inhalers sent to pharmacy, use Albuterol as needed and Pulmicort daily -Obtain a blood pressure cuff and check blood pressure every day, write this down and bring to your next appointment  Follow up in: 2 weeks or sooner as needed.  Take care and let us know if you have any questions or concerns prior to your next visit.  Dr. Caralee Ates

## 2020-12-16 NOTE — Progress Notes (Signed)
New Patient Office Visit  Subjective:  Patient ID: Scott Howell, male    DOB: 03/16/1970  Age: 50 y.o. MRN: [id number]  CC:  Chief Complaint  Patient presents with   Establish Care    Needs to discuss restarting meds, chol, bp, inhalers, thyroid    HPI Scott Howell presents for re-establishing care/hospital follow up. Chronic medical conditions include  bipolar disorder, schizophrenia, polysubstance abuse, lumbar canal stenosis, chronic pain, hyperlipidemia, hypertension, stroke, COPD.  The patient is a poor historian and presents alone to this visit.  He was recently discharged from the hospital on 11/30/20 after having seizure like activity. He was also bradycardic to the 30's upon admission. He was admitted to the ICU and intubated for 3 days. He was treated with IV Keppra and Diazepam. He was evaluated by neurosurgery for cervical spinal stenosis with severe foraminal narrowing at C3-C4 and will follow with them outpatient. He was also seen by psychiatry and neurology.  He did see his psychiatrist yesterday, he gave him prescriptions for Cymbalta 30 mg, Seroquel 200 mg 3 times daily, Lyrica 100 mg 3 times daily and Depakote  250 mg 3 times daily.  He is seeing psychiatry again on November 30 for follow-up.  He has been taking his psychiatric medications for 1 day now.  Regarding his seizures, he states he has had seizures on and off throughout his lifetime and had been on Keppra in the past, however he has not taken any medications in the last 2 years.  He does not currently have a neurologist.  He did have an EEG while in the ICU on 11/26/2020, which did not record any seizure-like activity at that time.  He has been back on the Depakote 250 mg 3 times daily since his discharge.  He denies any further seizure-like activity since being discharged from the hospital.  He states he is currently not driving and knows he should not be driving.  Upon presentation he did have a CT of his head  without contrast on 11/25/2020, which showed a possible subacute infarct within the left cerebral hemisphere.  Follow-up MRI of the head without contrast on 11/26/2020 was negative for acute infarct with mild white matter changes.  Carotid ultrasound demonstrating no significant stenosis bilaterally.  While inpatient, the patient was complaining of neck pain with numbness and tingling in his upper extremity.  MRI of the cervical spine showing multilevel degenerative changes with increased moderate to marked canal stenosis at C3-C4.  Patient was started on Lyrica and is following with neurosurgery to discuss surgical options tomorrow.  Also while inpatient he had rhabdomyolysis with CPK levels up to 3390 on day of discharge 2 weeks ago.  COPD: The patient currently smokes 30-35 cigarettes a day for the last 40 years.  He currently does not have any inhalers but endorses daily cough as cough with associated wheezing.  He has been on albuterol as needed and Pulmicort in the past.  He does have a history of hypertension on multiple medications in the past, today in the office his blood pressure is 116/78.  He denies headaches, dizziness, changes in vision.  He also has a history of hyperlipidemia but again has not taken any medications in the last 2 years.   Past Medical History:  Diagnosis Date   Bipolar 1 disorder (HCC)    COPD (chronic obstructive pulmonary disease) (HCC)    GERD (gastroesophageal reflux disease)    High cholesterol    Hypertension  Schizophrenia ([place])    Stroke [facility])     Past Surgical History:  Procedure Laterality Date   CERVICAL SPINE SURGERY     4   HARDWARE REMOVAL Left 04/20/2015   Procedure: HARDWARE REMOVAL left leg;  Surgeon: Earnestine Leys, MD;  Location: [facility] ORS;  Service: Orthopedics;  Laterality: Left;   KNEE SURGERY Left    SHOULDER SURGERY Left     Family History  Problem Relation Age of Onset   Diabetes Mother    Hyperlipidemia Mother    Hypertension  Mother    Diabetes Maternal Aunt    Cancer Maternal Aunt    Hyperlipidemia Maternal Aunt    Hypertension Maternal Aunt    Diabetes Maternal Uncle    Cancer Maternal Uncle    Hyperlipidemia Maternal Uncle    Hypertension Maternal Uncle     Social History   Socioeconomic History   Marital status: Single    Spouse name: Not on file   Number of children: Not on file   Years of education: Not on file   Highest education level: Not on file  Occupational History   Not on file  Tobacco Use   Smoking status: Every Day    Types: Cigarettes   Smokeless tobacco: Never  Substance and Sexual Activity   Alcohol use: No    Alcohol/week: 0.0 standard drinks    Comment: Sober for 12 yrs.   Drug use: No   Sexual activity: Never  Other Topics Concern   Not on file  Social History Narrative   Not on file   Social Determinants of Health   Financial Resource Strain: Not on file  Food Insecurity: Not on file  Transportation Needs: Not on file  Physical Activity: Not on file  Stress: Not on file  Social Connections: Not on file  Intimate Partner Violence: Not on file    ROS Review of Systems  Constitutional:  Positive for unexpected weight change. Negative for chills and fever.  Eyes:  Negative for visual disturbance.  Respiratory:  Positive for cough and wheezing.   Cardiovascular:  Negative for chest pain and palpitations.  Gastrointestinal:  Negative for abdominal pain, constipation, diarrhea, nausea and vomiting.  Musculoskeletal:  Positive for arthralgias and neck pain.  Neurological:  Positive for weakness and numbness. Negative for headaches.   Objective:   Today's Vitals: BP 116/78   Pulse 95   Temp 98.1 F (36.7 C)   Resp 18   Ht 6\' 1"  (1.854 m)   Wt 215 lb 6.4 oz (97.7 kg)   SpO2 98%   BMI 28.42 kg/m   Physical Exam Constitutional:      Appearance: Normal appearance.  HENT:     Head: Normocephalic and atraumatic.     Mouth/Throat:     Mouth: Mucous  membranes are moist.     Pharynx: Oropharynx is clear.  Eyes:     Extraocular Movements: Extraocular movements intact.     Conjunctiva/sclera: Conjunctivae normal.     Pupils: Pupils are equal, round, and reactive to light.  Neck:     Comments: Wearing a neck brace Cardiovascular:     Rate and Rhythm: Normal rate and regular rhythm.  Pulmonary:     Effort: Pulmonary effort is normal.     Breath sounds: Normal breath sounds.  Abdominal:     General: There is no distension.     Palpations: Abdomen is soft.     Tenderness: There is no abdominal tenderness.  Musculoskeletal:  Right lower leg: No edema.     Left lower leg: No edema.  Skin:    General: Skin is warm and dry.  Neurological:     General: No focal deficit present.     Mental Status: He is alert. Mental status is at baseline.  Psychiatric:     Comments: Pressured speech, tangential thinking     Assessment & Plan:   1. Seizure [facility]): Presented to ER with supposed witnessed seizures, he is currently on Depakote 250 mg TID without seizure like activity, referral to Neurology placed. Recheck labs today.   - CBC w/Diff/Platelet - COMPLETE METABOLIC PANEL WITH GFR - TSH - Ambulatory referral to Neurology  2. Cervical stenosis of spinal canal: Per MRI on 10/15, seeing Neurosurgery tomorrow. Currently on Lyrica and Cymbalta.  3. Traumatic rhabdomyolysis, sequela: Recheck CPK with blood work today.  - CK (Creatine Kinase)  4. Lipid screening: Recheck lipid panel today.   - Lipid Profile  5. Chronic obstructive pulmonary disease, unspecified COPD type ([place]): Albuterol as needed and Pulmicort daily. Still smoking.   - albuterol (VENTOLIN HFA) 108 (90 Base) MCG/ACT inhaler; Inhale 2 puffs into the lungs every 6 (six) hours as needed for wheezing or shortness of breath.  Dispense: 8 g; Refill: 0 - budesonide (PULMICORT) 0.25 MG/2ML nebulizer solution; Take 2 mLs (0.25 mg total) by nebulization daily.  Dispense: 60 mL;  Refill: 12  6. Bipolar 1 disorder, mixed ([place]): Seeing Psychiatry in 4 weeks, on Cymbalta, Seroquel.   7. Hypertension goal BP (blood pressure) < 140/90: Hx of HTN, blood pressure good today. We discussed checking blood pressure at home and he will follow up in 2 weeks to recheck.    Outpatient Encounter Medications as of 12/16/2020  Medication Sig   divalproex (DEPAKOTE) 250 MG DR tablet Take 250 mg by mouth 3 (three) times daily.   DULoxetine (CYMBALTA) 30 MG capsule Take 30 mg by mouth daily.   pregabalin (LYRICA) 100 MG capsule Take 1 capsule (100 mg total) by mouth 3 (three) times daily.   QUEtiapine (SEROQUEL) 200 MG tablet Take 200 mg by mouth 3 (three) times daily.   [DISCONTINUED] pregabalin (LYRICA) 100 MG capsule Take 100 mg by mouth 3 (three) times daily.   [DISCONTINUED] amLODipine (NORVASC) 5 MG tablet TAKE 1 TABLET BY MOUTH DAILY. (Patient not taking: Reported on 11/25/2020)   [DISCONTINUED] aspirin 81 MG tablet Take 81 mg by mouth. (Patient not taking: Reported on 11/25/2020)   [DISCONTINUED] atorvastatin (LIPITOR) 20 MG tablet TAKE 1 TABLET BY MOUTH AT BEDTIME. (Patient not taking: No sig reported)   [DISCONTINUED] DULoxetine (CYMBALTA) 60 MG capsule Take 60 mg by mouth 2 (two) times daily.  (Patient not taking: Reported on 11/25/2020)   [DISCONTINUED] fluticasone (FLOVENT HFA) 110 MCG/ACT inhaler Inhale 2 puffs into the lungs 2 (two) times daily. (Patient not taking: Reported on 11/25/2020)   [DISCONTINUED] levothyroxine (SYNTHROID, LEVOTHROID) 50 MCG tablet TAKE 1 TABLET BY MOUTH DAILY. (Patient not taking: No sig reported)   [DISCONTINUED] meloxicam (MOBIC) 15 MG tablet Take 15 mg by mouth daily. (Patient not taking: Reported on 11/25/2020)   [DISCONTINUED] omeprazole (PRILOSEC) 20 MG capsule Take 1 capsule (20 mg total) by mouth daily. (Patient not taking: Reported on 11/25/2020)   [DISCONTINUED] pregabalin (LYRICA) 50 MG capsule Take 1 capsule (50 mg total) by mouth 3  (three) times daily for 1 day.   [DISCONTINUED] PROVENTIL HFA 108 (90 Base) MCG/ACT inhaler INHALE 2 PUFFS INTO THE LUNGS EVERY FOUR HOURS AS  NEEDED FOR WHEEZING OR SHORTNESS OF BREATH   [DISCONTINUED] QUEtiapine (SEROQUEL) 200 MG tablet Take 1 tablet (200 mg total) by mouth 3 (three) times daily.   No facility-administered encounter medications on file as of 12/16/2020.    Follow-up: Return in about 2 weeks (around 12/30/2020).   Teodora Medici, DO

## 2020-12-17 ENCOUNTER — Other Ambulatory Visit: Payer: Self-pay | Admitting: Neurosurgery

## 2020-12-17 ENCOUNTER — Telehealth: Payer: Self-pay | Admitting: Internal Medicine

## 2020-12-17 DIAGNOSIS — J449 Chronic obstructive pulmonary disease, unspecified: Secondary | ICD-10-CM

## 2020-12-17 DIAGNOSIS — E7800 Pure hypercholesterolemia, unspecified: Secondary | ICD-10-CM

## 2020-12-17 LAB — CBC WITH DIFFERENTIAL/PLATELET
Absolute Monocytes: 461 cells/uL (ref 200–950)
Basophils Absolute: 28 cells/uL (ref 0–200)
Basophils Relative: 0.3 %
Eosinophils Absolute: 94 cells/uL (ref 15–500)
Eosinophils Relative: 1 %
HCT: 43.3 % (ref 38.5–50.0)
Hemoglobin: 14.8 g/dL (ref 13.2–17.1)
Lymphs Abs: 2247 cells/uL (ref 850–3900)
MCH: 30.3 pg (ref 27.0–33.0)
MCHC: 34.2 g/dL (ref 32.0–36.0)
MCV: 88.7 fL (ref 80.0–100.0)
MPV: 10.5 fL (ref 7.5–12.5)
Monocytes Relative: 4.9 %
Neutro Abs: 6571 cells/uL (ref 1500–7800)
Neutrophils Relative %: 69.9 %
Platelets: 289 10*3/uL (ref 140–400)
RBC: 4.88 10*6/uL (ref 4.20–5.80)
RDW: 13 % (ref 11.0–15.0)
Total Lymphocyte: 23.9 %
WBC: 9.4 10*3/uL (ref 3.8–10.8)

## 2020-12-17 LAB — COMPLETE METABOLIC PANEL WITH GFR
AG Ratio: 1.5 (calc) (ref 1.0–2.5)
ALT: 17 U/L (ref 9–46)
AST: 23 U/L (ref 10–35)
Albumin: 4.2 g/dL (ref 3.6–5.1)
Alkaline phosphatase (APISO): 57 U/L (ref 35–144)
BUN/Creatinine Ratio: 8 (calc) (ref 6–22)
BUN: 6 mg/dL — ABNORMAL LOW (ref 7–25)
CO2: 24 mmol/L (ref 20–32)
Calcium: 9.7 mg/dL (ref 8.6–10.3)
Chloride: 102 mmol/L (ref 98–110)
Creat: 0.77 mg/dL (ref 0.70–1.30)
Globulin: 2.8 g/dL (calc) (ref 1.9–3.7)
Glucose, Bld: 86 mg/dL (ref 65–99)
Potassium: 4.1 mmol/L (ref 3.5–5.3)
Sodium: 136 mmol/L (ref 135–146)
Total Bilirubin: 0.4 mg/dL (ref 0.2–1.2)
Total Protein: 7 g/dL (ref 6.1–8.1)
eGFR: 109 mL/min/{1.73_m2} (ref >=60)

## 2020-12-17 LAB — LIPID PANEL
Cholesterol: 195 mg/dL (ref <200)
HDL: 32 mg/dL — ABNORMAL LOW (ref >=40)
LDL Cholesterol (Calc): 134 mg/dL (calc) — ABNORMAL HIGH
Non-HDL Cholesterol (Calc): 163 mg/dL (calc) — ABNORMAL HIGH (ref <130)
Total CHOL/HDL Ratio: 6.1 (calc) — ABNORMAL HIGH (ref <5.0)
Triglycerides: 157 mg/dL — ABNORMAL HIGH (ref <150)

## 2020-12-17 LAB — CK: Total CK: 314 U/L — ABNORMAL HIGH (ref 44–196)

## 2020-12-17 LAB — TSH: TSH: 0.7 mIU/L (ref 0.40–4.50)

## 2020-12-17 MED ORDER — ATORVASTATIN CALCIUM 20 MG PO TABS: 20.0000 mg | ORAL_TABLET | Freq: Every day | ORAL | 3 refills | Status: DC

## 2020-12-17 NOTE — Telephone Encounter (Signed)
Pt is calling to report that his medicaid is [place] access not [facility] and he is requesting a referral to be sent to the surgeon  because he going to have surgery 01/13/21

## 2020-12-17 NOTE — Telephone Encounter (Signed)
Pt called to report that he is interested in taking the Lipitor 20 MG that he discussed with his PCP yesterday  [id number] - [place], [place] - [address]  [address] [place] [address]  Phone: [phone] Fax: [phone]

## 2020-12-21 MED ORDER — BUDESONIDE 0.5 MG/2ML IN SUSP: 0.5000 mg | Freq: Every day | RESPIRATORY_TRACT | 12 refills | Status: DC

## 2020-12-21 NOTE — Telephone Encounter (Signed)
Pt states ins will not cover nebulizer and he can not afford. Can you send in something else? A inhaler?

## 2020-12-28 ENCOUNTER — Other Ambulatory Visit: Payer: Self-pay | Admitting: Internal Medicine

## 2020-12-28 DIAGNOSIS — J449 Chronic obstructive pulmonary disease, unspecified: Secondary | ICD-10-CM

## 2020-12-28 NOTE — Telephone Encounter (Signed)
Pharmacy states they did not receive refill. Requested Prescriptions  Pending Prescriptions Disp Refills  . PROAIR HFA 108 (90 Base) MCG/ACT inhaler [Pharmacy Med Name: ProAir HFA 108 (90 Base)MCG/ACT AERS] 8.5 g 0    Sig: INHALE 2 PUFFS INTO LUNGS EVERY SIX HOURS AS NEEDED FOR WHEEZING     Pulmonology:  Beta Agonists Failed - 12/28/2020  9:23 AM      Failed - One inhaler should last at least one month. If the patient is requesting refills earlier, contact the patient to check for uncontrolled symptoms.      Passed - Valid encounter within last 12 months    Recent Outpatient Visits          1 week ago Seizure [facility])   [facility] Margarita Mail, DO   4 years ago Influenza-like illness   [facility] [place], Janit Bern, MD   4 years ago Elevated liver enzymes   [facility] Lada, Janit Bern, MD   5 years ago Chronic knee pain, left   [facility], Janit Bern, MD   5 years ago Erectile dysfunction, unspecified erectile dysfunction type   [facility] Edwena Felty, MD      Future Appointments            In 2 days Margarita Mail, DO [facility]

## 2020-12-30 ENCOUNTER — Other Ambulatory Visit: Payer: Self-pay

## 2020-12-30 ENCOUNTER — Ambulatory Visit: Payer: Medicaid Other | Admitting: Internal Medicine

## 2020-12-30 ENCOUNTER — Encounter: Payer: Self-pay | Admitting: Internal Medicine

## 2020-12-30 ENCOUNTER — Encounter
Admission: RE | Admit: 2020-12-30 | Discharge: 2020-12-30 | Disposition: A | Discharge status: Home / Self Care | Payer: Medicaid Other | Source: Ambulatory Visit | Attending: Neurosurgery | Admitting: Neurosurgery

## 2020-12-30 VITALS — BP 142/68 | HR 97 | Temp 98.3°F | Resp 16 | Ht 73.0 in | Wt 228.8 lb

## 2020-12-30 DIAGNOSIS — I10 Essential (primary) hypertension: Secondary | ICD-10-CM

## 2020-12-30 DIAGNOSIS — Z01812 Encounter for preprocedural laboratory examination: Secondary | ICD-10-CM | POA: Insufficient documentation

## 2020-12-30 DIAGNOSIS — T796XXS Traumatic ischemia of muscle, sequela: Secondary | ICD-10-CM | POA: Diagnosis not present

## 2020-12-30 DIAGNOSIS — R569 Unspecified convulsions: Secondary | ICD-10-CM | POA: Diagnosis not present

## 2020-12-30 DIAGNOSIS — K219 Gastro-esophageal reflux disease without esophagitis: Secondary | ICD-10-CM

## 2020-12-30 DIAGNOSIS — M4802 Spinal stenosis, cervical region: Secondary | ICD-10-CM | POA: Diagnosis not present

## 2020-12-30 DIAGNOSIS — Z23 Encounter for immunization: Secondary | ICD-10-CM | POA: Diagnosis not present

## 2020-12-30 DIAGNOSIS — E7800 Pure hypercholesterolemia, unspecified: Secondary | ICD-10-CM

## 2020-12-30 DIAGNOSIS — F3160 Bipolar disorder, current episode mixed, unspecified: Secondary | ICD-10-CM

## 2020-12-30 DIAGNOSIS — J449 Chronic obstructive pulmonary disease, unspecified: Secondary | ICD-10-CM

## 2020-12-30 HISTORY — DX: Paralytic syndrome, unspecified: G83.9

## 2020-12-30 HISTORY — DX: Unspecified asthma, uncomplicated: J45.909

## 2020-12-30 HISTORY — DX: Headache, unspecified: R51.9

## 2020-12-30 HISTORY — DX: Unspecified convulsions: R56.9

## 2020-12-30 HISTORY — DX: Unspecified osteoarthritis, unspecified site: M19.90

## 2020-12-30 HISTORY — DX: Dyspnea, unspecified: R06.00

## 2020-12-30 HISTORY — DX: Fibromyalgia: M79.7

## 2020-12-30 LAB — TYPE AND SCREEN
ABO/RH(D): O POS
Antibody Screen: NEGATIVE

## 2020-12-30 LAB — SURGICAL PCR SCREEN
MRSA, PCR: NEGATIVE
Staphylococcus aureus: NEGATIVE

## 2020-12-30 LAB — URINALYSIS, ROUTINE W REFLEX MICROSCOPIC
Bacteria, UA: NONE SEEN
Bilirubin Urine: NEGATIVE
Glucose, UA: NEGATIVE mg/dL
Hgb urine dipstick: NEGATIVE
Ketones, ur: 5 mg/dL — AB
Nitrite: NEGATIVE
Protein, ur: NEGATIVE mg/dL
Specific Gravity, Urine: 1.014 (ref 1.005–1.030)
pH: 6 (ref 5.0–8.0)

## 2020-12-30 MED ORDER — BUDESONIDE-FORMOTEROL FUMARATE 160-4.5 MCG/ACT IN AERO: 2.0000 | INHALATION_SPRAY | Freq: Two times a day (BID) | RESPIRATORY_TRACT | 3 refills | Status: DC

## 2020-12-30 MED ORDER — LISINOPRIL 10 MG PO TABS: 10.0000 mg | ORAL_TABLET | Freq: Every day | ORAL | 3 refills | Status: DC

## 2020-12-30 MED ORDER — FAMOTIDINE 20 MG PO TABS: 20.0000 mg | ORAL_TABLET | Freq: Two times a day (BID) | ORAL | 3 refills | Status: DC

## 2020-12-30 MED ORDER — RANITIDINE HCL 75 MG PO TABS: 75.0000 mg | ORAL_TABLET | Freq: Two times a day (BID) | ORAL | 3 refills | Status: DC

## 2020-12-30 NOTE — Patient Instructions (Addendum)
Your procedure is scheduled on: Wednesday 01/13/21 Report to the Registration Desk on the 1st floor of the Medical Mall. To find out your arrival time, please call [phone] between 1PM - 3PM on: Tuesday 01/12/21  REMEMBER: Instructions that are not followed completely may result in serious medical risk, up to and including death; or upon the discretion of your surgeon and anesthesiologist your surgery may need to be rescheduled.  Do not eat food after midnight the night before surgery.  No gum chewing, lozengers or hard candies.  You may however, drink CLEAR liquids up to 2 hours before you are scheduled to arrive for your surgery. Do not drink anything within 2 hours of your scheduled arrival time.  Clear liquids include: - water  - apple juice without pulp - gatorade (not RED, PURPLE, OR BLUE) - black coffee or tea (Do NOT add milk or creamers to the coffee or tea) Do NOT drink anything that is not on this list.  TAKE THESE MEDICATIONS THE MORNING OF SURGERY WITH A SIP OF WATER: atorvastatin (LIPITOR) 20 MG tablet divalproex (DEPAKOTE) 250 MG DR tablet DULoxetine (CYMBALTA) 30 MG capsule pregabalin (LYRICA) 100 MG capsule QUEtiapine (SEROQUEL) 200 MG tablet celecoxib (CELEBREX) 100 MG capsule  famotidine (PEPCID) 20 MG tablet (take one the night before and one on the morning of surgery - helps to prevent nausea after surgery.)  Use PROAIR HFA 108 (90 Base) MCG/ACT inhaler and budesonide-formoterol (SYMBICORT) 160-4.5 MCG/ACT inhaler inhalers on the day of surgery and bring to the hospital.  One week prior to surgery: Stop Anti-inflammatories (NSAIDS) such as Advil, Aleve, Ibuprofen, Motrin, Naproxen, Naprosyn and Aspirin based products such as Excedrin, Goodys Powder, BC Powder. Stop ANY OVER THE COUNTER supplements until after surgery. You may however, continue to take Tylenol if needed for pain up until the day of surgery.  No Alcohol for 24 hours before or after  surgery.  No Smoking including e-cigarettes for 24 hours prior to surgery.  No chewable tobacco products for at least 6 hours prior to surgery.  No nicotine patches on the day of surgery.  Do not use any "recreational" drugs for at least a week prior to your surgery.  Please be advised that the combination of cocaine and anesthesia may have negative outcomes, up to and including death. If you test positive for cocaine, your surgery will be cancelled.  On the morning of surgery brush your teeth with toothpaste and water, you may rinse your mouth with mouthwash if you wish. Do not swallow any toothpaste or mouthwash.  Use CHG Soap as directed on instruction sheet.  Do not wear jewelry.  Do not wear lotions, powders, or colognes.   Do not shave body from the neck down 48 hours prior to surgery just in case you cut yourself which could leave a site for infection.  Also, freshly shaved skin may become irritated if using the CHG soap.  Do not bring valuables to the hospital. [facility] is not responsible for any missing/lost belongings or valuables.   Notify your doctor if there is any change in your medical condition (cold, fever, infection).  Wear comfortable clothing (specific to your surgery type) to the hospital.  After surgery, you can help prevent lung complications by doing breathing exercises.  Take deep breaths and cough every 1-2 hours.   If you are being admitted to the hospital overnight, leave your suitcase in the car. After surgery it may be brought to your room.  If you  are taking public transportation, you will need to have a responsible adult (18 years or older) with you. Please confirm with your physician that it is acceptable to use public transportation.   Please call the Pre-admissions Testing Dept. at [phone] if you have any questions about these instructions.  Surgery Visitation Policy:  Patients undergoing a surgery or procedure may have one family  member or support person with them as long as that person is not COVID-19 positive or experiencing its symptoms.  That person may remain in the waiting area during the procedure and may rotate out with other people.  Inpatient Visitation:    Visiting hours are 7 a.m. to 8 p.m. Up to two visitors ages 16+ are allowed at one time in a patient room. The visitors may rotate out with other people during the day. Visitors must check out when they leave, or other visitors will not be allowed. One designated support person may remain overnight. The visitor must pass COVID-19 screenings, use hand sanitizer when entering and exiting the patient's room and wear a mask at all times, including in the patient's room. Patients must also wear a mask when staff or their visitor are in the room. Masking is required regardless of vaccination status.

## 2020-12-30 NOTE — Patient Instructions (Addendum)
It was great seeing you today!  Plan discussed at today's visit: -New medications include: Lisinopril 10 mg for blood pressure, Pepcid for acid reflux and Symbicort inhaler to use daily -Good luck with surgery!  Follow up in: 1 month  Take care and let us know if you have any questions or concerns prior to your next visit.  Dr. Caralee Ates

## 2020-12-30 NOTE — Progress Notes (Signed)
Established Patient Office Visit  Subjective:  Patient ID: Scott Howell, male    DOB: 1970-07-18  Age: 50 y.o. MRN: [id number]  CC:  Chief Complaint  Patient presents with   Follow-up    2 weeks    HPI Scott Howell presents for 2 week follow up. Chronic medical conditions include  bipolar disorder, schizophrenia, polysubstance abuse, lumbar canal stenosis, chronic pain, hyperlipidemia, hypertension, stroke, COPD.    Seizures: Prior to hospitalization currently on Depakote 250 mg TID. Doesn't have a Neurology appointment yet, blood work from last visit without electrolyte abnormalities.   Cervical stenosis of spinal canal: Seeing neurosurgery, planning for procedure 01/13/21. Currently on Lyrica and Cymbalta.   Rhabdomyolysis: during hospitalization, CPK 2 weeks ago trending dow, creatinine 0.77.  HLD: -Medications: started on Lipitor 20 mg 12/17/20 -Patient is compliant with above medications and reports no side effects.  -Last lipid panel: 11/2 - LDL 134, triglycerides 157, HDL 32, TC 195  COPD: -COPD status: stable -Current medications: Albuterol PRN -Satisfied with current treatment?:  getting back on inhalers -Oxygen use: no -Dyspnea frequency: shortness of breath occasionally, non-exertional  -Cough frequency: smoker's cough daily, white mucus -Rescue inhaler frequency: Currently using Albtuerol tiece a day  -Limitation of activity:  sometimes -Productive cough: yes -Pneumovax: Not up to Date, Prevnar 20 today -Influenza: Up to Date  Bipolar: Seeing Psychiatry, on Cymbalta, Seroquel.  Hypertension: -Medications: None -Checking BP at home (average): No, can't right now due to limitation with upper extremity -Denies any SOB, CP, vision changes, LE edema or symptoms of hypotension  Past Medical History:  Diagnosis Date   Bipolar 1 disorder (HCC)    COPD (chronic obstructive pulmonary disease) (HCC)    GERD (gastroesophageal reflux disease)    High  cholesterol    Hypertension    Schizophrenia ([place])    Stroke [facility])     Past Surgical History:  Procedure Laterality Date   CERVICAL SPINE SURGERY     4   HARDWARE REMOVAL Left 04/20/2015   Procedure: HARDWARE REMOVAL left leg;  Surgeon: Earnestine Leys, MD;  Location: [facility] ORS;  Service: Orthopedics;  Laterality: Left;   KNEE SURGERY Left    SHOULDER SURGERY Left     Family History  Problem Relation Age of Onset   Diabetes Mother    Hyperlipidemia Mother    Hypertension Mother    Diabetes Maternal Aunt    Cancer Maternal Aunt    Hyperlipidemia Maternal Aunt    Hypertension Maternal Aunt    Diabetes Maternal Uncle    Cancer Maternal Uncle    Hyperlipidemia Maternal Uncle    Hypertension Maternal Uncle     Social History   Socioeconomic History   Marital status: Single    Spouse name: Not on file   Number of children: Not on file   Years of education: Not on file   Highest education level: Not on file  Occupational History   Not on file  Tobacco Use   Smoking status: Every Day    Types: Cigarettes   Smokeless tobacco: Never  Substance and Sexual Activity   Alcohol use: No    Alcohol/week: 0.0 standard drinks    Comment: Sober for 12 yrs.   Drug use: No   Sexual activity: Never  Other Topics Concern   Not on file  Social History Narrative   Not on file   Social Determinants of Health   Financial Resource Strain: Not on file  Food Insecurity: Not on file  Transportation Needs: Not on file  Physical Activity: Not on file  Stress: Not on file  Social Connections: Not on file  Intimate Partner Violence: Not on file    Outpatient Medications Prior to Visit  Medication Sig Dispense Refill   atorvastatin (LIPITOR) 20 MG tablet Take 1 tablet (20 mg total) by mouth daily. 90 tablet 3   budesonide (PULMICORT) 0.5 MG/2ML nebulizer solution Take 2 mLs (0.5 mg total) by nebulization daily. 60 mL 12   divalproex (DEPAKOTE) 250 MG DR tablet Take 250 mg by mouth 3  (three) times daily.     DULoxetine (CYMBALTA) 30 MG capsule Take 30 mg by mouth daily.     pregabalin (LYRICA) 100 MG capsule Take 1 capsule (100 mg total) by mouth 3 (three) times daily. 90 capsule 0   PROAIR HFA 108 (90 Base) MCG/ACT inhaler INHALE 2 PUFFS INTO LUNGS EVERY SIX HOURS AS NEEDED FOR WHEEZING 8.5 g 0   QUEtiapine (SEROQUEL) 200 MG tablet Take 200 mg by mouth 3 (three) times daily.     No facility-administered medications prior to visit.    Allergies  Allergen Reactions   Acetaminophen Other (See Comments)    Pt states that it makes him hyper. Pt states he takes tylenol when needed.     ROS Review of Systems  Constitutional:  Negative for chills and unexpected weight change.  Eyes:  Negative for visual disturbance.  Respiratory:  Positive for cough and shortness of breath. Negative for wheezing.   Cardiovascular:  Negative for chest pain.  Gastrointestinal:  Negative for abdominal pain, nausea and vomiting.  Musculoskeletal:  Negative for myalgias.  Neurological:  Positive for numbness. Negative for dizziness, seizures and headaches.     Objective:    Physical Exam Constitutional:      Appearance: Normal appearance.  HENT:     Head: Normocephalic and atraumatic.  Eyes:     Conjunctiva/sclera: Conjunctivae normal.  Cardiovascular:     Rate and Rhythm: Normal rate and regular rhythm.  Pulmonary:     Effort: Pulmonary effort is normal.     Breath sounds: Normal breath sounds.  Musculoskeletal:     Right lower leg: No edema.     Left lower leg: No edema.  Skin:    General: Skin is warm and dry.  Neurological:     General: No focal deficit present.     Mental Status: He is alert. Mental status is at baseline.  Psychiatric:        Mood and Affect: Mood normal.        Behavior: Behavior normal.    BP (!) 142/68   Pulse 97   Temp 98.3 F (36.8 C)   Resp 16   Ht 6' 1"  (1.854 m)   Wt 228 lb 12.8 oz (103.8 kg)   SpO2 98%   BMI 30.19 kg/m  Wt Readings  from Last 3 Encounters:  12/16/20 215 lb 6.4 oz (97.7 kg)  11/30/20 222 lb 7.1 oz (100.9 kg)  12/24/16 230 lb (104.3 kg)     Health Maintenance Due  Topic Date Due   COVID-19 Vaccine (1) Never done   Pneumococcal Vaccine 38-79 Years old (2 - PCV) 10/03/2013   COLONOSCOPY (Pts 45-11yr Insurance coverage will need to be confirmed)  Never done    There are no preventive care reminders to display for this patient.  Lab Results  Component Value Date   TSH 0.70 12/16/2020   Lab Results  Component Value Date  WBC 9.4 12/16/2020   HGB 14.8 12/16/2020   HCT 43.3 12/16/2020   MCV 88.7 12/16/2020   PLT 289 12/16/2020   Lab Results  Component Value Date   NA 136 12/16/2020   K 4.1 12/16/2020   CO2 24 12/16/2020   GLUCOSE 86 12/16/2020   BUN 6 (L) 12/16/2020   CREATININE 0.77 12/16/2020   BILITOT 0.4 12/16/2020   ALKPHOS 49 11/25/2020   AST 23 12/16/2020   ALT 17 12/16/2020   PROT 7.0 12/16/2020   ALBUMIN 4.5 11/25/2020   CALCIUM 9.7 12/16/2020   ANIONGAP 9 11/30/2020   EGFR 109 12/16/2020   Lab Results  Component Value Date   CHOL 195 12/16/2020   Lab Results  Component Value Date   HDL 32 (L) 12/16/2020   Lab Results  Component Value Date   LDLCALC 134 (H) 12/16/2020   Lab Results  Component Value Date   TRIG 157 (H) 12/16/2020   Lab Results  Component Value Date   CHOLHDL 6.1 (H) 12/16/2020   Lab Results  Component Value Date   HGBA1C 5.8 (H) 11/26/2020      Assessment & Plan:   1. Seizures ([place]): Stable, no further seizure activity. Compliant with medications, waiting on Neurology referral.   2. Cervical stenosis of spinal canal: Planning for surgery 01/13/21.   3. Traumatic rhabdomyolysis, sequela: Resolved.   4. Hypertension goal BP (blood pressure) < 140/90: Blood pressure high today in the office, he will be restarted on Lisinopril 10 mg daily. Recheck in 1 month.   - lisinopril (ZESTRIL) 10 MG tablet; Take 1 tablet (10 mg total) by mouth  daily.  Dispense: 90 tablet; Refill: 3  5. Hypercholesteremia: Recently restarted on Lipitor 20 mg about 2 weeks ago, recheck in 6 months.  6. GERD without esophagitis: Had been on Zantac in past, will switch to Pepcid for GERD.  - famotidine (PEPCID) 20 MG tablet; Take 1 tablet (20 mg total) by mouth 2 (two) times daily.  Dispense: 90 tablet; Refill: 3  7. Chronic obstructive pulmonary disease, unspecified COPD type [facility]): Start LABA/ICS daily for COPD, Albuterol as needed.   - budesonide-formoterol (SYMBICORT) 160-4.5 MCG/ACT inhaler; Inhale 2 puffs into the lungs 2 (two) times daily.  Dispense: 1 each; Refill: 3  8. Bipolar 1 disorder, mixed ([place]): Stable, doing well on medications, following with Psychiatry.   9. Need for prophylactic vaccination with Streptococcus pneumoniae (Pneumococcus) and Influenza vaccines: Prevnar 20 vaccine administered today.   - Pneumococcal conjugate vaccine 20-valent (Prevnar 20)   Follow-up: Return in about 4 weeks (around 01/27/2021).    Teodora Medici, DO

## 2021-01-11 ENCOUNTER — Other Ambulatory Visit: Payer: Medicaid Other

## 2021-01-12 ENCOUNTER — Other Ambulatory Visit: Payer: Self-pay

## 2021-01-12 ENCOUNTER — Encounter
Admission: RE | Admit: 2021-01-12 | Discharge: 2021-01-12 | Disposition: A | Discharge status: Home / Self Care | Payer: Medicaid Other | Source: Ambulatory Visit | Attending: Neurosurgery | Admitting: Neurosurgery

## 2021-01-12 DIAGNOSIS — Z20822 Contact with and (suspected) exposure to covid-19: Secondary | ICD-10-CM | POA: Diagnosis not present

## 2021-01-12 DIAGNOSIS — Z01812 Encounter for preprocedural laboratory examination: Secondary | ICD-10-CM | POA: Diagnosis present

## 2021-01-13 ENCOUNTER — Encounter: Payer: Self-pay | Admitting: Neurosurgery

## 2021-01-13 ENCOUNTER — Other Ambulatory Visit: Payer: Self-pay

## 2021-01-13 ENCOUNTER — Ambulatory Visit: Payer: Medicaid Other

## 2021-01-13 ENCOUNTER — Observation Stay
Admission: RE | Admit: 2021-01-13 | Discharge: 2021-01-13 | Disposition: A | Discharge status: Home / Self Care | Payer: Medicaid Other | Source: Ambulatory Visit | Attending: Neurosurgery | Admitting: Neurosurgery

## 2021-01-13 ENCOUNTER — Encounter
Admission: RE | Disposition: A | Discharge status: Home / Self Care | Payer: Self-pay | Source: Ambulatory Visit | Attending: Neurosurgery

## 2021-01-13 ENCOUNTER — Ambulatory Visit: Payer: Medicaid Other | Admitting: Anesthesiology

## 2021-01-13 DIAGNOSIS — M5001 Cervical disc disorder with myelopathy,  high cervical region: Principal | ICD-10-CM | POA: Insufficient documentation

## 2021-01-13 DIAGNOSIS — M50021 Cervical disc disorder at C4-C5 level with myelopathy: Secondary | ICD-10-CM | POA: Insufficient documentation

## 2021-01-13 DIAGNOSIS — Z419 Encounter for procedure for purposes other than remedying health state, unspecified: Secondary | ICD-10-CM

## 2021-01-13 DIAGNOSIS — G959 Disease of spinal cord, unspecified: Secondary | ICD-10-CM | POA: Diagnosis present

## 2021-01-13 HISTORY — PX: ANTERIOR CERVICAL DECOMP/DISCECTOMY FUSION: SHX1161

## 2021-01-13 LAB — SARS CORONAVIRUS 2 (TAT 6-24 HRS): SARS Coronavirus 2: NEGATIVE

## 2021-01-13 LAB — ABO/RH: ABO/RH(D): O POS

## 2021-01-13 SURGERY — ANTERIOR CERVICAL DECOMPRESSION/DISCECTOMY FUSION 2 LEVELS
Anesthesia: General

## 2021-01-13 MED ORDER — SODIUM CHLORIDE 0.9 % IV SOLN: INTRAVENOUS | Status: DC

## 2021-01-13 MED ORDER — OXYCODONE HCL 5 MG PO TABS: 10.0000 mg | ORAL_TABLET | ORAL | Status: DC | PRN

## 2021-01-13 MED ORDER — HYDROMORPHONE HCL 1 MG/ML IJ SOLN
INTRAMUSCULAR | Status: DC | PRN
  Administered 2021-01-13 (×2): 1 mg via INTRAVENOUS

## 2021-01-13 MED ORDER — PHENYLEPHRINE HCL-NACL 20-0.9 MG/250ML-% IV SOLN
INTRAVENOUS | Status: DC | PRN
  Administered 2021-01-13: 30 ug/min via INTRAVENOUS

## 2021-01-13 MED ORDER — SODIUM CHLORIDE 0.9% FLUSH: 3.0000 mL | Freq: Two times a day (BID) | INTRAVENOUS | Status: DC

## 2021-01-13 MED ORDER — PHENYLEPHRINE HCL (PRESSORS) 10 MG/ML IV SOLN
INTRAVENOUS | Status: DC | PRN
  Administered 2021-01-13 (×2): 160 ug via INTRAVENOUS

## 2021-01-13 MED ORDER — OXYCODONE HCL 5 MG PO TABS: 5.0000 mg | ORAL_TABLET | Freq: Once | ORAL | Status: AC | PRN

## 2021-01-13 MED ORDER — 0.9 % SODIUM CHLORIDE (POUR BTL) OPTIME
TOPICAL | Status: DC | PRN
  Administered 2021-01-13: 1000 mL

## 2021-01-13 MED ORDER — OXYCODONE HCL 5 MG PO TABS: 5.0000 mg | ORAL_TABLET | ORAL | Status: DC | PRN

## 2021-01-13 MED ORDER — KETOROLAC TROMETHAMINE 15 MG/ML IJ SOLN
15.0000 mg | Freq: Four times a day (QID) | INTRAMUSCULAR | Status: DC
  Administered 2021-01-13: 15 mg via INTRAVENOUS

## 2021-01-13 MED ORDER — SODIUM CHLORIDE (PF) 0.9 % IJ SOLN
INTRAMUSCULAR | Status: AC
  Filled 2021-01-13: qty 40

## 2021-01-13 MED ORDER — METHOCARBAMOL 500 MG PO TABS
500.0000 mg | ORAL_TABLET | Freq: Four times a day (QID) | ORAL | 0 refills | Status: DC | PRN
Start: 2021-01-13 — End: 2021-09-29

## 2021-01-13 MED ORDER — OXYCODONE HCL 5 MG PO TABS
ORAL_TABLET | ORAL | Status: AC
  Administered 2021-01-13: 5 mg via ORAL
  Filled 2021-01-13: qty 1

## 2021-01-13 MED ORDER — PROPOFOL 10 MG/ML IV BOLUS
INTRAVENOUS | Status: AC
  Filled 2021-01-13: qty 20

## 2021-01-13 MED ORDER — ONDANSETRON HCL 4 MG/2ML IJ SOLN
INTRAMUSCULAR | Status: DC | PRN
  Administered 2021-01-13: 4 mg via INTRAVENOUS

## 2021-01-13 MED ORDER — MENTHOL 3 MG MT LOZG
1.0000 | LOZENGE | OROMUCOSAL | Status: DC | PRN
  Filled 2021-01-13: qty 9

## 2021-01-13 MED ORDER — CHLORHEXIDINE GLUCONATE 0.12 % MT SOLN
OROMUCOSAL | Status: AC
  Administered 2021-01-13: 15 mL via OROMUCOSAL
  Filled 2021-01-13: qty 15

## 2021-01-13 MED ORDER — LACTATED RINGERS IV SOLN: INTRAVENOUS | Status: DC | PRN

## 2021-01-13 MED ORDER — METHOCARBAMOL 500 MG PO TABS
500.0000 mg | ORAL_TABLET | Freq: Four times a day (QID) | ORAL | 0 refills | Status: DC | PRN
Start: 2021-01-13 — End: 2021-01-13

## 2021-01-13 MED ORDER — HYDROMORPHONE HCL 1 MG/ML IJ SOLN
INTRAMUSCULAR | Status: AC
  Administered 2021-01-13: 0.5 mg via INTRAVENOUS
  Filled 2021-01-13: qty 1

## 2021-01-13 MED ORDER — MIDAZOLAM HCL 2 MG/2ML IJ SOLN
INTRAMUSCULAR | Status: DC | PRN
  Administered 2021-01-13: 2 mg via INTRAVENOUS

## 2021-01-13 MED ORDER — REMIFENTANIL HCL 1 MG IV SOLR
INTRAVENOUS | Status: DC | PRN
  Administered 2021-01-13: .1 ug/kg/min via INTRAVENOUS

## 2021-01-13 MED ORDER — OXYCODONE HCL 5 MG/5ML PO SOLN: 5.0000 mg | Freq: Once | ORAL | Status: AC | PRN

## 2021-01-13 MED ORDER — HYDROMORPHONE HCL 1 MG/ML IJ SOLN
0.5000 mg | INTRAMUSCULAR | Status: DC | PRN
  Administered 2021-01-13: 0.5 mg via INTRAVENOUS

## 2021-01-13 MED ORDER — PROPOFOL 10 MG/ML IV BOLUS
INTRAVENOUS | Status: DC | PRN
  Administered 2021-01-13: 150 ug/kg/min via INTRAVENOUS

## 2021-01-13 MED ORDER — REMIFENTANIL HCL 1 MG IV SOLR
INTRAVENOUS | Status: AC
  Filled 2021-01-13: qty 1000

## 2021-01-13 MED ORDER — CEFAZOLIN SODIUM-DEXTROSE 2-4 GM/100ML-% IV SOLN
INTRAVENOUS | Status: AC
  Filled 2021-01-13: qty 100

## 2021-01-13 MED ORDER — MOMETASONE FURO-FORMOTEROL FUM 200-5 MCG/ACT IN AERO
2.0000 | INHALATION_SPRAY | Freq: Two times a day (BID) | RESPIRATORY_TRACT | Status: DC
  Filled 2021-01-13: qty 8.8

## 2021-01-13 MED ORDER — ATORVASTATIN CALCIUM 20 MG PO TABS
20.0000 mg | ORAL_TABLET | Freq: Every day | ORAL | Status: DC
  Filled 2021-01-13: qty 1

## 2021-01-13 MED ORDER — BUPIVACAINE-EPINEPHRINE (PF) 0.5% -1:200000 IJ SOLN
INTRAMUSCULAR | Status: AC
  Filled 2021-01-13: qty 30

## 2021-01-13 MED ORDER — METHOCARBAMOL 1000 MG/10ML IJ SOLN
500.0000 mg | Freq: Four times a day (QID) | INTRAVENOUS | Status: DC | PRN
  Administered 2021-01-13: 500 mg via INTRAVENOUS
  Filled 2021-01-13: qty 500
  Filled 2021-01-13: qty 5

## 2021-01-13 MED ORDER — FAMOTIDINE 20 MG PO TABS: 20.0000 mg | ORAL_TABLET | Freq: Two times a day (BID) | ORAL | Status: DC

## 2021-01-13 MED ORDER — MIDAZOLAM HCL 2 MG/2ML IJ SOLN
INTRAMUSCULAR | Status: AC
  Filled 2021-01-13: qty 2

## 2021-01-13 MED ORDER — LIDOCAINE HCL (CARDIAC) PF 100 MG/5ML IV SOSY
PREFILLED_SYRINGE | INTRAVENOUS | Status: DC | PRN
  Administered 2021-01-13: 100 mg via INTRAVENOUS

## 2021-01-13 MED ORDER — METHOCARBAMOL 500 MG PO TABS: 500.0000 mg | ORAL_TABLET | Freq: Four times a day (QID) | ORAL | Status: DC | PRN

## 2021-01-13 MED ORDER — SURGIFLO WITH THROMBIN (HEMOSTATIC MATRIX KIT) OPTIME
TOPICAL | Status: DC | PRN
  Administered 2021-01-13: 1 via TOPICAL

## 2021-01-13 MED ORDER — GLYCOPYRROLATE 0.2 MG/ML IJ SOLN
INTRAMUSCULAR | Status: DC | PRN
  Administered 2021-01-13: .2 mg via INTRAVENOUS

## 2021-01-13 MED ORDER — ALBUTEROL SULFATE (2.5 MG/3ML) 0.083% IN NEBU
3.0000 mL | INHALATION_SOLUTION | Freq: Four times a day (QID) | RESPIRATORY_TRACT | Status: DC | PRN
  Filled 2021-01-13: qty 3

## 2021-01-13 MED ORDER — PROPOFOL 10 MG/ML IV BOLUS
INTRAVENOUS | Status: DC | PRN
  Administered 2021-01-13: 200 mg via INTRAVENOUS
  Administered 2021-01-13 (×2): 100 mg via INTRAVENOUS

## 2021-01-13 MED ORDER — OXYCODONE HCL 5 MG PO TABS: 5.0000 mg | ORAL_TABLET | Freq: Four times a day (QID) | ORAL | 0 refills | Status: AC | PRN

## 2021-01-13 MED ORDER — CHLORHEXIDINE GLUCONATE 0.12 % MT SOLN: 15.0000 mL | Freq: Once | OROMUCOSAL | Status: AC

## 2021-01-13 MED ORDER — HYDROMORPHONE HCL 1 MG/ML IJ SOLN
INTRAMUSCULAR | Status: AC
  Filled 2021-01-13: qty 1

## 2021-01-13 MED ORDER — LISINOPRIL 10 MG PO TABS
10.0000 mg | ORAL_TABLET | Freq: Every day | ORAL | Status: DC
  Filled 2021-01-13: qty 1

## 2021-01-13 MED ORDER — DEXMEDETOMIDINE (PRECEDEX) IN NS 20 MCG/5ML (4 MCG/ML) IV SYRINGE
PREFILLED_SYRINGE | INTRAVENOUS | Status: DC | PRN
  Administered 2021-01-13: 20 ug via INTRAVENOUS

## 2021-01-13 MED ORDER — PHENOL 1.4 % MT LIQD
1.0000 | OROMUCOSAL | Status: DC | PRN
  Filled 2021-01-13: qty 177

## 2021-01-13 MED ORDER — SUCCINYLCHOLINE CHLORIDE 200 MG/10ML IV SOSY
PREFILLED_SYRINGE | INTRAVENOUS | Status: DC | PRN
  Administered 2021-01-13: 120 mg via INTRAVENOUS

## 2021-01-13 MED ORDER — KETOROLAC TROMETHAMINE 15 MG/ML IJ SOLN
INTRAMUSCULAR | Status: AC
  Filled 2021-01-13: qty 1

## 2021-01-13 MED ORDER — FENTANYL CITRATE (PF) 100 MCG/2ML IJ SOLN
25.0000 ug | INTRAMUSCULAR | Status: DC | PRN
  Administered 2021-01-13: 50 ug via INTRAVENOUS

## 2021-01-13 MED ORDER — SODIUM CHLORIDE 0.9% FLUSH: 3.0000 mL | INTRAVENOUS | Status: DC | PRN

## 2021-01-13 MED ORDER — FENTANYL CITRATE (PF) 100 MCG/2ML IJ SOLN
INTRAMUSCULAR | Status: AC
  Administered 2021-01-13: 50 ug via INTRAVENOUS
  Filled 2021-01-13: qty 2

## 2021-01-13 MED ORDER — DULOXETINE HCL 30 MG PO CPEP
30.0000 mg | ORAL_CAPSULE | Freq: Every day | ORAL | Status: DC
  Filled 2021-01-13: qty 1

## 2021-01-13 MED ORDER — DIVALPROEX SODIUM 250 MG PO DR TAB
250.0000 mg | DELAYED_RELEASE_TABLET | Freq: Three times a day (TID) | ORAL | Status: DC
  Filled 2021-01-13 (×3): qty 1

## 2021-01-13 MED ORDER — CEFAZOLIN SODIUM-DEXTROSE 2-4 GM/100ML-% IV SOLN
2.0000 g | INTRAVENOUS | Status: AC
  Administered 2021-01-13: 2 g via INTRAVENOUS

## 2021-01-13 MED ORDER — LACTATED RINGERS IV SOLN: INTRAVENOUS | Status: DC

## 2021-01-13 MED ORDER — POLYETHYLENE GLYCOL 3350 17 G PO PACK
17.0000 g | PACK | Freq: Every day | ORAL | Status: DC | PRN
  Filled 2021-01-13: qty 1

## 2021-01-13 MED ORDER — BISACODYL 10 MG RE SUPP
10.0000 mg | Freq: Every day | RECTAL | Status: DC | PRN
  Filled 2021-01-13: qty 1

## 2021-01-13 MED ORDER — OXYCODONE HCL 5 MG PO TABS: 5.0000 mg | ORAL_TABLET | ORAL | 0 refills | Status: DC | PRN

## 2021-01-13 MED ORDER — SODIUM CHLORIDE 0.9 % IV SOLN: 250.0000 mL | INTRAVENOUS | Status: DC

## 2021-01-13 MED ORDER — FENTANYL CITRATE (PF) 100 MCG/2ML IJ SOLN
INTRAMUSCULAR | Status: DC | PRN
  Administered 2021-01-13: 100 ug via INTRAVENOUS

## 2021-01-13 MED ORDER — BUPIVACAINE-EPINEPHRINE (PF) 0.5% -1:200000 IJ SOLN
INTRAMUSCULAR | Status: DC | PRN
  Administered 2021-01-13: 4 mL

## 2021-01-13 MED ORDER — SENNA 8.6 MG PO TABS
1.0000 | ORAL_TABLET | Freq: Two times a day (BID) | ORAL | Status: DC
  Filled 2021-01-13 (×2): qty 1

## 2021-01-13 MED ORDER — ONDANSETRON HCL 4 MG/2ML IJ SOLN: 4.0000 mg | Freq: Four times a day (QID) | INTRAMUSCULAR | Status: DC | PRN

## 2021-01-13 MED ORDER — DEXAMETHASONE SODIUM PHOSPHATE 10 MG/ML IJ SOLN
INTRAMUSCULAR | Status: DC | PRN
  Administered 2021-01-13: 10 mg via INTRAVENOUS

## 2021-01-13 MED ORDER — FLEET ENEMA 7-19 GM/118ML RE ENEM: 1.0000 | ENEMA | Freq: Once | RECTAL | Status: DC | PRN

## 2021-01-13 MED ORDER — ONDANSETRON HCL 4 MG PO TABS: 4.0000 mg | ORAL_TABLET | Freq: Four times a day (QID) | ORAL | Status: DC | PRN

## 2021-01-13 MED ORDER — ONDANSETRON HCL 4 MG/2ML IJ SOLN: 4.0000 mg | Freq: Once | INTRAMUSCULAR | Status: DC | PRN

## 2021-01-13 MED ORDER — FENTANYL CITRATE (PF) 100 MCG/2ML IJ SOLN
INTRAMUSCULAR | Status: AC
  Filled 2021-01-13: qty 2

## 2021-01-13 MED ORDER — ORAL CARE MOUTH RINSE: 15.0000 mL | Freq: Once | OROMUCOSAL | Status: AC

## 2021-01-13 SURGICAL SUPPLY — 62 items
BASKET BONE COLLECTION (BASKET) IMPLANT
BULB RESERV EVAC DRAIN JP 100C (MISCELLANEOUS) IMPLANT
BUR NEURO DRILL SOFT 3.0X3.8M (BURR) ×2 IMPLANT
CHLORAPREP W/TINT 26 (MISCELLANEOUS) ×4 IMPLANT
COUNTER NEEDLE 20/40 LG (NEEDLE) ×2 IMPLANT
CUP MEDICINE 2OZ PLAST GRAD ST (MISCELLANEOUS) ×2 IMPLANT
DERMABOND ADVANCED (GAUZE/BANDAGES/DRESSINGS) ×1
DERMABOND ADVANCED .7 DNX12 (GAUZE/BANDAGES/DRESSINGS) ×1 IMPLANT
DRAIN CHANNEL JP 10F RND 20C F (MISCELLANEOUS) IMPLANT
DRAPE C ARM PK CFD 31 SPINE (DRAPES) ×2 IMPLANT
DRAPE LAPAROTOMY 77X122 PED (DRAPES) ×2 IMPLANT
DRAPE MICROSCOPE SPINE 48X150 (DRAPES) ×2 IMPLANT
DRAPE SURG 17X11 SM STRL (DRAPES) ×8 IMPLANT
ELECT CAUTERY BLADE TIP 2.5 (TIP) ×2
ELECT REM PT RETURN 9FT ADLT (ELECTROSURGICAL) ×2
ELECTRODE CAUTERY BLDE TIP 2.5 (TIP) ×1 IMPLANT
ELECTRODE REM PT RTRN 9FT ADLT (ELECTROSURGICAL) ×1 IMPLANT
FEE INTRAOP CADWELL SUPPLY NCS (MISCELLANEOUS) ×1 IMPLANT
FEE INTRAOP MONITOR IMPULS NCS (MISCELLANEOUS) ×1 IMPLANT
GAUZE 4X4 16PLY ~~LOC~~+RFID DBL (SPONGE) ×2 IMPLANT
GLOVE SURG SYN 6.5 ES PF (GLOVE) ×4 IMPLANT
GLOVE SURG SYN 8.5  E (GLOVE) ×6
GLOVE SURG SYN 8.5 E (GLOVE) ×3 IMPLANT
GLOVE SURG UNDER POLY LF SZ6.5 (GLOVE) ×4 IMPLANT
GOWN SRG LRG LVL 4 IMPRV REINF (GOWNS) ×2 IMPLANT
GOWN SRG XL LVL 3 NONREINFORCE (GOWNS) ×1 IMPLANT
GOWN STRL NON-REIN TWL XL LVL3 (GOWNS) ×2
GOWN STRL REIN LRG LVL4 (GOWNS) ×4
GRADUATE 1200CC STRL 31836 (MISCELLANEOUS) ×2 IMPLANT
GRAFT DURAGEN MATRIX 1WX1L (Tissue) IMPLANT
INTRAOP CADWELL SUPPLY FEE NCS (MISCELLANEOUS) ×1
INTRAOP DISP SUPPLY FEE NCS (MISCELLANEOUS) ×2
INTRAOP MONITOR FEE IMPULS NCS (MISCELLANEOUS) ×1
INTRAOP MONITOR FEE IMPULSE (MISCELLANEOUS) ×2
KIT TURNOVER KIT A (KITS) ×2 IMPLANT
MANIFOLD NEPTUNE II (INSTRUMENTS) ×2 IMPLANT
MARKER SKIN DUAL TIP RULER LAB (MISCELLANEOUS) ×4 IMPLANT
NDL SAFETY ECLIPSE 18X1.5 (NEEDLE) ×1 IMPLANT
NEEDLE HYPO 18GX1.5 SHARP (NEEDLE) ×2
NEEDLE HYPO 22GX1.5 SAFETY (NEEDLE) ×2 IMPLANT
NS IRRIG 1000ML POUR BTL (IV SOLUTION) ×2 IMPLANT
PACK LAMINECTOMY NEURO (CUSTOM PROCEDURE TRAY) ×2 IMPLANT
PAD ARMBOARD 7.5X6 YLW CONV (MISCELLANEOUS) ×4 IMPLANT
PIN CASPAR 14 (PIN) ×1 IMPLANT
PIN CASPAR 14MM (PIN) ×2
PLATE ANT CERV XTEND 2 LV 32 (Plate) ×2 IMPLANT
PUTTY DBX 1CC (Putty) ×2 IMPLANT
PUTTY DBX 1CC DEPUY (Putty) ×1 IMPLANT
SCREW VAR 4.2 XD SELF DRILL 16 (Screw) ×12 IMPLANT
SPACER C HEDRON 12X14 7M 7D (Spacer) ×2 IMPLANT
SPACER HEDRON C 12X14X8 7D (Spacer) ×2 IMPLANT
SPONGE KITTNER 5P (MISCELLANEOUS) ×2 IMPLANT
STAPLER SKIN PROX 35W (STAPLE) ×2 IMPLANT
SURGIFLO W/THROMBIN 8M KIT (HEMOSTASIS) ×2 IMPLANT
SUT V-LOC 90 ABS DVC 3-0 CL (SUTURE) ×2 IMPLANT
SUT VIC AB 3-0 SH 8-18 (SUTURE) ×4 IMPLANT
SYR 30ML LL (SYRINGE) ×2 IMPLANT
TAPE CLOTH 3X10 WHT NS LF (GAUZE/BANDAGES/DRESSINGS) ×2 IMPLANT
TOWEL OR 17X26 4PK STRL BLUE (TOWEL DISPOSABLE) ×6 IMPLANT
TRAY FOLEY MTR SLVR 16FR STAT (SET/KITS/TRAYS/PACK) IMPLANT
TUBING CONNECTING 10 (TUBING) ×2 IMPLANT
WATER STERILE IRR 500ML POUR (IV SOLUTION) IMPLANT

## 2021-01-13 NOTE — Anesthesia Postprocedure Evaluation (Signed)
Anesthesia Post Note  Patient: Scott Howell  Procedure(s) Performed: C3-5 ANTERIOR CERVICAL DECOMPRESSION/DISCECTOMY FUSION  Patient location during evaluation: PACU Anesthesia Type: General Level of consciousness: awake and alert Pain management: pain level controlled Vital Signs Assessment: post-procedure vital signs reviewed and stable Respiratory status: spontaneous breathing, nonlabored ventilation, respiratory function stable and patient connected to nasal cannula oxygen Cardiovascular status: blood pressure returned to baseline and stable Postop Assessment: no apparent nausea or vomiting Anesthetic complications: no   No notable events documented.   Last Vitals:  Vitals:   01/13/21 1115 01/13/21 1116  BP: 117/79 117/79  Pulse: 62 76  Resp: 19 15  Temp:    SpO2: 95% 92%    Last Pain:  Vitals:   01/13/21 1116  TempSrc:   PainSc: 4                  Corinda Gubler

## 2021-01-13 NOTE — Transfer of Care (Signed)
Immediate Anesthesia Transfer of Care Note  Patient: Scott Howell  Procedure(s) Performed: C3-5 ANTERIOR CERVICAL DECOMPRESSION/DISCECTOMY FUSION  Patient Location: PACU  Anesthesia Type:General  Level of Consciousness: awake, alert  and oriented  Airway & Oxygen Therapy: Patient Spontanous Breathing  Post-op Assessment: Report given to RN and Post -op Vital signs reviewed and stable  Post vital signs: Reviewed and stable  Last Vitals:  Vitals Value Taken Time  BP    Temp    Pulse    Resp    SpO2      Last Pain:  Vitals:   01/13/21 0631  TempSrc: Oral         Complications: No notable events documented.

## 2021-01-13 NOTE — Discharge Instructions (Addendum)
Your surgeon has performed an operation on your cervical spine (neck) to relieve pressure on the spinal cord and/or nerves. This involved making an incision in the front of your neck and removing one or more of the discs that support your spine. Next, a small piece of bone, a titanium plate, and screws were used to fuse two or more of the vertebrae (bones) together.  The following are instructions to help in your recovery once you have been discharged from the hospital. Even if you feel well, it is important that you follow these activity guidelines. If you do not let your neck heal properly from the surgery, you can increase the chance of return of your symptoms and other complications.  * Do not take anti-inflammatory medications for 3 months after surgery (naproxen [Aleve], ibuprofen [Advil, Motrin], celecoxib [Celebrex], etc.). These medications can prevent your bones from healing properly.  Activity    No bending, lifting, or twisting ("BLT"). Avoid lifting objects heavier than 10 pounds (gallon milk jug).  Where possible, avoid household activities that involve lifting, bending, reaching, pushing, or pulling such as laundry, vacuuming, grocery shopping, and childcare. Try to arrange for help from friends and family for these activities while your back heals.  Increase physical activity slowly as tolerated.  Taking short walks is encouraged, but avoid strenuous exercise. Do not jog, run, bicycle, lift weights, or participate in any other exercises unless specifically allowed by your doctor.  Talk to your doctor before resuming sexual activity.  You should not drive until cleared by your doctor.  Until released by your doctor, you should not return to work or school.  You should rest at home and let your body heal.   You may shower three days after your surgery.  After showering, lightly dab your incision dry. Do not take a tub bath or go swimming until approved by your doctor at your  follow-up appointment.  If your doctor ordered a cervical collar (neck brace) for you, you should wear it whenever you are out of bed. You may remove it when lying down or sleeping, but you should wear it at all other times. Not all neck surgeries require a cervical collar.  If you smoke, we strongly recommend that you quit.  Smoking has been proven to interfere with normal bone healing and will dramatically reduce the success rate of your surgery. Please contact [facility] (800-QUIT-NOW) and use the resources at www.[facility].com for assistance in stopping smoking.  Surgical Incision   Keep your incision area clean and dry.  Your incision was closed with Dermabond glue. The glue should begin to peel away within about a week.  Diet           You may return to your usual diet. However, you may experience discomfort when swallowing in the first month after your surgery. This is normal. You may find that softer foods are more comfortable for you to swallow. Be sure to stay hydrated.  When to Contact us  You may experience pain in your neck and/or pain between your shoulder blades. This is normal and should improve in the next few weeks with the help of pain medication, muscle relaxers, and rest. Some patients report that a warm compress on the back of the neck or between the shoulder blades helps.  However, should you experience any of the following, contact us immediately: New numbness or weakness Pain that is progressively getting worse, and is not relieved by your pain medication, muscle relaxers, rest, and  warm compresses Bleeding, redness, swelling, pain, or drainage from surgical incision Chills or flu-like symptoms Fever greater than 101.0 F (38.3 C) Inability to eat, drink fluids, or take medications Problems with bowel or bladder functions Difficulty breathing or shortness of breath Warmth, tenderness, or swelling in your calf Contact Information During office hours  (Monday-Friday 9 am to 5 pm), please call your physician at [phone] and ask for Sharlot Gowda After hours and weekends, please call [phone] and speak with the answering service, who will contact the doctor on call.  If that fails, call the [facility] Operator at [phone] and ask for the Neurosurgery Resident On Call  For a life-threatening emergency, call 911   AMBULATORY SURGERY  DISCHARGE INSTRUCTIONS   The drugs that you were given will stay in your system until tomorrow so for the next 24 hours you should not:  Drive an automobile Make any legal decisions Drink any alcoholic beverage   You may resume regular meals tomorrow.  Today it is better to start with liquids and gradually work up to solid foods.  You may eat anything you prefer, but it is better to start with liquids, then soup and crackers, and gradually work up to solid foods.   Please notify your doctor immediately if you have any unusual bleeding, trouble breathing, redness and pain at the surgery site, drainage, fever, or pain not relieved by medication.    Additional Instructions:        Please contact your physician with any problems or Same Day Surgery at [phone], Monday through Friday 6 am to 4 pm, or [place] at [facility] number at [phone].

## 2021-01-13 NOTE — Progress Notes (Signed)
Nsg Discharge Note  Admit Date:  01/13/2021 Discharge date: 01/13/2021   [name] to be D/C'd Home per MD order.  AVS completed.   Patient/caregiver able to verbalize understanding.  Discharge Medication: Allergies as of 01/13/2021       Reactions   Acetaminophen Other (See Comments)   Pt states that it makes him hyper. Pt states he takes tylenol when needed.         Medication List     STOP taking these medications    pregabalin 100 MG capsule Commonly known as: LYRICA   QUEtiapine 200 MG tablet Commonly known as: SEROQUEL       TAKE these medications    atorvastatin 20 MG tablet Commonly known as: Lipitor Take 1 tablet (20 mg total) by mouth daily.   budesonide-formoterol 160-4.5 MCG/ACT inhaler Commonly known as: SYMBICORT Inhale 2 puffs into the lungs 2 (two) times daily.   celecoxib 100 MG capsule Commonly known as: CELEBREX Take by mouth.   divalproex 250 MG DR tablet Commonly known as: DEPAKOTE Take 250 mg by mouth 3 (three) times daily.   DULoxetine 30 MG capsule Commonly known as: CYMBALTA Take 30 mg by mouth daily.   famotidine 20 MG tablet Commonly known as: Pepcid Take 1 tablet (20 mg total) by mouth 2 (two) times daily.   lisinopril 10 MG tablet Commonly known as: ZESTRIL Take 1 tablet (10 mg total) by mouth daily.   methocarbamol 500 MG tablet Commonly known as: ROBAXIN Take 1 tablet (500 mg total) by mouth every 6 (six) hours as needed for muscle spasms.   oxyCODONE 5 MG immediate release tablet Commonly known as: Oxy IR/ROXICODONE Take 1 tablet (5 mg total) by mouth every 6 (six) hours as needed for up to 5 days for moderate pain ((score 4 to 6)).   ProAir HFA 108 (90 Base) MCG/ACT inhaler Generic drug: albuterol INHALE 2 PUFFS INTO LUNGS EVERY SIX HOURS AS NEEDED FOR WHEEZING        Discharge Assessment: Vitals:   01/13/21 1115 01/13/21 1116  BP: 117/79 117/79  Pulse: 62 76  Resp: 19 15  Temp:    SpO2: 95%  92%   Skin clean, dry and intact without evidence of skin break down, no evidence of skin tears noted. IV catheter discontinued intact. Site without signs and symptoms of complications - no redness or edema noted at insertion site, patient denies c/o pain - only slight tenderness at site.  Dressing with slight pressure applied.  D/c Instructions-Education: Discharge instructions given to patient/family with verbalized understanding. D/c education completed with patient/family including follow up instructions, medication list, d/c activities limitations if indicated, with other d/c instructions as indicated by MD - patient able to verbalize understanding, all questions fully answered. Patient instructed to return to ED, call 911, or call MD for any changes in condition.  Patient escorted via WC, and D/C home via [facility] medical transportation.  Theodore Demark, RN 01/13/2021 4:05 PM

## 2021-01-13 NOTE — Progress Notes (Signed)
PHARMACY -  BRIEF ANTIBIOTIC NOTE   Pharmacy has received consult(s) for Cefazolin from an OR provider.  The patient's profile has been reviewed for ht/wt/allergies/indication/available labs.    One time order(s) placed for Cefazolin 2 gm pre-op per pt wt: 102.4 kg.  Further antibiotics/pharmacy consults should be ordered by admitting physician if indicated.                       Thank you, Otelia Sergeant, PharmD, [facility] 01/13/2021 6:22 AM

## 2021-01-13 NOTE — Op Note (Signed)
Indications: Mr. Prats is a 50 yo male who presented with cervical myelopathy.  Due to weakness surgical intervention was recommended.  Findings: cervical stenosis  Preoperative Diagnosis: Cervical myelopathy Postoperative Diagnosis: same   EBL: 25 ml IVF: 1000 ml Drains: none Disposition: Extubated and Stable to PACU Complications: none  No foley catheter was placed.   Preoperative Note:   Risks of surgery discussed include: infection, bleeding, stroke, coma, death, paralysis, CSF leak, nerve/spinal cord injury, numbness, tingling, weakness, complex regional pain syndrome, recurrent stenosis and/or disc herniation, vascular injury, development of instability, neck/back pain, need for further surgery, persistent symptoms, development of deformity, and the risks of anesthesia. The patient understood these risks and agreed to proceed.  Operative Note:   Procedure:  1) Anterior cervical diskectomy and fusion at C3/4 and C4/5 2) Anterior cervical instrumentation at C3 - 5 using Globus Xtend 3) Placement of biomechanical devices at C3/4 and C4/5  4) Use of operative microscope 5) Use of flouroscopy   Procedure: After obtaining informed consent, the patient taken to the operating room, placed in supine position, general anesthesia induced.  The patient had a small shoulder roll placed behind their shoulders.  The patient received preop antibiotics and IV Decadron.  The patient had a neck incision outlined, was prepped and draped in usual sterile fashion. The incision was injected with local anesthetic.   An incision was opened, dissection taken down medial to the carotid artery and jugular vein, lateral to the trachea and esophagus.  The prevertebral fascia identified and a localizing x-ray demonstrated the correct level. The top of the prior plate was identified. The longus colli were dissected laterally, and self-retaining retractors placed to open the operative field. The microscope was  then brought into the field.  With this complete, distractor pins were placed in the vertebral bodies of C3 and C5. The distractor was placed, and the annuli at C3/4 and C4/5 were opened using a bovie.  Curettes and pituitary rongeurs used to remove the majority of disk, then the drill was used to remove the posterior osteophyte and begin the foraminotomies. The nerve hook was used to elevate the posterior longitudinal ligament, which was then removed with Kerrison rongeurs. The microblunt nerve hook could be passed out the foramina bilaterally at each level.   Meticulous hemostasis was obtained.  A biomechanical device (Globus Hedron 7 mm height x 14 mm width by 12 mm depth) was placed at C3/4. A second biomechanical device (Globus Hedron 8 mm height x 14 mm width by 12 mm depth) was placed at C4/5. Each device had been filled with allograft for aid in arthrodesis.  The caspar distractor was removed, and bone wax used for hemostasis. A 32 mm Globus Xtend plate was chosen.  Two screws placed in each vertebral body, respectively making sure the screws were behind the locking mechanism.  Final AP and lateral radiographs were taken.   With everything in good position, the wound was irrigated copiously with bacitracin-containing solution and meticulous hemostasis obtained.  Wound was closed in 2 layers using interrupted inverted 3-0 Vicryl sutures in the platysma and 3-0 monocryl on the dermis.  The wound was dressed with dermabond, the head of bed at 30 degrees, taken to recovery room in stable condition.  No new postop neurological deficits were identified.  Sponge and pattie counts were correct at the end of the procedure.     I performed the entire procedure with the assistance of Manning Charity PA as an Designer, television/film set.  Venetia Night MD

## 2021-01-13 NOTE — Anesthesia Preprocedure Evaluation (Signed)
Anesthesia Evaluation  Patient identified by MRN, date of birth, ID band Patient awake    Reviewed: Allergy & Precautions, NPO status , Patient's Chart, lab work & pertinent test results  History of Anesthesia Complications Negative for: history of anesthetic complications  Airway Mallampati: III  TM Distance: >3 FB Neck ROM: Limited    Dental  (+) Edentulous Upper, Edentulous Lower   Pulmonary asthma , neg sleep apnea, COPD,  COPD inhaler, Current SmokerPatient did not abstain from smoking.,    Pulmonary exam normal breath sounds clear to auscultation       Cardiovascular Exercise Tolerance: Good METShypertension, (-) CAD and (-) Past MI (-) dysrhythmias  Rhythm:Regular Rate:Normal - Systolic murmurs    Neuro/Psych  Headaches, Seizures -,  PSYCHIATRIC DISORDERS Bipolar Disorder Schizophrenia Patient denies stroke hx  Neuromuscular disease    GI/Hepatic GERD  ,(+)     substance abuse  marijuana use,   Endo/Other  neg diabetes  Renal/GU negative Renal ROS     Musculoskeletal  (+) Arthritis ,   Abdominal   Peds  Hematology   Anesthesia Other Findings Past Medical History: No date: Arthritis No date: Asthma No date: Bipolar 1 disorder (HCC) No date: COPD (chronic obstructive pulmonary disease) (HCC) No date: Dyspnea No date: Fibromyalgia No date: GERD (gastroesophageal reflux disease) No date: Headache No date: High cholesterol No date: Hypertension No date: 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 No date: Schizophrenia (HCC) No date: Seizures (HCC) No date: Stroke [facility])  Reproductive/Obstetrics                             Anesthesia Physical Anesthesia Plan  ASA: 3  Anesthesia Plan: General   Post-op Pain Management: Dilaudid IV   Induction: Intravenous  PONV Risk Score and Plan: 2 and Ondansetron, Dexamethasone and Midazolam  Airway Management Planned:  Oral ETT and Video Laryngoscope Planned  Additional Equipment: None  Intra-op Plan:   Post-operative Plan: Extubation in OR  Informed Consent: I have reviewed the patients History and Physical, chart, labs and discussed the procedure including the risks, benefits and alternatives for the proposed anesthesia with the patient or authorized representative who has indicated his/her understanding and acceptance.     Dental advisory given  Plan Discussed with: CRNA and Surgeon  Anesthesia Plan Comments: (Discussed risks of anesthesia with patient, including PONV, sore throat, lip/dental/eye damage. Rare risks discussed as well, such as cardiorespiratory and neurological sequelae, and allergic reactions. Discussed the role of CRNA in patient's perioperative care. Patient understands.)        Anesthesia Quick Evaluation

## 2021-01-13 NOTE — Progress Notes (Signed)
PT Cancellation Note  Patient Details Name: Scott Howell MRN: [id number] DOB: [birth year]   Cancelled Treatment:    Reason Eval/Treat Not Completed: Other (comment). Per chart review, pt declining any follow up therapies, DME needs, and knows appropriate exercises for surgery. PT spoke with RN, and she was agreeable to mobilize pt and contact PT if any acute PT needs arose or pt voiced concerns. PT to sign off at this time after confirming with RN pt has ambulated and no needs identified.   Olga Coaster PT, DPT 2:23 PM,01/13/21

## 2021-01-13 NOTE — Progress Notes (Signed)
Patient has tolerated food and fluids well with no trouble swallowing. Patient ambulated 366ft independently in hall with cane. Patient states desire to go home and states he has support that can stay with him tonight if needed.

## 2021-01-13 NOTE — Anesthesia Procedure Notes (Signed)
Procedure Name: Intubation Date/Time: 01/13/2021 7:30 AM Performed by: Clyde Lundborg, CRNA Pre-anesthesia Checklist: Patient identified, Emergency Drugs available, Suction available and Patient being monitored Patient Re-evaluated:Patient Re-evaluated prior to induction Oxygen Delivery Method: Circle system utilized Preoxygenation: Pre-oxygenation with 100% oxygen Induction Type: IV induction Ventilation: Mask ventilation without difficulty Laryngoscope Size: McGraph and 3 Grade View: Grade II Tube type: Oral Tube size: 7.0 mm Number of attempts: 1 Airway Equipment and Method: Video-laryngoscopy Placement Confirmation: ETT inserted through vocal cords under direct vision, positive ETCO2, breath sounds checked- equal and bilateral and CO2 detector Secured at: 21 cm Tube secured with: Tape Dental Injury: Teeth and Oropharynx as per pre-operative assessment

## 2021-01-13 NOTE — Discharge Summary (Signed)
Physician Discharge Summary  Patient ID: Scott Howell MRN: [id number] DOB/AGE: 1970/03/30 50 y.o.  Admit date: 01/13/2021 Discharge date: 01/13/2021  Admission Diagnoses: Cervical stenosis and myelopathy  Discharge Diagnoses:  Principal Problem:   Cervical myelopathy [facility])   Discharged Condition: good  Hospital Course:  Scott Howell is a 50 year old presenting with cervical stenosis and myelopathy as well as right upper extremity weakness.  He underwent a C3-5 ACDF.  His intraoperative course was uncomplicated.  He was admitted to the PACU postoperatively for monitoring.  He was able to ambulate 300 feet and was requesting discharge home.  He was sent home after ambulating, urinating, and tolerating p.o. intake.  He was given prescriptions for pain medications and muscle relaxers.  Consults: None  Significant Diagnostic Studies: none  Treatments: surgery: As above.  Please see separately dictated operative report for further details.  Discharge Exam: Blood pressure 117/79, pulse 76, temperature (!) 96.8 F (36 C), resp. rate 15, height 6\' 1"  (1.854 m), weight 97 kg, SpO2 92 %. CN II-XII grossly intact 5/5 throughout LUE 4- in right deltoid, tricep, and bicep.  3/5 in right hand grip and interossei. 5/5 in bilateral lower extremities. Disposition: Discharge disposition: 01-Home or Self Care      Discharge Instructions     Discharge patient   Complete by: As directed    Discharge disposition: 01-Home or Self Care   Discharge patient date: 01/13/2021   Incentive spirometry RT   Complete by: As directed       Allergies as of 01/13/2021       Reactions   Acetaminophen Other (See Comments)   Pt states that it makes him hyper. Pt states he takes tylenol when needed.         Medication List     STOP taking these medications    pregabalin 100 MG capsule Commonly known as: LYRICA   QUEtiapine 200 MG tablet Commonly known as: SEROQUEL       TAKE these  medications    atorvastatin 20 MG tablet Commonly known as: Lipitor Take 1 tablet (20 mg total) by mouth daily.   budesonide-formoterol 160-4.5 MCG/ACT inhaler Commonly known as: SYMBICORT Inhale 2 puffs into the lungs 2 (two) times daily.   celecoxib 100 MG capsule Commonly known as: CELEBREX Take by mouth.   divalproex 250 MG DR tablet Commonly known as: DEPAKOTE Take 250 mg by mouth 3 (three) times daily.   DULoxetine 30 MG capsule Commonly known as: CYMBALTA Take 30 mg by mouth daily.   famotidine 20 MG tablet Commonly known as: Pepcid Take 1 tablet (20 mg total) by mouth 2 (two) times daily.   lisinopril 10 MG tablet Commonly known as: ZESTRIL Take 1 tablet (10 mg total) by mouth daily.   methocarbamol 500 MG tablet Commonly known as: ROBAXIN Take 1 tablet (500 mg total) by mouth every 6 (six) hours as needed for muscle spasms.   oxyCODONE 5 MG immediate release tablet Commonly known as: Oxy IR/ROXICODONE Take 1 tablet (5 mg total) by mouth every 4 (four) hours as needed for moderate pain ((score 4 to 6)).   ProAir HFA 108 (90 Base) MCG/ACT inhaler Generic drug: albuterol INHALE 2 PUFFS INTO LUNGS EVERY SIX HOURS AS NEEDED FOR WHEEZING         Signed: 01/15/2021 01/13/2021, 3:39 PM

## 2021-01-13 NOTE — Progress Notes (Signed)
OT Cancellation Note  Patient Details Name: Scott Howell MRN: [id number] DOB: Jul 19, 1970   Cancelled Treatment:    Reason Eval/Treat Not Completed: OT screened, no needs identified, will sign off. Order received. Per chart review and discussion with treatment team, pt currently declining any follow up therapies and DME. Pt also reporting knowledge of appropriate exercises following surgery and RN reporting pt independent with functional mobility/transfers. OT to sign off at this time. Please re-consult should additional needs arise.  Matthew Folks, OTR/L [facility] [phone]

## 2021-01-13 NOTE — Progress Notes (Signed)
Met with the patient aty the bedside to discuss DC plan and needs He lives at home with his step dad and brother He uses Medicaid transportation and family to go where he needs to go He has DME at home and does not need additional He stated that he goes to [facility] for mental health counseling and would like a list of other resources, I provided him with a list of resources for mental health providers He stated that he does not need home health, he has always done it on his own and is familiar with the exercises to do, he stated that he has had several things done in the past to his back and neck [facility] He has a PCP in place He feels good and stated that he is ready to go home

## 2021-01-13 NOTE — H&P (Signed)
I have reviewed and confirmed my history and physical from 12/17/2020 with no additions or changes. Plan for C3-5 ACDF.  Risks and benefits reviewed.  Heart sounds normal no MRG. Chest Clear to Auscultation Bilaterally.

## 2021-01-27 ENCOUNTER — Other Ambulatory Visit: Payer: Self-pay

## 2021-01-27 ENCOUNTER — Ambulatory Visit: Payer: Medicaid Other | Admitting: Internal Medicine

## 2021-01-27 VITALS — BP 116/68 | HR 92 | Temp 98.1°F | Resp 18 | Ht 73.0 in | Wt 220.6 lb

## 2021-01-27 DIAGNOSIS — F3160 Bipolar disorder, current episode mixed, unspecified: Secondary | ICD-10-CM

## 2021-01-27 DIAGNOSIS — M25562 Pain in left knee: Secondary | ICD-10-CM

## 2021-01-27 DIAGNOSIS — I10 Essential (primary) hypertension: Secondary | ICD-10-CM | POA: Diagnosis not present

## 2021-01-27 DIAGNOSIS — E7800 Pure hypercholesterolemia, unspecified: Secondary | ICD-10-CM

## 2021-01-27 DIAGNOSIS — R569 Unspecified convulsions: Secondary | ICD-10-CM

## 2021-01-27 DIAGNOSIS — M961 Postlaminectomy syndrome, not elsewhere classified: Secondary | ICD-10-CM

## 2021-01-27 DIAGNOSIS — K219 Gastro-esophageal reflux disease without esophagitis: Secondary | ICD-10-CM | POA: Diagnosis not present

## 2021-01-27 DIAGNOSIS — J449 Chronic obstructive pulmonary disease, unspecified: Secondary | ICD-10-CM

## 2021-01-27 DIAGNOSIS — G8929 Other chronic pain: Secondary | ICD-10-CM

## 2021-01-27 MED ORDER — IPRATROPIUM-ALBUTEROL 0.5-2.5 (3) MG/3ML IN SOLN: 3.0000 mL | Freq: Four times a day (QID) | RESPIRATORY_TRACT | 1 refills | Status: DC | PRN

## 2021-01-27 NOTE — Patient Instructions (Addendum)
It was great seeing you today!  Plan discussed at today's visit: -Referral has been sent to [facility].  Please go ahead and call them to  get setup. P: [phone] -Psychiatry and Orthopedic referrals placed as well -Prescription for both breathing machine and medication sent to pharmacy  Follow up in: 3 months or sooner as needed.   Take care and let us know if you have any questions or concerns prior to your next visit.  Dr. Caralee Ates
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
## 2021-02-17 ENCOUNTER — Other Ambulatory Visit: Payer: Self-pay | Admitting: Internal Medicine

## 2021-02-17 NOTE — Telephone Encounter (Signed)
Medication Refill - Medication: seizure medication, patient does not know the name of medication, patient states it reflects in his chart   Has the patient contacted their pharmacy? No.  (Agent: If no, request that the patient contact the pharmacy for the refill. If patient does not wish to contact the pharmacy document the reason why and proceed with request.)  Patient states PCP has never prescribed this medication. Therapist prescribed medication and patient is no longer seeing therapist. Patient states PCP stated to call when he had only 2 pills left and she would refill.    Preferred Pharmacy (with phone number or street name):   [phone] [place], [place] - [address] Phone:  [phone]  Fax:  [phone]      Has the patient been seen for an appointment in the last year OR does the patient have an upcoming appointment? Yes.    Agent: Please be advised that RX refills may take up to 3 business days. We ask that you follow-up with your pharmacy.

## 2021-02-18 ENCOUNTER — Other Ambulatory Visit: Payer: Self-pay | Admitting: Internal Medicine

## 2021-02-18 DIAGNOSIS — J449 Chronic obstructive pulmonary disease, unspecified: Secondary | ICD-10-CM

## 2021-02-18 MED ORDER — DIVALPROEX SODIUM 250 MG PO DR TAB: 250.0000 mg | DELAYED_RELEASE_TABLET | Freq: Three times a day (TID) | ORAL | 0 refills | Status: DC

## 2021-02-18 NOTE — Telephone Encounter (Signed)
Patient states you told him you would refill once until he could get into neurology?

## 2021-02-18 NOTE — Telephone Encounter (Signed)
Patient called in to inform Dr Caralee Ates that he need the [facility] because he only have seizure medications for tonight. Please advise

## 2021-02-18 NOTE — Telephone Encounter (Signed)
Requested medications are due for refill today.  unsure  Requested medications are on the active medications list.  yes  Last refill. 12/16/2020  Future visit scheduled.   yes  Notes to clinic.  Medication not delegated. Listed as Teacher, early years/pre.    Requested Prescriptions  Pending Prescriptions Disp Refills   divalproex (DEPAKOTE) 250 MG DR tablet      Sig: Take 1 tablet (250 mg total) by mouth 3 (three) times daily.     Not Delegated - Neurology:  Anticonvulsants - Valproates Failed - 02/17/2021  2:10 PM      Failed - This refill cannot be delegated      Failed - Valproic Acid (serum) in normal range and within 360 days    No results found for: VALPROATE, VPAT        Passed - AST in normal range and within 360 days    AST  Date Value Ref Range Status  12/16/2020 23 10 - 35 U/L Final   SGOT(AST)  Date Value Ref Range Status  06/05/2014 38 U/L Final    Comment:    15-41 NOTE: New Reference Range  04/22/14           Passed - ALT in normal range and within 360 days    ALT  Date Value Ref Range Status  12/16/2020 17 9 - 46 U/L Final   SGPT (ALT)  Date Value Ref Range Status  06/05/2014 30 U/L Final    Comment:    17-63 NOTE: New Reference Range  04/22/14           Passed - HGB in normal range and within 360 days    Hemoglobin  Date Value Ref Range Status  12/16/2020 14.8 13.2 - 17.1 g/dL Final   HGB  Date Value Ref Range Status  06/05/2014 14.7 13.0 - 18.0 g/dL Final          Passed - PLT in normal range and within 360 days    Platelets  Date Value Ref Range Status  12/16/2020 289 140 - 400 Thousand/uL Final   Platelet  Date Value Ref Range Status  06/05/2014 236 150 - 440 x10 3/mm 3 Final          Passed - WBC in normal range and within 360 days    WBC  Date Value Ref Range Status  12/16/2020 9.4 3.8 - 10.8 Thousand/uL Final          Passed - HCT in normal range and within 360 days    HCT  Date Value Ref Range Status   12/16/2020 43.3 38.5 - 50.0 % Final  06/05/2014 41.8 40.0 - 52.0 % Final          Passed - Valid encounter within last 12 months    Recent Outpatient Visits           3 weeks ago Hypertension goal BP (blood pressure) < 140/90   [facility] Margarita Mail, DO   1 month ago Hypertension goal BP (blood pressure) < 140/90   [facility] Margarita Mail, DO   2 months ago Seizure [facility])   [facility] Margarita Mail, DO   4 years ago Influenza-like illness   [facility] Lada, Janit Bern, MD   5 years ago Elevated liver enzymes   [facility] Lada, Janit Bern, MD       Future Appointments  In 2 months Margarita Mail, DO [facility]

## 2021-02-18 NOTE — Telephone Encounter (Signed)
Pt has questions for clinic and wants to speak to someone soon. He runs out of his medication tonight  Best contact: [phone]

## 2021-02-19 NOTE — Telephone Encounter (Signed)
Requested Prescriptions  Pending Prescriptions Disp Refills   VENTOLIN HFA 108 (90 Base) MCG/ACT inhaler [Pharmacy Med Name: VENTOLIN HFA 90 MCG INHALER] 18 each 0    Sig: INHALE 2 PUFFS INTO LUNGS EVERY 6 HOURS AS NEEDED FOR WHEEZING     Pulmonology:  Beta Agonists Failed - 02/18/2021  2:12 PM      Failed - One inhaler should last at least one month. If the patient is requesting refills earlier, contact the patient to check for uncontrolled symptoms.      Passed - Valid encounter within last 12 months    Recent Outpatient Visits          3 weeks ago Hypertension goal BP (blood pressure) < 140/90   [place], DO   1 month ago Hypertension goal BP (blood pressure) < 140/90   [facility] Teodora Medici, DO   2 months ago Seizure [facility])   [place], DO   4 years ago Influenza-like illness   [facility] Lada, Satira Anis, MD   5 years ago Elevated liver enzymes   [place], Satira Anis, MD      Future Appointments            In 2 months Teodora Medici, [facility], [facility]

## 2021-03-04 ENCOUNTER — Other Ambulatory Visit: Payer: Self-pay | Admitting: Internal Medicine

## 2021-03-04 DIAGNOSIS — J449 Chronic obstructive pulmonary disease, unspecified: Secondary | ICD-10-CM

## 2021-03-04 NOTE — Telephone Encounter (Signed)
Requested medication (s) are due for refill today:   Requested medication (s) are on the active medication list: Yes  Last refill:  Ventolin  02/19/21  ,  Depakote 02/18/21  Future visit scheduled: Yes  Notes to clinic:  See requests.    Requested Prescriptions  Pending Prescriptions Disp Refills   VENTOLIN HFA 108 (90 Base) MCG/ACT inhaler [Pharmacy Med Name: VENTOLIN HFA 90 MCG INHALER] 18 each 0    Sig: INHALE 2 PUFFS INTO LUNGS EVERY 6 HOURS AS NEEDED FOR WHEEZING     Pulmonology:  Beta Agonists Failed - 03/04/2021  8:32 AM      Failed - One inhaler should last at least one month. If the patient is requesting refills earlier, contact the patient to check for uncontrolled symptoms.      Passed - Valid encounter within last 12 months    Recent Outpatient Visits           1 month ago Hypertension goal BP (blood pressure) < 140/90   [facility] Margarita Mail, DO   2 months ago Hypertension goal BP (blood pressure) < 140/90   [facility] Margarita Mail, DO   2 months ago Seizure [facility])   [facility] Margarita Mail, DO   4 years ago Influenza-like illness   [facility] Lada, Janit Bern, MD   5 years ago Elevated liver enzymes   [facility] Lada, Janit Bern, MD       Future Appointments             In 1 month Margarita Mail, DO [facility]             divalproex (DEPAKOTE) 250 MG DR tablet [Pharmacy Med Name: DIVALPROEX SOD DR 250 MG TAB] 90 tablet 0    Sig: TAKE 1 TABLET BY MOUTH 3 TIMES DAILY.     Not Delegated - Neurology:  Anticonvulsants - Valproates Failed - 03/04/2021  8:32 AM      Failed - This refill cannot be delegated      Failed - Valproic Acid (serum) in normal range and within 360 days    No results found for: VALPROATE, VPAT        Passed - AST in normal range and within 360 days    AST  Date Value Ref Range  Status  12/16/2020 23 10 - 35 U/L Final   SGOT(AST)  Date Value Ref Range Status  06/05/2014 38 U/L Final    Comment:    15-41 NOTE: New Reference Range  04/22/14           Passed - ALT in normal range and within 360 days    ALT  Date Value Ref Range Status  12/16/2020 17 9 - 46 U/L Final   SGPT (ALT)  Date Value Ref Range Status  06/05/2014 30 U/L Final    Comment:    17-63 NOTE: New Reference Range  04/22/14           Passed - HGB in normal range and within 360 days    Hemoglobin  Date Value Ref Range Status  12/16/2020 14.8 13.2 - 17.1 g/dL Final   HGB  Date Value Ref Range Status  06/05/2014 14.7 13.0 - 18.0 g/dL Final          Passed - PLT in normal range and within 360 days    Platelets  Date Value Ref Range Status  12/16/2020  289 140 - 400 Thousand/uL Final   Platelet  Date Value Ref Range Status  06/05/2014 236 150 - 440 x10 3/mm 3 Final          Passed - WBC in normal range and within 360 days    WBC  Date Value Ref Range Status  12/16/2020 9.4 3.8 - 10.8 Thousand/uL Final          Passed - HCT in normal range and within 360 days    HCT  Date Value Ref Range Status  12/16/2020 43.3 38.5 - 50.0 % Final  06/05/2014 41.8 40.0 - 52.0 % Final          Passed - Valid encounter within last 12 months    Recent Outpatient Visits           1 month ago Hypertension goal BP (blood pressure) < 140/90   [facility] Margarita Mail, DO   2 months ago Hypertension goal BP (blood pressure) < 140/90   [facility] Margarita Mail, DO   2 months ago Seizure [facility])   [facility] Margarita Mail, DO   4 years ago Influenza-like illness   [facility] Lada, Janit Bern, MD   5 years ago Elevated liver enzymes   [facility] Lada, Janit Bern, MD       Future Appointments             In 1 month Margarita Mail, DO [facility], [facility]

## 2021-03-09 ENCOUNTER — Telehealth: Payer: Self-pay

## 2021-03-09 NOTE — Telephone Encounter (Signed)
Copied from CRM [id number]. Topic: General - Other >> Mar 09, 2021  8:39 AM Jaquita Rector A wrote: Reason for CRM: Patient called in to inform Dr Caralee Ates that he will be having his knee replace on 04/12/21 any questions please call patient

## 2021-03-11 ENCOUNTER — Other Ambulatory Visit: Payer: Self-pay

## 2021-03-11 DIAGNOSIS — Z1211 Encounter for screening for malignant neoplasm of colon: Secondary | ICD-10-CM

## 2021-03-19 ENCOUNTER — Other Ambulatory Visit: Payer: Self-pay | Admitting: Family Medicine

## 2021-03-19 DIAGNOSIS — J449 Chronic obstructive pulmonary disease, unspecified: Secondary | ICD-10-CM

## 2021-03-24 ENCOUNTER — Encounter: Payer: Self-pay | Admitting: Orthopedic Surgery

## 2021-03-24 ENCOUNTER — Other Ambulatory Visit: Payer: Self-pay | Admitting: Orthopedic Surgery

## 2021-03-24 DIAGNOSIS — Z01818 Encounter for other preprocedural examination: Secondary | ICD-10-CM

## 2021-03-24 NOTE — H&P (Signed)
Scott Howell MRN:  [id number] DOB/SEX:  [birth year]/male  CHIEF COMPLAINT:  Painful left Knee  HISTORY: Patient is a 51 y.o. male presented with a history of pain in the left knee. Onset of symptoms was gradual starting several years ago with gradually worsening course since that time. Prior procedures on the knee include none. Patient has been treated conservatively with over-the-counter NSAIDs and activity modification. Patient currently rates pain in the knee at 10 out of 10 with activity. There is pain at night.  PAST MEDICAL HISTORY: Patient Active Problem List   Diagnosis Date Noted   Cervical myelopathy (HCC) 01/13/2021   Seizure (HCC) 11/26/2020   Abnormal thyroid blood test 04/06/2016   Encounter for screening for HIV 01/05/2016   Abnormal weight gain 01/05/2016   Chronic prescription benzodiazepine use 10/19/2015   Elevated liver enzymes 08/20/2015   Medication monitoring encounter 08/20/2015   Hypokalemia 08/20/2015   Erectile dysfunction 04/22/2015   Status post hardware removal 04/20/2015   Bipolar 2 disorder (HCC) 07/29/2014   Marijuana abuse 07/29/2014   Chronic knee pain 07/29/2014   Bipolar 1 disorder, mixed (HCC)    Drug abuse, opioid type (HCC) 06/28/2013   GERD without esophagitis 10/03/2012   Hypertension goal BP (blood pressure) < 140/90 10/03/2012   Chronic pain associated with significant psychosocial dysfunction 07/17/2012   Lumbar canal stenosis 04/09/2012   Hypercholesterolemia 01/06/2012   CN (constipation) 11/11/2011   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/2010   Decreased motor strength 09/30/2010   Current tobacco use 09/30/2010   H/O transient cerebral ischemia 09/09/2010   LBP (low back pain) 08/30/2010   Cervical spinal cord compression (HCC) 07/22/2010   Cervical post-laminectomy syndrome 07/22/2010   Chronic obstructive pulmonary disease (HCC) 02/23/2010   Past Medical History:  Diagnosis Date   Arthritis    Asthma    Bipolar 1 disorder  (HCC)    COPD (chronic obstructive pulmonary disease) (HCC)    Dyspnea    Fibromyalgia    GERD (gastroesophageal reflux disease)    Headache    High cholesterol    Hypertension    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   Schizophrenia (HCC)    Seizures (HCC)    Stroke [facility])    Past Surgical History:  Procedure Laterality Date   ANTERIOR CERVICAL DECOMP/DISCECTOMY FUSION N/A 01/13/2021   Procedure: C3-5 ANTERIOR CERVICAL DECOMPRESSION/DISCECTOMY FUSION;  Surgeon: Venetia Night, MD;  Location: [facility] ORS;  Service: Neurosurgery;  Laterality: N/A;   CERVICAL SPINE SURGERY     4   HARDWARE REMOVAL Left 04/20/2015   Procedure: HARDWARE REMOVAL left leg;  Surgeon: Deeann Saint, MD;  Location: [facility] ORS;  Service: Orthopedics;  Laterality: Left;   KNEE SURGERY Left    SHOULDER SURGERY Left      MEDICATIONS:  (Not in a hospital admission)   ALLERGIES:   Allergies  Allergen Reactions   Acetaminophen Other (See Comments)    Pt states that it makes him hyper. Pt states he takes tylenol when needed.     REVIEW OF SYSTEMS:  Pertinent items are noted in HPI.   FAMILY HISTORY:   Family History  Problem Relation Age of Onset   Diabetes Mother    Hyperlipidemia Mother    Hypertension Mother    Diabetes Maternal Aunt    Cancer Maternal Aunt    Hyperlipidemia Maternal Aunt    Hypertension Maternal Aunt    Diabetes Maternal Uncle    Cancer Maternal Uncle    Hyperlipidemia  Maternal Uncle    Hypertension Maternal Uncle     SOCIAL HISTORY:   Social History   Tobacco Use   Smoking status: Every Day    Packs/day: 1.50    Years: 30.00    Pack years: 45.00    Types: Cigarettes   Smokeless tobacco: Never  Substance Use Topics   Alcohol use: No    Comment: Sober for 10 yrs.     EXAMINATION:  Vital signs in last 24 hours: @VSRANGES @  General appearance: alert, cooperative, and no distress Neck: no JVD and supple, symmetrical, trachea midline Lungs: clear to  auscultation bilaterally Heart: regular rate and rhythm, S1, S2 normal, no murmur, click, rub or gallop Abdomen: soft, non-tender; bowel sounds normal; no masses,  no organomegaly Extremities: extremities normal, atraumatic, no cyanosis or edema and Homans sign is negative, no sign of DVT Pulses: 2+ and symmetric Skin: Skin color, texture, turgor normal. No rashes or lesions  Musculoskeletal:  ROM 0-110, Ligaments intact,  Imaging Review Plain radiographs demonstrate severe degenerative joint disease of the left knee. The overall alignment is significant varus. The bone quality appears to be good for age and reported activity level.  Assessment/Plan: Primary osteoarthritis, left knee   The patient history, physical examination and imaging studies are consistent with advanced degenerative joint disease of the left knee. The patient has failed conservative treatment.  The clearance notes were reviewed.  After discussion with the patient it was felt that Total Knee Replacement was indicated. The procedure,  risks, and benefits of total knee arthroplasty were presented and reviewed. The risks including but not limited to aseptic loosening, infection, blood clots, vascular injury, stiffness, patella tracking problems complications among others were discussed. The patient acknowledged the explanation, agreed to proceed with the plan.  03/24/2021, 8:58 AM

## 2021-03-26 ENCOUNTER — Other Ambulatory Visit: Payer: Self-pay | Admitting: Internal Medicine

## 2021-03-26 ENCOUNTER — Other Ambulatory Visit: Payer: Self-pay | Admitting: Family Medicine

## 2021-03-26 DIAGNOSIS — J449 Chronic obstructive pulmonary disease, unspecified: Secondary | ICD-10-CM

## 2021-03-29 ENCOUNTER — Encounter
Admission: RE | Admit: 2021-03-29 | Discharge: 2021-03-29 | Disposition: A | Discharge status: Home / Self Care | Payer: Medicaid Other | Source: Ambulatory Visit | Attending: Orthopedic Surgery | Admitting: Orthopedic Surgery

## 2021-03-29 ENCOUNTER — Other Ambulatory Visit: Payer: Self-pay

## 2021-03-29 ENCOUNTER — Encounter: Payer: Self-pay | Admitting: Orthopedic Surgery

## 2021-03-29 VITALS — BP 113/73 | HR 78 | Resp 18 | Ht 73.0 in | Wt 220.0 lb

## 2021-03-29 DIAGNOSIS — Z01812 Encounter for preprocedural laboratory examination: Secondary | ICD-10-CM | POA: Insufficient documentation

## 2021-03-29 DIAGNOSIS — Z01818 Encounter for other preprocedural examination: Secondary | ICD-10-CM

## 2021-03-29 LAB — TYPE AND SCREEN
ABO/RH(D): O POS
Antibody Screen: NEGATIVE

## 2021-03-29 LAB — CBC
HCT: 40.5 % (ref 39.0–52.0)
Hemoglobin: 14.4 g/dL (ref 13.0–17.0)
MCH: 30.9 pg (ref 26.0–34.0)
MCHC: 35.6 g/dL (ref 30.0–36.0)
MCV: 86.9 fL (ref 80.0–100.0)
Platelets: 230 10*3/uL (ref 150–400)
RBC: 4.66 MIL/uL (ref 4.22–5.81)
RDW: 13.6 % (ref 11.5–15.5)
WBC: 7.7 10*3/uL (ref 4.0–10.5)
nRBC: 0 % (ref 0.0–0.2)

## 2021-03-29 LAB — URINALYSIS, ROUTINE W REFLEX MICROSCOPIC
Bilirubin Urine: NEGATIVE
Glucose, UA: NEGATIVE mg/dL
Hgb urine dipstick: NEGATIVE
Ketones, ur: NEGATIVE mg/dL
Leukocytes,Ua: NEGATIVE
Nitrite: NEGATIVE
Protein, ur: NEGATIVE mg/dL
Specific Gravity, Urine: 1.009 (ref 1.005–1.030)
pH: 6 (ref 5.0–8.0)

## 2021-03-29 LAB — BASIC METABOLIC PANEL
Anion gap: 6 (ref 5–15)
BUN: 9 mg/dL (ref 6–20)
CO2: 25 mmol/L (ref 22–32)
Calcium: 9.5 mg/dL (ref 8.9–10.3)
Chloride: 103 mmol/L (ref 98–111)
Creatinine, Ser: 0.64 mg/dL (ref 0.61–1.24)
GFR, Estimated: >60 mL/min (ref >60)
Glucose, Bld: 93 mg/dL (ref 70–99)
Potassium: 3.6 mmol/L (ref 3.5–5.1)
Sodium: 134 mmol/L — ABNORMAL LOW (ref 135–145)

## 2021-03-29 LAB — SURGICAL PCR SCREEN
MRSA, PCR: NEGATIVE
Staphylococcus aureus: NEGATIVE

## 2021-03-29 NOTE — Patient Instructions (Signed)
Your procedure is scheduled on: 04/12/21 Report to DAY SURGERY DEPARTMENT LOCATED ON 2ND FLOOR MEDICAL MALL ENTRANCE. To find out your arrival time please call [phone] between 1PM - 3PM on 04/09/21.  Remember: Instructions that are not followed completely may result in serious medical risk, up to and including death, or upon the discretion of your surgeon and anesthesiologist your surgery may need to be rescheduled.     _X__ 1. Do not eat food or drink any liquids after midnight the night before your procedure.                 No gum chewing or hard candies.   __X__2.  On the morning of surgery brush your teeth with toothpaste and water, you                 may rinse your mouth with mouthwash if you wish.  Do not swallow any              toothpaste of mouthwash.     _X__ 3.  No Alcohol for 24 hours before or after surgery.   _X__ 4.  Do Not Smoke or use e-cigarettes For 24 Hours Prior to Your Surgery.                 Do not use any chewable tobacco products for at least 6 hours prior to                 surgery.  ____  5.  Bring all medications with you on the day of surgery if instructed.   __X__  6.  Notify your doctor if there is any change in your medical condition      (cold, fever, infections).     Do not wear jewelry, make-up, hairpins, clips or nail polish. Do not wear lotions, powders, or perfumes.  Do not shave body hair 48 hours prior to surgery. Men may shave face and neck. Do not bring valuables to the hospital.    [facility] is not responsible for any belongings or valuables.  Contacts, dentures/partials or body piercings may not be worn into surgery. Bring a case for your contacts, glasses or hearing aids, a denture cup will be supplied. Leave your suitcase in the car. After surgery it may be brought to your room. For patients admitted to the hospital, discharge time is determined by your treatment team.   Patients discharged the day of surgery will not be  allowed to drive home.   Please read over the following fact sheets that you were given:   MRSA Information, CHG soap, Incentive spirometer  __X__ Take these medicines the morning of surgery with A SIP OF WATER:    1. famotidine (PEPCID) 20 MG table  2. divalproex (DEPAKOTE) 250 MG DR tablet  3.   4.  5.  6.  ____ Fleet Enema (as directed)   __X__ Use CHG Soap/SAGE wipes as directed  __X__ Use inhalers on the day of surgery  ____ Stop metformin/Janumet/Farxiga 2 days prior to surgery    ____ Take 1/2 of usual insulin dose the night before surgery. No insulin the morning          of surgery.   ____ Stop Blood Thinners Coumadin/Plavix/Xarelto/Pleta/Pradaxa/Eliquis/Effient/Aspirin  on   Or contact your Surgeon, Cardiologist or Medical Doctor regarding  ability to stop your blood thinners  __X__ Stop Anti-inflammatories 7 days before surgery such as Advil, Ibuprofen, Motrin,  BC or Goodies Powder, Naprosyn, Naproxen,  Aleve, Aspirin    __X__ Stop all herbals and supplements, fish oil or vitamin E until after surgery.    ____ Bring C-Pap to the hospital.

## 2021-04-08 ENCOUNTER — Other Ambulatory Visit: Payer: Self-pay

## 2021-04-08 ENCOUNTER — Other Ambulatory Visit
Admission: RE | Admit: 2021-04-08 | Discharge: 2021-04-08 | Disposition: A | Discharge status: Home / Self Care | Payer: Medicaid Other | Source: Ambulatory Visit | Attending: Orthopedic Surgery | Admitting: Orthopedic Surgery

## 2021-04-08 DIAGNOSIS — Z01812 Encounter for preprocedural laboratory examination: Secondary | ICD-10-CM | POA: Insufficient documentation

## 2021-04-08 DIAGNOSIS — Z20822 Contact with and (suspected) exposure to covid-19: Secondary | ICD-10-CM | POA: Insufficient documentation

## 2021-04-08 LAB — SARS CORONAVIRUS 2 (TAT 6-24 HRS): SARS Coronavirus 2: NEGATIVE

## 2021-04-09 ENCOUNTER — Other Ambulatory Visit: Payer: Medicaid Other

## 2021-04-12 ENCOUNTER — Encounter: Payer: Self-pay | Admitting: Orthopedic Surgery

## 2021-04-12 ENCOUNTER — Inpatient Hospital Stay
Admission: RE | Admit: 2021-04-12 | Discharge: 2021-04-13 | DRG: 470 | Disposition: A | Discharge status: Home / Self Care | Payer: Medicaid Other | Attending: Orthopedic Surgery | Admitting: Orthopedic Surgery

## 2021-04-12 ENCOUNTER — Inpatient Hospital Stay: Payer: Medicaid Other

## 2021-04-12 ENCOUNTER — Inpatient Hospital Stay: Payer: Medicaid Other | Admitting: Urgent Care

## 2021-04-12 ENCOUNTER — Other Ambulatory Visit: Payer: Self-pay

## 2021-04-12 ENCOUNTER — Encounter
Admission: RE | Disposition: A | Discharge status: Home / Self Care | Payer: Self-pay | Source: Home / Self Care | Attending: Orthopedic Surgery

## 2021-04-12 DIAGNOSIS — I10 Essential (primary) hypertension: Secondary | ICD-10-CM | POA: Diagnosis present

## 2021-04-12 DIAGNOSIS — Z8673 Personal history of transient ischemic attack (TIA), and cerebral infarction without residual deficits: Secondary | ICD-10-CM

## 2021-04-12 DIAGNOSIS — Z791 Long term (current) use of non-steroidal anti-inflammatories (NSAID): Secondary | ICD-10-CM

## 2021-04-12 DIAGNOSIS — K219 Gastro-esophageal reflux disease without esophagitis: Secondary | ICD-10-CM | POA: Diagnosis present

## 2021-04-12 DIAGNOSIS — M1732 Unilateral post-traumatic osteoarthritis, left knee: Secondary | ICD-10-CM | POA: Diagnosis present

## 2021-04-12 DIAGNOSIS — F209 Schizophrenia, unspecified: Secondary | ICD-10-CM | POA: Diagnosis present

## 2021-04-12 DIAGNOSIS — E7800 Pure hypercholesterolemia, unspecified: Secondary | ICD-10-CM | POA: Diagnosis present

## 2021-04-12 DIAGNOSIS — Z7982 Long term (current) use of aspirin: Secondary | ICD-10-CM | POA: Diagnosis not present

## 2021-04-12 DIAGNOSIS — M797 Fibromyalgia: Secondary | ICD-10-CM | POA: Diagnosis present

## 2021-04-12 DIAGNOSIS — Z7951 Long term (current) use of inhaled steroids: Secondary | ICD-10-CM

## 2021-04-12 DIAGNOSIS — J449 Chronic obstructive pulmonary disease, unspecified: Secondary | ICD-10-CM | POA: Diagnosis present

## 2021-04-12 DIAGNOSIS — F319 Bipolar disorder, unspecified: Secondary | ICD-10-CM | POA: Diagnosis present

## 2021-04-12 DIAGNOSIS — Z96652 Presence of left artificial knee joint: Secondary | ICD-10-CM

## 2021-04-12 DIAGNOSIS — Z79899 Other long term (current) drug therapy: Secondary | ICD-10-CM | POA: Diagnosis not present

## 2021-04-12 DIAGNOSIS — Z9889 Other specified postprocedural states: Secondary | ICD-10-CM

## 2021-04-12 HISTORY — DX: Other problems related to housing and economic circumstances: Z59.89

## 2021-04-12 HISTORY — DX: Alcohol abuse, in remission: F10.11

## 2021-04-12 HISTORY — DX: Homicidal ideations: R45.850

## 2021-04-12 HISTORY — PX: TOTAL KNEE ARTHROPLASTY: SHX125

## 2021-04-12 HISTORY — DX: Cannabis use, unspecified, uncomplicated: F12.90

## 2021-04-12 SURGERY — ARTHROPLASTY, KNEE, TOTAL
Anesthesia: Spinal | Site: Knee | Laterality: Left

## 2021-04-12 MED ORDER — LIDOCAINE HCL (CARDIAC) PF 100 MG/5ML IV SOSY
PREFILLED_SYRINGE | INTRAVENOUS | Status: DC | PRN
  Administered 2021-04-12: 60 mg via INTRAVENOUS

## 2021-04-12 MED ORDER — MIDAZOLAM HCL 2 MG/2ML IJ SOLN
INTRAMUSCULAR | Status: AC
  Filled 2021-04-12: qty 2

## 2021-04-12 MED ORDER — MIDAZOLAM HCL 2 MG/2ML IJ SOLN
INTRAMUSCULAR | Status: AC
Start: 2021-04-12 — End: ?
  Filled 2021-04-12: qty 2

## 2021-04-12 MED ORDER — CEFAZOLIN SODIUM-DEXTROSE 2-4 GM/100ML-% IV SOLN
INTRAVENOUS | Status: AC
  Administered 2021-04-12: 2 g via INTRAVENOUS
  Filled 2021-04-12: qty 100

## 2021-04-12 MED ORDER — BUPIVACAINE-EPINEPHRINE (PF) 0.5% -1:200000 IJ SOLN
INTRAMUSCULAR | Status: AC
  Filled 2021-04-12: qty 30

## 2021-04-12 MED ORDER — DOCUSATE SODIUM 100 MG PO CAPS
ORAL_CAPSULE | ORAL | Status: AC
  Administered 2021-04-12: 100 mg via ORAL
  Filled 2021-04-12: qty 1

## 2021-04-12 MED ORDER — LACTATED RINGERS IV SOLN: INTRAVENOUS | Status: DC

## 2021-04-12 MED ORDER — PHENOL 1.4 % MT LIQD
1.0000 | OROMUCOSAL | Status: DC | PRN
  Filled 2021-04-12: qty 177

## 2021-04-12 MED ORDER — NICOTINE 21 MG/24HR TD PT24
21.0000 mg | MEDICATED_PATCH | Freq: Every day | TRANSDERMAL | Status: DC
  Administered 2021-04-12 – 2021-04-13 (×2): 21 mg via TRANSDERMAL
  Filled 2021-04-12 (×3): qty 1

## 2021-04-12 MED ORDER — METHOCARBAMOL 500 MG PO TABS: 500.0000 mg | ORAL_TABLET | Freq: Four times a day (QID) | ORAL | Status: DC | PRN

## 2021-04-12 MED ORDER — CEFAZOLIN SODIUM-DEXTROSE 2-4 GM/100ML-% IV SOLN: 2.0000 g | Freq: Four times a day (QID) | INTRAVENOUS | Status: AC

## 2021-04-12 MED ORDER — LIDOCAINE HCL (PF) 2 % IJ SOLN
INTRAMUSCULAR | Status: AC
  Filled 2021-04-12: qty 5

## 2021-04-12 MED ORDER — PHENYLEPHRINE HCL-NACL 20-0.9 MG/250ML-% IV SOLN
INTRAVENOUS | Status: DC | PRN
  Administered 2021-04-12: 40 ug/min via INTRAVENOUS

## 2021-04-12 MED ORDER — FAMOTIDINE 20 MG PO TABS
20.0000 mg | ORAL_TABLET | Freq: Two times a day (BID) | ORAL | Status: DC
  Administered 2021-04-13: 20 mg via ORAL

## 2021-04-12 MED ORDER — MENTHOL 3 MG MT LOZG
1.0000 | LOZENGE | OROMUCOSAL | Status: DC | PRN
  Filled 2021-04-12: qty 9

## 2021-04-12 MED ORDER — OXYCODONE HCL 5 MG PO TABS
10.0000 mg | ORAL_TABLET | ORAL | Status: DC | PRN
  Administered 2021-04-12 – 2021-04-13 (×3): 10 mg via ORAL
  Administered 2021-04-13: 15 mg via ORAL

## 2021-04-12 MED ORDER — ALUM & MAG HYDROXIDE-SIMETH 200-200-20 MG/5ML PO SUSP: 30.0000 mL | ORAL | Status: DC | PRN

## 2021-04-12 MED ORDER — PROPOFOL 500 MG/50ML IV EMUL
INTRAVENOUS | Status: DC | PRN
  Administered 2021-04-12: 135 ug/kg/min via INTRAVENOUS

## 2021-04-12 MED ORDER — IPRATROPIUM-ALBUTEROL 0.5-2.5 (3) MG/3ML IN SOLN: 3.0000 mL | Freq: Four times a day (QID) | RESPIRATORY_TRACT | Status: DC | PRN

## 2021-04-12 MED ORDER — PRONTOSAN WOUND IRRIGATION OPTIME
TOPICAL | Status: DC | PRN
  Administered 2021-04-12: 1 via TOPICAL

## 2021-04-12 MED ORDER — ASPIRIN 81 MG PO CHEW
CHEWABLE_TABLET | ORAL | Status: AC
  Administered 2021-04-12: 81 mg via ORAL
  Filled 2021-04-12: qty 1

## 2021-04-12 MED ORDER — BUPIVACAINE-EPINEPHRINE (PF) 0.25% -1:200000 IJ SOLN
INTRAMUSCULAR | Status: AC
  Filled 2021-04-12: qty 30

## 2021-04-12 MED ORDER — ROPIVACAINE HCL 5 MG/ML IJ SOLN
INTRAMUSCULAR | Status: DC | PRN
  Administered 2021-04-12: 30 mL via EPIDURAL

## 2021-04-12 MED ORDER — BUPIVACAINE HCL (PF) 0.5 % IJ SOLN
INTRAMUSCULAR | Status: DC | PRN
  Administered 2021-04-12: 3 mL

## 2021-04-12 MED ORDER — FENTANYL CITRATE (PF) 100 MCG/2ML IJ SOLN: 25.0000 ug | INTRAMUSCULAR | Status: DC | PRN

## 2021-04-12 MED ORDER — STERILE WATER FOR IRRIGATION IR SOLN
Status: DC | PRN
  Administered 2021-04-12: 1000 mL

## 2021-04-12 MED ORDER — FENTANYL CITRATE (PF) 100 MCG/2ML IJ SOLN
INTRAMUSCULAR | Status: DC | PRN
  Administered 2021-04-12 (×2): 50 ug via INTRAVENOUS

## 2021-04-12 MED ORDER — SODIUM CHLORIDE FLUSH 0.9 % IV SOLN
INTRAVENOUS | Status: AC
  Filled 2021-04-12: qty 40

## 2021-04-12 MED ORDER — KETOROLAC TROMETHAMINE 30 MG/ML IJ SOLN
INTRAMUSCULAR | Status: DC | PRN
  Administered 2021-04-12: 30 mg via INTRAVENOUS

## 2021-04-12 MED ORDER — ALBUTEROL SULFATE (2.5 MG/3ML) 0.083% IN NEBU: 3.0000 mL | INHALATION_SOLUTION | Freq: Four times a day (QID) | RESPIRATORY_TRACT | Status: DC | PRN

## 2021-04-12 MED ORDER — PROPOFOL 10 MG/ML IV BOLUS
INTRAVENOUS | Status: DC | PRN
  Administered 2021-04-12: 40 mg via INTRAVENOUS

## 2021-04-12 MED ORDER — CHLORHEXIDINE GLUCONATE 0.12 % MT SOLN: 15.0000 mL | Freq: Once | OROMUCOSAL | Status: AC

## 2021-04-12 MED ORDER — CEFAZOLIN SODIUM-DEXTROSE 2-3 GM-%(50ML) IV SOLR
INTRAVENOUS | Status: DC | PRN
  Administered 2021-04-12: 2 g via INTRAVENOUS

## 2021-04-12 MED ORDER — ROPIVACAINE HCL 5 MG/ML IJ SOLN
INTRAMUSCULAR | Status: AC
  Filled 2021-04-12: qty 30

## 2021-04-12 MED ORDER — FENTANYL CITRATE (PF) 100 MCG/2ML IJ SOLN
INTRAMUSCULAR | Status: AC
  Filled 2021-04-12: qty 2

## 2021-04-12 MED ORDER — OXYCODONE HCL 5 MG PO TABS
ORAL_TABLET | ORAL | Status: AC
  Filled 2021-04-12: qty 2

## 2021-04-12 MED ORDER — DIPHENHYDRAMINE HCL 12.5 MG/5ML PO ELIX: 12.5000 mg | ORAL_SOLUTION | ORAL | Status: DC | PRN

## 2021-04-12 MED ORDER — DIVALPROEX SODIUM 250 MG PO DR TAB
250.0000 mg | DELAYED_RELEASE_TABLET | Freq: Three times a day (TID) | ORAL | Status: DC
  Administered 2021-04-12 – 2021-04-13 (×3): 250 mg via ORAL
  Filled 2021-04-12 (×7): qty 1

## 2021-04-12 MED ORDER — PROPOFOL 1000 MG/100ML IV EMUL
INTRAVENOUS | Status: AC
  Filled 2021-04-12: qty 100

## 2021-04-12 MED ORDER — POVIDONE-IODINE 10 % EX SWAB
2.0000 | Freq: Once | CUTANEOUS | Status: DC
Start: 2021-04-12 — End: 2021-04-12

## 2021-04-12 MED ORDER — HYDROMORPHONE HCL 1 MG/ML IJ SOLN: 0.5000 mg | INTRAMUSCULAR | Status: DC | PRN

## 2021-04-12 MED ORDER — KETOROLAC TROMETHAMINE 15 MG/ML IJ SOLN
INTRAMUSCULAR | Status: AC
  Administered 2021-04-12: 15 mg via INTRAVENOUS
  Filled 2021-04-12: qty 1

## 2021-04-12 MED ORDER — KETOROLAC TROMETHAMINE 30 MG/ML IJ SOLN
INTRAMUSCULAR | Status: AC
  Filled 2021-04-12: qty 1

## 2021-04-12 MED ORDER — PHENYLEPHRINE HCL (PRESSORS) 10 MG/ML IV SOLN
INTRAVENOUS | Status: DC | PRN
  Administered 2021-04-12: 80 ug via INTRAVENOUS

## 2021-04-12 MED ORDER — FAMOTIDINE 20 MG PO TABS
ORAL_TABLET | ORAL | Status: AC
  Administered 2021-04-12: 20 mg via ORAL
  Filled 2021-04-12: qty 1

## 2021-04-12 MED ORDER — LISINOPRIL 10 MG PO TABS
10.0000 mg | ORAL_TABLET | Freq: Every day | ORAL | Status: DC
  Administered 2021-04-13: 10 mg via ORAL
  Filled 2021-04-12 (×2): qty 1

## 2021-04-12 MED ORDER — DEXMEDETOMIDINE (PRECEDEX) IN NS 20 MCG/5ML (4 MCG/ML) IV SYRINGE
PREFILLED_SYRINGE | INTRAVENOUS | Status: DC | PRN
  Administered 2021-04-12: 4 ug via INTRAVENOUS

## 2021-04-12 MED ORDER — METHOCARBAMOL 500 MG PO TABS
500.0000 mg | ORAL_TABLET | Freq: Four times a day (QID) | ORAL | Status: DC | PRN
  Administered 2021-04-12 – 2021-04-13 (×2): 500 mg via ORAL

## 2021-04-12 MED ORDER — CHLORHEXIDINE GLUCONATE 0.12 % MT SOLN
OROMUCOSAL | Status: AC
  Administered 2021-04-12: 15 mL
  Filled 2021-04-12: qty 15

## 2021-04-12 MED ORDER — MOMETASONE FURO-FORMOTEROL FUM 200-5 MCG/ACT IN AERO
2.0000 | INHALATION_SPRAY | Freq: Two times a day (BID) | RESPIRATORY_TRACT | Status: DC
Start: 2021-04-13 — End: 2021-04-13
  Filled 2021-04-12: qty 8.8

## 2021-04-12 MED ORDER — 0.9 % SODIUM CHLORIDE (POUR BTL) OPTIME
TOPICAL | Status: DC | PRN
Start: 2021-04-12 — End: 2021-04-12
  Administered 2021-04-12: 500 mL

## 2021-04-12 MED ORDER — TRANEXAMIC ACID-NACL 1000-0.7 MG/100ML-% IV SOLN
1000.0000 mg | INTRAVENOUS | Status: AC
  Administered 2021-04-12: 1000 mg via INTRAVENOUS

## 2021-04-12 MED ORDER — CEFAZOLIN SODIUM-DEXTROSE 2-4 GM/100ML-% IV SOLN
INTRAVENOUS | Status: AC
  Filled 2021-04-12: qty 100

## 2021-04-12 MED ORDER — ONDANSETRON HCL 4 MG/2ML IJ SOLN
INTRAMUSCULAR | Status: AC
  Filled 2021-04-12: qty 2

## 2021-04-12 MED ORDER — METOCLOPRAMIDE HCL 5 MG/ML IJ SOLN: 5.0000 mg | Freq: Three times a day (TID) | INTRAMUSCULAR | Status: DC | PRN

## 2021-04-12 MED ORDER — METOCLOPRAMIDE HCL 10 MG PO TABS: 5.0000 mg | ORAL_TABLET | Freq: Three times a day (TID) | ORAL | Status: DC | PRN

## 2021-04-12 MED ORDER — MIDAZOLAM HCL 5 MG/5ML IJ SOLN
INTRAMUSCULAR | Status: DC | PRN
  Administered 2021-04-12: 2 mg via INTRAVENOUS
  Administered 2021-04-12: 1 mg via INTRAVENOUS

## 2021-04-12 MED ORDER — PHENYLEPHRINE HCL-NACL 20-0.9 MG/250ML-% IV SOLN
INTRAVENOUS | Status: AC
  Filled 2021-04-12: qty 250

## 2021-04-12 MED ORDER — OXYCODONE HCL 5 MG PO TABS
ORAL_TABLET | ORAL | Status: AC
Start: 2021-04-12 — End: 2021-04-13
  Filled 2021-04-12: qty 2

## 2021-04-12 MED ORDER — METHOCARBAMOL 500 MG PO TABS
ORAL_TABLET | ORAL | Status: AC
Start: 2021-04-12 — End: 2021-04-13
  Filled 2021-04-12: qty 1

## 2021-04-12 MED ORDER — METHOCARBAMOL 1000 MG/10ML IJ SOLN
500.0000 mg | Freq: Four times a day (QID) | INTRAVENOUS | Status: DC | PRN
  Filled 2021-04-12: qty 5

## 2021-04-12 MED ORDER — OXYCODONE HCL 5 MG PO TABS: 5.0000 mg | ORAL_TABLET | ORAL | Status: DC | PRN

## 2021-04-12 MED ORDER — ONDANSETRON HCL 4 MG/2ML IJ SOLN
4.0000 mg | Freq: Four times a day (QID) | INTRAMUSCULAR | Status: DC | PRN
  Administered 2021-04-13: 4 mg via INTRAVENOUS

## 2021-04-12 MED ORDER — DEXMEDETOMIDINE HCL IN NACL 200 MCG/50ML IV SOLN
INTRAVENOUS | Status: AC
  Filled 2021-04-12: qty 50

## 2021-04-12 MED ORDER — OXYCODONE HCL 5 MG PO TABS
ORAL_TABLET | ORAL | Status: AC
  Administered 2021-04-12: 10 mg via ORAL
  Filled 2021-04-12: qty 2

## 2021-04-12 MED ORDER — SODIUM CHLORIDE 0.9 % IR SOLN
Status: DC | PRN
Start: 2021-04-12 — End: 2021-04-12
  Administered 2021-04-12: 3000 mL

## 2021-04-12 MED ORDER — ONDANSETRON HCL 4 MG/2ML IJ SOLN: 4.0000 mg | Freq: Once | INTRAMUSCULAR | Status: DC | PRN

## 2021-04-12 MED ORDER — ASPIRIN 81 MG PO CHEW
81.0000 mg | CHEWABLE_TABLET | Freq: Two times a day (BID) | ORAL | Status: DC
  Administered 2021-04-13: 81 mg via ORAL

## 2021-04-12 MED ORDER — BUPIVACAINE LIPOSOME 1.3 % IJ SUSP
INTRAMUSCULAR | Status: AC
  Filled 2021-04-12: qty 20

## 2021-04-12 MED ORDER — KETOROLAC TROMETHAMINE 15 MG/ML IJ SOLN: 15.0000 mg | Freq: Four times a day (QID) | INTRAMUSCULAR | Status: DC

## 2021-04-12 MED ORDER — PROPOFOL 10 MG/ML IV BOLUS
INTRAVENOUS | Status: AC
  Filled 2021-04-12: qty 20

## 2021-04-12 MED ORDER — ARIPIPRAZOLE 10 MG PO TABS
10.0000 mg | ORAL_TABLET | Freq: Every day | ORAL | Status: DC
  Administered 2021-04-13: 10 mg via ORAL
  Filled 2021-04-12 (×2): qty 1

## 2021-04-12 MED ORDER — ONDANSETRON HCL 4 MG/2ML IJ SOLN
INTRAMUSCULAR | Status: DC | PRN
  Administered 2021-04-12: 4 mg via INTRAVENOUS

## 2021-04-12 MED ORDER — ATORVASTATIN CALCIUM 20 MG PO TABS
20.0000 mg | ORAL_TABLET | Freq: Every evening | ORAL | Status: DC
  Administered 2021-04-12: 20 mg via ORAL
  Filled 2021-04-12 (×2): qty 1

## 2021-04-12 MED ORDER — DOCUSATE SODIUM 100 MG PO CAPS
100.0000 mg | ORAL_CAPSULE | Freq: Two times a day (BID) | ORAL | Status: DC
  Administered 2021-04-13: 100 mg via ORAL

## 2021-04-12 MED ORDER — ORAL CARE MOUTH RINSE: 15.0000 mL | Freq: Once | OROMUCOSAL | Status: AC

## 2021-04-12 MED ORDER — CEFAZOLIN SODIUM-DEXTROSE 2-4 GM/100ML-% IV SOLN: 2.0000 g | INTRAVENOUS | Status: DC

## 2021-04-12 MED ORDER — TRANEXAMIC ACID-NACL 1000-0.7 MG/100ML-% IV SOLN
INTRAVENOUS | Status: AC
  Filled 2021-04-12: qty 100

## 2021-04-12 MED ORDER — ONDANSETRON HCL 4 MG PO TABS: 4.0000 mg | ORAL_TABLET | Freq: Four times a day (QID) | ORAL | Status: DC | PRN

## 2021-04-12 MED ORDER — BISACODYL 10 MG RE SUPP
10.0000 mg | Freq: Every day | RECTAL | Status: DC | PRN
  Filled 2021-04-12: qty 1

## 2021-04-12 MED ORDER — SODIUM CHLORIDE (PF) 0.9 % IJ SOLN
INTRAMUSCULAR | Status: DC | PRN
  Administered 2021-04-12: 90 mL

## 2021-04-12 SURGICAL SUPPLY — 58 items
BASEPLATE TIBIAL LT SZ6 (Joint) ×1 IMPLANT
BLADE SAGITTAL AGGR TOOTH XLG (BLADE) ×2 IMPLANT
BLADE SAW SAG 25X90X1.19 (BLADE) ×2 IMPLANT
BOWL CEMENT MIX W/ADAPTER (MISCELLANEOUS) ×2 IMPLANT
BRUSH SCRUB EZ  4% CHG (MISCELLANEOUS) ×2
BRUSH SCRUB EZ 4% CHG (MISCELLANEOUS) ×1 IMPLANT
CEMENT BONE 40GM (Cement) ×4 IMPLANT
CHLORAPREP W/TINT 26 (MISCELLANEOUS) ×4 IMPLANT
COMP FEMORAL OXINUM SZ7 LEFT (Knees) ×2 IMPLANT
COMP PATELLA GENESIS 35MM (Stem) ×2 IMPLANT
COMPONENT FEMRL OXINUM SZ7 LT (Knees) IMPLANT
COMPONENT PATELLA GENESIS 35MM (Stem) IMPLANT
COOLER POLAR GLACIER W/PUMP (MISCELLANEOUS) ×2 IMPLANT
CUFF TOURN SGL QUICK 34 (TOURNIQUET CUFF)
CUFF TRNQT CYL 34X4.125X (TOURNIQUET CUFF) ×1 IMPLANT
DRAPE 3/4 80X56 (DRAPES) ×4 IMPLANT
DRAPE INCISE IOBAN 66X60 STRL (DRAPES) ×2 IMPLANT
DRSG OPSITE POSTOP 4X14 (GAUZE/BANDAGES/DRESSINGS) ×1 IMPLANT
ELECT REM PT RETURN 9FT ADLT (ELECTROSURGICAL) ×2
ELECTRODE REM PT RTRN 9FT ADLT (ELECTROSURGICAL) ×1 IMPLANT
GAUZE SPONGE 4X4 12PLY STRL (GAUZE/BANDAGES/DRESSINGS) ×2 IMPLANT
GAUZE XEROFORM 1X8 LF (GAUZE/BANDAGES/DRESSINGS) ×2 IMPLANT
GLOVE SURG ENC MOIS LTX SZ8 (GLOVE) ×2 IMPLANT
GLOVE SURG ORTHO LTX SZ8.5 (GLOVE) ×6 IMPLANT
GLOVE SURG UNDER POLY LF SZ8.5 (GLOVE) ×4 IMPLANT
GOWN STRL REUS W/ TWL LRG LVL3 (GOWN DISPOSABLE) ×1 IMPLANT
GOWN STRL REUS W/ TWL XL LVL3 (GOWN DISPOSABLE) ×1 IMPLANT
GOWN STRL REUS W/TWL LRG LVL3 (GOWN DISPOSABLE) ×2
GOWN STRL REUS W/TWL XL LVL3 (GOWN DISPOSABLE) ×2
INSERT TIB XLPE 9 SZ 5-6 (Insert) ×1 IMPLANT
IV NS IRRIG 3000ML ARTHROMATIC (IV SOLUTION) ×2 IMPLANT
KIT TURNOVER KIT A (KITS) ×2 IMPLANT
MANIFOLD NEPTUNE II (INSTRUMENTS) ×2 IMPLANT
MAT ABSORB  FLUID 56X50 GRAY (MISCELLANEOUS) ×2
MAT ABSORB FLUID 56X50 GRAY (MISCELLANEOUS) ×1 IMPLANT
NDL SAFETY ECLIPSE 18X1.5 (NEEDLE) ×1 IMPLANT
NDL SPNL 20GX3.5 QUINCKE YW (NEEDLE) ×1 IMPLANT
NEEDLE HYPO 18GX1.5 SHARP (NEEDLE) ×2
NEEDLE SPNL 20GX3.5 QUINCKE YW (NEEDLE) ×2 IMPLANT
NS IRRIG 1000ML POUR BTL (IV SOLUTION) ×2 IMPLANT
PACK TOTAL KNEE (MISCELLANEOUS) ×2 IMPLANT
PAD DE MAYO PRESSURE PROTECT (MISCELLANEOUS) ×2 IMPLANT
PAD WRAPON POLAR KNEE (MISCELLANEOUS) ×1 IMPLANT
PULSAVAC PLUS IRRIG FAN TIP (DISPOSABLE) ×2
SOLUTION PRONTOSAN WOUND 350ML (IRRIGATION / IRRIGATOR) ×2 IMPLANT
STAPLER SKIN PROX 35W (STAPLE) ×2 IMPLANT
SUCTION FRAZIER HANDLE 10FR (MISCELLANEOUS) ×2
SUCTION TUBE FRAZIER 10FR DISP (MISCELLANEOUS) ×1 IMPLANT
SUT DVC 2 QUILL PDO  T11 36X36 (SUTURE) ×2
SUT DVC 2 QUILL PDO T11 36X36 (SUTURE) ×1 IMPLANT
SUT VIC AB 2-0 CT1 18 (SUTURE) ×2 IMPLANT
SUT VIC AB 2-0 CT1 27 (SUTURE)
SUT VIC AB 2-0 CT1 TAPERPNT 27 (SUTURE) IMPLANT
SUT VIC AB PLUS 45CM 1-MO-4 (SUTURE) ×2 IMPLANT
SYR 30ML LL (SYRINGE) ×6 IMPLANT
TIP FAN IRRIG PULSAVAC PLUS (DISPOSABLE) ×1 IMPLANT
WATER STERILE IRR 500ML POUR (IV SOLUTION) ×2 IMPLANT
WRAPON POLAR PAD KNEE (MISCELLANEOUS) ×2

## 2021-04-12 NOTE — H&P (Signed)
The patient has been re-examined, and the chart reviewed, and there have been no interval changes to the documented history and physical.  Plan a left total knee today.  Anesthesia is consulted regarding a peripheral nerve block for post-operative pain.  The risks, benefits, and alternatives have been discussed at length, and the patient is willing to proceed.     

## 2021-04-12 NOTE — Anesthesia Procedure Notes (Signed)
Anesthesia Regional Block: Adductor canal block   Pre-Anesthetic Checklist: , timeout performed,  Correct Patient, Correct Site, Correct Laterality,  Correct Procedure, Correct Position, site marked,  Risks and benefits discussed,  Surgical consent,  Pre-op evaluation,  At surgeon's request and post-op pain management  Laterality: Lower and Left  Prep: chloraprep       Needles:  Injection technique: Single-shot  Needle Type: Echogenic Needle     Needle Length: 9cm  Needle Gauge: 21     Additional Needles:   Procedures:,,,, ultrasound used (permanent image in chart),,    Narrative:  Start time: 04/12/2021 10:56 AM End time: 04/12/2021 11:01 AM Injection made incrementally with aspirations every 5 mL.  Performed by: Personally  Anesthesiologist: Lenard Simmer, MD  Additional Notes: Patient consented for risk and benefits of nerve block including but not limited to nerve damage, failed block, bleeding and infection.  Patient voiced understanding.  Functioning IV was confirmed and monitors were applied.  Timeout done prior to procedure and prior to any sedation being given to the patient.  Patient confirmed procedure site prior to any sedation given to the patient.  A 70mm 22ga Stimuplex needle was used. Sterile prep,hand hygiene and sterile gloves were used.  Minimal sedation used for procedure.  No paresthesia endorsed by patient during the procedure.  Negative aspiration and negative test dose prior to incremental administration of local anesthetic. The patient tolerated the procedure well with no immediate complications.

## 2021-04-12 NOTE — Evaluation (Signed)
Physical Therapy Evaluation Patient Details Name: Scott Howell MRN: [id number] DOB: 16-Jul-1970 Today's Date: 04/12/2021  History of Present Illness  Patient is a 51 year old male s/p L TKA. PMH includes: seizure, bipolar d/o, schizophrenia, substance abuse, HTN, HLD, CVA, GERD.  Clinical Impression  Patient received in bed, he is agreeable to PT session. Reports pain is beginning, but mild at this time. He reports he has sensation to touch in B LEs, performed AP and was able to slide legs in bed. Performed supine to sit with mod independence. He required min assist to stand from elevated bed. On attempting to take steps with RW and +2 min A patient is very wobbly and falling to side, decreased coordination noted of B LEs, therefore patient returned to sitting on side of bed. He wanted to sit up so assisted him to transfer to recliner with +2 min assist. Patient will continue to benefit from skilled PT while here to improve functional mobility, strength and safety for return home at discharge.          Recommendations for follow up therapy are one component of a multi-disciplinary discharge planning process, led by the attending physician.  Recommendations may be updated based on patient status, additional functional criteria and insurance authorization.  Follow Up Recommendations Home health PT    Assistance Recommended at Discharge Frequent or constant Supervision/Assistance  Patient can return home with the following  A little help with walking and/or transfers;A little help with bathing/dressing/bathroom;Assistance with cooking/housework;Assist for transportation;Help with stairs or ramp for entrance    Equipment Recommendations Rolling walker (2 wheels)  Recommendations for Other Services       Functional Status Assessment Patient has had a recent decline in their functional status and demonstrates the ability to make significant improvements in function in a reasonable and  predictable amount of time.     Precautions / Restrictions Precautions Precautions: Fall Restrictions Weight Bearing Restrictions: Yes LLE Weight Bearing: Weight bearing as tolerated      Mobility  Bed Mobility Overal bed mobility: Modified Independent                  Transfers Overall transfer level: Needs assistance Equipment used: Rolling walker (2 wheels) Transfers: Sit to/from Stand, Bed to chair/wheelchair/BSC Sit to Stand: Min assist, From elevated surface Stand pivot transfers: Min assist, +2 physical assistance, From elevated surface         General transfer comment: pivoted from bed to recliner to eat lunch    Ambulation/Gait Ambulation/Gait assistance: Mod assist, +2 physical assistance, +2 safety/equipment Gait Distance (Feet): 2 Feet Assistive device: Rolling walker (2 wheels) Gait Pattern/deviations: Step-to pattern, Decreased step length - right, Decreased step length - left, Decreased weight shift to left, Decreased weight shift to right, Staggering left, Staggering right, Knees buckling Gait velocity: decr     General Gait Details: patient unable to ambulate due to decreased sensation in LEs with standing and attempting to ambulate. Unsafe at this time.  Stairs            Wheelchair Mobility    Modified Rankin (Stroke Patients Only)       Balance Overall balance assessment: Needs assistance Sitting-balance support: Feet supported Sitting balance-Leahy Scale: Good     Standing balance support: Bilateral upper extremity supported, During functional activity, Reliant on assistive device for balance Standing balance-Leahy Scale: Zero Standing balance comment: requires +2 assist and walker for safety with mobility at this time due to LE numbness/weakness  Pertinent Vitals/Pain Pain Assessment Pain Assessment: 0-10 Pain Score: 4  Pain Location: L knee Pain Descriptors / Indicators:  Discomfort Pain Intervention(s): Monitored during session, Repositioned, Ice applied    Home Living Family/patient expects to be discharged to:: Private residence Living Arrangements: Alone Available Help at Discharge: Family;Available PRN/intermittently Type of Home: Other(Comment) (camper) Home Access: Stairs to enter Entrance Stairs-Rails: Right Entrance Stairs-Number of Steps: 1   Home Layout: One level Home Equipment: Cane - single point;Rolling Walker (2 wheels) Additional Comments: Pt reports that he lives on the property of his mother's house in a camper. He reports that he has to go inside her house to shower and have BMs. Pt reports that the family could help intermittently if he needed it. Was using SPC prior to surgery, has RW at home but states it is old and he needs a new one.    Prior Function Prior Level of Function : Independent/Modified Independent             Mobility Comments: using cane ADLs Comments: independent     Hand Dominance   Dominant Hand: Right    Extremity/Trunk Assessment   Upper Extremity Assessment Upper Extremity Assessment: Defer to OT evaluation    Lower Extremity Assessment Lower Extremity Assessment: Generalized weakness;LLE deficits/detail LLE Deficits / Details: patient reports he has sensation in L LE, however when tried standing and taking steps legs were wobbly. LLE Coordination: decreased gross motor    Cervical / Trunk Assessment Cervical / Trunk Assessment: Normal  Communication   Communication: No difficulties  Cognition Arousal/Alertness: Awake/alert Behavior During Therapy: WFL for tasks assessed/performed, Restless Overall Cognitive Status: Within Functional Limits for tasks assessed                                 General Comments: patient cooperative, but lacking insight/safety awareness        General Comments      Exercises     Assessment/Plan    PT Assessment Patient needs continued  PT services  PT Problem List Decreased strength;Decreased mobility;Decreased safety awareness;Decreased activity tolerance;Decreased balance;Pain;Decreased knowledge of use of DME;Decreased cognition       PT Treatment Interventions DME instruction;Therapeutic exercise;Gait training;Balance training;Stair training;Neuromuscular re-education;Functional mobility training;Therapeutic activities;Patient/family education    PT Goals (Current goals can be found in the Care Plan section)  Acute Rehab PT Goals Patient Stated Goal: return home PT Goal Formulation: With patient Time For Goal Achievement: 04/19/21 Potential to Achieve Goals: Good    Frequency BID     Co-evaluation               AM-PAC PT "6 Clicks" Mobility  Outcome Measure Help needed turning from your back to your side while in a flat bed without using bedrails?: None Help needed moving from lying on your back to sitting on the side of a flat bed without using bedrails?: None Help needed moving to and from a bed to a chair (including a wheelchair)?: A Little Help needed standing up from a chair using your arms (e.g., wheelchair or bedside chair)?: A Little Help needed to walk in hospital room?: Total Help needed climbing 3-5 steps with a railing? : Total 6 Click Score: 16    End of Session Equipment Utilized During Treatment: Gait belt Activity Tolerance: Other (comment) (limited by continues weakness in LEs) Patient left: in chair;with call bell/phone within reach Nurse Communication: Mobility status PT Visit Diagnosis: Unsteadiness  on feet (R26.81);Other abnormalities of gait and mobility (R26.89);Muscle weakness (generalized) (M62.81);Difficulty in walking, not elsewhere classified (R26.2);Pain Pain - Right/Left: Left Pain - part of body: Knee    Time: [id number] PT Time Calculation (min) (ACUTE ONLY): 17 min   Charges:   PT Evaluation $PT Eval Moderate Complexity: 1 Mod           , PT,  [facility] 04/12/21,2:34 PM

## 2021-04-12 NOTE — Transfer of Care (Signed)
Immediate Anesthesia Transfer of Care Note  Patient: Scott Howell  Procedure(s) Performed: TOTAL KNEE ARTHROPLASTY (Left: Knee)  Patient Location: PACU  Anesthesia Type:Spinal and GA combined with regional for post-op pain  Level of Consciousness: awake  Airway & Oxygen Therapy: Patient Spontanous Breathing and Patient connected to face mask oxygen  Post-op Assessment: Report given to RN and Post -op Vital signs reviewed and stable  Post vital signs: Reviewed and stable  Last Vitals:  Vitals Value Taken Time  BP 107/70 04/12/21 0951  Temp    Pulse 71 04/12/21 0956  Resp 28 04/12/21 0956  SpO2 99 % 04/12/21 0956  Vitals shown include unvalidated device data.  Last Pain:  Vitals:   04/12/21 0620  TempSrc: Tympanic  PainSc: 8          Complications: No notable events documented.

## 2021-04-12 NOTE — TOC Progression Note (Signed)
Transition of Care [facility]) - Progression Note    Patient Details  Name: Scott Howell MRN: [id number] Date of Birth: 25-Mar-1970  Transition of Care [facility]) CM/SW [place], RN Phone Number: 04/12/2021, 3:42 PM  Clinical Narrative:   Met with the patient to discuss DC planning and needs, he lives [place] his parents land in a camper, His parents will provide Transportation home, he uses Medicaid transport to go to the doctor, he will do Outpatient PT, He needs a RW and a 3in 1, [employer] will deliver to the bedside prior to DC         Expected Discharge Plan and Services                                                 Social Determinants of Health (SDOH) Interventions    Readmission Risk Interventions No flowsheet data found.

## 2021-04-12 NOTE — Op Note (Signed)
DATE OF SURGERY:  04/12/2021 TIME: 9:55 AM  PATIENT NAME:  Scott Howell   AGE: 51 y.o.    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   PREOPERATIVE INDICATIONS:  Scott Howell is an 51 y.o. 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  Radiographs have demonstrated tricompartmental osteoarthritis joint space narrowing, osteophytes, subchondral sclerosis and cyst formation.  The risks, benefits, and alternatives were discussed at length including but not limited to the risks of infection, bleeding, nerve or blood vessel injury, knee stiffness, fracture, dislocation, loosening or failure of the hardware and the need for further surgery. Medical risks include but not limited to DVT and pulmonary embolism, myocardial infarction, stroke, pneumonia, respiratory failure and death. I discussed these risks with the patient in my office prior to the date of surgery. They understood these risks and were willing to proceed.  OPERATIVE FINDINGS AND UNIQUE ASPECTS OF THE CASE:  All three compartments with advanced and severe degenerative changes, large osteophytes and an abundance of synovial fluid. Significant deformity was also noted. A decision was made to proceed with total knee arthroplasty.   OPERATIVE DESCRIPTION:  The patient was  brought to the operative room and placed in a supine position after undergoing placement of a general anesthetic. IV antibiotics were given. Patient received tranexamic acid. The lower extremity was prepped and draped in the usual sterile fashion.  A time out was performed to verify the patient's name, date of birth, medical record number, correct site of surgery and correct procedure to be performed. The timeout was also used to confirm the patient received antibiotics and that appropriate instruments, implants and radiographs studies were available in the room.  The leg was elevated and exsanguinated with an Esmarch and the tourniquet was inflated to 250 mmHg.  A midline incision was made over the left knee.. A medial parapatellar arthrotomy was then made and the patella subluxed laterally and the knee was brought into 90 of flexion. Hoffa's fat pad along with the anterior cruciate ligament was resected and the medial joint line was exposed.  Attention was then turned to preparation of the patella. The thickness of the patella was measured with a caliper, the diameter measured with the patella templates.  The patella resection was then made with an oscillating saw using the patella cutting guide.  The 35 mm button fit appropriately.  3 peg holes for the patella component were then drilled.  The extramedullary tibial cutting guide was then placed using the anterior tibial crest and second ray of the foot as a reference.  The tibial cutting guide was adjusted to allow for appropriate posterior slope.  The tibial cutting block was pinned into position. The slotted stylus was used to measure the proximal tibial resection of 9 mm off the high lateral side. Care was taken during the tibial resection to protect the medial and collateral ligaments.  The resected tibial bone was removed.  The distal femur was resected using the intramedullary cutting guide.  Care was taken to protect the collateral ligaments  during distal femoral resection.  The distal femoral resection was performed with an oscillating saw. The femoral cutting guide was then removed. Extension gap was measured with a 9 mm spacer block and alignment and extension was confirmed using a long alignment rod. The femur was sized to be a 7. Rotation of the referencing guide was checked with the epicondylar axis and Whitesides line. Then the 4-in-1 cutting jig was then applied to the distal femur. A stylus was used to confirm that the anterior femur would not be notched.   Then the anterior, posterior and chamfer femoral cuts were then made with an oscillating saw.  The knee was distracted and all posterior osteophytes were removed.  The flexion gap was then measured with a flexion spacer block and long alignment rod and was found to be symmetric with the extension gap and perpendicular to mechanical axis of the tibia.  The proximal tibia plateau was then sized with trial trays. The best coverage was achieved with a size 6. This tibial tray was then pinned into position. The proximal tibia was then prepared with the keel punch.  After tibial preparation was completed, all trial components were inserted with polyethylene trials. The knee achieved full extension and flexed to 120 degrees. Ligament were stable to varus and valgus at full extension as well as 30, 60 and 90 degrees of flexion.   The trials were then placed. Knee was taken through a full range of motion and deemed to be stable with the trial components. All trial components were then removed.  The joint was copiously irrigated with pulse lavage.  The final total knee arthroplasty components were then cemented into place. The knee was held in extension while cement was allowed to cure.The knee was taken through a range of motion and the patella tracked well and the knee was again irrigated copiously.  The knee capsule was then injected with Exparel.  The medial arthrotomy was closed with #1  Vicryl and #2 Quill. The subcutaneous tissue closed with  2-0 vicryl, and skin approximated with staples.  A dry sterile and compressive dressing was applied.  A Polar Care was applied to the operative knee.  The patient was awakened and brought to the PACU in stable and satisfactory condition.  All sharp, lap and instrument counts were correct at the conclusion the case. I spoke with the patient's family in the postop consultation room to let them know the case had been performed without complication and the patient was stable in recovery room.   Total tourniquet time was 54 minutes.

## 2021-04-12 NOTE — Anesthesia Procedure Notes (Addendum)
Spinal  Patient location during procedure: OR Start time: 04/12/2021 7:38 AM End time: 04/12/2021 8:06 AM Reason for block: surgical anesthesia Staffing Performed: anesthesiologist and resident/CRNA  Anesthesiologist: Martha Clan, MD Resident/CRNA: Loletha Grayer, CRNA Preanesthetic Checklist Completed: patient identified, IV checked, site marked, risks and benefits discussed, surgical consent, monitors and equipment checked, pre-op evaluation and timeout performed Spinal Block Patient position: sitting Prep: ChloraPrep Patient monitoring: heart rate, continuous pulse ox, blood pressure and cardiac monitor Approach: midline Location: L3-4 Injection technique: single-shot Needle Needle type: Whitacre and Introducer  Needle gauge: 25 G Needle length: 12.7 cm Assessment Events: CSF return Additional Notes T.  CRNA x2 attempts, L3-4, L4-5-with 24g pencan, 4 inch. No success, LOR no CSF noted. Dr. Rosey Bath to bedside x2 attempts, same site, 24g Pencan 4 inch, no success. 25 G, 5 inch Whitacre with success at L3-4. Negative paresthesia. Negative blood return. Positive free-flowing CSF. Expiration date of kit checked and confirmed. Patient tolerated procedure well, without complications.

## 2021-04-12 NOTE — Anesthesia Preprocedure Evaluation (Signed)
Anesthesia Evaluation  Patient identified by MRN, date of birth, ID band Patient awake    Reviewed: Allergy & Precautions, NPO status , Patient's Chart, lab work & pertinent test results  History of Anesthesia Complications Negative for: history of anesthetic complications  Airway Mallampati: III  TM Distance: >3 FB Neck ROM: Limited    Dental  (+) Edentulous Upper, Edentulous Lower   Pulmonary neg shortness of breath, asthma , neg sleep apnea, COPD,  COPD inhaler, neg recent URI, Current Smoker and Patient abstained from smoking.,    Pulmonary exam normal breath sounds clear to auscultation       Cardiovascular Exercise Tolerance: Good METShypertension, (-) angina(-) CAD and (-) Past MI (-) dysrhythmias  Rhythm:Regular Rate:Normal - Systolic murmurs    Neuro/Psych  Headaches, Seizures -,  PSYCHIATRIC DISORDERS Bipolar Disorder Schizophrenia Patient denies stroke hx  Neuromuscular disease    GI/Hepatic GERD  ,(+)     substance abuse  marijuana use,   Endo/Other  neg diabetes  Renal/GU negative Renal ROS     Musculoskeletal  (+) Arthritis ,   Abdominal   Peds  Hematology   Anesthesia Other Findings Past Medical History: No date: Arthritis No date: Asthma No date: Bipolar 1 disorder (HCC) No date: COPD (chronic obstructive pulmonary disease) (HCC) No date: Dyspnea No date: Fibromyalgia No date: GERD (gastroesophageal reflux disease) No date: Headache No date: High cholesterol No date: Hypertension No date: 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 No date: Schizophrenia ([place]) No date: Seizures ([place]) No date: Stroke [facility])  Reproductive/Obstetrics                             Anesthesia Physical  Anesthesia Plan  ASA: 3  Anesthesia Plan: Spinal   Post-op Pain Management: Dilaudid IV and Regional block*   Induction: Intravenous  PONV Risk Score and Plan: 2 and  Ondansetron, Dexamethasone, Midazolam and Treatment may vary due to age or medical condition  Airway Management Planned: Natural Airway and Simple Face Mask  Additional Equipment: None  Intra-op Plan:   Post-operative Plan:   Informed Consent: I have reviewed the patients History and Physical, chart, labs and discussed the procedure including the risks, benefits and alternatives for the proposed anesthesia with the patient or authorized representative who has indicated his/her understanding and acceptance.     Dental advisory given  Plan Discussed with: CRNA and Surgeon  Anesthesia Plan Comments: (Discussed risks of anesthesia with patient, including PONV, sore throat, lip/dental/eye damage. Rare risks discussed as well, such as cardiorespiratory and neurological sequelae, and allergic reactions. Discussed the role of CRNA in patient's perioperative care. Patient understands.)        Anesthesia Quick Evaluation

## 2021-04-12 NOTE — Anesthesia Procedure Notes (Signed)
Spinal

## 2021-04-13 ENCOUNTER — Encounter: Payer: Self-pay | Admitting: Orthopedic Surgery

## 2021-04-13 LAB — BASIC METABOLIC PANEL
Anion gap: 8 (ref 5–15)
BUN: 6 mg/dL (ref 6–20)
CO2: 27 mmol/L (ref 22–32)
Calcium: 8.9 mg/dL (ref 8.9–10.3)
Chloride: 96 mmol/L — ABNORMAL LOW (ref 98–111)
Creatinine, Ser: 0.71 mg/dL (ref 0.61–1.24)
GFR, Estimated: >60 mL/min (ref >60)
Glucose, Bld: 122 mg/dL — ABNORMAL HIGH (ref 70–99)
Potassium: 4 mmol/L (ref 3.5–5.1)
Sodium: 131 mmol/L — ABNORMAL LOW (ref 135–145)

## 2021-04-13 LAB — CBC
HCT: 32.9 % — ABNORMAL LOW (ref 39.0–52.0)
Hemoglobin: 11.5 g/dL — ABNORMAL LOW (ref 13.0–17.0)
MCH: 30.5 pg (ref 26.0–34.0)
MCHC: 35 g/dL (ref 30.0–36.0)
MCV: 87.3 fL (ref 80.0–100.0)
Platelets: 187 10*3/uL (ref 150–400)
RBC: 3.77 MIL/uL — ABNORMAL LOW (ref 4.22–5.81)
RDW: 13.2 % (ref 11.5–15.5)
WBC: 10.8 10*3/uL — ABNORMAL HIGH (ref 4.0–10.5)
nRBC: 0 % (ref 0.0–0.2)

## 2021-04-13 MED ORDER — OXYCODONE HCL 5 MG PO TABS: 5.0000 mg | ORAL_TABLET | ORAL | 0 refills | Status: DC | PRN

## 2021-04-13 MED ORDER — OXYCODONE HCL 5 MG PO TABS
ORAL_TABLET | ORAL | Status: AC
  Filled 2021-04-13: qty 3

## 2021-04-13 MED ORDER — OXYCODONE HCL 5 MG PO TABS
ORAL_TABLET | ORAL | Status: AC
  Administered 2021-04-13: 10 mg via ORAL
  Filled 2021-04-13: qty 2

## 2021-04-13 MED ORDER — DOCUSATE SODIUM 100 MG PO CAPS: 100.0000 mg | ORAL_CAPSULE | Freq: Two times a day (BID) | ORAL | 0 refills | Status: DC

## 2021-04-13 MED ORDER — ONDANSETRON HCL 4 MG/2ML IJ SOLN
INTRAMUSCULAR | Status: AC
  Filled 2021-04-13: qty 2

## 2021-04-13 MED ORDER — FAMOTIDINE 20 MG PO TABS
ORAL_TABLET | ORAL | Status: AC
  Filled 2021-04-13: qty 1

## 2021-04-13 MED ORDER — METHOCARBAMOL 500 MG PO TABS
ORAL_TABLET | ORAL | Status: AC
  Filled 2021-04-13: qty 1

## 2021-04-13 MED ORDER — ASPIRIN 81 MG PO CHEW
CHEWABLE_TABLET | ORAL | Status: AC
  Filled 2021-04-13: qty 1

## 2021-04-13 MED ORDER — NICOTINE 21 MG/24HR TD PT24: 21.0000 mg | MEDICATED_PATCH | Freq: Every day | TRANSDERMAL | 0 refills | Status: DC

## 2021-04-13 MED ORDER — ASPIRIN 81 MG PO CHEW: 81.0000 mg | CHEWABLE_TABLET | Freq: Two times a day (BID) | ORAL | 0 refills | Status: DC

## 2021-04-13 MED ORDER — OXYCODONE HCL 5 MG PO TABS
ORAL_TABLET | ORAL | Status: AC
  Filled 2021-04-13: qty 2

## 2021-04-13 MED ORDER — DOCUSATE SODIUM 100 MG PO CAPS
ORAL_CAPSULE | ORAL | Status: AC
  Filled 2021-04-13: qty 1

## 2021-04-13 NOTE — Discharge Instructions (Addendum)
Continue weight bear as tolerated on the left lower extremity.    Elevate the left lower extremity whenever possible and continue the polar care while elevating the extremity. Patient may shower. No bath or submerging the wound.    Take asa as directed for blood clot prevention.  Continue to work on knee range of motion exercises at home as instructed by physical therapy. Continue to use a walker for assistance with ambulation until cleared by physical therapy.  Call [phone] with any questions, such as fever > 101.5 degrees, drainage from the wound or shortness of breath.   AMBULATORY SURGERY  DISCHARGE INSTRUCTIONS   The drugs that you were given will stay in your system until tomorrow so for the next 24 hours you should not:  Drive an automobile Make any legal decisions Drink any alcoholic beverage   You may resume regular meals tomorrow.  Today it is better to start with liquids and gradually work up to solid foods.  You may eat anything you prefer, but it is better to start with liquids, then soup and crackers, and gradually work up to solid foods.   Please notify your doctor immediately if you have any unusual bleeding, trouble breathing, redness and pain at the surgery site, drainage, fever, or pain not relieved by medication.    Additional Instructions:                       Information for Discharge Teaching:  DO NOT REMOVE TEAL EXPAREL BRACELET  FOR 4 days (96 hours) after surgery: April 16, 2021 EXPAREL (bupivacaine liposome injectable suspension)   Your surgeon or anesthesiologist gave you EXPAREL(bupivacaine) to help control your pain after surgery.  EXPAREL is a local anesthetic that provides pain relief by numbing the tissue around the surgical site. EXPAREL is designed to release pain medication over time and can control pain for up to 72 hours. Depending on how you respond to EXPAREL, you may require less pain medication during your recovery.  Possible side  effects: Temporary loss of sensation or ability to move in the area where bupivacaine was injected. Nausea, vomiting, constipation Rarely, numbness and tingling in your mouth or lips, lightheadedness, or anxiety may occur. Call your doctor right away if you think you may be experiencing any of these sensations, or if you have other questions regarding possible side effects.  Follow all other discharge instructions given to you by your surgeon or nurse. Eat a healthy diet and drink plenty of water or other fluids.  If you return to the hospital for any reason within 96 hours following the administration of EXPAREL, it is important for health care providers to know that you have received this anesthetic. A teal colored band has been placed on your arm with the date, time and amount of EXPAREL you have received in order to alert and inform your health care providers. Please leave this armband in place for the full 96 hours following administration, and then you may remove the band.         Please contact your physician with any problems or Same Day Surgery at [phone], Monday through Friday 6 am to 4 pm, or [place] at [facility] number at [phone].

## 2021-04-13 NOTE — Progress Notes (Signed)
PT Cancellation Note  Patient Details Name: Scott Howell MRN: [id number] DOB: 04-03-1970   Cancelled Treatment:    Reason Eval/Treat Not Completed: Other (comment) Arrived to see patient for pm session and he is dressed and ready for discharge. No needs identified at this time. Patient to go home.    , 04/13/2021, 1:00 PM

## 2021-04-13 NOTE — Progress Notes (Signed)
Pt had bloody drainage on dressing and the polar care was leaking on the dressing. Dressing saturated with water, dressing removed replaced with long honeycomb, not current bleeding noted. Polar care sleeve replaced. Dr. Odis Luster notified

## 2021-04-13 NOTE — Progress Notes (Signed)
Pt discharged from hospital via private vehicle. Discharge instructions given, patient and friend verbalizes understanding. DME equipment sent with patient.

## 2021-04-13 NOTE — Progress Notes (Signed)
Physical Therapy Treatment Patient Details Name: Scott Howell MRN: [id number] DOB: [birth year] Today's Date: 04/13/2021   History of Present Illness Patient is a 51 year old male s/p L TKA. PMH includes: seizure, bipolar d/o, schizophrenia, substance abuse, HTN, HLD, CVA, GERD.    PT Comments    Patient received lying on side in bed, reports pain minimal. 2/10. He is ready for PT this am. Patient independent with bed mobility, transfers with supervision only. Ambulated 300 feet with RW, min guard and ambulated up/down 4 steps with B rails and supervision. Patient required cues for sequencing on steps. Reviewed HEP program with patient and handout given. Patient educated on not to put pillow under left knee and encourage straightening. Patient progressing well and should be able to discharge home today from mobility standpoint. He will continue to benefit from skilled PT while here to improve independence, strength and ROM.      Recommendations for follow up therapy are one component of a multi-disciplinary discharge planning process, led by the attending physician.  Recommendations may be updated based on patient status, additional functional criteria and insurance authorization.  Follow Up Recommendations  Home health PT     Assistance Recommended at Discharge Set up Supervision/Assistance  Patient can return home with the following A little help with walking and/or transfers;A little help with bathing/dressing/bathroom;Assistance with cooking/housework;Assist for transportation;Help with stairs or ramp for entrance   Equipment Recommendations  Rolling walker (2 wheels)    Recommendations for Other Services       Precautions / Restrictions Precautions Precautions: Fall Restrictions Weight Bearing Restrictions: Yes LLE Weight Bearing: Weight bearing as tolerated     Mobility  Bed Mobility Overal bed mobility: Independent                  Transfers Overall transfer  level: Independent Equipment used: Rolling walker (2 wheels) Transfers: Sit to/from Stand Sit to Stand: Independent                Ambulation/Gait Ambulation/Gait assistance: Supervision Gait Distance (Feet): 300 Feet Assistive device: Rolling walker (2 wheels) Gait Pattern/deviations: Step-to pattern, Decreased step length - right, Decreased step length - left, Decreased stance time - left, Decreased stride length Gait velocity: decr     General Gait Details: Patient ambulating well, cues needed to put full foot on floor.   Stairs Stairs: Yes Stairs assistance: Supervision Stair Management: Two rails, Forwards Number of Stairs: 4 General stair comments: supervision and cues for sequencing   Wheelchair Mobility    Modified Rankin (Stroke Patients Only)       Balance Overall balance assessment: Needs assistance Sitting-balance support: Feet supported Sitting balance-Leahy Scale: Normal     Standing balance support: Bilateral upper extremity supported, During functional activity Standing balance-Leahy Scale: Fair Standing balance comment: min guard for ambulation with RW                            Cognition Arousal/Alertness: Awake/alert Behavior During Therapy: WFL for tasks assessed/performed Overall Cognitive Status: Within Functional Limits for tasks assessed                                 General Comments: patient cooperative, but lacking insight/safety awareness        Exercises Total Joint Exercises Ankle Circles/Pumps: AROM, Both, 10 reps Quad Sets: AROM, Left, 5 reps Hip ABduction/ADduction: AROM, Left,  10 reps Straight Leg Raises: AROM, Left, 5 reps Goniometric ROM: 10-85    General Comments        Pertinent Vitals/Pain Pain Assessment Pain Assessment: 0-10 Pain Score: 2  Pain Location: L knee Pain Descriptors / Indicators: Discomfort, Sore Pain Intervention(s): Monitored during session, Repositioned     Home Living                          Prior Function            PT Goals (current goals can now be found in the care plan section) Acute Rehab PT Goals Patient Stated Goal: return home PT Goal Formulation: With patient Time For Goal Achievement: 04/19/21 Potential to Achieve Goals: Good Progress towards PT goals: Progressing toward goals    Frequency    BID      PT Plan Current plan remains appropriate    Co-evaluation              AM-PAC PT "6 Clicks" Mobility   Outcome Measure  Help needed turning from your back to your side while in a flat bed without using bedrails?: None Help needed moving from lying on your back to sitting on the side of a flat bed without using bedrails?: None Help needed moving to and from a bed to a chair (including a wheelchair)?: A Little Help needed standing up from a chair using your arms (e.g., wheelchair or bedside chair)?: A Little Help needed to walk in hospital room?: A Little Help needed climbing 3-5 steps with a railing? : A Little 6 Click Score: 20    End of Session Equipment Utilized During Treatment: Gait belt Activity Tolerance: Patient tolerated treatment well Patient left: in bed;with call bell/phone within reach Nurse Communication: Mobility status PT Visit Diagnosis: Difficulty in walking, not elsewhere classified (R26.2);Pain;Muscle weakness (generalized) (M62.81) Pain - Right/Left: Left Pain - part of body: Knee     Time: 0626-9485 PT Time Calculation (min) (ACUTE ONLY): 23 min  Charges:  $Gait Training: 8-22 mins $Therapeutic Exercise: 8-22 mins                      , PT, GCS 04/13/21,9:20 AM

## 2021-04-13 NOTE — Discharge Summary (Signed)
Physician Discharge Summary  Patient ID: Scott Howell MRN: [id number] DOB/AGE: 1970/12/03 51 y.o.  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, left knee Principal Problem:   S/P TKR (total knee replacement) using cement, left   Past Medical History:  Diagnosis Date   Arthritis    Asthma    Bipolar 1 disorder (HCC)    COPD (chronic obstructive pulmonary disease) (HCC)    Dyspnea    Fibromyalgia    GERD (gastroesophageal reflux disease)    H/O ETOH abuse    Headache    High cholesterol    Homicidal ideation    Hypertension    Living accommodation issues    a.) as of 03/29/2021 --> living in camper   Marijuana use    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    He was given perioperative antibiotics:  Anti-infectives (From admission, onward)    Start     Dose/Rate Route Frequency Ordered Stop   04/12/21 1400  ceFAZolin (ANCEF) IVPB 2g/100 mL premix        2 g 200 mL/hr over 30 Minutes Intravenous Every 6 hours 04/12/21 1146 04/12/21 2034   04/12/21 0630  ceFAZolin (ANCEF) IVPB 2g/100 mL premix  Status:  Discontinued        2 g 200 mL/hr over 30 Minutes Intravenous On call to O.R. 04/12/21 3754 04/12/21 1137     .  He was given sequential compression devices, early  ambulation, and asa for DVT prophylaxis.  He benefited maximally from the hospital stay and there were no complications.    Recent vital signs:  Vitals:   04/13/21 0915 04/13/21 1144  BP: 122/70 130/78  Pulse: (!) 102 89  Resp: 18 20  Temp: (!) 97.5 F (36.4 C) 98.9 F (37.2 C)  SpO2: 95% 98%    Recent laboratory studies:  Lab Results  Component Value Date   HGB 11.5 (L) 04/13/2021   HGB 14.4 03/29/2021   HGB 14.8 12/16/2020   Lab Results  Component Value Date   WBC 10.8 (H) 04/13/2021   PLT 187 04/13/2021   Lab Results  Component Value Date   INR 1.0 11/26/2020   Lab Results  Component Value Date   NA 131 (L) 04/13/2021   K 4.0 04/13/2021   CL 96 (L) 04/13/2021   CO2 27 04/13/2021   BUN 6 04/13/2021   CREATININE 0.71 04/13/2021   GLUCOSE 122 (H) 04/13/2021    Discharge Medications:   Allergies as of 04/13/2021       Reactions   Acetaminophen Other (See Comments)   Pt states that it makes him hyper.  .         Medication List     TAKE these medications    ARIPiprazole 10 MG tablet Commonly known as: ABILIFY Take 10 mg by mouth daily.   aspirin  81 MG chewable tablet Chew 1 tablet (81 mg total) by mouth 2 (two) times daily.   atorvastatin 20 MG tablet Commonly known as: Lipitor Take 1 tablet (20 mg total) by mouth daily.   Biofreeze 10 % Crea Generic drug: Menthol (Topical Analgesic) Apply 1 application topically daily as needed.   budesonide-formoterol 160-4.5 MCG/ACT inhaler Commonly known as: SYMBICORT Inhale 2 puffs into the lungs 2 (two) times daily.   celecoxib 100 MG capsule Commonly known as: CELEBREX Take 100 mg by mouth 2 (two) times daily.   divalproex 250 MG DR tablet Commonly known as: DEPAKOTE TAKE 1 TABLET BY MOUTH 3 TIMES DAILY.   docusate sodium 100 MG capsule Commonly known as: COLACE Take 1 capsule (100 mg total) by mouth 2 (two) times daily.   famotidine 20 MG tablet Commonly known as: Pepcid Take 1 tablet (20  mg total) by mouth 2 (two) times daily.   ipratropium-albuterol 0.5-2.5 (3) MG/3ML Soln Commonly known as: DUONEB Take 3 mLs by nebulization every 6 (six) hours as needed.   lisinopril 10 MG tablet Commonly known as: ZESTRIL Take 1 tablet (10 mg total) by mouth daily.   methocarbamol 500 MG tablet Commonly known as: ROBAXIN Take 1 tablet (500 mg total) by mouth every 6 (six) hours as needed for muscle spasms.   nicotine 21 mg/24hr patch Commonly known as: NICODERM CQ - dosed in mg/24 hours Place 1 patch (21 mg total) onto the skin daily. Start taking on: April 14, 2021   oxyCODONE 5 MG immediate release tablet Commonly known as: Oxy IR/ROXICODONE Take 1 tablet (5 mg total) by mouth every 4 (four) hours as needed for moderate pain (pain score 4-6).   Ventolin HFA 108 (90 Base) MCG/ACT inhaler Generic drug: albuterol INHALE 2 PUFFS INTO LUNGS EVERY 6 HOURS AS NEEDED FOR WHEEZING               Durable Medical Equipment  (From admission, onward)           Start     Ordered   04/12/21 1559  For home use only DME Walker rolling  Once       Question Answer Comment  Walker: With 5 Inch Wheels   Patient needs a walker to treat with the following condition Weakness      04/12/21 1559            Diagnostic Studies: DG Knee Left Port  Result Date: 04/12/2021 CLINICAL DATA:  Postop left knee replacement EXAM: PORTABLE LEFT KNEE - 1-2 VIEW COMPARISON:  None. FINDINGS: The patient is status post left knee replacement. Femoral and tibial components are in good position. Skin staples and postoperative changes are noted. IMPRESSION: Left knee replacement as above. Electronically Signed   By: Gerome Sam III M.D.   On: 04/12/2021 11:43   [place] OR NERVE BLOCK-IMAGE ONLY [facility])  Result Date: 04/12/2021 There is no interpretation for this exam.  This order is for images obtained during a surgical procedure.  Please See "Surgeries" Tab for more information regarding the  procedure.    Disposition: Discharge disposition: 01-Home or Self Care            Signed: Lyndle Herrlich ,MD 04/13/2021, 12:58 PM

## 2021-04-13 NOTE — Anesthesia Postprocedure Evaluation (Signed)
Anesthesia Post Note  Patient: Scott Howell  Procedure(s) Performed: TOTAL KNEE ARTHROPLASTY (Left: Knee)  Patient location during evaluation: Nursing Unit Anesthesia Type: Spinal Level of consciousness: oriented and awake and alert Pain management: pain level controlled Vital Signs Assessment: post-procedure vital signs reviewed and stable Respiratory status: spontaneous breathing and respiratory function stable Cardiovascular status: blood pressure returned to baseline and stable Postop Assessment: no headache, no backache, no apparent nausea or vomiting and patient able to bend at knees Anesthetic complications: no   No notable events documented.   Last Vitals:  Vitals:   04/13/21 0400 04/13/21 0554  BP: (!) 101/53   Pulse: 98   Resp: 18   Temp: 37.7 C 37.1 C  SpO2: 98%     Last Pain:  Vitals:   04/13/21 0554  TempSrc: Temporal  PainSc:                  Estill Batten

## 2021-04-22 ENCOUNTER — Other Ambulatory Visit: Payer: Self-pay | Admitting: Family Medicine

## 2021-04-23 ENCOUNTER — Ambulatory Visit
Admission: RE | Admit: 2021-04-23 | Discharge: 2021-04-23 | Disposition: A | Discharge status: Home / Self Care | Payer: Medicaid Other | Source: Ambulatory Visit | Attending: Orthopedic Surgery | Admitting: Orthopedic Surgery

## 2021-04-23 ENCOUNTER — Other Ambulatory Visit: Payer: Self-pay

## 2021-04-23 ENCOUNTER — Other Ambulatory Visit: Payer: Self-pay | Admitting: Orthopedic Surgery

## 2021-04-23 DIAGNOSIS — R2242 Localized swelling, mass and lump, left lower limb: Secondary | ICD-10-CM | POA: Diagnosis present

## 2021-04-24 ENCOUNTER — Other Ambulatory Visit: Payer: Self-pay | Admitting: Internal Medicine

## 2021-04-24 ENCOUNTER — Other Ambulatory Visit: Payer: Self-pay | Admitting: Family Medicine

## 2021-04-24 DIAGNOSIS — K219 Gastro-esophageal reflux disease without esophagitis: Secondary | ICD-10-CM

## 2021-04-24 DIAGNOSIS — J449 Chronic obstructive pulmonary disease, unspecified: Secondary | ICD-10-CM

## 2021-04-24 MED ORDER — DIVALPROEX SODIUM 250 MG PO DR TAB: 250.0000 mg | DELAYED_RELEASE_TABLET | Freq: Three times a day (TID) | ORAL | 1 refills | Status: AC

## 2021-04-26 NOTE — Telephone Encounter (Signed)
Requested Prescriptions  ?Pending Prescriptions Disp Refills  ?? VENTOLIN HFA 108 (90 Base) MCG/ACT inhaler [Pharmacy Med Name: VENTOLIN HFA 90 MCG INHALER] 18 each 2  ?  Sig: INHALE 2 PUFFS INTO LUNGS EVERY 6 HOURS AS NEEDED FOR WHEEZING  ?  ? Pulmonology:  Beta Agonists 2 Passed - 04/24/2021  9:24 AM  ?  ?  Passed - Last BP in normal range  ?  BP Readings from Last 1 Encounters:  ?04/13/21 130/78  ?   ?  ?  Passed - Last Heart Rate in normal range  ?  Pulse Readings from Last 1 Encounters:  ?04/13/21 89  ?   ?  ?  Passed - Valid encounter within last 12 months  ?  Recent Outpatient Visits   ?      ? 2 months ago Hypertension goal BP (blood pressure) < 140/90  ? [facility] Margarita Mail, DO  ? 3 months ago Hypertension goal BP (blood pressure) < 140/90  ? [facility] Margarita Mail, DO  ? 4 months ago Seizure [facility])  ? [facility] Margarita Mail, DO  ? 5 years ago Influenza-like illness  ? [facility] Lada, Janit Bern, MD  ? 5 years ago Elevated liver enzymes  ? [facility] Lada, Janit Bern, MD  ?  ?  ?Future Appointments   ?        ? Tomorrow Margarita Mail, DO [facility]  ?  ? ?  ?  ?  ?? famotidine (PEPCID) 20 MG tablet [Pharmacy Med Name: FAMOTIDINE 20 MG TABLET] 180 tablet 1  ?  Sig: TAKE 1 TABLET BY MOUTH TWICE A DAY  ?  ? Gastroenterology:  H2 Antagonists Passed - 04/24/2021  9:24 AM  ?  ?  Passed - Valid encounter within last 12 months  ?  Recent Outpatient Visits   ?      ? 2 months ago Hypertension goal BP (blood pressure) < 140/90  ? [facility] Margarita Mail, DO  ? 3 months ago Hypertension goal BP (blood pressure) < 140/90  ? [facility] Margarita Mail, DO  ? 4 months ago Seizure [facility])  ? [facility] Margarita Mail, DO  ? 5 years ago Influenza-like illness  ? [facility]  Lada, Janit Bern, MD  ? 5 years ago Elevated liver enzymes  ? [facility] Lada, Janit Bern, MD  ?  ?  ?Future Appointments   ?        ? Tomorrow Margarita Mail, DO [facility], [facility]  ?  ? ?  ?  ?  ?? SYMBICORT 160-4.5 MCG/ACT inhaler [Pharmacy Med Name: SYMBICORT 160-4.5 MCG INHALER] 30.6 each 2  ?  Sig: INHALE 2 PUFFS INTO THE LUNGS TWICE A DAY  ?  ? Pulmonology:  Combination Products Passed - 04/24/2021  9:24 AM  ?  ?  Passed - Valid encounter within last 12 months  ?  Recent Outpatient Visits   ?      ? 2 months ago Hypertension goal BP (blood pressure) < 140/90  ? [facility] Margarita Mail, DO  ? 3 months ago Hypertension goal BP (blood pressure) < 140/90  ? [facility] Margarita Mail, DO  ? 4 months ago Seizure [facility])  ? [facility] Margarita Mail, DO  ? 5 years ago Influenza-like  illness  ? [facility] Lada, Janit Bern, MD  ? 5 years ago Elevated liver enzymes  ? [facility] Lada, Janit Bern, MD  ?  ?  ?Future Appointments   ?        ? Tomorrow Margarita Mail, DO [facility], [facility]  ?  ? ?  ?  ?  ? ?

## 2021-04-27 ENCOUNTER — Encounter: Payer: Self-pay | Admitting: Internal Medicine

## 2021-04-27 ENCOUNTER — Ambulatory Visit (INDEPENDENT_AMBULATORY_CARE_PROVIDER_SITE_OTHER): Payer: Medicaid Other | Admitting: Internal Medicine

## 2021-04-27 VITALS — BP 126/76 | HR 108 | Temp 98.7°F | Resp 16 | Ht 73.0 in | Wt 211.3 lb

## 2021-04-27 DIAGNOSIS — E871 Hypo-osmolality and hyponatremia: Secondary | ICD-10-CM

## 2021-04-27 DIAGNOSIS — R569 Unspecified convulsions: Secondary | ICD-10-CM

## 2021-04-27 DIAGNOSIS — J449 Chronic obstructive pulmonary disease, unspecified: Secondary | ICD-10-CM | POA: Diagnosis not present

## 2021-04-27 DIAGNOSIS — E7800 Pure hypercholesterolemia, unspecified: Secondary | ICD-10-CM

## 2021-04-27 DIAGNOSIS — K219 Gastro-esophageal reflux disease without esophagitis: Secondary | ICD-10-CM

## 2021-04-27 DIAGNOSIS — I10 Essential (primary) hypertension: Secondary | ICD-10-CM | POA: Diagnosis not present

## 2021-04-27 DIAGNOSIS — G959 Disease of spinal cord, unspecified: Secondary | ICD-10-CM

## 2021-04-27 DIAGNOSIS — G8929 Other chronic pain: Secondary | ICD-10-CM

## 2021-04-27 DIAGNOSIS — Z1211 Encounter for screening for malignant neoplasm of colon: Secondary | ICD-10-CM

## 2021-04-27 DIAGNOSIS — F3160 Bipolar disorder, current episode mixed, unspecified: Secondary | ICD-10-CM

## 2021-04-27 DIAGNOSIS — M25562 Pain in left knee: Secondary | ICD-10-CM

## 2021-04-27 NOTE — Patient Instructions (Addendum)
It was great seeing you today! ? ?Plan discussed at today's visit: ?-Blood work ordered today, results will be uploaded to MyChart.  ?-Referral to GI for colon cancer screening placed ?-Nebulizer machine ordered  ? ?Follow up in: 6 months  ? ?Take care and let us know if you have any questions or concerns prior to your next visit. ? ?Dr. Caralee Ates ? ?

## 2021-04-27 NOTE — Assessment & Plan Note (Signed)
Referral placed to Psychiatry at [facility], saw someone over telemedicine who started Abilify, which he is unconformable with. Looking for new Psychiatrist. Moods stable currently.  ?

## 2021-04-27 NOTE — Assessment & Plan Note (Signed)
Stable, will give another DME order to get nebulizer machine. Continue inhalers.  ?

## 2021-04-27 NOTE — Assessment & Plan Note (Signed)
Stable, continue current medications.  

## 2021-04-27 NOTE — Assessment & Plan Note (Signed)
Recently underwent TKR, doing well with physical therapy.  ?

## 2021-04-27 NOTE — Assessment & Plan Note (Signed)
No seizures since hospitalization in the fall, has Neuro appointment on 3/24. ?

## 2021-04-27 NOTE — Assessment & Plan Note (Signed)
Had surgical procedure in November, 2022. Stable.  ?
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
## 2021-05-04 ENCOUNTER — Other Ambulatory Visit: Payer: Self-pay | Admitting: Internal Medicine

## 2021-05-04 DIAGNOSIS — J449 Chronic obstructive pulmonary disease, unspecified: Secondary | ICD-10-CM

## 2021-05-04 NOTE — Telephone Encounter (Signed)
Medication Refill - Medication: ipratropium-albuterol (DUONEB) 0.5-2.5 (3) MG/3ML SOLN ? ?Pt requesting medication for his Nebulizer machine. ? ?Has the patient contacted their pharmacy? No. No, more refills.  ?(Agent: If no, request that the patient contact the pharmacy for the refill. If patient does not wish to contact the pharmacy document the reason why and proceed with request.) ? ? ?Preferred Pharmacy (with phone number or street name):  ?[id number] - [place], [place] - [address]  ?[address] [place] [address]  ?Phone: [phone] Fax: [phone]  ?Hours: Not open 24 hours  ? ?Has the patient been seen for an appointment in the last year OR does the patient have an upcoming appointment? Yes.   ? ?Agent: Please be advised that RX refills may take up to 3 business days. We ask that you follow-up with your pharmacy.  ?

## 2021-05-06 MED ORDER — IPRATROPIUM-ALBUTEROL 0.5-2.5 (3) MG/3ML IN SOLN: 3.0000 mL | Freq: Four times a day (QID) | RESPIRATORY_TRACT | 1 refills | Status: DC | PRN

## 2021-05-06 NOTE — Telephone Encounter (Signed)
Requested Prescriptions  ?Pending Prescriptions Disp Refills  ?? ipratropium-albuterol (DUONEB) 0.5-2.5 (3) MG/3ML SOLN 360 mL 1  ?  Sig: Take 3 mLs by nebulization every 6 (six) hours as needed.  ?  ? Pulmonology:  Combination Products - albuterol / ipratropium Passed - 05/04/2021  4:29 PM  ?  ?  Passed - Last BP in normal range  ?  BP Readings from Last 1 Encounters:  ?04/27/21 126/76  ?   ?  ?  Passed - Last Heart Rate in normal range  ?  Pulse Readings from Last 1 Encounters:  ?04/27/21 (!) 108  ?   ?  ?  Passed - Valid encounter within last 12 months  ?  Recent Outpatient Visits   ?      ? 1 week ago Hypertension goal BP (blood pressure) < 140/90  ? [facility] Margarita Mail, DO  ? 3 months ago Hypertension goal BP (blood pressure) < 140/90  ? [facility] Margarita Mail, DO  ? 4 months ago Hypertension goal BP (blood pressure) < 140/90  ? [facility] Margarita Mail, DO  ? 4 months ago Seizure [facility])  ? [facility] Margarita Mail, DO  ? 5 years ago Influenza-like illness  ? [facility] Lada, Janit Bern, MD  ?  ?  ?Future Appointments   ?        ? In 5 months Margarita Mail, DO [facility]  ?  ? ?  ?  ?  ? ? ?

## 2021-06-23 ENCOUNTER — Other Ambulatory Visit: Payer: Self-pay | Admitting: Family Medicine

## 2021-06-23 NOTE — Telephone Encounter (Signed)
Following with Neurology who should continue seizure medication, patient called and he will contact Neurology office.  ?

## 2021-07-15 ENCOUNTER — Encounter: Payer: Self-pay | Admitting: Internal Medicine

## 2021-07-15 ENCOUNTER — Ambulatory Visit: Payer: Medicaid Other | Admitting: Internal Medicine

## 2021-07-15 VITALS — BP 122/80 | HR 51 | Resp 16 | Ht 73.0 in | Wt 206.8 lb

## 2021-07-15 DIAGNOSIS — T1490XA Injury, unspecified, initial encounter: Secondary | ICD-10-CM
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
## 2021-07-15 NOTE — Progress Notes (Deleted)
   Acute Office Visit  Subjective:     Patient ID: Scott Howell, male    DOB: April 03, 1970, 51 y.o.   MRN: [id number]  Chief Complaint  Patient presents with   Hand Pain    Right, slammed in door   Acquired Absence Of Right Breast    HPI Patient is in today for hand pain.  HAND PAIN Duration: {Blank single:19197::"days","weeks","months"} Involved hand: {Blank single:19197::"left","right","bilateral"} Mechanism of injury: {Blank single:19197::"trauma","FOOSH","unknown"} Location: {Blank single:19197::"dorsal","palmar","medial","lateral","diffuse"} Onset: {Blank single:19197::"sudden","gradual"} Severity: {Blank single:19197::"mild","moderate","severe","1/10","2/10","3/10","4/10","5/10","6/10","7/10","8/10","9/10","10/10"}  Quality: {Blank multiple:19196::"sharp","dull","aching","burning","cramping","ill-defined","itchy","pressure-like","pulling","shooting","sore","stabbing","tender","tearing","throbbing"} Frequency: {Blank single:19197::"constant","intermittent","occasional","rare","every few minutes","a few times a hour","a few times a day","a few times a week","a few times a month","a few times a year"} Radiation: {Blank single:19197::"yes","no"} Aggravating factors:  Alleviating factors:  Treatments attempted:  Relief with NSAIDs?: {Blank single:19197::"No NSAIDs Taken","no","mild","moderate","significant"} Weakness: {Blank single:19197::"yes","no"} Numbness: {Blank single:19197::"yes","no"} Redness: {Blank single:19197::"yes","no"} Swelling:{Blank single:19197::"yes","no"} Bruising: {Blank single:19197::"yes","no"} Fevers: {Blank single:19197::"yes","no"}   ROS      Objective:    BP 122/80   Pulse (!) 51   Resp 16   Ht 6\' 1"  (1.854 m)   Wt 206 lb 12.8 oz (93.8 kg)   SpO2 98%   BMI 27.28 kg/m  BP Readings from Last 3 Encounters:  07/15/21 122/80  04/27/21 126/76  04/13/21 130/78   Wt Readings from Last 3 Encounters:  07/15/21 206 lb 12.8 oz (93.8 kg)   04/27/21 211 lb 4.8 oz (95.8 kg)  04/12/21 220 lb (99.8 kg)      Physical Exam  No results found for any visits on 07/15/21.      Assessment & Plan:   Problem List Items Addressed This Visit   None   No orders of the defined types were placed in this encounter.   No follow-ups on file.  09/14/21, DO

## 2021-09-29 ENCOUNTER — Other Ambulatory Visit: Payer: Self-pay | Admitting: Neurosurgery

## 2021-09-29 MED ORDER — METHOCARBAMOL 750 MG PO TABS: 750.0000 mg | ORAL_TABLET | Freq: Three times a day (TID) | ORAL | 1 refills | Status: DC | PRN

## 2021-10-28 ENCOUNTER — Ambulatory Visit: Payer: Medicaid Other | Admitting: Internal Medicine

## 2021-11-01 ENCOUNTER — Other Ambulatory Visit: Payer: Self-pay | Admitting: Surgery

## 2021-11-01 ENCOUNTER — Encounter: Payer: Self-pay | Admitting: Internal Medicine

## 2021-11-01 ENCOUNTER — Ambulatory Visit: Payer: Medicaid Other | Admitting: Internal Medicine

## 2021-11-01 VITALS — BP 118/62 | HR 91 | Temp 98.9°F | Resp 18 | Ht 73.0 in | Wt 214.7 lb

## 2021-11-01 DIAGNOSIS — Z8616 Personal history of COVID-19: Secondary | ICD-10-CM

## 2021-11-01 DIAGNOSIS — U071 COVID-19: Secondary | ICD-10-CM | POA: Diagnosis not present

## 2021-11-01 DIAGNOSIS — R051 Acute cough: Secondary | ICD-10-CM

## 2021-11-01 DIAGNOSIS — J069 Acute upper respiratory infection, unspecified: Secondary | ICD-10-CM

## 2021-11-01 HISTORY — DX: Personal history of COVID-19: Z86.16

## 2021-11-01 HISTORY — DX: COVID-19: U07.1

## 2021-11-01 MED ORDER — METHYLPREDNISOLONE 4 MG PO TBPK: ORAL_TABLET | ORAL | 0 refills | Status: DC

## 2021-11-01 MED ORDER — FLUTICASONE PROPIONATE 50 MCG/ACT NA SUSP: 2.0000 | Freq: Every day | NASAL | 6 refills | Status: DC

## 2021-11-01 MED ORDER — BENZONATATE 100 MG PO CAPS: 100.0000 mg | ORAL_CAPSULE | Freq: Two times a day (BID) | ORAL | 0 refills | Status: DC | PRN

## 2021-11-01 NOTE — Patient Instructions (Addendum)
It was great seeing you today!  Plan discussed at today's visit: -COVID/flu testing today -Steroid pack sent to pharmacy as well as nasal spray (use nasal saline first) and cough suppressant. Continue Coricidin for high blood pressure and cough drops   Follow up in: 1 month   Take care and let us know if you have any questions or concerns prior to your next visit.  Dr. Rosana Berger

## 2021-11-01 NOTE — Progress Notes (Signed)
Established Patient Office Visit  Subjective:  Patient ID: Scott Howell, male    DOB: [birth year]  Age: 51 y.o. MRN: [id number]  CC:  Chief Complaint  Patient presents with   Follow-up   URI   Hyperlipidemia   Hypertension   COPD    HPI Scott Howell presents for cough, rhinorrhea.   URI Compliant:  -Worst symptom: started 9/16 - headache, rhinorrhea  -Fever: no, felt hot -Cough: yes, productive thick and white  -Shortness of breath: yes -Wheezing: yes -Chest pain: yes, with cough -Chest tightness: yes -Chest congestion: yes -Nasal congestion: yes -Runny nose: yes -Post nasal drip: yes -Sore throat: yes -Sinus pressure: yes -Headache: no -Ear pain: yes left -Ear pressure: yes left  -Vomiting: no -Diarrhea: yes -Fatigue: yes -Sick contacts: no -Context: worse -Relief with OTC cold/cough medications: no  -Treatments attempted: cough drops, corcidin HBP    Past Medical History:  Diagnosis Date   Arthritis    Asthma    Bipolar 1 disorder ([place])    COPD (chronic obstructive pulmonary disease) (HCC)    Dyspnea    Fibromyalgia    GERD (gastroesophageal reflux disease)    H/O ETOH abuse    Headache    High cholesterol    Homicidal ideation    Hypertension    Living accommodation issues    a.) as of 03/29/2021 --> living in camper   Marijuana use    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)     Past Surgical History:  Procedure Laterality Date   ANTERIOR CERVICAL DECOMP/DISCECTOMY FUSION N/A 01/13/2021   Procedure: C3-5 ANTERIOR CERVICAL DECOMPRESSION/DISCECTOMY FUSION;  Surgeon: Meade Maw, MD;  Location: [facility] ORS;  Service: Neurosurgery;  Laterality: N/A;   CERVICAL SPINE SURGERY     4   HARDWARE REMOVAL Left 04/20/2015   Procedure: HARDWARE REMOVAL left leg;  Surgeon: Earnestine Leys, MD;  Location: [facility] ORS;  Service: Orthopedics;  Laterality: Left;   KNEE SURGERY Left     SHOULDER SURGERY Left    TOTAL KNEE ARTHROPLASTY Left 04/12/2021   Procedure: TOTAL KNEE ARTHROPLASTY;  Surgeon: Lovell Sheehan, MD;  Location: [facility] ORS;  Service: Orthopedics;  Laterality: Left;    Family History  Problem Relation Age of Onset   Diabetes Mother    Hyperlipidemia Mother    Hypertension Mother    Diabetes Maternal Aunt    Cancer Maternal Aunt    Hyperlipidemia Maternal Aunt    Hypertension Maternal Aunt    Diabetes Maternal Uncle    Cancer Maternal Uncle    Hyperlipidemia Maternal Uncle    Hypertension Maternal Uncle     Social History   Socioeconomic History   Marital status: Single    Spouse name: Not on file   Number of children: Not on file   Years of education: Not on file   Highest education level: Not on file  Occupational History   Not on file  Tobacco Use   Smoking status: Every Day    Packs/day: 1.50    Years: 30.00    Total pack years: 45.00    Types: Cigarettes   Smokeless tobacco: Never  Vaping Use   Vaping Use: Never used  Substance and Sexual Activity   Alcohol use: No    Comment: Sober for 10 yrs.   Drug use: Yes    Types: Marijuana   Sexual activity: Never  Other Topics Concern   Not on file  Social History Narrative   Not on file   Social Determinants of Health   Financial Resource Strain: Not on file  Food Insecurity: Not on file  Transportation Needs: Not on file  Physical Activity: Not on file  Stress: Not on file  Social Connections: Not on file  Intimate Partner Violence: Not on file    Outpatient Medications Prior to Visit  Medication Sig Dispense Refill   aspirin 81 MG chewable tablet Chew 1 tablet (81 mg total) by mouth 2 (two) times daily. 60 tablet 0   atorvastatin (LIPITOR) 20 MG tablet Take 1 tablet (20 mg total) by mouth daily. 90 tablet 3   celecoxib (CELEBREX) 100 MG capsule Take 100 mg by mouth 2 (two) times daily.     divalproex (DEPAKOTE) 250 MG DR tablet Take 1 tablet (250 mg total) by mouth 3  (three) times daily. 90 tablet 1   docusate sodium (COLACE) 100 MG capsule Take 1 capsule (100 mg total) by mouth 2 (two) times daily. 10 capsule 0   famotidine (PEPCID) 20 MG tablet TAKE 1 TABLET BY MOUTH TWICE A DAY 180 tablet 1   ipratropium-albuterol (DUONEB) 0.5-2.5 (3) MG/3ML SOLN Take 3 mLs by nebulization every 6 (six) hours as needed. 360 mL 1   lisinopril (ZESTRIL) 10 MG tablet Take 1 tablet (10 mg total) by mouth daily. 90 tablet 3   Menthol, Topical Analgesic, (BIOFREEZE) 10 % CREA Apply 1 application topically daily as needed.     methocarbamol (ROBAXIN-750) 750 MG tablet Take 1 tablet (750 mg total) by mouth 3 (three) times daily as needed for muscle spasms. 90 tablet 1   nicotine (NICODERM CQ - DOSED IN MG/24 HOURS) 21 mg/24hr patch Place 1 patch (21 mg total) onto the skin daily. 28 patch 0   SYMBICORT 160-4.5 MCG/ACT inhaler INHALE 2 PUFFS INTO THE LUNGS TWICE A DAY 30.6 each 2   VENTOLIN HFA 108 (90 Base) MCG/ACT inhaler INHALE 2 PUFFS INTO LUNGS EVERY 6 HOURS AS NEEDED FOR WHEEZING 18 each 2   No facility-administered medications prior to visit.    Allergies  Allergen Reactions   Acetaminophen Other (See Comments)    Pt states that it makes him hyper.  .     ROS Review of Systems  Constitutional:  Positive for chills. Negative for fever.  HENT:  Positive for congestion, ear pain, postnasal drip, rhinorrhea, sinus pressure and sore throat.   Eyes:  Negative for visual disturbance.  Respiratory:  Positive for cough, shortness of breath and wheezing.   Cardiovascular:  Negative for chest pain.  Gastrointestinal:  Positive for diarrhea. Negative for abdominal pain, nausea and vomiting.  Neurological:  Negative for dizziness, seizures and headaches.      Objective:    Physical Exam Constitutional:      Appearance: Normal appearance.  HENT:     Head: Normocephalic and atraumatic.     Right Ear: Tympanic membrane, ear canal and external ear normal.     Left Ear:  Tympanic membrane, ear canal and external ear normal.     Nose: Congestion present.     Mouth/Throat:     Mouth: Mucous membranes are moist.     Pharynx: Oropharynx is clear.  Eyes:     Conjunctiva/sclera: Conjunctivae normal.  Cardiovascular:     Rate and Rhythm: Normal rate and regular rhythm.  Pulmonary:     Effort: Pulmonary effort is normal.     Breath  sounds: No wheezing, rhonchi or rales.     Comments: Poor air movement throughout Skin:    General: Skin is warm and dry.  Neurological:     General: No focal deficit present.     Mental Status: He is alert. Mental status is at baseline.  Psychiatric:        Mood and Affect: Mood normal.        Behavior: Behavior normal.     BP 118/62   Pulse 91   Temp 98.9 F (37.2 C)   Resp 18   Ht 6' 1"  (1.854 m)   Wt 214 lb 11.2 oz (97.4 kg)   SpO2 97%   BMI 28.33 kg/m  Wt Readings from Last 3 Encounters:  11/01/21 214 lb 11.2 oz (97.4 kg)  07/15/21 206 lb 12.8 oz (93.8 kg)  04/27/21 211 lb 4.8 oz (95.8 kg)     Health Maintenance Due  Topic Date Due   Fecal DNA (Cologuard)  Never done    There are no preventive care reminders to display for this patient.  Lab Results  Component Value Date   TSH 0.70 12/16/2020   Lab Results  Component Value Date   WBC 10.8 (H) 04/13/2021   HGB 11.5 (L) 04/13/2021   HCT 32.9 (L) 04/13/2021   MCV 87.3 04/13/2021   PLT 187 04/13/2021   Lab Results  Component Value Date   NA 131 (L) 04/13/2021   K 4.0 04/13/2021   CO2 27 04/13/2021   GLUCOSE 122 (H) 04/13/2021   BUN 6 04/13/2021   CREATININE 0.71 04/13/2021   BILITOT 0.4 12/16/2020   ALKPHOS 49 11/25/2020   AST 23 12/16/2020   ALT 17 12/16/2020   PROT 7.0 12/16/2020   ALBUMIN 4.5 11/25/2020   CALCIUM 8.9 04/13/2021   ANIONGAP 8 04/13/2021   EGFR 109 12/16/2020   Lab Results  Component Value Date   CHOL 195 12/16/2020   Lab Results  Component Value Date   HDL 32 (L) 12/16/2020   Lab Results  Component Value Date    LDLCALC 134 (H) 12/16/2020   Lab Results  Component Value Date   TRIG 157 (H) 12/16/2020   Lab Results  Component Value Date   CHOLHDL 6.1 (H) 12/16/2020   Lab Results  Component Value Date   HGBA1C 5.8 (H) 11/26/2020      Assessment & Plan:   1. Viral upper respiratory tract infection/Acute cough: COVID tested today. Continue inhalers, treat with steroid pack, cough suppressant and nasal steroids. Discussed wearing mask until COVID results come back. Continue to rest and stay well hydrated. Follow up in 1 month for follow up on chronic medical conditions or sooner if symptoms worsen or fail to improve.  - Novel Coronavirus, NAA ([facility]) - fluticasone (FLONASE) 50 MCG/ACT nasal spray; Place 2 sprays into both nostrils daily.  Dispense: 16 g; Refill: 6 - methylPREDNISolone (MEDROL DOSEPAK) 4 MG TBPK tablet; Day 1: Take 8 mg (2 tablets) before breakfast, 4 mg (1 tablet) after lunch, 4 mg (1 tablet) after supper, and 8 mg (2 tablets) at bedtime. Day 2:Take 4 mg (1 tablet) before breakfast, 4 mg (1 tablet) after lunch, 4 mg (1 tablet) after supper, and 8 mg (2 tablets) at bedtime. Day 3: Take 4 mg (1 tablet) before breakfast, 4 mg (1 tablet) after lunch, 4 mg (1 tablet) after supper, and 4 mg (1 tablet) at bedtime. Day 4: Take 4 mg (1 tablet) before breakfast, 4 mg (1 tablet) after lunch,  and 4 mg (1 tablet) at bedtime. Day 5: Take 4 mg (1 tablet) before breakfast and 4 mg (1 tablet) at bedtime. Day 6: Take 4 mg (1 tablet) before breakfast.  Dispense: 1 each; Refill: 0 - benzonatate (TESSALON) 100 MG capsule; Take 1 capsule (100 mg total) by mouth 2 (two) times daily as needed for cough.  Dispense: 20 capsule; Refill: 0   Follow-up: Return in about 4 weeks (around 11/29/2021).    Teodora Medici, DO

## 2021-11-02 LAB — NOVEL CORONAVIRUS, NAA: SARS-CoV-2, NAA: DETECTED — AB

## 2021-11-03 ENCOUNTER — Ambulatory Visit: Payer: Self-pay

## 2021-11-03 ENCOUNTER — Telehealth: Payer: Self-pay | Admitting: Internal Medicine

## 2021-11-03 ENCOUNTER — Encounter
Admission: RE | Admit: 2021-11-03 | Discharge: 2021-11-03 | Disposition: A | Discharge status: Home / Self Care | Payer: Medicaid Other | Source: Ambulatory Visit | Attending: Surgery | Admitting: Surgery

## 2021-11-03 DIAGNOSIS — Z8673 Personal history of transient ischemic attack (TIA), and cerebral infarction without residual deficits: Secondary | ICD-10-CM

## 2021-11-03 DIAGNOSIS — U071 COVID-19: Secondary | ICD-10-CM

## 2021-11-03 DIAGNOSIS — I10 Essential (primary) hypertension: Secondary | ICD-10-CM

## 2021-11-03 DIAGNOSIS — Z01818 Encounter for other preprocedural examination: Secondary | ICD-10-CM

## 2021-11-03 DIAGNOSIS — J449 Chronic obstructive pulmonary disease, unspecified: Secondary | ICD-10-CM

## 2021-11-03 HISTORY — DX: Opioid abuse, uncomplicated: F11.10

## 2021-11-03 HISTORY — DX: Tobacco use: Z72.0

## 2021-11-03 MED ORDER — NIRMATRELVIR/RITONAVIR (PAXLOVID)TABLET: 3.0000 | ORAL_TABLET | Freq: Two times a day (BID) | ORAL | 0 refills | Status: AC

## 2021-11-03 NOTE — Telephone Encounter (Signed)
Copied from [place] [id number]. Topic: General - Inquiry >> Nov 03, 2021  3:11 PM [name] wrote: Reason for CRM: Please see TE from NT 11/03/21 . Pt is upset requesting an update. Pt stated pharmacy advised him there is a conflict with his medication and is requesting a call back today.   Please advise.

## 2021-11-03 NOTE — Addendum Note (Signed)
Addended by: Teodora Medici on: 11/03/2021 07:41 AM   Modules accepted: Orders

## 2021-11-03 NOTE — Telephone Encounter (Signed)
Pharmacy already called and pt notified

## 2021-11-03 NOTE — Telephone Encounter (Signed)
Answer Assessment - Initial Assessment Questions 1. NAME of MEDICINE: "What medicine(s) are you calling about?"     Pt. States his pharmacist told him Paxlovid and Prednisone are contraindicated together. Won't fill medication without authorization from pcp. 2. QUESTION: "What is your question?" (e.g., double dose of medicine, side effect)     Above 3. PRESCRIBER: "Who prescribed the medicine?" Reason: if prescribed by specialist, call should be referred to that group.      4. SYMPTOMS: "Do you have any symptoms?" If Yes, ask: "What symptoms are you having?"  "How bad are the symptoms (e.g., mild, moderate, severe)     COVID positive 5. PREGNANCY:  "Is there any chance that you are pregnant?" "When was your last menstrual period?"     N/a  Protocols used: Medication Question Call-A-AH

## 2021-11-03 NOTE — Pre-Procedure Instructions (Addendum)
Called Tiffany at Dr Poggi's office to inform her that since pt tested positive for covid on 11-01-21 that the earliest he can have his surgery would be on 11-12-21. I informed Tiffany that pt needs labs and ekg. Tiffany asking if this can be done DOS. I said it can be done but if anything is abnormal anesthesia can cancel surgery. It would be preferred to have surgery moved out so we can have enough time for pt to come thru PAT for his labs and ekg. Tiffany to let Dr Roland Rack know of this

## 2021-11-03 NOTE — Telephone Encounter (Signed)
Requested medication (s) are due for refill today:   Yes  Requested medication (s) are on the active medication list:   Yes  Future visit scheduled:   Yes   Last ordered: 11/03/2021 #30, 0 refills  Returned because no protocol for this medication.   See pharmacy note   Requested Prescriptions  Pending Prescriptions Disp Refills   PAXLOVID, 300/100, 20 x 150 MG & 10 x 100MG  TBPK [Pharmacy Med Name: PAXLOVID 150(X2)-100MG PK (EUA)] 30 each 0    Sig: TAKE 3 TABLETS BY MOUTH 2 (TWO) TIMES DAILY FOR 5 DAYS. (TAKE NIRMATRELVIR 150 MG TWO TABLETS TWICE DAILY FOR 5 DAYS AND RITONAVIR 100 MG ONE TABLET TWICE DAILY FOR 5 DAYS) PATIENT GFR IS >60.     Off-Protocol Failed - 11/03/2021 11:37 AM      Failed - Medication not assigned to a protocol, review manually.      Passed - Valid encounter within last 12 months    Recent Outpatient Visits           2 days ago [facility] Teodora Medici, DO   3 months ago [facility] Teodora Medici, DO   6 months ago Hypertension goal BP (blood pressure) < 140/90   [place], DO   9 months ago Hypertension goal BP (blood pressure) < 140/90   [place], DO   10 months ago Hypertension goal BP (blood pressure) < 140/90   [facility] Teodora Medici, DO       Future Appointments             In 4 weeks Teodora Medici, [facility], [facility]

## 2021-11-03 NOTE — Telephone Encounter (Signed)
  Chief Complaint: COVID positive. Pharmacy will not fill these medications without ok from PCP. Pt. Requests we call his pharmacy  Symptoms: Yes Frequency: This week Pertinent Negatives: Patient denies fever Disposition: [] ED /[] Urgent Care (no appt availability in office) / [] Appointment(In office/virtual)/ []  [facility]/ [] Home Care/ [] Refused Recommended Disposition /[] [place] [facility]/ [x]  Follow-up with PCP Additional Notes: please advise

## 2021-11-03 NOTE — Telephone Encounter (Signed)
Pt called and stated that the pharmacy would not fill the paxlovid due to an interaction it has with another med the pt is taking / they are requesting a med change / please advise asap

## 2021-11-03 NOTE — Patient Instructions (Addendum)
Your procedure is scheduled on:11-17-21 Wednesday Report to the Registration Desk on the 1st floor of the [facility].Then proceed to the 2nd floor Surgery Desk To find out your arrival time, please call [phone] between 1PM - 3PM on:11-16-21 Tuesday If your arrival time is 6:00 am, do not arrive prior to that time as the [facility] entrance doors do not open until 6:00 am.  REMEMBER: Instructions that are not followed completely may result in serious medical risk, up to and including death; or upon the discretion of your surgeon and anesthesiologist your surgery may need to be rescheduled.  Do not eat food after midnight the night before surgery.  No gum chewing, lozengers or hard candies.  You may however, drink CLEAR liquids up to 2 hours before you are scheduled to arrive for your surgery. Do not drink anything within 2 hours of your scheduled arrival time.  Clear liquids include: - water  - apple juice without pulp - gatorade (not RED colors) - black coffee or tea (Do NOT add milk or creamers to the coffee or tea) Do NOT drink anything that is not on this list.  In addition, your doctor has ordered for you to drink the provided  Ensure Pre-Surgery Clear Carbohydrate Drink  Drinking this carbohydrate drink up to two hours before surgery helps to reduce insulin resistance and improve patient outcomes. Please complete drinking 2 hours prior to scheduled arrival time.  TAKE THESE MEDICATIONS THE MORNING OF SURGERY WITH A SIP OF WATER: -divalproex (DEPAKOTE)  -famotidine (PEPCID)  Use your ipratropium-albuterol (DUONEB) Breathing Treatment and Symbicort Inhaler the day of surgery and bring your Ventolin Inhaler to the hospital  One week prior to surgery: Stop Anti-inflammatories (NSAIDS) such as Advil, Aleve, Ibuprofen, Motrin, Naproxen, Naprosyn and Aspirin based products such as Excedrin, Goodys Powder, BC Powder.  Stop ANY OVER THE COUNTER supplements/vitamins NOW (11-03-21)  until after surgery.  No Alcohol for 24 hours before or after surgery.  No Smoking including e-cigarettes for 24 hours prior to surgery.  No chewable tobacco products for at least 6 hours prior to surgery.  No nicotine patches on the day of surgery.  Do not use any "recreational" drugs for at least a week prior to your surgery.  Please be advised that the combination of cocaine and anesthesia may have negative outcomes, up to and including death. If you test positive for cocaine, your surgery will be cancelled.  On the morning of surgery brush your teeth with toothpaste and water, you may rinse your mouth with mouthwash if you wish. Do not swallow any toothpaste or mouthwash.  Use CHG Soap as directed on instruction sheet.  Do not wear jewelry, make-up, hairpins, clips or nail polish.  Do not wear lotions, powders, or perfumes.   Do not shave body from the neck down 48 hours prior to surgery just in case you cut yourself which could leave a site for infection.  Also, freshly shaved skin may become irritated if using the CHG soap.  Contact lenses, hearing aids and dentures may not be worn into surgery.  Do not bring valuables to the hospital. [facility] is not responsible for any missing/lost belongings or valuables.   Notify your doctor if there is any change in your medical condition (cold, fever, infection).  Wear comfortable clothing (specific to your surgery type) to the hospital.  After surgery, you can help prevent lung complications by doing breathing exercises.  Take deep breaths and cough every 1-2 hours. Your  doctor may order a device called an Incentive Spirometer to help you take deep breaths. When coughing or sneezing, hold a pillow firmly against your incision with both hands. This is called "splinting." Doing this helps protect your incision. It also decreases belly discomfort.  If you are being admitted to the hospital overnight, leave your suitcase in the  car. After surgery it may be brought to your room.  If you are being discharged the day of surgery, you will not be allowed to drive home. You will need a responsible adult (18 years or older) to drive you home and stay with you that night.   If you are taking public transportation, you will need to have a responsible adult (18 years or older) with you. Please confirm with your physician that it is acceptable to use public transportation.   Please call the Pre-admissions Testing Dept. at [phone] if you have any questions about these instructions.  Surgery Visitation Policy:  Patients undergoing a surgery or procedure may have two family members or support persons with them as long as the person is not COVID-19 positive or experiencing its symptoms.    How to Use an Incentive Spirometer An incentive spirometer is a tool that measures how well you are filling your lungs with each breath. Learning to take long, deep breaths using this tool can help you keep your lungs clear and active. This may help to reverse or lessen your chance of developing breathing (pulmonary) problems, especially infection. You may be asked to use a spirometer: After a surgery. If you have a lung problem or a history of smoking. After a long period of time when you have been unable to move or be active. If the spirometer includes an indicator to show the highest number that you have reached, your health care provider or respiratory therapist will help you set a goal. Keep a log of your progress as told by your health care provider. What are the risks? Breathing too quickly may cause dizziness or cause you to pass out. Take your time so you do not get dizzy or light-headed. If you are in pain, you may need to take pain medicine before doing incentive spirometry. It is harder to take a deep breath if you are having pain. How to use your incentive spirometer  Sit up on the edge of your bed or on a chair. Hold the  incentive spirometer so that it is in an upright position. Before you use the spirometer, breathe out normally. Place the mouthpiece in your mouth. Make sure your lips are closed tightly around it. Breathe in slowly and as deeply as you can through your mouth, causing the piston or the ball to rise toward the top of the chamber. Hold your breath for 3-5 seconds, or for as long as possible. If the spirometer includes a coach indicator, use this to guide you in breathing. Slow down your breathing if the indicator goes above the marked areas. Remove the mouthpiece from your mouth and breathe out normally. The piston or ball will return to the bottom of the chamber. Rest for a few seconds, then repeat the steps 10 or more times. Take your time and take a few normal breaths between deep breaths so that you do not get dizzy or light-headed. Do this every 1-2 hours when you are awake. If the spirometer includes a goal marker to show the highest number you have reached (best effort), use this as a goal to work  toward during each repetition. After each set of 10 deep breaths, cough a few times. This will help to make sure that your lungs are clear. If you have an incision on your chest or abdomen from surgery, place a pillow or a rolled-up towel firmly against the incision when you cough. This can help to reduce pain while taking deep breaths and coughing. General tips When you are able to get out of bed: Walk around often. Continue to take deep breaths and cough in order to clear your lungs. Keep using the incentive spirometer until your health care provider says it is okay to stop using it. If you have been in the hospital, you may be told to keep using the spirometer at home. Contact a health care provider if: You are having difficulty using the spirometer. You have trouble using the spirometer as often as instructed. Your pain medicine is not giving enough relief for you to use the spirometer as  told. You have a fever. Get help right away if: You develop shortness of breath. You develop a cough with bloody mucus from the lungs. You have fluid or blood coming from an incision site after you cough. Summary An incentive spirometer is a tool that can help you learn to take long, deep breaths to keep your lungs clear and active. You may be asked to use a spirometer after a surgery, if you have a lung problem or a history of smoking, or if you have been inactive for a long period of time. Use your incentive spirometer as instructed every 1-2 hours while you are awake. If you have an incision on your chest or abdomen, place a pillow or a rolled-up towel firmly against your incision when you cough. This will help to reduce pain. Get help right away if you have shortness of breath, you cough up bloody mucus, or blood comes from your incision when you cough. This information is not intended to replace advice given to you by your health care provider. Make sure you discuss any questions you have with your health care provider. Document Revised: 04/22/2019 Document Reviewed: 04/22/2019 Elsevier Patient Education  [place].

## 2021-11-03 NOTE — Telephone Encounter (Signed)
Pharmacy called to go ahead and fill plaxovid and pt was instructed to stop medrol/steroid

## 2021-11-03 NOTE — Telephone Encounter (Signed)
Pt. Calling back. Upset "that no one has called [facility] yet about my medications, and I'm sitting here with COVID." Instructed pt. I will send a reminder for him. Please advise pt. Once reviewed.

## 2021-11-08 ENCOUNTER — Ambulatory Visit: Payer: Self-pay | Admitting: *Deleted

## 2021-11-08 NOTE — Telephone Encounter (Signed)
Pt notified, nothing more to do unless still feeling bad then he would need to follow-up

## 2021-11-08 NOTE — Telephone Encounter (Signed)
Message from [place] sent at 11/08/2021  9:38 AM EDT  Summary: Pt requests call back to advise of next steps since completing Covid med   Pt has completed his Covid medication and would like to discuss next steps. Pt requests call back asap. Cb# [phone]           Call History   Type Contact Phone/Fax User  11/08/2021 09:36 AM EDT Phone (Incoming) [place], [place] (Self) [phone] [place]   Reason for Disposition  [1] Caller has NON-URGENT medicine question about med that PCP prescribed AND [2] triager unable to answer question    Since had Covid does he still need a Covid booster?   Has surgery coming up.  Answer Assessment - Initial Assessment Questions 1. NAME of MEDICINE: "What medicine(s) are you calling about?"     Covid booster  2. QUESTION: "What is your question?" (e.g., double dose of medicine, side effect)     I'm for elbow surgery.  I just took my last Paxlovid this morning for Covid.   Do I still need to get a Covid booster and if so should I get it before or after my upcoming elbow surgery?  3. PRESCRIBER: "Who prescribed the medicine?" Reason: if prescribed by specialist, call should be referred to that group.     Dr. Teodora Medici 4. SYMPTOMS: "Do you have any symptoms?" If Yes, ask: "What symptoms are you having?"  "How bad are the symptoms (e.g., mild, moderate, severe)     My Covid symptoms are much better.   Having Covid wasn't bad at all. 5. PREGNANCY:  "Is there any chance that you are pregnant?" "When was your last menstrual period?"     N/A  Protocols used: Medication Question Call-A-AH . Chief Complaint: Took last Paxlovid for Covid this morning.   Has elbow surgery coming up.   Does he need a Covid booster since he has had Covid?   And if so should he get it before or after surgery? Symptoms: Covid symptoms "Are much better".   "It wasn't bad at all having Covid" Frequency: Last Paxlovid this morning Pertinent Negatives:  Patient denies symptoms now Disposition: [] ED /[] Urgent Care (no appt availability in office) / [] Appointment(In office/virtual)/ []  [place] [facility]/ [] Home Care/ [] Refused Recommended Disposition /[]  Mobile Bus/ [x]  Follow-up with PCP Additional Notes: Message sent to Dr. Teodora Medici with his questions regarding the Covid booster.

## 2021-11-09 ENCOUNTER — Encounter
Admission: RE | Admit: 2021-11-09 | Discharge: 2021-11-09 | Disposition: A | Discharge status: Home / Self Care | Payer: Medicaid Other | Source: Ambulatory Visit | Attending: Surgery | Admitting: Surgery

## 2021-11-12 ENCOUNTER — Encounter
Admission: RE | Admit: 2021-11-12 | Discharge: 2021-11-12 | Disposition: A | Discharge status: Home / Self Care | Payer: Medicaid Other | Source: Ambulatory Visit | Attending: Surgery | Admitting: Surgery

## 2021-11-12 DIAGNOSIS — Z8673 Personal history of transient ischemic attack (TIA), and cerebral infarction without residual deficits: Secondary | ICD-10-CM | POA: Diagnosis not present

## 2021-11-12 DIAGNOSIS — J449 Chronic obstructive pulmonary disease, unspecified: Secondary | ICD-10-CM | POA: Insufficient documentation

## 2021-11-12 DIAGNOSIS — I10 Essential (primary) hypertension: Secondary | ICD-10-CM | POA: Insufficient documentation

## 2021-11-12 DIAGNOSIS — Z01818 Encounter for other preprocedural examination: Secondary | ICD-10-CM | POA: Diagnosis not present

## 2021-11-12 LAB — CBC
HCT: 47.3 % (ref 39.0–52.0)
Hemoglobin: 16.2 g/dL (ref 13.0–17.0)
MCH: 31 pg (ref 26.0–34.0)
MCHC: 34.2 g/dL (ref 30.0–36.0)
MCV: 90.6 fL (ref 80.0–100.0)
Platelets: 338 10*3/uL (ref 150–400)
RBC: 5.22 MIL/uL (ref 4.22–5.81)
RDW: 13.2 % (ref 11.5–15.5)
WBC: 9.5 10*3/uL (ref 4.0–10.5)
nRBC: 0 % (ref 0.0–0.2)

## 2021-11-12 LAB — BASIC METABOLIC PANEL
Anion gap: 5 (ref 5–15)
BUN: 10 mg/dL (ref 6–20)
CO2: 29 mmol/L (ref 22–32)
Calcium: 9.6 mg/dL (ref 8.9–10.3)
Chloride: 105 mmol/L (ref 98–111)
Creatinine, Ser: 0.8 mg/dL (ref 0.61–1.24)
GFR, Estimated: >60 mL/min (ref >60)
Glucose, Bld: 93 mg/dL (ref 70–99)
Potassium: 4 mmol/L (ref 3.5–5.1)
Sodium: 139 mmol/L (ref 135–145)

## 2021-11-17 ENCOUNTER — Ambulatory Visit: Payer: Medicaid Other | Admitting: Urgent Care

## 2021-11-17 ENCOUNTER — Encounter
Admission: RE | Disposition: A | Discharge status: Home / Self Care | Payer: Self-pay | Source: Home / Self Care | Attending: Surgery

## 2021-11-17 ENCOUNTER — Other Ambulatory Visit: Payer: Self-pay

## 2021-11-17 ENCOUNTER — Ambulatory Visit
Admission: RE | Admit: 2021-11-17 | Discharge: 2021-11-17 | Disposition: A | Discharge status: Home / Self Care | Payer: Medicaid Other | Attending: Surgery | Admitting: Surgery

## 2021-11-17 ENCOUNTER — Encounter: Payer: Self-pay | Admitting: Surgery

## 2021-11-17 ENCOUNTER — Ambulatory Visit: Payer: Medicaid Other | Admitting: Anesthesiology

## 2021-11-17 DIAGNOSIS — J449 Chronic obstructive pulmonary disease, unspecified: Secondary | ICD-10-CM | POA: Insufficient documentation

## 2021-11-17 DIAGNOSIS — I10 Essential (primary) hypertension: Secondary | ICD-10-CM | POA: Diagnosis not present

## 2021-11-17 DIAGNOSIS — Z7951 Long term (current) use of inhaled steroids: Secondary | ICD-10-CM | POA: Insufficient documentation

## 2021-11-17 DIAGNOSIS — F17210 Nicotine dependence, cigarettes, uncomplicated: Secondary | ICD-10-CM | POA: Diagnosis not present

## 2021-11-17 DIAGNOSIS — G709 Myoneural disorder, unspecified: Secondary | ICD-10-CM | POA: Insufficient documentation

## 2021-11-17 DIAGNOSIS — F319 Bipolar disorder, unspecified: Secondary | ICD-10-CM | POA: Insufficient documentation

## 2021-11-17 DIAGNOSIS — K219 Gastro-esophageal reflux disease without esophagitis: Secondary | ICD-10-CM | POA: Diagnosis not present

## 2021-11-17 DIAGNOSIS — F1290 Cannabis use, unspecified, uncomplicated: Secondary | ICD-10-CM | POA: Insufficient documentation

## 2021-11-17 DIAGNOSIS — G5621 Lesion of ulnar nerve, right upper limb: Secondary | ICD-10-CM | POA: Diagnosis present

## 2021-11-17 DIAGNOSIS — M797 Fibromyalgia: Secondary | ICD-10-CM | POA: Diagnosis not present

## 2021-11-17 DIAGNOSIS — E7800 Pure hypercholesterolemia, unspecified: Secondary | ICD-10-CM

## 2021-11-17 DIAGNOSIS — Z79899 Other long term (current) drug therapy: Secondary | ICD-10-CM | POA: Diagnosis not present

## 2021-11-17 HISTORY — PX: ULNAR NERVE TRANSPOSITION: SHX2595

## 2021-11-17 SURGERY — ULNAR NERVE DECOMPRESSION/TRANSPOSITION
Anesthesia: General | Site: Elbow | Laterality: Right

## 2021-11-17 MED ORDER — BUDESONIDE-FORMOTEROL FUMARATE 160-4.5 MCG/ACT IN AERO: 2.0000 | INHALATION_SPRAY | Freq: Two times a day (BID) | RESPIRATORY_TRACT | Status: DC

## 2021-11-17 MED ORDER — LIDOCAINE HCL (PF) 2 % IJ SOLN
INTRAMUSCULAR | Status: AC
  Filled 2021-11-17: qty 5

## 2021-11-17 MED ORDER — OXYCODONE HCL 5 MG PO TABS: 5.0000 mg | ORAL_TABLET | ORAL | Status: DC | PRN

## 2021-11-17 MED ORDER — MIDAZOLAM HCL 2 MG/2ML IJ SOLN
INTRAMUSCULAR | Status: AC
  Filled 2021-11-17: qty 2

## 2021-11-17 MED ORDER — BUPIVACAINE HCL (PF) 0.5 % IJ SOLN
INTRAMUSCULAR | Status: DC | PRN
  Administered 2021-11-17: 15 mL

## 2021-11-17 MED ORDER — SODIUM CHLORIDE 0.9 % IV SOLN: INTRAVENOUS | Status: DC

## 2021-11-17 MED ORDER — DEXAMETHASONE SODIUM PHOSPHATE 10 MG/ML IJ SOLN
INTRAMUSCULAR | Status: DC | PRN
  Administered 2021-11-17: 10 mg via INTRAVENOUS

## 2021-11-17 MED ORDER — CHLORHEXIDINE GLUCONATE 0.12 % MT SOLN
OROMUCOSAL | Status: AC
  Administered 2021-11-17: 15 mL via OROMUCOSAL
  Filled 2021-11-17: qty 15

## 2021-11-17 MED ORDER — LACTATED RINGERS IV SOLN: INTRAVENOUS | Status: DC

## 2021-11-17 MED ORDER — DEXAMETHASONE SODIUM PHOSPHATE 10 MG/ML IJ SOLN
INTRAMUSCULAR | Status: AC
  Filled 2021-11-17: qty 1

## 2021-11-17 MED ORDER — OXYCODONE HCL 5 MG PO TABS
ORAL_TABLET | ORAL | Status: AC
  Filled 2021-11-17: qty 1

## 2021-11-17 MED ORDER — FENTANYL CITRATE (PF) 100 MCG/2ML IJ SOLN
INTRAMUSCULAR | Status: AC
  Administered 2021-11-17: 25 ug via INTRAVENOUS
  Filled 2021-11-17: qty 2

## 2021-11-17 MED ORDER — KETOROLAC TROMETHAMINE 30 MG/ML IJ SOLN
INTRAMUSCULAR | Status: DC | PRN
  Administered 2021-11-17: 30 mg via INTRAVENOUS

## 2021-11-17 MED ORDER — IPRATROPIUM-ALBUTEROL 0.5-2.5 (3) MG/3ML IN SOLN: 3.0000 mL | Freq: Two times a day (BID) | RESPIRATORY_TRACT | Status: DC

## 2021-11-17 MED ORDER — CEFAZOLIN SODIUM-DEXTROSE 2-4 GM/100ML-% IV SOLN
2.0000 g | INTRAVENOUS | Status: AC
  Administered 2021-11-17: 2 g via INTRAVENOUS

## 2021-11-17 MED ORDER — LISINOPRIL 10 MG PO TABS: 10.0000 mg | ORAL_TABLET | Freq: Every evening | ORAL | Status: DC

## 2021-11-17 MED ORDER — METOCLOPRAMIDE HCL 5 MG/ML IJ SOLN: 5.0000 mg | Freq: Three times a day (TID) | INTRAMUSCULAR | Status: DC | PRN

## 2021-11-17 MED ORDER — ATORVASTATIN CALCIUM 20 MG PO TABS: 20.0000 mg | ORAL_TABLET | Freq: Every evening | ORAL | Status: DC

## 2021-11-17 MED ORDER — KETOROLAC TROMETHAMINE 30 MG/ML IJ SOLN
INTRAMUSCULAR | Status: AC
  Filled 2021-11-17: qty 1

## 2021-11-17 MED ORDER — METOCLOPRAMIDE HCL 10 MG PO TABS: 5.0000 mg | ORAL_TABLET | Freq: Three times a day (TID) | ORAL | Status: DC | PRN

## 2021-11-17 MED ORDER — MIDAZOLAM HCL 2 MG/2ML IJ SOLN
INTRAMUSCULAR | Status: DC | PRN
  Administered 2021-11-17: 2 mg via INTRAVENOUS

## 2021-11-17 MED ORDER — ONDANSETRON HCL 4 MG/2ML IJ SOLN
INTRAMUSCULAR | Status: AC
  Filled 2021-11-17: qty 2

## 2021-11-17 MED ORDER — ONDANSETRON HCL 4 MG/2ML IJ SOLN
INTRAMUSCULAR | Status: DC | PRN
  Administered 2021-11-17: 4 mg via INTRAVENOUS

## 2021-11-17 MED ORDER — ONDANSETRON HCL 4 MG PO TABS: 4.0000 mg | ORAL_TABLET | Freq: Four times a day (QID) | ORAL | Status: DC | PRN

## 2021-11-17 MED ORDER — PROPOFOL 10 MG/ML IV BOLUS
INTRAVENOUS | Status: DC | PRN
  Administered 2021-11-17: 200 mg via INTRAVENOUS

## 2021-11-17 MED ORDER — ONDANSETRON HCL 4 MG/2ML IJ SOLN: 4.0000 mg | Freq: Four times a day (QID) | INTRAMUSCULAR | Status: DC | PRN

## 2021-11-17 MED ORDER — FENTANYL CITRATE (PF) 100 MCG/2ML IJ SOLN
INTRAMUSCULAR | Status: AC
  Filled 2021-11-17: qty 2

## 2021-11-17 MED ORDER — BUPIVACAINE HCL (PF) 0.5 % IJ SOLN
INTRAMUSCULAR | Status: AC
  Filled 2021-11-17: qty 30

## 2021-11-17 MED ORDER — METHOCARBAMOL 750 MG PO TABS: 1500.0000 mg | ORAL_TABLET | Freq: Two times a day (BID) | ORAL | Status: DC | PRN

## 2021-11-17 MED ORDER — PROPOFOL 10 MG/ML IV BOLUS
INTRAVENOUS | Status: AC
  Filled 2021-11-17: qty 20

## 2021-11-17 MED ORDER — FENTANYL CITRATE (PF) 100 MCG/2ML IJ SOLN
25.0000 ug | INTRAMUSCULAR | Status: DC | PRN
  Administered 2021-11-17: 25 ug via INTRAVENOUS
  Administered 2021-11-17: 50 ug via INTRAVENOUS

## 2021-11-17 MED ORDER — CHLORHEXIDINE GLUCONATE 0.12 % MT SOLN: 15.0000 mL | Freq: Once | OROMUCOSAL | Status: AC

## 2021-11-17 MED ORDER — CEFAZOLIN SODIUM-DEXTROSE 2-4 GM/100ML-% IV SOLN
INTRAVENOUS | Status: AC
  Filled 2021-11-17: qty 100

## 2021-11-17 MED ORDER — LIDOCAINE HCL (CARDIAC) PF 100 MG/5ML IV SOSY
PREFILLED_SYRINGE | INTRAVENOUS | Status: DC | PRN
  Administered 2021-11-17: 100 mg via INTRAVENOUS

## 2021-11-17 MED ORDER — ORAL CARE MOUTH RINSE: 15.0000 mL | Freq: Once | OROMUCOSAL | Status: AC

## 2021-11-17 MED ORDER — OXYCODONE HCL 5 MG PO TABS: 5.0000 mg | ORAL_TABLET | ORAL | 0 refills | Status: DC | PRN

## 2021-11-17 MED ORDER — FAMOTIDINE 20 MG PO TABS: 20.0000 mg | ORAL_TABLET | Freq: Two times a day (BID) | ORAL | Status: DC

## 2021-11-17 MED ORDER — FENTANYL CITRATE (PF) 100 MCG/2ML IJ SOLN
INTRAMUSCULAR | Status: DC | PRN
  Administered 2021-11-17 (×2): 50 ug via INTRAVENOUS

## 2021-11-17 MED ORDER — DEXMEDETOMIDINE HCL IN NACL 200 MCG/50ML IV SOLN
INTRAVENOUS | Status: DC | PRN
  Administered 2021-11-17 (×2): 8 ug via INTRAVENOUS

## 2021-11-17 MED ORDER — OXYCODONE HCL 5 MG PO TABS
5.0000 mg | ORAL_TABLET | Freq: Once | ORAL | Status: AC | PRN
  Administered 2021-11-17: 5 mg via ORAL

## 2021-11-17 MED ORDER — 0.9 % SODIUM CHLORIDE (POUR BTL) OPTIME
TOPICAL | Status: DC | PRN
  Administered 2021-11-17: 500 mL

## 2021-11-17 MED ORDER — OXYCODONE HCL 5 MG/5ML PO SOLN: 5.0000 mg | Freq: Once | ORAL | Status: AC | PRN

## 2021-11-17 SURGICAL SUPPLY — 42 items
APL PRP STRL LF DISP 70% ISPRP (MISCELLANEOUS) ×2
BNDG CMPR 5X4 CHSV STRCH STRL (GAUZE/BANDAGES/DRESSINGS) ×1
BNDG CMPR STD VLCR NS LF 5.8X4 (GAUZE/BANDAGES/DRESSINGS) ×1
BNDG COHESIVE 4X5 TAN STRL LF (GAUZE/BANDAGES/DRESSINGS) ×1 IMPLANT
BNDG ELASTIC 4X5.8 VLCR NS LF (GAUZE/BANDAGES/DRESSINGS) ×1 IMPLANT
BNDG ESMARK 4X12 TAN STRL LF (GAUZE/BANDAGES/DRESSINGS) ×1 IMPLANT
CHLORAPREP W/TINT 26 (MISCELLANEOUS) ×2 IMPLANT
CORD BIP STRL DISP 12FT (MISCELLANEOUS) ×1 IMPLANT
CUFF TOURN SGL QUICK 18X4 (TOURNIQUET CUFF) IMPLANT
CUFF TOURN SGL QUICK 24 (TOURNIQUET CUFF)
CUFF TRNQT CYL 24X4X16.5-23 (TOURNIQUET CUFF) IMPLANT
ELECT REM PT RETURN 9FT ADLT (ELECTROSURGICAL) ×1
ELECTRODE REM PT RTRN 9FT ADLT (ELECTROSURGICAL) ×1 IMPLANT
FORCEPS JEWEL BIP 4-3/4 STR (INSTRUMENTS) ×1 IMPLANT
GAUZE SPONGE 4X4 12PLY STRL (GAUZE/BANDAGES/DRESSINGS) ×1 IMPLANT
GAUZE XEROFORM 1X8 LF (GAUZE/BANDAGES/DRESSINGS) ×1 IMPLANT
GLOVE SURG UNDER LTX SZ8 (GLOVE) ×1 IMPLANT
GOWN STRL REUS W/ TWL LRG LVL3 (GOWN DISPOSABLE) ×1 IMPLANT
GOWN STRL REUS W/ TWL XL LVL3 (GOWN DISPOSABLE) ×1 IMPLANT
GOWN STRL REUS W/TWL LRG LVL3 (GOWN DISPOSABLE) ×1
GOWN STRL REUS W/TWL XL LVL3 (GOWN DISPOSABLE) ×1
KIT TURNOVER KIT A (KITS) ×1 IMPLANT
LOOP RED MAXI  1X406MM (MISCELLANEOUS) ×1
LOOP VESSEL MAXI  1X406 RED (MISCELLANEOUS) ×1
LOOP VESSEL MAXI 1X406 RED (MISCELLANEOUS) ×1 IMPLANT
MANIFOLD NEPTUNE II (INSTRUMENTS) ×1 IMPLANT
NS IRRIG 500ML POUR BTL (IV SOLUTION) ×1 IMPLANT
PACK EXTREMITY ARMC (MISCELLANEOUS) ×1 IMPLANT
PAD ABD DERMACEA PRESS 5X9 (GAUZE/BANDAGES/DRESSINGS) ×1 IMPLANT
PAD CAST 4YDX4 CTTN HI CHSV (CAST SUPPLIES) ×3 IMPLANT
PADDING CAST COTTON 4X4 STRL (CAST SUPPLIES) ×3
SLING ARM LRG DEEP (SOFTGOODS) IMPLANT
STAPLER SKIN PROX 35W (STAPLE) IMPLANT
STOCKINETTE IMPERVIOUS 9X36 MD (GAUZE/BANDAGES/DRESSINGS) ×1 IMPLANT
SUT VIC AB 0 CT1 36 (SUTURE) IMPLANT
SUT VIC AB 2-0 CT1 (SUTURE) ×1 IMPLANT
SUT VIC AB 2-0 CT1 36 (SUTURE) IMPLANT
SUT VIC AB 4-0 SH 27 (SUTURE) ×1
SUT VIC AB 4-0 SH 27XANBCTRL (SUTURE) IMPLANT
SWABSTK COMLB BENZOIN TINCTURE (MISCELLANEOUS) ×1 IMPLANT
TRAP FLUID SMOKE EVACUATOR (MISCELLANEOUS) ×1 IMPLANT
WATER STERILE IRR 500ML POUR (IV SOLUTION) ×1 IMPLANT

## 2021-11-17 NOTE — Anesthesia Preprocedure Evaluation (Signed)
Anesthesia Evaluation  Patient identified by MRN, date of birth, ID band Patient awake    Reviewed: Allergy & Precautions, NPO status , Patient's Chart, lab work & pertinent test results  History of Anesthesia Complications Negative for: history of anesthetic complications  Airway Mallampati: III  TM Distance: <3 FB Neck ROM: full    Dental  (+) Missing   Pulmonary shortness of breath and with exertion, asthma , COPD, Current Smoker and Patient abstained from smoking.,    Pulmonary exam normal        Cardiovascular Exercise Tolerance: Good hypertension, (-) anginaNormal cardiovascular exam     Neuro/Psych  Headaches, Seizures -,  PSYCHIATRIC DISORDERS  Neuromuscular disease CVA    GI/Hepatic Neg liver ROS, GERD  Controlled,  Endo/Other  negative endocrine ROS  Renal/GU      Musculoskeletal   Abdominal   Peds  Hematology negative hematology ROS (+)   Anesthesia Other Findings Past Medical History: No date: Arthritis No date: Asthma No date: Bipolar 1 disorder (HCC) No date: COPD (chronic obstructive pulmonary disease) (HCC) No date: Drug abuse, opioid type (HCC) No date: Dyspnea No date: Fibromyalgia No date: GERD (gastroesophageal reflux disease) No date: H/O ETOH abuse No date: Headache No date: High cholesterol 11/01/2021: History of 2019 novel coronavirus disease (COVID-19) No date: Homicidal ideation No date: Hypertension No date: Living accommodation issues     Comment:  a.) as of 03/29/2021 --> living in camper No date: Marijuana use No date: 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 No date: Schizophrenia ([place]) 11/2020: Seizures ([place]) No date: Stroke [facility])     Comment:  no deficits No date: Tobacco use  Past Surgical History: 01/13/2021: ANTERIOR CERVICAL DECOMP/DISCECTOMY FUSION; N/A     Comment:  Procedure: C3-5 ANTERIOR CERVICAL               DECOMPRESSION/DISCECTOMY FUSION;   Surgeon: Meade Maw, MD;  Location: [facility] ORS;  Service: Neurosurgery;              Laterality: N/A; No date: CERVICAL SPINE SURGERY     Comment:  4 No date: CLAVICLE SURGERY 04/20/2015: HARDWARE REMOVAL; Left     Comment:  Procedure: HARDWARE REMOVAL left leg;  Surgeon: Earnestine Leys, MD;  Location: [facility] ORS;  Service: Orthopedics;                Laterality: Left; No date: KNEE SURGERY; Left No date: NECK SURGERY     Comment:  x6 No date: SHOULDER SURGERY; Left 04/12/2021: TOTAL KNEE ARTHROPLASTY; Left     Comment:  Procedure: TOTAL KNEE ARTHROPLASTY;  Surgeon: Lovell Sheehan, MD;  Location: [facility] ORS;  Service: Orthopedics;               Laterality: Left;  BMI    Body Mass Index: 28.63 kg/m      Reproductive/Obstetrics negative OB ROS                             Anesthesia Physical Anesthesia Plan  ASA: 3  Anesthesia Plan: General LMA   Post-op Pain Management:    Induction: Intravenous  PONV Risk Score and Plan: Dexamethasone, Ondansetron, Midazolam and  Treatment may vary due to age or medical condition  Airway Management Planned: LMA  Additional Equipment:   Intra-op Plan:   Post-operative Plan: Extubation in OR  Informed Consent: I have reviewed the patients History and Physical, chart, labs and discussed the procedure including the risks, benefits and alternatives for the proposed anesthesia with the patient or authorized representative who has indicated his/her understanding and acceptance.     Dental Advisory Given  Plan Discussed with: Anesthesiologist, CRNA and Surgeon  Anesthesia Plan Comments: (Patient consented for risks of anesthesia including but not limited to:  - adverse reactions to medications - damage to eyes, teeth, lips or other oral mucosa - nerve damage due to positioning  - sore throat or hoarseness - Damage to heart, brain, nerves, lungs, other parts of body  or loss of life  Patient voiced understanding.)        Anesthesia Quick Evaluation

## 2021-11-17 NOTE — H&P (Signed)
History of Present Illness:  Scott Howell. is a 51 y.o. 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 The patient notes that his symptoms are unchanged and in fact may be somewhat worse since undergoing the EMG several weeks ago. He rates his pain at 8/10. Has been taking Lodine on a daily basis, as well as wearing an elbow brace which he states provides only limited relief of his symptoms. He continues to experience substantial weakness to his hand and is still dropping things. He is ready to consider more aggressive treatment options at this time.  Current Outpatient Medications: albuterol (PROVENTIL HFA) 90 mcg/actuation inhaler Inhale 2 inhalations into the lungs every 6 (six) hours as needed. 1 Inhaler 11  ARIPiprazole (ABILIFY) 10 MG tablet 1 tab at night, needs appointment 15 tablet 0  atorvastatin (LIPITOR) 20 MG tablet Take 1 tablet (20 mg total) by mouth once daily. 30 tablet 11  beclomethasone (QVAR) 40 mcg/actuation inhaler Inhale 2 inhalations into the lungs 2 (two) times daily. 1 Inhaler 11  budesonide-formoteroL (SYMBICORT) 160-4.5 mcg/actuation inhaler Inhale 2 inhalations into the lungs 2 (two) times daily  divalproex (DEPAKOTE) 250 MG DR tablet Take 1 tablet (250 mg total) by mouth 2 (two) times daily 60 tablet 1  etodolac (LODINE) 500 MG tablet Take 1 tablet by mouth once daily  famotidine (PEPCID) 20 MG tablet Take 1 tablet by mouth once daily  hydrochlorothiazide (HYDRODIURIL) 25 MG tablet Take 1 tablet (25 mg total) by mouth once daily. 30 tablet 11  ipratropium-albuteroL (DUO-NEB) nebulizer solution Take 3 mLs by nebulization once daily  lisinopriL (ZESTRIL) 10 MG tablet Take 10 mg by mouth once daily  omeprazole (PRILOSEC) 20 MG DR capsule Take 1 capsule (20 mg total) by mouth once daily. PT NEEDS TO SCHEDULE OFFICE VISIT FOR ANY FURTHER  REFILLS. 30 capsule 0  QUEtiapine (SEROQUEL) 100 MG tablet Seroquel to 150 mg per night for one week. Then, increase to 200 mg per night for one week. Then, increase to 250 mg per night for one week. Finally, increase to 300 mg per night and continue. If you are sleeping well at a lower dose, can stay at that dose. 90 tablet 2  QUEtiapine (SEROQUEL) 25 MG tablet 25 mg at night for a week then increase to 50 mg at night for a week then increase to 75 mg at night for a week then increase to 100 mg at night and continue that dose. 120 tablet 3  tamsulosin (FLOMAX) 0.4 mg capsule Take 0.4 mg by mouth once daily  traMADoL (ULTRAM) 50 mg tablet Take 1 tablet (50 mg total) by mouth every 8 (eight) hours as needed for Pain 40 tablet 0   Allergies:  Acetaminophen (Palpitations and gives him the "shakes" - Pt states that it makes him hyper, but he still takes tylenol when needed)   Pt states that it makes him hyper. .   Past Medical History:  Alcohol abuse  Bipolar 1 disorder (CMS-HCC)  COPD (chronic obstructive pulmonary disease) (CMS-HCC)  GERD (gastroesophageal reflux disease)  History of stroke  Homicidal ideation  Hyperlipidemia  Hypertension  Knee fracture, left  Marijuana use  Schizophrenia (CMS-HCC)  Seizures (CMS-HCC)   Past Surgical History:  Hardware removal left leg 2017  C3-5 ANTERIOR CERVICAL DECOMPRESSION/DISCECTOMY FUSION 01/13/2021 (Dr. Venetia Night at [facility], [place])  Cervical spine surgery x 5  Left knee surgery  Left shoulder surgery  Posterior laminectomy   Family History:  No Known Problems Mother  No Known Problems Sister   Social History:   Socioeconomic History:  Marital status: Single  Tobacco Use  Smoking status: Every Day  Packs/day: 1.50  Types: Cigarettes  Smokeless tobacco: Never  Tobacco comments:  36 hand rolled cigarettes today  Vaping Use  Vaping Use: Never used  Substance and Sexual Activity  Alcohol use: No  Alcohol/week: 0.0 standard  drinks  Drug use: Yes  Types: Marijuana   Review of Systems:  A comprehensive 14 point ROS was performed, reviewed, and the pertinent orthopaedic findings are documented in the HPI.  Physical Exam: Vitals:  10/25/21 1127  BP: 120/78  Weight: 98.5 kg (217 lb 3.2 oz)  Height: 185.4 cm (6\' 1" )  PainSc: 8  PainLoc: Hand   General/Constitutional: The patient appears to be well-nourished, well-developed, and in no acute distress. Neuro/Psych: Normal mood and affect, oriented to person, place and time. Eyes: Non-icteric. Pupils are equal, round, and reactive to light, and exhibit synchronous movement. ENT: Unremarkable. Lymphatic: No palpable adenopathy. Respiratory: Lungs clear to auscultation, Normal chest excursion, No wheezes, and Non-labored breathing Cardiovascular: Regular rate and rhythm with mild holosystolic ejection murmur and No edema, swelling or tenderness, except as noted in detailed exam. Integumentary: No impressive skin lesions present, except as noted in detailed exam. Musculoskeletal: Unremarkable, except as noted in detailed exam.  Right hand exam: Skin inspection of the right hand again is notable for severe intrinsic muscle wasting of the hand, but otherwise is unremarkable. No swelling, erythema, ecchymosis, abrasions, or other skin abnormalities are identified. The wrist remains a radially deviated position, while the fingers tend to be maintained in a clawed position. He experiences difficulty extending his fingers, but is able to flex and extend his wrist, although motion again is limited due to weakness. His fingers exhibit full passive range of motion without triggering. When trying to correct his wrist to neutral position from the radially deviated position, there is clearly some radial sided tightness as well as a hint of spasticity inhibiting his ability to tolerate ulnar deviation. He demonstrates good capillary refill to all digits, but notes decreased sensation  to light touch to the ring and little fingers.  EMG results:  A recent EMG of the right upper extremity is available for review. 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 This report was reviewed by myself and discussed with the patient.  Assessment: Ulnar neuropathy of right upper extremity.   Plan: The treatment options were discussed with the patient. In addition, patient educational materials were provided regarding the diagnosis and treatment options. The patient is quite frustrated by his symptoms and functional limitations, and is ready to consider more aggressive treatment options. Therefore, I have recommended a surgical procedure, specifically a subcutaneous anterior transposition of the ulnar nerve at the right elbow. The procedure was discussed with the patient, as were the potential risks (including bleeding, infection, nerve and/or blood vessel injury, persistent or recurrent pain/paresthesias/weakness, need for further surgery, blood clots, strokes, heart attacks and/or arhythmias, pneumonia, etc.) and benefits. The patient states his understanding and wishes to proceed. All of the patient's questions and concerns were answered. He can call any time with further concerns. He will follow up post-surgery, routine.    H&P reviewed and patient re-examined. No changes.

## 2021-11-17 NOTE — Transfer of Care (Signed)
Immediate Anesthesia Transfer of Care Note  Patient: Scott Howell  Procedure(s) Performed: SUBCUTANEOUS TRANSPOSITION OF ULNAR NERVE, RIGHT ELBOW (Right: Elbow)  Patient Location: PACU  Anesthesia Type:General  Level of Consciousness: awake  Airway & Oxygen Therapy: Patient Spontanous Breathing  Post-op Assessment: Report given to RN and Post -op Vital signs reviewed and stable  Post vital signs: Reviewed and stable  Last Vitals:  Vitals Value Taken Time  BP 146/90 11/17/21 1421  Temp 35.9   Pulse 87 11/17/21 1423  Resp 17 11/17/21 1423  SpO2 99 % 11/17/21 1423  Vitals shown include unvalidated device data.  Last Pain:  Vitals:   11/17/21 1033  TempSrc: Temporal  PainSc: 0-No pain      Patients Stated Pain Goal: 0 (12/04/09 7356)  Complications: No notable events documented.

## 2021-11-17 NOTE — Anesthesia Procedure Notes (Signed)
Procedure Name: LMA Insertion Date/Time: 11/17/2021 1:10 PM  Performed by: Cammie Sickle, CRNAPre-anesthesia Checklist: Patient identified, Patient being monitored, Timeout performed, Emergency Drugs available and Suction available Patient Re-evaluated:Patient Re-evaluated prior to induction Oxygen Delivery Method: Circle system utilized Preoxygenation: Pre-oxygenation with 100% oxygen Induction Type: IV induction Ventilation: Mask ventilation without difficulty LMA: LMA inserted LMA Size: 4.0 Tube type: Oral Number of attempts: 1 Placement Confirmation: positive ETCO2 and breath sounds checked- equal and bilateral Tube secured with: Tape Dental Injury: Teeth and Oropharynx as per pre-operative assessment

## 2021-11-17 NOTE — Op Note (Signed)
11/17/2021  2:28 PM  Patient:   Scott Howell  Pre-Op Diagnosis:   Chronic right cubital tunnel syndrome.  Post-Op Diagnosis:   Same  Procedure:   Subcutaneous anterior transposition ulnar nerve, right elbow.  Surgeon:   Pascal Lux, MD  Assistant:   None  Anesthesia:   General LMA  Findings:   As above.  Complications:   None  EBL:   0 cc  Fluids:   300 cc crystalloid  TT:   50 minutes at 250 mmHg  Drains:   None  Closure:   Staples  Brief Clinical Note:   The patient is a 51 year old male with a long history of pain, paresthesias, and weakness to the ulnar side of his right hand. His symptoms have persisted despite medications, activity modification, etc. The patient's history and examination were consistent with cubital tunnel syndrome, confirmed by an EMG. The patient presents at this time for subcutaneous anterior transposition of the ulnar nerve at the right elbow.  Procedure:   The patient was brought into the operating room and lain in the supine position. After adequate general laryngal mask anesthesia was obtained, the patient's right upper extremity was prepped with ChloraPrep solution before being draped sterilely. Preoperative antibiotics were administered. After performing a timeout to verify the appropriate surgical site, the limb was exsanguinated with an Esmarch and the tourniquet inflated to 250 mmHg.   An approximately 7-8 cm curvilinear incision was made along the course of the ulnar nerve posterior to the medial epicondyle. The incision was carried down through the subcutaneous tissues with care taken to avoid the small branches of the medial antebrachial nerve to expose the sheath overlying the cubital tunnel. The ulnar nerve was identified at the proximal end of the tunnel and was dissected free. The nerve was then carefully followed as the roof of the cubital tunnel was released from proximal to distal. Distally, the fascia overlying the pronator  muscle was released for several centimeters. A vessel loop was passed around the nerve and used to provide gentle traction on the nerve while circumferential dissection was carried out under loupe magnification using bipolar electrocautery and tenotomy scissors.   Once the nerve was fully mobilized, the anterior tissues were elevated as a flap just superficial to the fascia overlying the common flexor origin and a pocket created to accept the nerve. Care was taken to be sure that there was no undue tension along the nerve either proximally or distally. The nerve was carefully retracted while several 2-0 Vicryl interrupted sutures were placed to reapproximate the flap to the medial epicondylar soft tissues, thereby creating a "sling" for the ulnar nerve. The cubital tunnel itself was reapproximated using several 2-0 Vicryl interrupted sutures in order to prevent the nerve from falling back into the cubital tunnel.   The wound was copiously irrigated with sterile saline solution before the subcutaneous tissues were closed using 2-0 Vicryl interrupted sutures. The skin was closed using staples. A total of 15 cc of 0.5% plain Sensorcaine was injected in and around the incision to help with postoperative analgesia. A sterile bulky dressing was applied to the arm before the patient was placed into a sling. The patient was then awakened, extubated, and returned to the recovery room in satisfactory condition after tolerating the procedure well.

## 2021-11-17 NOTE — Anesthesia Postprocedure Evaluation (Signed)
Anesthesia Post Note  Patient: Scott Howell  Procedure(s) Performed: SUBCUTANEOUS TRANSPOSITION OF ULNAR NERVE, RIGHT ELBOW (Right: Elbow)  Patient location during evaluation: PACU Anesthesia Type: General Level of consciousness: awake and alert Pain management: pain level controlled Vital Signs Assessment: post-procedure vital signs reviewed and stable Respiratory status: spontaneous breathing, nonlabored ventilation, respiratory function stable and patient connected to nasal cannula oxygen Cardiovascular status: blood pressure returned to baseline and stable Postop Assessment: no apparent nausea or vomiting Anesthetic complications: no   No notable events documented.   Last Vitals:  Vitals:   11/17/21 1430 11/17/21 1445  BP: 133/87 (!) 130/90  Pulse: 77 76  Resp: 19 15  Temp:    SpO2: 98% 97%    Last Pain:  Vitals:   11/17/21 1445  TempSrc:   PainSc: 5                  Precious Haws 

## 2021-11-17 NOTE — Discharge Instructions (Addendum)
Orthopedic discharge instructions: Keep dressing dry and intact. Keep hand elevated above heart level. May shower after dressing removed on postop day 4 (Sunday). Cover sutures/staples with Band-Aids after drying off. Apply ice to affected area frequently. Resume Celebrex 200 mg twice daily OR take ibuprofen 600-800 mg TID with meals for 7-10 days, then as necessary. Take pain medication as prescribed when needed.  Return for follow-up in 10-14 days or as scheduled.  AMBULATORY SURGERY  DISCHARGE INSTRUCTIONS   The drugs that you were given will stay in your system until tomorrow so for the next 24 hours you should not:  Drive an automobile Make any legal decisions Drink any alcoholic beverage   You may resume regular meals tomorrow.  Today it is better to start with liquids and gradually work up to solid foods.  You may eat anything you prefer, but it is better to start with liquids, then soup and crackers, and gradually work up to solid foods.   Please notify your doctor immediately if you have any unusual bleeding, trouble breathing, redness and pain at the surgery site, drainage, fever, or pain not relieved by medication.       Please contact your physician with any problems or Same Day Surgery at [phone], Monday through Friday 6 am to 4 pm, or [place] at [facility] number at [phone].

## 2021-11-18 ENCOUNTER — Encounter: Payer: Self-pay | Admitting: Surgery

## 2021-12-02 ENCOUNTER — Ambulatory Visit (INDEPENDENT_AMBULATORY_CARE_PROVIDER_SITE_OTHER): Payer: Medicaid Other | Admitting: Internal Medicine

## 2021-12-02 ENCOUNTER — Other Ambulatory Visit: Payer: Self-pay

## 2021-12-02 ENCOUNTER — Encounter: Payer: Self-pay | Admitting: Internal Medicine

## 2021-12-02 VITALS — BP 130/76 | HR 90 | Temp 98.0°F | Resp 18 | Ht 73.0 in | Wt 217.1 lb

## 2021-12-02 DIAGNOSIS — K219 Gastro-esophageal reflux disease without esophagitis: Secondary | ICD-10-CM

## 2021-12-02 DIAGNOSIS — J449 Chronic obstructive pulmonary disease, unspecified: Secondary | ICD-10-CM | POA: Diagnosis not present

## 2021-12-02 DIAGNOSIS — I10 Essential (primary) hypertension: Secondary | ICD-10-CM

## 2021-12-02 DIAGNOSIS — E7800 Pure hypercholesterolemia, unspecified: Secondary | ICD-10-CM

## 2021-12-02 DIAGNOSIS — Z23 Encounter for immunization: Secondary | ICD-10-CM | POA: Diagnosis not present

## 2021-12-02 MED ORDER — ATORVASTATIN CALCIUM 20 MG PO TABS: 20.0000 mg | ORAL_TABLET | Freq: Every evening | ORAL | 1 refills | Status: DC

## 2021-12-02 MED ORDER — FAMOTIDINE 20 MG PO TABS: 20.0000 mg | ORAL_TABLET | Freq: Two times a day (BID) | ORAL | 1 refills | Status: DC

## 2021-12-02 MED ORDER — IPRATROPIUM-ALBUTEROL 0.5-2.5 (3) MG/3ML IN SOLN: 3.0000 mL | Freq: Two times a day (BID) | RESPIRATORY_TRACT | Status: DC

## 2021-12-02 MED ORDER — ALBUTEROL SULFATE HFA 108 (90 BASE) MCG/ACT IN AERS: INHALATION_SPRAY | RESPIRATORY_TRACT | 2 refills | Status: DC

## 2021-12-02 MED ORDER — LISINOPRIL 10 MG PO TABS: 10.0000 mg | ORAL_TABLET | Freq: Every evening | ORAL | 1 refills | Status: DC

## 2021-12-02 MED ORDER — BUDESONIDE-FORMOTEROL FUMARATE 160-4.5 MCG/ACT IN AERO: 2.0000 | INHALATION_SPRAY | Freq: Two times a day (BID) | RESPIRATORY_TRACT | Status: DC

## 2021-12-02 NOTE — Progress Notes (Signed)
Established Patient Office Visit  Subjective:  Patient ID: Scott Howell, male    DOB: 05/05/1970  Age: 51 y.o. MRN: [id number]  CC:  Chief Complaint  Patient presents with   Hypertension   Medication Refill    HPI Scott Howell presents for follow up on chronic medical conditions. Doing well since COVID last month. Recently underwent surgical fixation for right ulnar neuropathy on 11/17/21. He is starting a rehabilitation program soon.   Seizures:  -Now following with Neurology, last seen on 09/28/21 -Currently on Depakote 250 mg BID and Seroquel 300 mg -No seizure like activity since last follow up   HLD: -Medications: Lipitor 20 mg  -Patient is compliant with above medications and reports no side effects.  -Last lipid panel: 11/22 Lipid Panel     Component Value Date/Time   CHOL 195 12/16/2020 1139   TRIG 157 (H) 12/16/2020 1139   HDL 32 (L) 12/16/2020 1139   CHOLHDL 6.1 (H) 12/16/2020 1139   VLDL 22 11/26/2020 0157   LDLCALC 134 (H) 12/16/2020 1139    COPD: -COPD status: stable -Current medications: Symbicort daily, Duonebs and Albuterol PRN -Satisfied with current treatment: yes -Oxygen use: no -Dyspnea frequency: shortness of breath occasionally, non-exertional  -Cough frequency: smoker's cough daily, white mucus -Rescue inhaler frequency: Currently using Albtuerol once-twice a day  -Limitation of activity:  sometimes -Productive cough: yes -Pneumovax: Up to date -Influenza: Up to Date   Bipolar: Not on any medications currently, had seen Psychiatry over telemedicine and was given Abilify but is not taking it. Currently on Seroquel.    Hypertension: -Medications: Lisinopril 10 mg -Checking BP at home (average): No -Denies any SOB, CP, vision changes, LE edema or symptoms of hypotension   GERD: -On Pepcid 20 mg daily, controlling symptoms   Health Maintenance: -Blood work UTD -Colon cancer screening: given Cologuard in the past but hasn't  completed it -Flu vaccine due   Past Medical History:  Diagnosis Date   Arthritis    Asthma    Bipolar 1 disorder ([place])    COPD (chronic obstructive pulmonary disease) ([place])    Drug abuse, opioid type ([place])    Dyspnea    Fibromyalgia    GERD (gastroesophageal reflux disease)    H/O ETOH abuse    Headache    High cholesterol    History of 2019 novel coronavirus disease (COVID-19) 11/01/2021   Homicidal ideation    Hypertension    Living accommodation issues    a.) as of 03/29/2021 --> living in camper   Marijuana use    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   Schizophrenia ([place])    Seizures ([place]) 11/2020   Stroke ([place])    no deficits   Tobacco use     Past Surgical History:  Procedure Laterality Date   ANTERIOR CERVICAL DECOMP/DISCECTOMY FUSION N/A 01/13/2021   Procedure: C3-5 ANTERIOR CERVICAL DECOMPRESSION/DISCECTOMY FUSION;  Surgeon: Meade Maw, MD;  Location: [facility] ORS;  Service: Neurosurgery;  Laterality: N/A;   CERVICAL SPINE SURGERY     4   CLAVICLE SURGERY     HARDWARE REMOVAL Left 04/20/2015   Procedure: HARDWARE REMOVAL left leg;  Surgeon: Earnestine Leys, MD;  Location: [facility] ORS;  Service: Orthopedics;  Laterality: Left;   KNEE SURGERY Left    NECK SURGERY     x6   SHOULDER SURGERY Left    TOTAL KNEE ARTHROPLASTY Left 04/12/2021   Procedure: TOTAL KNEE ARTHROPLASTY;  Surgeon: Lovell Sheehan,  MD;  Location: [facility] ORS;  Service: Orthopedics;  Laterality: Left;   ULNAR NERVE TRANSPOSITION Right 11/17/2021   Procedure: SUBCUTANEOUS TRANSPOSITION OF ULNAR NERVE, RIGHT ELBOW;  Surgeon: Corky Mull, MD;  Location: [facility] ORS;  Service: Orthopedics;  Laterality: Right;    Family History  Problem Relation Age of Onset   Diabetes Mother    Hyperlipidemia Mother    Hypertension Mother    Diabetes Maternal Aunt    Cancer Maternal Aunt    Hyperlipidemia Maternal Aunt    Hypertension Maternal Aunt    Diabetes Maternal Uncle    Cancer Maternal Uncle     Hyperlipidemia Maternal Uncle    Hypertension Maternal Uncle     Social History   Socioeconomic History   Marital status: Single    Spouse name: Not on file   Number of children: Not on file   Years of education: Not on file   Highest education level: Not on file  Occupational History   Not on file  Tobacco Use   Smoking status: Every Day    Packs/day: 1.50    Years: 30.00    Total pack years: 45.00    Types: Cigarettes   Smokeless tobacco: Never  Vaping Use   Vaping Use: Never used  Substance and Sexual Activity   Alcohol use: No    Comment: Sober for 10 yrs.   Drug use: Yes    Types: Marijuana   Sexual activity: Never  Other Topics Concern   Not on file  Social History Narrative   Not on file   Social Determinants of Health   Financial Resource Strain: Not on file  Food Insecurity: Not on file  Transportation Needs: Not on file  Physical Activity: Not on file  Stress: Not on file  Social Connections: Not on file  Intimate Partner Violence: Not on file    Outpatient Medications Prior to Visit  Medication Sig Dispense Refill   celecoxib (CELEBREX) 100 MG capsule Take 100 mg by mouth 2 (two) times daily.     divalproex (DEPAKOTE) 250 MG DR tablet Take 1 tablet (250 mg total) by mouth 3 (three) times daily. 90 tablet 1   methocarbamol (ROBAXIN-750) 750 MG tablet Take 2 tablets (1,500 mg total) by mouth 2 (two) times daily as needed for muscle spasms.     oxyCODONE (ROXICODONE) 5 MG immediate release tablet Take 1-2 tablets (5-10 mg total) by mouth every 4 (four) hours as needed for moderate pain or severe pain. 30 tablet 0   QUEtiapine (SEROQUEL) 100 MG tablet Take by mouth.     atorvastatin (LIPITOR) 20 MG tablet Take 1 tablet (20 mg total) by mouth at bedtime.     budesonide-formoterol (SYMBICORT) 160-4.5 MCG/ACT inhaler Inhale 2 puffs into the lungs 2 (two) times daily.     famotidine (PEPCID) 20 MG tablet Take 1 tablet (20 mg total) by mouth 2 (two) times  daily.     fluticasone (FLONASE) 50 MCG/ACT nasal spray Place 2 sprays into both nostrils daily. 16 g 6   ipratropium-albuterol (DUONEB) 0.5-2.5 (3) MG/3ML SOLN Take 3 mLs by nebulization 2 (two) times daily.     lisinopril (ZESTRIL) 10 MG tablet Take 1 tablet (10 mg total) by mouth at bedtime.     VENTOLIN HFA 108 (90 Base) MCG/ACT inhaler INHALE 2 PUFFS INTO LUNGS EVERY 6 HOURS AS NEEDED FOR WHEEZING 18 each 2   benzonatate (TESSALON) 100 MG capsule Take 1 capsule (100 mg total) by mouth 2 (two)  times daily as needed for cough. (Patient not taking: Reported on 12/02/2021) 20 capsule 0   No facility-administered medications prior to visit.    Allergies  Allergen Reactions   Acetaminophen Other (See Comments)    Pt states that it makes him hyper.  .     ROS Review of Systems  Constitutional:  Negative for chills and fever.  Eyes:  Negative for visual disturbance.  Respiratory:  Negative for shortness of breath and wheezing.   Cardiovascular:  Negative for chest pain and leg swelling.  Gastrointestinal:  Negative for abdominal pain.  Neurological:  Negative for dizziness, seizures and headaches.      Objective:    Physical Exam Constitutional:      Appearance: Normal appearance.  HENT:     Head: Normocephalic and atraumatic.  Eyes:     Conjunctiva/sclera: Conjunctivae normal.  Cardiovascular:     Rate and Rhythm: Normal rate and regular rhythm.  Pulmonary:     Effort: Pulmonary effort is normal.     Breath sounds: Normal breath sounds.  Skin:    General: Skin is warm and dry.  Neurological:     General: No focal deficit present.     Mental Status: He is alert. Mental status is at baseline.  Psychiatric:        Mood and Affect: Mood normal.        Behavior: Behavior normal.     BP 130/76   Pulse 90   Temp 98 F (36.7 C) (Oral)   Resp 18   Ht 6' 1"  (1.854 m)   Wt 217 lb 1.6 oz (98.5 kg)   SpO2 96%   BMI 28.64 kg/m  Wt Readings from Last 3 Encounters:   12/02/21 217 lb 1.6 oz (98.5 kg)  11/17/21 217 lb (98.4 kg)  11/01/21 214 lb 11.2 oz (97.4 kg)     Health Maintenance Due  Topic Date Due   COVID-19 Vaccine (1) Never done   Fecal DNA (Cologuard)  Never done    There are no preventive care reminders to display for this patient.  Lab Results  Component Value Date   TSH 0.70 12/16/2020   Lab Results  Component Value Date   WBC 9.5 11/12/2021   HGB 16.2 11/12/2021   HCT 47.3 11/12/2021   MCV 90.6 11/12/2021   PLT 338 11/12/2021   Lab Results  Component Value Date   NA 139 11/12/2021   K 4.0 11/12/2021   CO2 29 11/12/2021   GLUCOSE 93 11/12/2021   BUN 10 11/12/2021   CREATININE 0.80 11/12/2021   BILITOT 0.4 12/16/2020   ALKPHOS 49 11/25/2020   AST 23 12/16/2020   ALT 17 12/16/2020   PROT 7.0 12/16/2020   ALBUMIN 4.5 11/25/2020   CALCIUM 9.6 11/12/2021   ANIONGAP 5 11/12/2021   EGFR 109 12/16/2020   Lab Results  Component Value Date   CHOL 195 12/16/2020   Lab Results  Component Value Date   HDL 32 (L) 12/16/2020   Lab Results  Component Value Date   LDLCALC 134 (H) 12/16/2020   Lab Results  Component Value Date   TRIG 157 (H) 12/16/2020   Lab Results  Component Value Date   CHOLHDL 6.1 (H) 12/16/2020   Lab Results  Component Value Date   HGBA1C 5.8 (H) 11/26/2020      Assessment & Plan:   1. Hypertension goal BP (blood pressure) < 140/90: Chronic and stable.  Blood pressure at goal today.  Continue lisinopril 10  mg, refilled.  Follow-up in 6 months for recheck.  He will be due for annual labs at that time.  - lisinopril (ZESTRIL) 10 MG tablet; Take 1 tablet (10 mg total) by mouth at bedtime.  Dispense: 90 tablet; Refill: 1  2. Hypercholesteremia: Stable.  Doing well with this point refilled today.  - atorvastatin (LIPITOR) 20 MG tablet; Take 1 tablet (20 mg total) by mouth at bedtime.  Dispense: 90 tablet; Refill: 1  3. Chronic obstructive pulmonary disease, unspecified COPD type ([place]):  Overall doing well on his consistent with his regimen.  He has been out of his inhalers for the last week or so.  Refill Symbicort albuterol and DuoNebs to use as needed.  - budesonide-formoterol (SYMBICORT) 160-4.5 MCG/ACT inhaler; Inhale 2 puffs into the lungs 2 (two) times daily.  Dispense: 1 each - ipratropium-albuterol (DUONEB) 0.5-2.5 (3) MG/3ML SOLN; Take 3 mLs by nebulization 2 (two) times daily.  Dispense: 360 mL - albuterol (VENTOLIN HFA) 108 (90 Base) MCG/ACT inhaler; INHALE 2 PUFFS INTO LUNGS EVERY 6 HOURS AS NEEDED FOR WHEEZING  Dispense: 18 each; Refill: 2  4. GERD without esophagitis: Stable.  Symptoms well controlled.  Continue Pepcid 20 mg twice daily, refilled today.  - famotidine (PEPCID) 20 MG tablet; Take 1 tablet (20 mg total) by mouth 2 (two) times daily.  Dispense: 180 tablet; Refill: 1  5. Need for influenza vaccination: Flu vaccine administered.  - Flu Vaccine QUAD 6+ mos PF IM (Fluarix Quad PF)  Follow-up: Return in about 6 months (around 06/03/2022).    Teodora Medici, DO

## 2021-12-02 NOTE — Patient Instructions (Signed)
It was great seeing you today!  Plan discussed at today's visit: -Medications refilled today -Flu vaccine   Follow up in: 6 months   Take care and let us know if you have any questions or concerns prior to your next visit.  Dr. Rosana Berger

## 2022-01-11 ENCOUNTER — Ambulatory Visit: Payer: Medicaid Other | Attending: Surgery | Admitting: Occupational Therapy

## 2022-01-11 ENCOUNTER — Encounter: Payer: Self-pay | Admitting: Occupational Therapy

## 2022-01-11 DIAGNOSIS — M25631 Stiffness of right wrist, not elsewhere classified: Secondary | ICD-10-CM | POA: Insufficient documentation

## 2022-01-11 DIAGNOSIS — M6281 Muscle weakness (generalized): Secondary | ICD-10-CM | POA: Diagnosis present

## 2022-01-11 DIAGNOSIS — G5621 Lesion of ulnar nerve, right upper limb: Secondary | ICD-10-CM | POA: Insufficient documentation

## 2022-01-11 DIAGNOSIS — M25641 Stiffness of right hand, not elsewhere classified: Secondary | ICD-10-CM | POA: Insufficient documentation

## 2022-01-11 NOTE — Therapy (Signed)
[place] [facility] [address] [place], [place], [address] Phone: [phone]   Fax:  [phone]  Occupational Therapy Evaluation  Patient Details  Name: Scott Howell MRN: [id number] Date of Birth: 28-Apr-1970 Referring Provider (OT): Dr Joice Lofts   Encounter Date: 01/11/2022   OT End of Session - 01/11/22 1314     Visit Number 1    Number of Visits 16    Date for OT Re-Evaluation 03/22/22    OT Start Time 0901    OT Stop Time 0945    OT Time Calculation (min) 44 min    Activity Tolerance Patient tolerated treatment well    Behavior During Therapy [facility] for tasks assessed/performed             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   Schizophrenia (HCC)    Seizures (HCC) 11/2020   Stroke (HCC)    no deficits   Tobacco use     Past Surgical History:  Procedure Laterality Date   ANTERIOR CERVICAL DECOMP/DISCECTOMY FUSION N/A 01/13/2021   Procedure: C3-5 ANTERIOR CERVICAL DECOMPRESSION/DISCECTOMY FUSION;  Surgeon: Venetia Night, MD;  Location: [facility] ORS;  Service: Neurosurgery;  Laterality: N/A;   CERVICAL SPINE SURGERY     4   CLAVICLE SURGERY     HARDWARE REMOVAL Left 04/20/2015   Procedure: HARDWARE REMOVAL left leg;  Surgeon: Deeann Saint, MD;  Location: [facility];  Service: Orthopedics;  Laterality: Left;   KNEE SURGERY Left    NECK SURGERY     x6   SHOULDER SURGERY Left    TOTAL KNEE  ARTHROPLASTY Left 04/12/2021   Procedure: TOTAL KNEE ARTHROPLASTY;  Surgeon: Lyndle Herrlich, MD;  Location: [facility];  Service: Orthopedics;  Laterality: Left;   ULNAR NERVE TRANSPOSITION Right 11/17/2021   Procedure: SUBCUTANEOUS TRANSPOSITION OF ULNAR NERVE, RIGHT ELBOW;  Surgeon: Christena Flake, MD;  Location: [facility] OR[facility];  Service: Orthopedics;  Laterality: Right;    There were no vitals filed for this visit.   Subjective Assessment - 01/11/22 1306     Subjective  I hope you can help me.  I went to another therapy office but they referred me to you.  I had this problem since I had a seizure in November last year.  Then I had earlier this year neck and knee surgery. And then last month had this surgery for my elbow- but look I cannot use my hand in my work and look at the atrophy, wrist sideways and fingers cannot go straight - cannot use my thumb - and pain and spasms still in my arm when trying to do exercises    Pertinent History 11/28/21 seen by Ortho PA- Jose Persia. is a 51 y.o. who presents today that is post right ulnar nerve transposition performed on 11/17/2021.  Patient has discontinued wearing the sling and bracing that with applied at the time of surgery. He states he has had surgery on his neck and the sling was aggravating his neck. He has been working on range of motion of the elbow. His pleased with the results. Not a lot of change in regards to numbness weakness of the upper extremity. He is complaining of a lot of pain to the arm.  Refer to PT but because of athrophy in R UE and neuropathy refer to OT - pt had C3-5 ANTERIOR CERVICAL DECOMPRESSION/DISCECTOMY FUSION 01/13/2021 Dr. [facility] as well as earlier this year Knee surgery- in past 5 cervical surgeries    Patient Stated Goals I want to be able to use my R arm and hand again to do my job as Scientist, water qualitybrick mason, take care of myself    Currently in Pain? Yes    Pain Score 6     Pain Location Arm    Pain  Orientation Right    Pain Descriptors / Indicators Aching;Spasm;Tightness    Pain Type Surgical pain;Chronic pain;Acute pain    Pain Onset More than a month ago    Pain Frequency Intermittent               [facility] OT Assessment - 01/11/22 0001       Assessment   Medical Diagnosis R ulnar N transposition, athrophy in R UE , neuropathy    Referring Provider (OT) Dr [name]    Onset Date/Surgical Date 11/17/21    Hand Dominance Right    Next MD Visit dec      Home  Environment   Lives With Alone      Prior Function   Leisure Work as Scientist, water qualitybrick mason,      AROM   Overall AROM Comments pain shoulder ABD to 90    Right Elbow Flexion 145    Right Elbow Extension -10   tightness nad pain end range   Right Forearm Pronation 90 Degrees   pain 4/10   Right Forearm Supination 90 Degrees    Right Wrist Extension 60 Degrees    Right Wrist Flexion 40 Degrees    Right Wrist Radial Deviation --   keep in 30 RD   Right Wrist Ulnar Deviation --   pain and cannot do AROM UD     Strength   Right Hand Grip (lbs) 25    Right Hand Lateral Pinch 5 lbs    Right Hand 3 Point Pinch 3 lbs   2point  cannot do 3 point   Left Hand Grip (lbs) 105    Left Hand Lateral Pinch 21 lbs    Left Hand 3 Point Pinch 8 lbs   2 point pinch     Right Hand AROM   R Thumb MCP 0-60 --   tight in flexion   R Thumb IP 0-80 --   tight in flexion   R Thumb Radial ABduction/ADduction 0-55 38    R Thumb Palmar ABduction/ADduction 0-45 45    R Ring PIP 0-100 --   -40 ext   R Little PIP 0-100 --   -60 ext                             OT Education - 01/11/22 1313     Education Details Findings of evaluation and home program and splint wearing her palpation    Person(s) Educated Patient  Methods Explanation;Demonstration;Tactile cues;Verbal cues;Handout    Comprehension Verbal cues required;Returned demonstration;Verbalized understanding              OT LONG Term Goals - 01/11/22 2019        OT LT TERM GOAL #1   Title Pt to be ind in use of R wrist splint and WB HEP to increase UD and wrist ext with digits extention    Baseline Kept wrist in 30 degrees of RD, pain and tightness , wrist drops into flexion with attmpts of digits extention - tightness mostly in flexors of hand and forearm    Time 3    Period Weeks    Status New    Target Date 02/01/22      OT LT TERM GOAL #2   Title R thumb AROM increase in all planes for pt to hold cup, pick up 1 cm objects and use utencils    Baseline R thumb flexion decrease - tightness in extensors- decrease PA  45 and RA  38- not able to use hand functional in ADL's    Time 5    Period Weeks    Status New    Target Date 02/15/22      OT LT TERM GOAL #3   Title R wrist  motion increase in all ranges including sup with pain less than 2/10 to turn doorknob, stabilize wrist neutral during gripping and lifting of objects    Baseline supination pain 4/10 ; wrist flex during attempts of gripping and during lifting or extension of digits - wrist drop into flexion    Time 6    Period Weeks    Status New    Target Date 02/22/22      OT LT  TERM GOAL #4   Title R shoulder and elbow AROM increase to [facility] for pt use UE in ADL's with symptoms less than 3/10    Baseline spasm in arm with ABd shoulder to 90 , same with elbow extention in supination - end range limited ext -10 and pain increase to 6-8/10    Time 8    Period Weeks    Status New    Target Date 03/08/22              Review with pt HEP  Elbow pad fabricated for night time to decrease flexion causing compression Wrist prefab splint to use during day to decrease RD and flexion tightness - sleep too with it  WB inbetween all exercises to decrease flexor tightness -no pushing - just light WB on chair AAROM UD 8 reps  AAROM and PROM wrist flexion and ext open hand 8 reps  Rolling hand over red foam roller for soft tissue mobs and ext of digits 2 x 8reps  Block wrist and hand during  lumbrical fist 2 x 6 reps  And then wrist splint on during intrinsic fist with AAROM for digits extention and fisting with AAROM for digits extention   WB again reinforce in between each exercise for 1 min  Thumb PA and RA 2x 8reps   2-3 x day after moist heat  Showed good progress during session in motion and less spasm and pain          Plan - 01/11/22 1316     Clinical Impression Statement Pt present 7 1/2 wks s/p R ulnar N transposition- but pt with hx of 5 cervical surgeries with last one in Nov 22 after having seizures for few days in  Nov 22 prior. Pt with significant atrophy and tightness in R dominant arm. Pt shoulder ABD to 90 and pain 6/10. Shoulder flexion WNL ext -10 and pain with end range. Wrist kept in 30 degrees of RD and thumb ADD- pain and tightness with attempts of PROM UD and thumb flexion. Flexion 40 and 60 extention. Pain with supination- unable to extend digits without flexion of wrist during attempt but PIP flexion tightness in 4th and 5th digits. Tinel at ulnar wrist reported. Pt very motivated in session to improve his ROM , strenght and use of R dominant UE since his seizures in Nov followed by cervical surgery and now 11/17/21 R ulnar N transpostion- pt limited in use of R dominant UE in ADL's and IADL's - pt can benefit from skiled OT services to increase ROM, strenght and function    OT Occupational Profile and History Problem Focused Assessment - Including review of records relating to presenting problem    Occupational performance deficits (Please refer to evaluation for details): ADL's;IADL's;Rest and Sleep;Play;Work;Leisure;Social Participation    Body Structure / Function / Physical Skills ADL;Coordination;Dexterity;Decreased knowledge of use of DME;Flexibility;ROM;UE functional use;Scar mobility;Muscle spasms;Sensation;Pain;Strength;IADL    Rehab Potential Fair    Clinical Decision Making Limited treatment options, no task modification necessary     Comorbidities Affecting Occupational Performance: May have comorbidities impacting occupational performance   5 cervical surgeries in past- Nov 22 cervical surgery   Modification or Assistance to Complete Evaluation  No modification of tasks or assist necessary to complete eval    OT Frequency 2x / week    OT Duration --   10 wks   OT Treatment/Interventions Self-care/ADL training;Moist Heat;Therapeutic exercise;Patient/family education;Splinting;Scar mobilization;DME and/or AE instruction;Manual Therapy;Passive range of motion    Consulted and Agree with Plan of Care Patient             Patient will benefit from skilled therapeutic intervention in order to improve the following deficits and impairments:   Body Structure / Function / Physical Skills: ADL, Coordination, Dexterity, Decreased knowledge of use of DME, Flexibility, ROM, UE functional use, Scar mobility, Muscle spasms, Sensation, Pain, Strength, IADL       Visit Diagnosis: Stiffness of right hand, not elsewhere classified  Stiffness of right wrist, not elsewhere classified  Muscle weakness (generalized)  Ulnar neuropathy at elbow of right upper extremity    Problem List Patient Active Problem List   Diagnosis Date Noted   S/P TKR (total knee replacement) using cement, left 04/12/2021   Cervical myelopathy (HCC) 01/13/2021   Seizure (HCC) 11/26/2020   Abnormal thyroid blood test 04/06/2016   Encounter for screening for HIV 01/05/2016   Abnormal weight gain 01/05/2016   Chronic prescription benzodiazepine use 10/19/2015   Elevated liver enzymes 08/20/2015   Medication monitoring encounter 08/20/2015   Hypokalemia 08/20/2015   Erectile dysfunction 04/22/2015   Status post hardware removal 04/20/2015   Bipolar 2 disorder (HCC) 07/29/2014   Marijuana abuse 07/29/2014   Chronic knee pain 07/29/2014   Bipolar 1 disorder, mixed (HCC)    Drug abuse, opioid type (HCC) 06/28/2013   GERD without esophagitis 10/03/2012    Hypertension goal BP (blood pressure) < 140/90 10/03/2012   Chronic pain associated with significant psychosocial dysfunction 07/17/2012   Lumbar canal stenosis 04/09/2012   Hypercholesterolemia 01/06/2012   CN (constipation) 11/11/2011   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, [place], [address] Phone: [phone]   Fax:  [phone]  Name: DEONDRA WIGGER MRN: [id number] Date of Birth: October 27, 1970

## 2022-01-12 ENCOUNTER — Telehealth: Payer: Self-pay | Admitting: Internal Medicine

## 2022-01-12 DIAGNOSIS — E7800 Pure hypercholesterolemia, unspecified: Secondary | ICD-10-CM

## 2022-01-12 NOTE — Telephone Encounter (Signed)
Unable to refill per protocol, request is too soon. Last refill 12/02/21 for 90. Will refuse.  Requested Prescriptions  Pending Prescriptions Disp Refills   atorvastatin (LIPITOR) 20 MG tablet [Pharmacy Med Name: ATORVASTATIN 20 MG TABLET] 30 tablet 8    Sig: TAKE 1 TABLET BY MOUTH EVERY DAY     Cardiovascular:  Antilipid - Statins Failed - 01/12/2022 11:19 AM      Failed - Lipid Panel in normal range within the last 12 months    Cholesterol  Date Value Ref Range Status  12/16/2020 195 <200 mg/dL Final   LDL Cholesterol (Calc)  Date Value Ref Range Status  12/16/2020 134 (H) mg/dL (calc) Final    Comment:    Reference range: <100 . Desirable range <100 mg/dL for primary prevention;   <70 mg/dL for patients with CHD or diabetic patients  with > or = 2 CHD risk factors. Marland Kitchen LDL-C is now calculated using the Martin-Hopkins  calculation, which is a validated novel method providing  better accuracy than the Friedewald equation in the  estimation of LDL-C.  Horald Pollen et al. Lenox Ahr. 1751;025(85): 2061-2068  (http://education.[employer].com/faq/FAQ164)    HDL  Date Value Ref Range Status  12/16/2020 32 (L) > OR = 40 mg/dL Final   Triglycerides  Date Value Ref Range Status  12/16/2020 157 (H) <150 mg/dL Final         Passed - Patient is not pregnant      Passed - Valid encounter within last 12 months    Recent Outpatient Visits           1 month ago Hypertension goal BP (blood pressure) < 140/90   [facility] Margarita Mail, DO   2 months ago COVID-19   [facility] Margarita Mail, DO   6 months ago Trauma   [facility] Margarita Mail, DO   8 months ago Hypertension goal BP (blood pressure) < 140/90   [facility] Margarita Mail, DO   11 months ago Hypertension goal BP (blood pressure) < 140/90   [facility] Margarita Mail, DO       Future  Appointments             In 4 months Margarita Mail, DO [facility], [facility]

## 2022-01-13 ENCOUNTER — Ambulatory Visit: Payer: Medicaid Other | Admitting: Occupational Therapy

## 2022-01-13 DIAGNOSIS — G5621 Lesion of ulnar nerve, right upper limb: Secondary | ICD-10-CM

## 2022-01-13 DIAGNOSIS — M25631 Stiffness of right wrist, not elsewhere classified: Secondary | ICD-10-CM | POA: Diagnosis present

## 2022-01-13 DIAGNOSIS — M25641 Stiffness of right hand, not elsewhere classified: Secondary | ICD-10-CM | POA: Diagnosis not present

## 2022-01-13 DIAGNOSIS — M6281 Muscle weakness (generalized): Secondary | ICD-10-CM | POA: Diagnosis present

## 2022-01-13 NOTE — Therapy (Signed)
[place] [facility] [address] [place], [place], [address] Phone: [phone]   Fax:  [phone]  Occupational Therapy Treatment  Patient Details  Name: Scott Howell MRN: [id number] Date of Birth: 1970-11-21 Referring Provider (OT): Dr [name]   Encounter Date: 01/13/2022   OT End of Session - 01/13/22 1118     Visit Number 2    Number of Visits 16    Date for OT Re-Evaluation 03/22/22    OT Start Time 0901    OT Stop Time 0940    OT Time Calculation (min) 39 min    Activity Tolerance Patient tolerated treatment well    Behavior [facility] tasks assessed/performed             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   Schizophrenia (HCC)    Seizures (HCC) 11/2020   Stroke (HCC)    no deficits   Tobacco use     Past Surgical History:  Procedure Laterality Date   ANTERIOR CERVICAL DECOMP/DISCECTOMY FUSION N/A 01/13/2021   Procedure: C3-5 ANTERIOR CERVICAL DECOMPRESSION/DISCECTOMY FUSION;  Surgeon: Scott Howell, [name], MD;  Location: [facility] ORS;  Service: Neurosurgery;  Laterality: N/A;   CERVICAL SPINE SURGERY     4   CLAVICLE SURGERY     HARDWARE REMOVAL Left 04/20/2015   Procedure: HARDWARE REMOVAL left leg;  Surgeon: Scott SaintHoward Miller, MD;  Location: [facility] OR[facility];  Service: Orthopedics;  Laterality: Left;   KNEE SURGERY Left    NECK SURGERY     x6   SHOULDER SURGERY Left    TOTAL KNEE  ARTHROPLASTY Left 04/12/2021   Procedure: TOTAL KNEE ARTHROPLASTY;  Surgeon: Scott Howell, Scott R, MD;  Location: [facility] OR[facility];  Service: Orthopedics;  Laterality: Left;   ULNAR NERVE TRANSPOSITION Right 11/17/2021   Procedure: SUBCUTANEOUS TRANSPOSITION OF ULNAR NERVE, RIGHT ELBOW;  Surgeon: Scott Howell, Scott J, MD;  Location: [facility];  Service: Orthopedics;  Laterality: Right;    There were no vitals filed for this visit.   Subjective Assessment - 01/13/22 1117     Subjective  I done the exercises twice yesterday but not this morning yet.  I try to do them and stop before it spasm.  I have a friend that helps him with the exercises because I cannot read    Pertinent History 11/28/21 seen by Ortho PA- Scott Persiaobert E Nishi Jr. is a [age] y.o. who presents today that is post right ulnar nerve transposition performed on 11/17/2021. Patient has discontinued wearing the sling and bracing that with applied at the time of surgery. He states he has had surgery on his neck and the sling was aggravating his neck. He has been working on range of motion of the elbow. His pleased with the results. Not a lot of change in regards to numbness weakness  of the upper extremity. He is complaining of a lot of pain to the arm.  Refer to PT but because of athrophy in Howell UE and neuropathy refer to OT - pt had C3-5 ANTERIOR CERVICAL DECOMPRESSION/DISCECTOMY FUSION 01/13/2021 Dr. 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 No/denies            Patient coming in today reporting that he cannot read-just a little-a friend helped him but if I can review some of the home program again. He done it twice yesterday but not this morning yet. Assess patient's shoulder and elbow active range of motion and add to home program able to stay to do shoulder  abduction with less pain 8 repetitions Shoulder flexion 10 reps Elbow flexion 3 positions 5 reps each Tolerated better with less pain this date.    Review with pt HEP  Patient able to sleep better and extending elbow more by using Tubigrip for compression of the elbow and wrist brace on at Howell. Wrist prefab splint to decrease RD and flexion tightness - sleep too with it  Patient needed max assist and verbal cueing to review home exercises-reinforce WB inbetween all exercises -did not do that causing less reps and sets able to do at home.  Reviewed with patient again that weightbearing decrease flexor tightness prior to his motion exercises-no pushing - just light WB on chair AAROM UD 8 reps  AAROM and PROM wrist flexion and ext open hand 8 reps   Rolling hand over red foam roller for soft tissue mobs and ext of digits 2 x 8reps  Block wrist and hand during lumbrical fist 2 x 6 reps  And then wrist splint on during intrinsic fist in composite fist with AAROM for digits extention and fisting with AAROM for digits extention    WB again reinforce in between each exercise for 1 min  Thumb PA and RA 2x 8reps    2-3 x day after moist heat  Showed good progress during session in motion and less spasm and pain  Patient showing increase extension at the second and third digit but some clawing at fifth with some at fourth. Will assess to fabricate  ulnar N anti claw splint for fourth and fifth in the next 1 or 2 sessions.                  OT Education - 01/13/22 1118     Education Details Reviewed home exercises again with patient and add some new ones.    Person(s) Educated Patient    Methods Explanation;Demonstration;Tactile cues;Verbal cues;Handout    Comprehension Verbal cues required;Returned demonstration;Verbalized understanding              OT Short Term Goals - 01/11/22 2019       OT SHORT TERM GOAL #1   Title Pt to be ind in use of Howell wrist splint and WB HEP  to increase UD and wrist ext with digits extention    Baseline Kept wrist in 30 degrees of RD, pain and tightness , wrist drops into flexion with attmpts of digits extention - tightness mostly in flexors of hand and forearm    Time 3    Period Weeks  Status New    Target Date 02/01/22      OT SHORT TERM GOAL #2   Title Howell thumb AROM increase in all planes for pt to hold cup, pick up 1 cm objects and use utencils    Baseline Howell thumb flexion decrease - tightness in extensors- decrease PA  45 and RA  38- not able to use hand functional in ADL's    Time 5    Period Weeks    Status New    Target Date 02/15/22      OT SHORT TERM GOAL #3   Title Howell wrist  motion increase in all ranges including sup with pain less than 2/10 to turn doorknob, stabilize wrist neutral during gripping and lifting of objects    Baseline supination pain 4/10 ; wrist flex during attempts of gripping and during lifting or extension of digits - wrist drop into flexion    Time 6    Period Weeks    Status New    Target Date 02/22/22      OT SHORT TERM GOAL #4   Title Howell shoulder and elbow AROM increase to [facility] for pt use UE in ADL's with symptoms less than 3/10    Baseline spasm in arm with ABd shoulder to 90 , same with elbow extention in supination - end range limited ext -10 and pain increase to 6-8/10    Time 8    Period Weeks    Status New    Target Date 03/08/22                      Plan - 01/13/22 1119     Clinical Impression Statement Pt present 8 wks s/p Howell ulnar N transposition- but pt with hx of 5 cervical surgeries with last one in Nov 22 after having seizures for few days in Nov 22 prior. Pt with significant atrophy and tightness in Howell dominant arm. Pt shoulder ABD to 90 and pain 6/10. Shoulder flexion WNL ext -10 and pain with end range. Wrist kept in 30 degrees of RD and thumb ADD- pain and tightness with attempts of PROM UD and thumb flexion. Flexion 40 and 60 extention. Pain with supination-  unable to extend digits without flexion of wrist during attempt but PIP flexion tightness in 4th and 5th digits. Tinel at ulnar wrist reported. Pt  cont to be very motivated in session to improve his ROM , strenght and use of Howell dominant UE since his seizures in Nov followed by cervical surgery and now 11/17/21 Howell ulnar N transpostion. Reviewed wiht pt again HEP - stop before spasm- and weight bearing to be done inbetwee. Add shoulder and elbow AROM - tolerated better with less pain today - pt limited in use of Howell dominant UE in ADL's and IADL's - pt can benefit from skiled OT services to increase ROM, strenght and function    OT Occupational Profile and History Problem Focused Assessment - Including review of records relating to presenting problem    Occupational performance deficits (Please refer to evaluation for details): ADL's;IADL's;Rest and Sleep;Play;Work;Leisure;Social Participation    Body Structure / Function / Physical Skills ADL;Coordination;Dexterity;Decreased knowledge of use of DME;Flexibility;ROM;UE functional use;Scar mobility;Muscle spasms;Sensation;Pain;Strength;IADL    Rehab Potential Fair    Clinical Decision Making Limited treatment options, no task modification necessary    Comorbidities Affecting Occupational Performance: May have comorbidities impacting occupational performance    Modification or Assistance to Complete Evaluation  No modification of  tasks or assist necessary to complete eval    OT Frequency 2x / week    OT Duration --   10 wks   OT Treatment/Interventions Self-care/ADL training;Moist Heat;Therapeutic exercise;Patient/family education;Splinting;Scar mobilization;DME and/or AE instruction;Manual Therapy;Passive range of motion    Consulted and Agree with Plan of Care Patient             Patient will benefit from skilled therapeutic intervention in order to improve the following deficits and impairments:   Body Structure / Function / Physical Skills: ADL,  Coordination, Dexterity, Decreased knowledge of use of DME, Flexibility, ROM, UE functional use, Scar mobility, Muscle spasms, Sensation, Pain, Strength, IADL       Visit Diagnosis: Stiffness of right hand, not elsewhere classified  Stiffness of right wrist, not elsewhere classified  Muscle weakness (generalized)  Ulnar neuropathy at elbow of right upper extremity    Problem List Patient Active Problem List   Diagnosis Date Noted   S/P TKR (total knee replacement) using cement, left 04/12/2021   Cervical myelopathy (HCC) 01/13/2021   Seizure (HCC) 11/26/2020   Abnormal thyroid blood test 04/06/2016   Encounter for screening for HIV 01/05/2016   Abnormal weight gain 01/05/2016   Chronic prescription benzodiazepine use 10/19/2015   Elevated liver enzymes 08/20/2015   Medication monitoring encounter 08/20/2015   Hypokalemia 08/20/2015   Erectile dysfunction 04/22/2015   Status post hardware removal 04/20/2015   Bipolar 2 disorder (HCC) 07/29/2014   Marijuana abuse 07/29/2014   Chronic knee pain 07/29/2014   Bipolar 1 disorder, mixed (HCC)    Drug abuse, opioid type (HCC) 06/28/2013   GERD without esophagitis 10/03/2012   Hypertension goal BP (blood pressure) < 140/90 10/03/2012   Chronic pain associated with significant psychosocial dysfunction 07/17/2012   Lumbar canal stenosis 04/09/2012   Hypercholesterolemia 01/06/2012   CN (constipation) 11/11/2011   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/2010   Decreased motor strength 09/30/2010   Current tobacco use 09/30/2010   H/O transient cerebral ischemia 09/09/2010   LBP (low back pain) 08/30/2010   Cervical spinal cord compression (HCC) 07/22/2010   Cervical post-laminectomy syndrome 07/22/2010   Chronic obstructive pulmonary disease (HCC) 02/23/2010    Scott Howell, OTR/L,CLT 01/13/2022, 11:21 AM  [place] [facility] [address] [place],  [address] Phone: [phone]   Fax:  [phone]  Name: Scott Howell MRN: [id number] Date of Birth: 07-27-1970

## 2022-01-17 ENCOUNTER — Ambulatory Visit: Payer: Medicaid Other | Attending: Surgery | Admitting: Occupational Therapy

## 2022-01-17 DIAGNOSIS — G5621 Lesion of ulnar nerve, right upper limb: Secondary | ICD-10-CM

## 2022-01-17 DIAGNOSIS — M25631 Stiffness of right wrist, not elsewhere classified: Secondary | ICD-10-CM

## 2022-01-17 DIAGNOSIS — M25641 Stiffness of right hand, not elsewhere classified: Secondary | ICD-10-CM | POA: Diagnosis present

## 2022-01-17 DIAGNOSIS — M6281 Muscle weakness (generalized): Secondary | ICD-10-CM | POA: Diagnosis present

## 2022-01-19 ENCOUNTER — Encounter: Payer: Self-pay | Admitting: Occupational Therapy

## 2022-01-19 NOTE — Therapy (Signed)
[facility] [address] [place], [place], [id number] Phone: [phone]   Fax:  [phone]  Occupational Therapy Treatment  Patient Details  Name: Scott LoryRobert E Howell MRN: [id number] Date of Birth: 1970/10/25 Referring Provider (OT): Dr [name]   Encounter Date: 01/17/2022   OT End of Session - 01/19/22 1518     Visit Number 3    Number of Visits 16    Date for OT Re-Evaluation 03/22/22    OT Start Time 0815    OT Stop Time 0900    OT Time Calculation (min) 45 min    Activity Tolerance Patient tolerated treatment well    Behavior During Therapy [facility] for tasks assessed/performed             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   Schizophrenia (HCC)    Seizures (HCC) 11/2020   Stroke (HCC)    no deficits   Tobacco use     Past Surgical History:  Procedure Laterality Date   ANTERIOR CERVICAL DECOMP/DISCECTOMY FUSION N/A 01/13/2021   Procedure: C3-5 ANTERIOR CERVICAL DECOMPRESSION/DISCECTOMY FUSION;  Surgeon: Venetia NightYarbrough, [name], MD;  Location: [facility] ORS;  Service: Neurosurgery;  Laterality: N/A;   CERVICAL SPINE SURGERY     4   CLAVICLE SURGERY     HARDWARE REMOVAL Left 04/20/2015   Procedure: HARDWARE REMOVAL left leg;  Surgeon: Deeann SaintHoward Miller, MD;  Location: [facility] ORS;  Service: Orthopedics;  Laterality: Left;   KNEE SURGERY Left    NECK SURGERY     x6   SHOULDER SURGERY Left    TOTAL KNEE ARTHROPLASTY  Left 04/12/2021   Procedure: TOTAL KNEE ARTHROPLASTY;  Surgeon: Lyndle HerrlichBowers, James R, MD;  Location: [facility] OR[facility];  Service: Orthopedics;  Laterality: Left;   ULNAR NERVE TRANSPOSITION Right 11/17/2021   Procedure: SUBCUTANEOUS TRANSPOSITION OF ULNAR NERVE, RIGHT ELBOW;  Surgeon: Christena FlakePoggi, John J, MD;  Location: [facility] OR[facility];  Service: Orthopedics;  Laterality: Right;    There were no vitals filed for this visit.   Subjective Assessment - 01/19/22 1516     Subjective  Pt reports pain in 5/10 in hand, fingers today.  He takes his pain medication at night, "I am cold this morning and that makes the pain worse." Pt has a MD appt with Dr. [name] this morning.  Reports he has been doing exercises at home, increased to 3 times a day and pain has increased, will plan to do 2 times a day and see if pain improves.  Pt reports he uses a moist towel at home and heats it up.    Pertinent History 11/28/21 seen by Ortho PA- Scott Howell. is a 51 y.o. who presents today that is post right ulnar nerve transposition performed on 11/17/2021. Patient has discontinued wearing the sling and bracing that with applied  at the time of surgery. He states he has had surgery on his neck and the sling was aggravating his neck. He has been working on range of motion of the elbow. His pleased with the results. Not a lot of change in regards to numbness weakness of the upper extremity. He is complaining of a lot of pain to the arm.  Refer to PT but because of athrophy in R UE and neuropathy refer to OT - pt had C3-5 ANTERIOR CERVICAL DECOMPRESSION/DISCECTOMY FUSION 01/13/2021 Dr. 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 Yes    Pain Score 5     Pain Location Arm    Pain Orientation Right    Pain Descriptors / Indicators Aching;Spasm;Tightness     Pain Type Surgical pain;Acute pain;Chronic pain    Pain Onset More than a month ago    Pain Frequency Intermittent             Therapist added shoulder and elbow active range of motion last session to home program, shoulder abduction with less pain 8 repetitions Shoulder flexion 10 reps Elbow flexion 3 positions 5 reps each Decreased pain this date and reports his shoulder is feeling better.   Moist heat to right elbow, forearm, wrist and hand for 5 mins prior to therapeutic exercises to decrease pain, increase motion.   Pt reports he is still having a hard time understanding his HEP, therapist reinstructed patient on HEP with written handouts and during session. Pt required min assist this date with instruction and demonstration.  He demonstrates greater understanding of weight bearing in between exercises to reinforce extension and decrease flexor tightness. Weight bearing is light stretch, no pushing.  Issued additional Tubigrip for compression of the elbow and wrist brace on at night, he reports he forgot to take it at his last session.  Pt has Wrist prefab splint to decrease RD and flexion tightness - sleep too with it   AAROM UD 8 reps  AAROM and PROM wrist flexion and ext open hand 8 reps   Rolling hand over red foam roller for soft tissue mobs and ext of digits 2 x 8reps  Block wrist and hand during lumbrical fist 2 x 6 reps Wrist splint on during intrinsic fist in composite fist with AAROM for digits extention and fisting with AAROM for digits extention    WB again reinforce in between each exercise for 1 min  Thumb PA and RA 2x 8reps    Pt to perform HEP 2-3 x day after moist heat   Patient showing increase extension at the second and third digit but some evidence of clawing at fifth with some at fourth in last session.  Decreased clawing this date.Will  continue to assess and potentially fabricate  ulnar N anti claw splint for fourth and fifth in the next 1 or 2  sessions if needed.  Hyperextension in right ring at DIP today      OT Education - 01/19/22 1517     Education Details HEP with handout    Person(s) Educated Patient    Methods Explanation;Demonstration;Tactile cues;Verbal cues;Handout    Comprehension Verbal cues required;Returned demonstration;Verbalized understanding              OT  Short Term Goals - 01/11/22 2019       OT SHORT TERM GOAL #1   Title Pt to be ind in use of R wrist splint and WB HEP to increase UD and wrist ext with digits extention    Baseline Kept wrist in 30 degrees of RD, pain and tightness , wrist drops into flexion with attmpts of digits extention - tightness mostly in flexors of hand and forearm    Time 3    Period Weeks    Status New    Target Date 02/01/22      OT SHORT TERM GOAL #2   Title R thumb AROM increase in all planes for pt to hold cup, pick up 1 cm objects and use utencils    Baseline R thumb flexion decrease - tightness in extensors- decrease PA  45 and RA  38- not able to use hand functional in ADL's    Time 5    Period Weeks    Status New    Target Date 02/15/22      OT SHORT TERM GOAL #3   Title R wrist  motion increase in all ranges including sup with pain less than 2/10 to turn doorknob, stabilize wrist neutral during gripping and lifting of objects    Baseline supination pain 4/10 ; wrist flex during attempts of gripping and during lifting or extension of digits - wrist drop into flexion    Time 6    Period Weeks    Status New    Target Date 02/22/22      OT SHORT TERM GOAL #4   Title R shoulder and elbow AROM increase to [facility] for pt use UE in ADL's with symptoms less than 3/10    Baseline spasm in arm with ABd shoulder to 90 , same with elbow extention in supination - end range limited ext -10 and pain increase to 6-8/10    Time 8    Period Weeks    Status New    Target Date 03/08/22                      Plan - 01/19/22 1518     Clinical Impression  Statement Pt present 8 wks s/p R ulnar N transposition- but pt with hx of 5 cervical surgeries with last one in Nov 22 after having seizures for few days in Nov 22 prior. Pt with significant atrophy and tightness in R dominant arm. Pt shoulder ABD to 90 and pain 6/10. Shoulder flexion WNL ext -10 and pain with end range. Wrist kept in 30 degrees of RD and thumb ADD- pain and tightness with attempts of PROM UD and thumb flexion. Flexion 40 and 60 extention. Pain with supination- unable to extend digits without flexion of wrist during attempt but PIP flexion tightness in 4th and 5th digits. Tinel at ulnar wrist reported. Pt  cont to be very motivated in session to improve his ROM , strength and use of R dominant UE since his seizures in Nov followed by cervical surgery and now 11/17/21 R ulnar N transpostion. Reviewed and reinstructed on HEP this date and instructed to stop before spasm- and weight bearing to be done inbetween each exercise. Addition of shoulder and elbow AROM last session and pt continues to improve.  Decreased evidence of clawing of ring and small fingers this date but does demonstrate hyperextension of DIP on right ring finger.  Will continue to monitor and assess need for additional splinting  intervention. Pt remains limited in use of R dominant UE in ADL's and IADL's and continues to benefit from skiled OT services to increase ROM, strength and function    OT Occupational Profile and History Problem Focused Assessment - Including review of records relating to presenting problem    Occupational performance deficits (Please refer to evaluation for details): ADL's;IADL's;Rest and Sleep;Play;Work;Leisure;Social Participation    Body Structure / Function / Physical Skills ADL;Coordination;Dexterity;Decreased knowledge of use of DME;Flexibility;ROM;UE functional use;Scar mobility;Muscle spasms;Sensation;Pain;Strength;IADL    Rehab Potential Fair    Clinical Decision Making Limited treatment  options, no task modification necessary    Comorbidities Affecting Occupational Performance: May have comorbidities impacting occupational performance    Modification or Assistance to Complete Evaluation  No modification of tasks or assist necessary to complete eval    OT Frequency 2x / week    OT Duration --   10 wks   OT Treatment/Interventions Self-care/ADL training;Moist Heat;Therapeutic exercise;Patient/family education;Splinting;Scar mobilization;DME and/or AE instruction;Manual Therapy;Passive range of motion    Consulted and Agree with Plan of Care Patient             Patient will benefit from skilled therapeutic intervention in order to improve the following deficits and impairments:   Body Structure / Function / Physical Skills: ADL, Coordination, Dexterity, Decreased knowledge of use of DME, Flexibility, ROM, UE functional use, Scar mobility, Muscle spasms, Sensation, Pain, Strength, IADL       Visit Diagnosis: Stiffness of right hand, not elsewhere classified  Stiffness of right wrist, not elsewhere classified  Muscle weakness (generalized)  Ulnar neuropathy at elbow of right upper extremity    Problem List Patient Active Problem List   Diagnosis Date Noted   S/P TKR (total knee replacement) using cement, left 04/12/2021   Cervical myelopathy (HCC) 01/13/2021   Seizure (HCC) 11/26/2020   Abnormal thyroid blood test 04/06/2016   Encounter for screening for HIV 01/05/2016   Abnormal weight gain 01/05/2016   Chronic prescription benzodiazepine use 10/19/2015   Elevated liver enzymes 08/20/2015   Medication monitoring encounter 08/20/2015   Hypokalemia 08/20/2015   Erectile dysfunction 04/22/2015   Status post hardware removal 04/20/2015   Bipolar 2 disorder (HCC) 07/29/2014   Marijuana abuse 07/29/2014   Chronic knee pain 07/29/2014   Bipolar 1 disorder, mixed (HCC)    Drug abuse, opioid type (HCC) 06/28/2013   GERD without esophagitis 10/03/2012    Hypertension goal BP (blood pressure) < 140/90 10/03/2012   Chronic pain associated with significant psychosocial dysfunction 07/17/2012   Lumbar canal stenosis 04/09/2012   Hypercholesterolemia 01/06/2012   CN (constipation) 11/11/2011   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/2010   Decreased motor strength 09/30/2010   Current tobacco use 09/30/2010   H/O transient cerebral ischemia 09/09/2010   LBP (low back pain) 08/30/2010   Cervical spinal cord compression (HCC) 07/22/2010   Cervical post-laminectomy syndrome 07/22/2010   Chronic obstructive pulmonary disease (HCC) 02/23/2010    T , OTR/L, CLT  ,, OT 01/19/2022, 3:32 PM  [facility]IC[facility] [address], [place], [address] Phone: [phone]   Fax:  [phone]  Name: ISAACK PREBLE MRN: [id number] Date of Birth: 08-Jan-1971

## 2022-01-20 ENCOUNTER — Ambulatory Visit: Payer: Medicaid Other | Admitting: Occupational Therapy

## 2022-01-20 DIAGNOSIS — M25631 Stiffness of right wrist, not elsewhere classified: Secondary | ICD-10-CM

## 2022-01-20 DIAGNOSIS — M25641 Stiffness of right hand, not elsewhere classified: Secondary | ICD-10-CM

## 2022-01-20 DIAGNOSIS — G5621 Lesion of ulnar nerve, right upper limb: Secondary | ICD-10-CM

## 2022-01-20 DIAGNOSIS — M6281 Muscle weakness (generalized): Secondary | ICD-10-CM

## 2022-01-20 NOTE — Therapy (Signed)
[facility] [address] [place], [place], [address] Phone: [phone]   Fax:  [phone]  Occupational Therapy Treatment  Patient Details  Name: Scott Howell MRN: [id number] Date of Birth: 1970-03-20 Referring Provider (OT): Dr Joice Lofts   Encounter Date: 01/20/2022   OT End of Session - 01/20/22 0908     Visit Number 4    Number of Visits 16    Date for OT Re-Evaluation 03/22/22    OT Start Time 0908    OT Stop Time 0950    OT Time Calculation (min) 42 min    Activity Tolerance Patient tolerated treatment well    Behavior During Therapy [facility] for tasks assessed/performed             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   Schizophrenia (HCC)    Seizures (HCC) 11/2020   Stroke (HCC)    no deficits   Tobacco use     Past Surgical History:  Procedure Laterality Date   ANTERIOR CERVICAL DECOMP/DISCECTOMY FUSION N/A 01/13/2021   Procedure: C3-5 ANTERIOR CERVICAL DECOMPRESSION/DISCECTOMY FUSION;  Surgeon: Venetia Night, MD;  Location: [facility] ORS;  Service: Neurosurgery;  Laterality: N/A;   CERVICAL SPINE SURGERY     4   CLAVICLE SURGERY     HARDWARE REMOVAL Left 04/20/2015   Procedure: HARDWARE REMOVAL left leg;  Surgeon: Deeann Saint, MD;  Location: [facility];  Service: Orthopedics;  Laterality: Left;   KNEE SURGERY Left    NECK SURGERY     x6   SHOULDER SURGERY Left    TOTAL KNEE ARTHROPLASTY  Left 04/12/2021   Procedure: TOTAL KNEE ARTHROPLASTY;  Surgeon: Lyndle Herrlich, MD;  Location: [facility];  Service: Orthopedics;  Laterality: Left;   ULNAR NERVE TRANSPOSITION Right 11/17/2021   Procedure: SUBCUTANEOUS TRANSPOSITION OF ULNAR NERVE, RIGHT ELBOW;  Surgeon: Christena Flake, MD;  Location: [facility];  Service: Orthopedics;  Laterality: Right;    There were no vitals filed for this visit.   Subjective Assessment - 01/20/22 0907     Subjective  My car broke Tues and had to repair my car using L hand and R as assist - did not do any exerices yesterday and today was hurting really bad my whole arm - but did fix my car.    Pertinent History 11/28/21 seen by Ortho PA- Jose Persia. is a 51 y.o. who presents today that is post right ulnar nerve transposition performed on 11/17/2021. Patient has discontinued wearing the sling and bracing that with applied at the time of surgery. He states he has had surgery on his neck and the sling was aggravating his neck. He has been working on range of motion of the elbow. His pleased with the results. Not a lot of change in regards to  numbness weakness of the upper extremity. He is complaining of a lot of pain to the arm.  Refer to PT but because of athrophy in R UE and neuropathy refer to OT - pt had C3-5 ANTERIOR CERVICAL DECOMPRESSION/DISCECTOMY FUSION 01/13/2021 Dr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 Yes    Pain Score 8    ulnar elbow down into 4thand 5th digit   Pain Location Arm    Pain Orientation Right    Pain Descriptors / Indicators Aching;Spasm    Pain Type Chronic pain;Surgical pain    Pain Onset More than a month ago    Pain Frequency Constant                       Patient arrived with increased pain and spasms in  right upper extremity since had to repair his car Tuesday in the cold for 3 hours.  Admits he could not do his exercises much yesterday.   OT Treatments/Exercises (OP) - 01/20/22 0001       Moist Heat Therapy   Number Minutes Moist Heat 8 Minutes    Moist Heat Location --   arm prior to ROM -          Pain decreased to 2/10 after moist heat Initiated some weightbearing through the palm prior to range of motion. This date fabricated a figure-of-eight antecubital ulnar nerve splint for fourth and fifth. Patient fitted and educated on wearing it during the day with use.  Increase wearing time gradually. Patient was able to show more PIP extension at fourth and fifth with splint on. Also able to grasp objects and release it with increased ease.  Continue shoulder and elbow active range of motion- shoulder abduction with less pain 8 repetitions Shoulder flexion 10 reps Elbow flexion 3 positions 5 reps each Decreased pain this date and reports his shoulder is feeling better in session.      Pt required min assist this date with instruction and demonstration.  He demonstrates greater understanding of weight bearing in between exercises to reinforce extension and decrease flexor tightness. Weight bearing is light stretch, no pushing.   Issued additional Tubigrip for compression of the elbow and wrist brace on at night, he reports he forgot to take it at his last session.  Pt has Wrist prefab splint to decrease RD and flexion tightness - sleep too with it    AAROM UD 8 reps  AAROM and PROM wrist flexion and ext open hand 8 reps   Rolling hand over red foam roller for soft tissue mobs and ext of digits 2 x 8reps  Block wrist and hand during lumbrical fist 2 x 6 reps Wrist splint on during intrinsic fist in composite fist with AAROM for digits extention and fisting with AAROM for digits extention    WB again reinforce in between each exercise for 1 min  Thumb PA and RA 2x 8reps  At this  date active assisted range of motion for fifth digit abduction 8 reps   Pt to perform HEP 2-3 x day after moist heat             OT Education - 01/20/22 0908     Education  Details HEP with handout and splint use    Person(s) Educated Patient    Methods Explanation;Demonstration;Tactile cues;Verbal cues;Handout    Comprehension Verbal cues required;Returned demonstration;Verbalized understanding              OT Short Term Goals - 01/11/22 2019       OT SHORT TERM GOAL #1   Title Pt to be ind in use of R wrist splint and WB HEP to increase UD and wrist ext with digits extention    Baseline Kept wrist in 30 degrees of RD, pain and tightness , wrist drops into flexion with attmpts of digits extention - tightness mostly in flexors of hand and forearm    Time 3    Period Weeks    Status New    Target Date 02/01/22      OT SHORT TERM GOAL #2   Title R thumb AROM increase in all planes for pt to hold cup, pick up 1 cm objects and use utencils    Baseline R thumb flexion decrease - tightness in extensors- decrease PA  45 and RA  38- not able to use hand functional in ADL's    Time 5    Period Weeks    Status New    Target Date 02/15/22      OT SHORT TERM GOAL #3   Title R wrist  motion increase in all ranges including sup with pain less than 2/10 to turn doorknob, stabilize wrist neutral during gripping and lifting of objects    Baseline supination pain 4/10 ; wrist flex during attempts of gripping and during lifting or extension of digits - wrist drop into flexion    Time 6    Period Weeks    Status New    Target Date 02/22/22      OT SHORT TERM GOAL #4   Title R shoulder and elbow AROM increase to [facility] for pt use UE in ADL's with symptoms less than 3/10    Baseline spasm in arm with ABd shoulder to 90 , same with elbow extention in supination - end range limited ext -10 and pain increase to 6-8/10    Time 8    Period Weeks    Status New    Target Date 03/08/22                       Plan - 01/20/22 0908     Clinical Impression Statement Pt present 8 1/2 wks s/p R ulnar N transposition- but pt with hx of 5 cervical surgeries with last one in Nov 22 after having seizures for few days in Nov 22 prior. Pt with significant atrophy and tightness in R dominant arm. At eval pt's houlder ABD to 90 and pain 6/10. Shoulder flexion WNL ext -10 and pain with end range. Wrist kept in 30 degrees of RD and thumb ADD- pain and tightness with attempts of PROM UD and thumb flexion. Flexion 40 and 60 extention. Pain with supination- unable to extend digits without flexion of wrist during attempt but PIP flexion tightness in 4th and 5th digits. Tinel at ulnar wrist reported. Pt cont to be very motivated in sessions to improve his ROM , strength and use of R dominant UE since his seizures in Nov followed by cervical surgery and now 11/17/21 R ulnar N transpostion. Pt arrive with 8/10 pain and spasm in R arm into 4th and 5th digits because of had to repair his car 2  days ago for 3 hrs. Pain decrease in session to 2/10 and able to show increase shoulder and elbow flexion, ext,  UD , 2nd and 3rd digit extention. Fabricated oval 8 anti ulnar N splint for 4th and 5th to decrease clawing and allow more PIP extention during release of objects. Pt remains limited in use of R dominant UE in ADL's and IADL's and continues to benefit from skiled OT services to increase ROM, strength and function    OT Occupational Profile and History Problem Focused Assessment - Including review of records relating to presenting problem    Occupational performance deficits (Please refer to evaluation for details): ADL's;IADL's;Rest and Sleep;Play;Work;Leisure;Social Participation    Body Structure / Function / Physical Skills ADL;Coordination;Dexterity;Decreased knowledge of use of DME;Flexibility;ROM;UE functional use;Scar mobility;Muscle spasms;Sensation;Pain;Strength;IADL    Rehab Potential Fair     Clinical Decision Making Limited treatment options, no task modification necessary    Comorbidities Affecting Occupational Performance: May have comorbidities impacting occupational performance    Modification or Assistance to Complete Evaluation  No modification of tasks or assist necessary to complete eval    OT Frequency 2x / week    OT Duration 8 weeks    OT Treatment/Interventions Self-care/ADL training;Moist Heat;Therapeutic exercise;Patient/family education;Splinting;Scar mobilization;DME and/or AE instruction;Manual Therapy;Passive range of motion    Consulted and Agree with Plan of Care Patient             Patient will benefit from skilled therapeutic intervention in order to improve the following deficits and impairments:   Body Structure / Function / Physical Skills: ADL, Coordination, Dexterity, Decreased knowledge of use of DME, Flexibility, ROM, UE functional use, Scar mobility, Muscle spasms, Sensation, Pain, Strength, IADL       Visit Diagnosis: Stiffness of right hand, not elsewhere classified  Stiffness of right wrist, not elsewhere classified  Muscle weakness (generalized)  Ulnar neuropathy at elbow of right upper extremity    Problem List Patient Active Problem List   Diagnosis Date Noted   S/P TKR (total knee replacement) using cement, left 04/12/2021   Cervical myelopathy (HCC) 01/13/2021   Seizure (HCC) 11/26/2020   Abnormal thyroid blood test 04/06/2016   Encounter for screening for HIV 01/05/2016   Abnormal weight gain 01/05/2016   Chronic prescription benzodiazepine use 10/19/2015   Elevated liver enzymes 08/20/2015   Medication monitoring encounter 08/20/2015   Hypokalemia 08/20/2015   Erectile dysfunction 04/22/2015   Status post hardware removal 04/20/2015   Bipolar 2 disorder (HCC) 07/29/2014   Marijuana abuse 07/29/2014   Chronic knee pain 07/29/2014   Bipolar 1 disorder, mixed (HCC)    Drug abuse, opioid type (HCC) 06/28/2013   GERD  without esophagitis 10/03/2012   Hypertension goal BP (blood pressure) < 140/90 10/03/2012   Chronic pain associated with significant psychosocial dysfunction 07/17/2012   Lumbar canal stenosis 04/09/2012   Hypercholesterolemia 01/06/2012   CN (constipation) 11/11/2011   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/2010   Decreased motor strength 09/30/2010   Current tobacco use 09/30/2010   H/O transient cerebral ischemia 09/09/2010   LBP (low back pain) 08/30/2010   Cervical spinal cord compression (HCC) 07/22/2010   Cervical post-laminectomy syndrome 07/22/2010   Chronic obstructive pulmonary disease (HCC) 02/23/2010    Oletta Cohn, OTR/L,CLT 01/20/2022, 1:09 PM  [facility] [address], [place], [address] Phone: [phone]   Fax:  [phone]  Name: Scott Howell MRN: [id number] Date of Birth: Jul 15, 1970

## 2022-01-24 ENCOUNTER — Ambulatory Visit: Payer: Medicaid Other | Admitting: Occupational Therapy

## 2022-01-24 DIAGNOSIS — M25631 Stiffness of right wrist, not elsewhere classified: Secondary | ICD-10-CM

## 2022-01-24 DIAGNOSIS — G5621 Lesion of ulnar nerve, right upper limb: Secondary | ICD-10-CM

## 2022-01-24 DIAGNOSIS — M25641 Stiffness of right hand, not elsewhere classified: Secondary | ICD-10-CM | POA: Diagnosis not present

## 2022-01-24 DIAGNOSIS — M6281 Muscle weakness (generalized): Secondary | ICD-10-CM

## 2022-01-24 NOTE — Therapy (Signed)
[facility] [address] [place], [place], [address] Phone: [phone]   Fax:  [phone]  Occupational Therapy Treatment  Patient Details  Name: Scott Howell MRN: [id number] Date of Birth: [birth year] Referring Provider (OT): Dr Joice Lofts   Encounter Date: 01/24/2022   OT End of Session - 01/24/22 1331     Visit Number 5    Number of Visits 16    Date for OT Re-Evaluation 03/22/22    OT Start Time 0817    OT Stop Time 0901    OT Time Calculation (min) 44 min    Activity Tolerance Patient tolerated treatment well    Behavior During Therapy [facility] for tasks assessed/performed             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   Schizophrenia (HCC)    Seizures (HCC) 11/2020   Stroke (HCC)    no deficits   Tobacco use     Past Surgical History:  Procedure Laterality Date   ANTERIOR CERVICAL DECOMP/DISCECTOMY FUSION N/A 01/13/2021   Procedure: C3-5 ANTERIOR CERVICAL DECOMPRESSION/DISCECTOMY FUSION;  Surgeon: Venetia Night, MD;  Location: [facility] ORS;  Service: Neurosurgery;  Laterality: N/A;   CERVICAL SPINE SURGERY     4   CLAVICLE SURGERY     HARDWARE REMOVAL Left 04/20/2015   Procedure: HARDWARE REMOVAL left leg;  Surgeon: Deeann Saint, MD;  Location: [facility];  Service: Orthopedics;  Laterality: Left;   KNEE SURGERY Left    NECK SURGERY     x6   SHOULDER SURGERY Left    TOTAL KNEE  ARTHROPLASTY Left 04/12/2021   Procedure: TOTAL KNEE ARTHROPLASTY;  Surgeon: Lyndle Herrlich, MD;  Location: [facility];  Service: Orthopedics;  Laterality: Left;   ULNAR NERVE TRANSPOSITION Right 11/17/2021   Procedure: SUBCUTANEOUS TRANSPOSITION OF ULNAR NERVE, RIGHT ELBOW;  Surgeon: Christena Flake, MD;  Location: [facility];  Service: Orthopedics;  Laterality: Right;    There were no vitals filed for this visit.   Subjective Assessment - 01/24/22 1330     Subjective  DOing okay -but that splint hurt me when wearing it with my wrist one- trying to use my hand more - able to do more reps without spasm    Pertinent History 11/28/21 seen by Ortho PA- Jose Persia. is a 51 y.o. who presents today that is post right ulnar nerve transposition performed on 11/17/2021. Patient has discontinued wearing the sling and bracing that with applied at the time of surgery. He states he has had surgery on his neck and the sling was aggravating his neck. He has been working on range of motion of the elbow. His pleased with the results. Not a lot of change in regards to numbness weakness of the upper extremity. He is complaining of a  lot of pain to the arm.  Refer to PT but because of athrophy in R UE and neuropathy refer to OT - pt had C3-5 ANTERIOR CERVICAL DECOMPRESSION/DISCECTOMY FUSION 01/13/2021 Dr. 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 Yes    Pain Score 4     Pain Location Arm    Pain Orientation Right    Pain Descriptors / Indicators Aching;Spasm    Pain Type Chronic pain;Acute pain              Moist heat prior to ROM done    Pain decreased after moist heat - able to to table slides for wrist and forearm stretch 20 reps- no pain Prior to wrist exercises  UD hands together and place and hold  wrist extention   Modify padding and strap  to figure-of-eight anti claw ulnar nerve splint for fourth and fifth. Patient fitted and educated on wearing it during the day HEP -especially lumbrical and grasp and release. Patient was able to show more PIP extension at fourth and fifth with splint on. Also able to grasp objects and release it with increased ease.- small cylinder   Continue shoulder and elbow active range of motion- shoulder abduction with less pain 8 repetitions Shoulder flexion 10 reps Elbow flexion 3 positions 5 reps each Decreased pain this date and reports his shoulder is feeling better in session.      Pt required min assist this date with instruction and demonstration.  He demonstrates greater understanding of weight bearing in between exercises to reinforce extension and decrease flexor tightness. Weight bearing slides on table , no pushing.   Cont Tubigrip for compression of the elbow and wrist brace on at night, he reports he forgot to take it at his last session.  Pt has Wrist prefab splint to decrease RD and flexion tightness - sleep too with it    AAROM UD 10 reps  AAROM and PROM wrist flexion and ext open hand 8 reps   Block wrist and hand during lumbrical fist 2 x 6 reps anti claw splint   Able to stabilize wrist at neutral for intrinsic fist  and then place and hold 5 x 3 reps extention of digist  And then stabilize wrist  composite fist  with  digiits place and hold extention 5 x 3 sets    WB again reinforce in between each exercise for 1 min  Thumb PA and RA 10 reps  At this date active assisted range of motion for fifth digit abduction 10 reps   Pt to perform HEP 2-3 x day after moist heat                         OT Education - 01/24/22 1331     Education Details HEP with handout and splint use    Person(s) Educated Patient    Methods Explanation;Demonstration;Tactile cues;Verbal cues;Handout    Comprehension Verbal cues  required;Returned demonstration;Verbalized understanding              OT Short Term Goals - 01/11/22 2019       OT SHORT TERM GOAL #1   Title Pt to be ind in use of R wrist splint  and WB HEP to increase UD and wrist ext with digits extention    Baseline Kept wrist in 30 degrees of RD, pain and tightness , wrist drops into flexion with attmpts of digits extention - tightness mostly in flexors of hand and forearm    Time 3    Period Weeks    Status New    Target Date 02/01/22      OT SHORT TERM GOAL #2   Title R thumb AROM increase in all planes for pt to hold cup, pick up 1 cm objects and use utencils    Baseline R thumb flexion decrease - tightness in extensors- decrease PA  45 and RA  38- not able to use hand functional in ADL's    Time 5    Period Weeks    Status New    Target Date 02/15/22      OT SHORT TERM GOAL #3   Title R wrist  motion increase in all ranges including sup with pain less than 2/10 to turn doorknob, stabilize wrist neutral during gripping and lifting of objects    Baseline supination pain 4/10 ; wrist flex during attempts of gripping and during lifting or extension of digits - wrist drop into flexion    Time 6    Period Weeks    Status New    Target Date 02/22/22      OT SHORT TERM GOAL #4   Title R shoulder and elbow AROM increase to [facility] for pt use UE in ADL's with symptoms less than 3/10    Baseline spasm in arm with ABd shoulder to 90 , same with elbow extention in supination - end range limited ext -10 and pain increase to 6-8/10    Time 8    Period Weeks    Status New    Target Date 03/08/22                      Plan - 01/24/22 1331     Clinical Impression Statement Pt present 9 1/2 wks s/p R ulnar N transposition- but pt with hx of 5 cervical surgeries with last one in Nov 22 after having seizures for few days in Nov 22 prior. Pt with significant atrophy and tightness in R dominant arm. At eval pt's houlder ABD to 90 and pain  6/10. Shoulder flexion WNL ext -10 and pain with end range. Wrist kept in 30 degrees of RD and thumb ADD- pain and tightness with attempts of PROM UD and thumb flexion. Flexion 40 and 60 extention. Pain with supination- unable to extend digits without flexion of wrist during attempt but PIP flexion tightness in 4th and 5th digits. Tinel at ulnar wrist reported. Pt cont to be very motivated in sessions to improve his ROM , strength and use of R dominant UE since his seizures in Nov followed by cervical surgery and now 11/17/21 R ulnar N transpostion.  Pt show increase reps and motion prior to spasm and increase pain. Able to show increase extention of 2nd thru 4th digits with wrist neutral. change pt to only wear anti claw ulnar N splint with HEP - able to change pt for wrist and forearm flexor stretches to table slides prior to UD and  wrist extention place and hold. Pt show increase shoulder and elbow flexion, ext,  UD , 2nd and 3rd digit extention with less pain and spasms. Pt remains limited in use of R dominant UE in ADL's and IADL's  and continues to benefit from skiled OT services to increase ROM, strength and function    OT Occupational Profile and History Problem Focused Assessment - Including review of records relating to presenting problem    Occupational performance deficits (Please refer to evaluation for details): ADL's;IADL's;Rest and Sleep;Play;Work;Leisure;Social Participation    Body Structure / Function / Physical Skills ADL;Coordination;Dexterity;Decreased knowledge of use of DME;Flexibility;ROM;UE functional use;Scar mobility;Muscle spasms;Sensation;Pain;Strength;IADL    Rehab Potential Fair    Clinical Decision Making Limited treatment options, no task modification necessary    Comorbidities Affecting Occupational Performance: May have comorbidities impacting occupational performance    Modification or Assistance to Complete Evaluation  No modification of tasks or assist necessary to  complete eval    OT Frequency 2x / week    OT Duration 8 weeks    OT Treatment/Interventions Self-care/ADL training;Moist Heat;Therapeutic exercise;Patient/family education;Splinting;Scar mobilization;DME and/or AE instruction;Manual Therapy;Passive range of motion    Consulted and Agree with Plan of Care Patient             Patient will benefit from skilled therapeutic intervention in order to improve the following deficits and impairments:   Body Structure / Function / Physical Skills: ADL, Coordination, Dexterity, Decreased knowledge of use of DME, Flexibility, ROM, UE functional use, Scar mobility, Muscle spasms, Sensation, Pain, Strength, IADL       Visit Diagnosis: Stiffness of right hand, not elsewhere classified  Stiffness of right wrist, not elsewhere classified  Muscle weakness (generalized)  Ulnar neuropathy at elbow of right upper extremity    Problem List Patient Active Problem List   Diagnosis Date Noted   S/P TKR (total knee replacement) using cement, left 04/12/2021   Cervical myelopathy (HCC) 01/13/2021   Seizure (HCC) 11/26/2020   Abnormal thyroid blood test 04/06/2016   Encounter for screening for HIV 01/05/2016   Abnormal weight gain 01/05/2016   Chronic prescription benzodiazepine use 10/19/2015   Elevated liver enzymes 08/20/2015   Medication monitoring encounter 08/20/2015   Hypokalemia 08/20/2015   Erectile dysfunction 04/22/2015   Status post hardware removal 04/20/2015   Bipolar 2 disorder (HCC) 07/29/2014   Marijuana abuse 07/29/2014   Chronic knee pain 07/29/2014   Bipolar 1 disorder, mixed (HCC)    Drug abuse, opioid type (HCC) 06/28/2013   GERD without esophagitis 10/03/2012   Hypertension goal BP (blood pressure) < 140/90 10/03/2012   Chronic pain associated with significant psychosocial dysfunction 07/17/2012   Lumbar canal stenosis 04/09/2012   Hypercholesterolemia 01/06/2012   CN (constipation) 11/11/2011   Benign fibroma of  prostate 12/17/2010   Decreased motor strength 09/30/2010   Current tobacco use 09/30/2010   H/O transient cerebral ischemia 09/09/2010   LBP (low back pain) 08/30/2010   Cervical spinal cord compression (HCC) 07/22/2010   Cervical post-laminectomy syndrome 07/22/2010   Chronic obstructive pulmonary disease (HCC) 02/23/2010    Oletta Cohn, OTR/L,CLT 01/24/2022, 1:37 PM  [facility] [address] [place], [address] Phone: [phone]   Fax:  [phone]  Name: Scott Howell MRN: [id number] Date of Birth: 07-Oct-1970

## 2022-01-27 ENCOUNTER — Ambulatory Visit: Payer: Medicaid Other | Admitting: Occupational Therapy

## 2022-01-27 DIAGNOSIS — M25631 Stiffness of right wrist, not elsewhere classified: Secondary | ICD-10-CM

## 2022-01-27 DIAGNOSIS — M25641 Stiffness of right hand, not elsewhere classified: Secondary | ICD-10-CM

## 2022-01-27 DIAGNOSIS — M6281 Muscle weakness (generalized): Secondary | ICD-10-CM

## 2022-01-27 DIAGNOSIS — G5621 Lesion of ulnar nerve, right upper limb: Secondary | ICD-10-CM

## 2022-01-27 NOTE — Therapy (Signed)
[facility] [address], [place], [address] Phone: [phone]   Fax:  [phone]  Occupational Therapy Treatment  Patient Details  Name: Scott Howell MRN: [id number] Date of Birth: [birth year] Referring Provider (OT): Dr Roland Rack   Encounter Date: 01/27/2022   OT End of Session - 01/27/22 1434     Visit Number 6    Number of Visits 16    Date for OT Re-Evaluation 03/22/22    OT Start Time 0901    OT Stop Time 0947    OT Time Calculation (min) 46 min    Activity Tolerance Patient tolerated treatment well    Behavior During Therapy [facility] for tasks assessed/performed             Past Medical History:  Diagnosis Date   Arthritis    Asthma    Bipolar 1 disorder ([place])    COPD (chronic obstructive pulmonary disease) ([place])    Drug abuse, opioid type ([place])    Dyspnea    Fibromyalgia    GERD (gastroesophageal reflux disease)    H/O ETOH abuse    Headache    High cholesterol    History of 2019 novel coronavirus disease (COVID-19) 11/01/2021   Homicidal ideation    Hypertension    Living accommodation issues    a.) as of 03/29/2021 --> living in camper   Marijuana use    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   Schizophrenia ([place])    Seizures ([place]) 11/2020   Stroke ([place])    no deficits   Tobacco use     Past Surgical History:  Procedure Laterality Date   ANTERIOR CERVICAL DECOMP/DISCECTOMY FUSION N/A 01/13/2021   Procedure: C3-5 ANTERIOR CERVICAL DECOMPRESSION/DISCECTOMY FUSION;  Surgeon: Meade Maw, MD;  Location: [facility] ORS;  Service: Neurosurgery;  Laterality: N/A;   CERVICAL SPINE SURGERY     4   CLAVICLE SURGERY     HARDWARE REMOVAL Left 04/20/2015   Procedure: HARDWARE REMOVAL left leg;  Surgeon: Earnestine Leys, MD;  Location: [facility] ORS;  Service: Orthopedics;  Laterality: Left;   KNEE SURGERY Left    NECK SURGERY     x6   SHOULDER SURGERY Left    TOTAL KNEE  ARTHROPLASTY Left 04/12/2021   Procedure: TOTAL KNEE ARTHROPLASTY;  Surgeon: Lovell Sheehan, MD;  Location: [facility] ORS;  Service: Orthopedics;  Laterality: Left;   ULNAR NERVE TRANSPOSITION Right 11/17/2021   Procedure: SUBCUTANEOUS TRANSPOSITION OF ULNAR NERVE, RIGHT ELBOW;  Surgeon: Corky Mull, MD;  Location: [facility] ORS;  Service: Orthopedics;  Laterality: Right;    There were no vitals filed for this visit.   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 is a 51 y.o. who presents today that is post right ulnar nerve transposition performed on 11/17/2021. Patient has discontinued wearing the sling and bracing that with applied at the time of surgery. He states he has had surgery on his neck and the sling was aggravating his neck. He has been working on range of motion of the elbow. His pleased with the results. Not a lot of change in regards to  numbness weakness of the upper extremity. He is complaining of a lot of pain to the arm.  Refer to PT but because of athrophy in R UE and neuropathy refer to OT - pt had C3-5 ANTERIOR CERVICAL DECOMPRESSION/DISCECTOMY FUSION 01/13/2021 Dr. 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 Yes    Pain Score 9     Pain Location Arm    Pain Orientation Right    Pain Descriptors / Indicators Aching;Tightness;Spasm    Pain Type Acute pain;Chronic pain;Surgical pain    Pain Onset 1 to 4 weeks ago                    Upon arrival the simulation of the patient's work activity that I done prior to November of last year  bricklaying useTrowel -simulated set up and lay out height as well as weight. Weight on trowel about 2 lbs -and then laying on horizontal surface up to about 6 feet. Patient is increase shoulder abduction and flexion as well as horizontal abduction and adduction but with weight of 2 pounds patient had increased spasm and pain up to 9/10. Pain mostly elbow forearm to hand.       OT Treatments/Exercises (OP) - 01/27/22 0001       Moist Heat Therapy   Number Minutes Moist Heat 8 Minutes    Moist Heat Location --   elbow to hand prior and during decrease spasm           Pain and spasm decreased after the moist heat and weightbearing.  Patient was able to do shoulder flexion and abduction as well as horizontal abduction adduction more reps less pain. Add 1 pound weight for patient to do at home 2 sets of 8-10 with weightbearing in between.  Remind patient not to flex elbow more than 90 degrees as well as stop when feeling a tightness or spasm do not push through the pain.   Was able to change home exercises to passive range of motion for ulnar deviation prior to using a 16 ounce hammer over 1 pound weight for ulnar deviation over the armrest 2 sets of 8 Also able to do supination pronation on the left 2 sets of 8. Patient was able to maintain wrist neutral during supination and mid position.  With more reps patient showed increased radial deviation and flexion at the wrist with grip. Encourage patient to do weightbearing in between or moist heat on forearm and hand.  Hand exercises for thumb and fifth digit as well as tendon glides for fingers staying the same. Patient can use neoprene Benik wrap on wrist if feels needs a little support but not immobilization.     OT Education - 01/27/22 1434     Education Details HEP with handout and splint use    Person(s) Educated Patient    Methods Explanation;Demonstration;Tactile cues;Verbal cues;Handout    Comprehension Verbal cues  required;Returned demonstration;Verbalized understanding              OT Short Term Goals - 01/11/22 2019       OT SHORT TERM GOAL #1   Title Pt to be ind in use of R wrist splint and WB HEP to increase UD and wrist  ext with digits extention    Baseline Kept wrist in 30 degrees of RD, pain and tightness , wrist drops into flexion with attmpts of digits extention - tightness mostly in flexors of hand and forearm    Time 3    Period Weeks    Status New    Target Date 02/01/22      OT SHORT TERM GOAL #2   Title R thumb AROM increase in all planes for pt to hold cup, pick up 1 cm objects and use utencils    Baseline R thumb flexion decrease - tightness in extensors- decrease PA  45 and RA  38- not able to use hand functional in ADL's    Time 5    Period Weeks    Status New    Target Date 02/15/22      OT SHORT TERM GOAL #3   Title R wrist  motion increase in all ranges including sup with pain less than 2/10 to turn doorknob, stabilize wrist neutral during gripping and lifting of objects    Baseline supination pain 4/10 ; wrist flex during attempts of gripping and during lifting or extension of digits - wrist drop into flexion    Time 6    Period Weeks    Status New    Target Date 02/22/22      OT SHORT TERM GOAL #4   Title R shoulder and elbow AROM increase to [facility] for pt use UE in ADL's with symptoms less than 3/10    Baseline spasm in arm with ABd shoulder to 90 , same with elbow extention in supination - end range limited ext -10 and pain increase to 6-8/10    Time 8    Period Weeks    Status New    Target Date 03/08/22                      Plan - 01/27/22 1435     Clinical Impression Statement Pt present 10 wks s/p R ulnar N transposition- but pt with hx of 5 cervical surgeries with last one in Nov 22 after having seizures for few days in Nov 22 prior. Pt with significant atrophy and tightness in R dominant arm. At eval pt's houlder ABD to 90 and pain 6/10.  Shoulder flexion WNL ext -10 and pain with end range. Wrist kept in 30 degrees of RD and thumb ADD- pain and tightness with attempts of PROM UD and thumb flexion. Flexion 40 and 60 extention. Pain with supination- unable to extend digits without flexion of wrist during attempt but PIP flexion tightness in 4th and 5th digits. Tinel at ulnar wrist reported. Pt cont to be very motivated in sessions to improve his ROM , strength and use of R dominant UE since his seizures in Nov followed by cervical surgery and now 11/17/21 R ulnar N transpostion.  Pt show increase AROM in shoulder ABD and flexion WNL but pain with use of 2 lbs weight and simulation of bricklaying for 5 min - increase pain and spasm 9/10 in arm - supination WNL but increase wrist flexion with grip. Add this date PROM for UD prior to 1 lbs weight for UD over armrest and add for sup /pro on pillow. Pt to do about 8 reps - do weight bearing  to decrease spasm and pain. Pt to start 1 lbs for shoulder flexion, ABD and horizontal abd but do not flex elbow more than 90 degrees . Ed pt  to not push to point of pain but when feeling tightness prio to spasm - weight bear or use heat. Pt remains limited in use of R dominant UE in ADL's and IADL's and continues to benefit from skiled OT services to increase ROM, strength and function    OT Occupational Profile and History Problem Focused Assessment - Including review of records relating to presenting problem    Occupational performance deficits (Please refer to evaluation for details): ADL's;IADL's;Rest and Sleep;Play;Work;Leisure;Social Participation    Body Structure / Function / Physical Skills ADL;Coordination;Dexterity;Decreased knowledge of use of DME;Flexibility;ROM;UE functional use;Scar mobility;Muscle spasms;Sensation;Pain;Strength;IADL    Rehab Potential Fair    Clinical Decision Making Limited treatment options, no task modification necessary    Comorbidities Affecting Occupational Performance: May  have comorbidities impacting occupational performance    Modification or Assistance to Complete Evaluation  No modification of tasks or assist necessary to complete eval    OT Frequency 2x / week    OT Duration 8 weeks    OT Treatment/Interventions Self-care/ADL training;Moist Heat;Therapeutic exercise;Patient/family education;Splinting;Scar mobilization;DME and/or AE instruction;Manual Therapy;Passive range of motion    Consulted and Agree with Plan of Care Patient             Patient will benefit from skilled therapeutic intervention in order to improve the following deficits and impairments:   Body Structure / Function / Physical Skills: ADL, Coordination, Dexterity, Decreased knowledge of use of DME, Flexibility, ROM, UE functional use, Scar mobility, Muscle spasms, Sensation, Pain, Strength, IADL       Visit Diagnosis: Stiffness of right hand, not elsewhere classified  Stiffness of right wrist, not elsewhere classified  Muscle weakness (generalized)  Ulnar neuropathy at elbow of right upper extremity    Problem List Patient Active Problem List   Diagnosis Date Noted   S/P TKR (total knee replacement) using cement, left 04/12/2021   Cervical myelopathy ([place]) 01/13/2021   Seizure ([place]) 11/26/2020   Abnormal thyroid blood test 04/06/2016   Encounter for screening for HIV 01/05/2016   Abnormal weight gain 01/05/2016   Chronic prescription benzodiazepine use 10/19/2015   Elevated liver enzymes 08/20/2015   Medication monitoring encounter 08/20/2015   Hypokalemia 08/20/2015   Erectile dysfunction 04/22/2015   Status post hardware removal 04/20/2015   Bipolar 2 disorder ([place]) 07/29/2014   Marijuana abuse 07/29/2014   Chronic knee pain 07/29/2014   Bipolar 1 disorder, mixed (H[facility])    Drug abuse, opioid type ([place]) 06/28/2013   GERD without esophagitis 10/03/2012   Hypertension goal BP (blood pressure) < 140/90 10/03/2012   Chronic pain associated with significant  psychosocial dysfunction 07/17/2012   Lumbar canal stenosis 04/09/2012   Hypercholesterolemia 01/06/2012   CN (constipation) 11/11/2011   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/2010   Decreased motor strength 09/30/2010   Current tobacco use 09/30/2010   H/O transient cerebral ischemia 09/09/2010   LBP (low back pain) 08/30/2010   Cervical spinal cord compression (HCC) 07/22/2010   Cervical post-laminectomy syndrome 07/22/2010   Chronic obstructive pulmonary disease ([place]) 02/23/2010    Rosalyn Gess, OTR/L,CLT 01/27/2022, 2:39 PM  [facility] [address] [place], [address] Phone: [phone]   Fax:  [phone]  Name: BEAUDEN HUFFINE MRN: [id number] Date of Birth: Jul 22, 1970

## 2022-01-31 ENCOUNTER — Ambulatory Visit: Payer: Medicaid Other | Admitting: Occupational Therapy

## 2022-01-31 DIAGNOSIS — M25641 Stiffness of right hand, not elsewhere classified: Secondary | ICD-10-CM

## 2022-01-31 DIAGNOSIS — M25631 Stiffness of right wrist, not elsewhere classified: Secondary | ICD-10-CM

## 2022-01-31 DIAGNOSIS — M6281 Muscle weakness (generalized): Secondary | ICD-10-CM

## 2022-01-31 DIAGNOSIS — G5621 Lesion of ulnar nerve, right upper limb: Secondary | ICD-10-CM

## 2022-01-31 NOTE — Therapy (Signed)
[facility] [address] [place], [place], [address] Phone: [phone]   Fax:  [phone]  Occupational Therapy Treatment  Patient Details  Name: Scott Howell MRN: [id number] Date of Birth: Dec 07, 1970 Referring Provider (OT): Dr Joice Lofts   Encounter Date: 01/31/2022   OT End of Session - 01/31/22 1036     Visit Number 7    Number of Visits 16    Date for OT Re-Evaluation 03/22/22    OT Start Time 0821    OT Stop Time 0908    OT Time Calculation (min) 47 min    Activity Tolerance Patient tolerated treatment well    Behavior During Therapy [facility] for tasks assessed/performed             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   Schizophrenia (HCC)    Seizures (HCC) 11/2020   Stroke (HCC)    no deficits   Tobacco use     Past Surgical History:  Procedure Laterality Date   ANTERIOR CERVICAL DECOMP/DISCECTOMY FUSION N/A 01/13/2021   Procedure: C3-5 ANTERIOR CERVICAL DECOMPRESSION/DISCECTOMY FUSION;  Surgeon: Venetia Night, MD;  Location: [facility] ORS;  Service: Neurosurgery;  Laterality: N/A;   CERVICAL SPINE SURGERY     4   CLAVICLE SURGERY     HARDWARE REMOVAL Left 04/20/2015   Procedure: HARDWARE REMOVAL left leg;  Surgeon: Deeann Saint, MD;  Location: [facility] ORS;  Service: Orthopedics;  Laterality: Left;   KNEE SURGERY Left    NECK SURGERY     x6   SHOULDER SURGERY Left    TOTAL KNEE  ARTHROPLASTY Left 04/12/2021   Procedure: TOTAL KNEE ARTHROPLASTY;  Surgeon: Lyndle Herrlich, MD;  Location: [facility] ORS;  Service: Orthopedics;  Laterality: Left;   ULNAR NERVE TRANSPOSITION Right 11/17/2021   Procedure: SUBCUTANEOUS TRANSPOSITION OF ULNAR NERVE, RIGHT ELBOW;  Surgeon: Christena Flake, MD;  Location: [facility] ORS;  Service: Orthopedics;  Laterality: Right;    There were no vitals filed for this visit.   Subjective Assessment - 01/31/22 1034     Subjective  I brought my trowel in for you to see.  I have done those exercises.  I pushed myself.  So I did more than what you told me probably.  I feel like the pain is now mostly from my wrist down in my pinky and ring finger.    Pertinent History 11/28/21 seen by Ortho PA- Jose Persia. is a 51 y.o. who presents today that is post right ulnar nerve transposition performed on 11/17/2021. Patient has discontinued wearing the sling and bracing that with applied at the time of surgery. He states he has had surgery on his neck and the sling was aggravating his neck. He has been working on range of motion of the elbow. His pleased with  the results. Not a lot of change in regards to numbness weakness of the upper extremity. He is complaining of a lot of pain to the arm.  Refer to PT but because of athrophy in R UE and neuropathy refer to OT - pt had C3-5 ANTERIOR CERVICAL DECOMPRESSION/DISCECTOMY FUSION 01/13/2021 Dr[facility] as well as earlier this year Knee surgery- in past 5 cervical surgeries    Patient Stated Goals I want to be able to use my R arm and hand again to do my job as Scientist, water qualitybrick mason, take care of myself    Currently in Pain? Yes    Pain Score 4     Pain Location --   wrist and hand   Pain Orientation --   ulnar side   Pain Descriptors / Indicators Aching;Tightness;Spasm    Pain Type Acute pain;Surgical pain    Pain Onset More than a month ago    Pain Frequency Intermittent              Patient remembered  to bring his Trowel in the use of bricklaying.  Is a large 1 that will mean more weight on it when doing masonry. Patient is increase shoulder abduction and flexion as well as horizontal abduction and adduction report increased strength and was able to do 2 pounds overhead did not count reps and sets just done it several times during the day feeling much stronger.   Feels pain is mostly on ulnar wrist into fifth and fourth digits.          This date done Biodex at 15 to 25 pounds for chest press as well as rowing.  2 sets of 10.   Elbow extension on bar with 15 pounds with elbows tucked in.  Patient with increased spasm on the right wrist into flexion and radial deviation during elbow extension.   Needed cueing not to abduct elbow.      passive range of motion for ulnar deviation prior to using a 16 ounce hammer -for ulnar deviation to neutral over armrest as well as supination to neutral 2 sets of 10.  Can hold at bottom of hammer. With supination patient do need some weightbearing and passive range of motion in between sets.  Encourage patient to do weightbearing in between or moist heat on forearm and hand.   Hand exercises for thumb palmar radial abduction added rubber band 2 sets of 10.  And fifth digit   tendon glides for fingers staying the same.  Placed on hold from lumbrical to full extension. But can do grasp and release with forearm and ulnar side of hand on table around small cone. 3 sets of 5 reps with weightbearing in between do want to spasm into radial deviation. Show increase extension of fourth digit.  With increased sensation reported.  Patient can use neoprene Benik wrap  or wrist splint on wrist if feels needs a little support  or fatigue                          OT Education - 01/31/22 1036     Education Details HEP with handout and splint use    Person(s) Educated Patient    Methods Explanation;Demonstration;Tactile cues;Verbal cues;Handout     Comprehension Verbal cues required;Returned demonstration;Verbalized understanding              OT Short Term Goals - 01/11/22 2019       OT SHORT  TERM GOAL #1   Title Pt to be ind in use of R wrist splint and WB HEP to increase UD and wrist ext with digits extention    Baseline Kept wrist in 30 degrees of RD, pain and tightness , wrist drops into flexion with attmpts of digits extention - tightness mostly in flexors of hand and forearm    Time 3    Period Weeks    Status New    Target Date 02/01/22      OT SHORT TERM GOAL #2   Title R thumb AROM increase in all planes for pt to hold cup, pick up 1 cm objects and use utencils    Baseline R thumb flexion decrease - tightness in extensors- decrease PA  45 and RA  38- not able to use hand functional in ADL's    Time 5    Period Weeks    Status New    Target Date 02/15/22      OT SHORT TERM GOAL #3   Title R wrist  motion increase in all ranges including sup with pain less than 2/10 to turn doorknob, stabilize wrist neutral during gripping and lifting of objects    Baseline supination pain 4/10 ; wrist flex during attempts of gripping and during lifting or extension of digits - wrist drop into flexion    Time 6    Period Weeks    Status New    Target Date 02/22/22      OT SHORT TERM GOAL #4   Title R shoulder and elbow AROM increase to [facility] for pt use UE in ADL's with symptoms less than 3/10    Baseline spasm in arm with ABd shoulder to 90 , same with elbow extention in supination - end range limited ext -10 and pain increase to 6-8/10    Time 8    Period Weeks    Status New    Target Date 03/08/22                      Plan - 01/31/22 1036     Clinical Impression Statement Pt present 10 wks s/p R ulnar N transposition- but pt with hx of 5 cervical surgeries with last one in Nov 22 after having seizures for few days in Nov 22 prior. Pt with significant atrophy and tightness in R dominant arm. At eval pt's houlder  ABD to 90 and pain 6/10. Shoulder flexion WNL ext -10 and pain with end range. Wrist kept in 30 degrees of RD and thumb ADD- pain and tightness with attempts of PROM UD and thumb flexion. Flexion 40 and 60 extention. Pain with supination- unable to extend digits without flexion of wrist during attempt but PIP flexion tightness in 4th and 5th digits. Tinel at ulnar wrist reported. Pt cont to be very motivated in sessions to improve his ROM , strength and use of R dominant UE since his seizures in Nov followed by cervical surgery and now 11/17/21 R ulnar N transpostion.  Pt show.  Patient show increase strength and motion in shoulder and elbow this date.  Was able to start Biodex with weight 15 to 25 pounds.  Patient able to do 2 sets of 10.  Weakness and spasms mostly elbow and distal.  Patient do report pain over the weekend is mostly now from ulnar wrist into fifth and fourth digit. Pain 4-6/10  in session today. Do weight bearing  to decrease spasm and pain inbetween ther  ex. Pt done 2 lbs for supination to neutral and UD to neutral -after PROM for UD prior . Ed pt to not push to point of pain but when feeling tightness prio to spasm - weight bear or use heat. Pt remains limited in use of R dominant UE in ADL's and IADL's and continues to benefit from skiled OT services to increase ROM, strength and function    OT Occupational Profile and History Problem Focused Assessment - Including review of records relating to presenting problem    Occupational performance deficits (Please refer to evaluation for details): ADL's;IADL's;Rest and Sleep;Play;Work;Leisure;Social Participation    Body Structure / Function / Physical Skills ADL;Coordination;Dexterity;Decreased knowledge of use of DME;Flexibility;ROM;UE functional use;Scar mobility;Muscle spasms;Sensation;Pain;Strength;IADL    Rehab Potential Fair    Clinical Decision Making Limited treatment options, no task modification necessary    Comorbidities Affecting  Occupational Performance: May have comorbidities impacting occupational performance    Modification or Assistance to Complete Evaluation  No modification of tasks or assist necessary to complete eval    OT Frequency 2x / week    OT Duration 8 weeks    OT Treatment/Interventions Self-care/ADL training;Moist Heat;Therapeutic exercise;Patient/family education;Splinting;Scar mobilization;DME and/or AE instruction;Manual Therapy;Passive range of motion    Consulted and Agree with Plan of Care Patient             Patient will benefit from skilled therapeutic intervention in order to improve the following deficits and impairments:   Body Structure / Function / Physical Skills: ADL, Coordination, Dexterity, Decreased knowledge of use of DME, Flexibility, ROM, UE functional use, Scar mobility, Muscle spasms, Sensation, Pain, Strength, IADL       Visit Diagnosis: Stiffness of right hand, not elsewhere classified  Stiffness of right wrist, not elsewhere classified  Muscle weakness (generalized)  Ulnar neuropathy at elbow of right upper extremity    Problem List Patient Active Problem List   Diagnosis Date Noted   S/P TKR (total knee replacement) using cement, left 04/12/2021   Cervical myelopathy (HCC) 01/13/2021   Seizure (HCC) 11/26/2020   Abnormal thyroid blood test 04/06/2016   Encounter for screening for HIV 01/05/2016   Abnormal weight gain 01/05/2016   Chronic prescription benzodiazepine use 10/19/2015   Elevated liver enzymes 08/20/2015   Medication monitoring encounter 08/20/2015   Hypokalemia 08/20/2015   Erectile dysfunction 04/22/2015   Status post hardware removal 04/20/2015   Bipolar 2 disorder (HCC) 07/29/2014   Marijuana abuse 07/29/2014   Chronic knee pain 07/29/2014   Bipolar 1 disorder, mixed (HCC)    Drug abuse, opioid type (HCC) 06/28/2013   GERD without esophagitis 10/03/2012   Hypertension goal BP (blood pressure) < 140/90 10/03/2012   Chronic pain  associated with significant psychosocial dysfunction 07/17/2012   Lumbar canal stenosis 04/09/2012   Hypercholesterolemia 01/06/2012   CN (constipation) 11/11/2011   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/2010   Decreased motor strength 09/30/2010   Current tobacco use 09/30/2010   H/O transient cerebral ischemia 09/09/2010   LBP (low back pain) 08/30/2010   Cervical spinal cord compression (HCC) 07/22/2010   Cervical post-laminectomy syndrome 07/22/2010   Chronic obstructive pulmonary disease (HCC) 02/23/2010    Oletta Cohn, OTR/L,CLT 01/31/2022, 10:40 AM  [facility] [address], [place], [address] Phone: [phone]   Fax:  [phone]  Name: Scott Howell MRN: [id number] Date of Birth: 25-Apr-1970

## 2022-02-03 ENCOUNTER — Ambulatory Visit: Payer: Medicaid Other | Admitting: Occupational Therapy

## 2022-02-03 DIAGNOSIS — M6281 Muscle weakness (generalized): Secondary | ICD-10-CM

## 2022-02-03 DIAGNOSIS — M25641 Stiffness of right hand, not elsewhere classified: Secondary | ICD-10-CM | POA: Diagnosis not present

## 2022-02-03 DIAGNOSIS — M25631 Stiffness of right wrist, not elsewhere classified: Secondary | ICD-10-CM

## 2022-02-03 DIAGNOSIS — G5621 Lesion of ulnar nerve, right upper limb: Secondary | ICD-10-CM

## 2022-02-03 NOTE — Therapy (Signed)
[facility] [address], [place], [address] Phone: [phone]   Fax:  [phone]  Occupational Therapy Treatment  Patient Details  Name: Scott Howell MRN: [id number] Date of Birth: [birth year] Referring Provider (OT): Dr Roland Rack   Encounter Date: 02/03/2022   OT End of Session - 02/03/22 0904     Visit Number 8    Number of Visits 16    Date for OT Re-Evaluation 03/22/22    OT Start Time 0901    OT Stop Time 0948    OT Time Calculation (min) 47 min    Activity Tolerance Patient tolerated treatment well    Behavior During Therapy [facility] for tasks assessed/performed             Past Medical History:  Diagnosis Date   Arthritis    Asthma    Bipolar 1 disorder ([place])    COPD (chronic obstructive pulmonary disease) ([place] [facility])    Drug abuse, opioid type ([place])    Dyspnea    Fibromyalgia    GERD (gastroesophageal reflux disease)    H/O ETOH abuse    Headache    High cholesterol    History of 2019 novel coronavirus disease (COVID-19) 11/01/2021   Homicidal ideation    Hypertension    Living accommodation issues    a.) as of 03/29/2021 --> living in camper   Marijuana use    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   Schizophrenia ([place])    Seizures ([place]) 11/2020   Stroke ([place])    no deficits   Tobacco use     Past Surgical History:  Procedure Laterality Date   ANTERIOR CERVICAL DECOMP/DISCECTOMY FUSION N/A 01/13/2021   Procedure: C3-5 ANTERIOR CERVICAL DECOMPRESSION/DISCECTOMY FUSION;  Surgeon: Meade Maw, MD;  Location: [facility] ORS;  Service: Neurosurgery;  Laterality: N/A;   CERVICAL SPINE SURGERY     4   CLAVICLE SURGERY     HARDWARE REMOVAL Left 04/20/2015   Procedure: HARDWARE REMOVAL left leg;  Surgeon: Earnestine Leys, MD;  Location: [facility] ORS;  Service: Orthopedics;  Laterality: Left;   KNEE SURGERY Left    NECK SURGERY     x6   SHOULDER SURGERY Left    TOTAL KNEE  ARTHROPLASTY Left 04/12/2021   Procedure: TOTAL KNEE ARTHROPLASTY;  Surgeon: Lovell Sheehan, MD;  Location: [facility] ORS;  Service: Orthopedics;  Laterality: Left;   ULNAR NERVE TRANSPOSITION Right 11/17/2021   Procedure: SUBCUTANEOUS TRANSPOSITION OF ULNAR NERVE, RIGHT ELBOW;  Surgeon: Corky Mull, MD;  Location: [facility] ORS;  Service: Orthopedics;  Laterality: Right;    There were no vitals filed for this visit.   Subjective Assessment - 02/03/22 0904     Subjective  Doing better -done laundry , making bed , cook and cleaned yesterday but then in the afternoon I had to take 2 pain pills- pain was from y elbow down to my hand -  feeling okay today pain only wrist into pinkie and ring finger    Pertinent History 11/28/21 seen by Ortho PA- Karren Cobble. is a 51 y.o. who presents today that is post right ulnar nerve transposition performed on 11/17/2021. Patient has discontinued wearing the sling and bracing that with applied at the time of surgery. He states he has had surgery on his neck and the sling was aggravating his neck. He has been working on range of motion of the elbow. His pleased with the results. Not  a lot of change in regards to numbness weakness of the upper extremity. He is complaining of a lot of pain to the arm.  Refer to PT but because of athrophy in R UE and neuropathy refer to OT - pt had C3-5 ANTERIOR CERVICAL DECOMPRESSION/DISCECTOMY FUSION 01/13/2021 Dr. Meade Maw at [facility],- as well as earlier this year Knee surgery- in past 5 cervical surgeries    Patient Stated Goals I want to be able to use my R arm and hand again to do my job as Horticulturist, commercial, take care of myself    Currently in Pain? Yes    Pain Score 4     Pain Location --   hand and 5th and 4th digits   Pain Orientation Right    Pain Descriptors / Indicators Aching;Tightness;Tingling;Burning    Pain Type Neuropathic pain;Surgical pain    Pain Onset More than a month ago                [facility] OT Assessment  - 02/03/22 0001       AROM   Overall AROM Comments WNL shoulder flexion , ABD    Right Elbow Flexion --   WNL   Right Elbow Extension -5    Right Forearm Pronation 90 Degrees    Right Forearm Supination 90 Degrees    Right Wrist Extension 75 Degrees    Right Wrist Flexion 52 Degrees    Right Wrist Radial Deviation --   keep at 30   Right Wrist Ulnar Deviation --   with gravity 5 degrees -and with 16 ozhammer more     Strength   Right Hand Grip (lbs) 26   wrist neutral   Right Hand Lateral Pinch 6 lbs    Right Hand 3 Point Pinch 4 lbs              measurement taken for ROM and strength in R UE         OT Treatments/Exercises (OP) - 02/03/22 0001       Moist Heat Therapy   Number Minutes Moist Heat 8 Minutes    Moist Heat Location Hand;Wrist;Elbow   inbetween exercises to decrease pain and spasms             Patient show increase shoulder abduction and flexion as well as horizontal abduction and adduction report increased strength and was able to do 2 pounds overhead did not count reps and sets just done it several times during the day feeling much stronger.   Feels pain is mostly on ulnar wrist into fifth and fourth digits.   Review and add to HEP GTB with large grip for shoulder flexion, ABD and horizontal ABD and ADD - simulating work activities - pt to start with 15 reps  And increase gradually - doing weight bearing inbetween GTB also for elbow extentoin double  Provided pt with BTB to use in week if doing 45 reps - with no increase symptoms         T    passive range of motion for ulnar deviation prior to using a 16 ounce hammer -for ulnar deviation to neutral over armrest as well as supination to neutral 2 sets of 10.  Can hold at bottom of hammer. With supination patient do need some weightbearing and passive range of motion in between sets. Hammer with head to ulnar side of hand - doing horizontal wrist extention using weight to maintain neutral  10reps  WB inbetween  Encourage patient to do weightbearing in between or moist heat on forearm and hand.   Hand exercises for thumb palmar radial abduction added rubber band 2 sets of 10.  And fifth digit   tendon glides for fingers staying the same.  Placed on hold from lumbrical to full extension. But can do grasp and release with forearm and ulnar side of hand on table around small cone. 3 sets of 5 reps with weightbearing in between do want to spasm into radial deviation. Show increase extension of fourth digit.  With increased sensation reported. Able to add rubber band place and hold for PIP extention of 3rd thru 4th - 8 reps   o Patient can use neoprene Benik wrap  during day and  wrist splint at night time to decrease RD contracture               OT Education - 02/03/22 0904     Education Details HEP with handout and splint use    Person(s) Educated Patient    Methods Explanation;Demonstration;Tactile cues;Verbal cues;Handout    Comprehension Verbal cues required;Returned demonstration;Verbalized understanding              OT Short Term Goals - 01/11/22 2019       OT SHORT TERM GOAL #1   Title Pt to be ind in use of R wrist splint and WB HEP to increase UD and wrist ext with digits extention    Baseline Kept wrist in 30 degrees of RD, pain and tightness , wrist drops into flexion with attmpts of digits extention - tightness mostly in flexors of hand and forearm    Time 3    Period Weeks    Status New    Target Date 02/01/22      OT SHORT TERM GOAL #2   Title R thumb AROM increase in all planes for pt to hold cup, pick up 1 cm objects and use utencils    Baseline R thumb flexion decrease - tightness in extensors- decrease PA  45 and RA  38- not able to use hand functional in ADL's    Time 5    Period Weeks    Status New    Target Date 02/15/22      OT SHORT TERM GOAL #3   Title R wrist  motion increase in all ranges including sup with pain less than  2/10 to turn doorknob, stabilize wrist neutral during gripping and lifting of objects    Baseline supination pain 4/10 ; wrist flex during attempts of gripping and during lifting or extension of digits - wrist drop into flexion    Time 6    Period Weeks    Status New    Target Date 02/22/22      OT SHORT TERM GOAL #4   Title R shoulder and elbow AROM increase to [facility] for pt use UE in ADL's with symptoms less than 3/10    Baseline spasm in arm with ABd shoulder to 90 , same with elbow extention in supination - end range limited ext -10 and pain increase to 6-8/10    Time 8    Period Weeks    Status New    Target Date 03/08/22                      Plan - 02/03/22 0905     Clinical Impression Statement Pt present 10 1/2  wks s/p R ulnar N transposition- but pt with  hx of 5 cervical surgeries with last one in Nov 22 after having seizures for few days in Nov 22 prior. Pt with significant atrophy and tightness in R dominant arm. At eval pt's houlder ABD to 90 and pain 6/10. Shoulder flexion WNL ext -10 and pain with end range. Wrist kept in 30 degrees of RD and thumb ADD- pain and tightness with attempts of PROM UD and thumb flexion. Flexion 40 and 60 extention. Pain with supination- unable to extend digits without flexion of wrist during attempt but PIP flexion tightness in 4th and 5th digits. Tinel at ulnar wrist reported. Pt cont to be very motivated in sessions to improve his ROM , strength and use of R dominant UE since his seizures in Nov followed by cervical surgery and now 11/17/21 R ulnar N transpostion.  Now pt show today R shoulder AROM WNL and elbow - able to do GTB HEP , wrist flexion, ext increase but still keeping wrist in RD . Supination WNL and increase strength with less pain. Pt show increase PIP extention of 4th more than 5th. Able to add weight to UD and wrist extenion but using gravity. Able to add resistance place and hold to 2nd , thru 4th PIP extention. Pain cont to  increase from 4-8/10 in session - pt need reminder not to over do exercises. To do heat or  weight bearing  to decrease spasm and pain inbetween ther ex. Ed pt to not push to point of pain but when feeling tightness prior to spasm - weight bear or use heat. Pt remains limited in use of R dominant UE in ADL's and IADL's and continues to benefit from skiled OT services to increase ROM, strength and function    OT Occupational Profile and History Problem Focused Assessment - Including review of records relating to presenting problem    Occupational performance deficits (Please refer to evaluation for details): ADL's;IADL's;Rest and Sleep;Play;Work;Leisure;Social Participation    Body Structure / Function / Physical Skills ADL;Coordination;Dexterity;Decreased knowledge of use of DME;Flexibility;ROM;UE functional use;Scar mobility;Muscle spasms;Sensation;Pain;Strength;IADL    Rehab Potential Fair    Clinical Decision Making Limited treatment options, no task modification necessary    Comorbidities Affecting Occupational Performance: May have comorbidities impacting occupational performance    Modification or Assistance to Complete Evaluation  No modification of tasks or assist necessary to complete eval    OT Frequency 2x / week    OT Duration 8 weeks    OT Treatment/Interventions Self-care/ADL training;Moist Heat;Therapeutic exercise;Patient/family education;Splinting;Scar mobilization;DME and/or AE instruction;Manual Therapy;Passive range of motion    Consulted and Agree with Plan of Care Patient             Patient will benefit from skilled therapeutic intervention in order to improve the following deficits and impairments:   Body Structure / Function / Physical Skills: ADL, Coordination, Dexterity, Decreased knowledge of use of DME, Flexibility, ROM, UE functional use, Scar mobility, Muscle spasms, Sensation, Pain, Strength, IADL       Visit Diagnosis: Stiffness of right hand, not elsewhere  classified  Stiffness of right wrist, not elsewhere classified  Muscle weakness (generalized)  Ulnar neuropathy at elbow of right upper extremity    Problem List Patient Active Problem List   Diagnosis Date Noted   S/P TKR (total knee replacement) using cement, left 04/12/2021   Cervical myelopathy ([facility]) 01/13/2021   Seizure ([facility]) 11/26/2020   Abnormal thyroid blood test 04/06/2016   Encounter for screening for HIV 01/05/2016   Abnormal weight gain  01/05/2016   Chronic prescription benzodiazepine use 10/19/2015   Elevated liver enzymes 08/20/2015   Medication monitoring encounter 08/20/2015   Hypokalemia 08/20/2015   Erectile dysfunction 04/22/2015   Status post hardware removal 04/20/2015   Bipolar 2 disorder ([place]) 07/29/2014   Marijuana abuse 07/29/2014   Chronic knee pain 07/29/2014   Bipolar 1 disorder, mixed ([place])    Drug abuse, opioid type ([place]) 06/28/2013   GERD without esophagitis 10/03/2012   Hypertension goal BP (blood pressure) < 140/90 10/03/2012   Chronic pain associated with significant psychosocial dysfunction 07/17/2012   Lumbar canal stenosis 04/09/2012   Hypercholesterolemia 01/06/2012   CN (constipation) 11/11/2011   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/2010   Decreased motor strength 09/30/2010   Current tobacco use 09/30/2010   H/O transient cerebral ischemia 09/09/2010   LBP (low back pain) 08/30/2010   Cervical spinal cord compression (HCC) 07/22/2010   Cervical post-laminectomy syndrome 07/22/2010   Chronic obstructive pulmonary disease ([place]) 02/23/2010    Rosalyn Gess, OTR/L,CLT 02/03/2022, 12:45 PM  [facility] [address], [place], [address] Phone: [phone]   Fax:  [phone]  Name: JIANCARLO LAUGHMAN MRN: [id number] Date of Birth: March 24, 1970

## 2022-02-08 ENCOUNTER — Ambulatory Visit: Payer: Medicaid Other | Admitting: Occupational Therapy

## 2022-02-10 ENCOUNTER — Ambulatory Visit: Payer: Medicaid Other | Admitting: Occupational Therapy

## 2022-02-15 ENCOUNTER — Ambulatory Visit: Payer: Medicaid Other | Attending: Surgery | Admitting: Occupational Therapy

## 2022-02-15 DIAGNOSIS — M6281 Muscle weakness (generalized): Secondary | ICD-10-CM | POA: Diagnosis present

## 2022-02-15 DIAGNOSIS — G5621 Lesion of ulnar nerve, right upper limb: Secondary | ICD-10-CM | POA: Diagnosis present

## 2022-02-15 DIAGNOSIS — M25631 Stiffness of right wrist, not elsewhere classified: Secondary | ICD-10-CM

## 2022-02-15 DIAGNOSIS — M25641 Stiffness of right hand, not elsewhere classified: Secondary | ICD-10-CM | POA: Insufficient documentation

## 2022-02-15 NOTE — Therapy (Signed)
[facility] [address], [place], [address] Phone: [phone]   Fax:  [phone]  Occupational Therapy Treatment  Patient Details  Name: Scott Howell MRN: [id number] Date of Birth: 06/21/1970 Referring Provider (OT): Dr Roland Rack   Encounter Date: 02/15/2022   OT End of Session - 02/15/22 1240     Visit Number 9    Number of Visits 16    Date for OT Re-Evaluation 03/22/22    OT Start Time [id number]    OT Stop Time 1228    OT Time Calculation (min) 44 min    Activity Tolerance Patient tolerated treatment well    Behavior During Therapy [facility] for tasks assessed/performed             Past Medical History:  Diagnosis Date   Arthritis    Asthma    Bipolar 1 disorder ([place])    COPD (chronic obstructive pulmonary disease) ([place])    Drug abuse, opioid type ([place])    Dyspnea    Fibromyalgia    GERD (gastroesophageal reflux disease)    H/O ETOH abuse    Headache    High cholesterol    History of 2019 novel coronavirus disease (COVID-19) 11/01/2021   Homicidal ideation    Hypertension    Living accommodation issues    a.) as of 03/29/2021 --> living in camper   Marijuana use    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   Schizophrenia ([place])    Seizures ([place]) 11/2020   Stroke ([place])    no deficits   Tobacco use     Past Surgical History:  Procedure Laterality Date   ANTERIOR CERVICAL DECOMP/DISCECTOMY FUSION N/A 01/13/2021   Procedure: C3-5 ANTERIOR CERVICAL DECOMPRESSION/DISCECTOMY FUSION;  Surgeon: Meade Maw, MD;  Location: [facility] ORS;  Service: Neurosurgery;  Laterality: N/A;   CERVICAL SPINE SURGERY     4   CLAVICLE SURGERY     HARDWARE REMOVAL Left 04/20/2015   Procedure: HARDWARE REMOVAL left leg;  Surgeon: Earnestine Leys, MD;  Location: [facility] ORS;  Service: Orthopedics;  Laterality: Left;   KNEE SURGERY Left    NECK SURGERY     x6   SHOULDER SURGERY Left    TOTAL KNEE ARTHROPLASTY  Left 04/12/2021   Procedure: TOTAL KNEE ARTHROPLASTY;  Surgeon: Lovell Sheehan, MD;  Location: [facility] ORS;  Service: Orthopedics;  Laterality: Left;   ULNAR NERVE TRANSPOSITION Right 11/17/2021   Procedure: SUBCUTANEOUS TRANSPOSITION OF ULNAR NERVE, RIGHT ELBOW;  Surgeon: Corky Mull, MD;  Location: [facility] ORS;  Service: Orthopedics;  Laterality: Right;    There were no vitals filed for this visit.   Subjective Assessment - 02/15/22 1236     Subjective  I have been doing 5 lbs weight for wrist and forearm - hammer was getting light - and also for shoulder and elbow - look my wrist and pinkie/ring finger is better-  Iam doing exercises probably 5 hrs day    Pertinent History 11/28/21 seen by Ortho PA- Karren Cobble. is a 52 y.o. who presents today that is post right ulnar nerve transposition performed on 11/17/2021. Patient has discontinued wearing the sling and bracing that with applied at the time of surgery. He states he has had surgery on his neck and the sling was aggravating his neck. He has been working on range of motion of the elbow. His pleased with the results. Not a lot of change in regards  to numbness weakness of the upper extremity. He is complaining of a lot of pain to the arm.  Refer to PT but because of athrophy in R UE and neuropathy refer to OT - pt had C3-5 ANTERIOR CERVICAL DECOMPRESSION/DISCECTOMY FUSION 01/13/2021 Dr. Meade Maw at [facility],- as well as earlier this year Knee surgery- in past 5 cervical surgeries    Patient Stated Goals I want to be able to use my R arm and hand again to do my job as Horticulturist, commercial, take care of myself    Currently in Pain? No/denies    Pain Score 4     Pain Location --   ulnar wrist and into hand   Pain Orientation Right    Pain Descriptors / Indicators Aching;Tightness;Burning    Pain Onset More than a month ago    Pain Frequency Intermittent               Patient arrive this date after not being seen for low but more than a  week.  Patient report he upgraded to 5 pounds for wrist and forearm as well as shoulders.  Patient able to maintain wrist neutral with gravity assist not going into radial deviation. Show increase wrist extension with fourth and fifth PIP extension. But continues to fatigue and spasm into radial deviation at the wrist and [place] at fourth and fifth PIP. Shoulder active range of motion in elbow as well as forearm within normal limits.  Increase strength patient able to do 5 pounds or green Thera-Band.  Upgraded patient to blue Thera-Band for shoulder D1-D2 pattern as well as shoulder extension and retraction. Patient to keep it pain-free can do 15 reps.         Numbness improving now in the DIP of the third PIP and distal on the fourth and fifth from distal palmar crease. Positive Tinel from wrist to distal palmar crease on the ulnar wrist. Most of his pain reported mostly also in that area.      passive range of motion for ulnar deviation prior to using 5 pound weight forfor ulnar deviation to neutral over armrest as well as supination to neutral -can do 20 reps pain-free. Patient needed correction to do wrist extension horizontal wrist extention using weight to maintain neutral 20 reps  WB inbetween  To be able to maintain wrist neutral.   Encourage patient to do weightbearing in between or moist heat on forearm and hand.   Hand exercises for thumb palmar radial abduction added rubber band 2 sets of 10.  Patient forgot to do rubber band exercises.  Showed decrease strength in endrange especially with palmar more than radial  tendon glides for fingers staying the same.  Placed on hold from lumbrical to full extension. But can do grasp and release with forearm and ulnar side of hand on table around small cone. 3 sets of 5 reps with weightbearing in between do want to spasm into radial deviation. Show increase extension of fourth and fifth digit.  With increased sensation  reported. Reviewed with patient keeping hand flat on the table doing passive range of motion radial deviation and weightbearing prior to sliding hand of table maintaining wrist in neutral and digit extension 5 seconds 3 reps.  Can repeat several times.  Reinforced quality more than quantity of motion Hold off on rubber band for PIP extension Patient also reports not wearing or needing wrist brace.  OT Education - 02/15/22 1240     Education Details progress and changes to HEP    Person(s) Educated Patient    Methods Explanation;Demonstration;Tactile cues;Verbal cues;Handout    Comprehension Verbal cues required;Returned demonstration;Verbalized understanding              OT Short Term Goals - 01/11/22 2019       OT SHORT TERM GOAL #1   Title Pt to be ind in use of R wrist splint and WB HEP to increase UD and wrist ext with digits extention    Baseline Kept wrist in 30 degrees of RD, pain and tightness , wrist drops into flexion with attmpts of digits extention - tightness mostly in flexors of hand and forearm    Time 3    Period Weeks    Status New    Target Date 02/01/22      OT SHORT TERM GOAL #2   Title R thumb AROM increase in all planes for pt to hold cup, pick up 1 cm objects and use utencils    Baseline R thumb flexion decrease - tightness in extensors- decrease PA  45 and RA  38- not able to use hand functional in ADL's    Time 5    Period Weeks    Status New    Target Date 02/15/22      OT SHORT TERM GOAL #3   Title R wrist  motion increase in all ranges including sup with pain less than 2/10 to turn doorknob, stabilize wrist neutral during gripping and lifting of objects    Baseline supination pain 4/10 ; wrist flex during attempts of gripping and during lifting or extension of digits - wrist drop into flexion    Time 6    Period Weeks    Status New    Target Date 02/22/22      OT SHORT TERM GOAL #4   Title R shoulder and elbow AROM  increase to [facility] for pt use UE in ADL's with symptoms less than 3/10    Baseline spasm in arm with ABd shoulder to 90 , same with elbow extention in supination - end range limited ext -10 and pain increase to 6-8/10    Time 8    Period Weeks    Status New    Target Date 03/08/22                      Plan - 02/15/22 1243     Clinical Impression Statement Pt present 12  wks s/p R ulnar N transposition- but pt with hx of 5 cervical surgeries with last one in Nov 22 after having seizures for few days in Nov 22 prior. Pt with significant atrophy and tightness in R dominant arm. At eval pt's houlder ABD to 90 and pain 6/10. Shoulder flexion WNL ext -10 and pain with end range. Wrist kept in 30 degrees of RD and thumb ADD- pain and tightness with attempts of PROM UD and thumb flexion. Flexion 40 and 60 extention. Pain with supination- unable to extend digits without flexion of wrist during attempt but PIP flexion tightness in 4th and 5th digits. Tinel at ulnar wrist reported. Pt cont to be very motivated in sessions to improve his ROM , strength and use of R dominant UE since his seizures in Nov followed by cervical surgery and now 11/17/21 R ulnar N transpostion.  Now pt show  R shoulder AROM WNL and elbow - able to  upgrade to BTB HEP , wrist flexion, ext  -with increase ability to maintain wrist neutral with gravity and even against for 3 reps . Supination WNL and increase strength using 5 lbs with less pain. Pt show increase PIP extention of 4th and 5th today. Able to  cont 5 lbs for wrist with PROM for UD and weight bearing inbetween. Still using gravity for UD. Able to cont place and hold for  wrist with PIP extention for 2nd thru 5th digits against gravity.  Pain cont to increase from 4-6/10 but able to decrease with rest- pt need reminder not to over do exercises. To do heat or  weight bearing/UD stretch  to decrease spasm and pain inbetween ther ex. Ed pt to not push to point of pain but when  feeling tightness prior to spasm - weight bear or use heat. Pt remains limited in use of R dominant UE in ADL's and IADL's and continues to benefit from skiled OT services to increase ROM, strength and function    OT Occupational Profile and History Problem Focused Assessment - Including review of records relating to presenting problem    Occupational performance deficits (Please refer to evaluation for details): ADL's;IADL's;Rest and Sleep;Play;Work;Leisure;Social Participation    Body Structure / Function / Physical Skills ADL;Coordination;Dexterity;Decreased knowledge of use of DME;Flexibility;ROM;UE functional use;Scar mobility;Muscle spasms;Sensation;Pain;Strength;IADL    Rehab Potential Fair    Clinical Decision Making Limited treatment options, no task modification necessary    Comorbidities Affecting Occupational Performance: May have comorbidities impacting occupational performance    Modification or Assistance to Complete Evaluation  No modification of tasks or assist necessary to complete eval    OT Frequency 2x / week    OT Duration 8 weeks    OT Treatment/Interventions Self-care/ADL training;Moist Heat;Therapeutic exercise;Patient/family education;Splinting;Scar mobilization;DME and/or AE instruction;Manual Therapy;Passive range of motion    Consulted and Agree with Plan of Care Patient             Patient will benefit from skilled therapeutic intervention in order to improve the following deficits and impairments:   Body Structure / Function / Physical Skills: ADL, Coordination, Dexterity, Decreased knowledge of use of DME, Flexibility, ROM, UE functional use, Scar mobility, Muscle spasms, Sensation, Pain, Strength, IADL       Visit Diagnosis: Stiffness of right hand, not elsewhere classified  Stiffness of right wrist, not elsewhere classified  Muscle weakness (generalized)  Ulnar neuropathy at elbow of right upper extremity    Problem List Patient Active Problem  List   Diagnosis Date Noted   S/P TKR (total knee replacement) using cement, left 04/12/2021   Cervical myelopathy ([place]) 01/13/2021   Seizure ([place]) 11/26/2020   Abnormal thyroid blood test 04/06/2016   Encounter for screening for HIV 01/05/2016   Abnormal weight gain 01/05/2016   Chronic prescription benzodiazepine use 10/19/2015   Elevated liver enzymes 08/20/2015   Medication monitoring encounter 08/20/2015   Hypokalemia 08/20/2015   Erectile dysfunction 04/22/2015   Status post hardware removal 04/20/2015   Bipolar 2 disorder ([place]) 07/29/2014   Marijuana abuse 07/29/2014   Chronic knee pain 07/29/2014   Bipolar 1 disorder, mixed ([place])    Drug abuse, opioid type ([place]) 06/28/2013   GERD without esophagitis 10/03/2012   Hypertension goal BP (blood pressure) < 140/90 10/03/2012   Chronic pain associated with significant psychosocial dysfunction 07/17/2012   Lumbar canal stenosis 04/09/2012   Hypercholesterolemia 01/06/2012   CN (constipation) 11/11/2011   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/2010   Decreased motor  strength 09/30/2010   Current tobacco use 09/30/2010   H/O transient cerebral ischemia 09/09/2010   LBP (low back pain) 08/30/2010   Cervical spinal cord compression ([facility]) 07/22/2010   Cervical post-laminectomy syndrome 07/22/2010   Chronic obstructive pulmonary disease ([place]) 02/23/2010    Rosalyn Gess, OTR/L,CLT 02/15/2022, 12:53 PM  [facility] [address] [place], [address] Phone: [phone]   Fax:  [phone]  Name: Scott Howell MRN: [id number] Date of Birth: 1971-01-23

## 2022-02-17 ENCOUNTER — Ambulatory Visit: Payer: Medicaid Other | Admitting: Occupational Therapy

## 2022-02-17 DIAGNOSIS — M25641 Stiffness of right hand, not elsewhere classified: Secondary | ICD-10-CM | POA: Diagnosis not present

## 2022-02-17 DIAGNOSIS — M25631 Stiffness of right wrist, not elsewhere classified: Secondary | ICD-10-CM

## 2022-02-17 DIAGNOSIS — M6281 Muscle weakness (generalized): Secondary | ICD-10-CM

## 2022-02-17 DIAGNOSIS — G5621 Lesion of ulnar nerve, right upper limb: Secondary | ICD-10-CM

## 2022-02-17 NOTE — Therapy (Signed)
[place] [facility] [address] [place], [place], [address] Phone: [phone]   Fax:  [phone]  Occupational Therapy Treatment  Patient Details  Name: Scott Howell MRN: [id number] Date of Birth: [birth year] Referring Provider (OT): Dr Joice Lofts   Encounter Date: 02/17/2022   OT End of Session - 02/17/22 1040     Visit Number 10    Number of Visits 16    Date for OT Re-Evaluation 03/22/22    OT Start Time 1030    OT Stop Time 1114    OT Time Calculation (min) 44 min    Activity Tolerance Patient tolerated treatment well    Behavior During Therapy [facility] for tasks assessed/performed             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   Schizophrenia (HCC)    Seizures (HCC) 11/2020   Stroke (HCC)    no deficits   Tobacco use     Past Surgical History:  Procedure Laterality Date   ANTERIOR CERVICAL DECOMP/DISCECTOMY FUSION N/A 01/13/2021   Procedure: C3-5 ANTERIOR CERVICAL DECOMPRESSION/DISCECTOMY FUSION;  Surgeon: Venetia Night, MD;  Location: [facility] ORS;  Service: Neurosurgery;  Laterality: N/A;   CERVICAL SPINE SURGERY     4   CLAVICLE SURGERY     HARDWARE REMOVAL Left 04/20/2015   Procedure: HARDWARE REMOVAL left leg;  Surgeon: Deeann Saint, MD;  Location: [facility] ORS;  Service: Orthopedics;  Laterality: Left;   KNEE SURGERY Left    NECK SURGERY     x6   SHOULDER SURGERY Left    TOTAL KNEE ARTHROPLASTY  Left 04/12/2021   Procedure: TOTAL KNEE ARTHROPLASTY;  Surgeon: Lyndle Herrlich, MD;  Location: [facility] ORS;  Service: Orthopedics;  Laterality: Left;   ULNAR NERVE TRANSPOSITION Right 11/17/2021   Procedure: SUBCUTANEOUS TRANSPOSITION OF ULNAR NERVE, RIGHT ELBOW;  Surgeon: Christena Flake, MD;  Location: [facility] ORS;  Service: Orthopedics;  Laterality: Right;    There were no vitals filed for this visit.   Subjective Assessment - 02/17/22 1038     Subjective  Just stiff this morning and some pain 4/10 at elbow to hand - where incision was -done my exercises already but use my arm a lot yesterday    Pertinent History 11/28/21 seen by Ortho PA- Jose Persia. is a 52 y.o. who presents today that is post right ulnar nerve transposition performed on 11/17/2021. Patient has discontinued wearing the sling and bracing that with applied at the time of surgery. He states he has had surgery on his neck and the sling was aggravating his neck. He has been working on range of motion of the elbow. His pleased with the results. Not a lot of change in regards to numbness weakness of the upper extremity. He is complaining of a lot  of pain to the arm.  Refer to PT but because of athrophy in R UE and neuropathy refer to OT - pt had C3-5 ANTERIOR CERVICAL DECOMPRESSION/DISCECTOMY FUSION 01/13/2021 Dr. 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 Yes    Pain Score 4     Pain Location --   elbow , wrist   Pain Orientation Right    Pain Descriptors / Indicators Aching;Tightness    Pain Type Neuropathic pain                      Patient arrived with reports of increased tightness today in the right hand because of cold weather and wind as well as using his a lot at home yesterday. Patient's Tinel at ulnar nerve  proximal to wrist.  Pain mostly also now her wrist and hand but if overusing it can be at elbow incision.    OT Treatments/Exercises (OP) - 02/17/22 0001       Moist Heat Therapy   Number Minutes Moist Heat 8 Minutes    Moist Heat Location --   hand , wrist to elbow           Patient this date using 5 pounds for wrist and forearm as well as shoulders.  Patient able to maintain wrist neutral with gravity assist not going into radial deviation. Show increase wrist extension with fourth and fifth PIP extension. But continues to fatigue and spasm into radial deviation at the wrist and [place] at fourth and fifth PIP. Shoulder active range of motion in elbow as well as forearm within normal limits.  Increase strength patient able to do 5 pounds or green Thera-Band.   Upgraded patient to blue Thera-Band for shoulder D1-D2 pattern as well as shoulder extension and retraction. Patient to keep it pain-free can do 15 reps. Done this date with patient 15 pounds chest breast as well as rowing 20 reps pain-free elbow extension 15 pounds bilaterally pushing down to thigh stopping at 90 degrees elbow flexion 15 reps. Also done trunk rotation with chest presses at 10 pounds left and right 2 sets of 20. Patient is requesting more weight but during session had increased spasm and pain at ulnar wrist hand and elbow. Reinforced again with patient not to overdo using heat in between to relax as well as weightbearing.                This week numbness improving now in the DIP of the third PIP and distal on the fourth and fifth from distal palmar crease. Positive Tinel from wrist to distal palmar crease on the ulnar wrist.        Reinforced for patient to do passive range of motion for ulnar deviation prior to using 5 pound weight forfor ulnar deviation to neutral over armrest as well as supination to neutral -can do 20 reps pain-free. Patient needed correction to do wrist extension horizontal wrist  extention using weight to maintain neutral 20 reps  WB inbetween  To be able to maintain wrist neutral.   Encourage patient to do weightbearing in between or moist heat on forearm and hand.   Hand exercises for thumb palmar radial abduction added rubber band 2 sets of 10.  Patient forgot to do rubber band exercises.  Showed decrease strength in endrange especially with palmar more than radial  tendon glides for fingers staying the same.  Placed on hold from lumbrical to full extension. But can do grasp and release with forearm and ulnar side of hand on table around small cone. 3 sets of 5 reps with weightbearing in between do want to spasm into radial deviation. Show increase extension of fourth and fifth digit.  With increased sensation reported. Reviewed with patient keeping hand flat on the table doing passive range of motion radial deviation and weightbearing prior to sliding hand of table maintaining wrist in neutral and digit extension 5 seconds 3 reps.  Can repeat several times.  Reinforced quality more than quantity of motion Hold off on rubber band for PIP extension Patient also reports not wearing or needing wrist brace.            OT Education - 02/17/22 1040     Education Details progress and changes to HEP    Person(s) Educated Patient    Methods Explanation;Demonstration;Tactile cues;Verbal cues;Handout    Comprehension Verbal cues required;Returned demonstration;Verbalized understanding              OT Short Term Goals - 01/11/22 2019       OT SHORT TERM GOAL #1   Title Pt to be ind in use of R wrist splint and WB HEP to increase UD and wrist ext with digits extention    Baseline Kept wrist in 30 degrees of RD, pain and tightness , wrist drops into flexion with attmpts of digits extention - tightness mostly in flexors of hand and forearm    Time 3    Period Weeks    Status New    Target Date 02/01/22      OT SHORT TERM GOAL #2   Title R thumb AROM  increase in all planes for pt to hold cup, pick up 1 cm objects and use utencils    Baseline R thumb flexion decrease - tightness in extensors- decrease PA  45 and RA  38- not able to use hand functional in ADL's    Time 5    Period Weeks    Status New    Target Date 02/15/22      OT SHORT TERM GOAL #3   Title R wrist  motion increase in all ranges including sup with pain less than 2/10 to turn doorknob, stabilize wrist neutral during gripping and lifting of objects    Baseline supination pain 4/10 ; wrist flex during attempts of gripping and during lifting or extension of digits - wrist drop into flexion    Time 6    Period Weeks    Status New    Target Date 02/22/22      OT SHORT TERM GOAL #4   Title R shoulder and elbow AROM increase to [facility] for pt use UE in ADL's with symptoms less than 3/10    Baseline spasm in arm with ABd shoulder to 90 , same with elbow extention in supination - end range limited ext -10 and pain increase to 6-8/10    Time 8    Period Weeks    Status New    Target Date 03/08/22                      Plan - 02/17/22 1041     Clinical Impression Statement Pt present 3 months s/p R ulnar N  transposition- but pt with hx of 5 cervical surgeries with last one in Nov 22 after having seizures for few days in Nov 22 prior. Pt with significant atrophy and tightness in R dominant arm.  Since start of care patient showed great progress in right shoulder flexion and abduction as well as elbow flexion and extension within normal limits with increased strength.  Tinel at ulnar wrist reported. Pt cont to be very motivated in sessions to improve his ROM , strength and use of R dominant UE since his seizures in Nov followed by cervical surgery and now 11/17/21 R ulnar N transpostion.  Patient this week showing  increase ability to maintain wrist neutral with gravity and even  for 3 reps . Supination WNL and increase strength using 5 lbs with less pain. Pt show increase PIP  extention of 4th and 5th this week.  Able to  cont 5 lbs for wrist with PROM for UD and weight bearing inbetween. Still using gravity for UD. Able to cont place and hold for  wrist with PIP extention for 2nd thru 5th digits against gravity.  Pain cont to increase from 4-6/10 but able to decrease with rest- pt need reminder not to over do exercises. To do heat or  weight bearing/UD stretch  to decrease spasm and pain inbetween ther ex. Ed pt to not push to point of pain but when feeling tightness prior to spasm - weight bear or use heat. Pt remains limited in use of R dominant UE in ADL's and IADL's and continues to benefit from skiled OT services to increase ROM, strength and function    OT Occupational Profile and History Problem Focused Assessment - Including review of records relating to presenting problem    Occupational performance deficits (Please refer to evaluation for details): ADL's;IADL's;Rest and Sleep;Play;Work;Leisure;Social Participation    Body Structure / Function / Physical Skills ADL;Coordination;Dexterity;Decreased knowledge of use of DME;Flexibility;ROM;UE functional use;Scar mobility;Muscle spasms;Sensation;Pain;Strength;IADL    Rehab Potential Fair    Clinical Decision Making Limited treatment options, no task modification necessary    Comorbidities Affecting Occupational Performance: May have comorbidities impacting occupational performance    Modification or Assistance to Complete Evaluation  No modification of tasks or assist necessary to complete eval    OT Frequency 2x / week    OT Duration 8 weeks    OT Treatment/Interventions Self-care/ADL training;Moist Heat;Therapeutic exercise;Patient/family education;Splinting;Scar mobilization;DME and/or AE instruction;Manual Therapy;Passive range of motion    Consulted and Agree with Plan of Care Patient             Patient will benefit from skilled therapeutic intervention in order to improve the following deficits and  impairments:   Body Structure / Function / Physical Skills: ADL, Coordination, Dexterity, Decreased knowledge of use of DME, Flexibility, ROM, UE functional use, Scar mobility, Muscle spasms, Sensation, Pain, Strength, IADL       Visit Diagnosis: Stiffness of right hand, not elsewhere classified  Muscle weakness (generalized)  Ulnar neuropathy at elbow of right upper extremity  Stiffness of right wrist, not elsewhere classified    Problem List Patient Active Problem List   Diagnosis Date Noted   S/P TKR (total knee replacement) using cement, left 04/12/2021   Cervical myelopathy ([place]) 01/13/2021   Seizure ([place]) 11/26/2020   Abnormal thyroid blood test 04/06/2016   Encounter for screening for HIV 01/05/2016   Abnormal weight gain 01/05/2016   Chronic prescription benzodiazepine use 10/19/2015   Elevated liver enzymes 08/20/2015   Medication monitoring encounter  08/20/2015   Hypokalemia 08/20/2015   Erectile dysfunction 04/22/2015   Status post hardware removal 04/20/2015   Bipolar 2 disorder ([place]) 07/29/2014   Marijuana abuse 07/29/2014   Chronic knee pain 07/29/2014   Bipolar 1 disorder, mixed ([place])    Drug abuse, opioid type ([place]) 06/28/2013   GERD without esophagitis 10/03/2012   Hypertension goal BP (blood pressure) < 140/90 10/03/2012   Chronic pain associated with significant psychosocial dysfunction 07/17/2012   Lumbar canal stenosis 04/09/2012   Hypercholesterolemia 01/06/2012   CN (constipation) 11/11/2011   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/2010   Decreased motor strength 09/30/2010   Current tobacco use 09/30/2010   H/O transient cerebral ischemia 09/09/2010   LBP (low back pain) 08/30/2010   Cervical spinal cord compression ([place]) 07/22/2010   Cervical post-laminectomy syndrome 07/22/2010   Chronic obstructive pulmonary disease ([place]) 02/23/2010    Rosalyn Gess, OTR?l,CLT 02/17/2022, 7:30 PM  [place] PHYSICAL AND  SPORTS MEDICINE [address] [place], [address] Phone: [phone]   Fax:  [phone]  Name: UVALDO RYBACKI MRN: [id number] Date of Birth: 08/23/1970

## 2022-02-22 ENCOUNTER — Other Ambulatory Visit: Payer: Self-pay | Admitting: Internal Medicine

## 2022-02-22 ENCOUNTER — Ambulatory Visit: Payer: Medicaid Other | Admitting: Occupational Therapy

## 2022-02-22 DIAGNOSIS — G5621 Lesion of ulnar nerve, right upper limb: Secondary | ICD-10-CM

## 2022-02-22 DIAGNOSIS — K219 Gastro-esophageal reflux disease without esophagitis: Secondary | ICD-10-CM

## 2022-02-22 DIAGNOSIS — M25641 Stiffness of right hand, not elsewhere classified: Secondary | ICD-10-CM

## 2022-02-22 DIAGNOSIS — M25631 Stiffness of right wrist, not elsewhere classified: Secondary | ICD-10-CM

## 2022-02-22 DIAGNOSIS — J449 Chronic obstructive pulmonary disease, unspecified: Secondary | ICD-10-CM

## 2022-02-22 DIAGNOSIS — I10 Essential (primary) hypertension: Secondary | ICD-10-CM

## 2022-02-22 DIAGNOSIS — E7800 Pure hypercholesterolemia, unspecified: Secondary | ICD-10-CM

## 2022-02-22 DIAGNOSIS — M6281 Muscle weakness (generalized): Secondary | ICD-10-CM

## 2022-02-22 MED ORDER — ALBUTEROL SULFATE HFA 108 (90 BASE) MCG/ACT IN AERS: INHALATION_SPRAY | RESPIRATORY_TRACT | 2 refills | Status: DC

## 2022-02-22 MED ORDER — FAMOTIDINE 20 MG PO TABS: 20.0000 mg | ORAL_TABLET | Freq: Two times a day (BID) | ORAL | 1 refills | Status: DC

## 2022-02-22 NOTE — Telephone Encounter (Signed)
Requested Prescriptions  Pending Prescriptions Disp Refills   albuterol (VENTOLIN HFA) 108 (90 Base) MCG/ACT inhaler 18 each 2    Sig: INHALE 2 PUFFS INTO LUNGS EVERY 6 HOURS AS NEEDED FOR WHEEZING     Pulmonology:  Beta Agonists 2 Passed - 02/22/2022  1:08 PM      Passed - Last BP in normal range    BP Readings from Last 1 Encounters:  12/02/21 130/76         Passed - Last Heart Rate in normal range    Pulse Readings from Last 1 Encounters:  12/02/21 90         Passed - Valid encounter within last 12 months    Recent Outpatient Visits           2 months ago Hypertension goal BP (blood pressure) < 140/90   [facility] Teodora Medici, DO   3 months ago [facility] Teodora Medici, DO   7 months ago [place], DO   10 months ago Hypertension goal BP (blood pressure) < 140/90   [place], DO   1 year ago Hypertension goal BP (blood pressure) < 140/90   [place], DO       Future Appointments             In 3 months Teodora Medici, [facility]             QUEtiapine (SEROQUEL) 100 MG tablet      Sig: Take by mouth.     Not Delegated - Psychiatry:  Antipsychotics - Second Generation (Atypical) - quetiapine Failed - 02/22/2022  1:08 PM      Failed - This refill cannot be delegated      Failed - TSH in normal range and within 360 days    TSH  Date Value Ref Range Status  12/16/2020 0.70 0.40 - 4.50 mIU/L Final         Failed - Lipid Panel in normal range within the last 12 months    Cholesterol  Date Value Ref Range Status  12/16/2020 195 <200 mg/dL Final   LDL Cholesterol (Calc)  Date Value Ref Range Status  12/16/2020 134 (H) mg/dL (calc) Final    Comment:    Reference range: <100 . Desirable range <100 mg/dL for primary prevention;   <70  mg/dL for patients with CHD or diabetic patients  with > or = 2 CHD risk factors. Marland Kitchen LDL-C is now calculated using the Martin-Hopkins  calculation, which is a validated novel method providing  better accuracy than the Friedewald equation in the  estimation of LDL-C.  Cresenciano Genre et al. Annamaria Helling. 4098;119(14): 2061-2068  (http://education.[employer].com/faq/FAQ164)    HDL  Date Value Ref Range Status  12/16/2020 32 (L) > OR = 40 mg/dL Final   Triglycerides  Date Value Ref Range Status  12/16/2020 157 (H) <150 mg/dL Final         Failed - CBC within normal limits and completed in the last 12 months    WBC  Date Value Ref Range Status  11/12/2021 9.5 4.0 - 10.5 K/uL Final   RBC  Date Value Ref Range Status  11/12/2021 5.22 4.22 - 5.81 MIL/uL Final   Hemoglobin  Date Value Ref Range Status  11/12/2021 16.2 13.0 - 17.0 g/dL Final   HGB  Date Value Ref Range  Status  06/05/2014 14.7 13.0 - 18.0 g/dL Final   HCT  Date Value Ref Range Status  11/12/2021 47.3 39.0 - 52.0 % Final  06/05/2014 41.8 40.0 - 52.0 % Final   MCHC  Date Value Ref Range Status  11/12/2021 34.2 30.0 - 36.0 g/dL Final   [facility]  Date Value Ref Range Status  11/12/2021 31.0 26.0 - 34.0 pg Final   MCV  Date Value Ref Range Status  11/12/2021 90.6 80.0 - 100.0 fL Final  06/05/2014 88 80 - 100 fL Final   No results found for: "PLTCOUNTKUC", "LABPLAT", "POCPLA" RDW  Date Value Ref Range Status  11/12/2021 13.2 11.5 - 15.5 % Final  06/05/2014 13.4 11.5 - 14.5 % Final         Failed - CMP within normal limits and completed in the last 12 months    Albumin  Date Value Ref Range Status  11/25/2020 4.5 3.5 - 5.0 g/dL Final  15/06/6977 4.5 g/dL Final    Comment:    4.8-0.1 NOTE: New reference range  04/22/14    Alkaline Phosphatase  Date Value Ref Range Status  11/25/2020 49 38 - 126 U/L Final  06/05/2014 55 U/L Final    Comment:    38-126 NOTE: New Reference Range  04/22/14    Alkaline  phosphatase (APISO)  Date Value Ref Range Status  12/16/2020 57 35 - 144 U/L Final   ALT  Date Value Ref Range Status  12/16/2020 17 9 - 46 U/L Final   SGPT (ALT)  Date Value Ref Range Status  06/05/2014 30 U/L Final    Comment:    17-63 NOTE: New Reference Range  04/22/14    AST  Date Value Ref Range Status  12/16/2020 23 10 - 35 U/L Final   SGOT(AST)  Date Value Ref Range Status  06/05/2014 38 U/L Final    Comment:    15-41 NOTE: New Reference Range  04/22/14    BUN  Date Value Ref Range Status  11/12/2021 10 6 - 20 mg/dL Final  65/53/7482 5 (L) mg/dL Final    Comment:    7-07 NOTE: New Reference Range  04/22/14    Calcium  Date Value Ref Range Status  11/12/2021 9.6 8.9 - 10.3 mg/dL Final   Calcium, Total  Date Value Ref Range Status  06/05/2014 9.3 mg/dL Final    Comment:    8.6-75.4 NOTE: New Reference Range  04/22/14    CO2  Date Value Ref Range Status  11/12/2021 29 22 - 32 mmol/L Final   Co2  Date Value Ref Range Status  06/05/2014 27 mmol/L Final    Comment:    22-32 NOTE: New Reference Range  04/22/14    Bicarbonate  Date Value Ref Range Status  11/26/2020 25.4 20.0 - 28.0 mmol/L Final   Creat  Date Value Ref Range Status  12/16/2020 0.77 0.70 - 1.30 mg/dL Final   Creatinine, Ser  Date Value Ref Range Status  11/12/2021 0.80 0.61 - 1.24 mg/dL Final   Glucose  Date Value Ref Range Status  06/05/2014 92 mg/dL Final    Comment:    49-20 NOTE: New Reference Range  04/22/14    Glucose, Bld  Date Value Ref Range Status  11/12/2021 93 70 - 99 mg/dL Final    Comment:    Glucose reference range applies only to samples taken after fasting for at least 8 hours.   Glucose-Capillary  Date Value Ref Range Status  11/27/2020 119 (H)  70 - 99 mg/dL Final    Comment:    Glucose reference range applies only to samples taken after fasting for at least 8 hours.   Potassium  Date Value Ref Range Status  11/12/2021 4.0 3.5 - 5.1  mmol/L Final  06/08/2014 3.7 mmol/L Final    Comment:    3.5-5.1 NOTE: New Reference Range  04/22/14    Sodium  Date Value Ref Range Status  11/12/2021 139 135 - 145 mmol/L Final  06/05/2014 135 mmol/L Final    Comment:    135-145 NOTE: New Reference Range  04/22/14    Total Bilirubin  Date Value Ref Range Status  12/16/2020 0.4 0.2 - 1.2 mg/dL Final   Bilirubin,Total  Date Value Ref Range Status  06/05/2014 0.7 mg/dL Final    Comment:    9.1-4.7 NOTE: New Reference Range  04/22/14    Protein, ur  Date Value Ref Range Status  03/29/2021 NEGATIVE NEGATIVE mg/dL Final   Total Protein  Date Value Ref Range Status  12/16/2020 7.0 6.1 - 8.1 g/dL Final  82/95/6213 7.8 g/dL Final    Comment:    0.8-6.5 NOTE: New Reference Range  04/22/14    GFR, Est African American  Date Value Ref Range Status  01/05/2016 >89 >=60 mL/min Final   GFR calc Af Amer  Date Value Ref Range Status  12/24/2016 >60 >60 mL/min Final    Comment:    (NOTE) The eGFR has been calculated using the CKD EPI equation. This calculation has not been validated in all clinical situations. eGFR's persistently <60 mL/min signify possible Chronic Kidney Disease.    eGFR  Date Value Ref Range Status  12/16/2020 109 > OR = 60 mL/min/1.50m2 Final    Comment:    The eGFR is based on the CKD-EPI 2021 equation. To calculate  the new eGFR from a previous Creatinine or Cystatin C result, go to https://www.kidney.org/professionals/ kdoqi/gfr%5Fcalculator    GFR, Est Non African American  Date Value Ref Range Status  01/05/2016 >89 >=60 mL/min Final   GFR, Estimated  Date Value Ref Range Status  11/12/2021 >60 >60 mL/min Final    Comment:    (NOTE) Calculated using the CKD-EPI Creatinine Equation (2021)          Passed - Last BP in normal range    BP Readings from Last 1 Encounters:  12/02/21 130/76         Passed - Last Heart Rate in normal range    Pulse Readings from Last 1  Encounters:  12/02/21 90         Passed - Valid encounter within last 6 months    Recent Outpatient Visits           2 months ago Hypertension goal BP (blood pressure) < 140/90   [facility] Margarita Mail, DO   3 months ago COVID-19   [facility] Margarita Mail, DO   7 months ago Trauma   [facility] Margarita Mail, DO   10 months ago Hypertension goal BP (blood pressure) < 140/90   [facility] Margarita Mail, DO   1 year ago Hypertension goal BP (blood pressure) < 140/90   [facility] Margarita Mail, DO       Future Appointments             In 3 months Margarita Mail, DO [facility]  famotidine (PEPCID) 20 MG tablet 180 tablet 1    Sig: Take 1 tablet (20 mg total) by mouth 2 (two) times daily.     Gastroenterology:  H2 Antagonists Passed - 02/22/2022  1:08 PM      Passed - Valid encounter within last 12 months    Recent Outpatient Visits           2 months ago Hypertension goal BP (blood pressure) < 140/90   [facility] Margarita Mail, DO   3 months ago COVID-19   [facility] Margarita Mail, DO   7 months ago Trauma   [facility] Margarita Mail, DO   10 months ago Hypertension goal BP (blood pressure) < 140/90   [facility] Margarita Mail, DO   1 year ago Hypertension goal BP (blood pressure) < 140/90   [facility] Margarita Mail, DO       Future Appointments             In 3 months Margarita Mail, DO [facility], [facility]

## 2022-02-22 NOTE — Telephone Encounter (Signed)
Requested medication (s) are due for refill today: yes  Requested medication (s) are on the active medication list: yes  Last refill:  11/12/21   Future visit scheduled: yes  Notes to clinic:  historical med, Unable to refill per protocol, cannot delegate.      Requested Prescriptions  Pending Prescriptions Disp Refills   QUEtiapine (SEROQUEL) 100 MG tablet      Sig: Take by mouth.     Not Delegated - Psychiatry:  Antipsychotics - Second Generation (Atypical) - quetiapine Failed - 02/22/2022  1:08 PM      Failed - This refill cannot be delegated      Failed - TSH in normal range and within 360 days    TSH  Date Value Ref Range Status  12/16/2020 0.70 0.40 - 4.50 mIU/L Final         Failed - Lipid Panel in normal range within the last 12 months    Cholesterol  Date Value Ref Range Status  12/16/2020 195 <200 mg/dL Final   LDL Cholesterol (Calc)  Date Value Ref Range Status  12/16/2020 134 (H) mg/dL (calc) Final    Comment:    Reference range: <100 . Desirable range <100 mg/dL for primary prevention;   <70 mg/dL for patients with CHD or diabetic patients  with > or = 2 CHD risk factors. Marland Kitchen LDL-C is now calculated using the Martin-Hopkins  calculation, which is a validated novel method providing  better accuracy than the Friedewald equation in the  estimation of LDL-C.  Horald Pollen et al. Lenox Ahr. 1761;607(37): 2061-2068  (http://education.[facility].com/faq/FAQ164)    HDL  Date Value Ref Range Status  12/16/2020 32 (L) > OR = 40 mg/dL Final   Triglycerides  Date Value Ref Range Status  12/16/2020 157 (H) <150 mg/dL Final         Failed - CBC within normal limits and completed in the last 12 months    WBC  Date Value Ref Range Status  11/12/2021 9.5 4.0 - 10.5 K/uL Final   RBC  Date Value Ref Range Status  11/12/2021 5.22 4.22 - 5.81 MIL/uL Final   Hemoglobin  Date Value Ref Range Status  11/12/2021 16.2 13.0 - 17.0 g/dL Final   HGB  Date Value Ref  Range Status  06/05/2014 14.7 13.0 - 18.0 g/dL Final   HCT  Date Value Ref Range Status  11/12/2021 47.3 39.0 - 52.0 % Final  06/05/2014 41.8 40.0 - 52.0 % Final   MCHC  Date Value Ref Range Status  11/12/2021 34.2 30.0 - 36.0 g/dL Final   [facility]  Date Value Ref Range Status  11/12/2021 31.0 26.0 - 34.0 pg Final   MCV  Date Value Ref Range Status  11/12/2021 90.6 80.0 - 100.0 fL Final  06/05/2014 88 80 - 100 fL Final   No results found for: "PLTCOUNTKUC", "LABPLAT", "POCPLA" RDW  Date Value Ref Range Status  11/12/2021 13.2 11.5 - 15.5 % Final  06/05/2014 13.4 11.5 - 14.5 % Final         Failed - CMP within normal limits and completed in the last 12 months    Albumin  Date Value Ref Range Status  11/25/2020 4.5 3.5 - 5.0 g/dL Final  10/62/6948 4.5 g/dL Final    Comment:    5.4-6.2 NOTE: New reference range  04/22/14    Alkaline Phosphatase  Date Value Ref Range Status  11/25/2020 49 38 - 126 U/L Final  06/05/2014 55 U/L Final  Comment:    38-126 NOTE: New Reference Range  04/22/14    Alkaline phosphatase (APISO)  Date Value Ref Range Status  12/16/2020 57 35 - 144 U/L Final   ALT  Date Value Ref Range Status  12/16/2020 17 9 - 46 U/L Final   SGPT (ALT)  Date Value Ref Range Status  06/05/2014 30 U/L Final    Comment:    17-63 NOTE: New Reference Range  04/22/14    AST  Date Value Ref Range Status  12/16/2020 23 10 - 35 U/L Final   SGOT(AST)  Date Value Ref Range Status  06/05/2014 38 U/L Final    Comment:    15-41 NOTE: New Reference Range  04/22/14    BUN  Date Value Ref Range Status  11/12/2021 10 6 - 20 mg/dL Final  06/05/2014 5 (L) mg/dL Final    Comment:    6-20 NOTE: New Reference Range  04/22/14    Calcium  Date Value Ref Range Status  11/12/2021 9.6 8.9 - 10.3 mg/dL Final   Calcium, Total  Date Value Ref Range Status  06/05/2014 9.3 mg/dL Final    Comment:    8.9-10.3 NOTE: New Reference Range  04/22/14     CO2  Date Value Ref Range Status  11/12/2021 29 22 - 32 mmol/L Final   Co2  Date Value Ref Range Status  06/05/2014 27 mmol/L Final    Comment:    22-32 NOTE: New Reference Range  04/22/14    Bicarbonate  Date Value Ref Range Status  11/26/2020 25.4 20.0 - 28.0 mmol/L Final   Creat  Date Value Ref Range Status  12/16/2020 0.77 0.70 - 1.30 mg/dL Final   Creatinine, Ser  Date Value Ref Range Status  11/12/2021 0.80 0.61 - 1.24 mg/dL Final   Glucose  Date Value Ref Range Status  06/05/2014 92 mg/dL Final    Comment:    65-99 NOTE: New Reference Range  04/22/14    Glucose, Bld  Date Value Ref Range Status  11/12/2021 93 70 - 99 mg/dL Final    Comment:    Glucose reference range applies only to samples taken after fasting for at least 8 hours.   Glucose-Capillary  Date Value Ref Range Status  11/27/2020 119 (H) 70 - 99 mg/dL Final    Comment:    Glucose reference range applies only to samples taken after fasting for at least 8 hours.   Potassium  Date Value Ref Range Status  11/12/2021 4.0 3.5 - 5.1 mmol/L Final  06/08/2014 3.7 mmol/L Final    Comment:    3.5-5.1 NOTE: New Reference Range  04/22/14    Sodium  Date Value Ref Range Status  11/12/2021 139 135 - 145 mmol/L Final  06/05/2014 135 mmol/L Final    Comment:    135-145 NOTE: New Reference Range  04/22/14    Total Bilirubin  Date Value Ref Range Status  12/16/2020 0.4 0.2 - 1.2 mg/dL Final   Bilirubin,Total  Date Value Ref Range Status  06/05/2014 0.7 mg/dL Final    Comment:    0.3-1.2 NOTE: New Reference Range  04/22/14    Protein, ur  Date Value Ref Range Status  03/29/2021 NEGATIVE NEGATIVE mg/dL Final   Total Protein  Date Value Ref Range Status  12/16/2020 7.0 6.1 - 8.1 g/dL Final  06/05/2014 7.8 g/dL Final    Comment:    6.5-8.1 NOTE: New Reference Range  04/22/14    GFR, Est African American  Date  Value Ref Range Status  01/05/2016 >89 >=60 mL/min Final   GFR  calc Af Amer  Date Value Ref Range Status  12/24/2016 >60 >60 mL/min Final    Comment:    (NOTE) The eGFR has been calculated using the CKD EPI equation. This calculation has not been validated in all clinical situations. eGFR's persistently <60 mL/min signify possible Chronic Kidney Disease.    eGFR  Date Value Ref Range Status  12/16/2020 109 > OR = 60 mL/min/1.66m2 Final    Comment:    The eGFR is based on the CKD-EPI 2021 equation. To calculate  the new eGFR from a previous Creatinine or Cystatin C result, go to https://www.kidney.org/professionals/ kdoqi/gfr%5Fcalculator    GFR, Est Non African American  Date Value Ref Range Status  01/05/2016 >89 >=60 mL/min Final   GFR, Estimated  Date Value Ref Range Status  11/12/2021 >60 >60 mL/min Final    Comment:    (NOTE) Calculated using the CKD-EPI Creatinine Equation (2021)          Passed - Last BP in normal range    BP Readings from Last 1 Encounters:  12/02/21 130/76         Passed - Last Heart Rate in normal range    Pulse Readings from Last 1 Encounters:  12/02/21 90         Passed - Valid encounter within last 6 months    Recent Outpatient Visits           2 months ago Hypertension goal BP (blood pressure) < 140/90   [facility] Margarita Mail, DO   3 months ago COVID-19   [facility] Margarita Mail, DO   7 months ago Trauma   [facility] Margarita Mail, DO   10 months ago Hypertension goal BP (blood pressure) < 140/90   [facility] Margarita Mail, DO   1 year ago Hypertension goal BP (blood pressure) < 140/90   [facility] Margarita Mail, DO       Future Appointments             In 3 months Margarita Mail, DO [facility]            Signed Prescriptions Disp Refills   albuterol (VENTOLIN HFA) 108 (90 Base) MCG/ACT inhaler 18 each 2    Sig:  INHALE 2 PUFFS INTO LUNGS EVERY 6 HOURS AS NEEDED FOR WHEEZING     Pulmonology:  Beta Agonists 2 Passed - 02/22/2022  1:08 PM      Passed - Last BP in normal range    BP Readings from Last 1 Encounters:  12/02/21 130/76         Passed - Last Heart Rate in normal range    Pulse Readings from Last 1 Encounters:  12/02/21 90         Passed - Valid encounter within last 12 months    Recent Outpatient Visits           2 months ago Hypertension goal BP (blood pressure) < 140/90   [facility] Margarita Mail, DO   3 months ago COVID-19   [facility] Margarita Mail, DO   7 months ago Trauma   [facility] Margarita Mail, DO   10 months ago Hypertension goal BP (blood pressure) < 140/90   [facility] Margarita Mail, DO   1 year ago Hypertension  goal BP (blood pressure) < 140/90   [facility] Margarita Mail, DO       Future Appointments             In 3 months Margarita Mail, DO [facility]             famotidine (PEPCID) 20 MG tablet 180 tablet 1    Sig: Take 1 tablet (20 mg total) by mouth 2 (two) times daily.     Gastroenterology:  H2 Antagonists Passed - 02/22/2022  1:08 PM      Passed - Valid encounter within last 12 months    Recent Outpatient Visits           2 months ago Hypertension goal BP (blood pressure) < 140/90   [facility] Margarita Mail, DO   3 months ago COVID-19   [facility] Margarita Mail, DO   7 months ago Trauma   [facility] Margarita Mail, DO   10 months ago Hypertension goal BP (blood pressure) < 140/90   [facility] Margarita Mail, DO   1 year ago Hypertension goal BP (blood pressure) < 140/90   [facility] Margarita Mail, DO       Future Appointments             In 3 months  Margarita Mail, DO [facility], [facility]

## 2022-02-22 NOTE — Therapy (Signed)
[facility] [address], [place], [address] Phone: [phone]   Fax:  [phone]  Occupational Therapy Treatment  Patient Details  Name: Scott Howell MRN: [id number] Date of Birth: [birth year] Referring Provider (OT): Dr Roland Rack   Encounter Date: 02/22/2022   OT End of Session - 02/22/22 1547     Visit Number 11    Number of Visits 16    Date for OT Re-Evaluation 03/22/22    OT Start Time 0816    OT Stop Time 0900    OT Time Calculation (min) 44 min    Activity Tolerance Patient tolerated treatment well    Behavior During Therapy [facility] for tasks assessed/performed             Past Medical History:  Diagnosis Date   Arthritis    Asthma    Bipolar 1 disorder ([place])    COPD (chronic obstructive pulmonary disease) ([place])    Drug abuse, opioid type ([place])    Dyspnea    Fibromyalgia    GERD (gastroesophageal reflux disease)    H/O ETOH abuse    Headache    High cholesterol    History of 2019 novel coronavirus disease (COVID-19) 11/01/2021   Homicidal ideation    Hypertension    Living accommodation issues    a.) as of 03/29/2021 --> living in camper   Marijuana use    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   Schizophrenia ([place])    Seizures ([place]) 11/2020   Stroke ([place])    no deficits   Tobacco use     Past Surgical History:  Procedure Laterality Date   ANTERIOR CERVICAL DECOMP/DISCECTOMY FUSION N/A 01/13/2021   Procedure: C3-5 ANTERIOR CERVICAL DECOMPRESSION/DISCECTOMY FUSION;  Surgeon: Meade Maw, MD;  Location: [facility] ORS;  Service: Neurosurgery;  Laterality: N/A;   CERVICAL SPINE SURGERY     4   CLAVICLE SURGERY     HARDWARE REMOVAL Left 04/20/2015   Procedure: HARDWARE REMOVAL left leg;  Surgeon: Earnestine Leys, MD;  Location: [facility] ORS;  Service: Orthopedics;  Laterality: Left;   KNEE SURGERY Left    NECK SURGERY     x6   SHOULDER SURGERY Left    TOTAL KNEE ARTHROPLASTY  Left 04/12/2021   Procedure: TOTAL KNEE ARTHROPLASTY;  Surgeon: Lovell Sheehan, MD;  Location: [facility] ORS;  Service: Orthopedics;  Laterality: Left;   ULNAR NERVE TRANSPOSITION Right 11/17/2021   Procedure: SUBCUTANEOUS TRANSPOSITION OF ULNAR NERVE, RIGHT ELBOW;  Surgeon: Corky Mull, MD;  Location: [facility] ORS;  Service: Orthopedics;  Laterality: Right;    There were no vitals filed for this visit.   Subjective Assessment - 02/22/22 1546     Subjective  I am trying to use it more and do all your exercises - but sometimes I push myself to much and then the wrist/hand hurts- I take pain medications late afternoon and then before bed time    Pertinent History 11/28/21 seen by Ortho PA- Karren Cobble. is a 52 y.o. who presents today that is post right ulnar nerve transposition performed on 11/17/2021. Patient has discontinued wearing the sling and bracing that with applied at the time of surgery. He states he has had surgery on his neck and the sling was aggravating his neck. He has been working on range of motion of the elbow. His pleased with the results. Not a lot of change in regards to numbness weakness of  the upper extremity. He is complaining of a lot of pain to the arm.  Refer to PT but because of athrophy in R UE and neuropathy refer to OT - pt had C3-5 ANTERIOR CERVICAL DECOMPRESSION/DISCECTOMY FUSION 01/13/2021 Dr. Meade Maw at [facility],- as well as earlier this year Knee surgery- in past 5 cervical surgeries    Patient Stated Goals I want to be able to use my R arm and hand again to do my job as Horticulturist, commercial, take care of myself    Currently in Pain? Yes    Pain Score 4     Pain Location --   ulnar wrist to hand   Pain Orientation Right    Pain Descriptors / Indicators Aching;Tightness    Pain Type Neuropathic pain    Pain Onset More than a month ago                [place] OT Assessment - 02/22/22 0001       AROM   Right Elbow Flexion 150    Right Elbow Extension 0     Right Forearm Pronation 90 Degrees    Right Forearm Supination 90 Degrees    Right Wrist Extension 70 Degrees   hand in fist , open 35 -keep in RD   Right Wrist Flexion 65 Degrees   into RD   Right Wrist Radial Deviation --   keeping 25 RD-   Right Wrist Ulnar Deviation --   can get to neutral with gravity - 15 degrees with 1 lbs into UD     Strength   Right Hand Grip (lbs) 35    Right Hand Lateral Pinch 6 lbs    Right Hand 3 Point Pinch 4 lbs    Left Hand Grip (lbs) 105    Left Hand Lateral Pinch 21 lbs    Left Hand 3 Point Pinch 8 lbs               Reassessed patient's shoulder, elbow, wrist and forearm strength and range of motion.  As well as digit range of motion and strength in grip and prehension. See flowsheet. Patient shoulder and elbow active range of motion within normal limits with strength 4+/5 Pain improved but still when patient overdoing activities can increase. Most pain at elbow if overdoing task and ulnar wrist and hand. Spent time this date to educate patient on nerve healing and anatomy. Patient at this point improved greatly up to about forearm wrist.  Limited mostly in extensor carpi ulnaris Digit extension at fourth and fifth with fifth worse but improving. Patient able to maintain wrist neutral during grip testing. But still dropping into radial deviation against gravity. Able to maintain wrist in neutral with gravity working on fisting and extension of fingers.  Can do 6-8 reps with fifth digit MCP and PIP extension -30 to -35 degrees Thumb palmar radial abduction doing great doing strengthening Thumb radial abduction still tight but able to do some strengthening. Fine motor and opposition still limited but improving  Per pt numbness improving now in the DIP of the third PIP and distal on the fourth and fifth from distal palmar crease. Positive Tinel from wrist to distal palmar crease on the ulnar wrist.        Cont same HEP - decrease to 1 x  wl Reinforced for patient to do passive range of motion for ulnar deviation prior to using 5 pound weight forfor ulnar deviation to neutral over armrest as well as supination  to neutral -can do 20 reps pain-free. Patient needed correction to do wrist extension horizontal wrist extention using weight to maintain neutral 20 reps- need reminder again today   WB inbetween  To be able to maintain wrist neutral.   Encourage patient to do weightbearing in between or moist heat on forearm and hand.   Hand exercises for thumb palmar radial abduction added rubber band 2 sets of 10.  Showed decrease strength in endrange e  tendon glides for fingers staying the same.  Placed on hold from lumbrical to full extension.    Show increase extension of fourth and fifth digit.  With increased sensation reported. Pt can do ABD and ADD of digits And  ADD of 5th - all sliding on paper  .  Reinforced quality more than quantity of motion Wrist splint on with tasks                   OT Education - 02/22/22 1547     Education Details progress and changes to HEP, nerve healing    Person(s) Educated Patient    Methods Explanation;Demonstration;Tactile cues;Verbal cues;Handout    Comprehension Verbal cues required;Returned demonstration;Verbalized understanding              OT Short Term Goals - 02/22/22 1810       OT SHORT TERM GOAL #1   Title Pt to be ind in use of R wrist splint and WB HEP to increase UD and wrist ext with digits extention    Baseline Kept wrist in 30 degrees of RD, pain and tightness , wrist drops into flexion with attmpts of digits extention - tightness mostly in flexors of hand and forearm NOW pt able to maintain wrist neutral with grip ,and digits extention to -30 to -35 at 5th MC and PIP. Wrist still drop into RD 30 degrees- wearing splint mostly when doing tasks around the house    Time 4    Period Weeks    Status On-going    Target Date 03/22/22      OT SHORT TERM  GOAL #2   Title R thumb AROM increase in all planes for pt to hold cup, pick up 1 cm objects and use utencils    Baseline R thumb flexion decrease - tightness in extensors- decrease PA  45 and RA  38- not able to use hand functional in ADL's NOW pt show increase PA to WNL, RA still tight but increase after WB - able to pick up objects and release - smaller objects stil hard    Time 4    Period Weeks    Status On-going    Target Date 03/22/22      OT SHORT TERM GOAL #3   Title R wrist  motion increase in all ranges including sup with pain less than 2/10 to turn doorknob, stabilize wrist neutral during gripping and lifting of objects    Baseline supination pain 4/10 ; wrist flex during attempts of gripping and during lifting or extension of digits - wrist drop into flexion  NOW supination no pain except if over doing HEP , can do 5 lbs , can maintain wrist neutral with assessment of grip; Wrist dropping into RD against gravity - but can do 6-8 reps digits extention with wrist neutral wiht gravity    Time 4    Period Weeks    Status On-going    Target Date 03/22/22      OT SHORT TERM GOAL #4  Title R shoulder and elbow AROM increase to [facility] for pt use UE in ADL's with symptoms less than 3/10    Baseline Shoulder and elbow AROM WNL and strengty 4+/5 - still increase symptoms at times when overdoing act at home    Status Achieved                      Plan - 02/22/22 1547     Clinical Impression Statement Pt present 3 months s/p R ulnar N transposition 11/17/21- but pt with hx of 5 cervical surgeries with last one in Nov 22 after having seizures for few days in Nov 22 prior. Pt with significant atrophy and tightness in R dominant arm.  Since start of care patient showed great progress in right shoulder flexion and abduction as well as elbow flexion and extension within normal limits with increased 5+/5 strength.  Tinel at ulnar wrist/ hand reported. Pt cont to be very motivated in  sessions to improve his ROM , strength and use of R dominant UE to return to work.  Pt this past 1-2 wks able to maintain wrist neutral with gravity. As well as increase of 6-8 reps making fist and opening digits- 5th -30 to -35 extention at [facility] and PIP.  Supination WNL and increase strength using 5 lbs with less pain but still show RD of wrist.   Able to  cont 5 lbs for wrist with PROM for UD and weight bearing for forearm flexors stretch. Still using gravity for UD.   Pain cont to increase from 4-6/10 but able to decrease with rest, heat and WB in session- pt need reminder not to over do exercises.  Pt remains limited in use of R dominant UE mostly from forearm to hand in ADL's and IADL's and continues to benefit from skiled OT services to increase ROM, strength and function. Pt education done this date about nerve healing and anatomy.    OT Occupational Profile and History Problem Focused Assessment - Including review of records relating to presenting problem    Occupational performance deficits (Please refer to evaluation for details): ADL's;IADL's;Rest and Sleep;Play;Work;Leisure;Social Participation    Body Structure / Function / Physical Skills ADL;Coordination;Dexterity;Decreased knowledge of use of DME;Flexibility;ROM;UE functional use;Scar mobility;Muscle spasms;Sensation;Pain;Strength;IADL    Rehab Potential Fair    Clinical Decision Making Limited treatment options, no task modification necessary    Comorbidities Affecting Occupational Performance: May have comorbidities impacting occupational performance    Modification or Assistance to Complete Evaluation  No modification of tasks or assist necessary to complete eval    OT Frequency 1x / week    OT Duration 8 weeks    OT Treatment/Interventions Self-care/ADL training;Moist Heat;Therapeutic exercise;Patient/family education;Splinting;Scar mobilization;DME and/or AE instruction;Manual Therapy;Passive range of motion    Consulted and Agree with  Plan of Care Patient             Patient will benefit from skilled therapeutic intervention in order to improve the following deficits and impairments:   Body Structure / Function / Physical Skills: ADL, Coordination, Dexterity, Decreased knowledge of use of DME, Flexibility, ROM, UE functional use, Scar mobility, Muscle spasms, Sensation, Pain, Strength, IADL       Visit Diagnosis: Stiffness of right hand, not elsewhere classified  Ulnar neuropathy at elbow of right upper extremity  Stiffness of right wrist, not elsewhere classified  Muscle weakness (generalized)    Problem List Patient Active Problem List   Diagnosis Date Noted   S/P TKR (total  knee replacement) using cement, left 04/12/2021   Cervical myelopathy ([place]) 01/13/2021   Seizure ([place]) 11/26/2020   Abnormal thyroid blood test 04/06/2016   Encounter for screening for HIV 01/05/2016   Abnormal weight gain 01/05/2016   Chronic prescription benzodiazepine use 10/19/2015   Elevated liver enzymes 08/20/2015   Medication monitoring encounter 08/20/2015   Hypokalemia 08/20/2015   Erectile dysfunction 04/22/2015   Status post hardware removal 04/20/2015   Bipolar 2 disorder ([place]) 07/29/2014   Marijuana abuse 07/29/2014   Chronic knee pain 07/29/2014   Bipolar 1 disorder, mixed ([place])    Drug abuse, opioid type ([place]) 06/28/2013   GERD without esophagitis 10/03/2012   Hypertension goal BP (blood pressure) < 140/90 10/03/2012   Chronic pain associated with significant psychosocial dysfunction 07/17/2012   Lumbar canal stenosis 04/09/2012   Hypercholesterolemia 01/06/2012   CN (constipation) 11/11/2011   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/2010   Decreased motor strength 09/30/2010   Current tobacco use 09/30/2010   H/O transient cerebral ischemia 09/09/2010   LBP (low back pain) 08/30/2010   Cervical spinal cord compression (H[facility]) 07/22/2010   Cervical post-laminectomy syndrome 07/22/2010   Chronic obstructive  pulmonary disease ([place]) 02/23/2010    Rosalyn Gess, OTR/L,CLT 02/22/2022, 6:16 PM  [facility] [address] [place], [address] Phone: [phone]   Fax:  [phone]  Name: Scott Howell MRN: [id number] Date of Birth: [birth year]

## 2022-02-22 NOTE — Telephone Encounter (Signed)
Medication Refill - Medication: QUEtiapine (SEROQUEL) 100 MG tablet , famotidine (PEPCID) 20 MG tablet, albuterol (VENTOLIN HFA) 108 (90 Base) MCG/ACT inhaler   Has the patient contacted their pharmacy? Yes.     Preferred Pharmacy (with phone number or street name):  [id number] - [place], [place] Phone: [phone]  Fax: [phone]     Has the patient been seen for an appointment in the last year OR does the patient have an upcoming appointment? Yes.    Please assist patient further

## 2022-02-22 NOTE — Telephone Encounter (Signed)
Original Rx sent 12/02/21- marked as no print- Remaining RF sent Requested Prescriptions  Pending Prescriptions Disp Refills   lisinopril (ZESTRIL) 10 MG tablet [Pharmacy Med Name: LISINOPRIL 10 MG TABLET] 90 tablet 1    Sig: TAKE 1 TABLET BY MOUTH EVERY DAY     Cardiovascular:  ACE Inhibitors Passed - 02/22/2022 11:00 AM      Passed - Cr in normal range and within 180 days    Creat  Date Value Ref Range Status  12/16/2020 0.77 0.70 - 1.30 mg/dL Final   Creatinine, Ser  Date Value Ref Range Status  11/12/2021 0.80 0.61 - 1.24 mg/dL Final         Passed - K in normal range and within 180 days    Potassium  Date Value Ref Range Status  11/12/2021 4.0 3.5 - 5.1 mmol/L Final  06/08/2014 3.7 mmol/L Final    Comment:    3.5-5.1 NOTE: New Reference Range  04/22/14          Passed - Patient is not pregnant      Passed - Last BP in normal range    BP Readings from Last 1 Encounters:  12/02/21 130/76         Passed - Valid encounter within last 6 months    Recent Outpatient Visits           2 months ago Hypertension goal BP (blood pressure) < 140/90   [facility] Margarita Mail, DO   3 months ago COVID-19   [facility] Margarita Mail, DO   7 months ago Trauma   [facility] Margarita Mail, DO   10 months ago Hypertension goal BP (blood pressure) < 140/90   [facility] Margarita Mail, DO   1 year ago Hypertension goal BP (blood pressure) < 140/90   [facility] Margarita Mail, DO       Future Appointments             In 3 months Margarita Mail, DO [facility]             atorvastatin (LIPITOR) 20 MG tablet [Pharmacy Med Name: ATORVASTATIN 20 MG TABLET] 90 tablet 1    Sig: TAKE 1 TABLET BY MOUTH EVERY DAY     Cardiovascular:  Antilipid - Statins Failed - 02/22/2022 11:00 AM      Failed - Lipid Panel in normal range  within the last 12 months    Cholesterol  Date Value Ref Range Status  12/16/2020 195 <200 mg/dL Final   LDL Cholesterol (Calc)  Date Value Ref Range Status  12/16/2020 134 (H) mg/dL (calc) Final    Comment:    Reference range: <100 . Desirable range <100 mg/dL for primary prevention;   <70 mg/dL for patients with CHD or diabetic patients  with > or = 2 CHD risk factors. Marland Kitchen LDL-C is now calculated using the Martin-Hopkins  calculation, which is a validated novel method providing  better accuracy than the Friedewald equation in the  estimation of LDL-C.  Horald Pollen et al. Lenox Ahr. 6468;032(12): 2061-2068  (http://education.[employer].com/faq/FAQ164)    HDL  Date Value Ref Range Status  12/16/2020 32 (L) > OR = 40 mg/dL Final   Triglycerides  Date Value Ref Range Status  12/16/2020 157 (H) <150 mg/dL Final         Passed - Patient is not pregnant      Passed - Valid encounter within  last 12 months    Recent Outpatient Visits           2 months ago Hypertension goal BP (blood pressure) < 140/90   [facility] Teodora Medici, DO   3 months ago [facility] Teodora Medici, DO   7 months ago [facility] Teodora Medici, DO   10 months ago Hypertension goal BP (blood pressure) < 140/90   [place], DO   1 year ago Hypertension goal BP (blood pressure) < 140/90   [facility] Teodora Medici, DO       Future Appointments             In 3 months Teodora Medici, [facility], [facility]

## 2022-02-24 ENCOUNTER — Ambulatory Visit: Payer: Medicaid Other | Admitting: Occupational Therapy

## 2022-02-25 MED ORDER — QUETIAPINE FUMARATE 100 MG PO TABS: 100.0000 mg | ORAL_TABLET | Freq: Every day | ORAL | 0 refills | Status: DC

## 2022-03-01 ENCOUNTER — Ambulatory Visit: Payer: Medicaid Other | Admitting: Occupational Therapy

## 2022-03-01 DIAGNOSIS — M25641 Stiffness of right hand, not elsewhere classified: Secondary | ICD-10-CM

## 2022-03-01 DIAGNOSIS — M6281 Muscle weakness (generalized): Secondary | ICD-10-CM

## 2022-03-01 DIAGNOSIS — G5621 Lesion of ulnar nerve, right upper limb: Secondary | ICD-10-CM

## 2022-03-01 DIAGNOSIS — M25631 Stiffness of right wrist, not elsewhere classified: Secondary | ICD-10-CM

## 2022-03-01 NOTE — Therapy (Signed)
[facility] [address] [place], [place], [address] Phone: [phone]   Fax:  [phone]  Occupational Therapy Treatment  Patient Details  Name: Scott Howell MRN: [id number] Date of Birth: 02/16/1970 Referring Provider (OT): Dr Joice Lofts   Encounter Date: 03/01/2022   OT End of Session - 03/01/22 1438     Visit Number 12    Number of Visits 16    Date for OT Re-Evaluation 03/22/22    OT Start Time 1050    OT Stop Time 1123    OT Time Calculation (min) 33 min    Activity Tolerance Patient tolerated treatment well    Behavior During Therapy [facility] for tasks assessed/performed             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   Schizophrenia (HCC)    Seizures (HCC) 11/2020   Stroke (HCC)    no deficits   Tobacco use     Past Surgical History:  Procedure Laterality Date   ANTERIOR CERVICAL DECOMP/DISCECTOMY FUSION N/A 01/13/2021   Procedure: C3-5 ANTERIOR CERVICAL DECOMPRESSION/DISCECTOMY FUSION;  Surgeon: Venetia Night, MD;  Location: [facility] ORS;  Service: Neurosurgery;  Laterality: N/A;   CERVICAL SPINE SURGERY     4   CLAVICLE SURGERY     HARDWARE REMOVAL Left 04/20/2015   Procedure: HARDWARE REMOVAL left leg;  Surgeon: Deeann Saint, MD;  Location: [facility] ORS;  Service: Orthopedics;  Laterality: Left;   KNEE SURGERY Left    NECK SURGERY     x6   SHOULDER SURGERY Left    TOTAL KNEE  ARTHROPLASTY Left 04/12/2021   Procedure: TOTAL KNEE ARTHROPLASTY;  Surgeon: Lyndle Herrlich, MD;  Location: [facility] ORS;  Service: Orthopedics;  Laterality: Left;   ULNAR NERVE TRANSPOSITION Right 11/17/2021   Procedure: SUBCUTANEOUS TRANSPOSITION OF ULNAR NERVE, RIGHT ELBOW;  Surgeon: Christena Flake, MD;  Location: [facility] ORS;  Service: Orthopedics;  Laterality: Right;    There were no vitals filed for this visit.   Subjective Assessment - 03/01/22 1437     Subjective  This week was not as great - with being cold and wet - but I am doing my exercises - what ever you tell me -I do    Pertinent History 11/28/21 seen by Ortho PA- Jose Persia. is a 52 y.o. who presents today that is post right ulnar nerve transposition performed on 11/17/2021. Patient has discontinued wearing the sling and bracing that with applied at the time of surgery. He states he has had surgery on his neck and the sling was aggravating his neck. He has been working on range of motion of the elbow. His pleased with the results. Not a lot of change in regards to numbness weakness of the upper extremity. He is complaining of a lot  of pain to the arm.  Refer to PT but because of athrophy in R UE and neuropathy refer to OT - pt had C3-5 ANTERIOR CERVICAL DECOMPRESSION/DISCECTOMY FUSION 01/13/2021 Dr. Meade Maw at [facility],- as well as earlier this year Knee surgery- in past 5 cervical surgeries    Patient Stated Goals I want to be able to use my R arm and hand again to do my job as Horticulturist, commercial, take care of myself    Currently in Pain? No/denies                          OT Treatments/Exercises (OP) - 03/01/22 0001       Moist Heat Therapy   Number Minutes Moist Heat 8 Minutes    Moist Heat Location Hand;Wrist;Elbow             Limited mostly in extensor carpi ulnaris Digit extension at fourth and fifth with fifth worse but improving. Patient able to maintain wrist neutral during grip testing. But  still dropping into radial deviation against gravity. Able to maintain wrist in neutral with gravity working on fisting and extension of fingers.  Can do 10 reps with fifth digit MCP and PIP extension 20 to -25 degrees Thumb palmar radial abduction doing great doing strengthening Thumb radial abduction still tight but able to do some strengthening. Fine motor and opposition still limited but improving   Per pt numbness improving now in the DIP of the third PIP and distal on the fourth and fifth from distal palmar crease. Positive Tinel from wrist to distal palmar crease on the ulnar wrist.        Cont same HEP - decrease to 1 x wl Reinforced for patient to do passive range of motion for ulnar deviation prior to using 5 pound weight forfor ulnar deviation to neutral over armrest as well as supination to neutral -can do 20 reps pain-free. Add isometric for wrist UD -  Patient needed correction to do wrist extension horizontal wrist extention using weight  but need cueing to stay in neutral  position - 20 reps- need reminder again today   WB inbetween     Encourage patient to do weightbearing in between or moist heat on forearm and hand.   Hand exercises for thumb palmar radial abduction added rubber band 2 sets of 10.  Showed decrease strength in endrange e  tendon glides for fingers staying the same.  Placed on hold from lumbrical to full extension. Add light blue putty for gripping and lat grip with rolling digits extention inbetween 15 reps  3 point pinch done 3 x 5 reps - WB inbetween if showing increase flexion or spasm on 2nd PIP      .  Reinforced quality more than quantity of motion Wrist splint on with tasks                 OT Education - 03/01/22 1438     Education Details progress and changes to HEP, nerve healing    Person(s) Educated Patient    Methods Explanation;Demonstration;Tactile cues;Verbal cues;Handout    Comprehension Verbal cues required;Returned  demonstration;Verbalized understanding              OT Short Term Goals - 02/22/22 1810       OT SHORT TERM GOAL #1   Title Pt to be ind in use of R wrist splint and WB HEP to increase UD and wrist ext with digits  extention    Baseline Kept wrist in 30 degrees of RD, pain and tightness , wrist drops into flexion with attmpts of digits extention - tightness mostly in flexors of hand and forearm NOW pt able to maintain wrist neutral with grip ,and digits extention to -30 to -35 at 5th MC and PIP. Wrist still drop into RD 30 degrees- wearing splint mostly when doing tasks around the house    Time 4    Period Weeks    Status On-going    Target Date 03/22/22      OT SHORT TERM GOAL #2   Title R thumb AROM increase in all planes for pt to hold cup, pick up 1 cm objects and use utencils    Baseline R thumb flexion decrease - tightness in extensors- decrease PA  45 and RA  38- not able to use hand functional in ADL's NOW pt show increase PA to WNL, RA still tight but increase after WB - able to pick up objects and release - smaller objects stil hard    Time 4    Period Weeks    Status On-going    Target Date 03/22/22      OT SHORT TERM GOAL #3   Title R wrist  motion increase in all ranges including sup with pain less than 2/10 to turn doorknob, stabilize wrist neutral during gripping and lifting of objects    Baseline supination pain 4/10 ; wrist flex during attempts of gripping and during lifting or extension of digits - wrist drop into flexion  NOW supination no pain except if over doing HEP , can do 5 lbs , can maintain wrist neutral with assessment of grip; Wrist dropping into RD against gravity - but can do 6-8 reps digits extention with wrist neutral wiht gravity    Time 4    Period Weeks    Status On-going    Target Date 03/22/22      OT SHORT TERM GOAL #4   Title R shoulder and elbow AROM increase to [facility] for pt use UE in ADL's with symptoms less than 3/10    Baseline Shoulder  and elbow AROM WNL and strengty 4+/5 - still increase symptoms at times when overdoing act at home    Status Achieved                      Plan - 03/01/22 1439     Clinical Impression Statement Pt present 3 1/2 months s/p R ulnar N transposition 11/17/21- but pt with hx of 5 cervical surgeries with last one in Nov 22 after having seizures for few days in Nov 22 prior. Pt with significant atrophy and tightness in R dominant arm.  Since start of care patient showed great progress in right shoulder flexion and abduction as well as elbow flexion and extension within normal limits with increased 5+/5 strength.  Tinel at ulnar wrist/ hand reported. Pt cont to be very motivated in sessions to improve his ROM , strength and use of R dominant UE to return to work.  Pt this past 1-2 wks able to maintain wrist neutral with gravity. As well as increase  now to 10 reps making fist and opening digits extention - 5th - 20 to 25 extention at [facility] and PIP.  Supination WNL and increase strength using 5 lbs with less pain but still show RD of wrist.   Able to  cont 5 lbs for wrist with PROM for UD  and weight bearing for forearm flexors stretch. Still using gravity for UD but add isometric for UD.   Pain cont to increase from 4-6/10 mostly now distally at wrist and hand -  but able to decrease with rest, heat and WB in session- pt need reminder not to over do exercises.  Pt remains limited in use of R dominant UE mostly from forearm to hand in ADL's and IADL's and continues to benefit from skiled OT services to increase ROM, strength and function. Pt education done this date about nerve healing and anatomy.    OT Occupational Profile and History Problem Focused Assessment - Including review of records relating to presenting problem    Occupational performance deficits (Please refer to evaluation for details): ADL's;IADL's;Rest and Sleep;Play;Work;Leisure;Social Participation    Body Structure / Function / Physical  Skills ADL;Coordination;Dexterity;Decreased knowledge of use of DME;Flexibility;ROM;UE functional use;Scar mobility;Muscle spasms;Sensation;Pain;Strength;IADL    Rehab Potential Fair    Clinical Decision Making Limited treatment options, no task modification necessary    Comorbidities Affecting Occupational Performance: May have comorbidities impacting occupational performance    Modification or Assistance to Complete Evaluation  No modification of tasks or assist necessary to complete eval    OT Frequency 1x / week    OT Duration 8 weeks    OT Treatment/Interventions Self-care/ADL training;Moist Heat;Therapeutic exercise;Patient/family education;Splinting;Scar mobilization;DME and/or AE instruction;Manual Therapy;Passive range of motion    Consulted and Agree with Plan of Care Patient             Patient will benefit from skilled therapeutic intervention in order to improve the following deficits and impairments:   Body Structure / Function / Physical Skills: ADL, Coordination, Dexterity, Decreased knowledge of use of DME, Flexibility, ROM, UE functional use, Scar mobility, Muscle spasms, Sensation, Pain, Strength, IADL       Visit Diagnosis: Stiffness of right hand, not elsewhere classified  Stiffness of right wrist, not elsewhere classified  Muscle weakness (generalized)  Ulnar neuropathy at elbow of right upper extremity    Problem List Patient Active Problem List   Diagnosis Date Noted   S/P TKR (total knee replacement) using cement, left 04/12/2021   Cervical myelopathy (HCC) 01/13/2021   Seizure (HCC) 11/26/2020   Abnormal thyroid blood test 04/06/2016   Encounter for screening for HIV 01/05/2016   Abnormal weight gain 01/05/2016   Chronic prescription benzodiazepine use 10/19/2015   Elevated liver enzymes 08/20/2015   Medication monitoring encounter 08/20/2015   Hypokalemia 08/20/2015   Erectile dysfunction 04/22/2015   Status post hardware removal 04/20/2015    Bipolar 2 disorder (HCC) 07/29/2014   Marijuana abuse 07/29/2014   Chronic knee pain 07/29/2014   Bipolar 1 disorder, mixed (HCC)    Drug abuse, opioid type (HCC) 06/28/2013   GERD without esophagitis 10/03/2012   Hypertension goal BP (blood pressure) < 140/90 10/03/2012   Chronic pain associated with significant psychosocial dysfunction 07/17/2012   Lumbar canal stenosis 04/09/2012   Hypercholesterolemia 01/06/2012   CN (constipation) 11/11/2011   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/2010   Decreased motor strength 09/30/2010   Current tobacco use 09/30/2010   H/O transient cerebral ischemia 09/09/2010   LBP (low back pain) 08/30/2010   Cervical spinal cord compression (HCC) 07/22/2010   Cervical post-laminectomy syndrome 07/22/2010   Chronic obstructive pulmonary disease (HCC) 02/23/2010    Oletta Cohn, OTR/L,CLT 03/01/2022, 2:41 PM  [facility] [address], [place], [address] Phone: [phone]   Fax:  [phone]  Name: Scott Howell MRN: [id number] Date of Birth: 15-Jun-1970

## 2022-03-08 ENCOUNTER — Ambulatory Visit: Payer: Medicaid Other | Admitting: Occupational Therapy

## 2022-03-08 DIAGNOSIS — M6281 Muscle weakness (generalized): Secondary | ICD-10-CM

## 2022-03-08 DIAGNOSIS — G5621 Lesion of ulnar nerve, right upper limb: Secondary | ICD-10-CM

## 2022-03-08 DIAGNOSIS — M25641 Stiffness of right hand, not elsewhere classified: Secondary | ICD-10-CM

## 2022-03-08 DIAGNOSIS — M25631 Stiffness of right wrist, not elsewhere classified: Secondary | ICD-10-CM

## 2022-03-08 NOTE — Therapy (Signed)
[facility] [address] [place], [place], [address] Phone: [phone]   Fax:  [phone]  Occupational Therapy Treatment  Patient Details  Name: Scott Howell MRN: [id number] Date of Birth: 18-May-1970 Referring Provider (OT): Dr Joice Lofts   Encounter Date: 03/08/2022   OT End of Session - 03/08/22 1112     Visit Number 13    Number of Visits 16    Date for OT Re-Evaluation 03/22/22    OT Start Time 1032    OT Stop Time 1110    OT Time Calculation (min) 38 min    Activity Tolerance Patient tolerated treatment well    Behavior During Therapy [facility] for tasks assessed/performed             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   Schizophrenia (HCC)    Seizures (HCC) 11/2020   Stroke (HCC)    no deficits   Tobacco use     Past Surgical History:  Procedure Laterality Date   ANTERIOR CERVICAL DECOMP/DISCECTOMY FUSION N/A 01/13/2021   Procedure: C3-5 ANTERIOR CERVICAL DECOMPRESSION/DISCECTOMY FUSION;  Surgeon: Venetia Night, MD;  Location: [facility] ORS;  Service: Neurosurgery;  Laterality: N/A;   CERVICAL SPINE SURGERY     4   CLAVICLE SURGERY     HARDWARE REMOVAL Left 04/20/2015   Procedure: HARDWARE REMOVAL left leg;  Surgeon: Deeann Saint, MD;  Location: [facility];  Service: Orthopedics;  Laterality: Left;   KNEE SURGERY Left    NECK SURGERY     x6   SHOULDER SURGERY Left    TOTAL KNEE ARTHROPLASTY Left  04/12/2021   Procedure: TOTAL KNEE ARTHROPLASTY;  Surgeon: Lyndle Herrlich, MD;  Location: [facility] OR[facility];  Service: Orthopedics;  Laterality: Left;   ULNAR NERVE TRANSPOSITION Right 11/17/2021   Procedure: SUBCUTANEOUS TRANSPOSITION OF ULNAR NERVE, RIGHT ELBOW;  Surgeon: Christena Flake, MD;  Location: [facility] OR[facility];  Service: Orthopedics;  Laterality: Right;    There were no vitals filed for this visit.   Subjective Assessment - 03/08/22 1033     Subjective  I did not do anything really since last Friday.  My mom is in the hospital -and I do know she cannot make it.  I barely slept in my bed.  I does feel like my nerve is now past my wrist.    Pertinent History 11/28/21 seen by Ortho PA- Jose Persia. is a 52 y.o. who presents today that is post right ulnar nerve transposition performed on 11/17/2021. Patient has discontinued wearing the sling and bracing that with applied at the time of surgery. He states he has had surgery on his neck and the sling was aggravating his neck. He has been working on range of motion of the elbow. His pleased with the results. Not a lot of change  in regards to numbness weakness of the upper extremity. He is complaining of a lot of pain to the arm.  Refer to PT but because of athrophy in R UE and neuropathy refer to OT - pt had C3-5 ANTERIOR CERVICAL DECOMPRESSION/DISCECTOMY FUSION 01/13/2021 Dr. Meade Maw at [facility],- as well as earlier this year Knee surgery- in past 5 cervical surgeries    Patient Stated Goals I want to be able to use my R arm and hand again to do my job as Horticulturist, commercial, take care of myself    Currently in Pain? Yes    Pain Score 4     Pain Location --   whole body   Pain Descriptors / Indicators Aching               Grip and prehension same - pt with some increase tightness in flexors because of tired from being at mom's bedside since last Friday and pain -cold weather             OT Treatments/Exercises (OP) - 03/08/22 0001        Moist Heat Therapy   Number Minutes Moist Heat 8 Minutes    Moist Heat Location --   on and off during session wrist and hand           Limited mostly in extensor carpi ulnaris Digit extension at fourth and fifth with fifth worse but improving. Patient able to maintain wrist neutral during grip testing. But still dropping into radial deviation against gravity. Able to maintain wrist in neutral with gravity working on fisting and extension of fingers.  Can do 10 reps with fifth digit MCP and PIP extension 20 to -25 degrees Thumb palmar radial abduction doing great doing strengthening Thumb radial abduction still tight but able to do some strengthening. Fine motor and opposition still limited but improving   Per pt numbness improving now in the DIP of the third PIP and distal on the fourth and fifth from distal palmar crease. Positive Tinel  now distal to ulnar wrist this date reported        Cont same HEP - decrease to 1 x wl Reinforced for patient to do passive range of motion for ulnar deviation prior to using 5 pound weight forfor ulnar deviation to neutral over armrest as well as supination to neutral -can do 20 reps pain-free. Add isometric for wrist UD -  Patient needed correction to do wrist extension horizontal wrist extention using weight  but need cueing to stay in neutral  position - 20 reps- need reminder again today   WB inbetween      Encourage patient to do weightbearing in between or moist heat on forearm and hand.   Hand exercises for thumb palmar radial abduction added rubber band 2 sets of 10.  Showed decrease strength in endrange  Pick up 2 cm block alternate digits - and then slide thumb down 2nd and 3rd digit   tendon glides for fingers staying the same.  Placed on hold from lumbrical to full extension. Add light blue putty for gripping and lat grip with rolling digits extention inbetween 15 reps  3 point pinch done 3 x 5 reps - WB inbetween if showing  increase flexion or spasm on 2nd PIP      .  Reinforced quality more than quantity of motion Wrist splint on with tasks            OT Education - 03/08/22 1112  Education Details progress and changes to HEP, nerve healing    Person(s) Educated Patient    Methods Explanation;Demonstration;Tactile cues;Verbal cues;Handout    Comprehension Verbal cues required;Returned demonstration;Verbalized understanding              OT Short Term Goals - 02/22/22 1810       OT SHORT TERM GOAL #1   Title Pt to be ind in use of R wrist splint and WB HEP to increase UD and wrist ext with digits extention    Baseline Kept wrist in 30 degrees of RD, pain and tightness , wrist drops into flexion with attmpts of digits extention - tightness mostly in flexors of hand and forearm NOW pt able to maintain wrist neutral with grip ,and digits extention to -30 to -35 at 5th MC and PIP. Wrist still drop into RD 30 degrees- wearing splint mostly when doing tasks around the house    Time 4    Period Weeks    Status On-going    Target Date 03/22/22      OT SHORT TERM GOAL #2   Title R thumb AROM increase in all planes for pt to hold cup, pick up 1 cm objects and use utencils    Baseline R thumb flexion decrease - tightness in extensors- decrease PA  45 and RA  38- not able to use hand functional in ADL's NOW pt show increase PA to WNL, RA still tight but increase after WB - able to pick up objects and release - smaller objects stil hard    Time 4    Period Weeks    Status On-going    Target Date 03/22/22      OT SHORT TERM GOAL #3   Title R wrist  motion increase in all ranges including sup with pain less than 2/10 to turn doorknob, stabilize wrist neutral during gripping and lifting of objects    Baseline supination pain 4/10 ; wrist flex during attempts of gripping and during lifting or extension of digits - wrist drop into flexion  NOW supination no pain except if over doing HEP , can do 5 lbs ,  can maintain wrist neutral with assessment of grip; Wrist dropping into RD against gravity - but can do 6-8 reps digits extention with wrist neutral wiht gravity    Time 4    Period Weeks    Status On-going    Target Date 03/22/22      OT SHORT TERM GOAL #4   Title R shoulder and elbow AROM increase to [employer] for pt use UE in ADL's with symptoms less than 3/10    Baseline Shoulder and elbow AROM WNL and strengty 4+/5 - still increase symptoms at times when overdoing act at home    Status Achieved                      Plan - 03/08/22 1113     Clinical Impression Statement Pt present 3 1/2 months s/p R ulnar N transposition 11/17/21- but pt with hx of 5 cervical surgeries with last one in Nov 22 after having seizures for few days in Nov 22 prior. Pt with significant atrophy and tightness in R dominant arm.  Since start of care patient showed great progress in right shoulder flexion and abduction as well as elbow flexion and extension within normal limits with increased 5+/5 strength.  Tinel this date proximal to wrist reported. Pt cont to be very motivated in sessions  to improve his ROM , strength and use of R dominant UE to return to work.  Pt this past 2 wks able to maintain wrist neutral with gravity. As well as increase  now to 10 reps making fist and opening digits extention - 5th - 20 to 25 extention at [facility] and PIP.  Supination WNL and increase strength using 5 lbs with less pain but still show RD of wrist.   Able to  cont 5 lbs for wrist with PROM for UD and weight bearing for forearm flexors stretch. Still using gravity for UD but add isometric for UD.   Pain cont to increase from 4-6/10 mostly now distally at wrist and hand -  but able to decrease with rest, heat and WB in session- pt need reminder not to over do exercises.  This date patient arrived with being at his mom's bedside since Friday.  Not sleeping or able to do home exercises.  Patient with same home program.  Pt remains  limited in use of R dominant UE mostly from forearm to hand in ADL's and IADL's and continues to benefit from skiled OT services to increase ROM, strength and function. Pt education done this date about nerve healing and anatomy.    OT Occupational Profile and History Problem Focused Assessment - Including review of records relating to presenting problem    Occupational performance deficits (Please refer to evaluation for details): ADL's;IADL's;Rest and Sleep;Play;Work;Leisure;Social Participation    Body Structure / Function / Physical Skills ADL;Coordination;Dexterity;Decreased knowledge of use of DME;Flexibility;ROM;UE functional use;Scar mobility;Muscle spasms;Sensation;Pain;Strength;IADL    Rehab Potential Fair    Clinical Decision Making Limited treatment options, no task modification necessary    Comorbidities Affecting Occupational Performance: May have comorbidities impacting occupational performance    Modification or Assistance to Complete Evaluation  No modification of tasks or assist necessary to complete eval    OT Frequency 1x / week    OT Duration 8 weeks    OT Treatment/Interventions Self-care/ADL training;Moist Heat;Therapeutic exercise;Patient/family education;Splinting;Scar mobilization;DME and/or AE instruction;Manual Therapy;Passive range of motion    Consulted and Agree with Plan of Care Patient             Patient will benefit from skilled therapeutic intervention in order to improve the following deficits and impairments:   Body Structure / Function / Physical Skills: ADL, Coordination, Dexterity, Decreased knowledge of use of DME, Flexibility, ROM, UE functional use, Scar mobility, Muscle spasms, Sensation, Pain, Strength, IADL       Visit Diagnosis: Stiffness of right hand, not elsewhere classified  Stiffness of right wrist, not elsewhere classified  Muscle weakness (generalized)  Ulnar neuropathy at elbow of right upper extremity    Problem  List Patient Active Problem List   Diagnosis Date Noted   S/P TKR (total knee replacement) using cement, left 04/12/2021   Cervical myelopathy ([place]) 01/13/2021   Seizure ([place]) 11/26/2020   Abnormal thyroid blood test 04/06/2016   Encounter for screening for HIV 01/05/2016   Abnormal weight gain 01/05/2016   Chronic prescription benzodiazepine use 10/19/2015   Elevated liver enzymes 08/20/2015   Medication monitoring encounter 08/20/2015   Hypokalemia 08/20/2015   Erectile dysfunction 04/22/2015   Status post hardware removal 04/20/2015   Bipolar 2 disorder ([place]) 07/29/2014   Marijuana abuse 07/29/2014   Chronic knee pain 07/29/2014   Bipolar 1 disorder, mixed ([place])    Drug abuse, opioid type ([place]) 06/28/2013   GERD without esophagitis 10/03/2012   Hypertension goal BP (blood pressure) <  140/90 10/03/2012   Chronic pain associated with significant psychosocial dysfunction 07/17/2012   Lumbar canal stenosis 04/09/2012   Hypercholesterolemia 01/06/2012   CN (constipation) 11/11/2011   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/2010   Decreased motor strength 09/30/2010   Current tobacco use 09/30/2010   H/O transient cerebral ischemia 09/09/2010   LBP (low back pain) 08/30/2010   Cervical spinal cord compression (HCC) 07/22/2010   Cervical post-laminectomy syndrome 07/22/2010   Chronic obstructive pulmonary disease (HCC) 02/23/2010    Oletta Cohn, OTR/L,CLT 03/08/2022, 11:16 AM  [facility] [address], [place], [address] Phone: [phone]   Fax:  [phone]  Name: Scott Howell MRN: [id number] Date of Birth: 1970/08/17

## 2022-03-15 ENCOUNTER — Ambulatory Visit: Payer: Medicaid Other | Admitting: Occupational Therapy

## 2022-03-22 ENCOUNTER — Ambulatory Visit: Payer: Medicaid Other | Attending: Surgery | Admitting: Occupational Therapy

## 2022-03-22 DIAGNOSIS — G5621 Lesion of ulnar nerve, right upper limb: Secondary | ICD-10-CM | POA: Diagnosis present

## 2022-03-22 DIAGNOSIS — M25641 Stiffness of right hand, not elsewhere classified: Secondary | ICD-10-CM | POA: Diagnosis present

## 2022-03-22 DIAGNOSIS — M6281 Muscle weakness (generalized): Secondary | ICD-10-CM | POA: Diagnosis present

## 2022-03-22 DIAGNOSIS — M25631 Stiffness of right wrist, not elsewhere classified: Secondary | ICD-10-CM | POA: Diagnosis present

## 2022-03-22 NOTE — Therapy (Signed)
[facility] [address] [place], [place], [address] Phone: [phone]   Fax:  [phone]  Occupational Therapy Treatment  Patient Details  Name: Scott Howell MRN: [id number] Date of Birth: 11-Oct-1970 Referring Provider (OT): Dr Joice Lofts   Encounter Date: 03/22/2022   OT End of Session - 03/22/22 1703     Visit Number 14    Number of Visits 16    Date for OT Re-Evaluation 03/22/22    OT Start Time 0945    OT Stop Time 1027    OT Time Calculation (min) 42 min    Activity Tolerance Patient tolerated treatment well    Behavior During Therapy [facility] for tasks assessed/performed             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   Schizophrenia (HCC)    Seizures (HCC) 11/2020   Stroke (HCC)    no deficits   Tobacco use     Past Surgical History:  Procedure Laterality Date   ANTERIOR CERVICAL DECOMP/DISCECTOMY FUSION N/A 01/13/2021   Procedure: C3-5 ANTERIOR CERVICAL DECOMPRESSION/DISCECTOMY FUSION;  Surgeon: Venetia Night, MD;  Location: [facility] ORS;  Service: Neurosurgery;  Laterality: N/A;   CERVICAL SPINE SURGERY     4   CLAVICLE SURGERY     HARDWARE REMOVAL Left 04/20/2015   Procedure: HARDWARE REMOVAL left leg;  Surgeon: Deeann Saint, MD;  Location: [facility];  Service: Orthopedics;  Laterality: Left;   KNEE SURGERY Left    NECK SURGERY     x6   SHOULDER SURGERY Left    TOTAL KNEE ARTHROPLASTY Left  04/12/2021   Procedure: TOTAL KNEE ARTHROPLASTY;  Surgeon: Lyndle Herrlich, MD;  Location: [facility] OR[facility];  Service: Orthopedics;  Laterality: Left;   ULNAR NERVE TRANSPOSITION Right 11/17/2021   Procedure: SUBCUTANEOUS TRANSPOSITION OF ULNAR NERVE, RIGHT ELBOW;  Surgeon: Christena Flake, MD;  Location: [facility];  Service: Orthopedics;  Laterality: Right;    There were no vitals filed for this visit.   Subjective Assessment - 03/22/22 1702     Subjective  I am sorry I missed last week's appointment I had to go and fix a car for my family up in [place].  I am doing the exercises on using my hand.  I have a little pain in my elbow mostly I feel my wrist is just tight and stiff and spasming sometimes.  Fingers coming along    Pertinent History 11/28/21 seen by Ortho PA- Jose Persia. is a 52 y.o. who presents today that is post right ulnar nerve transposition performed on 11/17/2021. Patient has discontinued wearing the sling and bracing that with applied at the time of surgery. He states he has had surgery on his neck and the sling was aggravating his neck. He has been working on  range of motion of the elbow. His pleased with the results. Not a lot of change in regards to numbness weakness of the upper extremity. He is complaining of a lot of pain to the arm.  Refer to PT but because of athrophy in R UE and neuropathy refer to OT - pt had C3-5 ANTERIOR CERVICAL DECOMPRESSION/DISCECTOMY FUSION 01/13/2021 Dr. Meade Maw at [facility],- as well as earlier this year Knee surgery- in past 5 cervical surgeries    Patient Stated Goals I want to be able to use my R arm and hand again to do my job as Horticulturist, commercial, take care of myself    Currently in Pain? No/denies                [facility] OT Assessment - 03/22/22 0001       Strength   Right Hand Grip (lbs) 30    Right Hand Lateral Pinch 6 lbs    Right Hand 3 Point Pinch 4 lbs    Left Hand Grip (lbs) 95    Left Hand Lateral Pinch 21 lbs    Left  Hand 3 Point Pinch 8 lbs                      OT Treatments/Exercises (OP) - 03/22/22 0001       Moist Heat Therapy   Number Minutes Moist Heat 8 Minutes    Moist Heat Location Hand;Wrist   prior to ROM           This date focused on weightbearing with carpal rolls by OT prior to changing home program for wrist to circular wrist motion using 2 pound then 3 pound and 5 pound weight. Tolerating very well 2 rounds of 15 reps. Patient reported wrist felt much better. Patient educated on doing this home program.  Tinel improved to distal to ulnar wrist compared to last time proximal to the wrist. Increased civilization at the wrist with digit extension as well as making a fist. Fifth digit still limited mostly in full extension .  Add also fine motor for patient to do 2-point pinch picking up 2 cm up check alternating digits. Could not do small objects. Was unable to do manipulation or opposition from objects from palm to digits. Most of pain is spasming at ulnar wrist if patient overdo exercises or task. Patient continues to improve.  Shoulder and elbow as well as forearm within normal limits.        OT Education - 03/22/22 1703     Education Details progress and changes to HEP, nerve healing    Person(s) Educated Patient    Methods Explanation;Demonstration;Tactile cues;Verbal cues;Handout    Comprehension Verbal cues required;Returned demonstration;Verbalized understanding              OT Short Term Goals - 02/22/22 1810       OT SHORT TERM GOAL #1   Title Pt to be ind in use of R wrist splint and WB HEP to increase UD and wrist ext with digits extention    Baseline Kept wrist in 30 degrees of RD, pain and tightness , wrist drops into flexion with attmpts of digits extention - tightness mostly in flexors of hand and forearm NOW pt able to maintain wrist neutral with grip ,and digits extention to -30 to -35 at 5th MC and PIP. Wrist still drop into RD 30  degrees- wearing splint mostly when doing tasks around the house    Time 4  Period Weeks    Status On-going    Target Date 03/22/22      OT SHORT TERM GOAL #2   Title R thumb AROM increase in all planes for pt to hold cup, pick up 1 cm objects and use utencils    Baseline R thumb flexion decrease - tightness in extensors- decrease PA  45 and RA  38- not able to use hand functional in ADL's NOW pt show increase PA to WNL, RA still tight but increase after WB - able to pick up objects and release - smaller objects stil hard    Time 4    Period Weeks    Status On-going    Target Date 03/22/22      OT SHORT TERM GOAL #3   Title R wrist  motion increase in all ranges including sup with pain less than 2/10 to turn doorknob, stabilize wrist neutral during gripping and lifting of objects    Baseline supination pain 4/10 ; wrist flex during attempts of gripping and during lifting or extension of digits - wrist drop into flexion  NOW supination no pain except if over doing HEP , can do 5 lbs , can maintain wrist neutral with assessment of grip; Wrist dropping into RD against gravity - but can do 6-8 reps digits extention with wrist neutral wiht gravity    Time 4    Period Weeks    Status On-going    Target Date 03/22/22      OT SHORT TERM GOAL #4   Title R shoulder and elbow AROM increase to [facility] for pt use UE in ADL's with symptoms less than 3/10    Baseline Shoulder and elbow AROM WNL and strengty 4+/5 - still increase symptoms at times when overdoing act at home    Status Achieved                      Plan - 03/22/22 1704     Clinical Impression Statement Pt present 4 months s/p R ulnar N transposition 11/17/21- but pt with hx of 5 cervical surgeries with last one in Nov 22 after having seizures for few days in Nov 22 prior. Pt with significant atrophy and tightness in R dominant arm.  Since start of care patient showed great progress in right shoulder AROM and strength in all  planes as well as elbow flexion and extension within normal limits with increased 5/5 strength.  Tinel this date distal to wrist compare to last time proximal to wrist. Pt cont to be very motivated in sessions to improve his ROM , strength and use of R dominant UE to return to work.  Pt able to maintain wrist neutral making grip and able to extend digits 2nd thru 4th with wrist in neutral. 5th digit extention improving. Pt cont to show decrease UD for wrist- this date done some WB with carpal rolls by OT prior  changing HEP for wrist to circular motion with 2 lbs , then 3 lbs and then 5 lbs - focusing on wrist ext, UD going into flexion and RD - report felt much better. Limited mostly in wrist with his work.  Pain cont to increase at ulnar wrist if over doing tasks or exercises.  Pt remains limited in use of R dominant UE mostly from wrist to hand in ADL's and IADL's and continues to benefit from skiled OT services to increase ROM, strength and function.    OT Occupational Profile and History  Problem Focused Assessment - Including review of records relating to presenting problem    Occupational performance deficits (Please refer to evaluation for details): ADL's;IADL's;Rest and Sleep;Play;Work;Leisure;Social Participation    Body Structure / Function / Physical Skills ADL;Coordination;Dexterity;Decreased knowledge of use of DME;Flexibility;ROM;UE functional use;Scar mobility;Muscle spasms;Sensation;Pain;Strength;IADL    Rehab Potential Fair    Clinical Decision Making Limited treatment options, no task modification necessary    Comorbidities Affecting Occupational Performance: May have comorbidities impacting occupational performance    Modification or Assistance to Complete Evaluation  No modification of tasks or assist necessary to complete eval    OT Frequency Biweekly    OT Duration 8 weeks    OT Treatment/Interventions Self-care/ADL training;Moist Heat;Therapeutic exercise;Patient/family  education;Splinting;Scar mobilization;DME and/or AE instruction;Manual Therapy;Passive range of motion    Consulted and Agree with Plan of Care Patient             Patient will benefit from skilled therapeutic intervention in order to improve the following deficits and impairments:   Body Structure / Function / Physical Skills: ADL, Coordination, Dexterity, Decreased knowledge of use of DME, Flexibility, ROM, UE functional use, Scar mobility, Muscle spasms, Sensation, Pain, Strength, IADL       Visit Diagnosis: Stiffness of right hand, not elsewhere classified  Stiffness of right wrist, not elsewhere classified  Muscle weakness (generalized)  Ulnar neuropathy at elbow of right upper extremity    Problem List Patient Active Problem List   Diagnosis Date Noted   S/P TKR (total knee replacement) using cement, left 04/12/2021   Cervical myelopathy ([place]) 01/13/2021   Seizure ([place]) 11/26/2020   Abnormal thyroid blood test 04/06/2016   Encounter for screening for HIV 01/05/2016   Abnormal weight gain 01/05/2016   Chronic prescription benzodiazepine use 10/19/2015   Elevated liver enzymes 08/20/2015   Medication monitoring encounter 08/20/2015   Hypokalemia 08/20/2015   Erectile dysfunction 04/22/2015   Status post hardware removal 04/20/2015   Bipolar 2 disorder ([place]) 07/29/2014   Marijuana abuse 07/29/2014   Chronic knee pain 07/29/2014   Bipolar 1 disorder, mixed (HCC)    Drug abuse, opioid type ([place]) 06/28/2013   GERD without esophagitis 10/03/2012   Hypertension goal BP (blood pressure) < 140/90 10/03/2012   Chronic pain associated with significant psychosocial dysfunction 07/17/2012   Lumbar canal stenosis 04/09/2012   Hypercholesterolemia 01/06/2012   CN (constipation) 11/11/2011   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/2010   Decreased motor strength 09/30/2010   Current tobacco use 09/30/2010   H/O transient cerebral ischemia 09/09/2010   LBP (low back pain)  08/30/2010   Cervical spinal cord compression (HCC) 07/22/2010   Cervical post-laminectomy syndrome 07/22/2010   Chronic obstructive pulmonary disease ([place]) 02/23/2010    Rosalyn Gess, OTR/L,CLT 03/22/2022, 5:10 PM  [facility] [address], [place], [address] Phone: [phone]   Fax:  [phone]  Name: Scott Howell MRN: [id number] Date of Birth: Dec 12, 1970

## 2022-03-29 ENCOUNTER — Ambulatory Visit: Payer: Medicaid Other | Admitting: Occupational Therapy

## 2022-04-05 ENCOUNTER — Ambulatory Visit: Payer: Medicaid Other | Admitting: Occupational Therapy

## 2022-04-12 ENCOUNTER — Ambulatory Visit: Payer: Medicaid Other | Admitting: Occupational Therapy

## 2022-04-19 ENCOUNTER — Ambulatory Visit: Payer: Medicaid Other | Attending: Surgery | Admitting: Occupational Therapy

## 2022-04-19 DIAGNOSIS — M25641 Stiffness of right hand, not elsewhere classified: Secondary | ICD-10-CM | POA: Insufficient documentation

## 2022-04-19 DIAGNOSIS — M25631 Stiffness of right wrist, not elsewhere classified: Secondary | ICD-10-CM | POA: Insufficient documentation

## 2022-04-19 DIAGNOSIS — M6281 Muscle weakness (generalized): Secondary | ICD-10-CM | POA: Insufficient documentation

## 2022-04-19 DIAGNOSIS — G5621 Lesion of ulnar nerve, right upper limb: Secondary | ICD-10-CM | POA: Insufficient documentation

## 2022-04-28 ENCOUNTER — Ambulatory Visit: Payer: Medicaid Other | Admitting: Occupational Therapy

## 2022-04-28 ENCOUNTER — Encounter: Payer: Self-pay | Admitting: Occupational Therapy

## 2022-04-28 DIAGNOSIS — M25631 Stiffness of right wrist, not elsewhere classified: Secondary | ICD-10-CM

## 2022-04-28 DIAGNOSIS — G5621 Lesion of ulnar nerve, right upper limb: Secondary | ICD-10-CM

## 2022-04-28 DIAGNOSIS — M6281 Muscle weakness (generalized): Secondary | ICD-10-CM | POA: Diagnosis present

## 2022-04-28 DIAGNOSIS — M25641 Stiffness of right hand, not elsewhere classified: Secondary | ICD-10-CM | POA: Diagnosis present

## 2022-04-28 NOTE — Therapy (Signed)
[facility] [address], [place], [address] Phone: [phone]   Fax:  [phone]  Occupational Therapy Treatment/Reeval  Patient Details  Name: Scott Howell MRN: [id number] Date of Birth: Nov 30, 1970 Referring Provider (OT): Dr Roland Rack   Encounter Date: 04/28/2022   OT End of Session - 04/28/22 1257     Visit Number 15    Number of Visits 20    Date for OT Re-Evaluation 07/21/22    OT Start Time 0820    OT Stop Time 0905    OT Time Calculation (min) 45 min    Activity Tolerance Patient tolerated treatment well    Behavior During Therapy [facility] for tasks assessed/performed             Past Medical History:  Diagnosis Date   Arthritis    Asthma    Bipolar 1 disorder ([place])    COPD (chronic obstructive pulmonary disease) ([place])    Drug abuse, opioid type ([place])    Dyspnea    Fibromyalgia    GERD (gastroesophageal reflux disease)    H/O ETOH abuse    Headache    High cholesterol    History of 2019 novel coronavirus disease (COVID-19) 11/01/2021   Homicidal ideation    Hypertension    Living accommodation issues    a.) as of 03/29/2021 --> living in camper   Marijuana use    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   Schizophrenia ([place])    Seizures ([place]) 11/2020   Stroke ([place])    no deficits   Tobacco use     Past Surgical History:  Procedure Laterality Date   ANTERIOR CERVICAL DECOMP/DISCECTOMY FUSION N/A 01/13/2021   Procedure: C3-5 ANTERIOR CERVICAL DECOMPRESSION/DISCECTOMY FUSION;  Surgeon: Meade Maw, MD;  Location: [facility] ORS;  Service: Neurosurgery;  Laterality: N/A;   CERVICAL SPINE SURGERY     4   CLAVICLE SURGERY     HARDWARE REMOVAL Left 04/20/2015   Procedure: HARDWARE REMOVAL left leg;  Surgeon: Earnestine Leys, MD;  Location: [facility] ORS;  Service: Orthopedics;  Laterality: Left;   KNEE SURGERY Left    NECK SURGERY     x6   SHOULDER SURGERY Left    TOTAL KNEE ARTHROPLASTY  Left 04/12/2021   Procedure: TOTAL KNEE ARTHROPLASTY;  Surgeon: Lovell Sheehan, MD;  Location: [facility] ORS;  Service: Orthopedics;  Laterality: Left;   ULNAR NERVE TRANSPOSITION Right 11/17/2021   Procedure: SUBCUTANEOUS TRANSPOSITION OF ULNAR NERVE, RIGHT ELBOW;  Surgeon: Corky Mull, MD;  Location: [facility] ORS;  Service: Orthopedics;  Laterality: Right;    There were no vitals filed for this visit.   Subjective Assessment - 04/28/22 1253     Subjective  My mom past away -and had to move a lot of things- so I missed appt - sorry - but my wrist still bends to the one side and cannot get it to bend to my pinkie side - and it hurts as I use my hand    Pertinent History 11/28/21 seen by Ortho PA- Karren Cobble. is a 52 y.o. who presents today that is post right ulnar nerve transposition performed on 11/17/2021. Patient has discontinued wearing the sling and bracing that with applied at the time of surgery. He states he has had surgery on his neck and the sling was aggravating his neck. He has been working on range of motion of the elbow. His pleased with the results. Not  a lot of change in regards to numbness weakness of the upper extremity. He is complaining of a lot of pain to the arm.  Refer to PT but because of athrophy in R UE and neuropathy refer to OT - pt had C3-5 ANTERIOR CERVICAL DECOMPRESSION/DISCECTOMY FUSION 01/13/2021 Dr. Meade Maw at [facility],- as well as earlier this year Knee surgery- in past 5 cervical surgeries    Patient Stated Goals I want to be able to use my R arm and hand again to do my job as Horticulturist, commercial, take care of myself    Currently in Pain? No/denies                [facility] OT Assessment - 04/28/22 0001       AROM   Right Wrist Ulnar Deviation --   neutral with gravity , on table sliding 100% t/c     Strength   Right Hand Grip (lbs) 30    Right Hand Lateral Pinch 6 lbs    Right Hand 3 Point Pinch 6 lbs    Left Hand Grip (lbs) 95    Left Hand Lateral Pinch  21 lbs    Left Hand 3 Point Pinch 8 lbs              Pt arrive with 5/5 strength in shoulder and elbow -  Tinel improve to [facility] on ulnar wrist this date- was month ago at proximal palm Numbness in 5th to [facility] only and ulnar side of DIP of 4th  R wrist cont to rest in RD - After PROM on table sliding on paper - and 100% t/c  Could facilitate UD to neutral 5 reps - and need PROM inbetween Over edge of table - UD with gravity to neutral -and isometric - but to able to apply pressure into hand to push away but able to resist with gravity   Pt to do these 2 HEP at home  No weight for wrist Pain end of day and after grip assessment - increase to 8/10 pain cramping Pain decrease after paraffin  Digit and [facility] still decrease - PA and RA of thumb improving - Able to do opposition to 2nd and 3rd but not 4th and 5th 3 point pinch or picking up object of 2cm by 2 cm - hard but able to do  Add to HEP  Froments sign improving - but Lat pinch same  Add green putty for lat pinch to HEP  And can do light pinch 2 point - 2nd and 3rd into putty But no over do  Use Benik neoprene during [facility] and pinching to support wrist - decrease pain  Cont to progress         OT Treatments/Exercises (OP) - 04/28/22 0001       RUE Paraffin   Number Minutes Paraffin 8 Minutes    RUE Paraffin Location Hand   wrist   Comments end of ROM to decrease pain                    OT Education - 04/28/22 1257     Education Details progress and changes to HEP, nerve healing    Person(s) Educated Patient    Methods Explanation;Demonstration;Tactile cues;Verbal cues;Handout    Comprehension Verbal cues required;Returned demonstration;Verbalized understanding              OT Short Term Goals - 04/28/22 1345       OT SHORT TERM GOAL #1  Title Pt to be ind in use of R wrist splint and WB HEP to increase UD and wrist ext with digits extention    Baseline Kept wrist in 30 degrees of RD, pain and  tightness , wrist drops into flexion with attmpts of digits extention - tightness mostly in flexors of hand and forearm NOW pt able to maintain wrist neutral with grip ,and digits extention to -30 to -35 at 5th MC and PIP. Wrist still drop into RD 30 degrees- wearing splint mostly when doing tasks around the house- 3/02/17/22 can do wrist to neutral on table and with gravity after PROM and 100% t/c    Time 6    Period Weeks    Status On-going    Target Date 06/09/22      OT SHORT TERM GOAL #2   Title R thumb AROM increase in all planes for pt to hold cup, pick up 1 cm objects and use utencils    Baseline R thumb flexion decrease - tightness in extensors- decrease PA  45 and RA  38- not able to use hand functional in ADL's NOW pt show increase PA to WNL, RA to [facility] - able to pick up objects and release - smaller objects stil hard and holding utencil    Time 4    Period Weeks    Status On-going    Target Date 05/26/22      OT SHORT TERM GOAL #3   Title R wrist  motion increase in all ranges including sup with pain less than 2/10 to turn doorknob, stabilize wrist neutral during gripping and lifting of objects    Status Achieved      OT SHORT TERM GOAL #4   Title R shoulder and elbow AROM increase to [facility] for pt use UE in ADL's with symptoms less than 3/10    Status Achieved               OT Long Term Goals - 04/28/22 1348       OT LONG TERM GOAL #1   Title R wrist UD increase to WNL on table sliding to initiated isometric  and weight to decrease pain during day less than 3/10    Baseline Wrist resting in RD- this date on table sliding after PROM and 100% t/c can slide UD to neutral -and over edge of table to neutral over table - pain increase with use during day and grip - 8/10    Time 12    Period Weeks    Status New    Target Date 07/21/22                   Plan - 04/28/22 1259     Clinical Impression Statement Pt present 4 months s/p R ulnar N transposition 11/17/21-  but pt with hx of 5 cervical surgeries with last one in Nov 22 after having seizures for few days in Nov 22 prior. Pt with significant atrophy and tightness in R dominant arm.  Since start of care patient showed great progress in right shoulder AROM and strength in all planes as well as elbow flexion and extension within normal limits with increased 5/5 strength. Pt was seen month ago for last appt- pt mom past away. Returning today with improvement in [facility] at [facility] on ulnar side of hand. Pt cont to be very motivated in sessions to improve his ROM , strength and use of R dominant UE to return to work.  Pt  able to maintain wrist neutral making grip and able to extend digits 2nd thru 4th with wrist in neutral. 5th digit extention improving. Pt the most limited and frustrated with wrist resting and getting tight into RD. Causing increase pain with use and grip. Pt this date able after PROM to facilitate wrist to neutral with gravity and sliding on paper with forearm and elbow supported - needed 100% t/c.  Pt to hold of on any weight for wrist and elbow. Focus on UD for wrist AAROM on table sliding and with gravity and isometric strengthening.  Limited mostly in wrist with his work.  Pt also limited in [facility], opposition to 2nd and 3rd but hard time doing 3 point - Froments improving slowly. Intrinsic athropy in hand.   Pt remains limited in use of R dominant  hand and wrist  in ADL's and IADL's and continues to benefit from skiled OT services to increase ROM, strength and function.    OT Occupational Profile and History Problem Focused Assessment - Including review of records relating to presenting problem    Occupational performance deficits (Please refer to evaluation for details): ADL's;IADL's;Rest and Sleep;Play;Work;Leisure;Social Participation    Body Structure / Function / Physical Skills ADL;Coordination;Dexterity;Decreased knowledge of use of DME;Flexibility;ROM;UE functional use;Scar mobility;Muscle  spasms;Sensation;Pain;Strength;IADL    Rehab Potential Fair    Clinical Decision Making Limited treatment options, no task modification necessary    Comorbidities Affecting Occupational Performance: May have comorbidities impacting occupational performance    Modification or Assistance to Complete Evaluation  No modification of tasks or assist necessary to complete eval    OT Frequency Biweekly    OT Duration 12 weeks    OT Treatment/Interventions Self-care/ADL training;Moist Heat;Therapeutic exercise;Patient/family education;Splinting;Scar mobilization;DME and/or AE instruction;Manual Therapy;Passive range of motion    Consulted and Agree with Plan of Care Patient             Patient will benefit from skilled therapeutic intervention in order to improve the following deficits and impairments:   Body Structure / Function / Physical Skills: ADL, Coordination, Dexterity, Decreased knowledge of use of DME, Flexibility, ROM, UE functional use, Scar mobility, Muscle spasms, Sensation, Pain, Strength, IADL       Visit Diagnosis: Stiffness of right hand, not elsewhere classified  Stiffness of right wrist, not elsewhere classified  Muscle weakness (generalized)  Ulnar neuropathy at elbow of right upper extremity    Problem List Patient Active Problem List   Diagnosis Date Noted   S/P TKR (total knee replacement) using cement, left 04/12/2021   Cervical myelopathy ([place]) 01/13/2021   Seizure ([place]) 11/26/2020   Abnormal thyroid blood test 04/06/2016   Encounter for screening for HIV 01/05/2016   Abnormal weight gain 01/05/2016   Chronic prescription benzodiazepine use 10/19/2015   Elevated liver enzymes 08/20/2015   Medication monitoring encounter 08/20/2015   Hypokalemia 08/20/2015   Erectile dysfunction 04/22/2015   Status post hardware removal 04/20/2015   Bipolar 2 disorder ([place]) 07/29/2014   Marijuana abuse 07/29/2014   Chronic knee pain 07/29/2014   Bipolar 1 disorder,  mixed ([place])    Drug abuse, opioid type ([place]) 06/28/2013   GERD without esophagitis 10/03/2012   Hypertension goal BP (blood pressure) < 140/90 10/03/2012   Chronic pain associated with significant psychosocial dysfunction 07/17/2012   Lumbar canal stenosis 04/09/2012   Hypercholesterolemia 01/06/2012   CN (constipation) 11/11/2011   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/2010   Decreased motor strength 09/30/2010   Current tobacco use 09/30/2010   H/O transient  cerebral ischemia 09/09/2010   LBP (low back pain) 08/30/2010   Cervical spinal cord compression ([place]) 07/22/2010   Cervical post-laminectomy syndrome 07/22/2010   Chronic obstructive pulmonary disease ([place]) 02/23/2010    Rosalyn Gess, OTR/L,CLT 04/28/2022, 1:53 PM  [facility] [address] [place], [address] Phone: [phone]   Fax:  [phone]  Name: Scott Howell MRN: [id number] Date of Birth: November 16, 1970

## 2022-05-17 ENCOUNTER — Ambulatory Visit: Payer: Medicaid Other | Attending: Surgery | Admitting: Occupational Therapy

## 2022-05-17 DIAGNOSIS — M6281 Muscle weakness (generalized): Secondary | ICD-10-CM | POA: Diagnosis present

## 2022-05-17 DIAGNOSIS — G5621 Lesion of ulnar nerve, right upper limb: Secondary | ICD-10-CM | POA: Insufficient documentation

## 2022-05-17 DIAGNOSIS — M25641 Stiffness of right hand, not elsewhere classified: Secondary | ICD-10-CM | POA: Insufficient documentation

## 2022-05-17 DIAGNOSIS — M25631 Stiffness of right wrist, not elsewhere classified: Secondary | ICD-10-CM | POA: Insufficient documentation

## 2022-05-17 NOTE — Therapy (Signed)
[facility] [address], [place], [address] Phone: [phone]   Fax:  [phone]  Occupational Therapy Treatment  Patient Details  Name: Scott Howell MRN: [id number] Date of Birth: 1970-06-24 Referring Provider (OT): Dr Roland Rack   Encounter Date: 05/17/2022   OT End of Session - 05/17/22 0856     Visit Number 16    Number of Visits 20    Date for OT Re-Evaluation 07/21/22    OT Start Time 0822    OT Stop Time 0855    OT Time Calculation (min) 33 min    Activity Tolerance Patient tolerated treatment well    Behavior During Therapy [facility] for tasks assessed/performed             Past Medical History:  Diagnosis Date   Arthritis    Asthma    Bipolar 1 disorder ([place])    COPD (chronic obstructive pulmonary disease) ([place])    Drug abuse, opioid type ([place])    Dyspnea    Fibromyalgia    GERD (gastroesophageal reflux disease)    H/O ETOH abuse    Headache    High cholesterol    History of 2019 novel coronavirus disease (COVID-19) 11/01/2021   Homicidal ideation    Hypertension    Living accommodation issues    a.) as of 03/29/2021 --> living in camper   Marijuana use    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   Schizophrenia ([place])    Seizures ([place]) 11/2020   Stroke ([place])    no deficits   Tobacco use     Past Surgical History:  Procedure Laterality Date   ANTERIOR CERVICAL DECOMP/DISCECTOMY FUSION N/A 01/13/2021   Procedure: C3-5 ANTERIOR CERVICAL DECOMPRESSION/DISCECTOMY FUSION;  Surgeon: Meade Maw, MD;  Location: [facility];  Service: Neurosurgery;  Laterality: N/A;   CERVICAL SPINE SURGERY     4   CLAVICLE SURGERY     HARDWARE REMOVAL Left 04/20/2015   Procedure: HARDWARE REMOVAL left leg;  Surgeon: Earnestine Leys, MD;  Location: [facility];  Service: Orthopedics;  Laterality: Left;   KNEE SURGERY Left    NECK SURGERY     x6   SHOULDER SURGERY Left    TOTAL KNEE ARTHROPLASTY Left  04/12/2021   Procedure: TOTAL KNEE ARTHROPLASTY;  Surgeon: Lovell Sheehan, MD;  Location: [facility];  Service: Orthopedics;  Laterality: Left;   ULNAR NERVE TRANSPOSITION Right 11/17/2021   Procedure: SUBCUTANEOUS TRANSPOSITION OF ULNAR NERVE, RIGHT ELBOW;  Surgeon: Corky Mull, MD;  Location: [facility];  Service: Orthopedics;  Laterality: Right;    There were no vitals filed for this visit.   Subjective Assessment - 05/17/22 0854     Subjective  I tried the do that exercise -I can do the passive one but the sliding of the wrist -it hurts all the way up to my elbow on pinkie side and then it hurts afterwards    Pertinent History 11/28/21 seen by Ortho PA- Karren Cobble. is a 52 y.o. who presents today that is post right ulnar nerve transposition performed on 11/17/2021. Patient has discontinued wearing the sling and bracing that with applied at the time of surgery. He states he has had surgery on his neck and the sling was aggravating his neck. He has been working on range of motion of the elbow. His pleased with the results. Not a lot of change in regards to numbness weakness of the upper  extremity. He is complaining of a lot of pain to the arm.  Refer to PT but because of athrophy in R UE and neuropathy refer to OT - pt had C3-5 ANTERIOR CERVICAL DECOMPRESSION/DISCECTOMY FUSION 01/13/2021 Dr. Meade Maw at [facility],- as well as earlier this year Knee surgery- in past 5 cervical surgeries    Patient Stated Goals I want to be able to use my R arm and hand again to do my job as Horticulturist, commercial, take care of myself    Currently in Pain? Yes    Pain Location Wrist    Pain Orientation Right    Pain Descriptors / Indicators Aching;Sharp    Pain Type Acute pain;Neuropathic pain            Grip and prehension strength same as last visit  Pt arrive with 5/5 strength in shoulder and elbow -  [place]  cont to be at  [facility] on ulnar wrist this date- was 4-6 wks ago at proximal palm Numbness in 5th to  [facility] only and ulnar side of DIP of 4th  R wrist cont to rest in RD - can tolerate PROM in UD Could facilitate UD to neutral  but when attempting Isometric and YTB - pt has pain on ulnar wrist to ulnar elbow   Over edge of table - UD with gravity to neutral -and isometric - but to able to apply pressure into hand to push away but able to resist with gravity    Pt tender over wrist ulnar styloid - more prominent Pain end of day and after grip assessment - increase to 8/10 pain cramping Pain decrease after paraffin   Cont with same Digit and [place] still decrease - PA and RA of thumb improving - Able to do opposition to 2nd and 3rd but not 4th and 5th 3 point pinch or picking up object of 2cm by 2 cm - hard but able to do  Add to HEP  Froments sign improving - but Lat pinch same  Cont green putty for lat pinch to HEP  And can do light pinch 2 point - 2nd and 3rd into putty But no over do  Use Benik neoprene during [facility] and pinching to support wrist - decrease pain  Follow up with DR Poggi 12th April- ? Xray of wrist - pain on ulnar wrist                 OT Treatments/Exercises (OP) - 05/17/22 0001       RUE Paraffin   Number Minutes Paraffin 8 Minutes    RUE Paraffin Location Hand    Comments end of session decrease pain in ulnar wrist                    OT Education - 05/17/22 0856     Education Details progress and changes to HEP, nerve healing    Person(s) Educated Patient    Methods Explanation;Demonstration;Tactile cues;Verbal cues;Handout    Comprehension Verbal cues required;Returned demonstration;Verbalized understanding              OT Short Term Goals - 04/28/22 1345       OT SHORT TERM GOAL #1   Title Pt to be ind in use of R wrist splint and WB HEP to increase UD and wrist ext with digits extention    Baseline Kept wrist in 30 degrees of RD, pain and tightness , wrist drops into flexion with attmpts of digits extention - tightness  mostly in  flexors of hand and forearm NOW pt able to maintain wrist neutral with grip ,and digits extention to -30 to -35 at 5th [facility] and PIP. Wrist still drop into RD 30 degrees- wearing splint mostly when doing tasks around the house- 3/02/17/22 can do wrist to neutral on table and with gravity after PROM and 100% t/c    Time 6    Period Weeks    Status On-going    Target Date 06/09/22      OT SHORT TERM GOAL #2   Title R thumb AROM increase in all planes for pt to hold cup, pick up 1 cm objects and use utencils    Baseline R thumb flexion decrease - tightness in extensors- decrease PA  45 and RA  38- not able to use hand functional in ADL's NOW pt show increase PA to WNL, RA to [facility] - able to pick up objects and release - smaller objects stil hard and holding utencil    Time 4    Period Weeks    Status On-going    Target Date 05/26/22      OT SHORT TERM GOAL #3   Title R wrist  motion increase in all ranges including sup with pain less than 2/10 to turn doorknob, stabilize wrist neutral during gripping and lifting of objects    Status Achieved      OT SHORT TERM GOAL #4   Title R shoulder and elbow AROM increase to [facility] for pt use UE in ADL's with symptoms less than 3/10    Status Achieved               OT Long Term Goals - 04/28/22 1348       OT LONG TERM GOAL #1   Title R wrist UD increase to WNL on table sliding to initiated isometric  and weight to decrease pain during day less than 3/10    Baseline Wrist resting in RD- this date on table sliding after PROM and 100% t/c can slide UD to neutral -and over edge of table to neutral over table - pain increase with use during day and grip - 8/10    Time 12    Period Weeks    Status New    Target Date 07/21/22                   Plan - 05/17/22 0856     Clinical Impression Statement Pt present 6 months s/p R ulnar N transposition 11/17/21- but pt with hx of 5 cervical surgeries with last one in Nov 22 after having seizures for few  days in Nov 22 prior. Pt with significant atrophy and tightness in R dominant arm.  Since start of care patient showed great progress in right shoulder,elbow and forearm AROM and strength in all planes. with 5/5 strength. Pt was seen 2 x the last 6-8 wks -his mom past away.  Tinel improving to [facility] on ulnar side of hand. Pt cont to be very motivated in sessions to improve his ROM , strength and use of R dominant UE to return to work.  Pt able to maintain wrist neutral making grip and able to extend digits 2nd thru 4th with wrist in neutral. 5th digit extention improving. Pt cont to show and  most limited and frustrated with wrist resting  still into RD. Causing increase pain with use and grip. Pt can do pain free UD PROM  and facilitate wrist to neutral  with gravity - but increase pain with any AAROM and isometric strenghtening for UD. Pt limityed mostly in wrist with his work.  Pt also limited in [facility], opposition to 2nd and 3rd but hard time doing 3 point - Froments improving slowly. Intrinsic athropy in hand.  Pt to follow up with surgeon the 12th April - ? xray of wrist.  Pt remains limited in use of R dominant  hand and wrist  in ADL's and IADL's and continues to benefit from skiled OT services to increase ROM, strength and function.    OT Occupational Profile and History Problem Focused Assessment - Including review of records relating to presenting problem    Occupational performance deficits (Please refer to evaluation for details): ADL's;IADL's;Rest and Sleep;Play;Work;Leisure;Social Participation    Body Structure / Function / Physical Skills ADL;Coordination;Dexterity;Decreased knowledge of use of DME;Flexibility;ROM;UE functional use;Scar mobility;Muscle spasms;Sensation;Pain;Strength;IADL    Rehab Potential Fair    Clinical Decision Making Limited treatment options, no task modification necessary    Comorbidities Affecting Occupational Performance: May have comorbidities impacting occupational  performance    Modification or Assistance to Complete Evaluation  No modification of tasks or assist necessary to complete eval    OT Frequency Biweekly    OT Duration 12 weeks    OT Treatment/Interventions Self-care/ADL training;Moist Heat;Therapeutic exercise;Patient/family education;Splinting;Scar mobilization;DME and/or AE instruction;Manual Therapy;Passive range of motion    Consulted and Agree with Plan of Care Patient             Patient will benefit from skilled therapeutic intervention in order to improve the following deficits and impairments:   Body Structure / Function / Physical Skills: ADL, Coordination, Dexterity, Decreased knowledge of use of DME, Flexibility, ROM, UE functional use, Scar mobility, Muscle spasms, Sensation, Pain, Strength, IADL       Visit Diagnosis: Stiffness of right hand, not elsewhere classified  Stiffness of right wrist, not elsewhere classified  Muscle weakness (generalized)  Ulnar neuropathy at elbow of right upper extremity    Problem List Patient Active Problem List   Diagnosis Date Noted   S/P TKR (total knee replacement) using cement, left 04/12/2021   Cervical myelopathy 01/13/2021   Seizure 11/26/2020   Abnormal thyroid blood test 04/06/2016   Encounter for screening for HIV 01/05/2016   Abnormal weight gain 01/05/2016   Chronic prescription benzodiazepine use 10/19/2015   Elevated liver enzymes 08/20/2015   Medication monitoring encounter 08/20/2015   Hypokalemia 08/20/2015   Erectile dysfunction 04/22/2015   Status post hardware removal 04/20/2015   Bipolar 2 disorder 07/29/2014   Marijuana abuse 07/29/2014   Chronic knee pain 07/29/2014   Bipolar 1 disorder, mixed (HCC)    Drug abuse, opioid type 06/28/2013   GERD without esophagitis 10/03/2012   Hypertension goal BP (blood pressure) < 140/90 10/03/2012   Chronic pain associated with significant psychosocial dysfunction 07/17/2012   Lumbar canal stenosis 04/09/2012    Hypercholesterolemia 01/06/2012   CN (constipation) 11/11/2011   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/2010   Decreased motor strength 09/30/2010   Current tobacco use 09/30/2010   H/O transient cerebral ischemia 09/09/2010   LBP (low back pain) 08/30/2010   Cervical spinal cord compression 07/22/2010   Cervical post-laminectomy syndrome 07/22/2010   Chronic obstructive pulmonary disease 02/23/2010    Rosalyn Gess, OTR/L,CLT 05/17/2022, 9:02 AM  [facility] [address], [place], [address] Phone: [phone]   Fax:  [phone]  Name: LOVETT BENDA MRN: [id number] Date of Birth:  01/05/1971  

## 2022-05-31 ENCOUNTER — Ambulatory Visit: Payer: Medicaid Other | Admitting: Occupational Therapy

## 2022-05-31 DIAGNOSIS — M25641 Stiffness of right hand, not elsewhere classified: Secondary | ICD-10-CM

## 2022-05-31 DIAGNOSIS — M25631 Stiffness of right wrist, not elsewhere classified: Secondary | ICD-10-CM

## 2022-05-31 DIAGNOSIS — M6281 Muscle weakness (generalized): Secondary | ICD-10-CM

## 2022-05-31 DIAGNOSIS — G5621 Lesion of ulnar nerve, right upper limb: Secondary | ICD-10-CM

## 2022-05-31 NOTE — Therapy (Signed)
[facility] [address] [place], [place], [address] Phone: [phone]   Fax:  [phone]  Occupational Therapy Treatment  Patient Details  Name: Scott Howell MRN: [id number] Date of Birth: 08-02-1970 Referring Provider (OT): Dr Joice Lofts   Encounter Date: 05/31/2022   OT End of Session - 05/31/22 0911     Visit Number 17    Number of Visits 20    Date for OT Re-Evaluation 07/21/22    OT Start Time 0821    OT Stop Time 0900    OT Time Calculation (min) 39 min    Activity Tolerance Patient tolerated treatment well    Behavior During Therapy [facility] for tasks assessed/performed             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   Schizophrenia (HCC)    Seizures (HCC) 11/2020   Stroke (HCC)    no deficits   Tobacco use     Past Surgical History:  Procedure Laterality Date   ANTERIOR CERVICAL DECOMP/DISCECTOMY FUSION N/A 01/13/2021   Procedure: C3-5 ANTERIOR CERVICAL DECOMPRESSION/DISCECTOMY FUSION;  Surgeon: Venetia Night, MD;  Location: [facility] ORS;  Service: Neurosurgery;  Laterality: N/A;   CERVICAL SPINE SURGERY     4   CLAVICLE SURGERY     HARDWARE REMOVAL Left 04/20/2015   Procedure: HARDWARE REMOVAL left leg;  Surgeon: Deeann Saint, MD;  Location: [facility];  Service: Orthopedics;  Laterality: Left;   KNEE SURGERY Left    NECK SURGERY     x6   SHOULDER SURGERY Left    TOTAL KNEE ARTHROPLASTY Left  04/12/2021   Procedure: TOTAL KNEE ARTHROPLASTY;  Surgeon: Lyndle Herrlich, MD;  Location: [facility];  Service: Orthopedics;  Laterality: Left;   ULNAR NERVE TRANSPOSITION Right 11/17/2021   Procedure: SUBCUTANEOUS TRANSPOSITION OF ULNAR NERVE, RIGHT ELBOW;  Surgeon: Christena Flake, MD;  Location: [facility];  Service: Orthopedics;  Laterality: Right;    There were no vitals filed for this visit.   Subjective Assessment - 05/31/22 0909     Subjective  I seen the Dr and going to schedule me for nerve conduction to see if there is compression of nerve at wrist - my pain the last month worse - I  am using my hand less now because of the pain    Pertinent History 11/28/21 seen by Ortho PA- Jose Persia. is a 52 y.o. who presents today that is post right ulnar nerve transposition performed on 11/17/2021. Patient has discontinued wearing the sling and bracing that with applied at the time of surgery. He states he has had surgery on his neck and the sling was aggravating his neck. He has been working on range of motion of the elbow. His pleased with the results. Not a lot of change in  regards to numbness weakness of the upper extremity. He is complaining of a lot of pain to the arm.  Refer to PT but because of athrophy in R UE and neuropathy refer to OT - pt had C3-5 ANTERIOR CERVICAL DECOMPRESSION/DISCECTOMY FUSION 01/13/2021 Dr. 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 Patient increased pain with ulnar deviation as well as grip and wrist motion. 5/10 pain and tenderness over ulnar wrist distal to ulnar styloid. Less pain if stabilized manually by OT.            OT Treatments/Exercises (OP) - 05/31/22 0001       Moist Heat Therapy   Number Minutes Moist Heat 6 Minutes    Moist Heat Location Hand;Wrist   decrease pain to increase motion           Grip and prehension strength same as last visit   Pt arrive with 5/5 strength in shoulder and elbow -  Tinel  cont to be at  [facility] on ulnar wrist this date- was 4-6 wks ago at proximal palm Numbness in 5th to [facility] only and ulnar side of DIP of 4th  R wrist cont to rest in RD - can tolerate PROM in UD-with pain.   Pt tender over wrist ulnar styloid and distal to it- more prominent   Use Benik neoprene during [facility] and pinching to support wrist - decrease pain  This date assessed with patient different supports for wrist.   Kinesiotape done simulated TFCC repair wrap not enough support Do a TFCC wrap fabrication using or for cast-not enough support pain still 5/10   At the end done and neoprene wrist wrap with no handpiece-with a pad on ulnar wrist patient had less pain increased digit extension with wrist neutral less pain with wrist flexion extension and ulnar deviation over the table.  With gravity. Patient was educated and fastening it up appropriately not doing too tight and causing radial nerve issues. Patient demo and verbalized understanding Patient to wear only with activities.  Until nerve conduction and follow-up with Dr. Joice Lofts          OT Education - 05/31/22 0911     Education Details progress and changes to HEP, nerve healing    Person(s) Educated Patient    Methods Explanation;Demonstration;Tactile cues;Verbal cues;Handout    Comprehension Verbal cues required;Returned demonstration;Verbalized understanding              OT Short Term Goals - 04/28/22 1345        OT SHORT TERM GOAL #1   Title Pt to be ind in use of R wrist splint and WB HEP to increase UD and wrist ext with digits extention    Baseline Kept wrist in 30 degrees of RD, pain and tightness , wrist drops  into flexion with attmpts of digits extention - tightness mostly in flexors of hand and forearm NOW pt able to maintain wrist neutral with grip ,and digits extention to -30 to -35 at 5th MC and PIP. Wrist still drop into RD 30 degrees- wearing splint mostly when doing tasks around the house- 3/02/17/22 can do wrist to neutral on table and with gravity after PROM and 100% t/c    Time 6    Period Weeks    Status On-going    Target Date 06/09/22      OT SHORT TERM GOAL #2   Title R thumb AROM increase in all planes for pt to hold cup, pick up 1 cm objects and use utencils    Baseline R thumb flexion decrease - tightness in extensors- decrease PA  45 and RA  38- not able to use hand functional in ADL's NOW pt show increase PA to WNL, RA to [facility] - able to pick up objects and release - smaller objects stil hard and holding utencil    Time 4    Period Weeks    Status On-going    Target Date 05/26/22      OT SHORT TERM GOAL #3   Title R wrist  motion increase in all ranges including sup with pain less than 2/10 to turn doorknob, stabilize wrist neutral during gripping and lifting of objects    Status Achieved      OT SHORT TERM GOAL #4   Title R shoulder and elbow AROM increase to [facility] for pt use UE in ADL's with symptoms less than 3/10    Status Achieved               OT Long Term Goals - 04/28/22 1348       OT LONG TERM GOAL #1   Title R wrist UD increase to WNL on table sliding to initiated isometric  and weight to decrease pain during day less than 3/10    Baseline Wrist resting in RD- this date on table sliding after PROM and 100% t/c can slide UD to neutral -and over edge of table to neutral over table - pain increase with use during day and grip - 8/10    Time 12    Period Weeks     Status New    Target Date 07/21/22                   Plan - 05/31/22 0911     Clinical Impression Statement Pt present 6 months s/p R ulnar N transposition 11/17/21- but pt with hx of 5 cervical surgeries with last one in Nov 22 after having seizures for few days in Nov 22 prior. Pt with significant atrophy and tightness in R dominant arm.  Since start of care patient showed great progress in right shoulder,elbow and forearm AROM and strength in all planes. with 5/5 strength. Pt was seen 2 x the last 6-8 wks -his mom past away.  Tinel improving to [facility] on ulnar side of hand. Pt cont to be very motivated in sessions to improve his ROM , strength and use of R dominant UE to return to work.  Up to about 2-3 wks ago pt was able to maintain wrist neutral making grip and able to extend digits 2nd thru 4th with wrist in neutral. 5th digit extention improving. But the last 2-4 wks increase pain at ullnar wrist and unable to increase motion and strength into UD- pt keeping wrist  in RD since elbow surgery. Pt can do some UD with gravity. Pt seen Dr Joice Lofts last week and plan to have Nerve conduction at wrist for Ulnar N. Today assess kinesiotape for wrist , fabricate TFCC strap and then at the end pt had less pain with wrist neoprene wrap for wrist only with pad over ulnar wrist - pt to wear only with activities and to not fasten to tight over radial wrist. Pt verbalize understanding and to follow up after nerve conduction and appt with Dr Joice Lofts.  Pt remains limited in use of R dominant  hand and wrist  in ADL's and IADL's and continues to benefit from skiled OT services to increase ROM, strength and function.    OT Occupational Profile and History Problem Focused Assessment - Including review of records relating to presenting problem    Occupational performance deficits (Please refer to evaluation for details): ADL's;IADL's;Rest and Sleep;Play;Work;Leisure;Social Participation    Body Structure / Function /  Physical Skills ADL;Coordination;Dexterity;Decreased knowledge of use of DME;Flexibility;ROM;UE functional use;Scar mobility;Muscle spasms;Sensation;Pain;Strength;IADL    Rehab Potential Fair    Clinical Decision Making Limited treatment options, no task modification necessary    Comorbidities Affecting Occupational Performance: May have comorbidities impacting occupational performance    Modification or Assistance to Complete Evaluation  No modification of tasks or assist necessary to complete eval    OT Frequency Biweekly    OT Duration 8 weeks    OT Treatment/Interventions Self-care/ADL training;Moist Heat;Therapeutic exercise;Patient/family education;Splinting;Scar mobilization;DME and/or AE instruction;Manual Therapy;Passive range of motion    Consulted and Agree with Plan of Care Patient             Patient will benefit from skilled therapeutic intervention in order to improve the following deficits and impairments:   Body Structure / Function / Physical Skills: ADL, Coordination, Dexterity, Decreased knowledge of use of DME, Flexibility, ROM, UE functional use, Scar mobility, Muscle spasms, Sensation, Pain, Strength, IADL       Visit Diagnosis: Stiffness of right hand, not elsewhere classified  Stiffness of right wrist, not elsewhere classified  Muscle weakness (generalized)  Ulnar neuropathy at elbow of right upper extremity    Problem List Patient Active Problem List   Diagnosis Date Noted   S/P TKR (total knee replacement) using cement, left 04/12/2021   Cervical myelopathy 01/13/2021   Seizure 11/26/2020   Abnormal thyroid blood test 04/06/2016   Encounter for screening for HIV 01/05/2016   Abnormal weight gain 01/05/2016   Chronic prescription benzodiazepine use 10/19/2015   Elevated liver enzymes 08/20/2015   Medication monitoring encounter 08/20/2015   Hypokalemia 08/20/2015   Erectile dysfunction 04/22/2015   Status post hardware removal 04/20/2015    Bipolar 2 disorder 07/29/2014   Marijuana abuse 07/29/2014   Chronic knee pain 07/29/2014   Bipolar 1 disorder, mixed (HCC)    Drug abuse, opioid type 06/28/2013   GERD without esophagitis 10/03/2012   Hypertension goal BP (blood pressure) < 140/90 10/03/2012   Chronic pain associated with significant psychosocial dysfunction 07/17/2012   Lumbar canal stenosis 04/09/2012   Hypercholesterolemia 01/06/2012   CN (constipation) 11/11/2011   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/2010   Decreased motor strength 09/30/2010   Current tobacco use 09/30/2010   H/O transient cerebral ischemia 09/09/2010   LBP (low back pain) 08/30/2010   Cervical spinal cord compression 07/22/2010   Cervical post-laminectomy syndrome 07/22/2010   Chronic obstructive pulmonary disease 02/23/2010    Oletta Cohn, OTR/L,CLT 05/31/2022, 9:16 AM  [facility] [address], [place], [address] Phone: [phone]   Fax:  [phone]  Name: Scott Howell MRN: [id number] Date of Birth: 1970/06/03

## 2022-06-07 NOTE — Progress Notes (Unsigned)
Established Patient Office Visit  Subjective:  Patient ID: Scott Howell, male    DOB: Oct 29, 1970  Age: 52 y.o. MRN: [id number]  CC:  No chief complaint on file.   HPI Scott Howell presents for follow up on chronic medical conditions.   Seizures:  -Now following with Neurology, last seen on 04/04/22 -Currently on Depakote 250 mg BID and Seroquel 300 mg -No seizure like activity since last follow up   Hypertension: -Medications: Lisinopril 10 mg -Checking BP at home (average): No -Denies any SOB, CP, vision changes, LE edema or symptoms of hypotension  HLD: -Medications: Lipitor 20 mg  -Patient is compliant with above medications and reports no side effects.  -Last lipid panel: 11/22 Lipid Panel     Component Value Date/Time   CHOL 195 12/16/2020 1139   TRIG 157 (H) 12/16/2020 1139   HDL 32 (L) 12/16/2020 1139   CHOLHDL 6.1 (H) 12/16/2020 1139   VLDL 22 11/26/2020 0157   LDLCALC 134 (H) 12/16/2020 1139    COPD: -COPD status: stable -Current medications: Symbicort daily, Duonebs and Albuterol PRN -Satisfied with current treatment: yes -Oxygen use: no -Dyspnea frequency: shortness of breath occasionally, non-exertional  -Cough frequency: smoker's cough daily, white mucus -Rescue inhaler frequency: Currently using Albtuerol once-twice a day  -Limitation of activity:  sometimes -Productive cough: yes -Pneumovax: Up to date -Influenza: Up to Date   Bipolar Disorder: Not on any medications currently, had seen Psychiatry over telemedicine and was given Abilify but is not taking it. Currently on Seroquel.    GERD: -On Pepcid 20 mg daily, controlling symptoms   Health Maintenance: -Blood work due -Colon cancer screening: given Cologuard in the past but hasn't completed it -Lung cancer screening due  Past Medical History:  Diagnosis Date   Arthritis    Asthma    Bipolar 1 disorder (HCC)    COPD (chronic obstructive pulmonary disease) (HCC)    Drug abuse,  opioid type (HCC)    Dyspnea    Fibromyalgia    GERD (gastroesophageal reflux disease)    H/O ETOH abuse    Headache    High cholesterol    History of 2019 novel coronavirus disease (COVID-19) 11/01/2021   Homicidal ideation    Hypertension    Living accommodation issues    a.) as of 03/29/2021 --> living in camper   Marijuana use    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   Schizophrenia (HCC)    Seizures (HCC) 11/2020   Stroke (HCC)    no deficits   Tobacco use     Past Surgical History:  Procedure Laterality Date   ANTERIOR CERVICAL DECOMP/DISCECTOMY FUSION N/A 01/13/2021   Procedure: C3-5 ANTERIOR CERVICAL DECOMPRESSION/DISCECTOMY FUSION;  Surgeon: Venetia Night, MD;  Location: [facility];  Service: Neurosurgery;  Laterality: N/A;   CERVICAL SPINE SURGERY     4   CLAVICLE SURGERY     HARDWARE REMOVAL Left 04/20/2015   Procedure: HARDWARE REMOVAL left leg;  Surgeon: Deeann Saint, MD;  Location: [facility];  Service: Orthopedics;  Laterality: Left;   KNEE SURGERY Left    NECK SURGERY     x6   SHOULDER SURGERY Left    TOTAL KNEE ARTHROPLASTY Left 04/12/2021   Procedure: TOTAL KNEE ARTHROPLASTY;  Surgeon: Lyndle Herrlich, MD;  Location: [facility];  Service: Orthopedics;  Laterality: Left;   ULNAR NERVE TRANSPOSITION Right 11/17/2021   Procedure: SUBCUTANEOUS TRANSPOSITION OF ULNAR NERVE, RIGHT ELBOW;  Surgeon: Leron Croak  J, MD;  Location: [facility];  Service: Orthopedics;  Laterality: Right;    Family History  Problem Relation Age of Onset   Diabetes Mother    Hyperlipidemia Mother    Hypertension Mother    Diabetes Maternal Aunt    Cancer Maternal Aunt    Hyperlipidemia Maternal Aunt    Hypertension Maternal Aunt    Diabetes Maternal Uncle    Cancer Maternal Uncle    Hyperlipidemia Maternal Uncle    Hypertension Maternal Uncle     Social History   Socioeconomic History   Marital status: Single    Spouse name: Not on file   Number of children: Not on  file   Years of education: Not on file   Highest education level: Not on file  Occupational History   Not on file  Tobacco Use   Smoking status: Every Day    Packs/day: 1.50    Years: 30.00    Additional pack years: 0.00    Total pack years: 45.00    Types: Cigarettes   Smokeless tobacco: Never  Vaping Use   Vaping Use: Never used  Substance and Sexual Activity   Alcohol use: No    Comment: Sober for 10 yrs.   Drug use: Yes    Types: Marijuana   Sexual activity: Never  Other Topics Concern   Not on file  Social History Narrative   Not on file   Social Determinants of Health   Financial Resource Strain: Not on file  Food Insecurity: Not on file  Transportation Needs: Not on file  Physical Activity: Not on file  Stress: Not on file  Social Connections: Not on file  Intimate Partner Violence: Not on file    Outpatient Medications Prior to Visit  Medication Sig Dispense Refill   albuterol (VENTOLIN HFA) 108 (90 Base) MCG/ACT inhaler INHALE 2 PUFFS INTO LUNGS EVERY 6 HOURS AS NEEDED FOR WHEEZING 18 each 2   atorvastatin (LIPITOR) 20 MG tablet TAKE 1 TABLET BY MOUTH EVERY DAY 90 tablet 0   budesonide-formoterol (SYMBICORT) 160-4.5 MCG/ACT inhaler Inhale 2 puffs into the lungs 2 (two) times daily. 1 each    celecoxib (CELEBREX) 100 MG capsule Take 100 mg by mouth 2 (two) times daily.     divalproex (DEPAKOTE) 250 MG DR tablet Take 1 tablet (250 mg total) by mouth 3 (three) times daily. 90 tablet 1   famotidine (PEPCID) 20 MG tablet Take 1 tablet (20 mg total) by mouth 2 (two) times daily. 180 tablet 1   ipratropium-albuterol (DUONEB) 0.5-2.5 (3) MG/3ML SOLN Take 3 mLs by nebulization 2 (two) times daily. 360 mL    lisinopril (ZESTRIL) 10 MG tablet TAKE 1 TABLET BY MOUTH EVERY DAY 90 tablet 0   methocarbamol (ROBAXIN-750) 750 MG tablet Take 2 tablets (1,500 mg total) by mouth 2 (two) times daily as needed for muscle spasms.     oxyCODONE (ROXICODONE) 5 MG immediate release  tablet Take 1-2 tablets (5-10 mg total) by mouth every 4 (four) hours as needed for moderate pain or severe pain. 30 tablet 0   QUEtiapine (SEROQUEL) 100 MG tablet Take 1 tablet (100 mg total) by mouth at bedtime. 90 tablet 0   No facility-administered medications prior to visit.    Allergies  Allergen Reactions   Acetaminophen Other (See Comments)    Pt states that it makes him hyper.  .     ROS Review of Systems  Constitutional:  Negative for chills and fever.  Eyes:  Negative for visual disturbance.  Respiratory:  Negative for shortness of breath and wheezing.   Cardiovascular:  Negative for chest pain and leg swelling.  Gastrointestinal:  Negative for abdominal pain.  Neurological:  Negative for dizziness, seizures and headaches.      Objective:    Physical Exam Constitutional:      Appearance: Normal appearance.  HENT:     Head: Normocephalic and atraumatic.  Eyes:     Conjunctiva/sclera: Conjunctivae normal.  Cardiovascular:     Rate and Rhythm: Normal rate and regular rhythm.  Pulmonary:     Effort: Pulmonary effort is normal.     Breath sounds: Normal breath sounds.  Skin:    General: Skin is warm and dry.  Neurological:     General: No focal deficit present.     Mental Status: He is alert. Mental status is at baseline.  Psychiatric:        Mood and Affect: Mood normal.        Behavior: Behavior normal.     There were no vitals taken for this visit. Wt Readings from Last 3 Encounters:  12/02/21 217 lb 1.6 oz (98.5 kg)  11/17/21 217 lb (98.4 kg)  11/01/21 214 lb 11.2 oz (97.4 kg)     Health Maintenance Due  Topic Date Due   COVID-19 Vaccine (1) Never done   DTaP/Tdap/Td (1 - Tdap) Never done   Zoster Vaccines- Shingrix (1 of 2) Never done   Fecal DNA (Cologuard)  Never done   Lung Cancer Screening  01/08/2020    There are no preventive care reminders to display for this patient.  Lab Results  Component Value Date   TSH 0.70 12/16/2020    Lab Results  Component Value Date   WBC 9.5 11/12/2021   HGB 16.2 11/12/2021   HCT 47.3 11/12/2021   MCV 90.6 11/12/2021   PLT 338 11/12/2021   Lab Results  Component Value Date   NA 139 11/12/2021   K 4.0 11/12/2021   CO2 29 11/12/2021   GLUCOSE 93 11/12/2021   BUN 10 11/12/2021   CREATININE 0.80 11/12/2021   BILITOT 0.4 12/16/2020   ALKPHOS 49 11/25/2020   AST 23 12/16/2020   ALT 17 12/16/2020   PROT 7.0 12/16/2020   ALBUMIN 4.5 11/25/2020   CALCIUM 9.6 11/12/2021   ANIONGAP 5 11/12/2021   EGFR 109 12/16/2020   Lab Results  Component Value Date   CHOL 195 12/16/2020   Lab Results  Component Value Date   HDL 32 (L) 12/16/2020   Lab Results  Component Value Date   LDLCALC 134 (H) 12/16/2020   Lab Results  Component Value Date   TRIG 157 (H) 12/16/2020   Lab Results  Component Value Date   CHOLHDL 6.1 (H) 12/16/2020   Lab Results  Component Value Date   HGBA1C 5.8 (H) 11/26/2020      Assessment & Plan:   1. Hypertension goal BP (blood pressure) < 140/90: Chronic and stable.  Blood pressure at goal today.  Continue lisinopril 10 mg, refilled.  Follow-up in 6 months for recheck.  He will be due for annual labs at that time.  - lisinopril (ZESTRIL) 10 MG tablet; Take 1 tablet (10 mg total) by mouth at bedtime.  Dispense: 90 tablet; Refill: 1  2. Hypercholesteremia: Stable.  Doing well with this point refilled today.  - atorvastatin (LIPITOR) 20 MG tablet; Take 1 tablet (20 mg total) by mouth at bedtime.  Dispense: 90 tablet; Refill: 1  3. Chronic obstructive pulmonary disease, unspecified COPD type (HCC): Overall doing well on his consistent with his regimen.  He has been out of his inhalers for the last week or so.  Refill Symbicort albuterol and DuoNebs to use as needed.  - budesonide-formoterol (SYMBICORT) 160-4.5 MCG/ACT inhaler; Inhale 2 puffs into the lungs 2 (two) times daily.  Dispense: 1 each - ipratropium-albuterol (DUONEB) 0.5-2.5 (3) MG/3ML  SOLN; Take 3 mLs by nebulization 2 (two) times daily.  Dispense: 360 mL - albuterol (VENTOLIN HFA) 108 (90 Base) MCG/ACT inhaler; INHALE 2 PUFFS INTO LUNGS EVERY 6 HOURS AS NEEDED FOR WHEEZING  Dispense: 18 each; Refill: 2  4. GERD without esophagitis: Stable.  Symptoms well controlled.  Continue Pepcid 20 mg twice daily, refilled today.  - famotidine (PEPCID) 20 MG tablet; Take 1 tablet (20 mg total) by mouth 2 (two) times daily.  Dispense: 180 tablet; Refill: 1  5. Need for influenza vaccination: Flu vaccine administered.  - Flu Vaccine QUAD 6+ mos PF IM (Fluarix Quad PF)  Follow-up: No follow-ups on file.    Margarita Mail, DO

## 2022-06-08 ENCOUNTER — Encounter: Payer: Self-pay | Admitting: Internal Medicine

## 2022-06-08 ENCOUNTER — Ambulatory Visit: Payer: Medicaid Other | Admitting: Internal Medicine

## 2022-06-08 VITALS — BP 132/84 | HR 100 | Temp 98.3°F | Resp 16 | Ht 73.0 in | Wt 232.5 lb

## 2022-06-08 DIAGNOSIS — J449 Chronic obstructive pulmonary disease, unspecified: Secondary | ICD-10-CM

## 2022-06-08 DIAGNOSIS — I10 Essential (primary) hypertension: Secondary | ICD-10-CM

## 2022-06-08 DIAGNOSIS — E7800 Pure hypercholesterolemia, unspecified: Secondary | ICD-10-CM | POA: Diagnosis not present

## 2022-06-08 DIAGNOSIS — K219 Gastro-esophageal reflux disease without esophagitis: Secondary | ICD-10-CM

## 2022-06-08 DIAGNOSIS — Z1211 Encounter for screening for malignant neoplasm of colon: Secondary | ICD-10-CM

## 2022-06-08 DIAGNOSIS — R569 Unspecified convulsions: Secondary | ICD-10-CM

## 2022-06-08 DIAGNOSIS — Z122 Encounter for screening for malignant neoplasm of respiratory organs: Secondary | ICD-10-CM

## 2022-06-08 DIAGNOSIS — M48061 Spinal stenosis, lumbar region without neurogenic claudication: Secondary | ICD-10-CM

## 2022-06-08 MED ORDER — LISINOPRIL 10 MG PO TABS: 10.0000 mg | ORAL_TABLET | Freq: Every day | ORAL | 1 refills | Status: DC

## 2022-06-08 MED ORDER — IPRATROPIUM-ALBUTEROL 0.5-2.5 (3) MG/3ML IN SOLN
3.0000 mL | Freq: Two times a day (BID) | RESPIRATORY_TRACT | 1 refills | Status: AC
Start: 2022-06-08 — End: ?

## 2022-06-08 MED ORDER — ATORVASTATIN CALCIUM 20 MG PO TABS: 20.0000 mg | ORAL_TABLET | Freq: Every day | ORAL | 1 refills | Status: DC

## 2022-06-08 MED ORDER — FAMOTIDINE 20 MG PO TABS: 20.0000 mg | ORAL_TABLET | Freq: Two times a day (BID) | ORAL | 1 refills | Status: DC

## 2022-06-08 MED ORDER — BUDESONIDE-FORMOTEROL FUMARATE 160-4.5 MCG/ACT IN AERO
2.0000 | INHALATION_SPRAY | Freq: Two times a day (BID) | RESPIRATORY_TRACT | 3 refills | Status: DC
Start: 2022-06-08 — End: 2022-12-08

## 2022-06-09 ENCOUNTER — Other Ambulatory Visit: Payer: Self-pay

## 2022-06-09 ENCOUNTER — Telehealth: Payer: Self-pay

## 2022-06-09 DIAGNOSIS — Z1211 Encounter for screening for malignant neoplasm of colon: Secondary | ICD-10-CM

## 2022-06-09 LAB — CBC WITH DIFFERENTIAL/PLATELET
Absolute Monocytes: 496 cells/uL (ref 200–950)
Basophils Absolute: 61 cells/uL (ref 0–200)
Basophils Relative: 0.9 %
Eosinophils Absolute: 136 cells/uL (ref 15–500)
Eosinophils Relative: 2 %
HCT: 42.8 % (ref 38.5–50.0)
Hemoglobin: 14.5 g/dL (ref 13.2–17.1)
Lymphs Abs: 2196 cells/uL (ref 850–3900)
MCH: 30.2 pg (ref 27.0–33.0)
MCHC: 33.9 g/dL (ref 32.0–36.0)
MCV: 89.2 fL (ref 80.0–100.0)
MPV: 9.8 fL (ref 7.5–12.5)
Monocytes Relative: 7.3 %
Neutro Abs: 3910 cells/uL (ref 1500–7800)
Neutrophils Relative %: 57.5 %
Platelets: 274 10*3/uL (ref 140–400)
RBC: 4.8 10*6/uL (ref 4.20–5.80)
RDW: 13 % (ref 11.0–15.0)
Total Lymphocyte: 32.3 %
WBC: 6.8 10*3/uL (ref 3.8–10.8)

## 2022-06-09 LAB — COMPLETE METABOLIC PANEL WITH GFR
AG Ratio: 1.5 (calc) (ref 1.0–2.5)
ALT: 11 U/L (ref 9–46)
AST: 14 U/L (ref 10–35)
Albumin: 4.3 g/dL (ref 3.6–5.1)
Alkaline phosphatase (APISO): 62 U/L (ref 35–144)
BUN: 8 mg/dL (ref 7–25)
CO2: 28 mmol/L (ref 20–32)
Calcium: 9.9 mg/dL (ref 8.6–10.3)
Chloride: 104 mmol/L (ref 98–110)
Creat: 0.87 mg/dL (ref 0.70–1.30)
Globulin: 2.9 g/dL (calc) (ref 1.9–3.7)
Glucose, Bld: 88 mg/dL (ref 65–99)
Potassium: 4.9 mmol/L (ref 3.5–5.3)
Sodium: 140 mmol/L (ref 135–146)
Total Bilirubin: 0.2 mg/dL (ref 0.2–1.2)
Total Protein: 7.2 g/dL (ref 6.1–8.1)
eGFR: 104 mL/min/{1.73_m2} (ref >=60)

## 2022-06-09 LAB — LIPID PANEL
Cholesterol: 176 mg/dL (ref <200)
HDL: 38 mg/dL — ABNORMAL LOW (ref >=40)
LDL Cholesterol (Calc): 108 mg/dL (calc) — ABNORMAL HIGH
Non-HDL Cholesterol (Calc): 138 mg/dL (calc) — ABNORMAL HIGH (ref <130)
Total CHOL/HDL Ratio: 4.6 (calc) (ref <5.0)
Triglycerides: 181 mg/dL — ABNORMAL HIGH (ref <150)

## 2022-06-09 MED ORDER — ATORVASTATIN CALCIUM 40 MG PO TABS
40.0000 mg | ORAL_TABLET | Freq: Every day | ORAL | 1 refills | Status: DC
Start: 2022-06-09 — End: 2022-11-18

## 2022-06-09 MED ORDER — NA SULFATE-K SULFATE-MG SULF 17.5-3.13-1.6 GM/177ML PO SOLN: 1.0000 | Freq: Once | ORAL | 0 refills | Status: AC

## 2022-06-09 NOTE — Addendum Note (Signed)
Addended by: Margarita Mail on: 06/09/2022 07:54 AM   Modules accepted: Orders

## 2022-06-09 NOTE — Telephone Encounter (Signed)
Gastroenterology Pre-Procedure Review  Request Date: 06/29/22 Requesting Physician: Dr. Tobi Bastos  PATIENT REVIEW QUESTIONS: The patient responded to the following health history questions as indicated:    1. Are you having any GI issues? no 2. Do you have a personal history of Polyps? no 3. Do you have a family history of Colon Cancer or Polyps? no 4. Diabetes Mellitus? no 5. Joint replacements in the past 12 months?no 6. Major health problems in the past 3 months?no 7. Any artificial heart valves, MVP, or defibrillator?no    MEDICATIONS & ALLERGIES:    Patient reports the following regarding taking any anticoagulation/antiplatelet therapy:   Plavix, Coumadin, Eliquis, Xarelto, Lovenox, Pradaxa, Brilinta, or Effient? no Aspirin? no  Patient confirms/reports the following medications:  Current Outpatient Medications  Medication Sig Dispense Refill   albuterol (VENTOLIN HFA) 108 (90 Base) MCG/ACT inhaler INHALE 2 PUFFS INTO LUNGS EVERY 6 HOURS AS NEEDED FOR WHEEZING 18 each 2   atorvastatin (LIPITOR) 40 MG tablet Take 1 tablet (40 mg total) by mouth daily. 90 tablet 1   budesonide-formoterol (SYMBICORT) 160-4.5 MCG/ACT inhaler Inhale 2 puffs into the lungs 2 (two) times daily. 1 each 3   celecoxib (CELEBREX) 100 MG capsule Take 100 mg by mouth 2 (two) times daily.     divalproex (DEPAKOTE) 250 MG DR tablet Take 1 tablet (250 mg total) by mouth 3 (three) times daily. 90 tablet 1   famotidine (PEPCID) 20 MG tablet Take 1 tablet (20 mg total) by mouth 2 (two) times daily. 180 tablet 1   ipratropium-albuterol (DUONEB) 0.5-2.5 (3) MG/3ML SOLN Take 3 mLs by nebulization 2 (two) times daily. 360 mL 1   lisinopril (ZESTRIL) 10 MG tablet Take 1 tablet (10 mg total) by mouth daily. 90 tablet 1   methocarbamol (ROBAXIN-750) 750 MG tablet Take 2 tablets (1,500 mg total) by mouth 2 (two) times daily as needed for muscle spasms.     oxyCODONE (ROXICODONE) 5 MG immediate release tablet Take 1-2 tablets  (5-10 mg total) by mouth every 4 (four) hours as needed for moderate pain or severe pain. 30 tablet 0   QUEtiapine (SEROQUEL) 100 MG tablet Take 1 tablet (100 mg total) by mouth at bedtime. 90 tablet 0   No current facility-administered medications for this visit.    Patient confirms/reports the following allergies:  Allergies  Allergen Reactions   Acetaminophen Other (See Comments)    Pt states that it makes him hyper.  .     No orders of the defined types were placed in this encounter.   AUTHORIZATION INFORMATION Primary Insurance: 1D#: Group #:  Secondary Insurance: 1D#: Group #:  SCHEDULE INFORMATION: Date: 06/29/22 Time: Location: [facility]

## 2022-06-19 ENCOUNTER — Other Ambulatory Visit: Payer: Self-pay | Admitting: Neurosurgery

## 2022-06-21 NOTE — Progress Notes (Unsigned)
Referring Physician:  Margar[place] Mail, DO [address] [place],  [place] [address]  Primary Physician:  Margarita Mail, DO  History of Present Illness: 06/22/2022 [name] has a history of seizures, HTN, hyperlipidemia, COPD, bipolar, FM, history of ETOH abuse, and GERD.   History of ACDF C3-C5 for myelopathy on 01/13/21 with Dr. Myer Haff. History of dysfunction in right hand due to history of multiple fractures.   3-4 months of intermittent neck pain that is worse with turning his head. He has intermittent bilateral arm pain into his hands. He has numbness, tingling, and weakness in his hands.   He feels like the pain from his neck shoots down into his lower back. He has constant LBP with intermittent bilateral leg pain to his knees. Pain is much worse at night (after 7pm). He has numbness, tingling, and weakness in his legs.   Bowel/Bladder Dysfunction: none  He smokes 1 and 1/2 ppd x 30 years.   Conservative measures:  Physical therapy: no recent Multimodal medical therapy including regular antiinflammatories: celebrex, robaxin, oxycodone  Injections: No recent epidural steroid injections  Past Surgery:  History of ACDF C3-C5 for myelopathy on 01/13/21 with Dr. Myer Haff History of 5 previous cervical fusion surgeries  Scott Howell has no symptoms of cervical myelopathy. Chronic issues with right hand. No new balance issues.   The symptoms are causing a significant impact on the patient's life.   Review of Systems:  A 10 point review of systems is negative, except for the pertinent positives and negatives detailed in the HPI.  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   Schizophrenia (HCC)    Seizures (HCC) 11/2020   Stroke (HCC)    no deficits   Tobacco use     Past Surgical History: Past Surgical History:  Procedure Laterality Date   ANTERIOR CERVICAL DECOMP/DISCECTOMY FUSION N/A 01/13/2021   Procedure: C3-5 ANTERIOR CERVICAL DECOMPRESSION/DISCECTOMY FUSION;  Surgeon: Venetia Night, MD;  Location: [facility];  Service: Neurosurgery;  Laterality: N/A;   CERVICAL SPINE SURGERY     4   CLAVICLE SURGERY     HARDWARE REMOVAL Left 04/20/2015   Procedure: HARDWARE REMOVAL left leg;  Surgeon: Deeann Saint, MD;  Location: [facility];  Service: Orthopedics;  Laterality: Left;   KNEE SURGERY Left    NECK SURGERY     x6   SHOULDER SURGERY Left    TOTAL KNEE ARTHROPLASTY Left 04/12/2021   Procedure: TOTAL KNEE ARTHROPLASTY;  Surgeon: Lyndle Herrlich, MD;  Location: [facility];  Service: Orthopedics;  Laterality: Left;   ULNAR NERVE TRANSPOSITION Right 11/17/2021   Procedure: SUBCUTANEOUS TRANSPOSITION OF ULNAR NERVE, RIGHT ELBOW;  Surgeon: Christena Flake, MD;  Location: [facility];  Service: Orthopedics;  Laterality: Right;    Allergies: Allergies as of 06/22/2022 - Review  Complete 06/22/2022  Allergen Reaction Noted   Acetaminophen Other (See Comments) 07/06/2014    Medications: Outpatient Encounter Medications as of 06/22/2022  Medication Sig   albuterol (VENTOLIN HFA) 108 (90 Base) MCG/ACT inhaler INHALE 2 PUFFS INTO LUNGS EVERY 6 HOURS AS NEEDED FOR WHEEZING   atorvastatin (LIPITOR) 40 MG tablet Take 1 tablet (40 mg total) by mouth daily.   budesonide-formoterol (SYMBICORT) 160-4.5 MCG/ACT inhaler Inhale 2 puffs into the lungs 2 (two) times daily.   celecoxib (CELEBREX) 100 MG capsule Take 100  mg by mouth 2 (two) times daily.   divalproex (DEPAKOTE) 250 MG DR tablet Take 1 tablet (250 mg total) by mouth 3 (three) times daily.   famotidine (PEPCID) 20 MG tablet Take 1 tablet (20 mg total) by mouth 2 (two) times daily.   ipratropium-albuterol (DUONEB) 0.5-2.5 (3) MG/3ML SOLN Take 3 mLs by nebulization 2 (two) times daily.   lisinopril (ZESTRIL) 10 MG tablet Take 1 tablet (10 mg total) by mouth daily.   QUEtiapine (SEROQUEL) 100 MG tablet Take 1 tablet (100 mg total) by mouth at bedtime.   [DISCONTINUED] methocarbamol (ROBAXIN-750) 750 MG tablet Take 2 tablets (1,500 mg total) by mouth 2 (two) times daily as needed for muscle spasms. (Patient not taking: Reported on 06/09/2022)   [DISCONTINUED] oxyCODONE (ROXICODONE) 5 MG immediate release tablet Take 1-2 tablets (5-10 mg total) by mouth every 4 (four) hours as needed for moderate pain or severe pain. (Patient not taking: Reported on 06/09/2022)   No facility-administered encounter medications on file as of 06/22/2022.    Social History: Social History   Tobacco Use   Smoking status: Every Day    Packs/day: 1.50    Years: 30.00    Additional pack years: 0.00    Total pack years: 45.00    Types: Cigarettes   Smokeless tobacco: Never  Vaping Use   Vaping Use: Never used  Substance Use Topics   Alcohol use: No    Comment: Sober for 10 yrs.   Drug use: Yes    Types: Marijuana    Family Medical History: Family History  Problem Relation Age of Onset   Diabetes Mother    Hyperlipidemia Mother    Hypertension Mother    Diabetes Maternal Aunt    Cancer Maternal Aunt    Hyperlipidemia Maternal Aunt    Hypertension Maternal Aunt    Diabetes Maternal Uncle    Cancer Maternal Uncle    Hyperlipidemia Maternal Uncle    Hypertension Maternal Uncle     Physical Examination: Vitals:   06/22/22 1420  BP: 130/78    General: Patient is well developed, well nourished, calm, collected, and in no apparent distress. Attention to  examination is appropriate.  Respiratory: Patient is breathing without any difficulty.   NEUROLOGICAL:     Awake, alert, oriented to person, place, and time.  Speech is clear and fluent. Fund of knowledge is appropriate.   Cranial Nerves: Pupils equal round and reactive to light.  Facial tone is symmetric.    Limited ROM of cervical spine with pain Minimal posterior cervical tenderness. Well healed posterior cervical incision.   Mild diffuse posterior lumbar tenderness.    No abnormal lesions on exposed skin.   Strength: Side Biceps Triceps Deltoid Interossei Grip Wrist Ext. Wrist Flex.  R 5 5 5 5 5 5  --  L 5 5 5 5 5 5 5    Side Iliopsoas Quads Hamstring PF DF EHL  R 5 5 5 5 5 5   L  5 5 5 5 5 5    Reflexes are 2+ and symmetric at the biceps, triceps, brachioradialis, patella and achilles.   Hoffman's is positive in both hands.    Bilateral upper and lower extremity sensation is intact to light touch, but diminished in medial calf bilaterally.   Limited use of right small/ring finger. He has difficulty with right wrist flexion.   He has a slow limping gait.   Medical Decision Making  Imaging: No recent cervical or lumbar imaging available.   Assessment and Plan: Mr. Juhnke is a pleasant 52 y.o. male has History of ACDF C3-C5 for myelopathy on 01/13/21 with Dr. Myer Haff. History of 4-5 previous cervical surgeries as well.   He has 3-4 months of intermittent neck pain that is worse with turning his head. He has intermittent bilateral arm pain into his hands. He has numbness, tingling, and weakness in his hands.   He feels like the pain from his neck shoots down into his lower back. He has constant LBP with intermittent bilateral leg pain to his knees. He has numbness, tingling, and weakness in his legs.   He does not have any recent cervical or lumbar imaging. Neck and arm pain are likely cervical mediated. LBP and leg pain are likely lumbar mediated.   Treatment options  discussed with patient and following plan made:   - MRI of cervical spine ordered to evaluate cervical radiculopathy. He was doing well and started with new symptoms 3-4 months ago.  - MRI of lumbar spine to further evaluate lumbar radiculopathy.  - Follow up with me in clinic after his MRIs are done to review the results.   I spent a total of 40 minutes in face-to-face and non-face-to-face activities related to this patient's care today including review of outside records, review of imaging, review of symptoms, physical exam, discussion of differential diagnosis, discussion of treatment options, and documentation.   Thank you for involving me in the care of this patient.   Drake Leach PA-C Dept. of Neurosurgery

## 2022-06-22 ENCOUNTER — Ambulatory Visit (INDEPENDENT_AMBULATORY_CARE_PROVIDER_SITE_OTHER): Payer: Medicaid Other | Admitting: Orthopedic Surgery

## 2022-06-22 ENCOUNTER — Encounter: Payer: Self-pay | Admitting: Orthopedic Surgery

## 2022-06-22 VITALS — BP 130/78 | Ht 73.0 in | Wt 227.2 lb

## 2022-06-22 DIAGNOSIS — M542 Cervicalgia: Secondary | ICD-10-CM | POA: Diagnosis not present

## 2022-06-22 DIAGNOSIS — M5442 Lumbago with sciatica, left side: Secondary | ICD-10-CM | POA: Diagnosis not present

## 2022-06-22 DIAGNOSIS — M5412 Radiculopathy, cervical region: Secondary | ICD-10-CM

## 2022-06-22 DIAGNOSIS — M5416 Radiculopathy, lumbar region: Secondary | ICD-10-CM

## 2022-06-22 DIAGNOSIS — M4722 Other spondylosis with radiculopathy, cervical region: Secondary | ICD-10-CM

## 2022-06-22 DIAGNOSIS — M5441 Lumbago with sciatica, right side: Secondary | ICD-10-CM

## 2022-06-22 DIAGNOSIS — Z981 Arthrodesis status: Secondary | ICD-10-CM

## 2022-06-22 DIAGNOSIS — M47812 Spondylosis without myelopathy or radiculopathy, cervical region: Secondary | ICD-10-CM

## 2022-06-22 NOTE — Progress Notes (Signed)
Patient given arrival time b/c he was responsible for arranging transportation with medicaid 5 days in advance.  I told him nothing was set yet but estimated 1100 am. Wednesday May 15.  He claimed he had talked to now 3 people that said it was ok for transportation to drop him off and pick him up without anyone accommodating him. I told him that was not allowed here but he insisted.  He asked that if he was not allowed to do it we should let him know within 24 hours

## 2022-06-22 NOTE — Patient Instructions (Signed)
It was so nice to see you today. Thank you so much for coming in.    I want to get an MRI of your neck and lower back to look into things further. We will get this approved through your insurance and [facility] will call you to schedule the appointment.   Once I have the results, we will call to schedule  a follow up so we can review them.   Please do not hesitate to call if you have any questions or concerns. You can also message me in MyChart.  Drake Leach PA-C [phone]

## 2022-06-29 ENCOUNTER — Encounter
Admission: RE | Disposition: A | Discharge status: Home / Self Care | Payer: Self-pay | Source: Home / Self Care | Attending: Gastroenterology

## 2022-06-29 ENCOUNTER — Ambulatory Visit
Admission: RE | Admit: 2022-06-29 | Discharge: 2022-06-29 | Disposition: A | Discharge status: Home / Self Care | Payer: Medicaid Other | Attending: Gastroenterology | Admitting: Gastroenterology

## 2022-06-29 ENCOUNTER — Ambulatory Visit: Payer: Medicaid Other | Admitting: Anesthesiology

## 2022-06-29 DIAGNOSIS — D125 Benign neoplasm of sigmoid colon: Secondary | ICD-10-CM

## 2022-06-29 DIAGNOSIS — D124 Benign neoplasm of descending colon: Secondary | ICD-10-CM | POA: Diagnosis not present

## 2022-06-29 DIAGNOSIS — Z6828 Body mass index (BMI) 28.0-28.9, adult: Secondary | ICD-10-CM | POA: Insufficient documentation

## 2022-06-29 DIAGNOSIS — F17200 Nicotine dependence, unspecified, uncomplicated: Secondary | ICD-10-CM | POA: Diagnosis not present

## 2022-06-29 DIAGNOSIS — K219 Gastro-esophageal reflux disease without esophagitis: Secondary | ICD-10-CM | POA: Insufficient documentation

## 2022-06-29 DIAGNOSIS — R569 Unspecified convulsions: Secondary | ICD-10-CM | POA: Insufficient documentation

## 2022-06-29 DIAGNOSIS — R519 Headache, unspecified: Secondary | ICD-10-CM | POA: Diagnosis not present

## 2022-06-29 DIAGNOSIS — Z1211 Encounter for screening for malignant neoplasm of colon: Secondary | ICD-10-CM | POA: Diagnosis not present

## 2022-06-29 DIAGNOSIS — R0602 Shortness of breath: Secondary | ICD-10-CM | POA: Insufficient documentation

## 2022-06-29 DIAGNOSIS — I10 Essential (primary) hypertension: Secondary | ICD-10-CM | POA: Insufficient documentation

## 2022-06-29 DIAGNOSIS — F319 Bipolar disorder, unspecified: Secondary | ICD-10-CM | POA: Diagnosis not present

## 2022-06-29 DIAGNOSIS — D126 Benign neoplasm of colon, unspecified: Secondary | ICD-10-CM

## 2022-06-29 DIAGNOSIS — K640 First degree hemorrhoids: Secondary | ICD-10-CM | POA: Diagnosis not present

## 2022-06-29 DIAGNOSIS — M797 Fibromyalgia: Secondary | ICD-10-CM | POA: Insufficient documentation

## 2022-06-29 DIAGNOSIS — J449 Chronic obstructive pulmonary disease, unspecified: Secondary | ICD-10-CM | POA: Insufficient documentation

## 2022-06-29 DIAGNOSIS — M1990 Unspecified osteoarthritis, unspecified site: Secondary | ICD-10-CM | POA: Diagnosis not present

## 2022-06-29 DIAGNOSIS — E669 Obesity, unspecified: Secondary | ICD-10-CM | POA: Diagnosis not present

## 2022-06-29 DIAGNOSIS — D123 Benign neoplasm of transverse colon: Secondary | ICD-10-CM

## 2022-06-29 DIAGNOSIS — D122 Benign neoplasm of ascending colon: Secondary | ICD-10-CM

## 2022-06-29 DIAGNOSIS — E7800 Pure hypercholesterolemia, unspecified: Secondary | ICD-10-CM | POA: Insufficient documentation

## 2022-06-29 HISTORY — PX: COLONOSCOPY WITH PROPOFOL: SHX5780

## 2022-06-29 SURGERY — COLONOSCOPY WITH PROPOFOL
Anesthesia: General

## 2022-06-29 MED ORDER — SODIUM CHLORIDE 0.9 % IV SOLN
INTRAVENOUS | Status: DC
  Administered 2022-06-29: 20 mL/h via INTRAVENOUS

## 2022-06-29 MED ORDER — LIDOCAINE HCL (CARDIAC) PF 100 MG/5ML IV SOSY
PREFILLED_SYRINGE | INTRAVENOUS | Status: DC | PRN
  Administered 2022-06-29: 50 mg via INTRAVENOUS

## 2022-06-29 MED ORDER — PROPOFOL 10 MG/ML IV BOLUS
INTRAVENOUS | Status: DC | PRN
  Administered 2022-06-29: 50 mg via INTRAVENOUS
  Administered 2022-06-29: 100 mg via INTRAVENOUS

## 2022-06-29 MED ORDER — PROPOFOL 500 MG/50ML IV EMUL
INTRAVENOUS | Status: DC | PRN
  Administered 2022-06-29: 150 ug/kg/min via INTRAVENOUS

## 2022-06-29 MED ORDER — DEXMEDETOMIDINE HCL IN NACL 80 MCG/20ML IV SOLN
INTRAVENOUS | Status: DC | PRN
  Administered 2022-06-29: 12 ug via INTRAVENOUS

## 2022-06-29 NOTE — Anesthesia Procedure Notes (Signed)
Date/Time: 06/29/2022 11:10 AM  Performed by: Ginger Carne, CRNAPre-anesthesia Checklist: Patient identified, Emergency Drugs available, Suction available, Patient being monitored and Timeout performed Patient Re-evaluated:Patient Re-evaluated prior to induction Oxygen Delivery Method: Nasal cannula Preoxygenation: Pre-oxygenation with 100% oxygen

## 2022-06-29 NOTE — Anesthesia Preprocedure Evaluation (Signed)
Anesthesia Evaluation  Patient identified by MRN, date of birth, ID band Patient awake    Reviewed: Allergy & Precautions, NPO status , Patient's Chart, lab work & pertinent test results  History of Anesthesia Complications Negative for: history of anesthetic complications  Airway Mallampati: III  TM Distance: <3 FB Neck ROM: full    Dental  (+) Missing   Pulmonary shortness of breath and with exertion, asthma , COPD, Current Smoker and Patient abstained from smoking.   Pulmonary exam normal        Cardiovascular Exercise Tolerance: Good hypertension, (-) angina Normal cardiovascular exam     Neuro/Psych  Headaches, Seizures -,  PSYCHIATRIC DISORDERS   Bipolar Disorder Schizophrenia   Neuromuscular disease CVA    GI/Hepatic Neg liver ROS,GERD  Controlled,,  Endo/Other  negative endocrine ROS    Renal/GU      Musculoskeletal  (+) Arthritis ,    Abdominal  (+) + obese  Peds  Hematology negative hematology ROS (+)   Anesthesia Other Findings Past Medical History: No date: Arthritis No date: Asthma No date: Bipolar 1 disorder (HCC) No date: COPD (chronic obstructive pulmonary disease) (HCC) No date: Drug abuse, opioid type (HCC) No date: Dyspnea No date: Fibromyalgia No date: GERD (gastroesophageal reflux disease) No date: H/O ETOH abuse No date: Headache No date: High cholesterol 11/01/2021: History of 2019 novel coronavirus disease (COVID-19) No date: Homicidal ideation No date: Hypertension No date: Living accommodation issues     Comment:  a.) as of 03/29/2021 --> living in camper No date: Marijuana use No date: 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 No date: Schizophrenia (HCC) 11/2020: Seizures (HCC) No date: Stroke [facility])     Comment:  no deficits No date: Tobacco use  Past Surgical History: 01/13/2021: ANTERIOR CERVICAL DECOMP/DISCECTOMY FUSION; N/A     Comment:  Procedure: C3-5  ANTERIOR CERVICAL               DECOMPRESSION/DISCECTOMY FUSION;  Surgeon: Venetia Night, MD;  Location: [facility] OR[facility];  Service: Neurosurgery;              Laterality: N/A; No date: CERVICAL SPINE SURGERY     Comment:  4 No date: CLAVICLE SURGERY 04/20/2015: HARDWARE REMOVAL; Left     Comment:  Procedure: HARDWARE REMOVAL left leg;  Surgeon: Deeann Saint, MD;  Location: [facility];  Service: Orthopedics;                Laterality: Left; No date: KNEE SURGERY; Left No date: NECK SURGERY     Comment:  x6 No date: SHOULDER SURGERY; Left 04/12/2021: TOTAL KNEE ARTHROPLASTY; Left     Comment:  Procedure: TOTAL KNEE ARTHROPLASTY;  Surgeon: Lyndle Herrlich, MD;  Location: [facility];  Service: Orthopedics;               Laterality: Left;  BMI    Body Mass Index: 28.63 kg/m      Reproductive/Obstetrics negative OB ROS                             Anesthesia Physical Anesthesia Plan  ASA: 3  Anesthesia Plan: General   Post-op Pain Management:  Induction: Intravenous  PONV Risk Score and Plan: Propofol infusion and TIVA  Airway Management Planned: Natural Airway  Additional Equipment:   Intra-op Plan:   Post-operative Plan: Extubation in OR  Informed Consent: I have reviewed the patients History and Physical, chart, labs and discussed the procedure including the risks, benefits and alternatives for the proposed anesthesia with the patient or authorized representative who has indicated his/her understanding and acceptance.     Dental Advisory Given  Plan Discussed with: Anesthesiologist, CRNA and Surgeon  Anesthesia Plan Comments:         Anesthesia Quick Evaluation

## 2022-06-29 NOTE — H&P (Signed)
Wyline Mood, MD [address], [place], [place], [address] [address], [address], [place], [place], [address] Phone: [phone]  Fax: [phone]  Primary Care Physician:  Margarita Mail, DO   Pre-Procedure History & Physical: HPI:  Scott Howell is a 52 y.o. male is here for an colonoscopy.   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   Schizophrenia (HCC)    Seizures (HCC) 11/2020   Stroke (HCC)    no deficits   Tobacco use     Past Surgical History:  Procedure Laterality Date   ANTERIOR CERVICAL DECOMP/DISCECTOMY FUSION N/A 01/13/2021   Procedure: C3-5 ANTERIOR CERVICAL DECOMPRESSION/DISCECTOMY FUSION;  Surgeon: Venetia Night, MD;  Location: [facility];  Service: Neurosurgery;  Laterality: N/A;   CERVICAL SPINE SURGERY     4   CLAVICLE SURGERY     HARDWARE REMOVAL Left 04/20/2015   Procedure: HARDWARE REMOVAL left leg;  Surgeon: Deeann Saint, MD;  Location: [facility];  Service: Orthopedics;  Laterality: Left;   KNEE SURGERY Left    NECK SURGERY     x6   SHOULDER SURGERY Left    TOTAL KNEE ARTHROPLASTY Left 04/12/2021   Procedure: TOTAL KNEE ARTHROPLASTY;  Surgeon: Lyndle Herrlich, MD;  Location: [facility];  Service: Orthopedics;  Laterality: Left;   ULNAR NERVE TRANSPOSITION Right 11/17/2021   Procedure: SUBCUTANEOUS TRANSPOSITION OF ULNAR NERVE, RIGHT ELBOW;  Surgeon: Christena Flake, MD;  Location: [facility];  Service: Orthopedics;  Laterality:  Right;    Prior to Admission medications   Medication Sig Start Date End Date Taking? Authorizing Provider  albuterol (VENTOLIN HFA) 108 (90 Base) MCG/ACT inhaler INHALE 2 PUFFS INTO LUNGS EVERY 6 HOURS AS NEEDED FOR WHEEZING 02/22/22   Margarita Mail, DO  atorvastatin (LIPITOR) 40 MG tablet Take 1 tablet (40 mg total) by mouth daily. 06/09/22   Margarita Mail, DO  budesonide-formoterol [facility] 160-4.5 MCG/ACT inhaler Inhale 2 puffs into the lungs 2 (two) times daily. 06/08/22   Margarita Mail, DO  celecoxib (CELEBREX) 100 MG capsule Take 100 mg by mouth 2 (two) times daily.    [provider]  divalproex (DEPAKOTE) 250 MG DR tablet Take 1 tablet (250 mg total) by mouth 3 (three) times daily. 04/24/21   Malva Limes, MD  famotidine (PEPCID) 20 MG tablet Take 1 tablet (20 mg total) by mouth 2 (two) times daily. 06/08/22   Margarita Mail, DO  ipratropium-albuterol (DUONEB) 0.5-2.5 (3) MG/3ML SOLN Take 3 mLs by nebulization 2 (two) times daily. 06/08/22   Margarita Mail, DO  lisinopril (ZESTRIL) 10 MG tablet Take 1 tablet (10 mg total) by mouth daily. 06/08/22   Margarita Mail, DO  QUEtiapine (SEROQUEL) 100 MG tablet Take 1 tablet (100 mg total) by mouth at bedtime. 02/25/22   Margarita Mail, DO    Allergies  as of 06/09/2022 - Review Complete 06/08/2022  Allergen Reaction Noted   Acetaminophen Other (See Comments) 07/06/2014    Family History  Problem Relation Age of Onset   Diabetes Mother    Hyperlipidemia Mother    Hypertension Mother    Diabetes Maternal Aunt    Cancer Maternal Aunt    Hyperlipidemia Maternal Aunt    Hypertension Maternal Aunt    Diabetes Maternal Uncle    Cancer Maternal Uncle    Hyperlipidemia Maternal Uncle    Hypertension Maternal Uncle     Social History   Socioeconomic History   Marital status: Single    Spouse name: Not on file   Number of children: Not on file   Years of education: Not on file   Highest education  level: Not on file  Occupational History   Not on file  Tobacco Use   Smoking status: Every Day    Packs/day: 1.50    Years: 30.00    Additional pack years: 0.00    Total pack years: 45.00    Types: Cigarettes   Smokeless tobacco: Never  Vaping Use   Vaping Use: Never used  Substance and Sexual Activity   Alcohol use: No    Comment: Sober for 10 yrs.   Drug use: Yes    Types: Marijuana   Sexual activity: Never  Other Topics Concern   Not on file  Social History Narrative   Not on file   Social Determinants of Health   Financial Resource Strain: Not on file  Food Insecurity: Not on file  Transportation Needs: Not on file  Physical Activity: Not on file  Stress: Not on file  Social Connections: Not on file  Intimate Partner Violence: Not on file    Review of Systems: See HPI, otherwise negative ROS  Physical Exam: There were no vitals taken for this visit. General:   Alert,  pleasant and cooperative in NAD Head:  Normocephalic and atraumatic. Neck:  Supple; no masses or thyromegaly. Lungs:  Clear throughout to auscultation, normal respiratory effort.    Heart:  +S1, +S2, Regular rate and rhythm, No edema. Abdomen:  Soft, nontender and nondistended. Normal bowel sounds, without guarding, and without rebound.   Neurologic:  Alert and  oriented x4;  grossly normal neurologically.  Impression/Plan: Scott Howell is here for an colonoscopy to be performed for Screening colonoscopy average risk   Risks, benefits, limitations, and alternatives regarding  colonoscopy have been reviewed with the patient.  Questions have been answered.  All parties agreeable.   Wyline Mood, MD  06/29/2022, 10:32 AM

## 2022-06-29 NOTE — Transfer of Care (Signed)
Immediate Anesthesia Transfer of Care Note  Patient: Scott Howell  Procedure(s) Performed: COLONOSCOPY WITH PROPOFOL  Patient Location: Endoscopy Unit  Anesthesia Type:General  Level of Consciousness: awake, alert , and oriented  Airway & Oxygen Therapy: Patient Spontanous Breathing  Post-op Assessment: Report given to RN and Post -op Vital signs reviewed and stable  Post vital signs: Reviewed and stable  Last Vitals:  Vitals Value Taken Time  BP 89/70 06/29/22 1148  Temp    Pulse 104 06/29/22 1148  Resp 13 06/29/22 1148  SpO2 99 % 06/29/22 1148  Vitals shown include unvalidated device data.  Last Pain:  Vitals:   06/29/22 1044  TempSrc: Temporal  PainSc: 0-No pain         Complications: No notable events documented.

## 2022-06-29 NOTE — Op Note (Signed)
[facility] Patient Name: Scott Howell Procedure Date: 06/29/2022 10:35 AM MRN: [id number] Account #: [id number] Date of Birth: Jun 18, 1970 Admit Type: Outpatient Age: 52 Room: [facility], [place] ENDO ROOM 3 Gender: Male Note Status: Finalized Instrument Name: Prentice Docker [id number] Procedure:             Colonoscopy Indications:           Screening for colorectal malignant neoplasm Providers:             Wyline Mood MD, MD Medicines:             Monitored Anesthesia Care Complications:         No immediate complications. Procedure:             Pre-Anesthesia Assessment:                        - Prior to the procedure, a History and Physical was                         performed, and patient medications, allergies and                         sensitivities were reviewed. The patient's tolerance                         of previous anesthesia was reviewed.                        - The risks and benefits of the procedure and the                         sedation options and risks were discussed with the                         patient. All questions were answered and informed                         consent was obtained.                        - ASA Grade Assessment: II - A patient with mild                         systemic disease.                        After obtaining informed consent, the colonoscope was                         passed under direct vision. Throughout the procedure,                         the patient's blood pressure, pulse, and oxygen                         saturations were monitored continuously. The                         Colonoscope was introduced through the anus and  advanced to the the cecum, identified by the                         appendiceal orifice. The colonoscopy was performed                         with ease. The patient tolerated the procedure well.                         The quality of the bowel  preparation was excellent.                         The ileocecal valve, appendiceal orifice, and rectum                         were photographed. Findings:      The perianal and digital rectal examinations were normal.      A 6 mm polyp was found in the descending colon. The polyp was sessile.       The polyp was removed with a cold snare. Resection and retrieval were       complete.      A 15 mm polyp was found in the sigmoid colon. The polyp was       pedunculated. The polyp was removed with a hot snare. Resection and       retrieval were complete.      A 6 mm polyp was found in the transverse colon. The polyp was sessile.       The polyp was removed with a cold snare. Resection and retrieval were       complete.      A 20 mm polyp was found in the transverse colon. The polyp was       semi-pedunculated. Preparations were made for mucosal resection.       Demarcation of the lesion was performed with narrow band imaging to       clearly identify the boundaries of the lesion. Saline was injected to       raise the lesion. Snare mucosal resection was performed. Resection and       retrieval were complete. Resected tissue margins were examined and clear       of polyp tissue. To prevent bleeding after mucosal resection, one       hemostatic clip was successfully placed. There was no bleeding during,       or at the end, of the procedure.      A 5 mm polyp was found in the ascending colon. The polyp was sessile.       The polyp was removed with a jumbo cold forceps. Resection and retrieval       were complete.      Non-bleeding internal hemorrhoids were found during retroflexion. The       hemorrhoids were large and Grade I (internal hemorrhoids that do not       prolapse).      The exam was otherwise without abnormality on direct and retroflexion       views. Impression:            - One 6 mm polyp in the descending colon, removed with                         a  cold snare. Resected and  retrieved.                        - One 15 mm polyp in the sigmoid colon, removed with a                         hot snare. Resected and retrieved.                        - One 6 mm polyp in the transverse colon, removed with                         a cold snare. Resected and retrieved.                        - One 20 mm polyp in the transverse colon, removed                         with mucosal resection. Resected and retrieved. Clip                         was placed.                        - One 5 mm polyp in the ascending colon, removed with                         a jumbo cold forceps. Resected and retrieved.                        - Non-bleeding internal hemorrhoids.                        - The examination was otherwise normal on direct and                         retroflexion views.                        - Mucosal resection was performed. Resection and                         retrieval were complete. Recommendation:        - Discharge patient to home (with escort).                        - Resume previous diet.                        - No ibuprofen, naproxen, or other non-steroidal                         anti-inflammatory drugs for 6 weeks after polyp                         removal. Procedure Code(s):     --- Professional ---                        [phone], Colonoscopy, flexible; with endoscopic mucosal  resection                        [id number], 59, Colonoscopy, flexible; with removal of                         tumor(s), polyp(s), or other lesion(s) by snare                         technique                        [id number], 59, Colonoscopy, flexible; with biopsy, single                         or multiple Diagnosis Code(s):     --- Professional ---                        Z12.11, Encounter for screening for malignant neoplasm                         of colon                        D12.5, Benign neoplasm of sigmoid colon                        D12.4, Benign  neoplasm of descending colon                        D12.3, Benign neoplasm of transverse colon (hepatic                         flexure or splenic flexure)                        D12.2, Benign neoplasm of ascending colon                        K64.0, First degree hemorrhoids CPT copyright 2022 [employer]. All rights reserved. The codes documented in this report are preliminary and upon coder review may  be revised to meet current compliance requirements. Wyline Mood, MD Wyline Mood MD, MD 06/29/2022 11:47:20 AM This report has been signed electronically. Number of Addenda: 0 Note Initiated On: 06/29/2022 10:35 AM Scope Withdrawal Time: 0 hours 23 minutes 9 seconds  Total Procedure Duration: 0 hours 25 minutes 59 seconds  Estimated Blood Loss:  Estimated blood loss: none.      [facility]

## 2022-06-29 NOTE — Anesthesia Postprocedure Evaluation (Signed)
Anesthesia Post Note  Patient: Scott Howell  Procedure(s) Performed: COLONOSCOPY WITH PROPOFOL  Patient location during evaluation: Endoscopy Anesthesia Type: General Level of consciousness: awake and alert Pain management: pain level controlled Vital Signs Assessment: post-procedure vital signs reviewed and stable Respiratory status: spontaneous breathing, nonlabored ventilation and respiratory function stable Cardiovascular status: blood pressure returned to baseline and stable Postop Assessment: no apparent nausea or vomiting Anesthetic complications: no   No notable events documented.   Last Vitals:  Vitals:   06/29/22 1200 06/29/22 1216  BP: (!) 106/56 121/85  Pulse: 94 90  Resp: 14 (!) 31  Temp:    SpO2: 100% 94%    Last Pain:  Vitals:   06/29/22 1200  TempSrc:   PainSc: 0-No pain                 Foye Deer

## 2022-06-30 ENCOUNTER — Encounter: Payer: Self-pay | Admitting: Gastroenterology

## 2022-06-30 ENCOUNTER — Ambulatory Visit
Admission: RE | Admit: 2022-06-30 | Discharge: 2022-06-30 | Disposition: A | Discharge status: Home / Self Care | Payer: Medicaid Other | Source: Ambulatory Visit | Attending: Orthopedic Surgery | Admitting: Orthopedic Surgery

## 2022-06-30 DIAGNOSIS — M47812 Spondylosis without myelopathy or radiculopathy, cervical region: Secondary | ICD-10-CM

## 2022-06-30 DIAGNOSIS — M542 Cervicalgia: Secondary | ICD-10-CM

## 2022-06-30 DIAGNOSIS — Z981 Arthrodesis status: Secondary | ICD-10-CM

## 2022-06-30 DIAGNOSIS — M5441 Lumbago with sciatica, right side: Secondary | ICD-10-CM

## 2022-06-30 DIAGNOSIS — M5416 Radiculopathy, lumbar region: Secondary | ICD-10-CM

## 2022-06-30 DIAGNOSIS — M5412 Radiculopathy, cervical region: Secondary | ICD-10-CM

## 2022-06-30 LAB — SURGICAL PATHOLOGY

## 2022-07-06 ENCOUNTER — Telehealth: Payer: Self-pay | Admitting: Orthopedic Surgery

## 2022-07-06 DIAGNOSIS — M5412 Radiculopathy, cervical region: Secondary | ICD-10-CM

## 2022-07-06 DIAGNOSIS — M47812 Spondylosis without myelopathy or radiculopathy, cervical region: Secondary | ICD-10-CM

## 2022-07-06 DIAGNOSIS — M5416 Radiculopathy, lumbar region: Secondary | ICD-10-CM

## 2022-07-06 DIAGNOSIS — M5441 Lumbago with sciatica, right side: Secondary | ICD-10-CM

## 2022-07-06 DIAGNOSIS — Z981 Arthrodesis status: Secondary | ICD-10-CM

## 2022-07-06 NOTE — Telephone Encounter (Signed)
-----   Message from Rockey Situ sent at 07/06/2022  2:47 PM EDT ----- Regarding: MRI Contact: [phone] He had to stop his MRI because he started to have a seizure. He was told to contact the doctor that had ordered the scan to see what he should do next.

## 2022-07-06 NOTE — Telephone Encounter (Signed)
Left a message for the patient to call back.  

## 2022-07-06 NOTE — Telephone Encounter (Signed)
He sees Dr. [name] for his seizures. He should follow up with him (let him know he had a seizure).   When okay with Dr. [name], we can reorder MRI scans.   If he cannot have MRIs now, we can get updated xrays of his neck and back and consider PT.   Let me know if he wants to do that.

## 2022-07-12 NOTE — Telephone Encounter (Signed)
Let patient know that I will discuss with Dr. [name] and let him know when I hear back.

## 2022-07-12 NOTE — Telephone Encounter (Signed)
I spoke with the patient and notified him of your response. He stated that Dr. [name] is aware of his seizures and he is on medication for this, I asked him if he spoke with Dr. [name] about this recent episode and he stated "yes".  He would like to reschedule his MRIs and also wanted to see if you could send in Valium for him to take prior. Pharmacy is [facility] in [place].

## 2022-07-18 NOTE — Telephone Encounter (Signed)
Patient said that he saw Dr.Potter today and he said he should be fine to take valium and to have the MRI rescheduled. He would like to get in for his Mri asap. [phone]

## 2022-07-18 NOTE — Telephone Encounter (Signed)
Can we call and get his office note?

## 2022-07-19 NOTE — Telephone Encounter (Signed)
Note is not in his chart yet, will be checking through out the day.

## 2022-07-21 NOTE — Telephone Encounter (Signed)
FYICharline Howell called [facility] and spoke to Gilman. Patient did have an EKG, but there is no documentation that Dr.Potter cleared the patient have his MRI or to take valium. She will send a message back to Dr.Potter to verify.

## 2022-07-22 NOTE — Telephone Encounter (Signed)
Please call and let patient know we will get the MRIs rescheduled as soon as we can. I am waiting to hear back from Dr. [name] to make sure all is good from his standpoint.

## 2022-07-22 NOTE — Telephone Encounter (Signed)
Called and  spoke with patient advised we will rescheduled as soon as we can and we are waiting to hear from Dr. Daisy Blossom office. Patient stated that he will contact Dr Daisy Blossom office to get things moving along, he appreciated the call from our office.

## 2022-07-26 MED ORDER — DIAZEPAM 5 MG PO TABS
ORAL_TABLET | ORAL | 0 refills | Status: DC
Start: 2022-07-26 — End: 2022-08-31

## 2022-07-26 NOTE — Telephone Encounter (Signed)
Patient is calling, he just spoke to [employer] office. There's an encounter with yesterday's date where Dr.Potter says ok to proceed with MRI.

## 2022-07-26 NOTE — Telephone Encounter (Signed)
Phone call under care everywhere- okay to proceed with MRIs and give valium per neurology [name]).   Okay to reschedule cervical and lumbar MRI scans. I will send in valium for him to take prior to scans. Remind him he will need a driver.

## 2022-07-26 NOTE — Addendum Note (Signed)
Addended byDrake Leach on: 07/26/2022 02:35 PM   Modules accepted: Orders

## 2022-07-27 NOTE — Telephone Encounter (Signed)
Please let him know we will get his MRIs rescheduled (someone will call him) and we will send in valium to take prior. He will need a driver.

## 2022-08-01 ENCOUNTER — Other Ambulatory Visit: Payer: Self-pay | Admitting: Internal Medicine

## 2022-08-01 DIAGNOSIS — J449 Chronic obstructive pulmonary disease, unspecified: Secondary | ICD-10-CM

## 2022-08-01 NOTE — Telephone Encounter (Signed)
08/08/2022 MRI appt [facility] [place]

## 2022-08-01 NOTE — Telephone Encounter (Signed)
Patient aware.

## 2022-08-01 NOTE — Telephone Encounter (Signed)
Medication was sent in on 07/26/22.

## 2022-08-02 NOTE — Telephone Encounter (Signed)
Requested Prescriptions  Pending Prescriptions Disp Refills   albuterol (VENTOLIN HFA) 108 (90 Base) MCG/ACT inhaler [Pharmacy Med Name: VENTOLIN HFA 90 MCG INHALER] 18 each 0    Sig: INHALE 2 PUFFS INTO LUNGS EVERY 6 HOURS AS NEEDED FOR WHEEZING     Pulmonology:  Beta Agonists 2 Passed - 08/01/2022 10:08 AM      Passed - Last BP in normal range    BP Readings from Last 1 Encounters:  06/29/22 121/85         Passed - Last Heart Rate in normal range    Pulse Readings from Last 1 Encounters:  06/29/22 90         Passed - Valid encounter within last 12 months    Recent Outpatient Visits           1 month ago Hypertension goal BP (blood pressure) < 140/90   [facility] Margarita Mail, DO   8 months ago Hypertension goal BP (blood pressure) < 140/90   [facility] Margarita Mail, DO   9 months ago COVID-19   [facility] Margarita Mail, DO   1 year ago Trauma   [facility] Margarita Mail, DO   1 year ago Hypertension goal BP (blood pressure) < 140/90   [facility] Margarita Mail, DO       Future Appointments             In 4 months Margarita Mail, DO [facility], [facility]

## 2022-08-08 ENCOUNTER — Ambulatory Visit
Admission: RE | Admit: 2022-08-08 | Discharge: 2022-08-08 | Disposition: A | Discharge status: Home / Self Care | Payer: Medicaid Other | Source: Ambulatory Visit | Attending: Orthopedic Surgery | Admitting: Orthopedic Surgery

## 2022-08-08 DIAGNOSIS — M47812 Spondylosis without myelopathy or radiculopathy, cervical region: Secondary | ICD-10-CM | POA: Insufficient documentation

## 2022-08-08 DIAGNOSIS — M5416 Radiculopathy, lumbar region: Secondary | ICD-10-CM | POA: Diagnosis present

## 2022-08-08 DIAGNOSIS — M5442 Lumbago with sciatica, left side: Secondary | ICD-10-CM | POA: Insufficient documentation

## 2022-08-08 DIAGNOSIS — M542 Cervicalgia: Secondary | ICD-10-CM | POA: Insufficient documentation

## 2022-08-08 DIAGNOSIS — Z981 Arthrodesis status: Secondary | ICD-10-CM | POA: Insufficient documentation

## 2022-08-08 DIAGNOSIS — M5412 Radiculopathy, cervical region: Secondary | ICD-10-CM | POA: Insufficient documentation

## 2022-08-08 DIAGNOSIS — M5441 Lumbago with sciatica, right side: Secondary | ICD-10-CM | POA: Diagnosis present

## 2022-08-30 NOTE — Progress Notes (Unsigned)
Referring Physician:  No referring provider defined for this encounter.  Primary Physician:  Margarita Mail, DO  History of Present Illness: 08/31/2022 Scott Howell has a history of seizures, HTN, hyperlipidemia, COPD, bipolar, FM, history of ETOH abuse, and GERD.   History of ACDF C3-C5 for myelopathy on 01/13/21 with Dr. Myer Haff. History of dysfunction in right hand due to history of multiple fractures.   Last seen by me on 06/22/22 for neck and back pain. MRI of cervical and lumbar MRI ordered and he is here to review them.   Of note, he had anterior subcutaneous transposition of the ulnar nerve of his right elbow by Dr. Joice Lofts on 11/17/21. Per his note on 08/22/22, patient continued with pain, numbness, and tingling after the surgery. EMG showed electrodiagnostic evidence of a chronic, mild right ulnar mononeuropathy. Dr. Joice Lofts wanted him to follow up with with Dr. Katrinka Blazing.   He continues with intermittent neck pain that radiates to left scapular region. No arm pain. He has numbness, tingling, weakness in right hand as above.  Neck pain can be sharp and stabbing.   He also has constant LBP with intermittent left posterior leg pain to his knee. Pain is worse with walking. He has numbness, tingling, and weakness in his legs.   His balance is much worse. No new dexterity issues with left hand.   Bowel/Bladder Dysfunction: none  He smokes 1 and 1/2 ppd x 30 years.   Conservative measures:  Physical therapy: no recent Multimodal medical therapy including regular antiinflammatories: celebrex, robaxin, oxycodone  Injections: No recent epidural steroid injections  Past Surgery:  History of ACDF C3-C5 for myelopathy on 01/13/21 with Dr. Myer Haff History of 5 previous cervical fusion surgeries  Scott Howell has no symptoms of cervical myelopathy. Chronic issues with right hand. No new balance issues.   The symptoms are causing a significant impact on the patient's life.    Review of Systems:  A 10 point review of systems is negative, except for the pertinent positives and negatives detailed in the HPI.  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   Schizophrenia (HCC)    Seizures (HCC) 11/2020   Stroke (HCC)    no deficits   Tobacco use     Past Surgical History: Past Surgical History:  Procedure Laterality Date   ANTERIOR CERVICAL DECOMP/DISCECTOMY FUSION N/A 01/13/2021   Procedure: C3-5 ANTERIOR CERVICAL DECOMPRESSION/DISCECTOMY FUSION;  Surgeon: Venetia Night, MD;  Location: [facility];  Service: Neurosurgery;  Laterality: N/A;   CERVICAL SPINE SURGERY     4   CLAVICLE SURGERY     COLONOSCOPY WITH PROPOFOL N/A 06/29/2022   Procedure: COLONOSCOPY WITH PROPOFOL;  Surgeon: Wyline Mood, MD;  Location: [facility];  Service: Gastroenterology;  Laterality: N/A;  USES MED TRANS; WILL NEED TIME ON FRIDAY, 06/24/2022  HARDWARE REMOVAL Left 04/20/2015   Procedure: HARDWARE REMOVAL left leg;  Surgeon: Deeann Saint, MD;  Location: [facility];  Service: Orthopedics;  Laterality: Left;   KNEE SURGERY Left    NECK SURGERY     x6   SHOULDER SURGERY Left    TOTAL KNEE ARTHROPLASTY Left 04/12/2021   Procedure: TOTAL KNEE ARTHROPLASTY;  Surgeon: Lyndle Herrlich, MD;  Location: [facility];  Service: Orthopedics;  Laterality: Left;   ULNAR NERVE TRANSPOSITION Right 11/17/2021   Procedure: SUBCUTANEOUS TRANSPOSITION OF ULNAR NERVE, RIGHT  ELBOW;  Surgeon: Christena Flake, MD;  Location: [facility];  Service: Orthopedics;  Laterality: Right;    Allergies: Allergies as of 08/31/2022 - Review Complete 08/31/2022  Allergen Reaction Noted   Acetaminophen Other (See Comments) 07/06/2014    Medications: Outpatient Encounter Medications as of 08/31/2022  Medication Sig   albuterol (VENTOLIN HFA) 108 (90 Base) MCG/ACT inhaler INHALE 2 PUFFS INTO LUNGS EVERY 6 HOURS AS NEEDED FOR WHEEZING   atorvastatin (LIPITOR) 40 MG tablet Take 1 tablet (40 mg total) by mouth daily.   budesonide-formoterol (SYMBICORT) 160-4.5 MCG/ACT inhaler Inhale 2 puffs into the lungs 2 (two) times daily.   divalproex (DEPAKOTE) 250 MG DR tablet Take 1 tablet (250 mg total) by mouth 3 (three) times daily.   famotidine (PEPCID) 20 MG tablet Take 1 tablet (20 mg total) by mouth 2 (two) times daily.   gabapentin (NEURONTIN) 100 MG capsule 100 mg 2 (two) times daily. Increase to 200mg  BID   ipratropium-albuterol (DUONEB) 0.5-2.5 (3) MG/3ML SOLN Take 3 mLs by nebulization 2 (two) times daily.   lisinopril (ZESTRIL) 10 MG tablet Take 1 tablet (10 mg total) by mouth daily.   QUEtiapine (SEROQUEL) 100 MG tablet Take 1 tablet (100 mg total) by mouth at bedtime.   [DISCONTINUED] celecoxib (CELEBREX) 100 MG capsule Take 100 mg by mouth 2 (two) times daily.   [DISCONTINUED] diazepam (VALIUM) 5 MG tablet 1 po 30 minutes prior to MRI scan. May repeat x 1 right before MRI scan if needed. You will need a driver.   No facility-administered encounter medications on file as of 08/31/2022.    Social History: Social History   Tobacco Use   Smoking status: Every Day    Current packs/day: 1.50    Average packs/day: 1.5 packs/day for 30.0 years (45.0 ttl pk-yrs)    Types: Cigarettes   Smokeless tobacco: Never  Vaping Use   Vaping status: Never Used  Substance Use Topics   Alcohol use: No    Comment: Sober for 10 yrs.   Drug use: Yes    Types: Marijuana    Family Medical  History: Family History  Problem Relation Age of Onset   Diabetes Mother    Hyperlipidemia Mother    Hypertension Mother    Diabetes Maternal Aunt    Cancer Maternal Aunt    Hyperlipidemia Maternal Aunt    Hypertension Maternal Aunt    Diabetes Maternal Uncle    Cancer Maternal Uncle    Hyperlipidemia Maternal Uncle    Hypertension Maternal Uncle     Physical Examination: Vitals:   08/31/22 1441  BP: 122/84  Pulse: (!) 102      Awake, alert, oriented to person, place, and time.  Speech is clear and fluent. Fund of knowledge is appropriate.   Cranial Nerves: Pupils equal round and reactive to light.  Facial tone is symmetric.    He has lower posterior cervical tenderness that is worse on left. He has tenderness  left medial scapular border.  Mild diffuse posterior lumbar tenderness.    No abnormal lesions on exposed skin.   Strength: Side Biceps Triceps Deltoid Interossei Grip Wrist Ext. Wrist Flex.  R 5 5 5 5  --- 5 5  L 5 5 5 5 5 5 5    Side Iliopsoas Quads Hamstring PF DF EHL  R 5 5 5 5 5 5   L 5 5 5 5 5 5    Reflexes are 2+ and symmetric at the biceps, triceps, brachioradialis, patella and achilles.   Hoffman's is positive in both hands.    Bilateral upper and lower extremity sensation is intact to light touch, but diminished in medial calf bilaterally. Also diminished ulnar aspect right arm from elbow into hand.   Limited use of right small/ring finger. No gross weakness in grip strength, but he can only use his index and middle finger.   He has a slow limping gait.   Medical Decision Making  Imaging: Cervical MRI dated 08/08/22:  INDINGS: Alignment: No significant listhesis.   Vertebrae: Status post interval extension of fusion to C3-C5, in addition to prior fusion C5-C7. Redemonstrated decompression and posterior fusion C5-C7. Susceptibility artifact from the hardware limits evaluation of the levels. Within this limitation, no evidence of acute fracture,  discitis, or suspicious osseous lesion. Congenitally short pedicles, which narrow the AP diameter of the spinal canal.   Cord: Unchanged T2 hyperintense signal and volume loss at the right aspect of the spinal cord C5-C6, consistent with myelomalacia. Additional T2 hyperintense foci in the bilateral anterior horn cells (series 8, image 31) at the level of C6-C7 appear more prominent than on the prior exam but were likely present, also favored to represent mild malacia. Cord flattening at C4-C5 appears similar to the prior exam.   Posterior Fossa, vertebral arteries, paraspinal tissues: Negative.   Disc levels:   C2-C3: Mild disc bulge. Facet and uncovertebral hypertrophy. No spinal canal stenosis. Mild left neural foraminal narrowing, unchanged.   C3-C4: Status post interval ACDF. Facet and uncovertebral hypertrophy. No residual spinal canal stenosis. Mild-to-moderate left and mild right neural foraminal narrowing.   C4-C5: Status post interval ACDF. Redemonstrated facet and uncovertebral hypertrophy. Ligamentum flavum hypertrophy. Moderate to severe spinal canal stenosis and severe neural foraminal narrowing, which appear unchanged from the prior exam.   C5-C6: Status post fusion and decompression with redemonstrated osseous protrusion in the right subarticular space. Facet and uncovertebral hypertrophy. No spinal canal stenosis. Mild left neural foraminal narrowing, similar to prior.   C6-C7: Status post fusion and decompression. Facet and uncovertebral hypertrophy. No spinal canal stenosis. Moderate left neural foraminal narrowing, similar to prior.   C7-T1: Disc bulge with right foraminal protrusion. Facet and uncovertebral hypertrophy. Mild spinal canal stenosis, unchanged. Severe right and moderate to severe left neural foraminal narrowing is likely similar to prior.   IMPRESSION: 1. Status post interval extension of fusion to C3-C5, with persistent moderate to  severe spinal canal stenosis and severe bilateral neural foraminal narrowing at C4-C5. 2. C3-C4 mild-to-moderate left and mild right neural foraminal narrowing, without residual spinal canal stenosis. 3. C5-C6 mild left neural foraminal narrowing, unchanged. 4. C6-C7 moderate left neural foraminal narrowing, unchanged. 5. C7-T1 mild spinal canal stenosis with severe right and moderate to severe left neural foraminal narrowing, unchanged. 6. T2 hyperintense foci in the bilateral anterior horn cells at the level of C6-C7 appear more prominent than on the prior exam but were likely present. This likely represents myelomalacia.     Electronically Signed  By: Wiliam Ke M.D.   On: 08/14/2022 21:29    Lumbar MRI dated 08/08/22:  FINDINGS: Segmentation:  5 lumbar type vertebral bodies.   Alignment:  Levocurvature.  Trace retrolisthesis of L2 on L3.   Vertebrae: No acute fracture, evidence of discitis, or suspicious osseous lesion.   Conus medullaris and cauda equina: Conus extends to the L1 level. Possible thickening and clumping of the nerve roots inferior to L4-L5 (series 12, image 29).   Paraspinal and other soft tissues: Asymmetric atrophy of the left inferior paraspinous muscles.   Disc levels:   T12-L1: Minimal disc bulge. Mild facet arthropathy. No spinal canal stenosis or neural foraminal narrowing.   L1-L2: No significant disc bulge. Mild facet arthropathy. No spinal canal stenosis or neural foraminal narrowing.   L2-L3: Mild disc bulge. Mild facet arthropathy. Ligamentum flavum hypertrophy. Narrowing of the lateral recesses. Mild to moderate spinal canal stenosis. Mild left neural foraminal narrowing.   L3-L4: Mild disc bulge. Mild facet arthropathy. Ligamentum flavum hypertrophy. Mild-to-moderate spinal canal stenosis. Mild-to-moderate bilateral neural foraminal narrowing.   L4-L5: Mild disc bulge. Moderate facet arthropathy. Ligamentum flavum hypertrophy.  Effacement of the lateral recesses. Moderate spinal canal stenosis. Mild-to-moderate bilateral neural foraminal narrowing.   L5-S1: Left paracentral disc protrusion with annular fissure. Narrowing of the left lateral recess. Moderate left and mild right facet arthropathy. Mild left neural foraminal narrowing.   IMPRESSION: 1. L4-L5 moderate spinal canal stenosis and mild-to-moderate bilateral neural foraminal narrowing. Effacement of the lateral recesses at this level likely compresses the descending L5 nerve roots. 2. L3-L4 mild-to-moderate spinal canal stenosis and mild-to-moderate bilateral neural foraminal narrowing. 3. L2-L3 mild-to-moderate spinal canal stenosis and mild left neural foraminal narrowing. Narrowing of the lateral recesses at this level could affect the descending L3 nerve roots. 4. L5-S1 mild left neural foraminal narrowing. Narrowing of the left lateral recess at this level could affect the descending left S1 nerve roots. 5. Possible thickening and clumping of the nerve roots inferior to L4-L5, which can be seen in the setting of arachnoiditis.     Electronically Signed   By: Wiliam Ke M.D.   On: 08/14/2022 21:36  I have personally reviewed the images and agree with the above interpretation.  Plan: Scott Howell is a pleasant 52 y.o. male has a history of ACDF C3-C5 for myelopathy on 01/13/21 with Dr. Myer Haff. History of 4-5 previous cervical surgeries as well.   He continues with intermittent neck pain that radiates to left scapular region. No arm pain. He has numbness, tingling, weakness in right hand as above.  Neck pain can be sharp and stabbing.   Cervical MRI shows cervical stenosis at C4-C5. Severe right and moderate left foraminal stenosis C7-T1. Myelomalacia at C5-C6 and C6-C7 that is unchanged, cord flattening C4-C5 that are unchanged.   He also has constant LBP with intermittent left posterior leg pain to his knee. Pain is worse with walking.  He has numbness, tingling, and weakness in his legs.   He has lumbar spondylosis with mild/moderate central stenosis L2-L3 and L3-L4. Moderate central stenosis L4-L5 and multilevel foraminal stenosis.   His balance is much worse. No new dexterity issues with left hand.   Treatment options discussed with patient and following plan made:   - MRI of thoracic spine to further evaluate balance issues. Will review cervical MRI with Dr. Myer Haff as well.  - Referral to PMR at [facility] to consider cervical and lumbar injections. He likely has myofascial component to his neck pain.  -  Follow up with me in clinic after his MRI is done to review the results.  - May consider PT, but will wait in thoracic MRI results.   Of note, he had anterior subcutaneous transposition of the ulnar nerve of his right elbow by Dr. Joice Lofts on 11/17/21. Per his note on 08/22/22, patient continued with pain, numbness, and tingling after the surgery. EMG showed electrodiagnostic evidence of a chronic, mild right ulnar mononeuropathy. Dr. Joice Lofts wanted him to follow up with with Dr. Katrinka Blazing. Appointment was scheduled.   I spent a total of 30 minutes in face-to-face and non-face-to-face activities related to this patient's care today including review of outside records, review of imaging, review of symptoms, physical exam, discussion of differential diagnosis, discussion of treatment options, and documentation.   Scott Leach PA-C Dept. of Neurosurgery

## 2022-08-31 ENCOUNTER — Ambulatory Visit (INDEPENDENT_AMBULATORY_CARE_PROVIDER_SITE_OTHER): Payer: MEDICAID | Admitting: Orthopedic Surgery

## 2022-08-31 ENCOUNTER — Encounter: Payer: Self-pay | Admitting: Orthopedic Surgery

## 2022-08-31 VITALS — BP 122/84 | HR 102 | Wt 227.0 lb

## 2022-08-31 DIAGNOSIS — G9589 Other specified diseases of spinal cord: Secondary | ICD-10-CM | POA: Diagnosis not present

## 2022-08-31 DIAGNOSIS — Z981 Arthrodesis status: Secondary | ICD-10-CM

## 2022-08-31 DIAGNOSIS — M4802 Spinal stenosis, cervical region: Secondary | ICD-10-CM

## 2022-08-31 DIAGNOSIS — R2689 Other abnormalities of gait and mobility: Secondary | ICD-10-CM

## 2022-08-31 DIAGNOSIS — M5416 Radiculopathy, lumbar region: Secondary | ICD-10-CM

## 2022-08-31 DIAGNOSIS — M48061 Spinal stenosis, lumbar region without neurogenic claudication: Secondary | ICD-10-CM

## 2022-08-31 DIAGNOSIS — M47816 Spondylosis without myelopathy or radiculopathy, lumbar region: Secondary | ICD-10-CM

## 2022-08-31 DIAGNOSIS — M47812 Spondylosis without myelopathy or radiculopathy, cervical region: Secondary | ICD-10-CM

## 2022-08-31 DIAGNOSIS — M4726 Other spondylosis with radiculopathy, lumbar region: Secondary | ICD-10-CM | POA: Diagnosis not present

## 2022-08-31 NOTE — Patient Instructions (Signed)
It was so nice to see you today. Thank you so much for coming in.    You have some wear and tear in your neck. I don't think your balance issues are from your neck. I want to get an MRI of your mid back. We will get this approved through your insurance and [facility] will call you to schedule the appointment.   You have wear and tear in your back as well.   I want you to see physical medicine and rehab at the [facility] to discuss possible injections in your neck and back. Dr. Yves Dill, Dr. Mariah Milling, and their PA Alphonzo Lemmings are great and will take good care of you. They should call you to schedule an appointment or you can call them at [phone].   We will schedule you to see Dr. Katrinka Blazing for your right hand.   It takes 5-7 days for me to get the MRI results back after it is done. Once I have them, we will call to schedule a follow up with me.   Please do not hesitate to call if you have any questions or concerns. You can also message me in MyChart.   Drake Leach PA-C [phone]

## 2022-09-05 NOTE — H&P (View-Only) (Signed)
 Referring Physician:  Christena Flake, MD [phone] [facility] [address] [facility] [place],  [place] [address]  Primary Physician:  Margarita Mail, DO  History of Present Illness: 09/05/2022 [name] is here today with a chief complaint of right-sided ulnar neuropathy.  He has a history of an anterior cervical discectomy and fusion.  Continued to have right arm and hand pain and underwent a right ulnar nerve decompression due to ulnar neuropathy.  He unfortunately continued to have worsening pain and weakness and presents today after extensive occupational and physical therapy.  He has tried low-dose neuropathic pain medication including 200 mg of gabapentin however this not giving him any relief.  He feels like he has been progressively worsening.  Has severe pain numbness and tingling in his ulnar distribution as well as muscle wasting.  Feels like he continues to worsen.  Has tried conservative therapy including padding, bracing, medical management, continues to decline.  Conservative measures:  Physical/occupational therapy: Yes 6 months but had to stop it make him vomit because of the pain. Hand Therapy: Yes Injections: No Gabapentin: Yes, Lyrica: No, Cymbalta: No  Past Surgery: Yes, prior ulnar nerve decompression  ACDF C3-C5 for myelopathy on 01/13/21 with Dr. Myer Howell    Review of Systems:  A 10 point review of systems is negative, except for the pertinent positives and negatives detailed in the HPI.  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   Schizophrenia (HCC)    Seizures (HCC) 11/2020   Stroke (HCC)    no deficits   Tobacco use     Past Surgical History: Past Surgical History:  Procedure Laterality Date   ANTERIOR CERVICAL DECOMP/DISCECTOMY FUSION N/A 01/13/2021   Procedure: C3-5 ANTERIOR CERVICAL DECOMPRESSION/DISCECTOMY FUSION;  Surgeon: Venetia Night, MD;  Location: [facility] ORS;  Service: Neurosurgery;  Laterality: N/A;   CERVICAL SPINE SURGERY     4   CLAVICLE SURGERY     COLONOSCOPY WITH PROPOFOL N/A 06/29/2022   Procedure: COLONOSCOPY WITH PROPOFOL;  Surgeon: Wyline Mood, MD;  Location: [facility];  Service: Gastroenterology;  Laterality: N/A;  USES MED TRANS; WILL NEED TIME ON FRIDAY, 06/24/2022   HARDWARE REMOVAL Left 04/20/2015   Procedure: HARDWARE REMOVAL left leg;  Surgeon: Deeann Saint, MD;  Location: [facility];  Service: Orthopedics;  Laterality: Left;   KNEE SURGERY Left    NECK SURGERY     x6   SHOULDER SURGERY Left    TOTAL KNEE ARTHROPLASTY Left 04/12/2021   Procedure: TOTAL KNEE ARTHROPLASTY;  Surgeon: Lyndle Herrlich, MD;  Location: [facility];  Service: Orthopedics;  Laterality: Left;   ULNAR NERVE TRANSPOSITION Right 11/17/2021   Procedure: SUBCUTANEOUS TRANSPOSITION OF ULNAR NERVE, RIGHT ELBOW;  Surgeon: Christena Flake, MD;  Location: [facility];  Service: Orthopedics;  Laterality: Right;  Allergies: Allergies as of 09/07/2022 - Review Complete 08/31/2022  Allergen Reaction Noted   Acetaminophen Other (See Comments) 07/06/2014    Medications:  Current Outpatient Medications:    albuterol (VENTOLIN HFA) 108 (90 Base) MCG/ACT inhaler, INHALE 2 PUFFS INTO LUNGS EVERY 6 HOURS AS NEEDED FOR WHEEZING, Disp: 18 each, Rfl: 0   atorvastatin (LIPITOR) 40 MG tablet, Take 1 tablet (40 mg total) by mouth daily., Disp: 90 tablet, Rfl: 1   budesonide-formoterol (SYMBICORT) 160-4.5 MCG/ACT inhaler,  Inhale 2 puffs into the lungs 2 (two) times daily., Disp: 1 each, Rfl: 3   divalproex (DEPAKOTE) 250 MG DR tablet, Take 1 tablet (250 mg total) by mouth 3 (three) times daily., Disp: 90 tablet, Rfl: 1   famotidine (PEPCID) 20 MG tablet, Take 1 tablet (20 mg total) by mouth 2 (two) times daily., Disp: 180 tablet, Rfl: 1   gabapentin (NEURONTIN) 100 MG capsule, 100 mg 2 (two) times daily. Increase to 200mg  BID, Disp: , Rfl:    ipratropium-albuterol (DUONEB) 0.5-2.5 (3) MG/3ML SOLN, Take 3 mLs by nebulization 2 (two) times daily., Disp: 360 mL, Rfl: 1   lisinopril (ZESTRIL) 10 MG tablet, Take 1 tablet (10 mg total) by mouth daily., Disp: 90 tablet, Rfl: 1   QUEtiapine (SEROQUEL) 100 MG tablet, Take 1 tablet (100 mg total) by mouth at bedtime., Disp: 90 tablet, Rfl: 0  Social History: Social History   Tobacco Use   Smoking status: Every Day    Current packs/day: 1.50    Average packs/day: 1.5 packs/day for 30.0 years (45.0 ttl pk-yrs)    Types: Cigarettes   Smokeless tobacco: Never  Vaping Use   Vaping status: Never Used  Substance Use Topics   Alcohol use: No    Comment: Sober for 10 yrs.   Drug use: Yes    Types: Marijuana    Family Medical History: Family History  Problem Relation Age of Onset   Diabetes Mother    Hyperlipidemia Mother    Hypertension Mother    Diabetes Maternal Aunt    Cancer Maternal Aunt    Hyperlipidemia Maternal Aunt    Hypertension Maternal Aunt    Diabetes Maternal Uncle    Cancer Maternal Uncle    Hyperlipidemia Maternal Uncle    Hypertension Maternal Uncle     Physical Examination: There were no vitals filed for this visit.  General: Patient is in no apparent distress. Attention to examination is appropriate.  Neck:   Supple.  Full range of motion.  Respiratory: Patient is breathing without any difficulty.   NEUROLOGICAL:     Awake, alert, oriented to person, place, and time.  Speech is clear and fluent.   Cranial Nerves: Pupils equal  round and reactive to light.  Facial tone is symmetric. Shoulder shrug is symmetric. Tongue protrusion is midline.  There is no pronator drift.  Motor Exam:  On motor exam nation today, he is full strength proximally.  He has notable muscle loss in his medial compartment of his forearm as well as his ulnar intrinsics.  Loss of first dorsal interosseous muscle as well as his abductor digiti minimi.  He is approximately 1 out of 5 in his ulnar nerve distribution distally.  His median distribution is approximately 4+ out of 5.  Reflexes are 2+ and symmetric at the biceps, triceps, brachioradialis, patella and achilles.    In the right upper extremity he has lack of to point discrimination in his ulnar nerve distribution measured out to 20 mm.  In  the median distribution he is approximately 12 mm.  Pinch Grip 5lbs left, 25lbs right   Medical Decision Making  Imaging: Narrative & Impression  CLINICAL DATA:  Chronic neck pain, radiating from left shoulder to hand   EXAM: MRI CERVICAL SPINE WITHOUT CONTRAST   TECHNIQUE: Multiplanar, multisequence MR imaging of the cervical spine was performed. No intravenous contrast was administered.   COMPARISON:  11/28/2020 MRI cervical spine   FINDINGS: Alignment: No significant listhesis.   Vertebrae: Status post interval extension of fusion to C3-C5, in addition to prior fusion C5-C7. Redemonstrated decompression and posterior fusion C5-C7. Susceptibility artifact from the hardware limits evaluation of the levels. Within this limitation, no evidence of acute fracture, discitis, or suspicious osseous lesion. Congenitally short pedicles, which narrow the AP diameter of the spinal canal.   Cord: Unchanged T2 hyperintense signal and volume loss at the right aspect of the spinal cord C5-C6, consistent with myelomalacia. Additional T2 hyperintense foci in the bilateral anterior horn cells (series 8, image 31) at the level of C6-C7 appear more  prominent than on the prior exam but were likely present, also favored to represent mild malacia. Cord flattening at C4-C5 appears similar to the prior exam.   Posterior Fossa, vertebral arteries, paraspinal tissues: Negative.   Disc levels:   C2-C3: Mild disc bulge. Facet and uncovertebral hypertrophy. No spinal canal stenosis. Mild left neural foraminal narrowing, unchanged.   C3-C4: Status post interval ACDF. Facet and uncovertebral hypertrophy. No residual spinal canal stenosis. Mild-to-moderate left and mild right neural foraminal narrowing.   C4-C5: Status post interval ACDF. Redemonstrated facet and uncovertebral hypertrophy. Ligamentum flavum hypertrophy. Moderate to severe spinal canal stenosis and severe neural foraminal narrowing, which appear unchanged from the prior exam.   C5-C6: Status post fusion and decompression with redemonstrated osseous protrusion in the right subarticular space. Facet and uncovertebral hypertrophy. No spinal canal stenosis. Mild left neural foraminal narrowing, similar to prior.   C6-C7: Status post fusion and decompression. Facet and uncovertebral hypertrophy. No spinal canal stenosis. Moderate left neural foraminal narrowing, similar to prior.   C7-T1: Disc bulge with right foraminal protrusion. Facet and uncovertebral hypertrophy. Mild spinal canal stenosis, unchanged. Severe right and moderate to severe left neural foraminal narrowing is likely similar to prior.   IMPRESSION: 1. Status post interval extension of fusion to C3-C5, with persistent moderate to severe spinal canal stenosis and severe bilateral neural foraminal narrowing at C4-C5. 2. C3-C4 mild-to-moderate left and mild right neural foraminal narrowing, without residual spinal canal stenosis. 3. C5-C6 mild left neural foraminal narrowing, unchanged. 4. C6-C7 moderate left neural foraminal narrowing, unchanged. 5. C7-T1 mild spinal canal stenosis with severe right and  moderate to severe left neural foraminal narrowing, unchanged. 6. T2 hyperintense foci in the bilateral anterior horn cells at the level of C6-C7 appear more prominent than on the prior exam but were likely present. This likely represents myelomalacia.     Electronically Signed   By: Wiliam Ke M.D.   On: 08/14/2022 21:29      Electrodiagnostics: Alphonzo Lemmings, MD - 07/18/2022 8:30 AM EDT Formatting of this note is different from the original. Images from the original note were not included.  Test Date: 07/18/2022  Patient: Mervyn Pflaum DOB: [birth year] Physician: Dr. Theora Master Chart#: [id number] Sex: Male Ref Phys: Dr. Theora Master  Patient History: Patient is a 52 year-old male who presents with right hand weakness, numbness. Cannot stretch out fingers in the right hand. Had surgery on right elbow about 8  months ago and noticed symptoms after. Has neck pain. Past medical history is significant for neck surgery x5.  Exam: Decreased sensation is noted in the right hand, primarily in the 4th and 5th digits.  EMG & NCV Findings: Evaluation of the Right ulnar motor nerve showed prolonged distal onset latency (3.8 ms) and reduced amplitude (4.5 mV). All remaining nerves (as indicated in the following tables) were within normal limits.  EMG  Side Muscle Nerve Root Ins Act Fibs Psw Amp Dur Poly Recrt Int Dennie Bible Comment Right Abd Poll Brev Median C8-T1 Nml Nml Nml Nml Nml 0 Nml Nml Right 1stDorInt Ulnar C8-T1 Nml Nml Nml Incr >41ms 2+ moderate Reduced Nml Right PronatorTeres Median C6-7 Nml Nml Nml Nml Nml 0 Nml Nml Right FlexCarpiUln Ulnar C8,T1 Nml Nml Nml Incr >51ms 2+ moderate Reduced Nml Right Biceps Musculocut C5-6 Nml Nml Nml Nml Nml 0 Nml Nml Right Triceps Radial C6-7-8 Nml Nml Nml Nml Nml 0 Nml Nml  Impression: Abnormal study. There is electrodiagnostic evidence of a chronic, mild right ulnar mononeuropathy.  Thank you for the referral of this patient. It was  our privilege to participate in care of your patient. Feel free to contact us with any further questions.  _____________________________ Theora Master, MD  Nerve Conduction Studies Anti Sensory Summary Table  Site NR Peak (ms) Norm Peak (ms) P-T Amp (V) Norm P-T Amp Site1 Site2 Dist (cm) Vel (m/s) Norm Vel (m/s) Right Median Anti Sensory (2nd Digit) Wrist 3.2 <3.6 23.6 >10 Wrist 2nd Digit 13.0 41 >39 Right Radial Anti Sensory (Base 1st Digit) Wrist 2.5 <2.5 19.5 Wrist Base 1st Digit 0.0 Right Ulnar Anti Sensory (5th Digit) Wrist 2.9 <3.7 21.2 >15.0 Wrist 5th Digit 12.0 41 >38  Motor Summary Table  Site NR Onset (ms) Norm Onset (ms) O-P Amp (mV) Norm O-P Amp Site1 Site2 Dist (cm) Vel (m/s) Norm Vel (m/s) Right Median Motor (Abd Poll Brev) Wrist 3.4 <4.5 6.9 >3 Elbow Wrist 25.0 56 >48 Elbow 7.9 7.0 Right Ulnar Motor (Abd Dig Minimi) Wrist 3.8 <3.6 4.5 >5 B Elbow Wrist 24.0 56 >48 B Elbow 8.1 4.5 A Elbow B Elbow 10.0 59 >48 A Elbow 9.8 4.3  Comparison Summary Table  Site NR Peak (ms) Norm Peak (ms) P-T Amp (V) Site1 Site2 Delta-P (ms) Norm Delta (ms) Right Median/Ulnar Palm Comparison (Wrist - 8cm) Median Palm 2.0 <2.5 46.3 Median Palm Ulnar Palm 0.3 <0.3 Ulnar Palm 1.7 <2.5 19.8  Waveforms:   I have personally reviewed the images and electrodiagnostics and agree with the above interpretation.    Assessment and Plan: Cubital tunnel syndrome on right Weakness of right hand Nerve pain yes  Mr. Deguia is a pleasant 52 y.o. male with a severe cubital tunnel syndrome with severe weakness in the ulnar distribution as well as severe pain in the ulnar distribution.  He has had electrodiagnostic study prior which demonstrated an ulnar neuropathy, clinically given his severe wasting of his ADM and first dorsal interosseous musculature this would be clinically characterized as severe.  He has failed medication trials, has had 6+ months of hand and physical therapy.  Has tried ulnar  nerve padding.  He has had a previous decompression but continues to have worsening.  Given his severe and worsening symptoms we feel that he would benefit from an ulnar nerve decompression and transposition at the elbow, he would also been fit from a anterior interosseous nerve to ulnar nerve transfer given the severity of his weakness.  Given that pain  is a large portion of his symptoms and has not been controlled on medical management we feel that he would benefit from a ulnar nerve stimulator placement at the time of exploration and decompression.. The symptoms are causing a significant impact on the patient's life. I have utilized the care everywhere function in epic to review the outside records available from external health systems, and have personally reviewed relevant imaging and electrodiagnostic workup.    Thank you for involving me in the care of this patient.    Lovenia Kim MD/MSCR Neurosurgery - Peripheral Nerve Surgery

## 2022-09-05 NOTE — Progress Notes (Unsigned)
Referring Physician:  Christena Flake, MD [phone] [facility] [address] [facility] [place],  [place] [address]  Primary Physician:  Margarita Mail, DO  History of Present Illness: 09/05/2022 [name] is here today with a chief complaint of right-sided ulnar neuropathy.  He has a history of an anterior cervical discectomy and fusion.  Continued to have right arm and hand pain and underwent a right ulnar nerve decompression due to ulnar neuropathy.  He unfortunately continued to have worsening pain and weakness and presents today after extensive occupational and physical therapy.  He has tried low-dose neuropathic pain medication including 200 mg of gabapentin however this not giving him any relief.  He feels like he has been progressively worsening.  Has severe pain numbness and tingling in his ulnar distribution as well as muscle wasting.  Feels like he continues to worsen.  Has tried conservative therapy including padding, bracing, medical management, continues to decline.  Conservative measures:  Physical/occupational therapy: Yes 6 months but had to stop it make him vomit because of the pain. Hand Therapy: Yes Injections: No Gabapentin: Yes, Lyrica: No, Cymbalta: No  Past Surgery: Yes, prior ulnar nerve decompression  ACDF C3-C5 for myelopathy on 01/13/21 with Dr. Myer Haff    Review of Systems:  A 10 point review of systems is negative, except for the pertinent positives and negatives detailed in the HPI.  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   Schizophrenia (HCC)    Seizures (HCC) 11/2020   Stroke (HCC)    no deficits   Tobacco use     Past Surgical History: Past Surgical History:  Procedure Laterality Date   ANTERIOR CERVICAL DECOMP/DISCECTOMY FUSION N/A 01/13/2021   Procedure: C3-5 ANTERIOR CERVICAL DECOMPRESSION/DISCECTOMY FUSION;  Surgeon: Venetia Night, MD;  Location: [facility] ORS;  Service: Neurosurgery;  Laterality: N/A;   CERVICAL SPINE SURGERY     4   CLAVICLE SURGERY     COLONOSCOPY WITH PROPOFOL N/A 06/29/2022   Procedure: COLONOSCOPY WITH PROPOFOL;  Surgeon: Wyline Mood, MD;  Location: [facility];  Service: Gastroenterology;  Laterality: N/A;  USES MED TRANS; WILL NEED TIME ON FRIDAY, 06/24/2022   HARDWARE REMOVAL Left 04/20/2015   Procedure: HARDWARE REMOVAL left leg;  Surgeon: Deeann Saint, MD;  Location: [facility] OR[facility];  Service: Orthopedics;  Laterality: Left;   KNEE SURGERY Left    NECK SURGERY     x6   SHOULDER SURGERY Left    TOTAL KNEE ARTHROPLASTY Left 04/12/2021   Procedure: TOTAL KNEE ARTHROPLASTY;  Surgeon: Lyndle Herrlich, MD;  Location: [facility] OR[facility];  Service: Orthopedics;  Laterality: Left;   ULNAR NERVE TRANSPOSITION Right 11/17/2021   Procedure: SUBCUTANEOUS TRANSPOSITION OF ULNAR NERVE, RIGHT ELBOW;  Surgeon: Christena Flake, MD;  Location: [facility];  Service: Orthopedics;  Laterality: Right;  Allergies: Allergies as of 09/07/2022 - Review Complete 08/31/2022  Allergen Reaction Noted   Acetaminophen Other (See Comments) 07/06/2014    Medications:  Current Outpatient Medications:    albuterol (VENTOLIN HFA) 108 (90 Base) MCG/ACT inhaler, INHALE 2 PUFFS INTO LUNGS EVERY 6 HOURS AS NEEDED FOR WHEEZING, Disp: 18 each, Rfl: 0   atorvastatin (LIPITOR) 40 MG tablet, Take 1 tablet (40 mg total) by mouth daily., Disp: 90 tablet, Rfl: 1   budesonide-formoterol (SYMBICORT) 160-4.5 MCG/ACT inhaler,  Inhale 2 puffs into the lungs 2 (two) times daily., Disp: 1 each, Rfl: 3   divalproex (DEPAKOTE) 250 MG DR tablet, Take 1 tablet (250 mg total) by mouth 3 (three) times daily., Disp: 90 tablet, Rfl: 1   famotidine (PEPCID) 20 MG tablet, Take 1 tablet (20 mg total) by mouth 2 (two) times daily., Disp: 180 tablet, Rfl: 1   gabapentin (NEURONTIN) 100 MG capsule, 100 mg 2 (two) times daily. Increase to 200mg  BID, Disp: , Rfl:    ipratropium-albuterol (DUONEB) 0.5-2.5 (3) MG/3ML SOLN, Take 3 mLs by nebulization 2 (two) times daily., Disp: 360 mL, Rfl: 1   lisinopril (ZESTRIL) 10 MG tablet, Take 1 tablet (10 mg total) by mouth daily., Disp: 90 tablet, Rfl: 1   QUEtiapine (SEROQUEL) 100 MG tablet, Take 1 tablet (100 mg total) by mouth at bedtime., Disp: 90 tablet, Rfl: 0  Social History: Social History   Tobacco Use   Smoking status: Every Day    Current packs/day: 1.50    Average packs/day: 1.5 packs/day for 30.0 years (45.0 ttl pk-yrs)    Types: Cigarettes   Smokeless tobacco: Never  Vaping Use   Vaping status: Never Used  Substance Use Topics   Alcohol use: No    Comment: Sober for 10 yrs.   Drug use: Yes    Types: Marijuana    Family Medical History: Family History  Problem Relation Age of Onset   Diabetes Mother    Hyperlipidemia Mother    Hypertension Mother    Diabetes Maternal Aunt    Cancer Maternal Aunt    Hyperlipidemia Maternal Aunt    Hypertension Maternal Aunt    Diabetes Maternal Uncle    Cancer Maternal Uncle    Hyperlipidemia Maternal Uncle    Hypertension Maternal Uncle     Physical Examination: There were no vitals filed for this visit.  General: Patient is in no apparent distress. Attention to examination is appropriate.  Neck:   Supple.  Full range of motion.  Respiratory: Patient is breathing without any difficulty.   NEUROLOGICAL:     Awake, alert, oriented to person, place, and time.  Speech is clear and fluent.   Cranial Nerves: Pupils equal  round and reactive to light.  Facial tone is symmetric. Shoulder shrug is symmetric. Tongue protrusion is midline.  There is no pronator drift.  Motor Exam:  On motor exam nation today, he is full strength proximally.  He has notable muscle loss in his medial compartment of his forearm as well as his ulnar intrinsics.  Loss of first dorsal interosseous muscle as well as his abductor digiti minimi.  He is approximately 1 out of 5 in his ulnar nerve distribution distally.  His median distribution is approximately 4+ out of 5.  Reflexes are 2+ and symmetric at the biceps, triceps, brachioradialis, patella and achilles.    In the right upper extremity he has lack of to point discrimination in his ulnar nerve distribution measured out to 20 mm.  In  the median distribution he is approximately 12 mm.  Pinch Grip 5lbs left, 25lbs right   Medical Decision Making  Imaging: Narrative & Impression  CLINICAL DATA:  Chronic neck pain, radiating from left shoulder to hand   EXAM: MRI CERVICAL SPINE WITHOUT CONTRAST   TECHNIQUE: Multiplanar, multisequence MR imaging of the cervical spine was performed. No intravenous contrast was administered.   COMPARISON:  11/28/2020 MRI cervical spine   FINDINGS: Alignment: No significant listhesis.   Vertebrae: Status post interval extension of fusion to C3-C5, in addition to prior fusion C5-C7. Redemonstrated decompression and posterior fusion C5-C7. Susceptibility artifact from the hardware limits evaluation of the levels. Within this limitation, no evidence of acute fracture, discitis, or suspicious osseous lesion. Congenitally short pedicles, which narrow the AP diameter of the spinal canal.   Cord: Unchanged T2 hyperintense signal and volume loss at the right aspect of the spinal cord C5-C6, consistent with myelomalacia. Additional T2 hyperintense foci in the bilateral anterior horn cells (series 8, image 31) at the level of C6-C7 appear more  prominent than on the prior exam but were likely present, also favored to represent mild malacia. Cord flattening at C4-C5 appears similar to the prior exam.   Posterior Fossa, vertebral arteries, paraspinal tissues: Negative.   Disc levels:   C2-C3: Mild disc bulge. Facet and uncovertebral hypertrophy. No spinal canal stenosis. Mild left neural foraminal narrowing, unchanged.   C3-C4: Status post interval ACDF. Facet and uncovertebral hypertrophy. No residual spinal canal stenosis. Mild-to-moderate left and mild right neural foraminal narrowing.   C4-C5: Status post interval ACDF. Redemonstrated facet and uncovertebral hypertrophy. Ligamentum flavum hypertrophy. Moderate to severe spinal canal stenosis and severe neural foraminal narrowing, which appear unchanged from the prior exam.   C5-C6: Status post fusion and decompression with redemonstrated osseous protrusion in the right subarticular space. Facet and uncovertebral hypertrophy. No spinal canal stenosis. Mild left neural foraminal narrowing, similar to prior.   C6-C7: Status post fusion and decompression. Facet and uncovertebral hypertrophy. No spinal canal stenosis. Moderate left neural foraminal narrowing, similar to prior.   C7-T1: Disc bulge with right foraminal protrusion. Facet and uncovertebral hypertrophy. Mild spinal canal stenosis, unchanged. Severe right and moderate to severe left neural foraminal narrowing is likely similar to prior.   IMPRESSION: 1. Status post interval extension of fusion to C3-C5, with persistent moderate to severe spinal canal stenosis and severe bilateral neural foraminal narrowing at C4-C5. 2. C3-C4 mild-to-moderate left and mild right neural foraminal narrowing, without residual spinal canal stenosis. 3. C5-C6 mild left neural foraminal narrowing, unchanged. 4. C6-C7 moderate left neural foraminal narrowing, unchanged. 5. C7-T1 mild spinal canal stenosis with severe right and  moderate to severe left neural foraminal narrowing, unchanged. 6. T2 hyperintense foci in the bilateral anterior horn cells at the level of C6-C7 appear more prominent than on the prior exam but were likely present. This likely represents myelomalacia.     Electronically Signed   By: Wiliam Ke M.D.   On: 08/14/2022 21:29      Electrodiagnostics: Alphonzo Lemmings, MD - 07/18/2022 8:30 AM EDT Formatting of this note is different from the original. Images from the original note were not included.  Test Date: 07/18/2022  Patient: Scott Howell DOB: [birth year] Physician: Dr. Theora Master Chart#: [id number] Sex: Male Ref Phys: Dr. Theora Master  Patient History: Patient is a 52 year-old male who presents with right hand weakness, numbness. Cannot stretch out fingers in the right hand. Had surgery on right elbow about 8  months ago and noticed symptoms after. Has neck pain. Past medical history is significant for neck surgery x5.  Exam: Decreased sensation is noted in the right hand, primarily in the 4th and 5th digits.  EMG & NCV Findings: Evaluation of the Right ulnar motor nerve showed prolonged distal onset latency (3.8 ms) and reduced amplitude (4.5 mV). All remaining nerves (as indicated in the following tables) were within normal limits.  EMG  Side Muscle Nerve Root Ins Act Fibs Psw Amp Dur Poly Recrt Int Dennie Bible Comment Right Abd Poll Brev Median C8-T1 Nml Nml Nml Nml Nml 0 Nml Nml Right 1stDorInt Ulnar C8-T1 Nml Nml Nml Incr >41ms 2+ moderate Reduced Nml Right PronatorTeres Median C6-7 Nml Nml Nml Nml Nml 0 Nml Nml Right FlexCarpiUln Ulnar C8,T1 Nml Nml Nml Incr >51ms 2+ moderate Reduced Nml Right Biceps Musculocut C5-6 Nml Nml Nml Nml Nml 0 Nml Nml Right Triceps Radial C6-7-8 Nml Nml Nml Nml Nml 0 Nml Nml  Impression: Abnormal study. There is electrodiagnostic evidence of a chronic, mild right ulnar mononeuropathy.  Thank you for the referral of this patient. It was  our privilege to participate in care of your patient. Feel free to contact us with any further questions.  _____________________________ Theora Master, MD  Nerve Conduction Studies Anti Sensory Summary Table  Site NR Peak (ms) Norm Peak (ms) P-T Amp (V) Norm P-T Amp Site1 Site2 Dist (cm) Vel (m/s) Norm Vel (m/s) Right Median Anti Sensory (2nd Digit) Wrist 3.2 <3.6 23.6 >10 Wrist 2nd Digit 13.0 41 >39 Right Radial Anti Sensory (Base 1st Digit) Wrist 2.5 <2.5 19.5 Wrist Base 1st Digit 0.0 Right Ulnar Anti Sensory (5th Digit) Wrist 2.9 <3.7 21.2 >15.0 Wrist 5th Digit 12.0 41 >38  Motor Summary Table  Site NR Onset (ms) Norm Onset (ms) O-P Amp (mV) Norm O-P Amp Site1 Site2 Dist (cm) Vel (m/s) Norm Vel (m/s) Right Median Motor (Abd Poll Brev) Wrist 3.4 <4.5 6.9 >3 Elbow Wrist 25.0 56 >48 Elbow 7.9 7.0 Right Ulnar Motor (Abd Dig Minimi) Wrist 3.8 <3.6 4.5 >5 B Elbow Wrist 24.0 56 >48 B Elbow 8.1 4.5 A Elbow B Elbow 10.0 59 >48 A Elbow 9.8 4.3  Comparison Summary Table  Site NR Peak (ms) Norm Peak (ms) P-T Amp (V) Site1 Site2 Delta-P (ms) Norm Delta (ms) Right Median/Ulnar Palm Comparison (Wrist - 8cm) Median Palm 2.0 <2.5 46.3 Median Palm Ulnar Palm 0.3 <0.3 Ulnar Palm 1.7 <2.5 19.8  Waveforms:   I have personally reviewed the images and electrodiagnostics and agree with the above interpretation.    Assessment and Plan: Cubital tunnel syndrome on right Weakness of right hand Nerve pain yes  Mr. Deguia is a pleasant 52 y.o. male with a severe cubital tunnel syndrome with severe weakness in the ulnar distribution as well as severe pain in the ulnar distribution.  He has had electrodiagnostic study prior which demonstrated an ulnar neuropathy, clinically given his severe wasting of his ADM and first dorsal interosseous musculature this would be clinically characterized as severe.  He has failed medication trials, has had 6+ months of hand and physical therapy.  Has tried ulnar  nerve padding.  He has had a previous decompression but continues to have worsening.  Given his severe and worsening symptoms we feel that he would benefit from an ulnar nerve decompression and transposition at the elbow, he would also been fit from a anterior interosseous nerve to ulnar nerve transfer given the severity of his weakness.  Given that pain  is a large portion of his symptoms and has not been controlled on medical management we feel that he would benefit from a ulnar nerve stimulator placement at the time of exploration and decompression.. The symptoms are causing a significant impact on the patient's life. I have utilized the care everywhere function in epic to review the outside records available from external health systems, and have personally reviewed relevant imaging and electrodiagnostic workup.    Thank you for involving me in the care of this patient.    Lovenia Kim MD/MSCR Neurosurgery - Peripheral Nerve Surgery

## 2022-09-07 ENCOUNTER — Encounter: Payer: Self-pay | Admitting: Neurosurgery

## 2022-09-07 ENCOUNTER — Ambulatory Visit (INDEPENDENT_AMBULATORY_CARE_PROVIDER_SITE_OTHER): Payer: MEDICAID | Admitting: Neurosurgery

## 2022-09-07 VITALS — BP 139/82 | Wt 224.0 lb

## 2022-09-07 DIAGNOSIS — R29898 Other symptoms and signs involving the musculoskeletal system: Secondary | ICD-10-CM

## 2022-09-07 DIAGNOSIS — G5621 Lesion of ulnar nerve, right upper limb: Secondary | ICD-10-CM

## 2022-09-07 DIAGNOSIS — M792 Neuralgia and neuritis, unspecified: Secondary | ICD-10-CM

## 2022-09-07 MED ORDER — GABAPENTIN 300 MG PO CAPS
300.0000 mg | ORAL_CAPSULE | Freq: Three times a day (TID) | ORAL | 3 refills | Status: DC
Start: 2022-09-07 — End: 2023-07-11

## 2022-09-07 NOTE — Addendum Note (Signed)
Addended by: Sharlot Gowda on: 09/07/2022 04:14 PM   Modules accepted: Orders

## 2022-09-07 NOTE — Patient Instructions (Addendum)
Please see below for information in regards to your upcoming surgery:   Planned surgery: 1) ulnar nerve transposition  2) anterior interosseous nerve to ulnar nerve transfer   3) ulnar nerve stimulator (Bioventus)   Surgery date: 10/04/22 at [facility] ([address], [place], [place] [address]) - you will find out your arrival time the business day before your surgery.   Pre-op appointment at [facility] Pre-admit Testing: we will call you with a date/time for this. Pre-admit testing is located on the first floor of the [facility] building, [address]. Please bring all prescriptions in the original prescription bottles to your appointment, even if you have reviewed medications by phone with a pharmacy representative. During this appointment, they will advise you which medications you can take the morning of surgery, and which medications you will need to hold for surgery. Pre-op labs may be done at your pre-op appointment. You are not required to fast for these labs. Should you need to change your pre-op appointment, please call Pre-admit testing at [phone].      How to contact us:  If you have any questions/concerns before or after surgery, you can reach [place] at [phone], or you can send a mychart message. We can be reached by phone or mychart 8am-4pm, Monday-Friday.  *Please note: Calls after 4pm are forwarded to a third party answering service. Mychart messages are not routinely monitored during evenings, weekends, and holidays. Please call our office to contact the answering service for urgent concerns during non-business hours.     If you have FMLA/disability paperwork, please drop it off or fax it to [phone], attention Patty.   Appointments/FMLA & disability paperwork: Patty & Cristin  Nurse: Royston Cowper  Medical assistants: Laurann Montana, & Lyla Son Physician Assistants: Manning Charity & Drake Leach Surgeons: Venetia Night, MD & Ernestine Mcmurray, MD

## 2022-09-09 ENCOUNTER — Ambulatory Visit
Admission: RE | Admit: 2022-09-09 | Discharge: 2022-09-09 | Disposition: A | Discharge status: Home / Self Care | Payer: MEDICAID | Source: Ambulatory Visit | Attending: Orthopedic Surgery | Admitting: Orthopedic Surgery

## 2022-09-09 ENCOUNTER — Other Ambulatory Visit: Payer: Self-pay

## 2022-09-09 DIAGNOSIS — Z01818 Encounter for other preprocedural examination: Secondary | ICD-10-CM

## 2022-09-09 DIAGNOSIS — R2689 Other abnormalities of gait and mobility: Secondary | ICD-10-CM | POA: Diagnosis not present

## 2022-09-12 ENCOUNTER — Other Ambulatory Visit: Payer: Self-pay | Admitting: Family Medicine

## 2022-09-12 DIAGNOSIS — M5412 Radiculopathy, cervical region: Secondary | ICD-10-CM

## 2022-09-19 ENCOUNTER — Other Ambulatory Visit: Payer: Self-pay | Admitting: Internal Medicine

## 2022-09-19 DIAGNOSIS — K219 Gastro-esophageal reflux disease without esophagitis: Secondary | ICD-10-CM

## 2022-09-19 DIAGNOSIS — I10 Essential (primary) hypertension: Secondary | ICD-10-CM

## 2022-09-19 DIAGNOSIS — E7800 Pure hypercholesterolemia, unspecified: Secondary | ICD-10-CM

## 2022-09-20 NOTE — Telephone Encounter (Signed)
Unable to refill per protocol, Rx request is too soon. Last refill 06/08/22 for 90 and 1 refill.  Requested Prescriptions  Pending Prescriptions Disp Refills   atorvastatin (LIPITOR) 40 MG tablet [Pharmacy Med Name: ATORVASTATIN 40 MG TABLET] 90 tablet 1    Sig: TAKE 1 TABLET BY MOUTH EVERY DAY     Cardiovascular:  Antilipid - Statins Failed - 09/19/2022 11:20 AM      Failed - Lipid Panel in normal range within the last 12 months    Cholesterol  Date Value Ref Range Status  06/08/2022 176 <200 mg/dL Final   LDL Cholesterol (Calc)  Date Value Ref Range Status  06/08/2022 108 (H) mg/dL (calc) Final    Comment:    Reference range: <100 . Desirable range <100 mg/dL for primary prevention;   <70 mg/dL for patients with CHD or diabetic patients  with > or = 2 CHD risk factors. Marland Kitchen LDL-C is now calculated using the Martin-Hopkins  calculation, which is a validated novel method providing  better accuracy than the Friedewald equation in the  estimation of LDL-C.  Horald Pollen et al. Lenox Ahr. 7846;962(95): 2061-2068  (http://education.[employer].com/faq/FAQ164)    HDL  Date Value Ref Range Status  06/08/2022 38 (L) > OR = 40 mg/dL Final   Triglycerides  Date Value Ref Range Status  06/08/2022 181 (H) <150 mg/dL Final         Passed - Patient is not pregnant      Passed - Valid encounter within last 12 months    Recent Outpatient Visits           3 months ago Hypertension goal BP (blood pressure) < 140/90   [facility] Margarita Mail, DO   9 months ago Hypertension goal BP (blood pressure) < 140/90   [facility] Margarita Mail, DO   10 months ago COVID-19   [facility] Margarita Mail, DO   1 year ago Trauma   [facility] Margarita Mail, DO   1 year ago Hypertension goal BP (blood pressure) < 140/90   [facility] Margarita Mail, DO       Future Appointments             In 2 months Margarita Mail, DO [facility]             lisinopril (ZESTRIL) 10 MG tablet [Pharmacy Med Name: LISINOPRIL 10 MG TABLET] 90 tablet 1    Sig: TAKE 1 TABLET BY MOUTH EVERY DAY     Cardiovascular:  ACE Inhibitors Passed - 09/19/2022 11:20 AM      Passed - Cr in normal range and within 180 days    Creat  Date Value Ref Range Status  06/08/2022 0.87 0.70 - 1.30 mg/dL Final         Passed - K in normal range and within 180 days    Potassium  Date Value Ref Range Status  06/08/2022 4.9 3.5 - 5.3 mmol/L Final  06/08/2014 3.7 mmol/L Final    Comment:    3.5-5.1 NOTE: New Reference Range  04/22/14          Passed - Patient is not pregnant      Passed - Last BP in normal range    BP Readings from Last 1 Encounters:  09/07/22 139/82         Passed - Valid encounter within last 6 months  Recent Outpatient Visits           3 months ago Hypertension goal BP (blood pressure) < 140/90   [facility] Margarita Mail, DO   9 months ago Hypertension goal BP (blood pressure) < 140/90   [facility] Margarita Mail, DO   10 months ago COVID-19   [facility] Margarita Mail, DO   1 year ago Trauma   [facility] Margarita Mail, DO   1 year ago Hypertension goal BP (blood pressure) < 140/90   [facility] Margarita Mail, DO       Future Appointments             In 2 months Margarita Mail, DO [facility]             famotidine (PEPCID) 20 MG tablet [Pharmacy Med Name: FAMOTIDINE 20 MG TABLET] 180 tablet 1    Sig: TAKE 1 TABLET BY MOUTH TWICE A DAY     Gastroenterology:  H2 Antagonists Passed - 09/19/2022 11:20 AM      Passed - Valid encounter within last 12 months    Recent Outpatient  Visits           3 months ago Hypertension goal BP (blood pressure) < 140/90   [facility] Margarita Mail, DO   9 months ago Hypertension goal BP (blood pressure) < 140/90   [facility] Margarita Mail, DO   10 months ago COVID-19   [facility] Margarita Mail, DO   1 year ago Trauma   [facility] Margarita Mail, DO   1 year ago Hypertension goal BP (blood pressure) < 140/90   [facility] Margarita Mail, DO       Future Appointments             In 2 months Margarita Mail, DO [facility]

## 2022-09-26 ENCOUNTER — Encounter
Admission: RE | Admit: 2022-09-26 | Discharge: 2022-09-26 | Disposition: A | Discharge status: Home / Self Care | Payer: MEDICAID | Source: Ambulatory Visit | Attending: Neurosurgery | Admitting: Neurosurgery

## 2022-09-26 HISTORY — DX: Unspecified cord compression: G95.20

## 2022-09-26 HISTORY — DX: Chronic pain syndrome: G89.4

## 2022-09-26 HISTORY — DX: Presence of unspecified artificial knee joint: Z96.659

## 2022-09-26 HISTORY — DX: Low back pain, unspecified: M54.50

## 2022-09-26 HISTORY — DX: Abnormal levels of other serum enzymes: R74.8

## 2022-09-26 HISTORY — DX: Disease of spinal cord, unspecified: G95.9

## 2022-09-26 HISTORY — DX: Other long term (current) drug therapy: Z79.899

## 2022-09-26 HISTORY — DX: Other specified postprocedural states: Z98.890

## 2022-09-26 HISTORY — DX: Male erectile dysfunction, unspecified: N52.9

## 2022-09-26 HISTORY — DX: Other chronic pain: G89.29

## 2022-09-26 HISTORY — DX: Constipation, unspecified: K59.00

## 2022-09-26 HISTORY — DX: Personal history of transient ischemic attack (TIA), and cerebral infarction without residual deficits: Z86.73

## 2022-09-26 HISTORY — DX: Benign prostatic hyperplasia without lower urinary tract symptoms: N40.0

## 2022-09-26 HISTORY — DX: Benign neoplasm of colon, unspecified: D12.6

## 2022-09-26 HISTORY — DX: Muscle weakness (generalized): M62.81

## 2022-09-26 HISTORY — DX: Spinal stenosis, lumbar region without neurogenic claudication: M48.061

## 2022-09-26 HISTORY — DX: Hypokalemia: E87.6

## 2022-09-26 HISTORY — DX: Postlaminectomy syndrome, not elsewhere classified: M96.1

## 2022-09-26 NOTE — Patient Instructions (Signed)
Your procedure is scheduled on: Tuesday August 20  Report to the Registration Desk on the 1st floor of the [facility] To find out your arrival time, please call [phone] between 1PM - 3PM on: Monday August 19  If your arrival time is 6:00 am, do not arrive before that time as the [facility] entrance doors do not open until 6:00 am.  REMEMBER: Instructions that are not followed completely may result in serious medical risk, up to and including death; or upon the discretion of your surgeon and anesthesiologist your surgery may need to be rescheduled.  Do not eat food after midnight the night before surgery.  No gum chewing or hard candies.  You may however, drink CLEAR liquids up to 2 hours before you are scheduled to arrive for your surgery. Do not drink anything within 2 hours of your scheduled arrival time.  Clear liquids include: - water  - apple juice without pulp - gatorade (not RED colors) - black coffee or tea (Do NOT add milk or creamers to the coffee or tea) Do NOT drink anything that is not on this list.  One week prior to surgery: Stop Anti-inflammatories (NSAIDS) such as Advil, Aleve, Ibuprofen, Motrin, Naproxen, Naprosyn and Aspirin based products such as Excedrin, Goody's Powder, BC Powder.  Stop ANY OVER THE COUNTER supplements until after surgery. You may however, continue to take Tylenol if needed for pain up until the day of surgery.  Continue taking all prescribed medications with the exception of the following:  Follow recommendations from Cardiologist or PCP regarding stopping blood thinners.  TAKE ONLY THESE MEDICATIONS THE MORNING OF SURGERY WITH A SIP OF WATER:  albuterol (VENTOLIN HFA)  budesonide-formoterol (SYMBICORT)  ipratropium-albuterol (DUONEB)  famotidine (PEPCID)  (take one the night before and one on the morning of surgery - helps to prevent nausea after surgery.)  Use inhalers on the day of surgery and bring to the hospital.  No  Alcohol for 24 hours before or after surgery.  No Smoking including e-cigarettes for 24 hours before surgery.  No chewable tobacco products for at least 6 hours before surgery.  No nicotine patches on the day of surgery.  Do not use any "recreational" drugs for at least a week (preferably 2 weeks) before your surgery.  Please be advised that the combination of cocaine and anesthesia may have negative outcomes, up to and including death. If you test positive for cocaine, your surgery will be cancelled.  On the morning of surgery brush your teeth with toothpaste and water, you may rinse your mouth with mouthwash if you wish. Do not swallow any toothpaste or mouthwash.  Use CHG Soap or wipes as directed on instruction sheet.  Do not wear jewelry, make-up, hairpins, clips or nail polish.  Do not wear lotions, powders, or perfumes.   Do not shave body hair from the neck down 48 hours before surgery.  Do not bring valuables to the hospital. [facility] is not responsible for any missing/lost belongings or valuables.   Notify your doctor if there is any change in your medical condition (cold, fever, infection).  Wear comfortable clothing (specific to your surgery type) to the hospital.  After surgery, you can help prevent lung complications by doing breathing exercises.  Take deep breaths and cough every 1-2 hours.   If you are being discharged the day of surgery, you will not be allowed to drive home. You will need a responsible individual to drive you home and stay with  you for 24 hours after surgery.   If you are taking public transportation, you will need to have a responsible individual with you.  Please call the Pre-admissions Testing Dept. at [phone] if you have any questions about these instructions.  Surgery Visitation Policy:  Patients having surgery or a procedure may have two visitors.  Children under the age of 80 must have an adult with them who is not the  patient.         Preparing for Surgery with CHLORHEXIDINE GLUCONATE (CHG) Soap  Chlorhexidine Gluconate (CHG) Soap  o An antiseptic cleaner that kills germs and bonds with the skin to continue killing germs even after washing  o Used for showering the night before surgery and morning of surgery  Before surgery, you can play an important role by reducing the number of germs on your skin.  CHG (Chlorhexidine gluconate) soap is an antiseptic cleanser which kills germs and bonds with the skin to continue killing germs even after washing.  Please do not use if you have an allergy to CHG or antibacterial soaps. If your skin becomes reddened/irritated stop using the CHG.  1. Shower the NIGHT BEFORE SURGERY and the MORNING OF SURGERY with CHG soap.  2. If you choose to wash your hair, wash your hair first as usual with your normal shampoo.  3. After shampooing, rinse your hair and body thoroughly to remove the shampoo.  4. Use CHG as you would any other liquid soap. You can apply CHG directly to the skin and wash gently with a scrungie or a clean washcloth.  5. Apply the CHG soap to your body only from the neck down. Do not use on open wounds or open sores. Avoid contact with your eyes, ears, mouth, and genitals (private parts). Wash face and genitals (private parts) with your normal soap.  6. Wash thoroughly, paying special attention to the area where your surgery will be performed.  7. Thoroughly rinse your body with warm water.  8. Do not shower/wash with your normal soap after using and rinsing off the CHG soap.  9. Pat yourself dry with a clean towel.  10. Wear clean pajamas to bed the night before surgery.  12. Place clean sheets on your bed the night of your first shower and do not sleep with pets.  13. Shower again with the CHG soap on the day of surgery prior to arriving at the hospital.  14. Do not apply any deodorants/lotions/powders.  15. Please wear clean clothes  to the hospital.

## 2022-10-04 ENCOUNTER — Encounter
Admission: RE | Disposition: A | Discharge status: Home / Self Care | Payer: Self-pay | Source: Home / Self Care | Attending: Neurosurgery

## 2022-10-04 ENCOUNTER — Other Ambulatory Visit: Payer: Self-pay

## 2022-10-04 ENCOUNTER — Encounter: Payer: Self-pay | Admitting: Neurosurgery

## 2022-10-04 ENCOUNTER — Ambulatory Visit: Payer: MEDICAID | Admitting: Urgent Care

## 2022-10-04 ENCOUNTER — Ambulatory Visit
Admission: RE | Admit: 2022-10-04 | Discharge: 2022-10-04 | Disposition: A | Discharge status: Home / Self Care | Payer: MEDICAID | Attending: Neurosurgery | Admitting: Neurosurgery

## 2022-10-04 ENCOUNTER — Ambulatory Visit: Payer: MEDICAID | Admitting: Anesthesiology

## 2022-10-04 ENCOUNTER — Encounter: Payer: Self-pay | Admitting: *Deleted

## 2022-10-04 DIAGNOSIS — Z7951 Long term (current) use of inhaled steroids: Secondary | ICD-10-CM | POA: Insufficient documentation

## 2022-10-04 DIAGNOSIS — R29898 Other symptoms and signs involving the musculoskeletal system: Secondary | ICD-10-CM | POA: Diagnosis not present

## 2022-10-04 DIAGNOSIS — M797 Fibromyalgia: Secondary | ICD-10-CM | POA: Diagnosis not present

## 2022-10-04 DIAGNOSIS — F319 Bipolar disorder, unspecified: Secondary | ICD-10-CM | POA: Diagnosis not present

## 2022-10-04 DIAGNOSIS — I10 Essential (primary) hypertension: Secondary | ICD-10-CM | POA: Insufficient documentation

## 2022-10-04 DIAGNOSIS — J4489 Other specified chronic obstructive pulmonary disease: Secondary | ICD-10-CM | POA: Diagnosis not present

## 2022-10-04 DIAGNOSIS — F17210 Nicotine dependence, cigarettes, uncomplicated: Secondary | ICD-10-CM | POA: Diagnosis not present

## 2022-10-04 DIAGNOSIS — Z981 Arthrodesis status: Secondary | ICD-10-CM | POA: Diagnosis not present

## 2022-10-04 DIAGNOSIS — Z01818 Encounter for other preprocedural examination: Secondary | ICD-10-CM

## 2022-10-04 DIAGNOSIS — M792 Neuralgia and neuritis, unspecified: Secondary | ICD-10-CM

## 2022-10-04 DIAGNOSIS — G5621 Lesion of ulnar nerve, right upper limb: Secondary | ICD-10-CM

## 2022-10-04 DIAGNOSIS — K219 Gastro-esophageal reflux disease without esophagitis: Secondary | ICD-10-CM | POA: Diagnosis not present

## 2022-10-04 HISTORY — PX: ULNAR NERVE TRANSPOSITION: SHX2595

## 2022-10-04 HISTORY — DX: Presence of cardiac pacemaker: Z95.0

## 2022-10-04 HISTORY — DX: Pneumonia, unspecified organism: J18.9

## 2022-10-04 SURGERY — ULNAR NERVE DECOMPRESSION/TRANSPOSITION
Anesthesia: General | Laterality: Right

## 2022-10-04 MED ORDER — SEVOFLURANE IN SOLN
RESPIRATORY_TRACT | Status: AC
  Filled 2022-10-04: qty 250

## 2022-10-04 MED ORDER — BUPIVACAINE HCL (PF) 0.5 % IJ SOLN
INTRAMUSCULAR | Status: AC
  Filled 2022-10-04: qty 30

## 2022-10-04 MED ORDER — ONDANSETRON HCL 4 MG/2ML IJ SOLN
INTRAMUSCULAR | Status: AC
  Filled 2022-10-04: qty 2

## 2022-10-04 MED ORDER — VANCOMYCIN HCL IN DEXTROSE 1-5 GM/200ML-% IV SOLN
INTRAVENOUS | Status: AC
  Filled 2022-10-04: qty 200

## 2022-10-04 MED ORDER — FENTANYL CITRATE (PF) 100 MCG/2ML IJ SOLN
INTRAMUSCULAR | Status: DC | PRN
  Administered 2022-10-04 (×2): 50 ug via INTRAVENOUS
  Administered 2022-10-04 (×2): 25 ug via INTRAVENOUS
  Administered 2022-10-04: 50 ug via INTRAVENOUS

## 2022-10-04 MED ORDER — CEFAZOLIN IN SODIUM CHLORIDE 2-0.9 GM/100ML-% IV SOLN
2.0000 g | Freq: Once | INTRAVENOUS | Status: AC
  Administered 2022-10-04: 2 g via INTRAVENOUS

## 2022-10-04 MED ORDER — CHLORHEXIDINE GLUCONATE 0.12 % MT SOLN
OROMUCOSAL | Status: AC
  Filled 2022-10-04: qty 15

## 2022-10-04 MED ORDER — FENTANYL CITRATE (PF) 100 MCG/2ML IJ SOLN
INTRAMUSCULAR | Status: AC
  Filled 2022-10-04: qty 2

## 2022-10-04 MED ORDER — LIDOCAINE HCL (PF) 2 % IJ SOLN
INTRAMUSCULAR | Status: AC
  Filled 2022-10-04: qty 5

## 2022-10-04 MED ORDER — ONDANSETRON HCL 4 MG/2ML IJ SOLN
INTRAMUSCULAR | Status: DC | PRN
Start: 2022-10-04 — End: 2022-10-04
  Administered 2022-10-04: 4 mg via INTRAVENOUS

## 2022-10-04 MED ORDER — DROPERIDOL 2.5 MG/ML IJ SOLN: 0.6250 mg | Freq: Once | INTRAMUSCULAR | Status: DC | PRN

## 2022-10-04 MED ORDER — EPINEPHRINE PF 1 MG/ML IJ SOLN
INTRAMUSCULAR | Status: AC
  Filled 2022-10-04: qty 1

## 2022-10-04 MED ORDER — FENTANYL CITRATE (PF) 100 MCG/2ML IJ SOLN
25.0000 ug | INTRAMUSCULAR | Status: AC | PRN
  Administered 2022-10-04: 50 ug via INTRAVENOUS
  Administered 2022-10-04: 25 ug via INTRAVENOUS
  Administered 2022-10-04: 50 ug via INTRAVENOUS
  Administered 2022-10-04 (×3): 25 ug via INTRAVENOUS

## 2022-10-04 MED ORDER — BUPIVACAINE-EPINEPHRINE (PF) 0.5% -1:200000 IJ SOLN
INTRAMUSCULAR | Status: DC | PRN
  Administered 2022-10-04: 4 mL via PERINEURAL
  Administered 2022-10-04: 5 mL

## 2022-10-04 MED ORDER — CHLORHEXIDINE GLUCONATE 0.12 % MT SOLN
15.0000 mL | Freq: Once | OROMUCOSAL | Status: AC
  Administered 2022-10-04: 15 mL via OROMUCOSAL

## 2022-10-04 MED ORDER — MIDAZOLAM HCL 2 MG/2ML IJ SOLN
INTRAMUSCULAR | Status: AC
  Filled 2022-10-04: qty 2

## 2022-10-04 MED ORDER — ORAL CARE MOUTH RINSE: 15.0000 mL | Freq: Once | OROMUCOSAL | Status: AC

## 2022-10-04 MED ORDER — PROPOFOL 10 MG/ML IV BOLUS
INTRAVENOUS | Status: AC
  Filled 2022-10-04: qty 40

## 2022-10-04 MED ORDER — VANCOMYCIN HCL IN DEXTROSE 1-5 GM/200ML-% IV SOLN
1000.0000 mg | Freq: Once | INTRAVENOUS | Status: AC
  Administered 2022-10-04: 1000 mg via INTRAVENOUS

## 2022-10-04 MED ORDER — CEFAZOLIN SODIUM-DEXTROSE 2-4 GM/100ML-% IV SOLN
INTRAVENOUS | Status: AC
  Filled 2022-10-04: qty 100

## 2022-10-04 MED ORDER — OXYCODONE HCL 5 MG PO TABS
5.0000 mg | ORAL_TABLET | Freq: Once | ORAL | Status: AC | PRN
  Administered 2022-10-04: 5 mg via ORAL

## 2022-10-04 MED ORDER — MIDAZOLAM HCL 2 MG/2ML IJ SOLN
INTRAMUSCULAR | Status: DC | PRN
  Administered 2022-10-04: 2 mg via INTRAVENOUS

## 2022-10-04 MED ORDER — OXYCODONE HCL 5 MG PO CAPS: 5.0000 mg | ORAL_CAPSULE | Freq: Four times a day (QID) | ORAL | 0 refills | Status: DC | PRN

## 2022-10-04 MED ORDER — DEXAMETHASONE SODIUM PHOSPHATE 10 MG/ML IJ SOLN
INTRAMUSCULAR | Status: AC
  Filled 2022-10-04: qty 1

## 2022-10-04 MED ORDER — PROMETHAZINE HCL 25 MG/ML IJ SOLN: 6.2500 mg | INTRAMUSCULAR | Status: DC | PRN

## 2022-10-04 MED ORDER — 0.9 % SODIUM CHLORIDE (POUR BTL) OPTIME
TOPICAL | Status: DC | PRN
  Administered 2022-10-04: 200 mL

## 2022-10-04 MED ORDER — LACTATED RINGERS IV SOLN: INTRAVENOUS | Status: DC

## 2022-10-04 MED ORDER — OXYCODONE HCL 5 MG PO TABS
ORAL_TABLET | ORAL | Status: AC
  Filled 2022-10-04: qty 1

## 2022-10-04 MED ORDER — OXYCODONE HCL 5 MG/5ML PO SOLN: 5.0000 mg | Freq: Once | ORAL | Status: AC | PRN

## 2022-10-04 MED ORDER — DEXAMETHASONE SODIUM PHOSPHATE 10 MG/ML IJ SOLN
INTRAMUSCULAR | Status: DC | PRN
  Administered 2022-10-04: 6 mg via INTRAVENOUS

## 2022-10-04 SURGICAL SUPPLY — 33 items
ADH SKN CLS APL DERMABOND .7 (GAUZE/BANDAGES/DRESSINGS) ×2
APL PRP STRL LF DISP 70% ISPRP (MISCELLANEOUS) ×1
BNDG GAUZE DERMACEA FLUFF 4 (GAUZE/BANDAGES/DRESSINGS) IMPLANT
BNDG GZE DERMACEA 4 6PLY (GAUZE/BANDAGES/DRESSINGS) ×1
CHLORAPREP W/TINT 26 (MISCELLANEOUS) ×1 IMPLANT
CORD BIP STRL DISP 12FT (MISCELLANEOUS) ×1 IMPLANT
DERMABOND ADVANCED .7 DNX12 (GAUZE/BANDAGES/DRESSINGS) IMPLANT
DRAPE SURG 17X11 SM STRL (DRAPES) ×2 IMPLANT
FORCEPS JEWEL BIP 4-3/4 STR (INSTRUMENTS) ×1 IMPLANT
GAUZE SPONGE 4X4 12PLY STRL (GAUZE/BANDAGES/DRESSINGS) ×1 IMPLANT
GLOVE BIOGEL PI IND STRL 8 (GLOVE) ×1 IMPLANT
GLOVE SURG SYN 7.5 E (GLOVE) ×1 IMPLANT
GLOVE SURG SYN 7.5 PF PI (GLOVE) ×1 IMPLANT
GOWN STRL REUS W/ TWL LRG LVL3 (GOWN DISPOSABLE) ×1 IMPLANT
GOWN STRL REUS W/ TWL XL LVL3 (GOWN DISPOSABLE) ×1 IMPLANT
GOWN STRL REUS W/TWL LRG LVL3 (GOWN DISPOSABLE) ×1
GOWN STRL REUS W/TWL XL LVL3 (GOWN DISPOSABLE) ×1
KIT LEAD (Spinal Cord Stimulator) ×1 IMPLANT
KIT STIMROUTER NEUROMODULATION (Spinal Cord Stimulator) IMPLANT
KIT TURNOVER KIT A (KITS) ×1 IMPLANT
MANIFOLD NEPTUNE II (INSTRUMENTS) ×1 IMPLANT
NDL HYPO 25X1 1.5 SAFETY (NEEDLE) ×1 IMPLANT
NEEDLE HYPO 25X1 1.5 SAFETY (NEEDLE) ×1 IMPLANT
NS IRRIG 500ML POUR BTL (IV SOLUTION) ×1 IMPLANT
PACK EXTREMITY ARMC (MISCELLANEOUS) ×1 IMPLANT
PROBE NEUROSIGN BIPOL (MISCELLANEOUS) IMPLANT
PROBE NEUROSIGN BIPOLAR (MISCELLANEOUS) ×1
SUT DVC VLOC 90 3-0 CV23 UNDY (SUTURE) IMPLANT
SUT VIC AB 2-0 SH 27 (SUTURE) ×2
SUT VIC AB 2-0 SH 27XBRD (SUTURE) IMPLANT
SUT VIC AB 3-0 SH 27 (SUTURE) ×2
SUT VIC AB 3-0 SH 27X BRD (SUTURE) IMPLANT
WATER STERILE IRR 500ML POUR (IV SOLUTION) ×1 IMPLANT

## 2022-10-04 NOTE — Discharge Summary (Signed)
Discharge Summary  Patient ID: Scott Howell MRN: [id number] DOB/AGE: [birth year] 52 y.o.  Admit date: 10/04/2022 Discharge date: 10/04/2022  Admission Diagnoses: Cubital tunnel syndrome on the right G56.21 Weakness of the right hand R29.898 Nerve pain M79.2 Discharge Diagnoses:  Active Problems:   * No active hospital problems. *   Discharged Condition: good  Hospital Course:  Scott Howell is a 52 y.o presenting with cubital tunnel syndrome and right hand weakness s/p ulnar nerve decompression AIN to ulnar nerve transfer and ulnar nerve stimulator placement. His intraoperative course was uncomplicated. He was recovered in PACU and discharged home on POD0 with medications to take as needed for pain.  Consults: None  Significant Diagnostic Studies: none  Treatments: surgery: as above. Please see separately dictated operative report for further details  Discharge Exam: Blood pressure 125/68, pulse 92, temperature 97.7 F (36.5 C), temperature source Oral, resp. rate 16, height 6\' 1"  (1.854 m), weight 104.3 kg, SpO2 98%. CN grossly intact MAEW Incision c/d/I with post-op dressing in place  Disposition: Discharge disposition: 01-Home or Self Care        Allergies as of 10/04/2022       Reactions   Acetaminophen Other (See Comments)   Pt states that it makes him hyper.  .         Medication List     TAKE these medications    albuterol 108 (90 Base) MCG/ACT inhaler Commonly known as: Ventolin HFA INHALE 2 PUFFS INTO LUNGS EVERY 6 HOURS AS NEEDED FOR WHEEZING   atorvastatin 40 MG tablet Commonly known as: LIPITOR Take 1 tablet (40 mg total) by mouth daily.   budesonide-formoterol 160-4.5 MCG/ACT inhaler Commonly known as: Symbicort Inhale 2 puffs into the lungs 2 (two) times daily.   divalproex 250 MG DR tablet Commonly known as: DEPAKOTE Take 1 tablet (250 mg total) by mouth 3 (three) times daily.   famotidine 20 MG tablet Commonly known as:  PEPCID Take 1 tablet (20 mg total) by mouth 2 (two) times daily.   gabapentin 300 MG capsule Commonly known as: Neurontin Take 1 capsule (300 mg total) by mouth 3 (three) times daily.   ipratropium-albuterol 0.5-2.5 (3) MG/3ML Soln Commonly known as: DUONEB Take 3 mLs by nebulization 2 (two) times daily.   lisinopril 10 MG tablet Commonly known as: ZESTRIL Take 1 tablet (10 mg total) by mouth daily.   meloxicam 15 MG tablet Commonly known as: MOBIC Take 15 mg by mouth at bedtime.   oxycodone 5 MG capsule Commonly known as: OXY-IR Take 1 capsule (5 mg total) by mouth every 6 (six) hours as needed for up to 3 days.   QUEtiapine 100 MG tablet Commonly known as: SEROQUEL Take 1 tablet (100 mg total) by mouth at bedtime.   QUEtiapine 200 MG tablet Commonly known as: SEROQUEL Take 500 mg by mouth at bedtime.   tiZANidine 4 MG capsule Commonly known as: ZANAFLEX Take 2 mg by mouth 3 (three) times daily.         Signed: Susanne Borders 10/04/2022, 3:15 PM

## 2022-10-04 NOTE — Interval H&P Note (Signed)
History and Physical Interval Note:  10/04/2022 12:47 PM  Scott Howell  has presented today for surgery, with the diagnosis of Cubital tunnel syndrome on right  G56.21  Weakness of right hand R29.898  Nerve pain M79.2.  The various methods of treatment have been discussed with the patient and family. After consideration of risks, benefits and other options for treatment, the patient has consented to  Procedure(s) with comments: ULNAR NERVE TRANSPOSITION (Right) - NO BLOCK ANTERIOR INTEROSSEOUS NERVE TO ULNAR NERVE TRANSFER (Right) - NO BLOCK ULNAR NERVE STIMULATOR (Right) - NO BLOCK as a surgical intervention.  The patient's history has been reviewed, patient examined, no change in status, stable for surgery.  I have reviewed the patient's chart and labs.  Questions were answered to the patient's satisfaction.     Lovenia Kim

## 2022-10-04 NOTE — Anesthesia Preprocedure Evaluation (Addendum)
Anesthesia Evaluation  Patient identified by MRN, date of birth, ID band Patient awake    Reviewed: Allergy & Precautions, H&P , NPO status , Patient's Chart, lab work & pertinent test results  Airway Mallampati: IV  TM Distance: >3 FB Neck ROM: full    Dental  (+) Edentulous Upper, Edentulous Lower   Pulmonary shortness of breath, asthma , COPD, Current Smoker   Pulmonary exam normal        Cardiovascular Exercise Tolerance: Good hypertension, Normal cardiovascular exam     Neuro/Psych Seizures - (Last february 2024),     Bipolar Disorder   S/p  C3-5 ANTERIOR CERVICAL               DECOMPRESSION/DISCECTOMY   Neuromuscular disease (Cervical post-laminectomy syndrome, partial use of right arm) CVA  negative psych ROS   GI/Hepatic negative GI ROS, Neg liver ROS,,,  Endo/Other  negative endocrine ROS    Renal/GU      Musculoskeletal  (+) Arthritis ,  Fibromyalgia -  Abdominal  (+) + obese  Peds  Hematology negative hematology ROS (+)   Anesthesia Other Findings Past Medical History: No date: Adenomatous polyp of colon No date: Arthritis No date: Asthma No date: Benign fibroma of prostate No date: Bipolar 1 disorder (HCC) No date: Cervical myelopathy (HCC) No date: Cervical post-laminectomy syndrome No date: Cervical spinal cord compression (HCC) No date: Chronic knee pain No date: Chronic pain associated with significant psychosocial  dysfunction No date: Chronic prescription benzodiazepine use No date: Constipation No date: COPD (chronic obstructive pulmonary disease) (HCC) No date: Decreased motor strength No date: Drug abuse, opioid type (HCC) No date: Dyspnea No date: Elevated liver enzymes No date: Erectile dysfunction No date: Fibromyalgia No date: GERD (gastroesophageal reflux disease) No date: H/O ETOH abuse No date: Headache No date: High cholesterol 11/01/2021: History of 2019 novel coronavirus  disease (COVID-19) No date: History of transient cerebral ischemia No date: Homicidal ideation No date: Hypertension No date: Hypokalemia No date: Living accommodation issues     Comment:  a.) as of 03/29/2021 --> living in camper No date: Lower back pain No date: Lumbar canal stenosis No date: Marijuana use No date: 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 No date: S/P TKR (total knee replacement) No date: Schizophrenia (HCC) 11/2020: Seizures (HCC) No date: Status post hardware removal No date: Stroke [facility])     Comment:  no deficits No date: Tobacco use  Past Surgical History: 01/13/2021: ANTERIOR CERVICAL DECOMP/DISCECTOMY FUSION; N/A     Comment:  Procedure: C3-5 ANTERIOR CERVICAL               DECOMPRESSION/DISCECTOMY FUSION;  Surgeon: Venetia Night, MD;  Location: [facility] ORS;  Service: Neurosurgery;              Laterality: N/A; No date: CERVICAL SPINE SURGERY     Comment:  4 No date: CLAVICLE SURGERY 06/29/2022: COLONOSCOPY WITH PROPOFOL; N/A     Comment:  Procedure: COLONOSCOPY WITH PROPOFOL;  Surgeon: Wyline Mood, MD;  Location: [facility] ENDOSCOPY;  Service:               Gastroenterology;  Laterality: N/A;  USES MED TRANS; WILL              NEED TIME ON FRIDAY, 06/24/2022 04/20/2015: HARDWARE  REMOVAL; Left     Comment:  Procedure: HARDWARE REMOVAL left leg;  Surgeon: Deeann Saint, MD;  Location: [facility] ORS;  Service: Orthopedics;                Laterality: Left; No date: KNEE SURGERY; Left No date: NECK SURGERY     Comment:  x6 No date: SHOULDER SURGERY; Left 04/12/2021: TOTAL KNEE ARTHROPLASTY; Left     Comment:  Procedure: TOTAL KNEE ARTHROPLASTY;  Surgeon: Lyndle Herrlich, MD;  Location: [facility] ORS;  Service: Orthopedics;               Laterality: Left; 11/17/2021: ULNAR NERVE TRANSPOSITION; Right     Comment:  Procedure: SUBCUTANEOUS TRANSPOSITION OF ULNAR NERVE,               RIGHT ELBOW;   Surgeon: Christena Flake, MD;  Location: [facility]              ORS;  Service: Orthopedics;  Laterality: Right;     Reproductive/Obstetrics negative OB ROS                             Anesthesia Physical Anesthesia Plan  ASA: 3  Anesthesia Plan: General LMA   Post-op Pain Management: Toradol IV (intra-op)* and Precedex   Induction: Intravenous  PONV Risk Score and Plan: 1 and Dexamethasone, Ondansetron, Midazolam and Treatment may vary due to age or medical condition  Airway Management Planned: LMA  Additional Equipment:   Intra-op Plan:   Post-operative Plan: Extubation in OR  Informed Consent: I have reviewed the patients History and Physical, chart, labs and discussed the procedure including the risks, benefits and alternatives for the proposed anesthesia with the patient or authorized representative who has indicated his/her understanding and acceptance.     Dental Advisory Given  Plan Discussed with: Anesthesiologist, CRNA and Surgeon  Anesthesia Plan Comments:        Anesthesia Quick Evaluation

## 2022-10-04 NOTE — Discharge Instructions (Addendum)
NEUROSURGERY DISCHARGE INSTRUCTIONS  Admission diagnosis: Cubital tunnel syndrome on right  G56.21  Weakness of right hand R29.898  Nerve pain M79.2  Operative procedure: ULNAR NERVE TRANSPOSITION (Right) ANTERIOR INTEROSSEOUS NERVE TO ULNAR NERVE TRANSFER (Right) ULNAR NERVE STIMULATOR (Right)   What to do after you leave the hospital:  Recommended diet: regular diet. Increase protein intake to promote wound healing.  Recommended activity: no lifting, driving, or strenuous exercise for 2 weeks . You should walk multiple times per day  Special Instructions  No straining, no heavy lifting > 10lbs x 4 weeks.  Keep incision area clean and dry. May shower in 2 days. No baths or pools for 6 weeks.  Please remove dressing tomorrow, no need to apply a bandage afterwards  You have no sutures to remove, the skin is closed with adhesive  Please take pain medications as directed. Take a stool softener if on pain medications   Please Report any of the following: Nausea or Vomiting, Temperature is greater than 101.51F (38.1C) degrees, Dizziness, Abdominal Pain, Difficulty Breathing or Shortness of Breath, Inability to Eat, drink Fluids, or Take medications, Bleeding, swelling, or drainage from surgical incision sites, New numbness or weakness, and Bowel or bladder dysfunction to the neurosurgeon on call. How to contact us:  If you have any questions/concerns before or after surgery, you can reach [place] at [phone], or you can send a mychart message. We can be reached by phone or mychart 8am-4pm, Monday-Friday.  *Please note: Calls after 4pm are forwarded to a third party answering service. Mychart messages are not routinely monitored during evenings, weekends, and holidays. Please call our office to contact the answering service for urgent concerns during non-business hours.   Additional Follow up appointments Please follow up with Drake Leach PA-C as scheduled in 2-3 weeks   Please see below for  scheduled appointments:  Future Appointments  Date Time Provider Department Center  10/19/2022  3:00 PM Drake Leach, PA-C [facility] None  11/16/2022  1:45 PM Lovenia Kim, MD [facility] None  12/08/2022  1:00 PM Margarita Mail, DO [facility]  12/26/2022  8:30 AM Drake Leach, PA-C [facility] None   AMBULATORY SURGERY  DISCHARGE INSTRUCTIONS   The drugs that you were given will stay in your system until tomorrow so for the next 24 hours you should not:  Drive an automobile Make any legal decisions Drink any alcoholic beverage   You may resume regular meals tomorrow.  Today it is better to start with liquids and gradually work up to solid foods.  You may eat anything you prefer, but it is better to start with liquids, then soup and crackers, and gradually work up to solid foods.   Please notify your doctor immediately if you have any unusual bleeding, trouble breathing, redness and pain at the surgery site, drainage, fever, or pain not relieved by medication.    Additional Instructions:  Please contact your physician with any problems or Same Day Surgery at [phone], Monday through Friday 6 am to 4 pm, or [place] at [facility] number at [phone].

## 2022-10-04 NOTE — Transfer of Care (Signed)
Immediate Anesthesia Transfer of Care Note  Patient: Scott Howell  Procedure(s) Performed: ULNAR NERVE TRANSPOSITION (Right) ANTERIOR INTEROSSEOUS NERVE TO ULNAR NERVE TRANSFER (Right) ULNAR NERVE STIMULATOR (Right)  Patient Location: PACU  Anesthesia Type:General  Level of Consciousness: drowsy  Airway & Oxygen Therapy: Patient Spontanous Breathing and Patient connected to face mask oxygen  Post-op Assessment: Report given to RN and Post -op Vital signs reviewed and stable  Post vital signs: Reviewed and stable  Last Vitals:  Vitals Value Taken Time  BP 134/79 10/04/22 1519  Temp 36 C 10/04/22 1519  Pulse 93 10/04/22 1520  Resp 29 10/04/22 1520  SpO2 100 % 10/04/22 1520  Vitals shown include unfiled device data.  Last Pain:  Vitals:   10/04/22 1519  TempSrc: Temporal  PainSc:       Patients Stated Pain Goal: 5 (10/04/22 1046)  Complications: No notable events documented.

## 2022-10-05 DIAGNOSIS — M792 Neuralgia and neuritis, unspecified: Secondary | ICD-10-CM

## 2022-10-05 DIAGNOSIS — R29898 Other symptoms and signs involving the musculoskeletal system: Secondary | ICD-10-CM

## 2022-10-05 DIAGNOSIS — G5621 Lesion of ulnar nerve, right upper limb: Secondary | ICD-10-CM

## 2022-10-05 NOTE — Anesthesia Postprocedure Evaluation (Signed)
Anesthesia Post Note  Patient: [name]  Procedure(s) Performed: ULNAR NERVE TRANSPOSITION (Right) ANTERIOR INTEROSSEOUS NERVE TO ULNAR NERVE TRANSFER (Right) ULNAR NERVE STIMULATOR (Right)  Patient location during evaluation: PACU Anesthesia Type: General Level of consciousness: awake and alert Pain management: pain level controlled Vital Signs Assessment: post-procedure vital signs reviewed and stable Respiratory status: spontaneous breathing, nonlabored ventilation and respiratory function stable Cardiovascular status: blood pressure returned to baseline and stable Postop Assessment: no apparent nausea or vomiting Anesthetic complications: no   No notable events documented.   Last Vitals:  Vitals:   10/04/22 1628 10/04/22 1655  BP: (!) 140/86 (!) 138/91  Pulse: 80 72  Resp: 16 17  Temp: (!) 36.1 C (!) 36.1 C  SpO2: 98% 98%    Last Pain:  Vitals:   10/05/22 0902  TempSrc:   PainSc: 0-No pain                 Foye Deer

## 2022-10-05 NOTE — Op Note (Signed)
Indications: 52 year old man with a history of with history of progressive right-sided ulnar neuropathy and hand weakness refractory to conservative management, had a previous decompression and has had progressive ulnar worsening.  Given his severe weakness in his ulnar distribution as well as severe pain we planned for a ulnar nerve decompression, AIN to ulnar nerve transfer, and placement of a peripheral nerve stimulator for pain control.  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   Preoperative Note: 52 year old man with a with a history of progressive right ulnar neuropathy and hand weakness refractory to conservative management.  They had tried rest, padding, and watchful waiting but had continued progressive symptoms.  He also had a previous ulnar nerve decompression transposition.  Given the progression of her ulnar neuropathy plan was made for ulnar nerve decompression/exploration, peripheral nerve stimulator placement, and AIN to ulnar nerve transfer  Risk of surgery is discussed and include: Infection, bleeding, wound healing issues, nerve injury, pain, failure to relieve the symptoms, need for further surgery.  Procedure:  1) ulnar nerve decompression 2) AIN to ulnar nerve transfer 3) ulnar nerve peripheral nerve stimulator placement   Procedure: After obtaining informed consent, the patient taken to the operating room, placed in supine position, monitored anesthesia care was induced.  They were given preoperative antibiotics.  Prepped and draped in the usual fashion.  Comprehensive timeout was performed verifying the patient's name, MRN, planned procedure.  The humerus  was padded, elbow was externally rotated, a curvilinear incision over the medial aspect of the elbow was planned.  This went proximal to the medial epicondyle as well as distal to cover the space from the cubital tunnel to the intermuscular septum/arcade of Struthers.  Local anesthetic was injected, skin was opened sharply, the skin was dissected down to the subcutaneous fascia.  Care was taken not to sacrifice any cutaneous nerves while doing this dissection.  We are able to feel and palpate the intermuscular septum.  Utilizing ultrasound we are able to identify the course of the nerve as it had been previously transposed.  This was where we are able to identify the nerve initially.  There were no areas of proximal compression left.  We did extend up superiorly to ensure that the arcade of Struthers had been released.  We then continued to follow the nerve distally decompressing on the way.  We found it in the retrocondylar groove, we removed all the soft tissue overlying.  We then continue to follow it distally.  We are able to identify the fascia overlying the heads of the FCU, there was some continued compression at the distal aspect of Osborne's band.  This was severely compressive.  We then divided this.  At this point the nerve was well decompressed from proximal to the arcade of Struthers to distal to Osborne's band.  There were no areas of ongoing compression.  At this point we turned our attention to placing the peripheral nerve stimulator.  We circumferentially isolated the nerve proximally and then used the tunneling device to tunnel it out to the anterior bicep.    We are then able to place the nerve stimulator directly on the ulnar nerve in the upper arm.  We tunneled the receiver superficially to  the level of the anterior bicep.  This was marked with a small point incision.  We then copiously irrigated.  The nerve stimulator was directly on the nerve prior to closure.  We then closed in  multiple layers.  Sterile dressing was applied.  At this point we then turned our attention to the nerve transfer.   An ultrasound was utilized to identify the proximal ridge of the pronator quadratus.  An incision was centered over this.  Is injected with local anesthetic.  The skin and subcutaneous tissues were opened sharply.  It was dissected and followed down to the investing fascia.  The investing fascia was opened.  We are able to identify the tendon of the FCU.  Just medial to this we are able to identify the ulnar neurovascular bundle.  Once we had this clearly identified we then turned our attention to identifying the anterior interosseous nerve.  We took the flexor bundle laterally and were able to identify the superficial aspect of the ulna.  Once we are able to identify this we retracted laterally and we are able to identify the proximal edge of the pronator quadratus.  We were able to identify the AIN neurovascular bundle at this point.  The AIN nerve was isolated circumferentially and dissected proximally.  It was stimulated to ensure its function which was present.  We then divided this distally.  We brought it up to the side of the ulnar nerve.  The ulnar nerve was dissected into its 3 separate bundles.  The epineurium of the motor branch was opened.  Once the epineurium was opened.  Epineurial sutures were used to perform an end to side coaptation of the AIN branch to the pronator quadratus to the ulnar nerve.  At this point this will apposed with no obvious signs of tension.  The wound was copiously irrigated.  Hemostasis was meticulous.  The wound was closed in layers.   No immediate complications.  Sponge and pattie counts were correct at the end of the procedure.   Manning Charity assisted in the procedure. An assistant was required for this procedure due to the complexity.  The assistant provided assistance in tissue manipulation and suction, and was required for the successful and  safe performance of the procedure. I performed the critical portions of the procedure.  Lovenia Kim, MD/MSCR

## 2022-10-06 ENCOUNTER — Encounter: Payer: Self-pay | Admitting: Neurosurgery

## 2022-10-07 ENCOUNTER — Encounter: Payer: Self-pay | Admitting: Neurosurgery

## 2022-10-07 ENCOUNTER — Telehealth: Payer: Self-pay

## 2022-10-07 NOTE — Telephone Encounter (Signed)
 I have faxed this to the pharmacy.

## 2022-10-07 NOTE — Telephone Encounter (Signed)
Oxycodone has been approved with [facility] and is valid from 10/06/22 to 10/13/22.

## 2022-10-12 ENCOUNTER — Telehealth: Payer: Self-pay | Admitting: Neurosurgery

## 2022-10-12 NOTE — Telephone Encounter (Signed)
I spoke with Scott Howell. I explained that it will take time for the tenderness and bruising to improve. I explained that the device is not usually turned on until 2 weeks after surgery and the rep will meet with him to turn it on and show him how to use it and provide the piece that he needs.  Per Dr Katrinka Blazing, OK to give 1 more refill of pain medicine. Pt would like the refill sent to [facility]. He states that the same provider that sent the previous rx needs to send the refill because he is in a "locked in program" with his insurance. I did explain that [name] is in surgery today and she may not be able to send the refill until late tonight or until tomorrow. He states he has 1 pill left that he will plan to take tonight.  He verbalized understanding of the above.

## 2022-10-12 NOTE — Telephone Encounter (Signed)
Patient has called stating he just had surgery last week and is supposed to follow up in 2 weeks with the provider, but never heard anything after - wants to know if he still has to follow up with Dr.Smith or can he just discuss during his appointment on 9.4.24 with Drake Leach.   Patient also states he is out of pain medication. He said he has something in his arm that is supposed to help with pain but it is missing a piece that he is supposed to get from the provider. Patient is still having pain and states his arm is very tender and blue. Wanting a refill on pain medication and wanting to know what to do about the piece in his arm; please advise

## 2022-10-13 ENCOUNTER — Ambulatory Visit: Payer: MEDICAID | Admitting: Orthopedic Surgery

## 2022-10-13 ENCOUNTER — Other Ambulatory Visit: Payer: Self-pay | Admitting: Neurosurgery

## 2022-10-13 MED ORDER — OXYCODONE HCL 5 MG PO TABS: 5.0000 mg | ORAL_TABLET | Freq: Three times a day (TID) | ORAL | 0 refills | Status: AC | PRN

## 2022-10-13 NOTE — Telephone Encounter (Signed)
[phone], patient calling to check on his pain medication refill. Any update?

## 2022-10-13 NOTE — Telephone Encounter (Signed)
Patient aware of med refill.

## 2022-10-14 NOTE — Progress Notes (Unsigned)
REFERRING PHYSICIAN:  Margarita Mail, Drucilla Schmidt [address] [place],  [place] [address]  DOS: 10/04/22  Ulnar nerve decompression, AIN to ulnar nerve transfer, and ulnar nerve peripheral nerve stimulator placement  HISTORY OF PRESENT ILLNESS: Scott Howell is 2 weeks status post Ulnar nerve decompression, AIN to ulnar nerve transfer, and ulnar nerve peripheral nerve stimulator placement. Given oxycodone on discharge from the hospital.   He is also here to review recent thoracic MRI scan that I ordered at his last visit. He was sent to PMR to discuss cervical and lumbar injections. He was referred to [facility] for left C7-T1 IL ESI.   He is doing well after his surgery. He has been doing HEP he got from after his last surgery. He continues with numbness and tingling.   As for his neck, he continues with intermittent neck pain that radiates to left scapular region. No arm pain. He has numbness, tingling, weakness in right hand as above.    PHYSICAL EXAMINATION:  NEUROLOGICAL:  General: In no acute distress.   Awake, alert, oriented to person, place, and time.  Pupils equal round and reactive to light.  Facial tone is symmetric.    Incisions right arm c/d/I  He is almost able to fully extend his fingers, he has good flexion/extension of wrist with no pain. Good grip strength. He continues with diminished sensation in ulnar nerve distribution.     Imaging:  Thoracic MRI scan dated 09/09/22:  FINDINGS: Alignment:  Physiologic.   Vertebrae: No fracture, evidence of discitis, or bone lesion.   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 Cord signal is otherwise normal.   Paraspinal and other soft tissues: Negative.   Disc levels:   T1-T2: Bilateral subarticular disc protrusions with effacement of the ventral thecal sac. Mild spinal canal stenosis. Mild bilateral neuroforaminal stenosis.   T2-T3: Small left subarticular disc protrusion. Mild  left neuroforaminal stenosis. No spinal canal or right neuroforaminal stenosis.   T3-T4: Small left subarticular disc protrusion. Right foraminal endplate spurring. No stenosis.   T4-T5: Small right subarticular disc protrusion. Mild bilateral facet arthropathy. No stenosis.   T5-T6: Small shallow right paracentral and subarticular disc protrusion with annular fissure. This contacts and slightly flattens the ventral cord. No spinal canal or neuroforaminal stenosis.   T6-T7: Small shallow right paracentral disc protrusion with annular fissure contacting right ventral cord. No stenosis.   T7-T8: Mild disc degeneration without stenosis.   T8-T9: Mild disc degeneration without stenosis.   T9-T10: Mild disc degeneration without stenosis.   T10-T11: Mild disc bulging.  No stenosis.   T11-T12: Mild disc degeneration without stenosis.   IMPRESSION: 1. 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 No high-grade stenosis or impingement. 2. 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     Electronically Signed   By: Obie Dredge M.D.   On: 09/19/2022 14:03  I have personally reviewed the images and agree with the above interpretation.   Imaging reviewed with Dr. Myer Haff prior to his visit including his previous cervical MRI.   Assessment / Plan: Scott Howell is doing well s/p above surgery. Treatment options reviewed with patient and following plan made:   - Rep was in to see him regarding reprogramming.  - Hold on formal OT for now.  Okay to continue with HEP per Dr. Katrinka Blazing.   - Reviewed wound care.  - Follow up as scheduled with  Dr. Katrinka Blazing in 4 weeks and prn.   As for his neck, Dr. Myer Haff recommends he recover from above surgery prior to considering any surgical intervention for his neck. He also would recommend trial of PT/dry needling prior to consideration of further surgery. PMR has ordered cervical ESI for  him, he is going to hold on this until he recovers as well. Thoracic MRI looks okay and would not explain his chronic balance issues.   He will follow up with [place] for his neck once he is recovered from above surgery.   Advised to contact the office if any questions or concerns arise.  I spent a total of 15 minutes in face-to-face and non-face-to-face activities related to this patient's care today regarding his cervical and thoracic spine including review of outside records, review of imaging, review of symptoms, physical exam, discussion of differential diagnosis, discussion of treatment options, and documentation.   Drake Leach PA-C Dept of Neurosurgery

## 2022-10-19 ENCOUNTER — Ambulatory Visit (INDEPENDENT_AMBULATORY_CARE_PROVIDER_SITE_OTHER): Payer: MEDICAID | Admitting: Orthopedic Surgery

## 2022-10-19 ENCOUNTER — Encounter: Payer: Self-pay | Admitting: Orthopedic Surgery

## 2022-10-19 VITALS — BP 128/84 | Ht 73.0 in | Wt 232.0 lb

## 2022-10-19 DIAGNOSIS — Z981 Arthrodesis status: Secondary | ICD-10-CM

## 2022-10-19 DIAGNOSIS — M47812 Spondylosis without myelopathy or radiculopathy, cervical region: Secondary | ICD-10-CM

## 2022-10-19 DIAGNOSIS — G5621 Lesion of ulnar nerve, right upper limb: Secondary | ICD-10-CM

## 2022-10-19 DIAGNOSIS — R2689 Other abnormalities of gait and mobility: Secondary | ICD-10-CM

## 2022-10-19 DIAGNOSIS — Z09 Encounter for follow-up examination after completed treatment for conditions other than malignant neoplasm: Secondary | ICD-10-CM

## 2022-10-20 ENCOUNTER — Telehealth: Payer: Self-pay | Admitting: Neurosurgery

## 2022-10-20 NOTE — Telephone Encounter (Signed)
DOS: 10/04/22  Ulnar nerve decompression, AIN to ulnar nerve transfer, and ulnar nerve peripheral nerve stimulator placement   Please let him know I will review this with Dr. Katrinka Blazing and let him know tomorrow. Can I do the refill or does it have to be Danielle?

## 2022-10-20 NOTE — Telephone Encounter (Signed)
ulnar nerve transposition, anterior interosseous nerve to ulnar nerve transfer, ulnar nerve stimulator on 10/04/22   [facility] O[facility]one 5mg  every 6 hours He has enough for today

## 2022-10-20 NOTE — Telephone Encounter (Signed)
I notified Mr Twiddy that we are waiting on a response from Dr Katrinka Blazing and that he is still in the OR. He confirmed that it has to be the same provider as last time (per a requirement from his insurance), so it would have to be [facility].

## 2022-10-21 ENCOUNTER — Other Ambulatory Visit: Payer: Self-pay | Admitting: Neurosurgery

## 2022-10-21 MED ORDER — OXYCODONE HCL 5 MG PO CAPS: 5.0000 mg | ORAL_CAPSULE | Freq: Two times a day (BID) | ORAL | 0 refills | Status: AC | PRN

## 2022-10-21 NOTE — Telephone Encounter (Signed)
Patient notified of refill and he does not have a pain physician. He will stretch this refill out.

## 2022-10-21 NOTE — Telephone Encounter (Signed)
Per Dr. Katrinka Blazing can do one last refill of oxycodne (would do bid x 5 days) and then he needs to get further prescriptions from his pain physician.   Per patient, script must be written by [name].

## 2022-11-16 ENCOUNTER — Ambulatory Visit (INDEPENDENT_AMBULATORY_CARE_PROVIDER_SITE_OTHER): Payer: MEDICAID | Admitting: Neurosurgery

## 2022-11-16 ENCOUNTER — Encounter: Payer: MEDICAID | Admitting: Neurosurgery

## 2022-11-16 ENCOUNTER — Encounter: Payer: Self-pay | Admitting: Neurosurgery

## 2022-11-16 VITALS — BP 142/88 | Ht 73.0 in | Wt 232.0 lb

## 2022-11-16 DIAGNOSIS — G5621 Lesion of ulnar nerve, right upper limb: Secondary | ICD-10-CM

## 2022-11-16 DIAGNOSIS — Z09 Encounter for follow-up examination after completed treatment for conditions other than malignant neoplasm: Secondary | ICD-10-CM

## 2022-11-16 NOTE — Patient Instructions (Signed)
LOCAL PHYSICAL THERAPY  [facility]  [address]  [place], [place] [address]  [phone]  [facility]  [address] [place], [place] [address]  [phone]  [facility] (2 locations)  [address]  [place], [place] [address]  [phone]          or  [address]  [place], [place] [address]  [phone]  [facility]  [address]  [place], [place] [address]  [phone]  **dry needling**  [facility] [place]  [address]  [place], [place] [address]  [phone]  Fax: [phone]  ** Aquatic therapy[address] [place], [place] [address] [phone] **Aquatic therapy**  [facility]  [facility]  [address]  [place], [place] [address]  [phone]  [facility]  [address]  [place], [place] [address]  [phone]  [facility]  [address]   Janora Norlander  [place], [place] [address]  [phone]  [facility]  [address]  [place], [place] [address]  [phone]  **dry needling**   PELVIC FLOOR/SI JOINT  [facility]  Mariane Masters, PT  [email or web address]   [place]  [facility]  [address]  [address] [place], [place] [address]  [phone]   [facility] [place]  [address], [place] [address]  [phone]   [facility], [place]  [facility]  Raymond Gurney, PT  [address]  [place], [place] [address] [phone]   Samara Deist  [facility]  [address]  [phone]   [facility]  [address]  [phone]   [facility]  Annamaria Boots [facility]  [address]  [place], [place] [address]  [phone]   **No Aetna or medicaid**  [facility]  [phone] [address], [place] [address]  [phone]  [facility]  [address]  [place], [place] [address]  [phone]  Dannial Monarch, [place]  [address], [place] [address]  [phone]  [facility]  [address], [place] [address]  [phone]  [facility]  [address]  [place], [place] [address]  [phone]  [facility]  (3 locations)  [address]  [place], [place] [address]  [phone]  **dry needling**           or  [address] [place], [place] [address]  [phone]  **Additionally has Gloris Manchester, OT**           or  [address]  [place], [place] [address]  [phone]  **Pediatric therapy**  [facility]  [address]  [phone]  **dry needlingVerdie Drown [facility]  [address]  [place], [place] [address]  [phone]  [facility]   (Inside [address] Fitness)  [address]  [place], [place] [address]  [phone]  **dry needling**  **MEDICAID or UNINSURED** [facility] dept. Of Physical Therapy [place], [place] [address] [phone]  [facility]  [address] [place], [place] [address]  [phone]   [facility]  [address]  [place], [place] [address]  [phone]   Doreatha Martin  [facility]  [address] [place], [place] [address]  [phone]   [facility]  [address]  [place], [place] [address]  [phone]   [facility]  [address] [place], [place] [address]  [phone]  [facility]  [address]  [place], [place] [address]  [phone]  AQUATIC  Kathalene Frames [facility]  [place] [facility]  [facility]  [place]  [place]  *Residents only*   [facility] [employer]  *[facility]  Exercise class  [facility]  Exercise class  [facility]  [address]  [place], [place]   [phone]  [facility]  [address]  [place], [place] [address]  [phone]   [place], [place]  [facility]  [address]  [phone]   [facility]  [facility]  [address]  [phone]           or  [address]  [phone]   [facility]   Provides education and support and practical information for coping with arthritis for arthritis sufferers and their families.   When: 12:15 - 1:30 p.m. the second Monday of each month, March through December  Info: Call Rehabilitation Services at [phone]

## 2022-11-16 NOTE — Progress Notes (Signed)
   REFERRING PHYSICIAN:  Margarita Mail, Drucilla Schmidt [address] [place],  [place] [address]  DOS: 10/04/22  Ulnar nerve decompression, AIN to ulnar nerve transfer, and ulnar nerve peripheral nerve stimulator placement  HISTORY OF PRESENT ILLNESS: Scott Howell is 2 weeks status post Ulnar nerve decompression, AIN to ulnar nerve transfer, and ulnar nerve peripheral nerve stimulator placement. Given oxycodone on discharge from the hospital.   He is following up today, states that his pain has gotten significantly better, however he does not like how it feels when he has a stimulator on.  [place] Spaniel that it was causing him some difficulty with his mental health so he stopped utilizing stimulation.  PHYSICAL EXAMINATION:  NEUROLOGICAL:  General: In no acute distress.   Awake, alert, oriented to person, place, and time.  Pupils equal round and reactive to light.  Facial tone is symmetric.    Incisions right arm c/d/I  He continues to have severe weakness in his ulnar innervated musculature.  Assessment / Plan: Scott Howell is doing well s/p above surgery.  We removed an extruded stitch at this appointment.  He does have some improvement in his motor examination but continues to have severe weakness.  He would like to work with occupational therapy which we placed an order for.  He stated that he does not like the way the nerve stimulator feels 1+ and he has discontinued use.  We had a discussion about removal I let him know that given its proximity to the nerve that there would be a slight risk of him having a nerve issue after removal as this is likely scarred into its place and would need to be dissected off of the nerve.  He is opted to just keep it in place and not utilize it which we feel is a good plan as well.  He can continue to follow-up as needed.  Orders for OT have been placed.   Lovenia Kim, MD [facility]

## 2022-11-18 ENCOUNTER — Other Ambulatory Visit: Payer: Self-pay | Admitting: Internal Medicine

## 2022-11-18 DIAGNOSIS — I10 Essential (primary) hypertension: Secondary | ICD-10-CM

## 2022-11-18 DIAGNOSIS — E7800 Pure hypercholesterolemia, unspecified: Secondary | ICD-10-CM

## 2022-11-18 NOTE — Telephone Encounter (Signed)
Requested Prescriptions  Pending Prescriptions Disp Refills   lisinopril (ZESTRIL) 10 MG tablet [Pharmacy Med Name: LISINOPRIL 10 MG TABLET] 90 tablet 0    Sig: TAKE 1 TABLET BY MOUTH EVERY DAY     Cardiovascular:  ACE Inhibitors Failed - 11/18/2022  7:02 AM      Failed - Last BP in normal range    BP Readings from Last 1 Encounters:  11/16/22 (!) 142/88         Passed - Cr in normal range and within 180 days    Creat  Date Value Ref Range Status  06/08/2022 0.87 0.70 - 1.30 mg/dL Final         Passed - K in normal range and within 180 days    Potassium  Date Value Ref Range Status  06/08/2022 4.9 3.5 - 5.3 mmol/L Final  06/08/2014 3.7 mmol/L Final    Comment:    3.5-5.1 NOTE: New Reference Range  04/22/14          Passed - Patient is not pregnant      Passed - Valid encounter within last 6 months    Recent Outpatient Visits           5 months ago Hypertension goal BP (blood pressure) < 140/90   [facility] Margarita Mail, DO   11 months ago Hypertension goal BP (blood pressure) < 140/90   [facility] Margarita Mail, DO   1 year ago COVID-19   [facility] Margarita Mail, DO   1 year ago Trauma   [facility] Margarita Mail, DO   1 year ago Hypertension goal BP (blood pressure) < 140/90   [facility] Margarita Mail, DO       Future Appointments             In 2 weeks Margarita Mail, DO [facility]             atorvastatin (LIPITOR) 40 MG tablet [Pharmacy Med Name: ATORVASTATIN 40 MG TABLET] 90 tablet 1    Sig: TAKE 1 TABLET BY MOUTH EVERY DAY     Cardiovascular:  Antilipid - Statins Failed - 11/18/2022  7:02 AM      Failed - Lipid Panel in normal range within the last 12 months    Cholesterol  Date Value Ref Range Status  06/08/2022 176 <200 mg/dL Final   LDL  Cholesterol (Calc)  Date Value Ref Range Status  06/08/2022 108 (H) mg/dL (calc) Final    Comment:    Reference range: <100 . Desirable range <100 mg/dL for primary prevention;   <70 mg/dL for patients with CHD or diabetic patients  with > or = 2 CHD risk factors. Marland Kitchen LDL-C is now calculated using the Martin-Hopkins  calculation, which is a validated novel method providing  better accuracy than the Friedewald equation in the  estimation of LDL-C.  Horald Pollen et al. Lenox Ahr. 8756;433(29): 2061-2068  (http://education.[employer].com/faq/FAQ164)    HDL  Date Value Ref Range Status  06/08/2022 38 (L) > OR = 40 mg/dL Final   Triglycerides  Date Value Ref Range Status  06/08/2022 181 (H) <150 mg/dL Final         Passed - Patient is not pregnant      Passed - Valid encounter within last 12 months    Recent Outpatient Visits           5  months ago Hypertension goal BP (blood pressure) < 140/90   [facility] Margarita Mail, [place]   11 months ago Hypertension goal BP (blood pressure) < 140/90   [facility] Margarita Mail, DO   1 year ago COVID-19   [facility] Margarita Mail, DO   1 year ago Trauma   [facility] Margarita Mail, DO   1 year ago Hypertension goal BP (blood pressure) < 140/90   [facility] Margarita Mail, DO       Future Appointments             In 2 weeks Margarita Mail, DO [facility], [facility]

## 2022-11-21 ENCOUNTER — Ambulatory Visit: Payer: MEDICAID | Attending: Neurosurgery

## 2022-11-21 DIAGNOSIS — M6281 Muscle weakness (generalized): Secondary | ICD-10-CM | POA: Diagnosis present

## 2022-11-21 DIAGNOSIS — R278 Other lack of coordination: Secondary | ICD-10-CM | POA: Insufficient documentation

## 2022-11-21 DIAGNOSIS — M25631 Stiffness of right wrist, not elsewhere classified: Secondary | ICD-10-CM | POA: Diagnosis present

## 2022-11-21 DIAGNOSIS — G5621 Lesion of ulnar nerve, right upper limb: Secondary | ICD-10-CM | POA: Diagnosis present

## 2022-11-21 DIAGNOSIS — M25641 Stiffness of right hand, not elsewhere classified: Secondary | ICD-10-CM | POA: Insufficient documentation

## 2022-11-21 NOTE — Therapy (Unsigned)
OUTPATIENT OCCUPATIONAL THERAPY ORTHO EVALUATION  Patient Name: Scott Howell MRN: [id number] DOB:[birth year], 52 y.o., male Today's Date: 11/22/2022  PCP: Dr. Margarita Mail REFERRING PROVIDER: Dr. Ernestine Mcmurray   END OF SESSION:  OT End of Session - 11/21/22 1318     Visit Number 1    Number of Visits 24    Date for OT Re-Evaluation 02/13/23    Progress Note Due on Visit 10    OT Start Time 1315    OT Stop Time 1410    OT Time Calculation (min) 55 min    Activity Tolerance Patient tolerated treatment well    Behavior During Therapy WFL for tasks assessed/performed             Past Medical History:  Diagnosis Date   Adenomatous polyp of colon    Arthritis    Asthma    Benign fibroma of prostate    Bipolar 1 disorder (HCC)    Cervical myelopathy (HCC)    Cervical post-laminectomy syndrome    Cervical spinal cord compression (HCC)    Chronic knee pain    Chronic pain associated with significant psychosocial dysfunction    Chronic prescription benzodiazepine use    Constipation    COPD (chronic obstructive pulmonary disease) (HCC)    Decreased motor strength    Drug abuse, opioid type (HCC)    Dyspnea    Elevated liver enzymes    Erectile dysfunction    Fibromyalgia    GERD (gastroesophageal reflux disease)    H/O ETOH abuse    Headache    High cholesterol    History of 2019 novel coronavirus disease (COVID-19) 11/01/2021   History of transient cerebral ischemia    Homicidal ideation    Hypertension    Hypokalemia    Living accommodation issues    a.) as of 03/29/2021 --> living in camper   Lower back pain    Lumbar canal stenosis    Marijuana use    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   Pneumonia    Presence of permanent cardiac pacemaker    S/P TKR (total knee replacement)    Schizophrenia (HCC)    Seizures (HCC) 11/2020   Status post hardware removal    Stroke (HCC)    no deficits   Tobacco use    Past Surgical History:   Procedure Laterality Date   ANTERIOR CERVICAL DECOMP/DISCECTOMY FUSION N/A 01/13/2021   Procedure: C3-5 ANTERIOR CERVICAL DECOMPRESSION/DISCECTOMY FUSION;  Surgeon: Venetia Night, MD;  Location: [facility] ORS;  Service: Neurosurgery;  Laterality: N/A;   CERVICAL SPINE SURGERY     4   CLAVICLE SURGERY     COLONOSCOPY WITH PROPOFOL N/A 06/29/2022   Procedure: COLONOSCOPY WITH PROPOFOL;  Surgeon: Wyline Mood, MD;  Location: [facility] ENDOSCOPY;  Service: Gastroenterology;  Laterality: N/A;  USES MED TRANS; WILL NEED TIME ON FRIDAY, 06/24/2022   HARDWARE REMOVAL Left 04/20/2015   Procedure: HARDWARE REMOVAL left leg;  Surgeon: Deeann Saint, MD;  Location: [facility] ORS;  Service: Orthopedics;  Laterality: Left;   KNEE SURGERY Left    NECK SURGERY     x6   SHOULDER SURGERY Left    TOTAL KNEE ARTHROPLASTY Left 04/12/2021   Procedure: TOTAL KNEE ARTHROPLASTY;  Surgeon: Lyndle Herrlich, MD;  Location: [facility] ORS;  Service: Orthopedics;  Laterality: Left;   ULNAR NERVE TRANSPOSITION Right 11/17/2021   Procedure: SUBCUTANEOUS TRANSPOSITION OF ULNAR NERVE, RIGHT ELBOW;  Surgeon: Christena Flake, MD;  Location: [facility]  ORS;  Service: Orthopedics;  Laterality: Right;   ULNAR NERVE TRANSPOSITION Right 10/04/2022   Procedure: ULNAR NERVE TRANSPOSITION;  Surgeon: Lovenia Kim, MD;  Location: [facility] ORS;  Service: Neurosurgery;  Laterality: Right;  NO BLOCK   Patient Active Problem List   Diagnosis Date Noted   Ulnar neuropathy of right upper extremity 10/05/2022   Hand weakness 10/05/2022   Nerve pain 10/05/2022   Adenomatous polyp of colon 06/29/2022   S/P TKR (total knee replacement) using cement, left 04/12/2021   Cervical myelopathy (HCC) 01/13/2021   Seizure (HCC) 11/26/2020   Abnormal thyroid blood test 04/06/2016   Encounter for screening colonoscopy 01/05/2016   Abnormal weight gain 01/05/2016   Chronic prescription benzodiazepine use 10/19/2015   Elevated liver enzymes 08/20/2015   Medication monitoring  encounter 08/20/2015   Hypokalemia 08/20/2015   Erectile dysfunction 04/22/2015   Status post hardware removal 04/20/2015   Bipolar 2 disorder (HCC) 07/29/2014   Marijuana abuse 07/29/2014   Chronic knee pain 07/29/2014   Bipolar 1 disorder, mixed (HCC)    Drug abuse, opioid type (HCC) 06/28/2013   GERD without esophagitis 10/03/2012   Hypertension goal BP (blood pressure) < 140/90 10/03/2012   Chronic pain associated with significant psychosocial dysfunction 07/17/2012   Lumbar canal stenosis 04/09/2012   Hypercholesterolemia 01/06/2012   Constipation 11/11/2011   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/2010   Decreased motor strength 09/30/2010   Current tobacco use 09/30/2010   H/O transient cerebral ischemia 09/09/2010   Low back pain 08/30/2010   Cervical spinal cord compression (HCC) 07/22/2010   Cervical post-laminectomy syndrome 07/22/2010   Chronic obstructive pulmonary disease (HCC) 02/23/2010    ONSET DATE: 10/04/22 (most recent sx)  REFERRING DIAG: G56.21 (ICD-10-CM) - Ulnar neuropathy of right upper extremity   THERAPY DIAG:  Muscle weakness (generalized)  Other lack of coordination  Ulnar neuropathy at elbow of right upper extremity  Rationale for Evaluation and Treatment: Rehabilitation  SUBJECTIVE:  SUBJECTIVE STATEMENT: Pt reports he needs to build up his strength in his hand and forearm after surgery. Pt accompanied by: self  PERTINENT HISTORY:  Per note from Dr. Katrinka Blazing on 11/16/22: Scott Howell is 2 weeks status post Ulnar nerve decompression, AIN to ulnar nerve transfer, and ulnar nerve peripheral nerve stimulator placement. Given oxycodone on discharge from the hospital.   Per medical record, pt underwent first ulnar nerve transposition on 11/17/21 and participated in OT post surgery.  2nd ulnar nerve transposition done on 10/04/22.  Pt had nerve stimulator placed but pt reports he does not like how it feels so he does not turn it on.  Pt also with hx of 6  cervical spinal surgeries; see above for additional hx.    PRECAUTIONS: None  RED FLAGS: None   WEIGHT BEARING RESTRICTIONS: No  PAIN:  Are you having pain? Yes: NPRS scale: 2/10 at rest, with activity 6-7/10 Pain location: ulnar side of R arm extending from elbow to hand Pain description: pins and needles, occasional spasms, numbness Aggravating factors: "my arm doesn't like the cold, increased activity  Relieving factors: heat, rest, Gabapentin  LIVING ENVIRONMENT: Lives with: alone with 2 dogs  PLOF: Independent, works as a Scientist, water quality  PATIENT GOALS: build up the strength in the R arm and hand  NEXT MD VISIT: Nov 1st to Dr. Katrinka Blazing  OBJECTIVE:  Note: Objective measures were completed at Evaluation unless otherwise noted.  HAND DOMINANCE: Right  ADLs: Overall ADLs: Pt reports he always tries to use the R  arm as able, but in a limited capacity, and is unable to use the R hand with good accuracy or efficiency. Transfers/ambulation related to ADLs: indep Eating: gross grasp in R hand to hold utensils, difficulty cutting food  Grooming: uses L non-dominant hand to shave  UB Dressing: difficulty with clothing fasteners LB Dressing: difficulty with clothing fasteners, extra time to tie shoe laces  Toileting: uses L non-dominant hand for toilet hygiene Bathing: Pt states that he makes himself use the R hand when bathing but control is limited  FUNCTIONAL OUTCOME MEASURES: FOTO: 50; predicted with 56  UPPER EXTREMITY ROM:     Active ROM Right eval Left eval  Shoulder flexion    Shoulder abduction    Shoulder adduction    Shoulder extension    Shoulder internal rotation    Shoulder external rotation    Elbow flexion    Elbow extension 58 65  Wrist flexion    Wrist extension    Wrist ulnar deviation -30 WNL  Wrist radial deviation WNL WNL  Wrist pronation 90 90  Wrist supination 70 75  (Blank rows = not tested)  Active ROM Right eval Left eval  Thumb MCP  (0-60)    Thumb IP (0-80)    Thumb Radial abd/add (0-55)     Thumb Palmar abd/add (0-45)     Thumb Opposition to Small Finger     Index MCP (0-90)     Index PIP (0-100)     Index DIP (0-70)      Long MCP (0-90)      Long PIP (0-100)      Long DIP (0-70)      Ring MCP (0-90)      Ring PIP (0-100)      Ring DIP (0-70)      Little MCP (0-90)      Little PIP (0-100)      Little DIP (0-70)      (Blank rows = not tested)  -R hand digits limited in PIP extension in 4th and 5th digits with neutral wrist (lacking ~25% ext in 4th digit PIP and 50% ext in 5th digit PIP) -limited active digit abd in 4th and 5th digits  -Unable to oppose 4th and 5th digits to thumb   UPPER EXTREMITY MMT:     MMT Right eval Left eval  Shoulder flexion    Shoulder abduction    Shoulder adduction    Shoulder extension    Shoulder internal rotation    Shoulder external rotation    Middle trapezius    Lower trapezius    Elbow flexion 5 5  Elbow extension 5 5  Wrist flexion 4 5  Wrist extension 4+ 5  Wrist ulnar deviation 1 5  Wrist radial deviation 5 5  Wrist pronation 5 5  Wrist supination 5 5  (Blank rows = not tested)  HAND FUNCTION: Grip strength: Right: 26 lbs; Left: 95 lbs, Lateral pinch: Right: 7 lbs, Left: 32 lbs, and 3 point pinch: Right: 4 lbs, Left: 27 lbs  COORDINATION: 9 Hole Peg test: Right: 2 min 39 sec; Left: 26 sec  SENSATION: Light touch: Impaired ; ulnar nerve distribution  EDEMA: very mild edema surrounding incision sites on R forearm and elbow   COGNITION: Overall cognitive status: Within functional limits for tasks assessed Areas of impairment:  hx of bipolar 1, schizophrenia, and drug and alcohol abuse  OBSERVATIONS:  Pt pleasant, cooperative, and eager to regain strength in his R dominant arm.  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   Note for education/handouts: Pt reports he is limited with his reading and writing  ability, but states he will always ask if he doesn't understand something.  TODAY'S TREATMENT:                                                                                                                              DATE: 11/21/22 Evaluation completed.   PATIENT EDUCATION: Education details: OT role, goals, poc Person educated: Patient Education method: Explanation Education comprehension: verbalized understanding  HOME EXERCISE PROGRAM: To be initiated next session  GOALS: Goals reviewed with patient? Yes  SHORT TERM GOALS: Target date: 01/02/23  Pt will be indep to perform HEP for improving distal RUE flexibility, strength, and coordination. Baseline: Eval: Not yet initiated Goal status: INITIAL  LONG TERM GOALS: Target date: 02/13/23  Pt will increase FOTO score to 56 or better to indicate improvement in self perceived functional use of the R arm with daily tasks. Baseline: Eval: 50 Goal status: INITIAL  2.  Pt will increase R grip strength by 10 or more lbs to ease ability carry heavy grocery bags in R dominant hand. Baseline: Eval: R grip 26 lbs (non-dominant L 95 lbs) Goal status: INITIAL  3.  Pt will increase R lateral pinch strength by 5 or more lbs to ease ability to open drink bottles. Baseline: Eval: R 7 lbs (non-dominant L 32 lbs) Goal status: INITIAL  4.  Pt will tolerate manual therapy, therapeutic modalities, and exercises to decrease pain in RUE to a reported 4/10 pain or less with activity.   Baseline: Eval: R forearm and hand 6-7/10 pain with activity  Goal status: INITIAL  5.  Pt will increase R hand FMC/dexterity skills to improve efficiency with clothing fasteners as noted by completion of 9 hole peg test in <2 min.  Baseline: Eval: 2 min 39 sec; extra time clothing fasteners, specifically buttons and tying shoe laces. Goal status: INITIAL  ASSESSMENT:  CLINICAL IMPRESSION: Patient is a 52 y.o. male who was seen today for occupational therapy  evaluation for functional deficits in the RUE following R ulnar nerve transposition x2.  First sx done on 11/18/22, and a 2nd done on 10/04/22.  Pt reports R hand function has improved slightly from first sx, but pt remains with very limited hand strength, coordination, and moderate to severe pain in the R hand with activity.  Pt presents with significant paresthesias and weakness in the ulnar nerve distribution of the R arm, extending from elbow to hand, noting limited PIP ext of 4th and 5th digits, and -30 degrees of ulnar deviation in the R wrist.  Pt verbalizes goal of building strength in R forearm, wrist, and hand as he works as a Scientist, water quality.  Pt will benefit from skilled OT to address above noted deficits, in order to work towards increased functional use of  the R arm for ADL/IADL/work tasks.  Pt in agreement with plan.   PERFORMANCE DEFICITS: in functional skills including ADLs, IADLs, coordination, dexterity, sensation, edema, ROM, strength, pain, fascial restrictions, muscle spasms, flexibility, Fine motor control, body mechanics, decreased knowledge of use of DME, skin integrity, and UE functional use, and psychosocial skills including coping strategies, environmental adaptation, habits, and routines and behaviors.   IMPAIRMENTS: are limiting patient from ADLs, IADLs, rest and sleep, work, and leisure.   COMORBIDITIES: has co-morbidities such as hx of 6 cervical spinal surgeries, bipolar 1, schizophrenia  that affects occupational performance. Patient will benefit from skilled OT to address above impairments and improve overall function.  MODIFICATION OR ASSISTANCE TO COMPLETE EVALUATION: No modification of tasks or assist necessary to complete an evaluation.  OT OCCUPATIONAL PROFILE AND HISTORY: Problem focused assessment: Including review of records relating to presenting problem.  CLINICAL DECISION MAKING: Moderate - several treatment options, min-mod task modification necessary  REHAB  POTENTIAL: Good  EVALUATION COMPLEXITY: Moderate      PLAN:  OT FREQUENCY: 2x/week  OT DURATION: 12 weeks  PLANNED INTERVENTIONS: self care/ADL training, therapeutic exercise, therapeutic activity, neuromuscular re-education, manual therapy, scar mobilization, passive range of motion, splinting, electrical stimulation, paraffin, moist heat, cryotherapy, contrast bath, patient/family education, cognitive remediation/compensation, psychosocial skills training, coping strategies training, and DME and/or AE instructions  RECOMMENDED OTHER SERVICES: None at this time  CONSULTED AND AGREED WITH PLAN OF CARE: Patient  PLAN FOR NEXT SESSION: see above   Scott Earthly, MS, OTR/L  Scott Howell, OT 11/22/2022, 8:30 AM

## 2022-11-24 ENCOUNTER — Ambulatory Visit: Payer: MEDICAID

## 2022-11-24 DIAGNOSIS — M6281 Muscle weakness (generalized): Secondary | ICD-10-CM | POA: Diagnosis not present

## 2022-11-24 DIAGNOSIS — R278 Other lack of coordination: Secondary | ICD-10-CM

## 2022-11-24 DIAGNOSIS — G5621 Lesion of ulnar nerve, right upper limb: Secondary | ICD-10-CM

## 2022-11-24 NOTE — Therapy (Signed)
OUTPATIENT OCCUPATIONAL THERAPY ORTHO TREATMENT NOTE  Patient Name: Scott Howell MRN: [id number] DOB:1971/01/30, 52 y.o., male Today's Date: 11/24/2022  PCP: Dr. Margarita Mail REFERRING PROVIDER: Dr. Ernestine Mcmurray   END OF SESSION:  OT End of Session - 11/24/22 1602     Visit Number 2    Number of Visits 24    Date for OT Re-Evaluation 02/13/23    Progress Note Due on Visit 10    OT Start Time 1145    OT Stop Time 1230    OT Time Calculation (min) 45 min    Activity Tolerance Patient tolerated treatment well    Behavior During Therapy WFL for tasks assessed/performed            Past Medical History:  Diagnosis Date   Adenomatous polyp of colon    Arthritis    Asthma    Benign fibroma of prostate    Bipolar 1 disorder (HCC)    Cervical myelopathy (HCC)    Cervical post-laminectomy syndrome    Cervical spinal cord compression (HCC)    Chronic knee pain    Chronic pain associated with significant psychosocial dysfunction    Chronic prescription benzodiazepine use    Constipation    COPD (chronic obstructive pulmonary disease) (HCC)    Decreased motor strength    Drug abuse, opioid type (HCC)    Dyspnea    Elevated liver enzymes    Erectile dysfunction    Fibromyalgia    GERD (gastroesophageal reflux disease)    H/O ETOH abuse    Headache    High cholesterol    History of 2019 novel coronavirus disease (COVID-19) 11/01/2021   History of transient cerebral ischemia    Homicidal ideation    Hypertension    Hypokalemia    Living accommodation issues    a.) as of 03/29/2021 --> living in camper   Lower back pain    Lumbar canal stenosis    Marijuana use    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   Pneumonia    Presence of permanent cardiac pacemaker    S/P TKR (total knee replacement)    Schizophrenia (HCC)    Seizures (HCC) 11/2020   Status post hardware removal    Stroke (HCC)    no deficits   Tobacco use    Past Surgical History:   Procedure Laterality Date   ANTERIOR CERVICAL DECOMP/DISCECTOMY FUSION N/A 01/13/2021   Procedure: C3-5 ANTERIOR CERVICAL DECOMPRESSION/DISCECTOMY FUSION;  Surgeon: Venetia Night, MD;  Location: [facility] ORS;  Service: Neurosurgery;  Laterality: N/A;   CERVICAL SPINE SURGERY     4   CLAVICLE SURGERY     COLONOSCOPY WITH PROPOFOL N/A 06/29/2022   Procedure: COLONOSCOPY WITH PROPOFOL;  Surgeon: Wyline Mood, MD;  Location: [facility];  Service: Gastroenterology;  Laterality: N/A;  USES MED TRANS; WILL NEED TIME ON FRIDAY, 06/24/2022   HARDWARE REMOVAL Left 04/20/2015   Procedure: HARDWARE REMOVAL left leg;  Surgeon: Deeann Saint, MD;  Location: [facility] ORS;  Service: Orthopedics;  Laterality: Left;   KNEE SURGERY Left    NECK SURGERY     x6   SHOULDER SURGERY Left    TOTAL KNEE ARTHROPLASTY Left 04/12/2021   Procedure: TOTAL KNEE ARTHROPLASTY;  Surgeon: Lyndle Herrlich, MD;  Location: [facility] ORS;  Service: Orthopedics;  Laterality: Left;   ULNAR NERVE TRANSPOSITION Right 11/17/2021   Procedure: SUBCUTANEOUS TRANSPOSITION OF ULNAR NERVE, RIGHT ELBOW;  Surgeon: Christena Flake, MD;  Location: [facility]  ORS;  Service: Orthopedics;  Laterality: Right;   ULNAR NERVE TRANSPOSITION Right 10/04/2022   Procedure: ULNAR NERVE TRANSPOSITION;  Surgeon: Lovenia Kim, MD;  Location: [facility] ORS;  Service: Neurosurgery;  Laterality: Right;  NO BLOCK   Patient Active Problem List   Diagnosis Date Noted   Ulnar neuropathy of right upper extremity 10/05/2022   Hand weakness 10/05/2022   Nerve pain 10/05/2022   Adenomatous polyp of colon 06/29/2022   S/P TKR (total knee replacement) using cement, left 04/12/2021   Cervical myelopathy (HCC) 01/13/2021   Seizure (HCC) 11/26/2020   Abnormal thyroid blood test 04/06/2016   Encounter for screening colonoscopy 01/05/2016   Abnormal weight gain 01/05/2016   Chronic prescription benzodiazepine use 10/19/2015   Elevated liver enzymes 08/20/2015   Medication monitoring  encounter 08/20/2015   Hypokalemia 08/20/2015   Erectile dysfunction 04/22/2015   Status post hardware removal 04/20/2015   Bipolar 2 disorder (HCC) 07/29/2014   Marijuana abuse 07/29/2014   Chronic knee pain 07/29/2014   Bipolar 1 disorder, mixed (HCC)    Drug abuse, opioid type (HCC) 06/28/2013   GERD without esophagitis 10/03/2012   Hypertension goal BP (blood pressure) < 140/90 10/03/2012   Chronic pain associated with significant psychosocial dysfunction 07/17/2012   Lumbar canal stenosis 04/09/2012   Hypercholesterolemia 01/06/2012   Constipation 11/11/2011   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/2010   Decreased motor strength 09/30/2010   Current tobacco use 09/30/2010   H/O transient cerebral ischemia 09/09/2010   Low back pain 08/30/2010   Cervical spinal cord compression (HCC) 07/22/2010   Cervical post-laminectomy syndrome 07/22/2010   Chronic obstructive pulmonary disease (HCC) 02/23/2010   ONSET DATE: 10/04/22 (most recent sx)  REFERRING DIAG: G56.21 (ICD-10-CM) - Ulnar neuropathy of right upper extremity   THERAPY DIAG:  Muscle weakness (generalized)  Other lack of coordination  Ulnar neuropathy at elbow of right upper extremity  Rationale for Evaluation and Treatment: Rehabilitation  SUBJECTIVE:  SUBJECTIVE STATEMENT: Pt reports he brought in his brace and putty for OT to see, per therapist's request. Pt accompanied by: self  PERTINENT HISTORY:  Per note from Dr. Katrinka Blazing on 11/16/22: [name] is 2 weeks status post Ulnar nerve decompression, AIN to ulnar nerve transfer, and ulnar nerve peripheral nerve stimulator placement. Given oxycodone on discharge from the hospital.   Per medical record, pt underwent first ulnar nerve transposition on 11/17/21 and participated in OT post surgery.  2nd ulnar nerve transposition done on 10/04/22.  Pt had nerve stimulator placed but pt reports he does not like how it feels so he does not turn it on.  Pt also with hx of 6  cervical spinal surgeries; see above for additional hx.    PRECAUTIONS: None  RED FLAGS: None   WEIGHT BEARING RESTRICTIONS: No  PAIN:  Are you having pain? Yes: NPRS scale: 2/10 at rest, with activity 6-7/10 Pain location: ulnar side of R arm extending from elbow to hand Pain description: pins and needles, occasional spasms, numbness Aggravating factors: "my arm doesn't like the cold, increased activity  Relieving factors: heat, rest, Gabapentin  LIVING ENVIRONMENT: Lives with: alone with 2 dogs  PLOF: Independent, works as a Scientist, water quality  PATIENT GOALS: build up the strength in the R arm and hand  NEXT MD VISIT: Nov 1st to Dr. Katrinka Blazing  OBJECTIVE:  Note: Objective measures were completed at Evaluation unless otherwise noted.  HAND DOMINANCE: Right  ADLs: Overall ADLs: Pt reports he always tries to use the R arm  as able, but in a limited capacity, and is unable to use the R hand with good accuracy or efficiency. Transfers/ambulation related to ADLs: indep Eating: gross grasp in R hand to hold utensils, difficulty cutting food  Grooming: uses L non-dominant hand to shave  UB Dressing: difficulty with clothing fasteners LB Dressing: difficulty with clothing fasteners, extra time to tie shoe laces  Toileting: uses L non-dominant hand for toilet hygiene Bathing: Pt states that he makes himself use the R hand when bathing but control is limited  FUNCTIONAL OUTCOME MEASURES: FOTO: 50; predicted with 56  UPPER EXTREMITY ROM:     Active ROM Right eval Left eval  Shoulder flexion    Shoulder abduction    Shoulder adduction    Shoulder extension    Shoulder internal rotation    Shoulder external rotation    Elbow flexion    Elbow extension 58 65  Wrist flexion    Wrist extension    Wrist ulnar deviation -30 WNL  Wrist radial deviation WNL WNL  Wrist pronation 90 90  Wrist supination 70 75  (Blank rows = not tested)  Active ROM Right eval Left eval  Thumb MCP  (0-60)    Thumb IP (0-80)    Thumb Radial abd/add (0-55)     Thumb Palmar abd/add (0-45)     Thumb Opposition to Small Finger     Index MCP (0-90)     Index PIP (0-100)     Index DIP (0-70)      Long MCP (0-90)      Long PIP (0-100)      Long DIP (0-70)      Ring MCP (0-90)      Ring PIP (0-100)      Ring DIP (0-70)      Little MCP (0-90)      Little PIP (0-100)      Little DIP (0-70)      (Blank rows = not tested)  -R hand digits limited in PIP extension in 4th and 5th digits with neutral wrist (lacking ~25% ext in 4th digit PIP and 50% ext in 5th digit PIP) -limited active digit abd in 4th and 5th digits  -Unable to oppose 4th and 5th digits to thumb   UPPER EXTREMITY MMT:     MMT Right eval Left eval  Shoulder flexion    Shoulder abduction    Shoulder adduction    Shoulder extension    Shoulder internal rotation    Shoulder external rotation    Middle trapezius    Lower trapezius    Elbow flexion 5 5  Elbow extension 5 5  Wrist flexion 4 5  Wrist extension 4+ (simultaneous radial dev) 5  Wrist ulnar deviation 1 5  Wrist radial deviation 5 5  Wrist pronation 5 5  Wrist supination 5 5  (Blank rows = not tested)  HAND FUNCTION: Grip strength: Right: 26 lbs; Left: 95 lbs, Lateral pinch: Right: 7 lbs, Left: 32 lbs, and 3 point pinch: Right: 4 lbs, Left: 27 lbs  COORDINATION: 9 Hole Peg test: Right: 2 min 39 sec; Left: 26 sec  SENSATION: Light touch: Impaired ; ulnar nerve distribution  EDEMA: very mild edema surrounding incision sites on R forearm and elbow   COGNITION: Overall cognitive status: Within functional limits for tasks assessed Areas of impairment:  hx of bipolar 1, schizophrenia, and drug and alcohol abuse  OBSERVATIONS:  Pt pleasant, cooperative, and eager to regain strength in his R dominant  arm.  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   Note for education/handouts: Pt reports he is limited  with his reading and writing ability, but states he will always ask if he doesn't understand something.  TODAY'S TREATMENT:                                                                                                                              DATE: 11/24/22 Moist heat to R elbow/forearm/wrist utilized intermittently throughout session during rest breaks from activities noted below to manage pain.  Therapeutic Activity: Utilized pt's R wrist cock up brace (standard carpal tunnel brace) and applied K-tape to promote 4th and 5th digit MP and PIP extension and neutral wrist (pt radially deviates at baseline d/t ulnar neuropathy).  Pt practiced grasp/release of palm sized items on table top, progressing to re-attempt 9 hole peg test.  9 hole Score improved by nearly 1 min with K-tape and wrist brace; OT encouraged pt keep tape applied over the next 24 hours+ until tape begins to peel, then remove, and to utilize R wrist brace with tape when engaging in R hand [facility] tasks as both strategies improved [facility] performance. (R 9 hole peg test 1 min 40 sec as compared to 2 min 39 sec at eval without K-tape and wrist brace).  Therapeutic Exercise: Issued yellow theraputty (down grade from pt's current green medium resistance putty) and instructed pt in strengthening and coordination exercises for R hand, including gross grasping, lateral/2 point/3 point pinching, digit abd/add, and digging coins out of putty.  Able to return demo with intermittent vc and demonstration of L hand assist to place and/or maintain R hand digits in proper position.  Encouraged pt work up to using green more resistant putty once form/technique improves with yellow less resistive putty. Pt agreed.  Reviewed/completed R passive wrist and digit extension stretch with extended elbow; tactile cues provided to ensure R 4th and 5th digit PIP extension.  Instructed pt in R wrist/forearm strengthening exercises with 2# dumbbell.  Performed wrist  extension with neutral forearm on table top giving external cue to slide dumbbell on pillow case on table top to promote less radial deviation during extension.     PATIENT EDUCATION: Education details: HEP progression; benefits of K-tape and wrist brace for [facility] tasks Person educated: Patient Education method: Explanation Education comprehension: verbalized understanding  HOME EXERCISE PROGRAM: Yellow theraputty, wrist/forearm strengthening with 2# dumbbell.  GOALS: Goals reviewed with patient? Yes  SHORT TERM GOALS: Target date: 01/02/23  Pt will be indep to perform HEP for improving distal RUE flexibility, strength, and coordination. Baseline: Eval: Not yet initiated Goal status: INITIAL  LONG TERM GOALS: Target date: 02/13/23  Pt will increase FOTO score to 56 or better to indicate improvement in self perceived functional use of the R arm with daily tasks. Baseline: Eval: 50 Goal status: INITIAL  2.  Pt will increase R  grip strength by 10 or more lbs to ease ability carry heavy grocery bags in R dominant hand. Baseline: Eval: R grip 26 lbs (non-dominant L 95 lbs) Goal status: INITIAL  3.  Pt will increase R lateral pinch strength by 5 or more lbs to ease ability to open drink bottles. Baseline: Eval: R 7 lbs (non-dominant L 32 lbs) Goal status: INITIAL  4.  Pt will tolerate manual therapy, therapeutic modalities, and exercises to decrease pain in RUE to a reported 4/10 pain or less with activity.   Baseline: Eval: R forearm and hand 6-7/10 pain with activity  Goal status: INITIAL  5.  Pt will increase R hand FMC/dexterity skills to improve efficiency with clothing fasteners as noted by completion of 9 hole peg test in <2 min.  Baseline: Eval: 2 min 39 sec; extra time clothing fasteners, specifically buttons and tying shoe laces. Goal status: INITIAL  ASSESSMENT:  CLINICAL IMPRESSION: Good improvement with FMC using K-tape and wrist brace as noted above; nearly 1 min  improvement on 9 hole peg test using tape and brace.  Pt tolerated all therapeutic exercises well this date with use of moist heat during rest breaks.  Downgraded putty from green to yellow for better form/technique and encouraged pt work up to green.   Pt able to follow pictures on putty handout well, but will continue to review for improving form/technique.  Pt will continue to benefit from skilled OT to address RUE weakness, coordination deficits, stiffness, and pain in the RUE, in order to work towards increased functional use of the R arm for ADL/IADL/work tasks.    PERFORMANCE DEFICITS: in functional skills including ADLs, IADLs, coordination, dexterity, sensation, edema, ROM, strength, pain, fascial restrictions, muscle spasms, flexibility, Fine motor control, body mechanics, decreased knowledge of use of DME, skin integrity, and UE functional use, and psychosocial skills including coping strategies, environmental adaptation, habits, and routines and behaviors.   IMPAIRMENTS: are limiting patient from ADLs, IADLs, rest and sleep, work, and leisure.   COMORBIDITIES: has co-morbidities such as hx of 6 cervical spinal surgeries, bipolar 1, schizophrenia  that affects occupational performance. Patient will benefit from skilled OT to address above impairments and improve overall function.  MODIFICATION OR ASSISTANCE TO COMPLETE EVALUATION: No modification of tasks or assist necessary to complete an evaluation.  OT OCCUPATIONAL PROFILE AND HISTORY: Problem focused assessment: Including review of records relating to presenting problem.  CLINICAL DECISION MAKING: Moderate - several treatment options, min-mod task modification necessary  REHAB POTENTIAL: Good  EVALUATION COMPLEXITY: Moderate      PLAN:  OT FREQUENCY: 2x/week  OT DURATION: 12 weeks  PLANNED INTERVENTIONS: self care/ADL training, therapeutic exercise, therapeutic activity, neuromuscular re-education, manual therapy, scar  mobilization, passive range of motion, splinting, electrical stimulation, paraffin, moist heat, cryotherapy, contrast bath, patient/family education, cognitive remediation/compensation, psychosocial skills training, coping strategies training, and DME and/or AE instructions  RECOMMENDED OTHER SERVICES: None at this time  CONSULTED AND AGREED WITH PLAN OF CARE: Patient  PLAN FOR NEXT SESSION: see above   Danelle Earthly, MS, OTR/L  [employer] Dials, OT 11/24/2022, 4:04 PM

## 2022-11-28 ENCOUNTER — Ambulatory Visit: Payer: MEDICAID

## 2022-11-28 DIAGNOSIS — M6281 Muscle weakness (generalized): Secondary | ICD-10-CM

## 2022-11-28 DIAGNOSIS — R278 Other lack of coordination: Secondary | ICD-10-CM

## 2022-11-28 DIAGNOSIS — G5621 Lesion of ulnar nerve, right upper limb: Secondary | ICD-10-CM

## 2022-11-28 DIAGNOSIS — M25641 Stiffness of right hand, not elsewhere classified: Secondary | ICD-10-CM

## 2022-11-29 NOTE — Therapy (Signed)
OUTPATIENT OCCUPATIONAL THERAPY ORTHO TREATMENT NOTE  Patient Name: Scott Howell MRN: [id number] DOB:1970-10-30, 52 y.o., male Today's Date: 11/29/2022  PCP: Dr. Margarita Howell REFERRING PROVIDER: Dr. Ernestine Howell   END OF SESSION:  OT End of Session - 11/29/22 0837     Visit Number 3    Number of Visits 24    Date for OT Re-Evaluation 02/13/23    Progress Note Due on Visit 10    OT Start Time 1400    OT Stop Time 1445    OT Time Calculation (min) 45 min    Activity Tolerance Patient tolerated treatment well    Behavior During Therapy WFL for tasks assessed/performed            Past Medical History:  Diagnosis Date   Adenomatous polyp of colon    Arthritis    Asthma    Benign fibroma of prostate    Bipolar 1 disorder (HCC)    Cervical myelopathy (HCC)    Cervical post-laminectomy syndrome    Cervical spinal cord compression (HCC)    Chronic knee pain    Chronic pain associated with significant psychosocial dysfunction    Chronic prescription benzodiazepine use    Constipation    COPD (chronic obstructive pulmonary disease) (HCC)    Decreased motor strength    Drug abuse, opioid type (HCC)    Dyspnea    Elevated liver enzymes    Erectile dysfunction    Fibromyalgia    GERD (gastroesophageal reflux disease)    H/O ETOH abuse    Headache    High cholesterol    History of 2019 novel coronavirus disease (COVID-19) 11/01/2021   History of transient cerebral ischemia    Homicidal ideation    Hypertension    Hypokalemia    Living accommodation issues    a.) as of 03/29/2021 --> living in camper   Lower back pain    Lumbar canal stenosis    Marijuana use    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   Pneumonia    Presence of permanent cardiac pacemaker    S/P TKR (total knee replacement)    Schizophrenia (HCC)    Seizures (HCC) 11/2020   Status post hardware removal    Stroke (HCC)    no deficits   Tobacco use    Past Surgical History:   Procedure Laterality Date   ANTERIOR CERVICAL DECOMP/DISCECTOMY FUSION N/A 01/13/2021   Procedure: C3-5 ANTERIOR CERVICAL DECOMPRESSION/DISCECTOMY FUSION;  Surgeon: Scott Night, MD;  Location: [facility] ORS;  Service: Neurosurgery;  Laterality: N/A;   CERVICAL SPINE SURGERY     4   CLAVICLE SURGERY     COLONOSCOPY WITH PROPOFOL N/A 06/29/2022   Procedure: COLONOSCOPY WITH PROPOFOL;  Surgeon: Scott Mood, MD;  Location: [facility] ENDOSCOPY;  Service: Gastroenterology;  Laterality: N/A;  USES MED TRANS; WILL NEED TIME ON FRIDAY, 06/24/2022   HARDWARE REMOVAL Left 04/20/2015   Procedure: HARDWARE REMOVAL left leg;  Surgeon: Scott Saint, MD;  Location: [facility] ORS;  Service: Orthopedics;  Laterality: Left;   KNEE SURGERY Left    NECK SURGERY     x6   SHOULDER SURGERY Left    TOTAL KNEE ARTHROPLASTY Left 04/12/2021   Procedure: TOTAL KNEE ARTHROPLASTY;  Surgeon: Scott Herrlich, MD;  Location: [facility] ORS;  Service: Orthopedics;  Laterality: Left;   ULNAR NERVE TRANSPOSITION Right 11/17/2021   Procedure: SUBCUTANEOUS TRANSPOSITION OF ULNAR NERVE, RIGHT ELBOW;  Surgeon: Scott Flake, MD;  Location: [facility]  ORS;  Service: Orthopedics;  Laterality: Right;   ULNAR NERVE TRANSPOSITION Right 10/04/2022   Procedure: ULNAR NERVE TRANSPOSITION;  Surgeon: Scott Kim, MD;  Location: [facility] ORS;  Service: Neurosurgery;  Laterality: Right;  NO BLOCK   Patient Active Problem List   Diagnosis Date Noted   Ulnar neuropathy of right upper extremity 10/05/2022   Hand weakness 10/05/2022   Nerve pain 10/05/2022   Adenomatous polyp of colon 06/29/2022   S/P TKR (total knee replacement) using cement, left 04/12/2021   Cervical myelopathy (HCC) 01/13/2021   Seizure (HCC) 11/26/2020   Abnormal thyroid blood test 04/06/2016   Encounter for screening colonoscopy 01/05/2016   Abnormal weight gain 01/05/2016   Chronic prescription benzodiazepine use 10/19/2015   Elevated liver enzymes 08/20/2015   Medication monitoring  encounter 08/20/2015   Hypokalemia 08/20/2015   Erectile dysfunction 04/22/2015   Status post hardware removal 04/20/2015   Bipolar 2 disorder (HCC) 07/29/2014   Marijuana abuse 07/29/2014   Chronic knee pain 07/29/2014   Bipolar 1 disorder, mixed (HCC)    Drug abuse, opioid type (HCC) 06/28/2013   GERD without esophagitis 10/03/2012   Hypertension goal BP (blood pressure) < 140/90 10/03/2012   Chronic pain associated with significant psychosocial dysfunction 07/17/2012   Lumbar canal stenosis 04/09/2012   Hypercholesterolemia 01/06/2012   Constipation 11/11/2011   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/2010   Decreased motor strength 09/30/2010   Current tobacco use 09/30/2010   H/O transient cerebral ischemia 09/09/2010   Low back pain 08/30/2010   Cervical spinal cord compression (HCC) 07/22/2010   Cervical post-laminectomy syndrome 07/22/2010   Chronic obstructive pulmonary disease (HCC) 02/23/2010   ONSET DATE: 10/04/22 (most recent sx)  REFERRING DIAG: G56.21 (ICD-10-CM) - Ulnar neuropathy of right upper extremity   THERAPY DIAG:  Muscle weakness (generalized)  Other lack of coordination  Ulnar neuropathy at elbow of right upper extremity  Stiffness of right hand, not elsewhere classified  Rationale for Evaluation and Treatment: Rehabilitation  SUBJECTIVE:  SUBJECTIVE STATEMENT: Pt reports he was able to make his K-Tape last 2 full days and felt good benefit from the tape in using his R hand.  Pt accompanied by: self  PERTINENT HISTORY:  Per note from Scott Howell on 11/16/22: Scott Howell is 2 weeks status post Ulnar nerve decompression, AIN to ulnar nerve transfer, and ulnar nerve peripheral nerve stimulator placement. Given oxycodone on discharge from the hospital.   Per medical record, pt underwent first ulnar nerve transposition on 11/17/21 and participated in OT post surgery.  2nd ulnar nerve transposition done on 10/04/22.  Pt had nerve stimulator placed but pt  reports he does not like how it feels so he does not turn it on.  Pt also with hx of 6 cervical spinal surgeries; see above for additional hx.    PRECAUTIONS: None  RED FLAGS: None   WEIGHT BEARING RESTRICTIONS: No  PAIN:  Are you having pain? Yes: NPRS scale: 1/10 at rest, with activity 6-7/10 Pain location: ulnar side of R arm extending from elbow to hand Pain description: pins and needles, occasional spasms, numbness Aggravating factors: "my arm doesn't like the cold, increased activity  Relieving factors: heat, rest, Gabapentin  LIVING ENVIRONMENT: Lives with: alone with 2 dogs  PLOF: Independent, works as a Scientist, water quality  PATIENT GOALS: build up the strength in the R arm and hand  NEXT MD VISIT: Nov 1st to Scott Howell  OBJECTIVE:  Note: Objective measures were completed at Evaluation unless otherwise noted.  HAND DOMINANCE: Right  ADLs: Overall ADLs: Pt reports he always tries to use the R arm as able, but in a limited capacity, and is unable to use the R hand with good accuracy or efficiency. Transfers/ambulation related to ADLs: indep Eating: gross grasp in R hand to hold utensils, difficulty cutting food  Grooming: uses L non-dominant hand to shave  UB Dressing: difficulty with clothing fasteners LB Dressing: difficulty with clothing fasteners, extra time to tie shoe laces  Toileting: uses L non-dominant hand for toilet hygiene Bathing: Pt states that he makes himself use the R hand when bathing but control is limited  FUNCTIONAL OUTCOME MEASURES: FOTO: 50; predicted with 56  UPPER EXTREMITY ROM:     Active ROM Right eval Left eval  Shoulder flexion    Shoulder abduction    Shoulder adduction    Shoulder extension    Shoulder internal rotation    Shoulder external rotation    Elbow flexion    Elbow extension 58 65  Wrist flexion    Wrist extension    Wrist ulnar deviation -30 WNL  Wrist radial deviation WNL WNL  Wrist pronation 90 90  Wrist supination  70 75  (Blank rows = not tested)  Active ROM Right eval Left eval  Thumb MCP (0-60)    Thumb IP (0-80)    Thumb Radial abd/add (0-55)     Thumb Palmar abd/add (0-45)     Thumb Opposition to Small Finger     Index MCP (0-90)     Index PIP (0-100)     Index DIP (0-70)      Long MCP (0-90)      Long PIP (0-100)      Long DIP (0-70)      Ring MCP (0-90)      Ring PIP (0-100)      Ring DIP (0-70)      Little MCP (0-90)      Little PIP (0-100)      Little DIP (0-70)      (Blank rows = not tested)  -R hand digits limited in PIP extension in 4th and 5th digits with neutral wrist (lacking ~25% ext in 4th digit PIP and 50% ext in 5th digit PIP) -limited active digit abd in 4th and 5th digits  -Unable to oppose 4th and 5th digits to thumb   UPPER EXTREMITY MMT:     MMT Right eval Left eval  Shoulder flexion    Shoulder abduction    Shoulder adduction    Shoulder extension    Shoulder internal rotation    Shoulder external rotation    Middle trapezius    Lower trapezius    Elbow flexion 5 5  Elbow extension 5 5  Wrist flexion 4 5  Wrist extension 4+ (simultaneous radial dev) 5  Wrist ulnar deviation 1 5  Wrist radial deviation 5 5  Wrist pronation 5 5  Wrist supination 5 5  (Blank rows = not tested)  HAND FUNCTION: Grip strength: Right: 26 lbs; Left: 95 lbs, Lateral pinch: Right: 7 lbs, Left: 32 lbs, and 3 point pinch: Right: 4 lbs, Left: 27 lbs  COORDINATION: 9 Hole Peg test: Right: 2 min 39 sec; Left: 26 sec  SENSATION: Light touch: Impaired ; ulnar nerve distribution  EDEMA: very mild edema surrounding incision sites on R forearm and elbow   COGNITION: Overall cognitive status: Within functional limits for tasks assessed Areas of impairment:  hx of bipolar 1, schizophrenia, and drug and  alcohol abuse  OBSERVATIONS:  Pt pleasant, cooperative, and eager to regain strength in his R dominant arm.  Pt verbalizes importance of increasing strength to enable him to  work as a Scientist, water quality.   Note for education/handouts: Pt reports he is limited with his reading and writing ability, but states he will always ask if he doesn't understand something.  TODAY'S TREATMENT:                                                                                                                              DATE: 11/28/22 Moist heat to R elbow/forearm/wrist utilized intermittently throughout session during rest breaks from activities noted below to manage pain.  Therapeutic Activity: Utilized pt's R wrist cock up brace (standard carpal tunnel brace) and applied K-tape to promote 4th and 5th digit MP and PIP extension and neutral wrist (pt radially deviates at baseline d/t ulnar neuropathy).  Instructed pt in self application technique for above and issued 2 pieces for pt to self apply at home with goal to use ~50% stretch to achieve desired digit extension and ulnar deviation at the wrist.  Pt verbalized understanding.  Pt practiced digit extension from a closed hand, using pen as a target to roll on table top via active digit ext for 3 sets 10 reps.  OT provided additional blocking for radial deviation to compensate for what movement pt is able to achieve in carpal tunnel brace.  Removed brace and practiced active ulnar deviation, using pen as a target to tap with ulnar side of hand 3 sets 10 reps; R forearm positioned on wedge in neutral forearm pron/sup, hand hanging off wedge.    Therapeutic Exercise: Facilitated R hand strengthening/flexibility exercises: -Active and AAROM for R hand digit abd x3 sets 10 reps -7 lb digi-flex for 5 sets 10 reps  -Facilitated pinch strengthening with use of therapy resistant clothespins to target lateral and 3 point pinch of R hand.  Able to manage all colors for 1 trial (clipping pins on then removing from vertical dowels) using a lateral pinch, and tolerating yellow, red, and green pins (unable to manage heavier resistance blue and black pins)  using a 3 point pinch.  Intermittent min A to position R LF in line with IF on clip to formulate 3 point pinch pattern. -HEP review for theraputty exercises and wrist strengthening with 2# (or can at home); min vc with use of visual handout. -Prolonged passive wrist and digit extension and ulnar deviation stretch performed intermittently between exercises noted above for tendon lengthening and reducing the occasional spasm.  PATIENT EDUCATION: Education details: HEP review; use and application of K-Tape and wrist brace for [facility] tasks Person educated: Patient Education method: Explanation, demo, handouts Education comprehension: verbalized understanding, demonstrated understanding  HOME EXERCISE PROGRAM: Yellow theraputty, wrist/forearm strengthening with 2# dumbbell.  GOALS: Goals reviewed with patient? Yes  SHORT TERM GOALS: Target date: 01/02/23  Pt will be indep to perform HEP  for improving distal RUE flexibility, strength, and coordination. Baseline: Eval: Not yet initiated Goal status: INITIAL  LONG TERM GOALS: Target date: 02/13/23  Pt will increase FOTO score to 56 or better to indicate improvement in self perceived functional use of the R arm with daily tasks. Baseline: Eval: 50 Goal status: INITIAL  2.  Pt will increase R grip strength by 10 or more lbs to ease ability carry heavy grocery bags in R dominant hand. Baseline: Eval: R grip 26 lbs (non-dominant L 95 lbs) Goal status: INITIAL  3.  Pt will increase R lateral pinch strength by 5 or more lbs to ease ability to open drink bottles. Baseline: Eval: R 7 lbs (non-dominant L 32 lbs) Goal status: INITIAL  4.  Pt will tolerate manual therapy, therapeutic modalities, and exercises to decrease pain in RUE to a reported 4/10 pain or less with activity.   Baseline: Eval: R forearm and hand 6-7/10 pain with activity  Goal status: INITIAL  5.  Pt will increase R hand FMC/dexterity skills to improve efficiency with clothing  fasteners as noted by completion of 9 hole peg test in <2 min.  Baseline: Eval: 2 min 39 sec; extra time clothing fasteners, specifically buttons and tying shoe laces. Goal status: INITIAL  ASSESSMENT:  CLINICAL IMPRESSION: Pt reports he was able to make his K-Tape last 2 full days and felt good benefit from the tape in using his R hand.  K-tape applied again today (as noted above) per pt request with education on self application for home.  Will continue to review application techniques.  Pt was able to manage all colors of clothespins using a lateral pinch, but unable manage blue and black heavier resistant pins using a 3 point pinch, also requiring intermittent assist to place 3rd digit in line with the index finger to formulate correct 3 point pinch pattern.  Pt responded well to all therapeutic exercises and activities noted above, using heat and passive stretching intermittently to reduce pain (minimal pain reported) and muscle spasms during activities.  Pt is demonstrating improved digit ext with use of K-tape and consistent performance of exercises at home on non-therapy days.  Pt demos ability to perform theraputty and wrist strengthening exercises with min vc and visual handouts.  Pt continues to present with pronounced weakness in R wrist and hand along ulnar nerve innervation, impacting functional use of R dominant hand with daily tasks. Pt will continue to benefit from skilled OT to address RUE weakness, coordination deficits, stiffness, and pain in the RUE, in order to work towards increased functional use of the R arm for ADL/IADL/work tasks.    PERFORMANCE DEFICITS: in functional skills including ADLs, IADLs, coordination, dexterity, sensation, edema, ROM, strength, pain, fascial restrictions, muscle spasms, flexibility, Fine motor control, body mechanics, decreased knowledge of use of DME, skin integrity, and UE functional use, and psychosocial skills including coping strategies,  environmental adaptation, habits, and routines and behaviors.   IMPAIRMENTS: are limiting patient from ADLs, IADLs, rest and sleep, work, and leisure.   COMORBIDITIES: has co-morbidities such as hx of 6 cervical spinal surgeries, bipolar 1, schizophrenia  that affects occupational performance. Patient will benefit from skilled OT to address above impairments and improve overall function.  MODIFICATION OR ASSISTANCE TO COMPLETE EVALUATION: No modification of tasks or assist necessary to complete an evaluation.  OT OCCUPATIONAL PROFILE AND HISTORY: Problem focused assessment: Including review of records relating to presenting problem.  CLINICAL DECISION MAKING: Moderate - several treatment options, min-mod task  modification necessary  REHAB POTENTIAL: Good  EVALUATION COMPLEXITY: Moderate      PLAN:  OT FREQUENCY: 2x/week  OT DURATION: 12 weeks  PLANNED INTERVENTIONS: self care/ADL training, therapeutic exercise, therapeutic activity, neuromuscular re-education, manual therapy, scar mobilization, passive range of motion, splinting, electrical stimulation, paraffin, moist heat, cryotherapy, contrast bath, patient/family education, cognitive remediation/compensation, psychosocial skills training, coping strategies training, and DME and/or AE instructions  RECOMMENDED OTHER SERVICES: None at this time  CONSULTED AND AGREED WITH PLAN OF CARE: Patient  PLAN FOR NEXT SESSION: see above  Danelle Earthly, MS, OTR/L  Otis Dials, OT 11/29/2022, 8:40 AM

## 2022-11-30 ENCOUNTER — Ambulatory Visit: Payer: MEDICAID

## 2022-11-30 DIAGNOSIS — M6281 Muscle weakness (generalized): Secondary | ICD-10-CM | POA: Diagnosis not present

## 2022-11-30 DIAGNOSIS — R278 Other lack of coordination: Secondary | ICD-10-CM

## 2022-11-30 DIAGNOSIS — G5621 Lesion of ulnar nerve, right upper limb: Secondary | ICD-10-CM

## 2022-11-30 DIAGNOSIS — M25641 Stiffness of right hand, not elsewhere classified: Secondary | ICD-10-CM

## 2022-11-30 NOTE — Therapy (Unsigned)
OUTPATIENT OCCUPATIONAL THERAPY ORTHO TREATMENT NOTE  Patient Name: Scott Howell MRN: [id number] DOB:1970-10-18, 52 y.o., male Today's Date: 12/01/2022  PCP: Dr. Margarita Mail REFERRING PROVIDER: Dr. Ernestine Mcmurray   END OF SESSION:  OT End of Session - 12/01/22 0944     Visit Number 4    Number of Visits 24    Date for OT Re-Evaluation 02/13/23    Progress Note Due on Visit 10    OT Start Time 1400    OT Stop Time 1445    OT Time Calculation (min) 45 min    Activity Tolerance Patient tolerated treatment well    Behavior During Therapy WFL for tasks assessed/performed             Past Medical History:  Diagnosis Date   Adenomatous polyp of colon    Arthritis    Asthma    Benign fibroma of prostate    Bipolar 1 disorder (HCC)    Cervical myelopathy (HCC)    Cervical post-laminectomy syndrome    Cervical spinal cord compression (HCC)    Chronic knee pain    Chronic pain associated with significant psychosocial dysfunction    Chronic prescription benzodiazepine use    Constipation    COPD (chronic obstructive pulmonary disease) (HCC)    Decreased motor strength    Drug abuse, opioid type (HCC)    Dyspnea    Elevated liver enzymes    Erectile dysfunction    Fibromyalgia    GERD (gastroesophageal reflux disease)    H/O ETOH abuse    Headache    High cholesterol    History of 2019 novel coronavirus disease (COVID-19) 11/01/2021   History of transient cerebral ischemia    Homicidal ideation    Hypertension    Hypokalemia    Living accommodation issues    a.) as of 03/29/2021 --> living in camper   Lower back pain    Lumbar canal stenosis    Marijuana use    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   Pneumonia    Presence of permanent cardiac pacemaker    S/P TKR (total knee replacement)    Schizophrenia (HCC)    Seizures (HCC) 11/2020   Status post hardware removal    Stroke (HCC)    no deficits   Tobacco use    Past Surgical History:   Procedure Laterality Date   ANTERIOR CERVICAL DECOMP/DISCECTOMY FUSION N/A 01/13/2021   Procedure: C3-5 ANTERIOR CERVICAL DECOMPRESSION/DISCECTOMY FUSION;  Surgeon: Venetia Night, MD;  Location: [facility] ORS;  Service: Neurosurgery;  Laterality: N/A;   CERVICAL SPINE SURGERY     4   CLAVICLE SURGERY     COLONOSCOPY WITH PROPOFOL N/A 06/29/2022   Procedure: COLONOSCOPY WITH PROPOFOL;  Surgeon: Wyline Mood, MD;  Location[facility] END[facility]COPY;  Service: Gastroenterology;  Laterality: N/A;  USES MED TRANS; WILL NEED TIME ON FRIDAY, 06/24/2022   HARDWARE REMOVAL Left 04/20/2015   Procedure: HARDWARE REMOVAL left leg;  Surgeon: Deeann Saint, MD;  Location: [facility] ORS;  Service: Orthopedics;  Laterality: Left;   KNEE SURGERY Left    NECK SURGERY     x6   SHOULDER SURGERY Left    TOTAL KNEE ARTHROPLASTY Left 04/12/2021   Procedure: TOTAL KNEE ARTHROPLASTY;  Surgeon: Lyndle Herrlich, MD;  Location: [facility] ORS;  Service: Orthopedics;  Laterality: Left;   ULNAR NERVE TRANSPOSITION Right 11/17/2021   Procedure: SUBCUTANEOUS TRANSPOSITION OF ULNAR NERVE, RIGHT ELBOW;  Surgeon: Christena Flake, MD;  Location:  [facility] ORS;  Service: Orthopedics;  Laterality: Right;   ULNAR NERVE TRANSPOSITION Right 10/04/2022   Procedure: ULNAR NERVE TRANSPOSITION;  Surgeon: Lovenia Kim, MD;  Location: [facility] ORS;  Service: Neurosurgery;  Laterality: Right;  NO BLOCK   Patient Active Problem List   Diagnosis Date Noted   Ulnar neuropathy of right upper extremity 10/05/2022   Hand weakness 10/05/2022   Nerve pain 10/05/2022   Adenomatous polyp of colon 06/29/2022   S/P TKR (total knee replacement) using cement, left 04/12/2021   Cervical myelopathy (HCC) 01/13/2021   Seizure (HCC) 11/26/2020   Abnormal thyroid blood test 04/06/2016   Encounter for screening colonoscopy 01/05/2016   Abnormal weight gain 01/05/2016   Chronic prescription benzodiazepine use 10/19/2015   Elevated liver enzymes 08/20/2015   Medication monitoring  encounter 08/20/2015   Hypokalemia 08/20/2015   Erectile dysfunction 04/22/2015   Status post hardware removal 04/20/2015   Bipolar 2 disorder (HCC) 07/29/2014   Marijuana abuse 07/29/2014   Chronic knee pain 07/29/2014   Bipolar 1 disorder, mixed (HCC)    Drug abuse, opioid type (HCC) 06/28/2013   GERD without esophagitis 10/03/2012   Hypertension goal BP (blood pressure) < 140/90 10/03/2012   Chronic pain associated with significant psychosocial dysfunction 07/17/2012   Lumbar canal stenosis 04/09/2012   Hypercholesterolemia 01/06/2012   Constipation 11/11/2011   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/2010   Decreased motor strength 09/30/2010   Current tobacco use 09/30/2010   H/O transient cerebral ischemia 09/09/2010   Low back pain 08/30/2010   Cervical spinal cord compression (HCC) 07/22/2010   Cervical post-laminectomy syndrome 07/22/2010   Chronic obstructive pulmonary disease (HCC) 02/23/2010   ONSET DATE: 10/04/22 (most recent sx)  REFERRING DIAG: G56.21 (ICD-10-CM) - Ulnar neuropathy of right upper extremity   THERAPY DIAG:  Muscle weakness (generalized)  Other lack of coordination  Ulnar neuropathy at elbow of right upper extremity  Stiffness of right hand, not elsewhere classified  Rationale for Evaluation and Treatment: Rehabilitation  SUBJECTIVE:  SUBJECTIVE STATEMENT: Pt reports some increased soreness and fatigue in the R arm today as he has been helping his aunt tear out her kitchen floor and put new flooring in.  Pt accompanied by: self, grandmother  PERTINENT HISTORY:  Per note from Dr. Katrinka Blazing on 11/16/22: [name] is 2 weeks status post Ulnar nerve decompression, AIN to ulnar nerve transfer, and ulnar nerve peripheral nerve stimulator placement. Given oxycodone on discharge from the hospital.   Per medical record, pt underwent first ulnar nerve transposition on 11/17/21 and participated in OT post surgery.  2nd ulnar nerve transposition done on  10/04/22.  Pt had nerve stimulator placed but pt reports he does not like how it feels so he does not turn it on.  Pt also with hx of 6 cervical spinal surgeries; see above for additional hx.    PRECAUTIONS: None  RED FLAGS: None   WEIGHT BEARING RESTRICTIONS: No  PAIN: Pt reports increased pain in R hand this date d/t helping family pull out flooring, though still only reports minimal pain (2-3/10) Are you having pain? Yes: NPRS scale: 2/10 at rest, with activity 6-7/10 Pain location: ulnar side of R arm extending from elbow to hand Pain description: pins and needles, occasional spasms, numbness Aggravating factors: "my arm doesn't like the cold, increased activity  Relieving factors: heat, rest, Gabapentin  LIVING ENVIRONMENT: Lives with: alone with 2 dogs  PLOF: Independent, works as a Scientist, water quality  PATIENT GOALS: build up the strength in  the R arm and hand  NEXT MD VISIT: Nov 1st to Dr. Katrinka Blazing  OBJECTIVE:  Note: Objective measures were completed at Evaluation unless otherwise noted.  HAND DOMINANCE: Right  ADLs: Overall ADLs: Pt reports he always tries to use the R arm as able, but in a limited capacity, and is unable to use the R hand with good accuracy or efficiency. Transfers/ambulation related to ADLs: indep Eating: gross grasp in R hand to hold utensils, difficulty cutting food  Grooming: uses L non-dominant hand to shave  UB Dressing: difficulty with clothing fasteners LB Dressing: difficulty with clothing fasteners, extra time to tie shoe laces  Toileting: uses L non-dominant hand for toilet hygiene Bathing: Pt states that he makes himself use the R hand when bathing but control is limited  FUNCTIONAL OUTCOME MEASURES: FOTO: 50; predicted with 56  UPPER EXTREMITY ROM:     Active ROM Right eval Left eval  Shoulder flexion    Shoulder abduction    Shoulder adduction    Shoulder extension    Shoulder internal rotation    Shoulder external rotation    Elbow  flexion    Elbow extension 58 65  Wrist flexion    Wrist extension    Wrist ulnar deviation -30 WNL  Wrist radial deviation WNL WNL  Wrist pronation 90 90  Wrist supination 70 75  (Blank rows = not tested)  Active ROM Right eval Left eval  Thumb MCP (0-60)    Thumb IP (0-80)    Thumb Radial abd/add (0-55)     Thumb Palmar abd/add (0-45)     Thumb Opposition to Small Finger     Index MCP (0-90)     Index PIP (0-100)     Index DIP (0-70)      Long MCP (0-90)      Long PIP (0-100)      Long DIP (0-70)      Ring MCP (0-90)      Ring PIP (0-100)      Ring DIP (0-70)      Little MCP (0-90)      Little PIP (0-100)      Little DIP (0-70)      (Blank rows = not tested)  -R hand digits limited in PIP extension in 4th and 5th digits with neutral wrist (lacking ~25% ext in 4th digit PIP and 50% ext in 5th digit PIP) -limited active digit abd in 4th and 5th digits  -Unable to oppose 4th and 5th digits to thumb   UPPER EXTREMITY MMT:     MMT Right eval Left eval  Shoulder flexion    Shoulder abduction    Shoulder adduction    Shoulder extension    Shoulder internal rotation    Shoulder external rotation    Middle trapezius    Lower trapezius    Elbow flexion 5 5  Elbow extension 5 5  Wrist flexion 4 5  Wrist extension 4+ (simultaneous radial dev) 5  Wrist ulnar deviation 1 5  Wrist radial deviation 5 5  Wrist pronation 5 5  Wrist supination 5 5  (Blank rows = not tested)  HAND FUNCTION: Grip strength: Right: 26 lbs; Left: 95 lbs, Lateral pinch: Right: 7 lbs, Left: 32 lbs, and 3 point pinch: Right: 4 lbs, Left: 27 lbs  COORDINATION: 9 Hole Peg test: Right: 2 min 39 sec; Left: 26 sec  SENSATION: Light touch: Impaired ; ulnar nerve distribution  EDEMA: very mild edema surrounding incision sites on  R forearm and elbow   COGNITION: Overall cognitive status: Within functional limits for tasks assessed Areas of impairment:  hx of bipolar 1, schizophrenia, and drug  and alcohol abuse  OBSERVATIONS:  Pt pleasant, cooperative, and eager to regain strength in his R dominant arm.  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   Note for education/handouts: Pt reports he is limited with his reading and writing ability, but states he will always ask if he doesn't understand something.  TODAY'S TREATMENT:                                                                                                                              DATE: 11/30/22 Moist heat to R elbow/forearm/wrist utilized intermittently throughout session during rest breaks from activities noted below to manage pain.  Therapeutic Activity: Utilized pt's R wrist cock up brace (standard carpal tunnel brace) and applied K-tape to promote 4th and 5th digit MP and PIP extension and neutral wrist (pt radially deviates at baseline d/t ulnar neuropathy).  Reviewed self application technique for above and issued pieces for pt to self apply at home over the weekend with goal to use ~50% stretch to achieve desired digit extension and ulnar deviation at the wrist.  Facilitated R forearm, wrist, and hand strengthening with participation in EZ board tools.  Pt worked with long handled tool to facilitate R wrist flex/ext and forearm pron/sup, large base key turn, and large dial turn, completing 3 reps for each tool (up/down board=1 rep).  Pt required cues for positioning and form to minimize compensation, and intermittent min A to maintain tools level on velcro board.   Therapeutic Exercise: Facilitated R hand strengthening/flexibility exercises: -Active and AAROM for R hand digit abd x2 sets 10 reps -Facilitated pinch strengthening with use of therapy resistant clothespins to target lateral and 3 point pinch of R hand.  Able to manage all colors for 1 trial (clipping pins on then removing from vertical dowels) using a lateral pinch, and tolerating yellow, red, and green  and  blue pins (unable to manage heavier resistance black pins) using a 3 point pinch.  Vc for 3rd digit positioning next to index finger when formulating a 3 point pinch pattern. -HEP review for wrist strengthening with 2# (or can at home); min vc with use of visual handout. -Prolonged passive wrist and digit extension and ulnar deviation stretch performed intermittently between exercises noted above for tendon lengthening and reducing the occasional spasm.  PATIENT EDUCATION: Education details: HEP review; use and application of K-Tape and wrist brace for [facility] tasks Person educated: Patient Education method: Explanation, demo, handouts Education comprehension: verbalized understanding, demonstrated understanding  HOME EXERCISE PROGRAM: Yellow theraputty, wrist/forearm strengthening with 2# dumbbell.  GOALS: Goals reviewed with patient? Yes  SHORT TERM GOALS: Target date: 01/02/23  Pt will be indep to perform HEP for improving distal RUE flexibility, strength, and coordination. Baseline: Eval:  Not yet initiated Goal status: INITIAL  LONG TERM GOALS: Target date: 02/13/23  Pt will increase FOTO score to 56 or better to indicate improvement in self perceived functional use of the R arm with daily tasks. Baseline: Eval: 50 Goal status: INITIAL  2.  Pt will increase R grip strength by 10 or more lbs to ease ability carry heavy grocery bags in R dominant hand. Baseline: Eval: R grip 26 lbs (non-dominant L 95 lbs) Goal status: INITIAL  3.  Pt will increase R lateral pinch strength by 5 or more lbs to ease ability to open drink bottles. Baseline: Eval: R 7 lbs (non-dominant L 32 lbs) Goal status: INITIAL  4.  Pt will tolerate manual therapy, therapeutic modalities, and exercises to decrease pain in RUE to a reported 4/10 pain or less with activity.   Baseline: Eval: R forearm and hand 6-7/10 pain with activity  Goal status: INITIAL  5.  Pt will increase R hand FMC/dexterity skills to  improve efficiency with clothing fasteners as noted by completion of 9 hole peg test in <2 min.  Baseline: Eval: 2 min 39 sec; extra time clothing fasteners, specifically buttons and tying shoe laces. Goal status: INITIAL  ASSESSMENT:  CLINICAL IMPRESSION: Pt reports some increased soreness and fatigue in the R arm today as he has been helping his aunt tear out her kitchen floor and put new flooring in this week.  Pt reports that he was able to instruct his aunt on applying K-Tape as noted above to promote ext in R hand ulnar digits and neutral wrist position.  Pt continues to find benefit from taping and wrist brace to improve efficiency when engaging the R hand into daily tasks.  Reviewed wrist strengthening with use of 1 lb dumbbell; pt required min vc for positioning and to follow visual handout.  OT advised pt on use of heat and gentle stretching at home to manage pain/soreness in the R arm, and encouraged increased rest over the weekend after intense/forceful use of the arm this week while putting in flooring for his aunt.  Pt will continue to benefit from skilled OT to address RUE weakness, coordination deficits, stiffness, and pain in the RUE, in order to work towards increased functional use of the R arm for ADL/IADL/work tasks.    PERFORMANCE DEFICITS: in functional skills including ADLs, IADLs, coordination, dexterity, sensation, edema, ROM, strength, pain, fascial restrictions, muscle spasms, flexibility, Fine motor control, body mechanics, decreased knowledge of use of DME, skin integrity, and UE functional use, and psychosocial skills including coping strategies, environmental adaptation, habits, and routines and behaviors.   IMPAIRMENTS: are limiting patient from ADLs, IADLs, rest and sleep, work, and leisure.   COMORBIDITIES: has co-morbidities such as hx of 6 cervical spinal surgeries, bipolar 1, schizophrenia  that affects occupational performance. Patient will benefit from skilled OT  to address above impairments and improve overall function.  MODIFICATION OR ASSISTANCE TO COMPLETE EVALUATION: No modification of tasks or assist necessary to complete an evaluation.  OT OCCUPATIONAL PROFILE AND HISTORY: Problem focused assessment: Including review of records relating to presenting problem.  CLINICAL DECISION MAKING: Moderate - several treatment options, min-mod task modification necessary  REHAB POTENTIAL: Good  EVALUATION COMPLEXITY: Moderate      PLAN:  OT FREQUENCY: 2x/week  OT DURATION: 12 weeks  PLANNED INTERVENTIONS: self care/ADL training, therapeutic exercise, therapeutic activity, neuromuscular re-education, manual therapy, scar mobilization, passive range of motion, splinting, electrical stimulation, paraffin, moist heat, cryotherapy, contrast bath, patient/family education, cognitive  remediation/compensation, psychosocial skills training, coping strategies training, and DME and/or AE instructions  RECOMMENDED OTHER SERVICES: None at this time  CONSULTED AND AGREED WITH PLAN OF CARE: Patient  PLAN FOR NEXT SESSION: see above  Danelle Earthly, MS, OTR/L  Otis Dials, OT 12/01/2022, 9:46 AM

## 2022-12-05 ENCOUNTER — Ambulatory Visit: Payer: MEDICAID

## 2022-12-06 NOTE — Progress Notes (Unsigned)
Established Patient Office Visit  Subjective:  Patient ID: Scott Howell, male    DOB: 06-25-1970  Age: 52 y.o. MRN: [id number]  CC:  No chief complaint on file.   HPI DAEMION SWIMMER presents for follow up on chronic medical conditions. Since our LOV, patient has been following with Neurosurgery and underwent ulnar nerve decompression, AIN to ulnar nerve transfer and ulnar peripheral nerve stimulator placement back in September.   Seizures:  -Now following with Neurology, last seen on 12/05/22 -Currently on Depakote 250 mg BID and Seroquel 300 mg -Unfortunately he found out that his mother passed away in 19-Apr-2022 - he then woke up on and floor and was concerned he had another seizure. No other seizure like activity since that time.  Hypertension: -Medications: Lisinopril 10 mg -Checking BP at home (average): No -Denies any SOB, CP, vision changes, LE edema or symptoms of hypotension  HLD: -Medications: Lipitor 20 mg  -Patient is compliant with above medications and reports no side effects.  -Last lipid panel: 11/22 Lipid Panel     Component Value Date/Time   CHOL 176 06/08/2022 0913   TRIG 181 (H) 06/08/2022 0913   HDL 38 (L) 06/08/2022 0913   CHOLHDL 4.6 06/08/2022 0913   VLDL 22 11/26/2020 0157   LDLCALC 108 (H) 06/08/2022 0913    COPD: -COPD status: stable -Current medications: Symbicort daily, Duonebs and Albuterol PRN -Satisfied with current treatment: yes -Oxygen use: no -Dyspnea frequency: shortness of breath occasionally, non-exertional  -Cough frequency: smoker's cough daily, white mucus -Rescue inhaler frequency: Currently using Albtuerol once-twice a day  -Limitation of activity:  sometimes -Productive cough: yes -Pneumovax: Up to date -Influenza: Up to Date -Lung cancer screening due   Bipolar Disorder: Not on any medications currently, had seen Psychiatry over telemedicine and was given Abilify but is not taking it. Currently on Seroquel.     GERD: -On Pepcid 20 mg BID, controlling symptoms   Health Maintenance: -Blood work UTD -Colon cancer screening: given Cologuard in the past but hasn't completed it - would prefer colonoscopy, referral placed.  -Lung cancer screening due  Past Medical History:  Diagnosis Date   Adenomatous polyp of colon    Arthritis    Asthma    Benign fibroma of prostate    Bipolar 1 disorder (HCC)    Cervical myelopathy (HCC)    Cervical post-laminectomy syndrome    Cervical spinal cord compression (HCC)    Chronic knee pain    Chronic pain associated with significant psychosocial dysfunction    Chronic prescription benzodiazepine use    Constipation    COPD (chronic obstructive pulmonary disease) (HCC)    Decreased motor strength    Drug abuse, opioid type (HCC)    Dyspnea    Elevated liver enzymes    Erectile dysfunction    Fibromyalgia    GERD (gastroesophageal reflux disease)    H/O ETOH abuse    Headache    High cholesterol    History of 2019 novel coronavirus disease (COVID-19) 11/01/2021   History of transient cerebral ischemia    Homicidal ideation    Hypertension    Hypokalemia    Living accommodation issues    a.) as of 03/29/2021 --> living in camper   Lower back pain    Lumbar canal stenosis    Marijuana use    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   Pneumonia    Presence of permanent cardiac pacemaker    S/P  TKR (total knee replacement)    Schizophrenia (HCC)    Seizures (HCC) 11/2020   Status post hardware removal    Stroke [facility])    no deficits   Tobacco use     Past Surgical History:  Procedure Laterality Date   ANTERIOR CERVICAL DECOMP/DISCECTOMY FUSION N/A 01/13/2021   Procedure: C3-5 ANTERIOR CERVICAL DECOMPRESSION/DISCECTOMY FUSION;  Surgeon: Venetia Night, MD;  Location: [facility] ORS;  Service: Neurosurgery;  Laterality: N/A;   CERVICAL SPINE SURGERY     4   CLAVICLE SURGERY     COLONOSCOPY WITH PROPOFOL N/A 06/29/2022   Procedure:  COLONOSCOPY WITH PROPOFOL;  Surgeon: Wyline Mood, MD;  Location: [facility] ENDOSCOPY;  Service: Gastroenterology;  Laterality: N/A;  USES MED TRANS; WILL NEED TIME ON FRIDAY, 06/24/2022   HARDWARE REMOVAL Left 04/20/2015   Procedure: HARDWARE REMOVAL left leg;  Surgeon: Deeann Saint, MD;  Location: [facility] ORS;  Service: Orthopedics;  Laterality: Left;   KNEE SURGERY Left    NECK SURGERY     x6   SHOULDER SURGERY Left    TOTAL KNEE ARTHROPLASTY Left 04/12/2021   Procedure: TOTAL KNEE ARTHROPLASTY;  Surgeon: Lyndle Herrlich, MD;  Location: [facility] ORS;  Service: Orthopedics;  Laterality: Left;   ULNAR NERVE TRANSPOSITION Right 11/17/2021   Procedure: SUBCUTANEOUS TRANSPOSITION OF ULNAR NERVE, RIGHT ELBOW;  Surgeon: Christena Flake, MD;  Location: [facility] ORS;  Service: Orthopedics;  Laterality: Right;   ULNAR NERVE TRANSPOSITION Right 10/04/2022   Procedure: ULNAR NERVE TRANSPOSITION;  Surgeon: Lovenia Kim, MD;  Location: [facility] ORS;  Service: Neurosurgery;  Laterality: Right;  NO BLOCK    Family History  Problem Relation Age of Onset   Diabetes Mother    Hyperlipidemia Mother    Hypertension Mother    Diabetes Maternal Aunt    Cancer Maternal Aunt    Hyperlipidemia Maternal Aunt    Hypertension Maternal Aunt    Diabetes Maternal Uncle    Cancer Maternal Uncle    Hyperlipidemia Maternal Uncle    Hypertension Maternal Uncle     Social History   Socioeconomic History   Marital status: Single    Spouse name: Not on file   Number of children: Not on file   Years of education: Not on file   Highest education level: Not on file  Occupational History   Not on file  Tobacco Use   Smoking status: Every Day    Current packs/day: 1.50    Average packs/day: 1.5 packs/day for 30.0 years (45.0 ttl pk-yrs)    Types: Cigarettes   Smokeless tobacco: Never  Vaping Use   Vaping status: Never Used  Substance and Sexual Activity   Alcohol use: No    Comment: Sober for 10 yrs.   Drug use: Yes     Frequency: 7.0 times per week    Types: Marijuana   Sexual activity: Never  Other Topics Concern   Not on file  Social History Narrative   Not on file   Social Determinants of Health   Financial Resource Strain: Not on file  Food Insecurity: Not on file  Transportation Needs: Not on file  Physical Activity: Not on file  Stress: Not on file  Social Connections: Not on file  Intimate Partner Violence: Not on file    Outpatient Medications Prior to Visit  Medication Sig Dispense Refill   albuterol (VENTOLIN HFA) 108 (90 Base) MCG/ACT inhaler INHALE 2 PUFFS INTO LUNGS EVERY 6 HOURS AS NEEDED FOR WHEEZING 18 each 0  atorvastatin (LIPITOR) 40 MG tablet TAKE 1 TABLET BY MOUTH EVERY DAY 90 tablet 1   budesonide-formoterol (SYMBICORT) 160-4.5 MCG/ACT inhaler Inhale 2 puffs into the lungs 2 (two) times daily. 1 each 3   divalproex (DEPAKOTE) 250 MG DR tablet Take 1 tablet (250 mg total) by mouth 3 (three) times daily. 90 tablet 1   famotidine (PEPCID) 20 MG tablet Take 1 tablet (20 mg total) by mouth 2 (two) times daily. 180 tablet 1   gabapentin (NEURONTIN) 300 MG capsule Take 1 capsule (300 mg total) by mouth 3 (three) times daily. 270 capsule 3   ipratropium-albuterol (DUONEB) 0.5-2.5 (3) MG/3ML SOLN Take 3 mLs by nebulization 2 (two) times daily. 360 mL 1   lisinopril (ZESTRIL) 10 MG tablet TAKE 1 TABLET BY MOUTH EVERY DAY 90 tablet 0   meloxicam (MOBIC) 15 MG tablet Take 15 mg by mouth at bedtime.     QUEtiapine (SEROQUEL) 200 MG tablet Take 500 mg by mouth at bedtime.     tiZANidine (ZANAFLEX) 4 MG capsule Take 2 mg by mouth 3 (three) times daily.     No facility-administered medications prior to visit.    Allergies  Allergen Reactions   Acetaminophen Other (See Comments)    Pt states that it makes him hyper.  .     ROS Review of Systems  Constitutional:  Negative for chills and fever.  Eyes:  Negative for visual disturbance.  Respiratory:  Negative for shortness of breath  and wheezing.   Cardiovascular:  Negative for chest pain and leg swelling.  Gastrointestinal:  Negative for abdominal pain.  Musculoskeletal:  Positive for back pain.  Neurological:  Negative for dizziness and headaches.      Objective:    Physical Exam Constitutional:      Appearance: Normal appearance.  HENT:     Head: Normocephalic and atraumatic.  Eyes:     Conjunctiva/sclera: Conjunctivae normal.  Cardiovascular:     Rate and Rhythm: Normal rate and regular rhythm.  Pulmonary:     Effort: Pulmonary effort is normal.     Breath sounds: Normal breath sounds.  Skin:    General: Skin is warm and dry.  Neurological:     General: No focal deficit present.     Mental Status: He is alert. Mental status is at baseline.  Psychiatric:        Mood and Affect: Mood normal.        Behavior: Behavior normal.     There were no vitals taken for this visit. Wt Readings from Last 3 Encounters:  11/16/22 232 lb (105.2 kg)  10/19/22 232 lb (105.2 kg)  10/04/22 230 lb (104.3 kg)     Health Maintenance Due  Topic Date Due   COVID-19 Vaccine (1) Never done   DTaP/Tdap/Td (1 - Tdap) Never done   Zoster Vaccines- Shingrix (1 of 2) Never done   Lung Cancer Screening  01/08/2020   INFLUENZA VACCINE  09/15/2022    There are no preventive care reminders to display for this patient.  Lab Results  Component Value Date   TSH 0.70 12/16/2020   Lab Results  Component Value Date   WBC 6.8 06/08/2022   HGB 14.5 06/08/2022   HCT 42.8 06/08/2022   MCV 89.2 06/08/2022   PLT 274 06/08/2022   Lab Results  Component Value Date   NA 140 06/08/2022   K 4.9 06/08/2022   CO2 28 06/08/2022   GLUCOSE 88 06/08/2022   BUN 8 06/08/2022  CREATININE 0.87 06/08/2022   BILITOT 0.2 06/08/2022   ALKPHOS 49 11/25/2020   AST 14 06/08/2022   ALT 11 06/08/2022   PROT 7.2 06/08/2022   ALBUMIN 4.5 11/25/2020   CALCIUM 9.9 06/08/2022   ANIONGAP 5 11/12/2021   EGFR 104 06/08/2022   Lab Results   Component Value Date   CHOL 176 06/08/2022   Lab Results  Component Value Date   HDL 38 (L) 06/08/2022   Lab Results  Component Value Date   LDLCALC 108 (H) 06/08/2022   Lab Results  Component Value Date   TRIG 181 (H) 06/08/2022   Lab Results  Component Value Date   CHOLHDL 4.6 06/08/2022   Lab Results  Component Value Date   HGBA1C 5.8 (H) 11/26/2020      Assessment & Plan:   1. Hypertension goal BP (blood pressure) < 140/90: Chronic and stable. Continue Lisinopril 10 mg, refilled. Labs due.   - CBC w/Diff/Platelet - COMPLETE METABOLIC PANEL WITH GFR - lisinopril (ZESTRIL) 10 MG tablet; Take 1 tablet (10 mg total) by mouth daily.  Dispense: 90 tablet; Refill: 1  2. Hypercholesteremia: Recheck lipid panel today. Continue Lipitor 20 mg, refilled.  - Lipid Profile - atorvastatin (LIPITOR) 20 MG tablet; Take 1 tablet (20 mg total) by mouth daily.  Dispense: 90 tablet; Refill: 1  3. Chronic obstructive pulmonary disease, unspecified COPD type: Stable, doing well on current inhalers. Refill Symbicort, Duonebs and continue Albuterol PRN.   - budesonide-formoterol (SYMBICORT) 160-4.5 MCG/ACT inhaler; Inhale 2 puffs into the lungs 2 (two) times daily.  Dispense: 1 each; Refill: 3 - ipratropium-albuterol (DUONEB) 0.5-2.5 (3) MG/3ML SOLN; Take 3 mLs by nebulization 2 (two) times daily.  Dispense: 360 mL; Refill: 1  4. Seizure: Uncertain if what he experienced was a seizure or extreme emotional stress reaction. He is following with Neurology, no further seizure like activity since. Continue Depakote 250 mg TID.  5. GERD without esophagitis: Stable, continue Pepcid 20 mg BID, refilled.   - famotidine (PEPCID) 20 MG tablet; Take 1 tablet (20 mg total) by mouth 2 (two) times daily.  Dispense: 180 tablet; Refill: 1  6. Spinal stenosis of lumbar region, unspecified whether neurogenic claudication present: Referral back to Neurosurgery placed.   - Ambulatory referral to  Neurosurgery  7. Colon cancer screening: Referral for colonoscopy ordered.   - Ambulatory referral to Gastroenterology  8. Screening for lung cancer: Lung cancer screening ordered.   - Ambulatory Referral Lung Cancer Screening [facility]   Follow-up: No follow-ups on file.    Margarita Mail, DO

## 2022-12-07 ENCOUNTER — Ambulatory Visit: Payer: MEDICAID

## 2022-12-07 DIAGNOSIS — G5621 Lesion of ulnar nerve, right upper limb: Secondary | ICD-10-CM

## 2022-12-07 DIAGNOSIS — R278 Other lack of coordination: Secondary | ICD-10-CM

## 2022-12-07 DIAGNOSIS — M6281 Muscle weakness (generalized): Secondary | ICD-10-CM | POA: Diagnosis not present

## 2022-12-08 ENCOUNTER — Ambulatory Visit: Payer: MEDICAID | Admitting: Internal Medicine

## 2022-12-08 ENCOUNTER — Encounter: Payer: Self-pay | Admitting: Internal Medicine

## 2022-12-08 VITALS — BP 122/82 | HR 74 | Temp 98.2°F | Resp 16 | Ht 73.0 in | Wt 231.0 lb

## 2022-12-08 DIAGNOSIS — Z23 Encounter for immunization: Secondary | ICD-10-CM | POA: Diagnosis not present

## 2022-12-08 DIAGNOSIS — K219 Gastro-esophageal reflux disease without esophagitis: Secondary | ICD-10-CM | POA: Diagnosis not present

## 2022-12-08 DIAGNOSIS — I10 Essential (primary) hypertension: Secondary | ICD-10-CM

## 2022-12-08 DIAGNOSIS — E7800 Pure hypercholesterolemia, unspecified: Secondary | ICD-10-CM

## 2022-12-08 DIAGNOSIS — J449 Chronic obstructive pulmonary disease, unspecified: Secondary | ICD-10-CM

## 2022-12-08 MED ORDER — FAMOTIDINE 20 MG PO TABS
20.0000 mg | ORAL_TABLET | Freq: Two times a day (BID) | ORAL | 1 refills | Status: DC
Start: 2022-12-08 — End: 2023-06-01

## 2022-12-08 MED ORDER — LISINOPRIL 10 MG PO TABS
10.0000 mg | ORAL_TABLET | Freq: Every day | ORAL | 1 refills | Status: DC
Start: 2022-12-08 — End: 2023-06-01

## 2022-12-08 MED ORDER — BUDESONIDE-FORMOTEROL FUMARATE 160-4.5 MCG/ACT IN AERO
2.0000 | INHALATION_SPRAY | Freq: Two times a day (BID) | RESPIRATORY_TRACT | 3 refills | Status: DC
Start: 2022-12-08 — End: 2023-05-12

## 2022-12-10 NOTE — Therapy (Signed)
OUTPATIENT OCCUPATIONAL THERAPY ORTHO TREATMENT NOTE  Patient Name: SALAH TINKLEPAUGH MRN: [id number] DOB:04-Dec-1970, 52 y.o., male Today's Date: 12/10/2022  PCP: Dr. Margarita Mail REFERRING PROVIDER: Dr. Ernestine Mcmurray   END OF SESSION:  OT End of Session - 12/10/22 2022     Visit Number 5    Number of Visits 24    Date for OT Re-Evaluation 02/13/23    Progress Note Due on Visit 10    OT Start Time 1400    OT Stop Time 1445    OT Time Calculation (min) 45 min    Activity Tolerance Patient tolerated treatment well    Behavior During Therapy WFL for tasks assessed/performed            Past Medical History:  Diagnosis Date   Adenomatous polyp of colon    Arthritis    Asthma    Benign fibroma of prostate    Bipolar 1 disorder (HCC)    Cervical myelopathy (HCC)    Cervical post-laminectomy syndrome    Cervical spinal cord compression (HCC)    Chronic knee pain    Chronic pain associated with significant psychosocial dysfunction    Chronic prescription benzodiazepine use    Constipation    COPD (chronic obstructive pulmonary disease) (HCC)    Decreased motor strength    Drug abuse, opioid type (HCC)    Dyspnea    Elevated liver enzymes    Erectile dysfunction    Fibromyalgia    GERD (gastroesophageal reflux disease)    H/O ETOH abuse    Headache    High cholesterol    History of 2019 novel coronavirus disease (COVID-19) 11/01/2021   History of transient cerebral ischemia    Homicidal ideation    Hypertension    Hypokalemia    Living accommodation issues    a.) as of 03/29/2021 --> living in camper   Lower back pain    Lumbar canal stenosis    Marijuana use    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   Pneumonia    Presence of permanent cardiac pacemaker    S/P TKR (total knee replacement)    Schizophrenia (HCC)    Seizures (HCC) 11/2020   Status post hardware removal    Stroke (HCC)    no deficits   Tobacco use    Past Surgical History:   Procedure Laterality Date   ANTERIOR CERVICAL DECOMP/DISCECTOMY FUSION N/A 01/13/2021   Procedure: C3-5 ANTERIOR CERVICAL DECOMPRESSION/DISCECTOMY FUSION;  Surgeon: Venetia Night, MD;  Location: [facility] ORS;  Service: Neurosurgery;  Laterality: N/A;   CERVICAL SPINE SURGERY     4   CLAVICLE SURGERY     COLONOSCOPY WITH PROPOFOL N/A 06/29/2022   Procedure: COLONOSCOPY WITH PROPOFOL;  Surgeon: Wyline Mood, MD;  Location: [facility];  Service: Gastroenterology;  Laterality: N/A;  USES MED TRANS; WILL NEED TIME ON FRIDAY, 06/24/2022   HARDWARE REMOVAL Left 04/20/2015   Procedure: HARDWARE REMOVAL left leg;  Surgeon: Deeann Saint, MD;  Location: [facility] ORS;  Service: Orthopedics;  Laterality: Left;   KNEE SURGERY Left    NECK SURGERY     x6   SHOULDER SURGERY Left    TOTAL KNEE ARTHROPLASTY Left 04/12/2021   Procedure: TOTAL KNEE ARTHROPLASTY;  Surgeon: Lyndle Herrlich, MD;  Location: [facility] ORS;  Service: Orthopedics;  Laterality: Left;   ULNAR NERVE TRANSPOSITION Right 11/17/2021   Procedure: SUBCUTANEOUS TRANSPOSITION OF ULNAR NERVE, RIGHT ELBOW;  Surgeon: Christena Flake, MD;  Location: [facility]  ORS;  Service: Orthopedics;  Laterality: Right;   ULNAR NERVE TRANSPOSITION Right 10/04/2022   Procedure: ULNAR NERVE TRANSPOSITION;  Surgeon: Lovenia Kim, MD;  Location: [facility] ORS;  Service: Neurosurgery;  Laterality: Right;  NO BLOCK   Patient Active Problem List   Diagnosis Date Noted   Ulnar neuropathy of right upper extremity 10/05/2022   Hand weakness 10/05/2022   Nerve pain 10/05/2022   Adenomatous polyp of colon 06/29/2022   S/P TKR (total knee replacement) using cement, left 04/12/2021   Cervical myelopathy (HCC) 01/13/2021   Seizure (HCC) 11/26/2020   Abnormal thyroid blood test 04/06/2016   Encounter for screening colonoscopy 01/05/2016   Abnormal weight gain 01/05/2016   Chronic prescription benzodiazepine use 10/19/2015   Elevated liver enzymes 08/20/2015   Medication monitoring  encounter 08/20/2015   Hypokalemia 08/20/2015   Erectile dysfunction 04/22/2015   Status post hardware removal 04/20/2015   Bipolar 2 disorder (HCC) 07/29/2014   Marijuana abuse 07/29/2014   Chronic knee pain 07/29/2014   Bipolar 1 disorder, mixed (HCC)    Drug abuse, opioid type (HCC) 06/28/2013   GERD without esophagitis 10/03/2012   Hypertension goal BP (blood pressure) < 140/90 10/03/2012   Chronic pain associated with significant psychosocial dysfunction 07/17/2012   Lumbar canal stenosis 04/09/2012   Hypercholesterolemia 01/06/2012   Constipation 11/11/2011   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/2010   Decreased motor strength 09/30/2010   Current tobacco use 09/30/2010   H/O transient cerebral ischemia 09/09/2010   Low back pain 08/30/2010   Cervical spinal cord compression (HCC) 07/22/2010   Cervical post-laminectomy syndrome 07/22/2010   Chronic obstructive pulmonary disease (HCC) 02/23/2010   ONSET DATE: 10/04/22 (most recent sx)  REFERRING DIAG: G56.21 (ICD-10-CM) - Ulnar neuropathy of right upper extremity   THERAPY DIAG:  Muscle weakness (generalized)  Other lack of coordination  Ulnar neuropathy at elbow of right upper extremity  Rationale for Evaluation and Treatment: Rehabilitation  SUBJECTIVE:  SUBJECTIVE STATEMENT: Pt reports noticeable improvement in his ability to grasp a fork or a spoon with his R hand. Pt accompanied by: self, grandmother  PERTINENT HISTORY:  Per note from Dr. Katrinka Blazing on 11/16/22: [name] is 2 weeks status post Ulnar nerve decompression, AIN to ulnar nerve transfer, and ulnar nerve peripheral nerve stimulator placement. Given oxycodone on discharge from the hospital.   Per medical record, pt underwent first ulnar nerve transposition on 11/17/21 and participated in OT post surgery.  2nd ulnar nerve transposition done on 10/04/22.  Pt had nerve stimulator placed but pt reports he does not like how it feels so he does not turn it on.  Pt  also with hx of 6 cervical spinal surgeries; see above for additional hx.    PRECAUTIONS: None  RED FLAGS: None   WEIGHT BEARING RESTRICTIONS: No  PAIN: 12/07/22: mild-none at rest/mild with activity Are you having pain? Yes: NPRS scale: 2/10 at rest, with activity 6-7/10 Pain location: ulnar side of R arm extending from elbow to hand Pain description: pins and needles, occasional spasms, numbness Aggravating factors: "my arm doesn't like the cold, increased activity  Relieving factors: heat, rest, Gabapentin  LIVING ENVIRONMENT: Lives with: alone with 2 dogs  PLOF: Independent, works as a Scientist, water quality  PATIENT GOALS: build up the strength in the R arm and hand  NEXT MD VISIT: Nov 1st to Dr. Katrinka Blazing  OBJECTIVE:  Note: Objective measures were completed at Evaluation unless otherwise noted.  HAND DOMINANCE: Right  ADLs: Overall ADLs: Pt reports  he always tries to use the R arm as able, but in a limited capacity, and is unable to use the R hand with good accuracy or efficiency. Transfers/ambulation related to ADLs: indep Eating: gross grasp in R hand to hold utensils, difficulty cutting food  Grooming: uses L non-dominant hand to shave  UB Dressing: difficulty with clothing fasteners LB Dressing: difficulty with clothing fasteners, extra time to tie shoe laces  Toileting: uses L non-dominant hand for toilet hygiene Bathing: Pt states that he makes himself use the R hand when bathing but control is limited  FUNCTIONAL OUTCOME MEASURES: FOTO: 50; predicted with 56  UPPER EXTREMITY ROM:     Active ROM Right eval Left eval  Shoulder flexion    Shoulder abduction    Shoulder adduction    Shoulder extension    Shoulder internal rotation    Shoulder external rotation    Elbow flexion    Elbow extension 58 65  Wrist flexion    Wrist extension    Wrist ulnar deviation -30 WNL  Wrist radial deviation WNL WNL  Wrist pronation 90 90  Wrist supination 70 75  (Blank rows =  not tested)  Active ROM Right eval Left eval  Thumb MCP (0-60)    Thumb IP (0-80)    Thumb Radial abd/add (0-55)     Thumb Palmar abd/add (0-45)     Thumb Opposition to Small Finger     Index MCP (0-90)     Index PIP (0-100)     Index DIP (0-70)      Long MCP (0-90)      Long PIP (0-100)      Long DIP (0-70)      Ring MCP (0-90)      Ring PIP (0-100)      Ring DIP (0-70)      Little MCP (0-90)      Little PIP (0-100)      Little DIP (0-70)      (Blank rows = not tested)  -R hand digits limited in PIP extension in 4th and 5th digits with neutral wrist (lacking ~25% ext in 4th digit PIP and 50% ext in 5th digit PIP) -limited active digit abd in 4th and 5th digits  -Unable to oppose 4th and 5th digits to thumb   UPPER EXTREMITY MMT:     MMT Right eval Left eval  Shoulder flexion    Shoulder abduction    Shoulder adduction    Shoulder extension    Shoulder internal rotation    Shoulder external rotation    Middle trapezius    Lower trapezius    Elbow flexion 5 5  Elbow extension 5 5  Wrist flexion 4 5  Wrist extension 4+ (simultaneous radial dev) 5  Wrist ulnar deviation 1 5  Wrist radial deviation 5 5  Wrist pronation 5 5  Wrist supination 5 5  (Blank rows = not tested)  HAND FUNCTION: Grip strength: Right: 26 lbs; Left: 95 lbs, Lateral pinch: Right: 7 lbs, Left: 32 lbs, and 3 point pinch: Right: 4 lbs, Left: 27 lbs  COORDINATION: 9 Hole Peg test: Right: 2 min 39 sec; Left: 26 sec  SENSATION: Light touch: Impaired ; ulnar nerve distribution  EDEMA: very mild edema surrounding incision sites on R forearm and elbow   COGNITION: Overall cognitive status: Within functional limits for tasks assessed Areas of impairment:  hx of bipolar 1, schizophrenia, and drug and alcohol abuse  OBSERVATIONS:  Pt pleasant, cooperative, and  eager to regain strength in his R dominant arm.  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    Note for education/handouts: Pt reports he is limited with his reading and writing ability, but states he will always ask if he doesn't understand something.  TODAY'S TREATMENT:                                                                                                                              DATE: 12/07/22 Moist heat to R elbow/forearm/wrist utilized intermittently throughout session during rest breaks from activities noted below to manage pain.  Therapeutic Exercise: Facilitated R hand strengthening/flexibility exercises: -Active and AAROM for R hand digit abd x2 sets 10 reps -Facilitated pinch strengthening with use of therapy resistant clothespins to target lateral and 3 point pinch of R hand.  Able to manage all colors for 1 trial (clipping pins on then removing from vertical dowels) using a lateral pinch, pt was able to tolerate all colors of resistance; and up to blue (with difficulty) using a 3 point pinch pattern.  Vc for 3rd digit positioning next to index finger when formulating a 3 point pinch pattern, with occasional min A to initiate this position -Blue digi flex (7#) for 3 sets 10 reps each, progressing to hand gripper with 3 red bands x4 sets 10 reps each -2 sets 10 reps of active assisted R wrist ulnar deviation -Prolonged passive wrist and digit extension and ulnar deviation stretch performed intermittently between exercises noted above for tendon lengthening and reducing the occasional spasm.  Performed above noted exercises with application of K-Tape to promote R wrist neutral positioning and digit extension of ulnar digits.   PATIENT EDUCATION: Education details: HEP review Person educated: Patient Education method: Explanation, demo, handouts Education comprehension: verbalized understanding, demonstrated understanding  HOME EXERCISE PROGRAM: Yellow theraputty, wrist/forearm strengthening with 2# dumbbell.  GOALS: Goals reviewed with patient? Yes  SHORT  TERM GOALS: Target date: 01/02/23  Pt will be indep to perform HEP for improving distal RUE flexibility, strength, and coordination. Baseline: Eval: Not yet initiated Goal status: INITIAL  LONG TERM GOALS: Target date: 02/13/23  Pt will increase FOTO score to 56 or better to indicate improvement in self perceived functional use of the R arm with daily tasks. Baseline: Eval: 50 Goal status: INITIAL  2.  Pt will increase R grip strength by 10 or more lbs to ease ability carry heavy grocery bags in R dominant hand. Baseline: Eval: R grip 26 lbs (non-dominant L 95 lbs) Goal status: INITIAL  3.  Pt will increase R lateral pinch strength by 5 or more lbs to ease ability to open drink bottles. Baseline: Eval: R 7 lbs (non-dominant L 32 lbs) Goal status: INITIAL  4.  Pt will tolerate manual therapy, therapeutic modalities, and exercises to decrease pain in RUE to a reported 4/10 pain or less with activity.   Baseline: Eval: R  forearm and hand 6-7/10 pain with activity  Goal status: INITIAL  5.  Pt will increase R hand FMC/dexterity skills to improve efficiency with clothing fasteners as noted by completion of 9 hole peg test in <2 min.  Baseline: Eval: 2 min 39 sec; extra time clothing fasteners, specifically buttons and tying shoe laces. Goal status: INITIAL  ASSESSMENT:  CLINICAL IMPRESSION: Pt reports noticeable improvement in his ability to grasp a fork or a spoon with his R hand.  Pt was able to progress R grip strengthening activities from blue digi flex (7#), to hand gripper with 3 red bands (moderate resistance).  Pt making steady gains with active digit extension at the ulnar digits.  Pt continues to require active assist to achieve neutral wrist and ulnar deviation.  Pt continues to wear R wrist brace at home to promote neutral wrist positions when engaging the R hand into activities at home.  Pt will continue to benefit from skilled OT to address RUE weakness, coordination deficits,  stiffness, and pain in the RUE, in order to work towards increased functional use of the R arm for ADL/IADL/work tasks.    PERFORMANCE DEFICITS: in functional skills including ADLs, IADLs, coordination, dexterity, sensation, edema, ROM, strength, pain, fascial restrictions, muscle spasms, flexibility, Fine motor control, body mechanics, decreased knowledge of use of DME, skin integrity, and UE functional use, and psychosocial skills including coping strategies, environmental adaptation, habits, and routines and behaviors.   IMPAIRMENTS: are limiting patient from ADLs, IADLs, rest and sleep, work, and leisure.   COMORBIDITIES: has co-morbidities such as hx of 6 cervical spinal surgeries, bipolar 1, schizophrenia  that affects occupational performance. Patient will benefit from skilled OT to address above impairments and improve overall function.  MODIFICATION OR ASSISTANCE TO COMPLETE EVALUATION: No modification of tasks or assist necessary to complete an evaluation.  OT OCCUPATIONAL PROFILE AND HISTORY: Problem focused assessment: Including review of records relating to presenting problem.  CLINICAL DECISION MAKING: Moderate - several treatment options, min-mod task modification necessary  REHAB POTENTIAL: Good  EVALUATION COMPLEXITY: Moderate      PLAN:  OT FREQUENCY: 2x/week  OT DURATION: 12 weeks  PLANNED INTERVENTIONS: self care/ADL training, therapeutic exercise, therapeutic activity, neuromuscular re-education, manual therapy, scar mobilization, passive range of motion, splinting, electrical stimulation, paraffin, moist heat, cryotherapy, contrast bath, patient/family education, cognitive remediation/compensation, psychosocial skills training, coping strategies training, and DME and/or AE instructions  RECOMMENDED OTHER SERVICES: None at this time  CONSULTED AND AGREED WITH PLAN OF CARE: Patient  PLAN FOR NEXT SESSION: see above  Danelle Earthly, MS, OTR/L  Otis Dials, OT 12/10/2022, 8:24 PM

## 2022-12-12 ENCOUNTER — Ambulatory Visit: Payer: MEDICAID

## 2022-12-12 DIAGNOSIS — M6281 Muscle weakness (generalized): Secondary | ICD-10-CM | POA: Diagnosis not present

## 2022-12-12 DIAGNOSIS — G5621 Lesion of ulnar nerve, right upper limb: Secondary | ICD-10-CM

## 2022-12-12 DIAGNOSIS — M25631 Stiffness of right wrist, not elsewhere classified: Secondary | ICD-10-CM

## 2022-12-12 DIAGNOSIS — R278 Other lack of coordination: Secondary | ICD-10-CM

## 2022-12-12 NOTE — Therapy (Signed)
OUTPATIENT OCCUPATIONAL THERAPY ORTHO TREATMENT NOTE  Patient Name: Scott Howell MRN: [id number] DOB:1970-02-25, 52 y.o., male Today's Date: 12/12/2022  PCP: Dr. Margarita Mail REFERRING PROVIDER: Dr. Ernestine Mcmurray   END OF SESSION:  OT End of Session - 12/12/22 1406     Visit Number 6    Number of Visits 24    Date for OT Re-Evaluation 02/13/23    Progress Note Due on Visit 10    OT Start Time 1345    OT Stop Time 1430    OT Time Calculation (min) 45 min    Activity Tolerance Patient tolerated treatment well            Past Medical History:  Diagnosis Date   Adenomatous polyp of colon    Arthritis    Asthma    Benign fibroma of prostate    Bipolar 1 disorder (HCC)    Cervical myelopathy (HCC)    Cervical post-laminectomy syndrome    Cervical spinal cord compression (HCC)    Chronic knee pain    Chronic pain associated with significant psychosocial dysfunction    Chronic prescription benzodiazepine use    Constipation    COPD (chronic obstructive pulmonary disease) (HCC)    Decreased motor strength    Drug abuse, opioid type (HCC)    Dyspnea    Elevated liver enzymes    Erectile dysfunction    Fibromyalgia    GERD (gastroesophageal reflux disease)    H/O ETOH abuse    Headache    High cholesterol    History of 2019 novel coronavirus disease (COVID-19) 11/01/2021   History of transient cerebral ischemia    Homicidal ideation    Hypertension    Hypokalemia    Living accommodation issues    a.) as of 03/29/2021 --> living in camper   Lower back pain    Lumbar canal stenosis    Marijuana use    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   Pneumonia    Presence of permanent cardiac pacemaker    S/P TKR (total knee replacement)    Schizophrenia (HCC)    Seizures (HCC) 11/2020   Status post hardware removal    Stroke (HCC)    no deficits   Tobacco use    Past Surgical History:  Procedure Laterality Date   ANTERIOR CERVICAL DECOMP/DISCECTOMY  FUSION N/A 01/13/2021   Procedure: C3-5 ANTERIOR CERVICAL DECOMPRESSION/DISCECTOMY FUSION;  Surgeon: Venetia Night, MD;  Location: [facility];  Service: Neurosurgery;  Laterality: N/A;   CERVICAL SPINE SURGERY     4   CLAVICLE SURGERY     COLONOSCOPY WITH PROPOFOL N/A 06/29/2022   Procedure: COLONOSCOPY WITH PROPOFOL;  Surgeon: Wyline Mood, MD;  Location: [facility];  Service: Gastroenterology;  Laterality: N/A;  USES MED TRANS; WILL NEED TIME ON FRIDAY, 06/24/2022   HARDWARE REMOVAL Left 04/20/2015   Procedure: HARDWARE REMOVAL left leg;  Surgeon: Deeann Saint, MD;  Location: [facility];  Service: Orthopedics;  Laterality: Left;   KNEE SURGERY Left    NECK SURGERY     x6   SHOULDER SURGERY Left    TOTAL KNEE ARTHROPLASTY Left 04/12/2021   Procedure: TOTAL KNEE ARTHROPLASTY;  Surgeon: Lyndle Herrlich, MD;  Location: [facility];  Service: Orthopedics;  Laterality: Left;   ULNAR NERVE TRANSPOSITION Right 11/17/2021   Procedure: SUBCUTANEOUS TRANSPOSITION OF ULNAR NERVE, RIGHT ELBOW;  Surgeon: Christena Flake, MD;  Location: [facility];  Service: Orthopedics;  Laterality: Right;   ULNAR  NERVE TRANSPOSITION Right 10/04/2022   Procedure: ULNAR NERVE TRANSPOSITION;  Surgeon: Lovenia Kim, MD;  Location: [facility];  Service: Neurosurgery;  Laterality: Right;  NO BLOCK   Patient Active Problem List   Diagnosis Date Noted   Ulnar neuropathy of right upper extremity 10/05/2022   Hand weakness 10/05/2022   Nerve pain 10/05/2022   Adenomatous polyp of colon 06/29/2022   S/P TKR (total knee replacement) using cement, left 04/12/2021   Cervical myelopathy (HCC) 01/13/2021   Seizure (HCC) 11/26/2020   Abnormal thyroid blood test 04/06/2016   Encounter for screening colonoscopy 01/05/2016   Abnormal weight gain 01/05/2016   Chronic prescription benzodiazepine use 10/19/2015   Elevated liver enzymes 08/20/2015   Medication monitoring encounter 08/20/2015   Hypokalemia 08/20/2015   Erectile  dysfunction 04/22/2015   Status post hardware removal 04/20/2015   Bipolar 2 disorder (HCC) 07/29/2014   Marijuana abuse 07/29/2014   Chronic knee pain 07/29/2014   Bipolar 1 disorder, mixed (HCC)    Drug abuse, opioid type (HCC) 06/28/2013   GERD without esophagitis 10/03/2012   Hypertension goal BP (blood pressure) < 140/90 10/03/2012   Chronic pain associated with significant psychosocial dysfunction 07/17/2012   Lumbar canal stenosis 04/09/2012   Hypercholesterolemia 01/06/2012   Constipation 11/11/2011   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/2010   Decreased motor strength 09/30/2010   Current tobacco use 09/30/2010   H/O transient cerebral ischemia 09/09/2010   Low back pain 08/30/2010   Cervical spinal cord compression (HCC) 07/22/2010   Cervical post-laminectomy syndrome 07/22/2010   Chronic obstructive pulmonary disease (HCC) 02/23/2010   ONSET DATE: 10/04/22 (most recent sx)  REFERRING DIAG: G56.21 (ICD-10-CM) - Ulnar neuropathy of right upper extremity   THERAPY DIAG:  Muscle weakness (generalized)  Other lack of coordination  Ulnar neuropathy at elbow of right upper extremity  Stiffness of right wrist, not elsewhere classified  Rationale for Evaluation and Treatment: Rehabilitation  SUBJECTIVE:  SUBJECTIVE STATEMENT: Pt reports having little sleep since Friday d/t one of his dogs being sick. Pt accompanied by: self, grandmother  PERTINENT HISTORY:  Per note from Dr. Katrinka Blazing on 11/16/22: [name] is 2 weeks status post Ulnar nerve decompression, AIN to ulnar nerve transfer, and ulnar nerve peripheral nerve stimulator placement. Given oxycodone on discharge from the hospital.   Per medical record, pt underwent first ulnar nerve transposition on 11/17/21 and participated in OT post surgery.  2nd ulnar nerve transposition done on 10/04/22.  Pt had nerve stimulator placed but pt reports he does not like how it feels so he does not turn it on.  Pt also with hx of 6  cervical spinal surgeries; see above for additional hx.    PRECAUTIONS: None  RED FLAGS: None   WEIGHT BEARING RESTRICTIONS: No  PAIN: 12/12/22: mild-none at rest/mild with activity; intermittent spasms in the wrist and hand today when attempting FM tasks Are you having pain? Yes: NPRS scale: 2/10 at rest, with activity 6-7/10 Pain location: ulnar side of R arm extending from elbow to hand Pain description: pins and needles, occasional spasms, numbness Aggravating factors: "my arm doesn't like the cold, increased activity  Relieving factors: heat, rest, Gabapentin  LIVING ENVIRONMENT: Lives with: alone with 2 dogs  PLOF: Independent, works as a Scientist, water quality  PATIENT GOALS: build up the strength in the R arm and hand  NEXT MD VISIT: Nov 1st to Dr. Katrinka Blazing  OBJECTIVE:  Note: Objective measures were completed at Evaluation unless otherwise noted.  HAND DOMINANCE: Right  ADLs: Overall ADLs: Pt reports he always tries to use the R arm as able, but in a limited capacity, and is unable to use the R hand with good accuracy or efficiency. Transfers/ambulation related to ADLs: indep Eating: gross grasp in R hand to hold utensils, difficulty cutting food  Grooming: uses L non-dominant hand to shave  UB Dressing: difficulty with clothing fasteners LB Dressing: difficulty with clothing fasteners, extra time to tie shoe laces  Toileting: uses L non-dominant hand for toilet hygiene Bathing: Pt states that he makes himself use the R hand when bathing but control is limited  FUNCTIONAL OUTCOME MEASURES: FOTO: 50; predicted with 56  UPPER EXTREMITY ROM:     Active ROM Right eval Left eval  Shoulder flexion    Shoulder abduction    Shoulder adduction    Shoulder extension    Shoulder internal rotation    Shoulder external rotation    Elbow flexion    Elbow extension 58 65  Wrist flexion    Wrist extension    Wrist ulnar deviation -30 WNL  Wrist radial deviation WNL WNL  Wrist  pronation 90 90  Wrist supination 70 75  (Blank rows = not tested)  Active ROM Right eval Left eval  Thumb MCP (0-60)    Thumb IP (0-80)    Thumb Radial abd/add (0-55)     Thumb Palmar abd/add (0-45)     Thumb Opposition to Small Finger     Index MCP (0-90)     Index PIP (0-100)     Index DIP (0-70)      Long MCP (0-90)      Long PIP (0-100)      Long DIP (0-70)      Ring MCP (0-90)      Ring PIP (0-100)      Ring DIP (0-70)      Little MCP (0-90)      Little PIP (0-100)      Little DIP (0-70)      (Blank rows = not tested)  -R hand digits limited in PIP extension in 4th and 5th digits with neutral wrist (lacking ~25% ext in 4th digit PIP and 50% ext in 5th digit PIP) -limited active digit abd in 4th and 5th digits  -Unable to oppose 4th and 5th digits to thumb   UPPER EXTREMITY MMT:     MMT Right eval Left eval  Shoulder flexion    Shoulder abduction    Shoulder adduction    Shoulder extension    Shoulder internal rotation    Shoulder external rotation    Middle trapezius    Lower trapezius    Elbow flexion 5 5  Elbow extension 5 5  Wrist flexion 4 5  Wrist extension 4+ (simultaneous radial dev) 5  Wrist ulnar deviation 1 5  Wrist radial deviation 5 5  Wrist pronation 5 5  Wrist supination 5 5  (Blank rows = not tested)  HAND FUNCTION: Grip strength: Right: 26 lbs; Left: 95 lbs, Lateral pinch: Right: 7 lbs, Left: 32 lbs, and 3 point pinch: Right: 4 lbs, Left: 27 lbs 12/12/22: R grip 35 lbs; R lateral pinch: 8 lbs: 3 point pinch: 4 lbs  COORDINATION: 9 Hole Peg test: Right: 2 min 39 sec; Left: 26 sec  SENSATION: Light touch: Impaired ; ulnar nerve distribution  EDEMA: very mild edema surrounding incision sites on R forearm and elbow   COGNITION: Overall cognitive status: Within functional limits for tasks assessed Areas  of impairment:  hx of bipolar 1, schizophrenia, and drug and alcohol abuse  OBSERVATIONS:  Pt pleasant, cooperative, and eager to  regain strength in his R dominant arm.  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   Note for education/handouts: Pt reports he is limited with his reading and writing ability, but states he will always ask if he doesn't understand something.  TODAY'S TREATMENT:                                                                                                                              DATE: 12/12/22 Moist heat to R elbow/forearm/wrist utilized intermittently throughout session during rest breaks from activities noted below to manage pain.  Therapeutic Exercise: Facilitated R hand strengthening/flexibility exercises: -Active and AAROM for R hand digit abd x2 sets 10 reps -Facilitated pinch strengthening with use of therapy resistant clothespins to target lateral and 3 point pinch of R hand with R wrist brace donned.  Able to manage all colors for 1 trial (clipping pins on then removing from vertical dowels) using a lateral pinch, pt was able to tolerate all colors of resistance; and up to blue (with difficulty) using a 3 point pinch pattern.  Min vc for 3rd digit positioning next to index finger when formulating a 3 point pinch pattern. -3 sets 10 reps of active assisted R wrist ulnar deviation -Prolonged passive wrist and digit extension and ulnar deviation stretch performed intermittently between exercises noted above for tendon lengthening and reducing the occasional spasm; performed above first with pronated forearm, then supinated forearm.  Therapeutic Activity: Facilitated R hand [facility] skills, simulating coin manipulation skills.  Pt practiced picking up washers from magnetic dish, storing up to 3 in hand, then placing and removing washers from R pants pocket.  Pt practiced storing 3 washers in hand, and performing sup/pron movements with goal to avoid dropped washers/"coins".  Practiced forearm supination and digit extension to simulating  opening/closing hand to accept/give change "coins."   PATIENT EDUCATION: Education details: gentle passive forearm, wrist, and digit stretches Person educated: Patient Education method: Explanation, demo Education comprehension: verbalized understanding, demonstrated understanding  HOME EXERCISE PROGRAM: Yellow theraputty, wrist/forearm strengthening with 2# dumbbell.  GOALS: Goals reviewed with patient? Yes  SHORT TERM GOALS: Target date: 01/02/23  Pt will be indep to perform HEP for improving distal RUE flexibility, strength, and coordination. Baseline: Eval: Not yet initiated Goal status: INITIAL  LONG TERM GOALS: Target date: 02/13/23  Pt will increase FOTO score to 56 or better to indicate improvement in self perceived functional use of the R arm with daily tasks. Baseline: Eval: 50 Goal status: INITIAL  2.  Pt will increase R grip strength by 10 or more lbs to ease ability carry heavy grocery bags in R dominant hand. Baseline: Eval: R grip 26 lbs (non-dominant L 95 lbs) Goal status: INITIAL  3.  Pt will increase R lateral pinch  strength by 5 or more lbs to ease ability to open drink bottles. Baseline: Eval: R 7 lbs (non-dominant L 32 lbs) Goal status: INITIAL  4.  Pt will tolerate manual therapy, therapeutic modalities, and exercises to decrease pain in RUE to a reported 4/10 pain or less with activity.   Baseline: Eval: R forearm and hand 6-7/10 pain with activity  Goal status: INITIAL  5.  Pt will increase R hand FMC/dexterity skills to improve efficiency with clothing fasteners as noted by completion of 9 hole peg test in <2 min.  Baseline: Eval: 2 min 39 sec; extra time clothing fasteners, specifically buttons and tying shoe laces. Goal status: INITIAL  ASSESSMENT:  CLINICAL IMPRESSION: Pt showing steady gains with R grip strength; measured today with 9 lb improvement since eval, though still significantly less than L non-dominant arm.  Practiced pinch patterns  with clothespins with wrist brace donned to promote more neutral wrist; otherwise wrist flexes and radially deviates.  Pt acknowledged pins were more challenging to manage with wrist brace donned, but pt was still able to manage all colors with a lateral pinch, and all but black with a 3 point pinch, though pt had to take a couple of rest breaks d/t R hand muscle spasms.  Pt practiced coin manipulation skills this date placing washers in R hand pocket, then removing washers, and storing in hand during pron/sup movements to simulate money handling and accepting change with fairly good control (a few dropped washers).  Pt more easily able to place and remove 1 coin/washer at a time from pocket, and had frequent dropping of coins when trying to remove all 3 at once, with 1 or 2 successful trials of all 3.  Additional R hand cramping noted during attempts at [facility] activities as these require increased effort, but resolved with rest, passive stretching for wrist and digit ext, and intermittent use of heat.  Pt will continue to benefit from skilled OT to address RUE weakness, coordination deficits, stiffness, and pain in the RUE, in order to work towards increased functional use of the R arm for ADL/IADL/work tasks.    PERFORMANCE DEFICITS: in functional skills including ADLs, IADLs, coordination, dexterity, sensation, edema, ROM, strength, pain, fascial restrictions, muscle spasms, flexibility, Fine motor control, body mechanics, decreased knowledge of use of DME, skin integrity, and UE functional use, and psychosocial skills including coping strategies, environmental adaptation, habits, and routines and behaviors.   IMPAIRMENTS: are limiting patient from ADLs, IADLs, rest and sleep, work, and leisure.   COMORBIDITIES: has co-morbidities such as hx of 6 cervical spinal surgeries, bipolar 1, schizophrenia  that affects occupational performance. Patient will benefit from skilled OT to address above impairments and  improve overall function.  MODIFICATION OR ASSISTANCE TO COMPLETE EVALUATION: No modification of tasks or assist necessary to complete an evaluation.  OT OCCUPATIONAL PROFILE AND HISTORY: Problem focused assessment: Including review of records relating to presenting problem.  CLINICAL DECISION MAKING: Moderate - several treatment options, min-mod task modification necessary  REHAB POTENTIAL: Good  EVALUATION COMPLEXITY: Moderate      PLAN:  OT FREQUENCY: 2x/week  OT DURATION: 12 weeks  PLANNED INTERVENTIONS: self care/ADL training, therapeutic exercise, therapeutic activity, neuromuscular re-education, manual therapy, scar mobilization, passive range of motion, splinting, electrical stimulation, paraffin, moist heat, cryotherapy, contrast bath, patient/family education, cognitive remediation/compensation, psychosocial skills training, coping strategies training, and DME and/or AE instructions  RECOMMENDED OTHER SERVICES: None at this time  CONSULTED AND AGREED WITH PLAN OF CARE: Patient  PLAN  FOR NEXT SESSION: see above  Danelle Earthly, MS, OTR/L  [place] Dials, OT 12/12/2022, 2:40 PM

## 2022-12-14 ENCOUNTER — Ambulatory Visit: Payer: MEDICAID

## 2022-12-15 ENCOUNTER — Other Ambulatory Visit: Payer: Self-pay | Admitting: Internal Medicine

## 2022-12-15 ENCOUNTER — Ambulatory Visit: Payer: MEDICAID

## 2022-12-15 DIAGNOSIS — G5621 Lesion of ulnar nerve, right upper limb: Secondary | ICD-10-CM

## 2022-12-15 DIAGNOSIS — R278 Other lack of coordination: Secondary | ICD-10-CM

## 2022-12-15 DIAGNOSIS — M25631 Stiffness of right wrist, not elsewhere classified: Secondary | ICD-10-CM

## 2022-12-15 DIAGNOSIS — M6281 Muscle weakness (generalized): Secondary | ICD-10-CM | POA: Diagnosis not present

## 2022-12-15 DIAGNOSIS — J449 Chronic obstructive pulmonary disease, unspecified: Secondary | ICD-10-CM

## 2022-12-15 NOTE — Telephone Encounter (Signed)
Requested Prescriptions  Pending Prescriptions Disp Refills   albuterol (VENTOLIN HFA) 108 (90 Base) MCG/ACT inhaler [Pharmacy Med Name: VENTOLIN HFA 90 MCG INHALER] 18 each 2    Sig: INHALE 2 PUFFS INTO LUNGS EVERY 6 HOURS AS NEEDED FOR WHEEZING     Pulmonology:  Beta Agonists 2 Passed - 12/15/2022  9:49 AM      Passed - Last BP in normal range    BP Readings from Last 1 Encounters:  12/08/22 122/82         Passed - Last Heart Rate in normal range    Pulse Readings from Last 1 Encounters:  12/08/22 74         Passed - Valid encounter within last 12 months    Recent Outpatient Visits           1 week ago Hypertension goal BP (blood pressure) < 140/90   [facility] Margarita Mail, DO   6 months ago Hypertension goal BP (blood pressure) < 140/90   [facility] Margarita Mail, DO   1 year ago Hypertension goal BP (blood pressure) < 140/90   [facility] Margarita Mail, DO   1 year ago COVID-19   [facility] Margarita Mail, DO   1 year ago Trauma   [facility] Margarita Mail, DO       Future Appointments             In 5 months Margarita Mail, DO [facility], [facility]

## 2022-12-15 NOTE — Therapy (Signed)
OUTPATIENT OCCUPATIONAL THERAPY ORTHO TREATMENT NOTE  Patient Name: Scott Howell MRN: [id number] DOB:04-27-1970, 52 y.o., male Today's Date: 12/15/2022  PCP: Dr. Margarita Mail REFERRING PROVIDER: Dr. Ernestine Mcmurray   END OF SESSION:  OT End of Session - 12/15/22 1317     Visit Number 7    Number of Visits 24    Date for OT Re-Evaluation 02/13/23    Progress Note Due on Visit 10    OT Start Time 1315    OT Stop Time 1400    OT Time Calculation (min) 45 min    Activity Tolerance Patient tolerated treatment well    Behavior During Therapy WFL for tasks assessed/performed            Past Medical History:  Diagnosis Date   Adenomatous polyp of colon    Arthritis    Asthma    Benign fibroma of prostate    Bipolar 1 disorder (HCC)    Cervical myelopathy (HCC)    Cervical post-laminectomy syndrome    Cervical spinal cord compression (HCC)    Chronic knee pain    Chronic pain associated with significant psychosocial dysfunction    Chronic prescription benzodiazepine use    Constipation    COPD (chronic obstructive pulmonary disease) (HCC)    Decreased motor strength    Drug abuse, opioid type (HCC)    Dyspnea    Elevated liver enzymes    Erectile dysfunction    Fibromyalgia    GERD (gastroesophageal reflux disease)    H/O ETOH abuse    Headache    High cholesterol    History of 2019 novel coronavirus disease (COVID-19) 11/01/2021   History of transient cerebral ischemia    Homicidal ideation    Hypertension    Hypokalemia    Living accommodation issues    a.) as of 03/29/2021 --> living in camper   Lower back pain    Lumbar canal stenosis    Marijuana use    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   Pneumonia    Presence of permanent cardiac pacemaker    S/P TKR (total knee replacement)    Schizophrenia (HCC)    Seizures (HCC) 11/2020   Status post hardware removal    Stroke (HCC)    no deficits   Tobacco use    Past Surgical History:   Procedure Laterality Date   ANTERIOR CERVICAL DECOMP/DISCECTOMY FUSION N/A 01/13/2021   Procedure: C3-5 ANTERIOR CERVICAL DECOMPRESSION/DISCECTOMY FUSION;  Surgeon: Venetia Night, MD;  Location: [facility] ORS;  Service: Neurosurgery;  Laterality: N/A;   CERVICAL SPINE SURGERY     4   CLAVICLE SURGERY     COLONOSCOPY WITH PROPOFOL N/A 06/29/2022   Procedure: COLONOSCOPY WITH PROPOFOL;  Surgeon: Wyline Mood, MD;  Location: [facility] ENDOSCOPY;  Service: Gastroenterology;  Laterality: N/A;  USES MED TRANS; WILL NEED TIME ON FRIDAY, 06/24/2022   HARDWARE REMOVAL Left 04/20/2015   Procedure: HARDWARE REMOVAL left leg;  Surgeon: Deeann Saint, MD;  Location: [facility] ORS;  Service: Orthopedics;  Laterality: Left;   KNEE SURGERY Left    NECK SURGERY     x6   SHOULDER SURGERY Left    TOTAL KNEE ARTHROPLASTY Left 04/12/2021   Procedure: TOTAL KNEE ARTHROPLASTY;  Surgeon: Lyndle Herrlich, MD;  Location: [facility] ORS;  Service: Orthopedics;  Laterality: Left;   ULNAR NERVE TRANSPOSITION Right 11/17/2021   Procedure: SUBCUTANEOUS TRANSPOSITION OF ULNAR NERVE, RIGHT ELBOW;  Surgeon: Christena Flake, MD;  Location: [facility]  ORS;  Service: Orthopedics;  Laterality: Right;   ULNAR NERVE TRANSPOSITION Right 10/04/2022   Procedure: ULNAR NERVE TRANSPOSITION;  Surgeon: Lovenia Kim, MD;  Location: [facility] ORS;  Service: Neurosurgery;  Laterality: Right;  NO BLOCK   Patient Active Problem List   Diagnosis Date Noted   Ulnar neuropathy of right upper extremity 10/05/2022   Hand weakness 10/05/2022   Nerve pain 10/05/2022   Adenomatous polyp of colon 06/29/2022   S/P TKR (total knee replacement) using cement, left 04/12/2021   Cervical myelopathy (HCC) 01/13/2021   Seizure (HCC) 11/26/2020   Abnormal thyroid blood test 04/06/2016   Encounter for screening colonoscopy 01/05/2016   Abnormal weight gain 01/05/2016   Chronic prescription benzodiazepine use 10/19/2015   Elevated liver enzymes 08/20/2015   Medication monitoring  encounter 08/20/2015   Hypokalemia 08/20/2015   Erectile dysfunction 04/22/2015   Status post hardware removal 04/20/2015   Bipolar 2 disorder (HCC) 07/29/2014   Marijuana abuse 07/29/2014   Chronic knee pain 07/29/2014   Bipolar 1 disorder, mixed (HCC)    Drug abuse, opioid type (HCC) 06/28/2013   GERD without esophagitis 10/03/2012   Hypertension goal BP (blood pressure) < 140/90 10/03/2012   Chronic pain associated with significant psychosocial dysfunction 07/17/2012   Lumbar canal stenosis 04/09/2012   Hypercholesterolemia 01/06/2012   Constipation 11/11/2011   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/2010   Decreased motor strength 09/30/2010   Current tobacco use 09/30/2010   H/O transient cerebral ischemia 09/09/2010   Low back pain 08/30/2010   Cervical spinal cord compression (HCC) 07/22/2010   Cervical post-laminectomy syndrome 07/22/2010   Chronic obstructive pulmonary disease (HCC) 02/23/2010   ONSET DATE: 10/04/22 (most recent sx)  REFERRING DIAG: G56.21 (ICD-10-CM) - Ulnar neuropathy of right upper extremity   THERAPY DIAG:  Muscle weakness (generalized)  Other lack of coordination  Ulnar neuropathy at elbow of right upper extremity  Stiffness of right wrist, not elsewhere classified  Rationale for Evaluation and Treatment: Rehabilitation  SUBJECTIVE:  SUBJECTIVE STATEMENT: Pt reports finally being able to catch up on some sleep.   Pt accompanied by: self, grandmother  PERTINENT HISTORY:  Per note from Dr. Katrinka Blazing on 11/16/22: [name] is 2 weeks status post Ulnar nerve decompression, AIN to ulnar nerve transfer, and ulnar nerve peripheral nerve stimulator placement. Given oxycodone on discharge from the hospital.   Per medical record, pt underwent first ulnar nerve transposition on 11/17/21 and participated in OT post surgery.  2nd ulnar nerve transposition done on 10/04/22.  Pt had nerve stimulator placed but pt reports he does not like how it feels so he does  not turn it on.  Pt also with hx of 6 cervical spinal surgeries; see above for additional hx.    PRECAUTIONS: None  RED FLAGS: None   WEIGHT BEARING RESTRICTIONS: No  PAIN: 12/15/22: 2/10 pain, up to 8 with heavier physical demands like yard work Are you having pain? Yes: NPRS scale: 2/10 at rest, with activity 6-7/10 Pain location: ulnar side of R arm extending from elbow to hand Pain description: pins and needles, occasional spasms, numbness Aggravating factors: "my arm doesn't like the cold, increased activity  Relieving factors: heat, rest, Gabapentin  LIVING ENVIRONMENT: Lives with: alone with 2 dogs  PLOF: Independent, works as a Scientist, water quality  PATIENT GOALS: build up the strength in the R arm and hand  NEXT MD VISIT: Nov 1st to Dr. Katrinka Blazing  OBJECTIVE:  Note: Objective measures were completed at Evaluation unless otherwise noted.  HAND DOMINANCE: Right  ADLs: Overall ADLs: Pt reports he always tries to use the R arm as able, but in a limited capacity, and is unable to use the R hand with good accuracy or efficiency. Transfers/ambulation related to ADLs: indep Eating: gross grasp in R hand to hold utensils, difficulty cutting food  Grooming: uses L non-dominant hand to shave  UB Dressing: difficulty with clothing fasteners LB Dressing: difficulty with clothing fasteners, extra time to tie shoe laces  Toileting: uses L non-dominant hand for toilet hygiene Bathing: Pt states that he makes himself use the R hand when bathing but control is limited  FUNCTIONAL OUTCOME MEASURES: FOTO: 50; predicted with 56  UPPER EXTREMITY ROM:     Active ROM Right eval Left eval  Shoulder flexion    Shoulder abduction    Shoulder adduction    Shoulder extension    Shoulder internal rotation    Shoulder external rotation    Elbow flexion    Elbow extension 58 65  Wrist flexion    Wrist extension    Wrist ulnar deviation -30 WNL  Wrist radial deviation WNL WNL  Wrist pronation  90 90  Wrist supination 70 75  (Blank rows = not tested)  Active ROM Right eval Left eval  Thumb MCP (0-60)    Thumb IP (0-80)    Thumb Radial abd/add (0-55)     Thumb Palmar abd/add (0-45)     Thumb Opposition to Small Finger     Index MCP (0-90)     Index PIP (0-100)     Index DIP (0-70)      Long MCP (0-90)      Long PIP (0-100)      Long DIP (0-70)      Ring MCP (0-90)      Ring PIP (0-100)      Ring DIP (0-70)      Little MCP (0-90)      Little PIP (0-100)      Little DIP (0-70)      (Blank rows = not tested)  -R hand digits limited in PIP extension in 4th and 5th digits with neutral wrist (lacking ~25% ext in 4th digit PIP and 50% ext in 5th digit PIP) -limited active digit abd in 4th and 5th digits  -Unable to oppose 4th and 5th digits to thumb   UPPER EXTREMITY MMT:     MMT Right eval Left eval  Shoulder flexion    Shoulder abduction    Shoulder adduction    Shoulder extension    Shoulder internal rotation    Shoulder external rotation    Middle trapezius    Lower trapezius    Elbow flexion 5 5  Elbow extension 5 5  Wrist flexion 4 5  Wrist extension 4+ (simultaneous radial dev) 5  Wrist ulnar deviation 1 5  Wrist radial deviation 5 5  Wrist pronation 5 5  Wrist supination 5 5  (Blank rows = not tested)  HAND FUNCTION: Grip strength: Right: 26 lbs; Left: 95 lbs, Lateral pinch: Right: 7 lbs, Left: 32 lbs, and 3 point pinch: Right: 4 lbs, Left: 27 lbs 12/12/22: R grip 35 lbs; R lateral pinch: 8 lbs: 3 point pinch: 4 lbs  COORDINATION: 9 Hole Peg test: Right: 2 min 39 sec; Left: 26 sec  SENSATION: Light touch: Impaired ; ulnar nerve distribution  EDEMA: very mild edema surrounding incision sites on R forearm and elbow   COGNITION: Overall cognitive status: Within functional limits  for tasks assessed Areas of impairment:  hx of bipolar 1, schizophrenia, and drug and alcohol abuse  OBSERVATIONS:  Pt pleasant, cooperative, and eager to regain  strength in his R dominant arm.  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   Note for education/handouts: Pt reports he is limited with his reading and writing ability, but states he will always ask if he doesn't understand something.  TODAY'S TREATMENT:                                                                                                                              DATE: 12/15/22 Moist heat to R elbow/forearm/wrist utilized intermittently throughout session during rest breaks from activities noted below to manage pain.  Therapeutic Activity: Applied K-tape to promote 4th and 5th digit MP and PIP extension and neutral wrist (pt radially deviates at baseline d/t ulnar neuropathy).  Therapeutic Exercise: -Prolonged passive wrist and digit extension and ulnar deviation stretch performed intermittently between exercises noted below  for tendon lengthening and reducing the occasional spasm; performed with pronated forearm on table top. Facilitated R hand strengthening/flexibility exercises: -Combination movement with sustained gripping with simultaneous active assisted ulnar deviation- 3 sets 10 reps (pt grasped hand gripper and sustained grasp of jumbo peg during ulnar deviation reps) -Facilitated hand strengthening with use of hand gripper set at 11.2# to remove jumbo pegs from pegboard x2 trials using R/ hand.  Min vc to focus on neutral wrist while grasping.  Rest break and stretching needed near the end of the 2nd trial d/t R hand cramping.  -Facilitated active and active assisted digit extension with reps of rolling a peg forward on table top.  Assist to block wrist to minimize radial deviation, and active assist to fully extend 5th digit. -practiced 3 point pinch grasping a jumbo peg against resistance (a slight tug).   Self Care: Practiced signing name with R hand with trial of standard pen, vs pen with rubber grip, vs built up foam grip  over pen.  Pt verbalized having the most control with a standard pen without use of a modified grip.  Practiced with paper on table top, and transitioned to paper vertically angled off of table top to simulate signing on a computer at the drug store.  Increased difficulty when hand is unsupported when simulating the latter, but still able and sufficient for a signature.  Advised pt stop signing name with non-dominant hand now that he demonstrates sufficient use of dominant hand for a signature.  Pt receptive.    PATIENT EDUCATION: Education details: adaptive pen grips Person educated: Patient Education method: Explanation, demo Education comprehension: verbalized understanding, demonstrated understanding  HOME EXERCISE PROGRAM: Yellow theraputty, wrist/forearm strengthening with 2# dumbbell.  GOALS: Goals reviewed with patient? Yes  SHORT TERM GOALS: Target date: 01/02/23  Pt will be indep to perform HEP for improving distal RUE flexibility, strength, and coordination. Baseline: Eval: Not  yet initiated Goal status: INITIAL  LONG TERM GOALS: Target date: 02/13/23  Pt will increase FOTO score to 56 or better to indicate improvement in self perceived functional use of the R arm with daily tasks. Baseline: Eval: 50 Goal status: INITIAL  2.  Pt will increase R grip strength by 10 or more lbs to ease ability carry heavy grocery bags in R dominant hand. Baseline: Eval: R grip 26 lbs (non-dominant L 95 lbs) Goal status: INITIAL  3.  Pt will increase R lateral pinch strength by 5 or more lbs to ease ability to open drink bottles. Baseline: Eval: R 7 lbs (non-dominant L 32 lbs) Goal status: INITIAL  4.  Pt will tolerate manual therapy, therapeutic modalities, and exercises to decrease pain in RUE to a reported 4/10 pain or less with activity.   Baseline: Eval: R forearm and hand 6-7/10 pain with activity  Goal status: INITIAL  5.  Pt will increase R hand FMC/dexterity skills to improve  efficiency with clothing fasteners as noted by completion of 9 hole peg test in <2 min.  Baseline: Eval: 2 min 39 sec; extra time clothing fasteners, specifically buttons and tying shoe laces. Goal status: INITIAL  ASSESSMENT:  CLINICAL IMPRESSION: Pt making steady gains with functional use of the R hand, and continues to benefit from K-Tape and carpal tunnel brace to promote optimal wrist positioning and digit extension.  Pt making steady gains with use of a 3 point pinch pattern, noting less slipping of the R 3rd digit when grasping a jumbo peg, and able to sustain pinch of the peg against a slight tug.  R hand continues to fatigue and occasionally experiences cramping in hand with tasks that require higher FM demand, ie hand writing.  Pt able to sign name today sufficiently and legibly several times using dominant hand and standard pen.  Pt pleased as he has been signing name with non-dominant hand.  Pt in agreement to begin using R dominant hand for signatures moving forward.  Pt will continue to benefit from skilled OT to address RUE weakness, coordination deficits, stiffness, and pain in the RUE, in order to work towards increased functional use of the R arm for ADL/IADL/work tasks.    PERFORMANCE DEFICITS: in functional skills including ADLs, IADLs, coordination, dexterity, sensation, edema, ROM, strength, pain, fascial restrictions, muscle spasms, flexibility, Fine motor control, body mechanics, decreased knowledge of use of DME, skin integrity, and UE functional use, and psychosocial skills including coping strategies, environmental adaptation, habits, and routines and behaviors.   IMPAIRMENTS: are limiting patient from ADLs, IADLs, rest and sleep, work, and leisure.   COMORBIDITIES: has co-morbidities such as hx of 6 cervical spinal surgeries, bipolar 1, schizophrenia  that affects occupational performance. Patient will benefit from skilled OT to address above impairments and improve overall  function.  MODIFICATION OR ASSISTANCE TO COMPLETE EVALUATION: No modification of tasks or assist necessary to complete an evaluation.  OT OCCUPATIONAL PROFILE AND HISTORY: Problem focused assessment: Including review of records relating to presenting problem.  CLINICAL DECISION MAKING: Moderate - several treatment options, min-mod task modification necessary  REHAB POTENTIAL: Good  EVALUATION COMPLEXITY: Moderate      PLAN:  OT FREQUENCY: 2x/week  OT DURATION: 12 weeks  PLANNED INTERVENTIONS: self care/ADL training, therapeutic exercise, therapeutic activity, neuromuscular re-education, manual therapy, scar mobilization, passive range of motion, splinting, electrical stimulation, paraffin, moist heat, cryotherapy, contrast bath, patient/family education, cognitive remediation/compensation, psychosocial skills training, coping strategies training, and DME and/or AE instructions  RECOMMENDED OTHER SERVICES: None at this time  CONSULTED AND AGREED WITH PLAN OF CARE: Patient  PLAN FOR NEXT SESSION: see above  Danelle Earthly, MS, OTR/L  Otis Dials, OT 12/15/2022, 2:08 PM

## 2022-12-19 ENCOUNTER — Ambulatory Visit: Payer: MEDICAID | Attending: Neurosurgery

## 2022-12-19 DIAGNOSIS — M25631 Stiffness of right wrist, not elsewhere classified: Secondary | ICD-10-CM | POA: Diagnosis present

## 2022-12-19 DIAGNOSIS — G5621 Lesion of ulnar nerve, right upper limb: Secondary | ICD-10-CM

## 2022-12-19 DIAGNOSIS — M6281 Muscle weakness (generalized): Secondary | ICD-10-CM

## 2022-12-19 DIAGNOSIS — R278 Other lack of coordination: Secondary | ICD-10-CM | POA: Diagnosis present

## 2022-12-19 DIAGNOSIS — M25641 Stiffness of right hand, not elsewhere classified: Secondary | ICD-10-CM | POA: Insufficient documentation

## 2022-12-19 NOTE — Therapy (Signed)
OUTPATIENT OCCUPATIONAL THERAPY ORTHO TREATMENT NOTE  Patient Name: ONESIMO LINGARD MRN: [id number] DOB:[birth year], 52 y.o., male Today's Date: 12/19/2022  PCP: Dr. Margarita Mail REFERRING PROVIDER: Dr. Ernestine Mcmurray   END OF SESSION:  OT End of Session - 12/19/22 1354     Visit Number 8    Number of Visits 24    Date for OT Re-Evaluation 02/13/23    Progress Note Due on Visit 10    OT Start Time 1355    OT Stop Time 1440    OT Time Calculation (min) 45 min    Activity Tolerance Patient tolerated treatment well    Behavior During Therapy WFL for tasks assessed/performed            Past Medical History:  Diagnosis Date   Adenomatous polyp of colon    Arthritis    Asthma    Benign fibroma of prostate    Bipolar 1 disorder (HCC)    Cervical myelopathy (HCC)    Cervical post-laminectomy syndrome    Cervical spinal cord compression (HCC)    Chronic knee pain    Chronic pain associated with significant psychosocial dysfunction    Chronic prescription benzodiazepine use    Constipation    COPD (chronic obstructive pulmonary disease) (HCC)    Decreased motor strength    Drug abuse, opioid type (HCC)    Dyspnea    Elevated liver enzymes    Erectile dysfunction    Fibromyalgia    GERD (gastroesophageal reflux disease)    H/O ETOH abuse    Headache    High cholesterol    History of 2019 novel coronavirus disease (COVID-19) 11/01/2021   History of transient cerebral ischemia    Homicidal ideation    Hypertension    Hypokalemia    Living accommodation issues    a.) as of 03/29/2021 --> living in camper   Lower back pain    Lumbar canal stenosis    Marijuana use    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   Pneumonia    Presence of permanent cardiac pacemaker    S/P TKR (total knee replacement)    Schizophrenia (HCC)    Seizures (HCC) 11/2020   Status post hardware removal    Stroke (HCC)    no deficits   Tobacco use    Past Surgical History:   Procedure Laterality Date   ANTERIOR CERVICAL DECOMP/DISCECTOMY FUSION N/A 01/13/2021   Procedure: C3-5 ANTERIOR CERVICAL DECOMPRESSION/DISCECTOMY FUSION;  Surgeon: Venetia Night, MD;  Location: [facility] ORS;  Service: Neurosurgery;  Laterality: N/A;   CERVICAL SPINE SURGERY     4   CLAVICLE SURGERY     COLONOSCOPY WITH PROPOFOL N/A 06/29/2022   Procedure: COLONOSCOPY WITH PROPOFOL;  Surgeon: Wyline Mood, MD;  Location: [facility];  Service: Gastroenterology;  Laterality: N/A;  USES MED TRANS; WILL NEED TIME ON FRIDAY, 06/24/2022   HARDWARE REMOVAL Left 04/20/2015   Procedure: HARDWARE REMOVAL left leg;  Surgeon: Deeann Saint, MD;  Location: [facility] ORS;  Service: Orthopedics;  Laterality: Left;   KNEE SURGERY Left    NECK SURGERY     x6   SHOULDER SURGERY Left    TOTAL KNEE ARTHROPLASTY Left 04/12/2021   Procedure: TOTAL KNEE ARTHROPLASTY;  Surgeon: Lyndle Herrlich, MD;  Location: [facility] ORS;  Service: Orthopedics;  Laterality: Left;   ULNAR NERVE TRANSPOSITION Right 11/17/2021   Procedure: SUBCUTANEOUS TRANSPOSITION OF ULNAR NERVE, RIGHT ELBOW;  Surgeon: Christena Flake, MD;  Location: [facility]  ORS;  Service: Orthopedics;  Laterality: Right;   ULNAR NERVE TRANSPOSITION Right 10/04/2022   Procedure: ULNAR NERVE TRANSPOSITION;  Surgeon: Lovenia Kim, MD;  Location: [facility] ORS;  Service: Neurosurgery;  Laterality: Right;  NO BLOCK   Patient Active Problem List   Diagnosis Date Noted   Ulnar neuropathy of right upper extremity 10/05/2022   Hand weakness 10/05/2022   Nerve pain 10/05/2022   Adenomatous polyp of colon 06/29/2022   S/P TKR (total knee replacement) using cement, left 04/12/2021   Cervical myelopathy (HCC) 01/13/2021   Seizure (HCC) 11/26/2020   Abnormal thyroid blood test 04/06/2016   Encounter for screening colonoscopy 01/05/2016   Abnormal weight gain 01/05/2016   Chronic prescription benzodiazepine use 10/19/2015   Elevated liver enzymes 08/20/2015   Medication monitoring  encounter 08/20/2015   Hypokalemia 08/20/2015   Erectile dysfunction 04/22/2015   Status post hardware removal 04/20/2015   Bipolar 2 disorder (HCC) 07/29/2014   Marijuana abuse 07/29/2014   Chronic knee pain 07/29/2014   Bipolar 1 disorder, mixed (HCC)    Drug abuse, opioid type (HCC) 06/28/2013   GERD without esophagitis 10/03/2012   Hypertension goal BP (blood pressure) < 140/90 10/03/2012   Chronic pain associated with significant psychosocial dysfunction 07/17/2012   Lumbar canal stenosis 04/09/2012   Hypercholesterolemia 01/06/2012   Constipation 11/11/2011   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/2010   Decreased motor strength 09/30/2010   Current tobacco use 09/30/2010   H/O transient cerebral ischemia 09/09/2010   Low back pain 08/30/2010   Cervical spinal cord compression (HCC) 07/22/2010   Cervical post-laminectomy syndrome 07/22/2010   Chronic obstructive pulmonary disease (HCC) 02/23/2010   ONSET DATE: 10/04/22 (most recent sx)  REFERRING DIAG: G56.21 (ICD-10-CM) - Ulnar neuropathy of right upper extremity   THERAPY DIAG:  Ulnar neuropathy at elbow of right upper extremity  Other lack of coordination  Muscle weakness (generalized)  Rationale for Evaluation and Treatment: Rehabilitation  SUBJECTIVE:  SUBJECTIVE STATEMENT: Pt reports finally being able to catch up on some sleep.   Pt accompanied by: self, grandmother  PERTINENT HISTORY:  Per note from Dr. Katrinka Blazing on 11/16/22: [name] is 2 weeks status post Ulnar nerve decompression, AIN to ulnar nerve transfer, and ulnar nerve peripheral nerve stimulator placement. Given oxycodone on discharge from the hospital.   Per medical record, pt underwent first ulnar nerve transposition on 11/17/21 and participated in OT post surgery.  2nd ulnar nerve transposition done on 10/04/22.  Pt had nerve stimulator placed but pt reports he does not like how it feels so he does not turn it on.  Pt also with hx of 6 cervical spinal  surgeries; see above for additional hx.    PRECAUTIONS: None  RED FLAGS: None   WEIGHT BEARING RESTRICTIONS: No  PAIN: 12/15/22: 2/10 pain, up to 8 with heavier physical demands like yard work Are you having pain? Yes: NPRS scale: 2/10 at rest, with activity 6-7/10 Pain location: ulnar side of R arm extending from elbow to hand Pain description: pins and needles, occasional spasms, numbness Aggravating factors: "my arm doesn't like the cold, increased activity  Relieving factors: heat, rest, Gabapentin  LIVING ENVIRONMENT: Lives with: alone with 2 dogs  PLOF: Independent, works as a Scientist, water quality  PATIENT GOALS: build up the strength in the R arm and hand  NEXT MD VISIT: Nov 1st to Dr. Katrinka Blazing  OBJECTIVE:  Note: Objective measures were completed at Evaluation unless otherwise noted.  HAND DOMINANCE: Right  ADLs: Overall ADLs:  Pt reports he always tries to use the R arm as able, but in a limited capacity, and is unable to use the R hand with good accuracy or efficiency. Transfers/ambulation related to ADLs: indep Eating: gross grasp in R hand to hold utensils, difficulty cutting food  Grooming: uses L non-dominant hand to shave  UB Dressing: difficulty with clothing fasteners LB Dressing: difficulty with clothing fasteners, extra time to tie shoe laces  Toileting: uses L non-dominant hand for toilet hygiene Bathing: Pt states that he makes himself use the R hand when bathing but control is limited  FUNCTIONAL OUTCOME MEASURES: FOTO: 50; predicted with 56  UPPER EXTREMITY ROM:     Active ROM Right eval Left eval  Shoulder flexion    Shoulder abduction    Shoulder adduction    Shoulder extension    Shoulder internal rotation    Shoulder external rotation    Elbow flexion    Elbow extension 58 65  Wrist flexion    Wrist extension    Wrist ulnar deviation -30 WNL  Wrist radial deviation WNL WNL  Wrist pronation 90 90  Wrist supination 70 75  (Blank rows = not  tested)  Active ROM Right eval Left eval  Thumb MCP (0-60)    Thumb IP (0-80)    Thumb Radial abd/add (0-55)     Thumb Palmar abd/add (0-45)     Thumb Opposition to Small Finger     Index MCP (0-90)     Index PIP (0-100)     Index DIP (0-70)      Long MCP (0-90)      Long PIP (0-100)      Long DIP (0-70)      Ring MCP (0-90)      Ring PIP (0-100)      Ring DIP (0-70)      Little MCP (0-90)      Little PIP (0-100)      Little DIP (0-70)      (Blank rows = not tested)  -R hand digits limited in PIP extension in 4th and 5th digits with neutral wrist (lacking ~25% ext in 4th digit PIP and 50% ext in 5th digit PIP) -limited active digit abd in 4th and 5th digits  -Unable to oppose 4th and 5th digits to thumb   UPPER EXTREMITY MMT:     MMT Right eval Left eval  Shoulder flexion    Shoulder abduction    Shoulder adduction    Shoulder extension    Shoulder internal rotation    Shoulder external rotation    Middle trapezius    Lower trapezius    Elbow flexion 5 5  Elbow extension 5 5  Wrist flexion 4 5  Wrist extension 4+ (simultaneous radial dev) 5  Wrist ulnar deviation 1 5  Wrist radial deviation 5 5  Wrist pronation 5 5  Wrist supination 5 5  (Blank rows = not tested)  HAND FUNCTION: Grip strength: Right: 26 lbs; Left: 95 lbs, Lateral pinch: Right: 7 lbs, Left: 32 lbs, and 3 point pinch: Right: 4 lbs, Left: 27 lbs 12/12/22: R grip 35 lbs; R lateral pinch: 8 lbs: 3 point pinch: 4 lbs  COORDINATION: 9 Hole Peg test: Right: 2 min 39 sec; Left: 26 sec  SENSATION: Light touch: Impaired ; ulnar nerve distribution  EDEMA: very mild edema surrounding incision sites on R forearm and elbow   COGNITION: Overall cognitive status: Within functional limits for tasks assessed Areas of impairment:  hx of bipolar 1, schizophrenia, and drug and alcohol abuse  OBSERVATIONS:  Pt pleasant, cooperative, and eager to regain strength in his R dominant arm.  Pt verbalizes  importance of increasing strength to enable him to work as a Scientist, water quality.   Note for education/handouts: Pt reports he is limited with his reading and writing ability, but states he will always ask if he doesn't understand something.  TODAY'S TREATMENT:                                                                                                                              DATE: 12/19/22  Therapeutic Activity: Applied K-tape to promote 4th and 5th digit MP and PIP extension and neutral wrist (pt radially deviates at baseline d/t ulnar neuropathy). Pt worked on grasping flat washers from a tabletop surface and placing them onto a dowel. Requires moving washers to edge of table prior to successfully pinching them and placing on dowel. Unable to store in palm without dropping.   Therapeutic Exercise: -Prolonged passive wrist and digit extension and ulnar deviation stretch performed intermittently between exercises noted below  for tendon lengthening and reducing the occasional spasm; performed with pronated forearm on table top. Facilitated R hand strengthening/flexibility exercises: -Facilitated hand strengthening with use of hand gripper set at 17.9# to place jumbo pegs onto pegboard and remove using R/ hand.  Min vc to focus on neutral wrist while grasping.  Rest break and stretching between each set. Repeated with gripper set to 11.2#.  -practiced 3 point pinch grasping a jumbo peg against resistance (a slight tug).  - worked on Marketing executive in the right hand for lateral and 3pt. pinch using yellow, red, green, blue, and black resistive clips. Placed clips at vertical angle and removed from horizontal angle. Unable to achieve 3 pt pinch to remove black clips.    PATIENT EDUCATION: Education details: adaptive pen grips Person educated: Patient Education method: Explanation, demo Education comprehension: verbalized understanding, demonstrated understanding  HOME EXERCISE  PROGRAM: Yellow theraputty, wrist/forearm strengthening with 2# dumbbell.  GOALS: Goals reviewed with patient? Yes  SHORT TERM GOALS: Target date: 01/02/23  Pt will be indep to perform HEP for improving distal RUE flexibility, strength, and coordination. Baseline: Eval: Not yet initiated Goal status: INITIAL  LONG TERM GOALS: Target date: 02/13/23  Pt will increase FOTO score to 56 or better to indicate improvement in self perceived functional use of the R arm with daily tasks. Baseline: Eval: 50 Goal status: INITIAL  2.  Pt will increase R grip strength by 10 or more lbs to ease ability carry heavy grocery bags in R dominant hand. Baseline: Eval: R grip 26 lbs (non-dominant L 95 lbs) Goal status: INITIAL  3.  Pt will increase R lateral pinch strength by 5 or more lbs to ease ability to open drink bottles. Baseline: Eval: R 7 lbs (non-dominant L 32 lbs) Goal status: INITIAL  4.  Pt will tolerate manual  therapy, therapeutic modalities, and exercises to decrease pain in RUE to a reported 4/10 pain or less with activity.   Baseline: Eval: R forearm and hand 6-7/10 pain with activity  Goal status: INITIAL  5.  Pt will increase R hand FMC/dexterity skills to improve efficiency with clothing fasteners as noted by completion of 9 hole peg test in <2 min.  Baseline: Eval: 2 min 39 sec; extra time clothing fasteners, specifically buttons and tying shoe laces. Goal status: INITIAL  ASSESSMENT:  CLINICAL IMPRESSION: Pt continues to benefit from K-Tape to promote optimal wrist positioning and digit extension - reports he has purchased some tape but is unable to cut it on his own. Plan to bring in for OT assistance pre-cutting strips. Pt tolerated increased resistance of hand gripper to 17.9# to place and remove pegs, 2nd trial returned to 11.2#. Pt will continue to benefit from skilled OT to address RUE weakness, coordination deficits, stiffness, and pain in the RUE, in order to work towards  increased functional use of the R arm for ADL/IADL/work tasks.    PERFORMANCE DEFICITS: in functional skills including ADLs, IADLs, coordination, dexterity, sensation, edema, ROM, strength, pain, fascial restrictions, muscle spasms, flexibility, Fine motor control, body mechanics, decreased knowledge of use of DME, skin integrity, and UE functional use, and psychosocial skills including coping strategies, environmental adaptation, habits, and routines and behaviors.   IMPAIRMENTS: are limiting patient from ADLs, IADLs, rest and sleep, work, and leisure.   COMORBIDITIES: has co-morbidities such as hx of 6 cervical spinal surgeries, bipolar 1, schizophrenia  that affects occupational performance. Patient will benefit from skilled OT to address above impairments and improve overall function.  MODIFICATION OR ASSISTANCE TO COMPLETE EVALUATION: No modification of tasks or assist necessary to complete an evaluation.  OT OCCUPATIONAL PROFILE AND HISTORY: Problem focused assessment: Including review of records relating to presenting problem.  CLINICAL DECISION MAKING: Moderate - several treatment options, min-mod task modification necessary  REHAB POTENTIAL: Good  EVALUATION COMPLEXITY: Moderate      PLAN:  OT FREQUENCY: 2x/week  OT DURATION: 12 weeks  PLANNED INTERVENTIONS: self care/ADL training, therapeutic exercise, therapeutic activity, neuromuscular re-education, manual therapy, scar mobilization, passive range of motion, splinting, electrical stimulation, paraffin, moist heat, cryotherapy, contrast bath, patient/family education, cognitive remediation/compensation, psychosocial skills training, coping strategies training, and DME and/or AE instructions  RECOMMENDED OTHER SERVICES: None at this time  CONSULTED AND AGREED WITH PLAN OF CARE: Patient  PLAN FOR NEXT SESSION: see above  Kathie Dike, M.S. OTR/L  12/19/22, 2:12 PM  ascom [phone]   Presley Raddle, OT 12/19/2022,  2:06 PM

## 2022-12-21 ENCOUNTER — Ambulatory Visit: Payer: MEDICAID

## 2022-12-22 ENCOUNTER — Ambulatory Visit: Payer: MEDICAID

## 2022-12-22 DIAGNOSIS — G5621 Lesion of ulnar nerve, right upper limb: Secondary | ICD-10-CM | POA: Diagnosis not present

## 2022-12-22 DIAGNOSIS — R278 Other lack of coordination: Secondary | ICD-10-CM

## 2022-12-22 DIAGNOSIS — M6281 Muscle weakness (generalized): Secondary | ICD-10-CM

## 2022-12-22 DIAGNOSIS — M25641 Stiffness of right hand, not elsewhere classified: Secondary | ICD-10-CM

## 2022-12-22 DIAGNOSIS — M25631 Stiffness of right wrist, not elsewhere classified: Secondary | ICD-10-CM

## 2022-12-23 NOTE — Progress Notes (Unsigned)
REFERRING PHYSICIAN:  Margarita Mail, Do [address] [place],  [place] [address]  [name] has a history of seizures, HTN, hyperlipidemia, COPD, bipolar, FM, history of ETOH abuse, and GERD.   He is s/p ulnar nerve decompression, AIN to ulnar nerve transfer, and ulnar nerve peripheral nerve stimulator placement by Dr. Katrinka Blazing. On 10/04/22.   His pain was improving at his last visit, but he did not like the feeling of the stimulator, so he stopped using it. Dr. Katrinka Blazing sent him to OT and he was to follow up prn.   History of ACDF C3-C5 for myelopathy on 01/13/21 with Dr. Myer Haff. History of dysfunction in right hand due to history of multiple fractures.   PMR referred him to [facility] for left C7-T1 IL ESI. This has not been done.      He continues with intermittent neck pain that radiates to left scapular region. No arm pain. He has numbness, tingling, weakness in right hand as above.  Neck pain can be sharp and stabbing.     Bowel/Bladder Dysfunction: none   He smokes 1 and 1/2 ppd x 30 years.    Conservative measures:  Physical therapy: no recent Multimodal medical therapy including regular antiinflammatories: celebrex, robaxin, oxycodone  Injections: No recent epidural steroid injections   Past Surgery:  History of ACDF C3-C5 for myelopathy on 01/13/21 with Dr. Myer Haff History of 5 previous cervical fusion surgeries   Scott Howell has no symptoms of cervical myelopathy. Chronic issues with right hand. No new balance issues.    The symptoms are causing a significant impact on the patient's life.   PHYSICAL EXAMINATION:  Awake, alert, oriented to person, place, and time.  Speech is clear and fluent. Fund of knowledge is appropriate.    Cranial Nerves: Pupils equal round and reactive to light.  Facial tone is symmetric.     He has lower posterior cervical tenderness that is worse on left. He has tenderness left medial scapular border.   Mild  diffuse posterior lumbar tenderness.     No abnormal lesions on exposed skin.    Strength: Side Biceps Triceps Deltoid Interossei Grip Wrist Ext. Wrist Flex.  R 5 5 5 5  --- 5 5  L 5 5 5 5 5 5 5     Side Iliopsoas Quads Hamstring PF DF EHL  R 5 5 5 5 5 5   L 5 5 5 5 5 5     Reflexes are 2+ and symmetric at the biceps, triceps, brachioradialis, patella and achilles.   Hoffman's is positive in both hands.    Bilateral upper and lower extremity sensation is intact to light touch, but diminished in medial calf bilaterally. Also diminished ulnar aspect right arm from elbow into hand.    Limited use of right small/ring finger. No gross weakness in grip strength, but he can only use his index and middle finger.    He has a slow limping gait.     He is almost able to fully extend his fingers, he has good flexion/extension of wrist with no pain. Good grip strength. He continues with diminished sensation in ulnar nerve distribution. ***   Assessment / Plan: Scott Howell is doing well s/p above surgery. Treatment options reviewed with patient and following plan made:   - Rep was in to see him regarding reprogramming.  - Hold on formal OT for now.  Okay to continue with HEP per Dr. Katrinka Blazing.   - Reviewed wound care.  -  Follow up as scheduled with Dr. Katrinka Blazing in 4 weeks and prn.   As for his neck, Dr. Myer Haff recommends he recover from above surgery prior to considering any surgical intervention for his neck. He also would recommend trial of PT/dry needling prior to consideration of further surgery. PMR has ordered cervical ESI for him, he is going to hold on this until he recovers as well. Thoracic MRI looks okay and would not explain his chronic balance issues.   He will follow up with [place] for his neck once he is recovered from above surgery.   Advised to contact the office if any questions or concerns arise.  I spent a total of 15 minutes in face-to-face and non-face-to-face activities related  to this patient's care today regarding his cervical and thoracic spine including review of outside records, review of imaging, review of symptoms, physical exam, discussion of differential diagnosis, discussion of treatment options, and documentation.   Drake Leach PA-C Dept of Neurosurgery

## 2022-12-23 NOTE — Therapy (Signed)
OUTPATIENT OCCUPATIONAL THERAPY ORTHO TREATMENT NOTE  Patient Name: Scott Howell MRN: [id number] DOB:Jun 27, 1970, 52 y.o., male Today's Date: 12/23/2022  PCP: Dr. Margarita Mail REFERRING PROVIDER: Dr. Ernestine Mcmurray   END OF SESSION:  OT End of Session - 12/23/22 0826     Visit Number 9    Number of Visits 24    Date for OT Re-Evaluation 02/13/23    Progress Note Due on Visit 10    OT Start Time 1315    OT Stop Time 1400    OT Time Calculation (min) 45 min    Activity Tolerance Patient tolerated treatment well    Behavior During Therapy WFL for tasks assessed/performed            Past Medical History:  Diagnosis Date   Adenomatous polyp of colon    Arthritis    Asthma    Benign fibroma of prostate    Bipolar 1 disorder (HCC)    Cervical myelopathy (HCC)    Cervical post-laminectomy syndrome    Cervical spinal cord compression (HCC)    Chronic knee pain    Chronic pain associated with significant psychosocial dysfunction    Chronic prescription benzodiazepine use    Constipation    COPD (chronic obstructive pulmonary disease) (HCC)    Decreased motor strength    Drug abuse, opioid type (HCC)    Dyspnea    Elevated liver enzymes    Erectile dysfunction    Fibromyalgia    GERD (gastroesophageal reflux disease)    H/O ETOH abuse    Headache    High cholesterol    History of 2019 novel coronavirus disease (COVID-19) 11/01/2021   History of transient cerebral ischemia    Homicidal ideation    Hypertension    Hypokalemia    Living accommodation issues    a.) as of 03/29/2021 --> living in camper   Lower back pain    Lumbar canal stenosis    Marijuana use    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   Pneumonia    Presence of permanent cardiac pacemaker    S/P TKR (total knee replacement)    Schizophrenia (HCC)    Seizures (HCC) 11/2020   Status post hardware removal    Stroke (HCC)    no deficits   Tobacco use    Past Surgical History:   Procedure Laterality Date   ANTERIOR CERVICAL DECOMP/DISCECTOMY FUSION N/A 01/13/2021   Procedure: C3-5 ANTERIOR CERVICAL DECOMPRESSION/DISCECTOMY FUSION;  Surgeon: Venetia Night, MD;  Location: [facility] ORS;  Service: Neurosurgery;  Laterality: N/A;   CERVICAL SPINE SURGERY     4   CLAVICLE SURGERY     COLONOSCOPY WITH PROPOFOL N/A 06/29/2022   Procedure: COLONOSCOPY WITH PROPOFOL;  Surgeon: Wyline Mood, MD;  Location[facility] ENDOSCOPY;  Service: Gastroenterology;  Laterality: N/A;  USES MED TRANS; WILL NEED TIME ON FRIDAY, 06/24/2022   HARDWARE REMOVAL Left 04/20/2015   Procedure: HARDWARE REMOVAL left leg;  Surgeon: Deeann Saint, MD;  Location: [facility] ORS;  Service: Orthopedics;  Laterality: Left;   KNEE SURGERY Left    NECK SURGERY     x6   SHOULDER SURGERY Left    TOTAL KNEE ARTHROPLASTY Left 04/12/2021   Procedure: TOTAL KNEE ARTHROPLASTY;  Surgeon: Lyndle Herrlich, MD;  Location: [facility] ORS;  Service: Orthopedics;  Laterality: Left;   ULNAR NERVE TRANSPOSITION Right 11/17/2021   Procedure: SUBCUTANEOUS TRANSPOSITION OF ULNAR NERVE, RIGHT ELBOW;  Surgeon: Christena Flake, MD;  Location: [facility]  ORS;  Service: Orthopedics;  Laterality: Right;   ULNAR NERVE TRANSPOSITION Right 10/04/2022   Procedure: ULNAR NERVE TRANSPOSITION;  Surgeon: Lovenia Kim, MD;  Location: [facility] ORS;  Service: Neurosurgery;  Laterality: Right;  NO BLOCK   Patient Active Problem List   Diagnosis Date Noted   Ulnar neuropathy of right upper extremity 10/05/2022   Hand weakness 10/05/2022   Nerve pain 10/05/2022   Adenomatous polyp of colon 06/29/2022   S/P TKR (total knee replacement) using cement, left 04/12/2021   Cervical myelopathy (HCC) 01/13/2021   Seizure (HCC) 11/26/2020   Abnormal thyroid blood test 04/06/2016   Encounter for screening colonoscopy 01/05/2016   Abnormal weight gain 01/05/2016   Chronic prescription benzodiazepine use 10/19/2015   Elevated liver enzymes 08/20/2015   Medication monitoring  encounter 08/20/2015   Hypokalemia 08/20/2015   Erectile dysfunction 04/22/2015   Status post hardware removal 04/20/2015   Bipolar 2 disorder (HCC) 07/29/2014   Marijuana abuse 07/29/2014   Chronic knee pain 07/29/2014   Bipolar 1 disorder, mixed (HCC)    Drug abuse, opioid type (HCC) 06/28/2013   GERD without esophagitis 10/03/2012   Hypertension goal BP (blood pressure) < 140/90 10/03/2012   Chronic pain associated with significant psychosocial dysfunction 07/17/2012   Lumbar canal stenosis 04/09/2012   Hypercholesterolemia 01/06/2012   Constipation 11/11/2011   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/2010   Decreased motor strength 09/30/2010   Current tobacco use 09/30/2010   H/O transient cerebral ischemia 09/09/2010   Low back pain 08/30/2010   Cervical spinal cord compression (HCC) 07/22/2010   Cervical post-laminectomy syndrome 07/22/2010   Chronic obstructive pulmonary disease (HCC) 02/23/2010   ONSET DATE: 10/04/22 (most recent sx)  REFERRING DIAG: G56.21 (ICD-10-CM) - Ulnar neuropathy of right upper extremity   THERAPY DIAG:  Muscle weakness (generalized)  Other lack of coordination  Stiffness of right hand, not elsewhere classified  Stiffness of right wrist, not elsewhere classified  Ulnar neuropathy at elbow of right upper extremity  Rationale for Evaluation and Treatment: Rehabilitation  SUBJECTIVE:  SUBJECTIVE STATEMENT: Pt reports his dog bit him (L hand) yesterday.  Bite marks present.  OT reinforced monitoring for infection and ongoing thorough hand hygiene.   Pt accompanied by: self, grandmother  PERTINENT HISTORY:  Per note from Dr. Katrinka Blazing on 11/16/22: [name] is 2 weeks status post Ulnar nerve decompression, AIN to ulnar nerve transfer, and ulnar nerve peripheral nerve stimulator placement. Given oxycodone on discharge from the hospital.   Per medical record, pt underwent first ulnar nerve transposition on 11/17/21 and participated in OT post  surgery.  2nd ulnar nerve transposition done on 10/04/22.  Pt had nerve stimulator placed but pt reports he does not like how it feels so he does not turn it on.  Pt also with hx of 6 cervical spinal surgeries; see above for additional hx.    PRECAUTIONS: None  RED FLAGS: None   WEIGHT BEARING RESTRICTIONS: No  PAIN: 12/22/22: 2/10 pain, up to 8 with heavier physical demands like yard work Are you having pain? Yes: NPRS scale: 2/10 at rest, with activity 6-7/10 Pain location: ulnar side of R arm extending from elbow to hand Pain description: pins and needles, occasional spasms, numbness Aggravating factors: "my arm doesn't like the cold, increased activity  Relieving factors: heat, rest, Gabapentin  LIVING ENVIRONMENT: Lives with: alone with 2 dogs  PLOF: Independent, works as a Scientist, water quality  PATIENT GOALS: build up the strength in the R arm and hand  NEXT MD VISIT: Nov 1st to Dr. Katrinka Blazing  OBJECTIVE:  Note: Objective measures were completed at Evaluation unless otherwise noted.  HAND DOMINANCE: Right  ADLs: Overall ADLs: Pt reports he always tries to use the R arm as able, but in a limited capacity, and is unable to use the R hand with good accuracy or efficiency. Transfers/ambulation related to ADLs: indep Eating: gross grasp in R hand to hold utensils, difficulty cutting food  Grooming: uses L non-dominant hand to shave  UB Dressing: difficulty with clothing fasteners LB Dressing: difficulty with clothing fasteners, extra time to tie shoe laces  Toileting: uses L non-dominant hand for toilet hygiene Bathing: Pt states that he makes himself use the R hand when bathing but control is limited  FUNCTIONAL OUTCOME MEASURES: FOTO: 50; predicted with 56  UPPER EXTREMITY ROM:     Active ROM Right eval Left eval  Shoulder flexion    Shoulder abduction    Shoulder adduction    Shoulder extension    Shoulder internal rotation    Shoulder external rotation    Elbow flexion     Elbow extension 58 65  Wrist flexion    Wrist extension    Wrist ulnar deviation -30 WNL  Wrist radial deviation WNL WNL  Wrist pronation 90 90  Wrist supination 70 75  (Blank rows = not tested)  Active ROM Right eval Left eval  Thumb MCP (0-60)    Thumb IP (0-80)    Thumb Radial abd/add (0-55)     Thumb Palmar abd/add (0-45)     Thumb Opposition to Small Finger     Index MCP (0-90)     Index PIP (0-100)     Index DIP (0-70)      Long MCP (0-90)      Long PIP (0-100)      Long DIP (0-70)      Ring MCP (0-90)      Ring PIP (0-100)      Ring DIP (0-70)      Little MCP (0-90)      Little PIP (0-100)      Little DIP (0-70)      (Blank rows = not tested)  -R hand digits limited in PIP extension in 4th and 5th digits with neutral wrist (lacking ~25% ext in 4th digit PIP and 50% ext in 5th digit PIP) -limited active digit abd in 4th and 5th digits  -Unable to oppose 4th and 5th digits to thumb   UPPER EXTREMITY MMT:     MMT Right eval Left eval  Shoulder flexion    Shoulder abduction    Shoulder adduction    Shoulder extension    Shoulder internal rotation    Shoulder external rotation    Middle trapezius    Lower trapezius    Elbow flexion 5 5  Elbow extension 5 5  Wrist flexion 4 5  Wrist extension 4+ (simultaneous radial dev) 5  Wrist ulnar deviation 1 5  Wrist radial deviation 5 5  Wrist pronation 5 5  Wrist supination 5 5  (Blank rows = not tested)  HAND FUNCTION: Grip strength: Right: 26 lbs; Left: 95 lbs, Lateral pinch: Right: 7 lbs, Left: 32 lbs, and 3 point pinch: Right: 4 lbs, Left: 27 lbs 12/12/22: R grip 35 lbs; R lateral pinch: 8 lbs: 3 point pinch: 4 lbs  COORDINATION: 9 Hole Peg test: Right: 2 min 39 sec; Left: 26 sec  SENSATION: Light touch: Impaired ; ulnar nerve distribution  EDEMA: very mild edema surrounding incision sites on R forearm and elbow   COGNITION: Overall cognitive status: Within functional limits for tasks  assessed Areas of impairment:  hx of bipolar 1, schizophrenia, and drug and alcohol abuse  OBSERVATIONS:  Pt pleasant, cooperative, and eager to regain strength in his R dominant arm.  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   Note for education/handouts: Pt reports he is limited with his reading and writing ability, but states he will always ask if he doesn't understand something.  TODAY'S TREATMENT:                                                                                                                              DATE: 12/22/22 Neuro re-ed: -Facilitated R hand FMC/dexterity skills, working to scoop and store up to 6 small glass stones ([place] stones) from small dish, store in hand.  Practiced supination of forearm with digit extension to simulate opening hand, and then closing without dropping stones (to simulate accepting/giving coin change from hand) -Facilitated R hand 3 point pinch with practice picking up [place] stones flat side up, progressing to flat side down, from a non-skid surface, and further progressing to table top with non-skid surface removed.    Therapeutic Exercise: -Applied K-tape to promote 4th and 5th digit MP and PIP extension and neutral wrist (pt radially deviates at baseline d/t ulnar neuropathy). -Prolonged passive wrist and digit extension and ulnar deviation stretch performed intermittently between exercises noted below  for tendon lengthening and reducing the occasional spasm; performed with pronated forearm on table top. Facilitated R hand strengthening/flexibility exercises: -Facilitated hand strengthening with use of hand gripper set at 17.9# to remove jumbo pegs from pegboard for 3 trials using R hand. - Worked on pinch strengthening in the right hand for lateral and 3pt. pinch using yellow, red, green, blue, and black resistive clips, clipping pins on/off of a vertical dowel.  Unable to achieve 3 pt pinch  to place or remove black clips.  Intermittent vc to position 3rd digit in line with 2nd digit for correct pinch pattern.    PATIENT EDUCATION: Education details: R hand strength and coordination activities Person educated: Patient Education method: Explanation, demo Education comprehension: verbalized understanding, demonstrated understanding  HOME EXERCISE PROGRAM: Yellow theraputty, wrist/forearm strengthening with 2# dumbbell.  GOALS: Goals reviewed with patient? Yes  SHORT TERM GOALS: Target date: 01/02/23  Pt will be indep to perform HEP for improving distal RUE flexibility, strength, and coordination. Baseline: Eval: Not yet initiated Goal status: INITIAL  LONG TERM GOALS: Target date: 02/13/23  Pt will increase FOTO score to 56 or better to indicate improvement in self perceived functional use of the R arm with daily tasks. Baseline: Eval: 50 Goal status: INITIAL  2.  Pt will increase R grip strength by 10 or more lbs to ease ability carry heavy grocery bags in R dominant hand. Baseline:  Eval: R grip 26 lbs (non-dominant L 95 lbs) Goal status: INITIAL  3.  Pt will increase R lateral pinch strength by 5 or more lbs to ease ability to open drink bottles. Baseline: Eval: R 7 lbs (non-dominant L 32 lbs) Goal status: INITIAL  4.  Pt will tolerate manual therapy, therapeutic modalities, and exercises to decrease pain in RUE to a reported 4/10 pain or less with activity.   Baseline: Eval: R forearm and hand 6-7/10 pain with activity  Goal status: INITIAL  5.  Pt will increase R hand FMC/dexterity skills to improve efficiency with clothing fasteners as noted by completion of 9 hole peg test in <2 min.  Baseline: Eval: 2 min 39 sec; extra time clothing fasteners, specifically buttons and tying shoe laces. Goal status: INITIAL  ASSESSMENT:  CLINICAL IMPRESSION: Pt continues to benefit from K-Tape to promote optimal wrist positioning and digit extension - reports he has  purchased some tape but is unable to cut it on his own. Had planned to bring in for OT assistance for pre-cutting strips, but pt forgot this session. Pt tolerated increased resistance of hand gripper to 17.9# for all 3 trials to remove jumbo pegs from pegboard.  Pt improving with ability to pick up and store small items in hand, noting improved closure of SF to palm, though closure is not tight, causing frequent dropping of items from ulnar side of hand.  Pt able to store up to 6 small stones in hand with occasional dropping.  Pt will continue to benefit from skilled OT to address RUE weakness, coordination deficits, stiffness, and pain in the RUE, in order to work towards increased functional use of the R arm for ADL/IADL/work tasks.    PERFORMANCE DEFICITS: in functional skills including ADLs, IADLs, coordination, dexterity, sensation, edema, ROM, strength, pain, fascial restrictions, muscle spasms, flexibility, Fine motor control, body mechanics, decreased knowledge of use of DME, skin integrity, and UE functional use, and psychosocial skills including coping strategies, environmental adaptation, habits, and routines and behaviors.   IMPAIRMENTS: are limiting patient from ADLs, IADLs, rest and sleep, work, and leisure.   COMORBIDITIES: has co-morbidities such as hx of 6 cervical spinal surgeries, bipolar 1, schizophrenia  that affects occupational performance. Patient will benefit from skilled OT to address above impairments and improve overall function.  MODIFICATION OR ASSISTANCE TO COMPLETE EVALUATION: No modification of tasks or assist necessary to complete an evaluation.  OT OCCUPATIONAL PROFILE AND HISTORY: Problem focused assessment: Including review of records relating to presenting problem.  CLINICAL DECISION MAKING: Moderate - several treatment options, min-mod task modification necessary  REHAB POTENTIAL: Good  EVALUATION COMPLEXITY: Moderate      PLAN:  OT FREQUENCY:  2x/week  OT DURATION: 12 weeks  PLANNED INTERVENTIONS: self care/ADL training, therapeutic exercise, therapeutic activity, neuromuscular re-education, manual therapy, scar mobilization, passive range of motion, splinting, electrical stimulation, paraffin, moist heat, cryotherapy, contrast bath, patient/family education, cognitive remediation/compensation, psychosocial skills training, coping strategies training, and DME and/or AE instructions  RECOMMENDED OTHER SERVICES: None at this time  CONSULTED AND AGREED WITH PLAN OF CARE: Patient  PLAN FOR NEXT SESSION: see above  Danelle Earthly, MS, OTR/L  Otis Dials, OT 12/23/2022, 8:27 AM

## 2022-12-26 ENCOUNTER — Encounter: Payer: Self-pay | Admitting: Orthopedic Surgery

## 2022-12-26 ENCOUNTER — Ambulatory Visit: Payer: MEDICAID

## 2022-12-26 ENCOUNTER — Ambulatory Visit (INDEPENDENT_AMBULATORY_CARE_PROVIDER_SITE_OTHER): Payer: MEDICAID | Admitting: Orthopedic Surgery

## 2022-12-26 VITALS — BP 138/88 | Ht 73.0 in | Wt 231.0 lb

## 2022-12-26 DIAGNOSIS — G5621 Lesion of ulnar nerve, right upper limb: Secondary | ICD-10-CM

## 2022-12-26 DIAGNOSIS — M6281 Muscle weakness (generalized): Secondary | ICD-10-CM

## 2022-12-26 DIAGNOSIS — M47812 Spondylosis without myelopathy or radiculopathy, cervical region: Secondary | ICD-10-CM

## 2022-12-26 DIAGNOSIS — M25641 Stiffness of right hand, not elsewhere classified: Secondary | ICD-10-CM

## 2022-12-26 DIAGNOSIS — M25631 Stiffness of right wrist, not elsewhere classified: Secondary | ICD-10-CM

## 2022-12-26 DIAGNOSIS — Z981 Arthrodesis status: Secondary | ICD-10-CM

## 2022-12-26 DIAGNOSIS — R278 Other lack of coordination: Secondary | ICD-10-CM

## 2022-12-26 NOTE — Therapy (Signed)
OUTPATIENT OCCUPATIONAL THERAPY ORTHO PROGRESS AND TREATMENT NOTE Reporting period beginning 11/21/22-12/26/22  Patient Name: Scott Howell MRN: [id number] DOB:02/16/1970, 52 y.o., male Today's Date: 12/26/2022  PCP: Dr. Margarita Mail REFERRING PROVIDER: Dr. Ernestine Mcmurray   END OF SESSION:  OT End of Session - 12/26/22 1453     Visit Number 10    Number of Visits 24    Date for OT Re-Evaluation 02/13/23    Progress Note Due on Visit 10    OT Start Time 1400    OT Stop Time 1445    OT Time Calculation (min) 45 min    Activity Tolerance Patient tolerated treatment well    Behavior During Therapy WFL for tasks assessed/performed            Past Medical History:  Diagnosis Date   Adenomatous polyp of colon    Arthritis    Asthma    Benign fibroma of prostate    Bipolar 1 disorder (HCC)    Cervical myelopathy (HCC)    Cervical post-laminectomy syndrome    Cervical spinal cord compression (HCC)    Chronic knee pain    Chronic pain associated with significant psychosocial dysfunction    Chronic prescription benzodiazepine use    Constipation    COPD (chronic obstructive pulmonary disease) (HCC)    Decreased motor strength    Drug abuse, opioid type (HCC)    Dyspnea    Elevated liver enzymes    Erectile dysfunction    Fibromyalgia    GERD (gastroesophageal reflux disease)    H/O ETOH abuse    Headache    High cholesterol    History of 2019 novel coronavirus disease (COVID-19) 11/01/2021   History of transient cerebral ischemia    Homicidal ideation    Hypertension    Hypokalemia    Living accommodation issues    a.) as of 03/29/2021 --> living in camper   Lower back pain    Lumbar canal stenosis    Marijuana use    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   Pneumonia    Presence of permanent cardiac pacemaker    S/P TKR (total knee replacement)    Schizophrenia (HCC)    Seizures (HCC) 11/2020   Status post hardware removal    Stroke (HCC)     no deficits   Tobacco use    Past Surgical History:  Procedure Laterality Date   ANTERIOR CERVICAL DECOMP/DISCECTOMY FUSION N/A 01/13/2021   Procedure: C3-5 ANTERIOR CERVICAL DECOMPRESSION/DISCECTOMY FUSION;  Surgeon: Venetia Night, MD;  Location: [facility];  Service: Neurosurgery;  Laterality: N/A;   CERVICAL SPINE SURGERY     4   CLAVICLE SURGERY     COLONOSCOPY WITH PROPOFOL N/A 06/29/2022   Procedure: COLONOSCOPY WITH PROPOFOL;  Surgeon: Wyline Mood, MD;  [facility];  Service: Gastroenterology;  Laterality: N/A;  USES MED TRANS; WILL NEED TIME ON FRIDAY, 06/24/2022   HARDWARE REMOVAL Left 04/20/2015   Procedure: HARDWARE REMOVAL left leg;  Surgeon: Deeann Saint, MD;  Location: [facility];  Service: Orthopedics;  Laterality: Left;   KNEE SURGERY Left    NECK SURGERY     x6   SHOULDER SURGERY Left    TOTAL KNEE ARTHROPLASTY Left 04/12/2021   Procedure: TOTAL KNEE ARTHROPLASTY;  Surgeon: Lyndle Herrlich, MD;  Location: [facility];  Service: Orthopedics;  Laterality: Left;   ULNAR NERVE TRANSPOSITION Right 11/17/2021   Procedure: SUBCUTANEOUS TRANSPOSITION OF ULNAR NERVE, RIGHT ELBOW;  Surgeon: Joice Lofts,  Excell Seltzer, MD;  Location: [facility];  Service: Orthopedics;  Laterality: Right;   ULNAR NERVE TRANSPOSITION Right 10/04/2022   Procedure: ULNAR NERVE TRANSPOSITION;  Surgeon: Lovenia Kim, MD;  Location: [facility];  Service: Neurosurgery;  Laterality: Right;  NO BLOCK   Patient Active Problem List   Diagnosis Date Noted   Ulnar neuropathy of right upper extremity 10/05/2022   Hand weakness 10/05/2022   Nerve pain 10/05/2022   Adenomatous polyp of colon 06/29/2022   S/P TKR (total knee replacement) using cement, left 04/12/2021   Cervical myelopathy (HCC) 01/13/2021   Seizure (HCC) 11/26/2020   Abnormal thyroid blood test 04/06/2016   Encounter for screening colonoscopy 01/05/2016   Abnormal weight gain 01/05/2016   Chronic prescription benzodiazepine use 10/19/2015    Elevated liver enzymes 08/20/2015   Medication monitoring encounter 08/20/2015   Hypokalemia 08/20/2015   Erectile dysfunction 04/22/2015   Status post hardware removal 04/20/2015   Bipolar 2 disorder (HCC) 07/29/2014   Marijuana abuse 07/29/2014   Chronic knee pain 07/29/2014   Bipolar 1 disorder, mixed (HCC)    Drug abuse, opioid type (HCC) 06/28/2013   GERD without esophagitis 10/03/2012   Hypertension goal BP (blood pressure) < 140/90 10/03/2012   Chronic pain associated with significant psychosocial dysfunction 07/17/2012   Lumbar canal stenosis 04/09/2012   Hypercholesterolemia 01/06/2012   Constipation 11/11/2011   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/2010   Decreased motor strength 09/30/2010   Current tobacco use 09/30/2010   H/O transient cerebral ischemia 09/09/2010   Low back pain 08/30/2010   Cervical spinal cord compression (HCC) 07/22/2010   Cervical post-laminectomy syndrome 07/22/2010   Chronic obstructive pulmonary disease (HCC) 02/23/2010   ONSET DATE: 10/04/22 (most recent sx)  REFERRING DIAG: G56.21 (ICD-10-CM) - Ulnar neuropathy of right upper extremity   THERAPY DIAG:  Muscle weakness (generalized)  Other lack of coordination  Stiffness of right hand, not elsewhere classified  Stiffness of right wrist, not elsewhere classified  Ulnar neuropathy at elbow of right upper extremity  Rationale for Evaluation and Treatment: Rehabilitation  SUBJECTIVE:  SUBJECTIVE STATEMENT: Pt reports his R hand is more fatigued today after increased use with a tool over the weekend.  Pt reported that he duct taped a tool to his hand to prevent dropping the tool, which helped him to do his work, but reports increased soreness and fatigue in the hand following this.  Pt accompanied by: self, grandmother  PERTINENT HISTORY:  Per note from Dr. Katrinka Blazing on 11/16/22: [name] is 2 weeks status post Ulnar nerve decompression, AIN to ulnar nerve transfer, and ulnar nerve  peripheral nerve stimulator placement. Given oxycodone on discharge from the hospital.   Per medical record, pt underwent first ulnar nerve transposition on 11/17/21 and participated in OT post surgery.  2nd ulnar nerve transposition done on 10/04/22.  Pt had nerve stimulator placed but pt reports he does not like how it feels so he does not turn it on.  Pt also with hx of 6 cervical spinal surgeries; see above for additional hx.    PRECAUTIONS: None  RED FLAGS: None   WEIGHT BEARING RESTRICTIONS: No  PAIN: 12/26/22: R arm at rest 2/10, with activity 6-7/10 pain  Are you having pain? Yes: NPRS scale: 2/10 at rest, with activity 6-7/10 Pain location: ulnar side of R arm extending from elbow to hand Pain description: pins and needles, occasional spasms, numbness Aggravating factors: "my arm doesn't like the cold, increased activity  Relieving factors: heat, rest,  Gabapentin  LIVING ENVIRONMENT: Lives with: alone with 2 dogs  PLOF: Independent, works as a Scientist, water quality  PATIENT GOALS: build up the strength in the R arm and hand  NEXT MD VISIT: Nov 1st to Dr. Katrinka Blazing  OBJECTIVE:  Note: Objective measures were completed at Evaluation unless otherwise noted.  HAND DOMINANCE: Right  ADLs: Overall ADLs: Pt reports he always tries to use the R arm as able, but in a limited capacity, and is unable to use the R hand with good accuracy or efficiency. Transfers/ambulation related to ADLs: indep Eating: gross grasp in R hand to hold utensils, difficulty cutting food  Grooming: uses L non-dominant hand to shave  UB Dressing: difficulty with clothing fasteners LB Dressing: difficulty with clothing fasteners, extra time to tie shoe laces  Toileting: uses L non-dominant hand for toilet hygiene Bathing: Pt states that he makes himself use the R hand when bathing but control is limited  FUNCTIONAL OUTCOME MEASURES: FOTO: 50; predicted with 56 12/26/22: FOTO: 49  UPPER EXTREMITY ROM:     Active  ROM Left eval Right eval Right 12/26/22  Shoulder flexion     Shoulder abduction     Shoulder adduction     Shoulder extension     Shoulder internal rotation     Shoulder external rotation     Elbow flexion     Elbow extension     Wrist flexion     Wrist extension 65 58 60  Wrist ulnar deviation WNL -30 -30  Wrist radial deviation WNL WNL WNL  Wrist pronation 90 90 90  Wrist supination 75 70 75  (Blank rows = not tested)  Active ROM Right eval Left eval  Thumb MCP (0-60)    Thumb IP (0-80)    Thumb Radial abd/add (0-55)     Thumb Palmar abd/add (0-45)     Thumb Opposition to Small Finger     Index MCP (0-90)     Index PIP (0-100)     Index DIP (0-70)      Long MCP (0-90)      Long PIP (0-100)      Long DIP (0-70)      Ring MCP (0-90)      Ring PIP (0-100)      Ring DIP (0-70)      Little MCP (0-90)      Little PIP (0-100)      Little DIP (0-70)      (Blank rows = not tested)  Eval: R hand digits limited in PIP extension in 4th and 5th digits with neutral wrist (lacking ~25% ext in 4th digit PIP and 50% ext in 5th digit PIP) 12/26/22: R hand lacking ~10% ext in 4th digit PIP and lacking ~30% ext in 5th digit PIP  Eval: -limited active digit abd in 4th and 5th digits 12/26/22: able to achieve slight digit abduction in 4th and 5th digits when compensating with wrist and MP flexion    Eval: -Unable to oppose R 4th and 5th digits to thumb  12/26/22: unable to oppose R 4th and 5th digits to thumb  UPPER EXTREMITY MMT:     MMT Left eval Right eval Right 12/26/22  Shoulder flexion     Shoulder abduction     Shoulder adduction     Shoulder extension     Shoulder internal rotation     Shoulder external rotation     Middle trapezius     Lower trapezius     Elbow  flexion 5 5   Elbow extension 5 5   Wrist flexion 5 4 4+  Wrist extension 5 4+ (simultaneous radial dev) 4+ (slight radial dev)  Wrist ulnar deviation 5 1   Wrist radial deviation 5 5   Wrist  pronation 5 5   Wrist supination 5 5   (Blank rows = not tested)  HAND FUNCTION: Grip strength: Right: 26 lbs; Left: 95 lbs, Lateral pinch: Right: 7 lbs, Left: 32 lbs, and 3 point pinch: Right: 4 lbs, Left: 27 lbs 12/12/22: R grip 35 lbs; R lateral pinch: 8 lbs: 3 point pinch: 4 lbs  12/26/22: R grip 33 lbs; R lateral pinch: 8 lbs; 3 point pinch: 4 lbs (Pt reports increased fatigue in R hand today following extensive use of the hand over the weekend when using a tool)  COORDINATION: 9 Hole Peg test: Right: 2 min 39 sec; Left: 26 sec 12/26/22: R 48 secs  SENSATION: Light touch: Impaired ; ulnar nerve distribution  EDEMA: very mild edema surrounding incision sites on R forearm and elbow   COGNITION: Overall cognitive status: Within functional limits for tasks assessed Areas of impairment:  hx of bipolar 1, schizophrenia, and drug and alcohol abuse  OBSERVATIONS:  Pt pleasant, cooperative, and eager to regain strength in his R dominant arm.  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   Note for education/handouts: Pt reports he is limited with his reading and writing ability, but states he will always ask if he doesn't understand something.  TODAY'S TREATMENT:                                                                                                                              DATE: 12/22/22 Neuro re-ed: -Facilitated R hand FMC/dexterity skills, working to scoop and store up to 6 small glass stones ([place] stones) from small dish, store in hand.  Practiced supination of forearm with digit extension to simulate opening hand, and then closing without dropping stones (to simulate accepting/giving coin change from hand) -Facilitated R hand 3 point pinch with practice picking up [place] stones flat side up, progressing to flat side down, without a non-skid surface.  Therapeutic Exercise: -Objective measures taken and goals updated for  progress note. -Applied K-tape to promote 4th and 5th digit MP and PIP extension and neutral wrist (pt radially deviates at baseline d/t ulnar neuropathy). -Prolonged passive wrist and digit extension and ulnar deviation stretch performed intermittently between exercises noted below  for tendon lengthening and reducing the occasional spasm; performed with pronated forearm on table top. Facilitated R hand strengthening/flexibility exercises: -Facilitated hand strengthening with use of hand gripper set at 17.9# to place and remove jumbo pegs from pegboard for 1 trial using R hand (limited to 1 trial this date d/t time constraints with taking objective measures for progress note).  PATIENT EDUCATION: Education details: Progress towards goals Person educated: Patient Education method: Explanation  Education comprehension: verbalized understanding  HOME EXERCISE PROGRAM: Yellow theraputty, wrist/forearm strengthening with 2# dumbbell.  GOALS: Goals reviewed with patient? Yes  SHORT TERM GOALS: Target date: 01/02/23  Pt will be indep to perform HEP for improving distal RUE flexibility, strength, and coordination. Baseline: Eval: Not yet initiated; 12/26/22: pt routinely works on resistive hand strengthening exercises with tools and putty at home, as well as stretching wrist and digits, and FMC activities.  Goal status: achieved  LONG TERM GOALS: Target date: 02/13/23  Pt will increase FOTO score to 56 or better to indicate improvement in self perceived functional use of the R arm with daily tasks. Baseline: Eval: 50; 12/26/22: 49 Goal status: ongoing  2.  Pt will increase R grip strength by 10 or more lbs to ease ability carry heavy grocery bags in R dominant hand. Baseline: Eval: R grip 26 lbs (non-dominant L 95 lbs); 12/26/22: R grip 33 lbs Goal status: ongoing  3.  Pt will increase R lateral pinch strength by 5 or more lbs to ease ability to open drink bottles. Baseline: Eval: R 7 lbs  (non-dominant L 32 lbs); 12/26/22: R 8 lbs Goal status: ongoing  4.  Pt will tolerate manual therapy, therapeutic modalities, and exercises to decrease pain in RUE to a reported 4/10 pain or less with activity.   Baseline: Eval: R forearm and hand 6-7/10 pain with activity; 12/26/22: Pt reports increased pain today after increased use of R hand over the weekend; still at 6-7/10 pain with activity. Goal status: ongoing  5.  Pt will increase R hand FMC/dexterity skills to improve efficiency with clothing fasteners as noted by completion of 9 hole peg test in <40 sec (revised on 12/26/22 from <2 min).  Baseline: Eval: 2 min 39 sec; extra time clothing fasteners, specifically buttons and tying shoe laces; 12/26/22: R 48 sec  Goal status: achieved/revised/ongoing  ASSESSMENT: CLINICAL IMPRESSION: Pt seen for 10th visit progress update.  No significant change in FOTO score this period, despite pt verbalizing that he is now attempting to engage the R hand in everything he does, where prior to eval, pt verbalized using L non-dominant hand for all daily tasks.  Pt reports his R hand is more fatigued today after increased use with a tool over the weekend.  Pt reported that he duct taped a tool to his hand to prevent dropping the tool, which helped him to do his work, but reports increased soreness and fatigue in the R hand following this.  Pt has made excellent gains with R [facility] skills, reducing 9 hole peg test score from 2 min 39 sec at eval to 48 sec this visit.  Once pt picks up a peg, however, pt lacks the dexterity to rotate pegs within fingertips in prep to place them into pegboard, but rather rotates forearm to adjust positioning of peg from horizontal to vertical.  Pt also shows improvements in R wrist strength, active digit extension in the ulnar digits, active supination, and increased R grip strength by 7 lbs, though still significantly limited as compared to non-dominant hand (see above).  Pt continues  to benefit from K-Tape to promote optimal wrist positioning and digit extension - reports he has purchased some tape but is unable to cut it on his own.  Pt will continue to benefit from skilled OT to address RUE weakness, coordination deficits, stiffness, and pain in the RUE, in order to work towards increased functional use of the R arm for ADL/IADL/work tasks.  PERFORMANCE DEFICITS: in functional skills including ADLs, IADLs, coordination, dexterity, sensation, edema, ROM, strength, pain, fascial restrictions, muscle spasms, flexibility, Fine motor control, body mechanics, decreased knowledge of use of DME, skin integrity, and UE functional use, and psychosocial skills including coping strategies, environmental adaptation, habits, and routines and behaviors.   IMPAIRMENTS: are limiting patient from ADLs, IADLs, rest and sleep, work, and leisure.   COMORBIDITIES: has co-morbidities such as hx of 6 cervical spinal surgeries, bipolar 1, schizophrenia  that affects occupational performance. Patient will benefit from skilled OT to address above impairments and improve overall function.  MODIFICATION OR ASSISTANCE TO COMPLETE EVALUATION: No modification of tasks or assist necessary to complete an evaluation.  OT OCCUPATIONAL PROFILE AND HISTORY: Problem focused assessment: Including review of records relating to presenting problem.  CLINICAL DECISION MAKING: Moderate - several treatment options, min-mod task modification necessary  REHAB POTENTIAL: Good  EVALUATION COMPLEXITY: Moderate      PLAN:  OT FREQUENCY: 2x/week  OT DURATION: 12 weeks  PLANNED INTERVENTIONS: self care/ADL training, therapeutic exercise, therapeutic activity, neuromuscular re-education, manual therapy, scar mobilization, passive range of motion, splinting, electrical stimulation, paraffin, moist heat, cryotherapy, contrast bath, patient/family education, cognitive remediation/compensation, psychosocial skills  training, coping strategies training, and DME and/or AE instructions  RECOMMENDED OTHER SERVICES: None at this time  CONSULTED AND AGREED WITH PLAN OF CARE: Patient  PLAN FOR NEXT SESSION: see above  Danelle Earthly, MS, OTR/L  Otis Dials, OT 12/26/2022, 2:55 PM

## 2022-12-28 ENCOUNTER — Ambulatory Visit: Payer: MEDICAID

## 2022-12-28 DIAGNOSIS — M6281 Muscle weakness (generalized): Secondary | ICD-10-CM

## 2022-12-28 DIAGNOSIS — G5621 Lesion of ulnar nerve, right upper limb: Secondary | ICD-10-CM | POA: Diagnosis not present

## 2022-12-28 DIAGNOSIS — M25641 Stiffness of right hand, not elsewhere classified: Secondary | ICD-10-CM

## 2022-12-28 DIAGNOSIS — R278 Other lack of coordination: Secondary | ICD-10-CM

## 2022-12-28 DIAGNOSIS — M25631 Stiffness of right wrist, not elsewhere classified: Secondary | ICD-10-CM

## 2023-01-01 NOTE — Therapy (Signed)
OUTPATIENT OCCUPATIONAL THERAPY ORTHO TREATMENT NOTE  Patient Name: Scott Howell MRN: [id number] DOB:[birth year], 52 y.o., male Today's Date: 01/01/2023  PCP: Dr. Margarita Mail REFERRING PROVIDER: Dr. Ernestine Mcmurray   END OF SESSION:  OT End of Session - 01/01/23 1115     Visit Number 11    Number of Visits 24    Date for OT Re-Evaluation 02/13/23    Progress Note Due on Visit 10    OT Start Time 1405    OT Stop Time 1450    OT Time Calculation (min) 45 min    Activity Tolerance Patient tolerated treatment well    Behavior During Therapy WFL for tasks assessed/performed            Past Medical History:  Diagnosis Date   Adenomatous polyp of colon    Arthritis    Asthma    Benign fibroma of prostate    Bipolar 1 disorder (HCC)    Cervical myelopathy (HCC)    Cervical post-laminectomy syndrome    Cervical spinal cord compression (HCC)    Chronic knee pain    Chronic pain associated with significant psychosocial dysfunction    Chronic prescription benzodiazepine use    Constipation    COPD (chronic obstructive pulmonary disease) (HCC)    Decreased motor strength    Drug abuse, opioid type (HCC)    Dyspnea    Elevated liver enzymes    Erectile dysfunction    Fibromyalgia    GERD (gastroesophageal reflux disease)    H/O ETOH abuse    Headache    High cholesterol    History of 2019 novel coronavirus disease (COVID-19) 11/01/2021   History of transient cerebral ischemia    Homicidal ideation    Hypertension    Hypokalemia    Living accommodation issues    a.) as of 03/29/2021 --> living in camper   Lower back pain    Lumbar canal stenosis    Marijuana use    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   Pneumonia    Presence of permanent cardiac pacemaker    S/P TKR (total knee replacement)    Schizophrenia (HCC)    Seizures (HCC) 11/2020   Status post hardware removal    Stroke (HCC)    no deficits   Tobacco use    Past Surgical History:   Procedure Laterality Date   ANTERIOR CERVICAL DECOMP/DISCECTOMY FUSION N/A 01/13/2021   Procedure: C3-5 ANTERIOR CERVICAL DECOMPRESSION/DISCECTOMY FUSION;  Surgeon: Venetia Night, MD;  Location: [facility] ORS;  Service: Neurosurgery;  Laterality: N/A;   CERVICAL SPINE SURGERY     4   CLAVICLE SURGERY     COLONOSCOPY WITH PROPOFOL N/A 06/29/2022   Procedure: COLONOSCOPY WITH PROPOFOL;  Surgeon: Wyline Mood, MD;  Location: [facility] ENDOSCOPY;  Service: Gastroenterology;  Laterality: N/A;  USES MED TRANS; WILL NEED TIME ON FRIDAY, 06/24/2022   HARDWARE REMOVAL Left 04/20/2015   Procedure: HARDWARE REMOVAL left leg;  Surgeon: Deeann Saint, MD;  Location: [facility] ORS;  Service: Orthopedics;  Laterality: Left;   KNEE SURGERY Left    NECK SURGERY     x6   SHOULDER SURGERY Left    TOTAL KNEE ARTHROPLASTY Left 04/12/2021   Procedure: TOTAL KNEE ARTHROPLASTY;  Surgeon: Lyndle Herrlich, MD;  Location: [facility] ORS;  Service: Orthopedics;  Laterality: Left;   ULNAR NERVE TRANSPOSITION Right 11/17/2021   Procedure: SUBCUTANEOUS TRANSPOSITION OF ULNAR NERVE, RIGHT ELBOW;  Surgeon: Christena Flake, MD;  Location: [facility]  ORS;  Service: Orthopedics;  Laterality: Right;   ULNAR NERVE TRANSPOSITION Right 10/04/2022   Procedure: ULNAR NERVE TRANSPOSITION;  Surgeon: Lovenia Kim, MD;  Location: [facility] ORS;  Service: Neurosurgery;  Laterality: Right;  NO BLOCK   Patient Active Problem List   Diagnosis Date Noted   Ulnar neuropathy of right upper extremity 10/05/2022   Hand weakness 10/05/2022   Nerve pain 10/05/2022   Adenomatous polyp of colon 06/29/2022   S/P TKR (total knee replacement) using cement, left 04/12/2021   Cervical myelopathy (HCC) 01/13/2021   Seizure (HCC) 11/26/2020   Abnormal thyroid blood test 04/06/2016   Encounter for screening colonoscopy 01/05/2016   Abnormal weight gain 01/05/2016   Chronic prescription benzodiazepine use 10/19/2015   Elevated liver enzymes 08/20/2015   Medication monitoring  encounter 08/20/2015   Hypokalemia 08/20/2015   Erectile dysfunction 04/22/2015   Status post hardware removal 04/20/2015   Bipolar 2 disorder (HCC) 07/29/2014   Marijuana abuse 07/29/2014   Chronic knee pain 07/29/2014   Bipolar 1 disorder, mixed (HCC)    Drug abuse, opioid type (HCC) 06/28/2013   GERD without esophagitis 10/03/2012   Hypertension goal BP (blood pressure) < 140/90 10/03/2012   Chronic pain associated with significant psychosocial dysfunction 07/17/2012   Lumbar canal stenosis 04/09/2012   Hypercholesterolemia 01/06/2012   Constipation 11/11/2011   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/2010   Decreased motor strength 09/30/2010   Current tobacco use 09/30/2010   H/O transient cerebral ischemia 09/09/2010   Low back pain 08/30/2010   Cervical spinal cord compression (HCC) 07/22/2010   Cervical post-laminectomy syndrome 07/22/2010   Chronic obstructive pulmonary disease (HCC) 02/23/2010   ONSET DATE: 10/04/22 (most recent sx)  REFERRING DIAG: G56.21 (ICD-10-CM) - Ulnar neuropathy of right upper extremity   THERAPY DIAG:  Muscle weakness (generalized)  Other lack of coordination  Stiffness of right wrist, not elsewhere classified  Ulnar neuropathy at elbow of right upper extremity  Stiffness of right hand, not elsewhere classified  Rationale for Evaluation and Treatment: Rehabilitation  SUBJECTIVE:  SUBJECTIVE STATEMENT: Pt reports doing well today. Pt accompanied by: self, grandmother  PERTINENT HISTORY:  Per note from Dr. Katrinka Blazing on 11/16/22: [name] is 2 weeks status post Ulnar nerve decompression, AIN to ulnar nerve transfer, and ulnar nerve peripheral nerve stimulator placement. Given oxycodone on discharge from the hospital.   Per medical record, pt underwent first ulnar nerve transposition on 11/17/21 and participated in OT post surgery.  2nd ulnar nerve transposition done on 10/04/22.  Pt had nerve stimulator placed but pt reports he does not like  how it feels so he does not turn it on.  Pt also with hx of 6 cervical spinal surgeries; see above for additional hx.    PRECAUTIONS: None  RED FLAGS: None   WEIGHT BEARING RESTRICTIONS: No  PAIN: 12/28/22: R arm at rest 2/10, with activity 6-7/10 pain  Are you having pain? Yes: NPRS scale: 2/10 at rest, with activity 6-7/10 Pain location: ulnar side of R arm extending from elbow to hand Pain description: pins and needles, occasional spasms, numbness Aggravating factors: "my arm doesn't like the cold, increased activity  Relieving factors: heat, rest, Gabapentin  LIVING ENVIRONMENT: Lives with: alone with 2 dogs  PLOF: Independent, works as a Scientist, water quality  PATIENT GOALS: build up the strength in the R arm and hand  NEXT MD VISIT: Nov 1st to Dr. Katrinka Blazing  OBJECTIVE:  Note: Objective measures were completed at Evaluation unless otherwise noted.  HAND  DOMINANCE: Right  ADLs: Overall ADLs: Pt reports he always tries to use the R arm as able, but in a limited capacity, and is unable to use the R hand with good accuracy or efficiency. Transfers/ambulation related to ADLs: indep Eating: gross grasp in R hand to hold utensils, difficulty cutting food  Grooming: uses L non-dominant hand to shave  UB Dressing: difficulty with clothing fasteners LB Dressing: difficulty with clothing fasteners, extra time to tie shoe laces  Toileting: uses L non-dominant hand for toilet hygiene Bathing: Pt states that he makes himself use the R hand when bathing but control is limited  FUNCTIONAL OUTCOME MEASURES: FOTO: 50; predicted with 56 12/26/22: FOTO: 49  UPPER EXTREMITY ROM:     Active ROM Left eval Right eval Right 12/26/22  Shoulder flexion     Shoulder abduction     Shoulder adduction     Shoulder extension     Shoulder internal rotation     Shoulder external rotation     Elbow flexion     Elbow extension     Wrist flexion     Wrist extension 65 58 60  Wrist ulnar deviation WNL  -30 -30  Wrist radial deviation WNL WNL WNL  Wrist pronation 90 90 90  Wrist supination 75 70 75  (Blank rows = not tested)  Active ROM Right eval Left eval  Thumb MCP (0-60)    Thumb IP (0-80)    Thumb Radial abd/add (0-55)     Thumb Palmar abd/add (0-45)     Thumb Opposition to Small Finger     Index MCP (0-90)     Index PIP (0-100)     Index DIP (0-70)      Long MCP (0-90)      Long PIP (0-100)      Long DIP (0-70)      Ring MCP (0-90)      Ring PIP (0-100)      Ring DIP (0-70)      Little MCP (0-90)      Little PIP (0-100)      Little DIP (0-70)      (Blank rows = not tested)  Eval: R hand digits limited in PIP extension in 4th and 5th digits with neutral wrist (lacking ~25% ext in 4th digit PIP and 50% ext in 5th digit PIP) 12/26/22: R hand lacking ~10% ext in 4th digit PIP and lacking ~30% ext in 5th digit PIP  Eval: -limited active digit abd in 4th and 5th digits 12/26/22: able to achieve slight digit abduction in 4th and 5th digits when compensating with wrist and MP flexion    Eval: -Unable to oppose R 4th and 5th digits to thumb  12/26/22: unable to oppose R 4th and 5th digits to thumb  UPPER EXTREMITY MMT:     MMT Left eval Right eval Right 12/26/22  Shoulder flexion     Shoulder abduction     Shoulder adduction     Shoulder extension     Shoulder internal rotation     Shoulder external rotation     Middle trapezius     Lower trapezius     Elbow flexion 5 5   Elbow extension 5 5   Wrist flexion 5 4 4+  Wrist extension 5 4+ (simultaneous radial dev) 4+ (slight radial dev)  Wrist ulnar deviation 5 1   Wrist radial deviation 5 5   Wrist pronation 5 5   Wrist supination 5 5   (  Blank rows = not tested)  HAND FUNCTION: Grip strength: Right: 26 lbs; Left: 95 lbs, Lateral pinch: Right: 7 lbs, Left: 32 lbs, and 3 point pinch: Right: 4 lbs, Left: 27 lbs 12/12/22: R grip 35 lbs; R lateral pinch: 8 lbs: 3 point pinch: 4 lbs  12/26/22: R grip 33 lbs; R  lateral pinch: 8 lbs; 3 point pinch: 4 lbs (Pt reports increased fatigue in R hand today following extensive use of the hand over the weekend when using a tool)  COORDINATION: 9 Hole Peg test: Right: 2 min 39 sec; Left: 26 sec 12/26/22: R 48 secs  SENSATION: Light touch: Impaired ; ulnar nerve distribution  EDEMA: very mild edema surrounding incision sites on R forearm and elbow   COGNITION: Overall cognitive status: Within functional limits for tasks assessed Areas of impairment:  hx of bipolar 1, schizophrenia, and drug and alcohol abuse  OBSERVATIONS:  Pt pleasant, cooperative, and eager to regain strength in his R dominant arm.  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   Note for education/handouts: Pt reports he is limited with his reading and writing ability, but states he will always ask if he doesn't understand something.  TODAY'S TREATMENT:                                                                                                                              DATE: 12/28/22 Neuro re-ed: -Facilitated R hand FMC/dexterity skills, working with grooved pegs.  Pt was cued to minimize wrist and forearm mobility in order to isolate [employer] [facility] [employer] when moving pegs from horizontal to vertical position in prep to place peg into pegboard.    Therapeutic Exercise: -Facilitated R hand strengthening/flexibility exercises: -Completed passive stretching for R wrist and digit ext, ulnar deviation -Completed place and hold exercises for R wrist and digit ext x1 set 10 reps  -Completed active assisted digit abd/add x10 reps  Therapeutic Activity: Facilitated R hand strengthening using Puttycize tools with yellow putty using the knob turn, cap turn, peg turn, L-Bar and key turn attachments to target specific components of movements in the hand.  Mod vc and visual demo for prehension patterns with tools.    PATIENT EDUCATION: Education details: Progress  towards goals Person educated: Patient Education method: Explanation Education comprehension: verbalized understanding  HOME EXERCISE PROGRAM: Yellow theraputty, wrist/forearm strengthening with 2# dumbbell.  GOALS: Goals reviewed with patient? Yes  SHORT TERM GOALS: Target date: 01/02/23  Pt will be indep to perform HEP for improving distal RUE flexibility, strength, and coordination. Baseline: Eval: Not yet initiated; 12/26/22: pt routinely works on resistive hand strengthening exercises with tools and putty at home, as well as stretching wrist and digits, and FMC activities.  Goal status: achieved  LONG TERM GOALS: Target date: 02/13/23  Pt will increase FOTO score to 56 or better to indicate improvement in self perceived functional use of the R arm with daily  tasks. Baseline: Eval: 50; 12/26/22: 49 Goal status: ongoing  2.  Pt will increase R grip strength by 10 or more lbs to ease ability carry heavy grocery bags in R dominant hand. Baseline: Eval: R grip 26 lbs (non-dominant L 95 lbs); 12/26/22: R grip 33 lbs Goal status: ongoing  3.  Pt will increase R lateral pinch strength by 5 or more lbs to ease ability to open drink bottles. Baseline: Eval: R 7 lbs (non-dominant L 32 lbs); 12/26/22: R 8 lbs Goal status: ongoing  4.  Pt will tolerate manual therapy, therapeutic modalities, and exercises to decrease pain in RUE to a reported 4/10 pain or less with activity.   Baseline: Eval: R forearm and hand 6-7/10 pain with activity; 12/26/22: Pt reports increased pain today after increased use of R hand over the weekend; still at 6-7/10 pain with activity. Goal status: ongoing  5.  Pt will increase R hand FMC/dexterity skills to improve efficiency with clothing fasteners as noted by completion of 9 hole peg test in <40 sec (revised on 12/26/22 from <2 min).  Baseline: Eval: 2 min 39 sec; extra time clothing fasteners, specifically buttons and tying shoe laces; 12/26/22: R 48 sec   Goal status: achieved/revised/ongoing  ASSESSMENT: CLINICAL IMPRESSION: Pt declined K-tape this date, stating that he doesn't want to rely on the tape anymore.  Good tolerance to activities noted above, with intermittent pauses for an occasional hand cramp, typically brought on by more challenging [facility] activities.  Pt requires cues to allow more frequent rest breaks with these activities to avoid having to stand up and walk around as a result of sudden increases in pain, but pt tends to push himself to this point.  Pt will continue to benefit from skilled OT to address RUE weakness, coordination deficits, stiffness, and pain in the RUE, in order to work towards increased functional use of the R arm for ADL/IADL/work tasks.    PERFORMANCE DEFICITS: in functional skills including ADLs, IADLs, coordination, dexterity, sensation, edema, ROM, strength, pain, fascial restrictions, muscle spasms, flexibility, Fine motor control, body mechanics, decreased knowledge of use of DME, skin integrity, and UE functional use, and psychosocial skills including coping strategies, environmental adaptation, habits, and routines and behaviors.   IMPAIRMENTS: are limiting patient from ADLs, IADLs, rest and sleep, work, and leisure.   COMORBIDITIES: has co-morbidities such as hx of 6 cervical spinal surgeries, bipolar 1, schizophrenia  that affects occupational performance. Patient will benefit from skilled OT to address above impairments and improve overall function.  MODIFICATION OR ASSISTANCE TO COMPLETE EVALUATION: No modification of tasks or assist necessary to complete an evaluation.  OT OCCUPATIONAL PROFILE AND HISTORY: Problem focused assessment: Including review of records relating to presenting problem.  CLINICAL DECISION MAKING: Moderate - several treatment options, min-mod task modification necessary  REHAB POTENTIAL: Good  EVALUATION COMPLEXITY: Moderate      PLAN:  OT FREQUENCY: 2x/week  OT  DURATION: 12 weeks  PLANNED INTERVENTIONS: self care/ADL training, therapeutic exercise, therapeutic activity, neuromuscular re-education, manual therapy, scar mobilization, passive range of motion, splinting, electrical stimulation, paraffin, moist heat, cryotherapy, contrast bath, patient/family education, cognitive remediation/compensation, psychosocial skills training, coping strategies training, and DME and/or AE instructions  RECOMMENDED OTHER SERVICES: None at this time  CONSULTED AND AGREED WITH PLAN OF CARE: Patient  PLAN FOR NEXT SESSION: see above  Danelle Earthly, MS, OTR/L  Otis Dials, OT 01/01/2023, 11:17 AM

## 2023-01-02 ENCOUNTER — Ambulatory Visit: Payer: MEDICAID

## 2023-01-02 DIAGNOSIS — M25641 Stiffness of right hand, not elsewhere classified: Secondary | ICD-10-CM

## 2023-01-02 DIAGNOSIS — R278 Other lack of coordination: Secondary | ICD-10-CM

## 2023-01-02 DIAGNOSIS — M6281 Muscle weakness (generalized): Secondary | ICD-10-CM

## 2023-01-02 DIAGNOSIS — G5621 Lesion of ulnar nerve, right upper limb: Secondary | ICD-10-CM | POA: Diagnosis not present

## 2023-01-02 NOTE — Therapy (Signed)
OUTPATIENT OCCUPATIONAL THERAPY ORTHO TREATMENT NOTE  Patient Name: Scott Howell MRN: [id number] DOB:09/09/1970, 52 y.o., male Today's Date: 01/02/2023  PCP: Dr. Margarita Mail REFERRING PROVIDER: Dr. Ernestine Mcmurray   END OF SESSION:  OT End of Session - 01/02/23 1454     Visit Number 12    Number of Visits 24    Date for OT Re-Evaluation 02/13/23    Progress Note Due on Visit 10    OT Start Time 1400    OT Stop Time 1445    OT Time Calculation (min) 45 min    Activity Tolerance Patient tolerated treatment well    Behavior During Therapy WFL for tasks assessed/performed            Past Medical History:  Diagnosis Date   Adenomatous polyp of colon    Arthritis    Asthma    Benign fibroma of prostate    Bipolar 1 disorder (HCC)    Cervical myelopathy (HCC)    Cervical post-laminectomy syndrome    Cervical spinal cord compression (HCC)    Chronic knee pain    Chronic pain associated with significant psychosocial dysfunction    Chronic prescription benzodiazepine use    Constipation    COPD (chronic obstructive pulmonary disease) (HCC)    Decreased motor strength    Drug abuse, opioid type (HCC)    Dyspnea    Elevated liver enzymes    Erectile dysfunction    Fibromyalgia    GERD (gastroesophageal reflux disease)    H/O ETOH abuse    Headache    High cholesterol    History of 2019 novel coronavirus disease (COVID-19) 11/01/2021   History of transient cerebral ischemia    Homicidal ideation    Hypertension    Hypokalemia    Living accommodation issues    a.) as of 03/29/2021 --> living in camper   Lower back pain    Lumbar canal stenosis    Marijuana use    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   Pneumonia    Presence of permanent cardiac pacemaker    S/P TKR (total knee replacement)    Schizophrenia (HCC)    Seizures (HCC) 11/2020   Status post hardware removal    Stroke (HCC)    no deficits   Tobacco use    Past Surgical History:   Procedure Laterality Date   ANTERIOR CERVICAL DECOMP/DISCECTOMY FUSION N/A 01/13/2021   Procedure: C3-5 ANTERIOR CERVICAL DECOMPRESSION/DISCECTOMY FUSION;  Surgeon: Venetia Night, MD;  Location: [facility] ORS;  Service: Neurosurgery;  Laterality: N/A;   CERVICAL SPINE SURGERY     4   CLAVICLE SURGERY     COLONOSCOPY WITH PROPOFOL N/A 06/29/2022   Procedure: COLONOSCOPY WITH PROPOFOL;  Surgeon: Wyline Mood, MD;  Location: [facility] ENDOSCOPY;  Service: Gastroenterology;  Laterality: N/A;  USES MED TRANS; WILL NEED TIME ON FRIDAY, 06/24/2022   HARDWARE REMOVAL Left 04/20/2015   Procedure: HARDWARE REMOVAL left leg;  Surgeon: Deeann Saint, MD;  Location: [facility] ORS;  Service: Orthopedics;  Laterality: Left;   KNEE SURGERY Left    NECK SURGERY     x6   SHOULDER SURGERY Left    TOTAL KNEE ARTHROPLASTY Left 04/12/2021   Procedure: TOTAL KNEE ARTHROPLASTY;  Surgeon: Lyndle Herrlich, MD;  Location: [facility] ORS;  Service: Orthopedics;  Laterality: Left;   ULNAR NERVE TRANSPOSITION Right 11/17/2021   Procedure: SUBCUTANEOUS TRANSPOSITION OF ULNAR NERVE, RIGHT ELBOW;  Surgeon: Christena Flake, MD;  Location: [facility]  ORS;  Service: Orthopedics;  Laterality: Right;   ULNAR NERVE TRANSPOSITION Right 10/04/2022   Procedure: ULNAR NERVE TRANSPOSITION;  Surgeon: Lovenia Kim, MD;  Location: [facility] ORS;  Service: Neurosurgery;  Laterality: Right;  NO BLOCK   Patient Active Problem List   Diagnosis Date Noted   Ulnar neuropathy of right upper extremity 10/05/2022   Hand weakness 10/05/2022   Nerve pain 10/05/2022   Adenomatous polyp of colon 06/29/2022   S/P TKR (total knee replacement) using cement, left 04/12/2021   Cervical myelopathy (HCC) 01/13/2021   Seizure (HCC) 11/26/2020   Abnormal thyroid blood test 04/06/2016   Encounter for screening colonoscopy 01/05/2016   Abnormal weight gain 01/05/2016   Chronic prescription benzodiazepine use 10/19/2015   Elevated liver enzymes 08/20/2015   Medication monitoring  encounter 08/20/2015   Hypokalemia 08/20/2015   Erectile dysfunction 04/22/2015   Status post hardware removal 04/20/2015   Bipolar 2 disorder (HCC) 07/29/2014   Marijuana abuse 07/29/2014   Chronic knee pain 07/29/2014   Bipolar 1 disorder, mixed (HCC)    Drug abuse, opioid type (HCC) 06/28/2013   GERD without esophagitis 10/03/2012   Hypertension goal BP (blood pressure) < 140/90 10/03/2012   Chronic pain associated with significant psychosocial dysfunction 07/17/2012   Lumbar canal stenosis 04/09/2012   Hypercholesterolemia 01/06/2012   Constipation 11/11/2011   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/2010   Decreased motor strength 09/30/2010   Current tobacco use 09/30/2010   H/O transient cerebral ischemia 09/09/2010   Low back pain 08/30/2010   Cervical spinal cord compression (HCC) 07/22/2010   Cervical post-laminectomy syndrome 07/22/2010   Chronic obstructive pulmonary disease (HCC) 02/23/2010   ONSET DATE: 10/04/22 (most recent sx)  REFERRING DIAG: G56.21 (ICD-10-CM) - Ulnar neuropathy of right upper extremity   THERAPY DIAG:  Muscle weakness (generalized)  Other lack of coordination  Ulnar neuropathy at elbow of right upper extremity  Stiffness of right hand, not elsewhere classified  Rationale for Evaluation and Treatment: Rehabilitation  SUBJECTIVE:  SUBJECTIVE STATEMENT: Pt reports having to put his dog down this weekend.  Pt reports he used his hand a lot to dig a ditch to bury his dog in the front yard.  Pt states it took him 4 hours, and he did have to rest his hand the next day. Pt accompanied by: self, grandmother  PERTINENT HISTORY:  Per note from Dr. Katrinka Blazing on 11/16/22: [name] is 2 weeks status post Ulnar nerve decompression, AIN to ulnar nerve transfer, and ulnar nerve peripheral nerve stimulator placement. Given oxycodone on discharge from the hospital.   Per medical record, pt underwent first ulnar nerve transposition on 11/17/21 and participated  in OT post surgery.  2nd ulnar nerve transposition done on 10/04/22.  Pt had nerve stimulator placed but pt reports he does not like how it feels so he does not turn it on.  Pt also with hx of 6 cervical spinal surgeries; see above for additional hx.    PRECAUTIONS: None  RED FLAGS: None   WEIGHT BEARING RESTRICTIONS: No  PAIN: 01/02/23: R arm and hand 5-7/10 pain Are you having pain? Yes: NPRS scale: 2/10 at rest, with activity 6-7/10 Pain location: ulnar side of R arm extending from elbow to hand Pain description: pins and needles, occasional spasms, numbness Aggravating factors: "my arm doesn't like the cold, increased activity  Relieving factors: heat, rest, Gabapentin  LIVING ENVIRONMENT: Lives with: alone with 2 dogs  PLOF: Independent, works as a Scientist, water quality  PATIENT GOALS: build  up the strength in the R arm and hand  NEXT MD VISIT: Nov 1st to Dr. Katrinka Blazing  OBJECTIVE:  Note: Objective measures were completed at Evaluation unless otherwise noted.  HAND DOMINANCE: Right  ADLs: Overall ADLs: Pt reports he always tries to use the R arm as able, but in a limited capacity, and is unable to use the R hand with good accuracy or efficiency. Transfers/ambulation related to ADLs: indep Eating: gross grasp in R hand to hold utensils, difficulty cutting food  Grooming: uses L non-dominant hand to shave  UB Dressing: difficulty with clothing fasteners LB Dressing: difficulty with clothing fasteners, extra time to tie shoe laces  Toileting: uses L non-dominant hand for toilet hygiene Bathing: Pt states that he makes himself use the R hand when bathing but control is limited  FUNCTIONAL OUTCOME MEASURES: FOTO: 50; predicted with 56 12/26/22: FOTO: 49  UPPER EXTREMITY ROM:     Active ROM Left eval Right eval Right 12/26/22  Shoulder flexion     Shoulder abduction     Shoulder adduction     Shoulder extension     Shoulder internal rotation     Shoulder external rotation      Elbow flexion     Elbow extension     Wrist flexion     Wrist extension 65 58 60  Wrist ulnar deviation WNL -30 -30  Wrist radial deviation WNL WNL WNL  Wrist pronation 90 90 90  Wrist supination 75 70 75  (Blank rows = not tested)  Active ROM Right eval Left eval  Thumb MCP (0-60)    Thumb IP (0-80)    Thumb Radial abd/add (0-55)     Thumb Palmar abd/add (0-45)     Thumb Opposition to Small Finger     Index MCP (0-90)     Index PIP (0-100)     Index DIP (0-70)      Long MCP (0-90)      Long PIP (0-100)      Long DIP (0-70)      Ring MCP (0-90)      Ring PIP (0-100)      Ring DIP (0-70)      Little MCP (0-90)      Little PIP (0-100)      Little DIP (0-70)      (Blank rows = not tested)  Eval: R hand digits limited in PIP extension in 4th and 5th digits with neutral wrist (lacking ~25% ext in 4th digit PIP and 50% ext in 5th digit PIP) 12/26/22: R hand lacking ~10% ext in 4th digit PIP and lacking ~30% ext in 5th digit PIP  Eval: -limited active digit abd in 4th and 5th digits 12/26/22: able to achieve slight digit abduction in 4th and 5th digits when compensating with wrist and MP flexion    Eval: -Unable to oppose R 4th and 5th digits to thumb  12/26/22: unable to oppose R 4th and 5th digits to thumb  UPPER EXTREMITY MMT:     MMT Left eval Right eval Right 12/26/22  Shoulder flexion     Shoulder abduction     Shoulder adduction     Shoulder extension     Shoulder internal rotation     Shoulder external rotation     Middle trapezius     Lower trapezius     Elbow flexion 5 5   Elbow extension 5 5   Wrist flexion 5 4 4+  Wrist extension 5 4+ (simultaneous  radial dev) 4+ (slight radial dev)  Wrist ulnar deviation 5 1   Wrist radial deviation 5 5   Wrist pronation 5 5   Wrist supination 5 5   (Blank rows = not tested)  HAND FUNCTION: Grip strength: Right: 26 lbs; Left: 95 lbs, Lateral pinch: Right: 7 lbs, Left: 32 lbs, and 3 point pinch: Right: 4 lbs,  Left: 27 lbs 12/12/22: R grip 35 lbs; R lateral pinch: 8 lbs: 3 point pinch: 4 lbs  12/26/22: R grip 33 lbs; R lateral pinch: 8 lbs; 3 point pinch: 4 lbs (Pt reports increased fatigue in R hand today following extensive use of the hand over the weekend when using a tool)  COORDINATION: 9 Hole Peg test: Right: 2 min 39 sec; Left: 26 sec 12/26/22: R 48 secs  SENSATION: Light touch: Impaired ; ulnar nerve distribution  EDEMA: very mild edema surrounding incision sites on R forearm and elbow   COGNITION: Overall cognitive status: Within functional limits for tasks assessed Areas of impairment:  hx of bipolar 1, schizophrenia, and drug and alcohol abuse  OBSERVATIONS:  Pt pleasant, cooperative, and eager to regain strength in his R dominant arm.  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   Note for education/handouts: Pt reports he is limited with his reading and writing ability, but states he will always ask if he doesn't understand something.  TODAY'S TREATMENT:                                                                                                                              DATE: 01/02/23 Therapeutic Exercise: -Facilitated R hand strengthening/flexibility exercises: -Completed passive stretching for R wrist and digit ext, ulnar deviation -Facilitated hand strengthening with use of hand gripper set at 17.9# to place x2 trials and remove x2 trials jumbo pegs from pegboard using R hand.  -Completed place and hold exercises for R wrist and digit ext x2 sets 5 reps each -Completed active assisted digit abd/add x10 reps with OT providing stability for neutral wrist -Completed active assisted R ulnar deviation x10 reps. -Completed passive palmar add/abd of thumb to base of 5th digit, then active assisted for same x2 sets 5 reps each -Completed active assisted digit opposition thumb to 4th digit and thumb to 5th digit for 2 sets 5 reps  each.  Therapeutic Activity: Facilitated R hand FMC/dexterity skills working to pick up coins from a non-skid surface; not yet able to pick up from table top without sliding to the edge.  Pt practiced moving 1 coin from fingertips to palm, closing hand and supinating forearm, then pronating to drop the coin from hand to promote digit ext.  Attempted to move a quarter from palm with a penny also stored in hand, but pt lacked sufficient palmar abduction of the thumb to move coin within palm.  PATIENT EDUCATION: Education details: HEP progression (add thumb palm abd/add (passive, active,  active assisted) Person educated: Patient Education method: Explanation Education comprehension: verbalized understanding  HOME EXERCISE PROGRAM: Yellow theraputty, wrist/forearm strengthening with 2# dumbbell.  GOALS: Goals reviewed with patient? Yes  SHORT TERM GOALS: Target date: 01/02/23  Pt will be indep to perform HEP for improving distal RUE flexibility, strength, and coordination. Baseline: Eval: Not yet initiated; 12/26/22: pt routinely works on resistive hand strengthening exercises with tools and putty at home, as well as stretching wrist and digits, and FMC activities.  Goal status: achieved  LONG TERM GOALS: Target date: 02/13/23  Pt will increase FOTO score to 56 or better to indicate improvement in self perceived functional use of the R arm with daily tasks. Baseline: Eval: 50; 12/26/22: 49 Goal status: ongoing  2.  Pt will increase R grip strength by 10 or more lbs to ease ability carry heavy grocery bags in R dominant hand. Baseline: Eval: R grip 26 lbs (non-dominant L 95 lbs); 12/26/22: R grip 33 lbs Goal status: ongoing  3.  Pt will increase R lateral pinch strength by 5 or more lbs to ease ability to open drink bottles. Baseline: Eval: R 7 lbs (non-dominant L 32 lbs); 12/26/22: R 8 lbs Goal status: ongoing  4.  Pt will tolerate manual therapy, therapeutic modalities, and  exercises to decrease pain in RUE to a reported 4/10 pain or less with activity.   Baseline: Eval: R forearm and hand 6-7/10 pain with activity; 12/26/22: Pt reports increased pain today after increased use of R hand over the weekend; still at 6-7/10 pain with activity. Goal status: ongoing  5.  Pt will increase R hand FMC/dexterity skills to improve efficiency with clothing fasteners as noted by completion of 9 hole peg test in <40 sec (revised on 12/26/22 from <2 min).  Baseline: Eval: 2 min 39 sec; extra time clothing fasteners, specifically buttons and tying shoe laces; 12/26/22: R 48 sec  Goal status: achieved/revised/ongoing  ASSESSMENT: CLINICAL IMPRESSION: Pt reports having to put his dog down this weekend.  Pt reports he used his hand a lot to dig a ditch to bury his dog in the front yard.  Pt states it took him 4 hours, and he did have to rest his hand the next day.  Small scrape noted at base of nail bed on R hand 4th digit where pt reports he banged his finger on one of the tools he was using to dig the hole.  Therapist wore gloves to avoid contact to this wound.  Pt verbalized joint pain in this area (4th digit DIP joint), but adamantly declined to avoid grip strengthening this date.  OT monitored closely to ensure no increases in pain with any gripping activities.  HEP progression included passive, active, and active assisted R thumb palmar abd/add d/t decreased ability to perform these movements to move coins within palm of hand.  Pt will continue to benefit from skilled OT to address RUE weakness, coordination deficits, stiffness, and pain in the RUE, in order to work towards increased functional use of the R arm for ADL/IADL/work tasks.    PERFORMANCE DEFICITS: in functional skills including ADLs, IADLs, coordination, dexterity, sensation, edema, ROM, strength, pain, fascial restrictions, muscle spasms, flexibility, Fine motor control, body mechanics, decreased knowledge of use of DME,  skin integrity, and UE functional use, and psychosocial skills including coping strategies, environmental adaptation, habits, and routines and behaviors.   IMPAIRMENTS: are limiting patient from ADLs, IADLs, rest and sleep, work, and leisure.   COMORBIDITIES: has co-morbidities such  as hx of 6 cervical spinal surgeries, bipolar 1, schizophrenia  that affects occupational performance. Patient will benefit from skilled OT to address above impairments and improve overall function.  MODIFICATION OR ASSISTANCE TO COMPLETE EVALUATION: No modification of tasks or assist necessary to complete an evaluation.  OT OCCUPATIONAL PROFILE AND HISTORY: Problem focused assessment: Including review of records relating to presenting problem.  CLINICAL DECISION MAKING: Moderate - several treatment options, min-mod task modification necessary  REHAB POTENTIAL: Good  EVALUATION COMPLEXITY: Moderate      PLAN:  OT FREQUENCY: 2x/week  OT DURATION: 12 weeks  PLANNED INTERVENTIONS: self care/ADL training, therapeutic exercise, therapeutic activity, neuromuscular re-education, manual therapy, scar mobilization, passive range of motion, splinting, electrical stimulation, paraffin, moist heat, cryotherapy, contrast bath, patient/family education, cognitive remediation/compensation, psychosocial skills training, coping strategies training, and DME and/or AE instructions  RECOMMENDED OTHER SERVICES: None at this time  CONSULTED AND AGREED WITH PLAN OF CARE: Patient  PLAN FOR NEXT SESSION: see above  Danelle Earthly, MS, OTR/L  Otis Dials, OT 01/02/2023, 2:56 PM

## 2023-01-04 ENCOUNTER — Ambulatory Visit: Payer: MEDICAID

## 2023-01-04 DIAGNOSIS — G5621 Lesion of ulnar nerve, right upper limb: Secondary | ICD-10-CM | POA: Diagnosis not present

## 2023-01-04 DIAGNOSIS — M6281 Muscle weakness (generalized): Secondary | ICD-10-CM

## 2023-01-04 DIAGNOSIS — R278 Other lack of coordination: Secondary | ICD-10-CM

## 2023-01-04 DIAGNOSIS — M25631 Stiffness of right wrist, not elsewhere classified: Secondary | ICD-10-CM

## 2023-01-07 NOTE — Therapy (Signed)
OUTPATIENT OCCUPATIONAL THERAPY ORTHO TREATMENT NOTE  Patient Name: Scott Howell MRN: [id number] DOB:1970-05-31, 52 y.o., male Today's Date: 01/07/2023  PCP: Dr. Margarita Mail REFERRING PROVIDER: Dr. Ernestine Mcmurray   END OF SESSION:  OT End of Session - 01/07/23 0755     Visit Number 13    Number of Visits 24    Date for OT Re-Evaluation 02/13/23    Progress Note Due on Visit 10    OT Start Time 1400    OT Stop Time 1445    OT Time Calculation (min) 45 min    Activity Tolerance Patient tolerated treatment well    Behavior During Therapy WFL for tasks assessed/performed            Past Medical History:  Diagnosis Date   Adenomatous polyp of colon    Arthritis    Asthma    Benign fibroma of prostate    Bipolar 1 disorder (HCC)    Cervical myelopathy (HCC)    Cervical post-laminectomy syndrome    Cervical spinal cord compression (HCC)    Chronic knee pain    Chronic pain associated with significant psychosocial dysfunction    Chronic prescription benzodiazepine use    Constipation    COPD (chronic obstructive pulmonary disease) (HCC)    Decreased motor strength    Drug abuse, opioid type (HCC)    Dyspnea    Elevated liver enzymes    Erectile dysfunction    Fibromyalgia    GERD (gastroesophageal reflux disease)    H/O ETOH abuse    Headache    High cholesterol    History of 2019 novel coronavirus disease (COVID-19) 11/01/2021   History of transient cerebral ischemia    Homicidal ideation    Hypertension    Hypokalemia    Living accommodation issues    a.) as of 03/29/2021 --> living in camper   Lower back pain    Lumbar canal stenosis    Marijuana use    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   Pneumonia    Presence of permanent cardiac pacemaker    S/P TKR (total knee replacement)    Schizophrenia (HCC)    Seizures (HCC) 11/2020   Status post hardware removal    Stroke (HCC)    no deficits   Tobacco use    Past Surgical History:   Procedure Laterality Date   ANTERIOR CERVICAL DECOMP/DISCECTOMY FUSION N/A 01/13/2021   Procedure: C3-5 ANTERIOR CERVICAL DECOMPRESSION/DISCECTOMY FUSION;  Surgeon: Venetia Night, MD;  Location: [facility] ORS;  Service: Neurosurgery;  Laterality: N/A;   CERVICAL SPINE SURGERY     4   CLAVICLE SURGERY     COLONOSCOPY WITH PROPOFOL N/A 06/29/2022   Procedure: COLONOSCOPY WITH PROPOFOL;  Surgeon: Wyline Mood, MD;  Location: [facility];  Service: Gastroenterology;  Laterality: N/A;  USES MED TRANS; WILL NEED TIME ON FRIDAY, 06/24/2022   HARDWARE REMOVAL Left 04/20/2015   Procedure: HARDWARE REMOVAL left leg;  Surgeon: Deeann Saint, MD;  Location: [facility];  Service: Orthopedics;  Laterality: Left;   KNEE SURGERY Left    NECK SURGERY     x6   SHOULDER SURGERY Left    TOTAL KNEE ARTHROPLASTY Left 04/12/2021   Procedure: TOTAL KNEE ARTHROPLASTY;  Surgeon: Lyndle Herrlich, MD;  Location: [facility] OR[facility];  Service: Orthopedics;  Laterality: Left;   ULNAR NERVE TRANSPOSITION Right 11/17/2021   Procedure: SUBCUTANEOUS TRANSPOSITION OF ULNAR NERVE, RIGHT ELBOW;  Surgeon: Christena Flake, MD;  Location: [facility]  ORS;  Service: Orthopedics;  Laterality: Right;   ULNAR NERVE TRANSPOSITION Right 10/04/2022   Procedure: ULNAR NERVE TRANSPOSITION;  Surgeon: Lovenia Kim, MD;  Location: [facility]S;  Service: Neurosurgery;  Laterality: Right;  NO BLOCK   Patient Active Problem List   Diagnosis Date Noted   Ulnar neuropathy of right upper extremity 10/05/2022   Hand weakness 10/05/2022   Nerve pain 10/05/2022   Adenomatous polyp of colon 06/29/2022   S/P TKR (total knee replacement) using cement, left 04/12/2021   Cervical myelopathy (HCC) 01/13/2021   Seizure (HCC) 11/26/2020   Abnormal thyroid blood test 04/06/2016   Encounter for screening colonoscopy 01/05/2016   Abnormal weight gain 01/05/2016   Chronic prescription benzodiazepine use 10/19/2015   Elevated liver enzymes 08/20/2015   Medication monitoring  encounter 08/20/2015   Hypokalemia 08/20/2015   Erectile dysfunction 04/22/2015   Status post hardware removal 04/20/2015   Bipolar 2 disorder (HCC) 07/29/2014   Marijuana abuse 07/29/2014   Chronic knee pain 07/29/2014   Bipolar 1 disorder, mixed (HCC)    Drug abuse, opioid type (HCC) 06/28/2013   GERD without esophagitis 10/03/2012   Hypertension goal BP (blood pressure) < 140/90 10/03/2012   Chronic pain associated with significant psychosocial dysfunction 07/17/2012   Lumbar canal stenosis 04/09/2012   Hypercholesterolemia 01/06/2012   Constipation 11/11/2011   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/2010   Decreased motor strength 09/30/2010   Current tobacco use 09/30/2010   H/O transient cerebral ischemia 09/09/2010   Low back pain 08/30/2010   Cervical spinal cord compression (HCC) 07/22/2010   Cervical post-laminectomy syndrome 07/22/2010   Chronic obstructive pulmonary disease (HCC) 02/23/2010   ONSET DATE: 10/04/22 (most recent sx)  REFERRING DIAG: G56.21 (ICD-10-CM) - Ulnar neuropathy of right upper extremity   THERAPY DIAG:  Muscle weakness (generalized)  Other lack of coordination  Ulnar neuropathy at elbow of right upper extremity  Stiffness of right wrist, not elsewhere classified  Rationale for Evaluation and Treatment: Rehabilitation  SUBJECTIVE:  SUBJECTIVE STATEMENT: Pt reports doing well today. Pt accompanied by: self  PERTINENT HISTORY:  Per note from Dr. Katrinka Blazing on 11/16/22: [name] is 2 weeks status post Ulnar nerve decompression, AIN to ulnar nerve transfer, and ulnar nerve peripheral nerve stimulator placement. Given oxycodone on discharge from the hospital.   Per medical record, pt underwent first ulnar nerve transposition on 11/17/21 and participated in OT post surgery.  2nd ulnar nerve transposition done on 10/04/22.  Pt had nerve stimulator placed but pt reports he does not like how it feels so he does not turn it on.  Pt also with hx of 6  cervical spinal surgeries; see above for additional hx.    PRECAUTIONS: None  RED FLAGS: None   WEIGHT BEARING RESTRICTIONS: No  PAIN: 01/04/23: R arm and hand 5-7/10 pain Are you having pain? Yes: NPRS scale: 2/10 at rest, with activity 6-7/10 Pain location: ulnar side of R arm extending from elbow to hand Pain description: pins and needles, occasional spasms, numbness Aggravating factors: "my arm doesn't like the cold, increased activity  Relieving factors: heat, rest, Gabapentin  LIVING ENVIRONMENT: Lives with: alone with 2 dogs  PLOF: Independent, works as a Scientist, water quality  PATIENT GOALS: build up the strength in the R arm and hand  NEXT MD VISIT: Nov 1st to Dr. Katrinka Blazing  OBJECTIVE:  Note: Objective measures were completed at Evaluation unless otherwise noted.  HAND DOMINANCE: Right  ADLs: Overall ADLs: Pt reports he always tries to use  the R arm as able, but in a limited capacity, and is unable to use the R hand with good accuracy or efficiency. Transfers/ambulation related to ADLs: indep Eating: gross grasp in R hand to hold utensils, difficulty cutting food  Grooming: uses L non-dominant hand to shave  UB Dressing: difficulty with clothing fasteners LB Dressing: difficulty with clothing fasteners, extra time to tie shoe laces  Toileting: uses L non-dominant hand for toilet hygiene Bathing: Pt states that he makes himself use the R hand when bathing but control is limited  FUNCTIONAL OUTCOME MEASURES: FOTO: 50; predicted with 56 12/26/22: FOTO: 49  UPPER EXTREMITY ROM:     Active ROM Left eval Right eval Right 12/26/22  Shoulder flexion     Shoulder abduction     Shoulder adduction     Shoulder extension     Shoulder internal rotation     Shoulder external rotation     Elbow flexion     Elbow extension     Wrist flexion     Wrist extension 65 58 60  Wrist ulnar deviation WNL -30 -30  Wrist radial deviation WNL WNL WNL  Wrist pronation 90 90 90  Wrist  supination 75 70 75  (Blank rows = not tested)  Active ROM Right eval Left eval  Thumb MCP (0-60)    Thumb IP (0-80)    Thumb Radial abd/add (0-55)     Thumb Palmar abd/add (0-45)     Thumb Opposition to Small Finger     Index MCP (0-90)     Index PIP (0-100)     Index DIP (0-70)      Long MCP (0-90)      Long PIP (0-100)      Long DIP (0-70)      Ring MCP (0-90)      Ring PIP (0-100)      Ring DIP (0-70)      Little MCP (0-90)      Little PIP (0-100)      Little DIP (0-70)      (Blank rows = not tested)  Eval: R hand digits limited in PIP extension in 4th and 5th digits with neutral wrist (lacking ~25% ext in 4th digit PIP and 50% ext in 5th digit PIP) 12/26/22: R hand lacking ~10% ext in 4th digit PIP and lacking ~30% ext in 5th digit PIP  Eval: -limited active digit abd in 4th and 5th digits 12/26/22: able to achieve slight digit abduction in 4th and 5th digits when compensating with wrist and MP flexion    Eval: -Unable to oppose R 4th and 5th digits to thumb  12/26/22: unable to oppose R 4th and 5th digits to thumb  UPPER EXTREMITY MMT:     MMT Left eval Right eval Right 12/26/22  Shoulder flexion     Shoulder abduction     Shoulder adduction     Shoulder extension     Shoulder internal rotation     Shoulder external rotation     Middle trapezius     Lower trapezius     Elbow flexion 5 5   Elbow extension 5 5   Wrist flexion 5 4 4+  Wrist extension 5 4+ (simultaneous radial dev) 4+ (slight radial dev)  Wrist ulnar deviation 5 1   Wrist radial deviation 5 5   Wrist pronation 5 5   Wrist supination 5 5   (Blank rows = not tested)  HAND FUNCTION: Grip strength: Right: 26 lbs;  Left: 95 lbs, Lateral pinch: Right: 7 lbs, Left: 32 lbs, and 3 point pinch: Right: 4 lbs, Left: 27 lbs 12/12/22: R grip 35 lbs; R lateral pinch: 8 lbs: 3 point pinch: 4 lbs  12/26/22: R grip 33 lbs; R lateral pinch: 8 lbs; 3 point pinch: 4 lbs (Pt reports increased fatigue in R hand  today following extensive use of the hand over the weekend when using a tool)  COORDINATION: 9 Hole Peg test: Right: 2 min 39 sec; Left: 26 sec 12/26/22: R 48 secs  SENSATION: Light touch: Impaired ; ulnar nerve distribution  EDEMA: very mild edema surrounding incision sites on R forearm and elbow   COGNITION: Overall cognitive status: Within functional limits for tasks assessed Areas of impairment:  hx of bipolar 1, schizophrenia, and drug and alcohol abuse  OBSERVATIONS:  Pt pleasant, cooperative, and eager to regain strength in his R dominant arm.  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   Note for education/handouts: Pt reports he is limited with his reading and writing ability, but states he will always ask if he doesn't understand something.  TODAY'S TREATMENT:                                                                                                                              DATE: 11/20 11//24 Therapeutic Exercise: -Facilitated R hand strengthening/flexibility exercises: -Completed passive stretching for R wrist and digit ext, ulnar deviation -Facilitated hand strengthening with use of hand gripper set at 17.9# to place x2 trials and remove x2 trials jumbo pegs from pegboard using R hand.  -Completed place and hold exercises for R wrist and digit ext x2 sets 5 reps each -Completed passive palmar add/abd of thumb to base of 5th digit, then active assisted for same x2 sets 5 reps each -Completed passive forearm supination stretch   Neuro re-ed: Facilitated R hand FMC/dexterity skills working to rotate jumbo pegs from horizontal to vertical position within fingertips; tactile cues and visual demo to stabilize forearm on table top to isolate digits.   Therapeutic Activity: Facilitated R hand combination movements with weighted bar with mobile hand grips that target wrist flex, ext, and end piece allowing simulation of  turning a knob to facilitate active radial and ulnar deviation with OT stabilizing forearm in neutral pron/sup.    PATIENT EDUCATION: Education details: HEP progression Person educated: Patient Education method: Explanation Education comprehension: verbalized understanding  HOME EXERCISE PROGRAM: Yellow theraputty, wrist/forearm strengthening with 2# dumbbell.  GOALS: Goals reviewed with patient? Yes  SHORT TERM GOALS: Target date: 01/02/23  Pt will be indep to perform HEP for improving distal RUE flexibility, strength, and coordination. Baseline: Eval: Not yet initiated; 12/26/22: pt routinely works on resistive hand strengthening exercises with tools and putty at home, as well as stretching wrist and digits, and FMC activities.  Goal status: achieved  LONG TERM GOALS: Target date: 02/13/23  Pt  will increase FOTO score to 56 or better to indicate improvement in self perceived functional use of the R arm with daily tasks. Baseline: Eval: 50; 12/26/22: 49 Goal status: ongoing  2.  Pt will increase R grip strength by 10 or more lbs to ease ability carry heavy grocery bags in R dominant hand. Baseline: Eval: R grip 26 lbs (non-dominant L 95 lbs); 12/26/22: R grip 33 lbs Goal status: ongoing  3.  Pt will increase R lateral pinch strength by 5 or more lbs to ease ability to open drink bottles. Baseline: Eval: R 7 lbs (non-dominant L 32 lbs); 12/26/22: R 8 lbs Goal status: ongoing  4.  Pt will tolerate manual therapy, therapeutic modalities, and exercises to decrease pain in RUE to a reported 4/10 pain or less with activity.   Baseline: Eval: R forearm and hand 6-7/10 pain with activity; 12/26/22: Pt reports increased pain today after increased use of R hand over the weekend; still at 6-7/10 pain with activity. Goal status: ongoing  5.  Pt will increase R hand FMC/dexterity skills to improve efficiency with clothing fasteners as noted by completion of 9 hole peg test in <40 sec  (revised on 12/26/22 from <2 min).  Baseline: Eval: 2 min 39 sec; extra time clothing fasteners, specifically buttons and tying shoe laces; 12/26/22: R 48 sec  Goal status: achieved/revised/ongoing  ASSESSMENT: CLINICAL IMPRESSION: Pt verbalized improved joint pain in 4th digit DIP joint this date.  Pt with good tolerance to therapeutic exercises and activities, though requiring frequent brief rest periods for use of moist heat and stretching in order to reduce cramping in hand and forearm.  Cramping continues to be more present with high demand fine motor tasks, with pt being encouraged to allow more frequent rest periods in order to perform activities without cramping.  Pt continues to push himself to the point of cramping.  Pt will continue to benefit from skilled OT to address RUE weakness, coordination deficits, stiffness, and pain in the RUE, in order to work towards increased functional use of the R arm for ADL/IADL/work tasks.    PERFORMANCE DEFICITS: in functional skills including ADLs, IADLs, coordination, dexterity, sensation, edema, ROM, strength, pain, fascial restrictions, muscle spasms, flexibility, Fine motor control, body mechanics, decreased knowledge of use of DME, skin integrity, and UE functional use, and psychosocial skills including coping strategies, environmental adaptation, habits, and routines and behaviors.   IMPAIRMENTS: are limiting patient from ADLs, IADLs, rest and sleep, work, and leisure.   COMORBIDITIES: has co-morbidities such as hx of 6 cervical spinal surgeries, bipolar 1, schizophrenia  that affects occupational performance. Patient will benefit from skilled OT to address above impairments and improve overall function.  MODIFICATION OR ASSISTANCE TO COMPLETE EVALUATION: No modification of tasks or assist necessary to complete an evaluation.  OT OCCUPATIONAL PROFILE AND HISTORY: Problem focused assessment: Including review of records relating to presenting  problem.  CLINICAL DECISION MAKING: Moderate - several treatment options, min-mod task modification necessary  REHAB POTENTIAL: Good  EVALUATION COMPLEXITY: Moderate      PLAN:  OT FREQUENCY: 2x/week  OT DURATION: 12 weeks  PLANNED INTERVENTIONS: self care/ADL training, therapeutic exercise, therapeutic activity, neuromuscular re-education, manual therapy, scar mobilization, passive range of motion, splinting, electrical stimulation, paraffin, moist heat, cryotherapy, contrast bath, patient/family education, cognitive remediation/compensation, psychosocial skills training, coping strategies training, and DME and/or AE instructions  RECOMMENDED OTHER SERVICES: None at this time  CONSULTED AND AGREED WITH PLAN OF CARE: Patient  PLAN FOR NEXT SESSION:  see above  Danelle Earthly, MS, OTR/L  Otis Dials, OT 01/07/2023, 7:59 AM

## 2023-01-09 ENCOUNTER — Ambulatory Visit: Payer: MEDICAID

## 2023-01-11 ENCOUNTER — Ambulatory Visit: Payer: MEDICAID

## 2023-01-11 DIAGNOSIS — G5621 Lesion of ulnar nerve, right upper limb: Secondary | ICD-10-CM | POA: Diagnosis not present

## 2023-01-11 DIAGNOSIS — M25631 Stiffness of right wrist, not elsewhere classified: Secondary | ICD-10-CM

## 2023-01-11 DIAGNOSIS — M6281 Muscle weakness (generalized): Secondary | ICD-10-CM

## 2023-01-11 DIAGNOSIS — R278 Other lack of coordination: Secondary | ICD-10-CM

## 2023-01-11 DIAGNOSIS — M25641 Stiffness of right hand, not elsewhere classified: Secondary | ICD-10-CM
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
## 2023-01-16 ENCOUNTER — Ambulatory Visit: Payer: MEDICAID | Attending: Neurosurgery

## 2023-01-16 DIAGNOSIS — M6281 Muscle weakness (generalized): Secondary | ICD-10-CM | POA: Diagnosis present

## 2023-01-16 DIAGNOSIS — R262 Difficulty in walking, not elsewhere classified: Secondary | ICD-10-CM | POA: Diagnosis present

## 2023-01-16 DIAGNOSIS — G5621 Lesion of ulnar nerve, right upper limb: Secondary | ICD-10-CM | POA: Insufficient documentation

## 2023-01-16 DIAGNOSIS — M25631 Stiffness of right wrist, not elsewhere classified: Secondary | ICD-10-CM | POA: Insufficient documentation

## 2023-01-16 DIAGNOSIS — R278 Other lack of coordination: Secondary | ICD-10-CM | POA: Insufficient documentation

## 2023-01-16 DIAGNOSIS — M25641 Stiffness of right hand, not elsewhere classified: Secondary | ICD-10-CM | POA: Diagnosis present

## 2023-01-16 DIAGNOSIS — M542 Cervicalgia: Secondary | ICD-10-CM | POA: Diagnosis present

## 2023-01-17 NOTE — Therapy (Signed)
OUTPATIENT OCCUPATIONAL THERAPY ORTHO TREATMENT NOTE  Patient Name: Scott Howell MRN: [id number] DOB:22-Jun-1970, 52 y.o., male Today's Date: 01/17/2023  PCP: Dr. Margarita Mail REFERRING PROVIDER: Dr. Ernestine Mcmurray   END OF SESSION:  OT End of Session - 01/17/23 0748     Visit Number 16    Number of Visits 24    Date for OT Re-Evaluation 02/13/23    Progress Note Due on Visit 10    OT Start Time 1400    OT Stop Time 1445    OT Time Calculation (min) 45 min    Activity Tolerance Patient tolerated treatment well    Behavior During Therapy WFL for tasks assessed/performed            Past Medical History:  Diagnosis Date   Adenomatous polyp of colon    Arthritis    Asthma    Benign fibroma of prostate    Bipolar 1 disorder (HCC)    Cervical myelopathy (HCC)    Cervical post-laminectomy syndrome    Cervical spinal cord compression (HCC)    Chronic knee pain    Chronic pain associated with significant psychosocial dysfunction    Chronic prescription benzodiazepine use    Constipation    COPD (chronic obstructive pulmonary disease) (HCC)    Decreased motor strength    Drug abuse, opioid type (HCC)    Dyspnea    Elevated liver enzymes    Erectile dysfunction    Fibromyalgia    GERD (gastroesophageal reflux disease)    H/O ETOH abuse    Headache    High cholesterol    History of 2019 novel coronavirus disease (COVID-19) 11/01/2021   History of transient cerebral ischemia    Homicidal ideation    Hypertension    Hypokalemia    Living accommodation issues    a.) as of 03/29/2021 --> living in camper   Lower back pain    Lumbar canal stenosis    Marijuana use    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   Pneumonia    Presence of permanent cardiac pacemaker    S/P TKR (total knee replacement)    Schizophrenia (HCC)    Seizures (HCC) 11/2020   Status post hardware removal    Stroke (HCC)    no deficits   Tobacco use    Past Surgical History:   Procedure Laterality Date   ANTERIOR CERVICAL DECOMP/DISCECTOMY FUSION N/A 01/13/2021   Procedure: C3-5 ANTERIOR CERVICAL DECOMPRESSION/DISCECTOMY FUSION;  Surgeon: Venetia Night, MD;  Location: [facility] ORS;  Service: Neurosurgery;  Laterality: N/A;   CERVICAL SPINE SURGERY     4   CLAVICLE SURGERY     COLONOSCOPY WITH PROPOFOL N/A 06/29/2022   Procedure: COLONOSCOPY WITH PROPOFOL;  Surgeon: Wyline Mood, MD;  Location: [facility] ENDOSCOPY;  Service: Gastroenterology;  Laterality: N/A;  USES MED TRANS; WILL NEED TIME ON FRIDAY, 06/24/2022   HARDWARE REMOVAL Left 04/20/2015   Procedure: HARDWARE REMOVAL left leg;  Surgeon: Deeann Saint, MD;  Location: [facility] ORS;  Service: Orthopedics;  Laterality: Left;   KNEE SURGERY Left    NECK SURGERY     x6   SHOULDER SURGERY Left    TOTAL KNEE ARTHROPLASTY Left 04/12/2021   Procedure: TOTAL KNEE ARTHROPLASTY;  Surgeon: Lyndle Herrlich, MD;  Location: [facility] ORS;  Service: Orthopedics;  Laterality: Left;   ULNAR NERVE TRANSPOSITION Right 11/17/2021   Procedure: SUBCUTANEOUS TRANSPOSITION OF ULNAR NERVE, RIGHT ELBOW;  Surgeon: Christena Flake, MD;  Location: [facility]  ORS;  Service: Orthopedics;  Laterality: Right;   ULNAR NERVE TRANSPOSITION Right 10/04/2022   Procedure: ULNAR NERVE TRANSPOSITION;  Surgeon: Lovenia Kim, MD;  Location: [facility] ORS;  Service: Neurosurgery;  Laterality: Right;  NO BLOCK   Patient Active Problem List   Diagnosis Date Noted   Ulnar neuropathy of right upper extremity 10/05/2022   Hand weakness 10/05/2022   Nerve pain 10/05/2022   Adenomatous polyp of colon 06/29/2022   S/P TKR (total knee replacement) using cement, left 04/12/2021   Cervical myelopathy (HCC) 01/13/2021   Seizure (HCC) 11/26/2020   Abnormal thyroid blood test 04/06/2016   Encounter for screening colonoscopy 01/05/2016   Abnormal weight gain 01/05/2016   Chronic prescription benzodiazepine use 10/19/2015   Elevated liver enzymes 08/20/2015   Medication monitoring  encounter 08/20/2015   Hypokalemia 08/20/2015   Erectile dysfunction 04/22/2015   Status post hardware removal 04/20/2015   Bipolar 2 disorder (HCC) 07/29/2014   Marijuana abuse 07/29/2014   Chronic knee pain 07/29/2014   Bipolar 1 disorder, mixed (HCC)    Drug abuse, opioid type (HCC) 06/28/2013   GERD without esophagitis 10/03/2012   Hypertension goal BP (blood pressure) < 140/90 10/03/2012   Chronic pain associated with significant psychosocial dysfunction 07/17/2012   Lumbar canal stenosis 04/09/2012   Hypercholesterolemia 01/06/2012   Constipation 11/11/2011   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/2010   Decreased motor strength 09/30/2010   Current tobacco use 09/30/2010   H/O transient cerebral ischemia 09/09/2010   Low back pain 08/30/2010   Cervical spinal cord compression (HCC) 07/22/2010   Cervical post-laminectomy syndrome 07/22/2010   Chronic obstructive pulmonary disease (HCC) 02/23/2010   ONSET DATE: 10/04/22 (most recent sx)  REFERRING DIAG: G56.21 (ICD-10-CM) - Ulnar neuropathy of right upper extremity   THERAPY DIAG:  Muscle weakness (generalized)  Other lack of coordination  Ulnar neuropathy at elbow of right upper extremity  Stiffness of right wrist, not elsewhere classified  Rationale for Evaluation and Treatment: Rehabilitation  SUBJECTIVE:  SUBJECTIVE STATEMENT: Pt reports that he has started to use his R hand to hold his tv remote, though it's still a challenge to push the buttons with this hand.  Pt accompanied by: self  PERTINENT HISTORY:  Per note from Dr. Katrinka Blazing on 11/16/22: Scott Howell is 2 weeks status post Ulnar nerve decompression, AIN to ulnar nerve transfer, and ulnar nerve peripheral nerve stimulator placement. Given oxycodone on discharge from the hospital.   Per medical record, pt underwent first ulnar nerve transposition on 11/17/21 and participated in OT post surgery.  2nd ulnar nerve transposition done on 10/04/22.  Pt had nerve  stimulator placed but pt reports he does not like how it feels so he does not turn it on.  Pt also with hx of 6 cervical spinal surgeries; see above for additional hx.    PRECAUTIONS: None  RED FLAGS: None   WEIGHT BEARING RESTRICTIONS: No  PAIN: 01/16/23: R wrist/hand 3/10 pain, R arm and hand 5-7/10 pain with activity Are you having pain? Yes: NPRS scale: 2/10 at rest, with activity 6-7/10 Pain location: ulnar side of R arm extending from elbow to hand Pain description: pins and needles, occasional spasms, numbness Aggravating factors: "my arm doesn't like the cold, increased activity  Relieving factors: heat, rest, Gabapentin  LIVING ENVIRONMENT: Lives with: alone with 2 dogs  PLOF: Independent, works as a Scientist, water quality  PATIENT GOALS: build up the strength in the R arm and hand  NEXT MD VISIT: Nov 1st to  Dr. Katrinka Blazing  OBJECTIVE:  Note: Objective measures were completed at Evaluation unless otherwise noted.  HAND DOMINANCE: Right  ADLs: Overall ADLs: Pt reports he always tries to use the R arm as able, but in a limited capacity, and is unable to use the R hand with good accuracy or efficiency. Transfers/ambulation related to ADLs: indep Eating: gross grasp in R hand to hold utensils, difficulty cutting food  Grooming: uses L non-dominant hand to shave  UB Dressing: difficulty with clothing fasteners LB Dressing: difficulty with clothing fasteners, extra time to tie shoe laces  Toileting: uses L non-dominant hand for toilet hygiene Bathing: Pt states that he makes himself use the R hand when bathing but control is limited  FUNCTIONAL OUTCOME MEASURES: FOTO: 50; predicted with 56 12/26/22: FOTO: 49  UPPER EXTREMITY ROM:     Active ROM Left eval Right eval Right 12/26/22  Shoulder flexion     Shoulder abduction     Shoulder adduction     Shoulder extension     Shoulder internal rotation     Shoulder external rotation     Elbow flexion     Elbow extension     Wrist  flexion     Wrist extension 65 58 60  Wrist ulnar deviation WNL -30 -30  Wrist radial deviation WNL WNL WNL  Wrist pronation 90 90 90  Wrist supination 75 70 75  (Blank rows = not tested)  Active ROM Right eval Left eval  Thumb MCP (0-60)    Thumb IP (0-80)    Thumb Radial abd/add (0-55)     Thumb Palmar abd/add (0-45)     Thumb Opposition to Small Finger     Index MCP (0-90)     Index PIP (0-100)     Index DIP (0-70)      Long MCP (0-90)      Long PIP (0-100)      Long DIP (0-70)      Ring MCP (0-90)      Ring PIP (0-100)      Ring DIP (0-70)      Little MCP (0-90)      Little PIP (0-100)      Little DIP (0-70)      (Blank rows = not tested)  Eval: R hand digits limited in PIP extension in 4th and 5th digits with neutral wrist (lacking ~25% ext in 4th digit PIP and 50% ext in 5th digit PIP) 12/26/22: R hand lacking ~10% ext in 4th digit PIP and lacking ~30% ext in 5th digit PIP  Eval: -limited active digit abd in 4th and 5th digits 12/26/22: able to achieve slight digit abduction in 4th and 5th digits when compensating with wrist and MP flexion    Eval: -Unable to oppose R 4th and 5th digits to thumb  12/26/22: unable to oppose R 4th and 5th digits to thumb  UPPER EXTREMITY MMT:     MMT Left eval Right eval Right 12/26/22  Shoulder flexion     Shoulder abduction     Shoulder adduction     Shoulder extension     Shoulder internal rotation     Shoulder external rotation     Middle trapezius     Lower trapezius     Elbow flexion 5 5   Elbow extension 5 5   Wrist flexion 5 4 4+  Wrist extension 5 4+ (simultaneous radial dev) 4+ (slight radial dev)  Wrist ulnar deviation 5 1   Wrist radial  deviation 5 5   Wrist pronation 5 5   Wrist supination 5 5   (Blank rows = not tested)  HAND FUNCTION: Grip strength: Right: 26 lbs; Left: 95 lbs, Lateral pinch: Right: 7 lbs, Left: 32 lbs, and 3 point pinch: Right: 4 lbs, Left: 27 lbs 12/12/22: R grip 35 lbs; R lateral  pinch: 8 lbs: 3 point pinch: 4 lbs  12/26/22: R grip 33 lbs; R lateral pinch: 8 lbs; 3 point pinch: 4 lbs (Pt reports increased fatigue in R hand today following extensive use of the hand over the weekend when using a tool)  COORDINATION: 9 Hole Peg test: Right: 2 min 39 sec; Left: 26 sec 12/26/22: R 48 secs  SENSATION: Light touch: Impaired ; ulnar nerve distribution  EDEMA: very mild edema surrounding incision sites on R forearm and elbow   COGNITION: Overall cognitive status: Within functional limits for tasks assessed Areas of impairment:  hx of bipolar 1, schizophrenia, and drug and alcohol abuse  OBSERVATIONS:  Pt pleasant, cooperative, and eager to regain strength in his R dominant arm.  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   Note for education/handouts: Pt reports he is limited with his reading and writing ability, but states he will always ask if he doesn't understand something.  TODAY'S TREATMENT:                                                                                                                              DATE: 01/16/23 Therapeutic Exercise: -Facilitated R hand strengthening/flexibility exercises: -Completed passive stretching for R wrist and digit ext, ulnar deviation, R forearm sup --Completed passive palmar add/abd of thumb to base of 5th digit, then active assisted for same -Facilitated hand strengthening with use of hand gripper set at 17.9# to place/remove jumbo pegs from pegboard x4 trials using R hand; min vc to reduce R flexion during gripping reps -Completed active assisted R radial deviation x3 sets 10 reps each  Therapeutic Activity: Facilitated R FMC/dexterity skills working to maintain thumb opposition to each finger holding poker chip between each digit and thumb.  Assist needed to set up chip between thumb and 3rd digit, 4th digit, and 5th digit (place and hold).  Practiced holding chip against  mild perturbation.  Practiced translatory skills working to move 1 poker chip placed at the base of the IF, and moving chip out of radial side of palm using thumb with R forearm supinated; min A.     PATIENT EDUCATION: Education details: HEP progression; manipulation skills with a poker chip sized item Person educated: Patient Education method: Explanation Education comprehension: verbalized understanding  HOME EXERCISE PROGRAM: Yellow theraputty, wrist/forearm strengthening with 2# dumbbell.  GOALS: Goals reviewed with patient? Yes  SHORT TERM GOALS: Target date: 01/02/23  Pt will be indep to perform HEP for improving distal RUE flexibility, strength, and coordination. Baseline: Eval: Not yet initiated; 12/26/22: pt routinely  works on Management consultant exercises with tools and putty at home, as well as stretching wrist and digits, and FMC activities.  Goal status: achieved  LONG TERM GOALS: Target date: 02/13/23  Pt will increase FOTO score to 56 or better to indicate improvement in self perceived functional use of the R arm with daily tasks. Baseline: Eval: 50; 12/26/22: 49 Goal status: ongoing  2.  Pt will increase R grip strength by 10 or more lbs to ease ability carry heavy grocery bags in R dominant hand. Baseline: Eval: R grip 26 lbs (non-dominant L 95 lbs); 12/26/22: R grip 33 lbs Goal status: ongoing  3.  Pt will increase R lateral pinch strength by 5 or more lbs to ease ability to open drink bottles. Baseline: Eval: R 7 lbs (non-dominant L 32 lbs); 12/26/22: R 8 lbs Goal status: ongoing  4.  Pt will tolerate manual therapy, therapeutic modalities, and exercises to decrease pain in RUE to a reported 4/10 pain or less with activity.   Baseline: Eval: R forearm and hand 6-7/10 pain with activity; 12/26/22: Pt reports increased pain today after increased use of R hand over the weekend; still at 6-7/10 pain with activity. Goal status: ongoing  5.  Pt will  increase R hand FMC/dexterity skills to improve efficiency with clothing fasteners as noted by completion of 9 hole peg test in <40 sec (revised on 12/26/22 from <2 min).  Baseline: Eval: 2 min 39 sec; extra time clothing fasteners, specifically buttons and tying shoe laces; 12/26/22: R 48 sec  Goal status: achieved/revised/ongoing  ASSESSMENT: CLINICAL IMPRESSION: Pt reports that he has started to use his R hand to hold his tv remote, though it's still a challenge to push the buttons with this hand.  Pt is demonstrating increased strength in the R wrist and demonstrated by maintaining a more neutral wrist during gripping maneuvers, noting only mild flexion at times when gripping.  Pt also able to demo active radial deviation in gravity assisted position.  Pt continues to demo weakness in the hand which significantly limits FMC/dexterity skills.  Though improving, pt continues to demonstrate weakness with digit opposition and thumb flexion, which limits ability to move any number of small objections within palm of hand without dropping them.  Pt will continue to benefit from skilled OT to address RUE weakness, coordination deficits, stiffness, and pain in the RUE, in order to work towards increased functional use of the R arm for ADL/IADL/work tasks.    PERFORMANCE DEFICITS: in functional skills including ADLs, IADLs, coordination, dexterity, sensation, edema, ROM, strength, pain, fascial restrictions, muscle spasms, flexibility, Fine motor control, body mechanics, decreased knowledge of use of DME, skin integrity, and UE functional use, and psychosocial skills including coping strategies, environmental adaptation, habits, and routines and behaviors.   IMPAIRMENTS: are limiting patient from ADLs, IADLs, rest and sleep, work, and leisure.   COMORBIDITIES: has co-morbidities such as hx of 6 cervical spinal surgeries, bipolar 1, schizophrenia  that affects occupational performance. Patient will benefit from  skilled OT to address above impairments and improve overall function.  MODIFICATION OR ASSISTANCE TO COMPLETE EVALUATION: No modification of tasks or assist necessary to complete an evaluation.  OT OCCUPATIONAL PROFILE AND HISTORY: Problem focused assessment: Including review of records relating to presenting problem.  CLINICAL DECISION MAKING: Moderate - several treatment options, min-mod task modification necessary  REHAB POTENTIAL: Good  EVALUATION COMPLEXITY: Moderate      PLAN:  OT FREQUENCY: 2x/week  OT DURATION: 12 weeks  PLANNED INTERVENTIONS: self care/ADL training, therapeutic exercise, therapeutic activity, neuromuscular re-education, manual therapy, scar mobilization, passive range of motion, splinting, electrical stimulation, paraffin, moist heat, cryotherapy, contrast bath, patient/family education, cognitive remediation/compensation, psychosocial skills training, coping strategies training, and DME and/or AE instructions  RECOMMENDED OTHER SERVICES: None at this time  CONSULTED AND AGREED WITH PLAN OF CARE: Patient  PLAN FOR NEXT SESSION: see above  Danelle Earthly, MS, OTR/L  Otis Dials, OT 01/17/2023, 7:49 AM

## 2023-01-18 ENCOUNTER — Ambulatory Visit: Payer: MEDICAID

## 2023-01-18 DIAGNOSIS — M6281 Muscle weakness (generalized): Secondary | ICD-10-CM | POA: Diagnosis not present

## 2023-01-18 DIAGNOSIS — M25641 Stiffness of right hand, not elsewhere classified: Secondary | ICD-10-CM

## 2023-01-18 DIAGNOSIS — M25631 Stiffness of right wrist, not elsewhere classified: Secondary | ICD-10-CM

## 2023-01-18 DIAGNOSIS — G5621 Lesion of ulnar nerve, right upper limb: Secondary | ICD-10-CM

## 2023-01-18 DIAGNOSIS — R278 Other lack of coordination: Secondary | ICD-10-CM

## 2023-01-19 ENCOUNTER — Ambulatory Visit: Payer: MEDICAID

## 2023-01-19 VITALS — BP 127/68 | HR 79

## 2023-01-19 DIAGNOSIS — R262 Difficulty in walking, not elsewhere classified: Secondary | ICD-10-CM

## 2023-01-19 DIAGNOSIS — M6281 Muscle weakness (generalized): Secondary | ICD-10-CM | POA: Diagnosis not present

## 2023-01-19 DIAGNOSIS — M542 Cervicalgia: Secondary | ICD-10-CM
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
## 2023-01-21 NOTE — Therapy (Signed)
OUTPATIENT OCCUPATIONAL THERAPY ORTHO TREATMENT NOTE  Patient Name: Scott Howell MRN: [id number] DOB:01-08-1971, 52 y.o., male Today's Date: 01/21/2023  PCP: Dr. Margarita Mail REFERRING PROVIDER: Dr. Ernestine Mcmurray   END OF SESSION:  OT End of Session - 01/21/23 2030     Visit Number 17    Number of Visits 24    Date for OT Re-Evaluation 02/13/23    Progress Note Due on Visit 10    OT Start Time 1400    OT Stop Time 1445    OT Time Calculation (min) 45 min    Activity Tolerance Patient tolerated treatment well    Behavior During Therapy WFL for tasks assessed/performed            Past Medical History:  Diagnosis Date   Adenomatous polyp of colon    Arthritis    Asthma    Benign fibroma of prostate    Bipolar 1 disorder (HCC)    Cervical myelopathy (HCC)    Cervical post-laminectomy syndrome    Cervical spinal cord compression (HCC)    Chronic knee pain    Chronic pain associated with significant psychosocial dysfunction    Chronic prescription benzodiazepine use    Constipation    COPD (chronic obstructive pulmonary disease) (HCC)    Decreased motor strength    Drug abuse, opioid type (HCC)    Dyspnea    Elevated liver enzymes    Erectile dysfunction    Fibromyalgia    GERD (gastroesophageal reflux disease)    H/O ETOH abuse    Headache    High cholesterol    History of 2019 novel coronavirus disease (COVID-19) 11/01/2021   History of transient cerebral ischemia    Homicidal ideation    Hypertension    Hypokalemia    Living accommodation issues    a.) as of 03/29/2021 --> living in camper   Lower back pain    Lumbar canal stenosis    Marijuana use    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   Pneumonia    Presence of permanent cardiac pacemaker    S/P TKR (total knee replacement)    Schizophrenia (HCC)    Seizures (HCC) 11/2020   Status post hardware removal    Stroke (HCC)    no deficits   Tobacco use    Past Surgical History:   Procedure Laterality Date   ANTERIOR CERVICAL DECOMP/DISCECTOMY FUSION N/A 01/13/2021   Procedure: C3-5 ANTERIOR CERVICAL DECOMPRESSION/DISCECTOMY FUSION;  Surgeon: Venetia Night, MD;  Location: [facility] ORS;  Service: Neurosurgery;  Laterality: N/A;   CERVICAL SPINE SURGERY     4   CLAVICLE SURGERY     COLONOSCOPY WITH PROPOFOL N/A 06/29/2022   Procedure: COLONOSCOPY WITH PROPOFOL;  Surgeon: Wyline Mood, MD;  Location: [facility] ENDOSCOPY;  Service: Gastroenterology;  Laterality: N/A;  USES MED TRANS; WILL NEED TIME ON FRIDAY, 06/24/2022   HARDWARE REMOVAL Left 04/20/2015   Procedure: HARDWARE REMOVAL left leg;  Surgeon: Deeann Saint, MD;  Location: [facility] ORS;  Service: Orthopedics;  Laterality: Left;   KNEE SURGERY Left    NECK SURGERY     x6   SHOULDER SURGERY Left    TOTAL KNEE ARTHROPLASTY Left 04/12/2021   Procedure: TOTAL KNEE ARTHROPLASTY;  Surgeon: Lyndle Herrlich, MD;  Location: [facility] ORS;  Service: Orthopedics;  Laterality: Left;   ULNAR NERVE TRANSPOSITION Right 11/17/2021   Procedure: SUBCUTANEOUS TRANSPOSITION OF ULNAR NERVE, RIGHT ELBOW;  Surgeon: Christena Flake, MD;  Location: [facility]  ORS;  Service: Orthopedics;  Laterality: Right;   ULNAR NERVE TRANSPOSITION Right 10/04/2022   Procedure: ULNAR NERVE TRANSPOSITION;  Surgeon: Lovenia Kim, MD;  Location: [facility] ORS;  Service: Neurosurgery;  Laterality: Right;  NO BLOCK   Patient Active Problem List   Diagnosis Date Noted   Ulnar neuropathy of right upper extremity 10/05/2022   Hand weakness 10/05/2022   Nerve pain 10/05/2022   Adenomatous polyp of colon 06/29/2022   S/P TKR (total knee replacement) using cement, left 04/12/2021   Cervical myelopathy (HCC) 01/13/2021   Seizure (HCC) 11/26/2020   Abnormal thyroid blood test 04/06/2016   Encounter for screening colonoscopy 01/05/2016   Abnormal weight gain 01/05/2016   Chronic prescription benzodiazepine use 10/19/2015   Elevated liver enzymes 08/20/2015   Medication monitoring  encounter 08/20/2015   Hypokalemia 08/20/2015   Erectile dysfunction 04/22/2015   Status post hardware removal 04/20/2015   Bipolar 2 disorder (HCC) 07/29/2014   Marijuana abuse 07/29/2014   Chronic knee pain 07/29/2014   Bipolar 1 disorder, mixed (HCC)    Drug abuse, opioid type (HCC) 06/28/2013   GERD without esophagitis 10/03/2012   Hypertension goal BP (blood pressure) < 140/90 10/03/2012   Chronic pain associated with significant psychosocial dysfunction 07/17/2012   Lumbar canal stenosis 04/09/2012   Hypercholesterolemia 01/06/2012   Constipation 11/11/2011   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/2010   Decreased motor strength 09/30/2010   Current tobacco use 09/30/2010   H/O transient cerebral ischemia 09/09/2010   Low back pain 08/30/2010   Cervical spinal cord compression (HCC) 07/22/2010   Cervical post-laminectomy syndrome 07/22/2010   Chronic obstructive pulmonary disease (HCC) 02/23/2010   ONSET DATE: 10/04/22 (most recent sx)  REFERRING DIAG: G56.21 (ICD-10-CM) - Ulnar neuropathy of right upper extremity   THERAPY DIAG:  Muscle weakness (generalized)  Other lack of coordination  Ulnar neuropathy at elbow of right upper extremity  Stiffness of right wrist, not elsewhere classified  Stiffness of right hand, not elsewhere classified  Rationale for Evaluation and Treatment: Rehabilitation  SUBJECTIVE:  SUBJECTIVE STATEMENT: Pt reports that he has started to use his R hand to hold his tv remote, though it's still a challenge to push the buttons with this hand.  Pt accompanied by: self  PERTINENT HISTORY:  Per note from Dr. Katrinka Blazing on 11/16/22: Scott Howell is 2 weeks status post Ulnar nerve decompression, AIN to ulnar nerve transfer, and ulnar nerve peripheral nerve stimulator placement. Given oxycodone on discharge from the hospital.   Per medical record, pt underwent first ulnar nerve transposition on 11/17/21 and participated in OT post surgery.  2nd ulnar  nerve transposition done on 10/04/22.  Pt had nerve stimulator placed but pt reports he does not like how it feels so he does not turn it on.  Pt also with hx of 6 cervical spinal surgeries; see above for additional hx.    PRECAUTIONS: None  RED FLAGS: None   WEIGHT BEARING RESTRICTIONS: No  PAIN: 01/16/23: R wrist/hand 3/10 pain, R arm and hand 5-7/10 pain with activity Are you having pain? Yes: NPRS scale: 2/10 at rest, with activity 6-7/10 Pain location: ulnar side of R arm extending from elbow to hand Pain description: pins and needles, occasional spasms, numbness Aggravating factors: "my arm doesn't like the cold, increased activity  Relieving factors: heat, rest, Gabapentin  LIVING ENVIRONMENT: Lives with: alone with 2 dogs  PLOF: Independent, works as a Scientist, water quality  PATIENT GOALS: build up the strength in the R arm and  hand  NEXT MD VISIT: Nov 1st to Dr. Katrinka Blazing  OBJECTIVE:  Note: Objective measures were completed at Evaluation unless otherwise noted.  HAND DOMINANCE: Right  ADLs: Overall ADLs: Pt reports he always tries to use the R arm as able, but in a limited capacity, and is unable to use the R hand with good accuracy or efficiency. Transfers/ambulation related to ADLs: indep Eating: gross grasp in R hand to hold utensils, difficulty cutting food  Grooming: uses L non-dominant hand to shave  UB Dressing: difficulty with clothing fasteners LB Dressing: difficulty with clothing fasteners, extra time to tie shoe laces  Toileting: uses L non-dominant hand for toilet hygiene Bathing: Pt states that he makes himself use the R hand when bathing but control is limited  FUNCTIONAL OUTCOME MEASURES: FOTO: 50; predicted with 56 12/26/22: FOTO: 49  UPPER EXTREMITY ROM:     Active ROM Left eval Right eval Right 12/26/22  Shoulder flexion     Shoulder abduction     Shoulder adduction     Shoulder extension     Shoulder internal rotation     Shoulder external rotation      Elbow flexion     Elbow extension     Wrist flexion     Wrist extension 65 58 60  Wrist ulnar deviation WNL -30 -30  Wrist radial deviation WNL WNL WNL  Wrist pronation 90 90 90  Wrist supination 75 70 75  (Blank rows = not tested)  Active ROM Right eval Left eval  Thumb MCP (0-60)    Thumb IP (0-80)    Thumb Radial abd/add (0-55)     Thumb Palmar abd/add (0-45)     Thumb Opposition to Small Finger     Index MCP (0-90)     Index PIP (0-100)     Index DIP (0-70)      Long MCP (0-90)      Long PIP (0-100)      Long DIP (0-70)      Ring MCP (0-90)      Ring PIP (0-100)      Ring DIP (0-70)      Little MCP (0-90)      Little PIP (0-100)      Little DIP (0-70)      (Blank rows = not tested)  Eval: R hand digits limited in PIP extension in 4th and 5th digits with neutral wrist (lacking ~25% ext in 4th digit PIP and 50% ext in 5th digit PIP) 12/26/22: R hand lacking ~10% ext in 4th digit PIP and lacking ~30% ext in 5th digit PIP  Eval: -limited active digit abd in 4th and 5th digits 12/26/22: able to achieve slight digit abduction in 4th and 5th digits when compensating with wrist and MP flexion    Eval: -Unable to oppose R 4th and 5th digits to thumb  12/26/22: unable to oppose R 4th and 5th digits to thumb  UPPER EXTREMITY MMT:     MMT Left eval Right eval Right 12/26/22  Shoulder flexion     Shoulder abduction     Shoulder adduction     Shoulder extension     Shoulder internal rotation     Shoulder external rotation     Middle trapezius     Lower trapezius     Elbow flexion 5 5   Elbow extension 5 5   Wrist flexion 5 4 4+  Wrist extension 5 4+ (simultaneous radial dev) 4+ (slight radial dev)  Wrist  ulnar deviation 5 1   Wrist radial deviation 5 5   Wrist pronation 5 5   Wrist supination 5 5   (Blank rows = not tested)  HAND FUNCTION: Grip strength: Right: 26 lbs; Left: 95 lbs, Lateral pinch: Right: 7 lbs, Left: 32 lbs, and 3 point pinch: Right: 4 lbs,  Left: 27 lbs 12/12/22: R grip 35 lbs; R lateral pinch: 8 lbs: 3 point pinch: 4 lbs  12/26/22: R grip 33 lbs; R lateral pinch: 8 lbs; 3 point pinch: 4 lbs (Pt reports increased fatigue in R hand today following extensive use of the hand over the weekend when using a tool)  COORDINATION: 9 Hole Peg test: Right: 2 min 39 sec; Left: 26 sec 12/26/22: R 48 secs  SENSATION: Light touch: Impaired ; ulnar nerve distribution  EDEMA: very mild edema surrounding incision sites on R forearm and elbow   COGNITION: Overall cognitive status: Within functional limits for tasks assessed Areas of impairment:  hx of bipolar 1, schizophrenia, and drug and alcohol abuse  OBSERVATIONS:  Pt pleasant, cooperative, and eager to regain strength in his R dominant arm.  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   Note for education/handouts: Pt reports he is limited with his reading and writing ability, but states he will always ask if he doesn't understand something.  TODAY'S TREATMENT:                                                                                                                              DATE: 01/18/23 Therapeutic Exercise: -Facilitated R hand strengthening/flexibility exercises: -Completed passive stretching for R wrist and digit ext, ulnar deviation, R forearm sup --Completed passive palmar add/abd of thumb to base of 5th digit, then active assisted for same -Facilitated pinch strengthening with therapy resistant clothespins (all colors but black) to target 3 point pinch strengthening; vc for ensuring R LF was set up in line with R IF to formulate correct prehension pattern  Therapeutic Activity: Facilitated R forearm, wrist, and hand strengthening with participation in EZ board tools.  Pt worked with long handled tool to facilitate R wrist flex/ext and forearm pron/sup, small and large base key turn, and small and large dial turn x3 reps for  each tool (up/down board=1 rep).  Rest breaks between sets and min vc for minimizing compensatory movements with each tool rotation against wide and narrow strips of velcro resistance.    Neuro re-ed: Facilitated [facility] and dexterity skills with the R hand, working to scoop up to 6 glass stones at a time from dish, store in palm, and discard into a second dish.  Pt practiced picking up stones 1 at a time from table top level, flat side up and down without a non-skid surface using 2 and 3 point pinch patterns.  PATIENT EDUCATION: Education details: HEP progression; R hand small item manipulation skills Person educated: Patient Education method: Explanation  Education comprehension: verbalized understanding  HOME EXERCISE PROGRAM: Yellow theraputty, wrist/forearm strengthening with 2# dumbbell.  GOALS: Goals reviewed with patient? Yes  SHORT TERM GOALS: Target date: 01/02/23  Pt will be indep to perform HEP for improving distal RUE flexibility, strength, and coordination. Baseline: Eval: Not yet initiated; 12/26/22: pt routinely works on resistive hand strengthening exercises with tools and putty at home, as well as stretching wrist and digits, and FMC activities.  Goal status: achieved  LONG TERM GOALS: Target date: 02/13/23  Pt will increase FOTO score to 56 or better to indicate improvement in self perceived functional use of the R arm with daily tasks. Baseline: Eval: 50; 12/26/22: 49 Goal status: ongoing  2.  Pt will increase R grip strength by 10 or more lbs to ease ability carry heavy grocery bags in R dominant hand. Baseline: Eval: R grip 26 lbs (non-dominant L 95 lbs); 12/26/22: R grip 33 lbs Goal status: ongoing  3.  Pt will increase R lateral pinch strength by 5 or more lbs to ease ability to open drink bottles. Baseline: Eval: R 7 lbs (non-dominant L 32 lbs); 12/26/22: R 8 lbs Goal status: ongoing  4.  Pt will tolerate manual therapy, therapeutic modalities, and exercises  to decrease pain in RUE to a reported 4/10 pain or less with activity.   Baseline: Eval: R forearm and hand 6-7/10 pain with activity; 12/26/22: Pt reports increased pain today after increased use of R hand over the weekend; still at 6-7/10 pain with activity. Goal status: ongoing  5.  Pt will increase R hand FMC/dexterity skills to improve efficiency with clothing fasteners as noted by completion of 9 hole peg test in <40 sec (revised on 12/26/22 from <2 min).  Baseline: Eval: 2 min 39 sec; extra time clothing fasteners, specifically buttons and tying shoe laces; 12/26/22: R 48 sec  Goal status: achieved/revised/ongoing  ASSESSMENT: CLINICAL IMPRESSION: Pt reports that he has started to use his R hand to scrub his dishes over the last week.  Pt continues to demonstrate increased strength in the R wrist as demonstrated by maintaining a more neutral wrist during gripping maneuvers, noting only mild flexion at times when gripping.  Pt continues to demo active radial deviation in gravity assisted position, but with limited reps d/t fatigue.  Pt continues to demo weakness in the hand which significantly limits FMC/dexterity skills.  Though improving, pt continues to demonstrate weakness with digit opposition and thumb flexion, which limits ability to move any number of small objects within palm of hand without dropping them.  Pt will continue to benefit from skilled OT to address RUE weakness, coordination deficits, stiffness, and pain in the RUE, in order to work towards increased functional use of the R arm for ADL/IADL/work tasks.    PERFORMANCE DEFICITS: in functional skills including ADLs, IADLs, coordination, dexterity, sensation, edema, ROM, strength, pain, fascial restrictions, muscle spasms, flexibility, Fine motor control, body mechanics, decreased knowledge of use of DME, skin integrity, and UE functional use, and psychosocial skills including coping strategies, environmental adaptation, habits,  and routines and behaviors.   IMPAIRMENTS: are limiting patient from ADLs, IADLs, rest and sleep, work, and leisure.   COMORBIDITIES: has co-morbidities such as hx of 6 cervical spinal surgeries, bipolar 1, schizophrenia  that affects occupational performance. Patient will benefit from skilled OT to address above impairments and improve overall function.  MODIFICATION OR ASSISTANCE TO COMPLETE EVALUATION: No modification of tasks or assist necessary to complete an evaluation.  OT OCCUPATIONAL PROFILE  AND HISTORY: Problem focused assessment: Including review of records relating to presenting problem.  CLINICAL DECISION MAKING: Moderate - several treatment options, min-mod task modification necessary  REHAB POTENTIAL: Good  EVALUATION COMPLEXITY: Moderate      PLAN:  OT FREQUENCY: 2x/week  OT DURATION: 12 weeks  PLANNED INTERVENTIONS: self care/ADL training, therapeutic exercise, therapeutic activity, neuromuscular re-education, manual therapy, scar mobilization, passive range of motion, splinting, electrical stimulation, paraffin, moist heat, cryotherapy, contrast bath, patient/family education, cognitive remediation/compensation, psychosocial skills training, coping strategies training, and DME and/or AE instructions  RECOMMENDED OTHER SERVICES: None at this time  CONSULTED AND AGREED WITH PLAN OF CARE: Patient  PLAN FOR NEXT SESSION: see above  Danelle Earthly, MS, OTR/L  Otis Dials, OT 01/21/2023, 8:46 PM

## 2023-01-23 ENCOUNTER — Ambulatory Visit: Payer: MEDICAID

## 2023-01-23 DIAGNOSIS — M25641 Stiffness of right hand, not elsewhere classified: Secondary | ICD-10-CM

## 2023-01-23 DIAGNOSIS — M6281 Muscle weakness (generalized): Secondary | ICD-10-CM

## 2023-01-23 DIAGNOSIS — R278 Other lack of coordination: Secondary | ICD-10-CM

## 2023-01-23 DIAGNOSIS — M25631 Stiffness of right wrist, not elsewhere classified: Secondary | ICD-10-CM

## 2023-01-23 DIAGNOSIS — G5621 Lesion of ulnar nerve, right upper limb: Secondary | ICD-10-CM

## 2023-01-23 NOTE — Therapy (Signed)
OUTPATIENT OCCUPATIONAL THERAPY ORTHO TREATMENT NOTE  Patient Name: Scott Howell MRN: [id number] DOB:27-Feb-1970, 52 y.o., male Today's Date: 01/23/2023  PCP: Dr. Margarita Mail REFERRING PROVIDER: Dr. Ernestine Mcmurray   END OF SESSION:  OT End of Session - 01/23/23 1343     Visit Number 18    Number of Visits 24    Date for OT Re-Evaluation 02/13/23    Progress Note Due on Visit 10    OT Start Time 1315    OT Stop Time 1400    OT Time Calculation (min) 45 min    Activity Tolerance Patient tolerated treatment well    Behavior During Therapy WFL for tasks assessed/performed            Past Medical History:  Diagnosis Date   Adenomatous polyp of colon    Arthritis    Asthma    Benign fibroma of prostate    Bipolar 1 disorder (HCC)    Cervical myelopathy (HCC)    Cervical post-laminectomy syndrome    Cervical spinal cord compression (HCC)    Chronic knee pain    Chronic pain associated with significant psychosocial dysfunction    Chronic prescription benzodiazepine use    Constipation    COPD (chronic obstructive pulmonary disease) (HCC)    Decreased motor strength    Drug abuse, opioid type (HCC)    Dyspnea    Elevated liver enzymes    Erectile dysfunction    Fibromyalgia    GERD (gastroesophageal reflux disease)    H/O ETOH abuse    Headache    High cholesterol    History of 2019 novel coronavirus disease (COVID-19) 11/01/2021   History of transient cerebral ischemia    Homicidal ideation    Hypertension    Hypokalemia    Living accommodation issues    a.) as of 03/29/2021 --> living in camper   Lower back pain    Lumbar canal stenosis    Marijuana use    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   Pneumonia    Presence of permanent cardiac pacemaker    S/P TKR (total knee replacement)    Schizophrenia (HCC)    Seizures (HCC) 11/2020   Status post hardware removal    Stroke (HCC)    no deficits   Tobacco use    Past Surgical History:   Procedure Laterality Date   ANTERIOR CERVICAL DECOMP/DISCECTOMY FUSION N/A 01/13/2021   Procedure: C3-5 ANTERIOR CERVICAL DECOMPRESSION/DISCECTOMY FUSION;  Surgeon: Venetia Night, MD;  Location: [facility] ORS;  Service: Neurosurgery;  Laterality: N/A;   CERVICAL SPINE SURGERY     4   CLAVICLE SURGERY     COLONOSCOPY WITH PROPOFOL N/A 06/29/2022   Procedure: COLONOSCOPY WITH PROPOFOL;  Surgeon: Wyline Mood, MD;  Location: [facility] ENDOSCOPY;  Service: Gastroenterology;  Laterality: N/A;  USES MED TRANS; WILL NEED TIME ON FRIDAY, 06/24/2022   HARDWARE REMOVAL Left 04/20/2015   Procedure: HARDWARE REMOVAL left leg;  Surgeon: Deeann Saint, MD;  Location: [facility] ORS;  Service: Orthopedics;  Laterality: Left;   KNEE SURGERY Left    NECK SURGERY     x6   SHOULDER SURGERY Left    TOTAL KNEE ARTHROPLASTY Left 04/12/2021   Procedure: TOTAL KNEE ARTHROPLASTY;  Surgeon: Lyndle Herrlich, MD;  Location: [facility] ORS;  Service: Orthopedics;  Laterality: Left;   ULNAR NERVE TRANSPOSITION Right 11/17/2021   Procedure: SUBCUTANEOUS TRANSPOSITION OF ULNAR NERVE, RIGHT ELBOW;  Surgeon: Christena Flake, MD;  Location: [facility]  ORS;  Service: Orthopedics;  Laterality: Right;   ULNAR NERVE TRANSPOSITION Right 10/04/2022   Procedure: ULNAR NERVE TRANSPOSITION;  Surgeon: Lovenia Kim, MD;  Location: [facility] ORS;  Service: Neurosurgery;  Laterality: Right;  NO BLOCK   Patient Active Problem List   Diagnosis Date Noted   Ulnar neuropathy of right upper extremity 10/05/2022   Hand weakness 10/05/2022   Nerve pain 10/05/2022   Adenomatous polyp of colon 06/29/2022   S/P TKR (total knee replacement) using cement, left 04/12/2021   Cervical myelopathy (HCC) 01/13/2021   Seizure (HCC) 11/26/2020   Abnormal thyroid blood test 04/06/2016   Encounter for screening colonoscopy 01/05/2016   Abnormal weight gain 01/05/2016   Chronic prescription benzodiazepine use 10/19/2015   Elevated liver enzymes 08/20/2015   Medication monitoring  encounter 08/20/2015   Hypokalemia 08/20/2015   Erectile dysfunction 04/22/2015   Status post hardware removal 04/20/2015   Bipolar 2 disorder (HCC) 07/29/2014   Marijuana abuse 07/29/2014   Chronic knee pain 07/29/2014   Bipolar 1 disorder, mixed (HCC)    Drug abuse, opioid type (HCC) 06/28/2013   GERD without esophagitis 10/03/2012   Hypertension goal BP (blood pressure) < 140/90 10/03/2012   Chronic pain associated with significant psychosocial dysfunction 07/17/2012   Lumbar canal stenosis 04/09/2012   Hypercholesterolemia 01/06/2012   Constipation 11/11/2011   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/2010   Decreased motor strength 09/30/2010   Current tobacco use 09/30/2010   H/O transient cerebral ischemia 09/09/2010   Low back pain 08/30/2010   Cervical spinal cord compression (HCC) 07/22/2010   Cervical post-laminectomy syndrome 07/22/2010   Chronic obstructive pulmonary disease (HCC) 02/23/2010   ONSET DATE: 10/04/22 (most recent sx)  REFERRING DIAG: G56.21 (ICD-10-CM) - Ulnar neuropathy of right upper extremity   THERAPY DIAG:  Muscle weakness (generalized)  Other lack of coordination  Ulnar neuropathy at elbow of right upper extremity  Stiffness of right wrist, not elsewhere classified  Stiffness of right hand, not elsewhere classified  Rationale for Evaluation and Treatment: Rehabilitation  SUBJECTIVE:  SUBJECTIVE STATEMENT: Pt reports having a good weekend. Pt accompanied by: self  PERTINENT HISTORY:  Per note from Dr. Katrinka Blazing on 11/16/22: [name] is 2 weeks status post Ulnar nerve decompression, AIN to ulnar nerve transfer, and ulnar nerve peripheral nerve stimulator placement. Given oxycodone on discharge from the hospital.   Per medical record, pt underwent first ulnar nerve transposition on 11/17/21 and participated in OT post surgery.  2nd ulnar nerve transposition done on 10/04/22.  Pt had nerve stimulator placed but pt reports he does not like how it  feels so he does not turn it on.  Pt also with hx of 6 cervical spinal surgeries; see above for additional hx.    PRECAUTIONS: None  RED FLAGS: None   WEIGHT BEARING RESTRICTIONS: No  PAIN: 01/23/23: R wrist/hand 5/10 pain, R arm and hand 5-7/10 pain with activity Are you having pain? Yes: NPRS scale: 2/10 at rest, with activity 6-7/10 Pain location: ulnar side of R arm extending from elbow to hand Pain description: pins and needles, occasional spasms, numbness Aggravating factors: "my arm doesn't like the cold, increased activity  Relieving factors: heat, rest, Gabapentin  LIVING ENVIRONMENT: Lives with: alone with 2 dogs  PLOF: Independent, works as a Scientist, water quality  PATIENT GOALS: build up the strength in the R arm and hand  NEXT MD VISIT: Nov 1st to Dr. Katrinka Blazing  OBJECTIVE:  Note: Objective measures were completed at Evaluation unless otherwise noted.  HAND DOMINANCE: Right  ADLs: Overall ADLs: Pt reports he always tries to use the R arm as able, but in a limited capacity, and is unable to use the R hand with good accuracy or efficiency. Transfers/ambulation related to ADLs: indep Eating: gross grasp in R hand to hold utensils, difficulty cutting food  Grooming: uses L non-dominant hand to shave  UB Dressing: difficulty with clothing fasteners LB Dressing: difficulty with clothing fasteners, extra time to tie shoe laces  Toileting: uses L non-dominant hand for toilet hygiene Bathing: Pt states that he makes himself use the R hand when bathing but control is limited  FUNCTIONAL OUTCOME MEASURES: FOTO: 50; predicted with 56 12/26/22: FOTO: 49  UPPER EXTREMITY ROM:     Active ROM Left eval Right eval Right 12/26/22  Shoulder flexion     Shoulder abduction     Shoulder adduction     Shoulder extension     Shoulder internal rotation     Shoulder external rotation     Elbow flexion     Elbow extension     Wrist flexion     Wrist extension 65 58 60  Wrist ulnar  deviation WNL -30 -30  Wrist radial deviation WNL WNL WNL  Wrist pronation 90 90 90  Wrist supination 75 70 75  (Blank rows = not tested)  Active ROM Right eval Left eval  Thumb MCP (0-60)    Thumb IP (0-80)    Thumb Radial abd/add (0-55)     Thumb Palmar abd/add (0-45)     Thumb Opposition to Small Finger     Index MCP (0-90)     Index PIP (0-100)     Index DIP (0-70)      Long MCP (0-90)      Long PIP (0-100)      Long DIP (0-70)      Ring MCP (0-90)      Ring PIP (0-100)      Ring DIP (0-70)      Little MCP (0-90)      Little PIP (0-100)      Little DIP (0-70)      (Blank rows = not tested)  Eval: R hand digits limited in PIP extension in 4th and 5th digits with neutral wrist (lacking ~25% ext in 4th digit PIP and 50% ext in 5th digit PIP) 12/26/22: R hand lacking ~10% ext in 4th digit PIP and lacking ~30% ext in 5th digit PIP  Eval: -limited active digit abd in 4th and 5th digits 12/26/22: able to achieve slight digit abduction in 4th and 5th digits when compensating with wrist and MP flexion    Eval: -Unable to oppose R 4th and 5th digits to thumb  12/26/22: unable to oppose R 4th and 5th digits to thumb  UPPER EXTREMITY MMT:     MMT Left eval Right eval Right 12/26/22  Shoulder flexion     Shoulder abduction     Shoulder adduction     Shoulder extension     Shoulder internal rotation     Shoulder external rotation     Middle trapezius     Lower trapezius     Elbow flexion 5 5   Elbow extension 5 5   Wrist flexion 5 4 4+  Wrist extension 5 4+ (simultaneous radial dev) 4+ (slight radial dev)  Wrist ulnar deviation 5 1   Wrist radial deviation 5 5   Wrist pronation 5 5   Wrist supination 5 5   (  Blank rows = not tested)  HAND FUNCTION: Grip strength: Right: 26 lbs; Left: 95 lbs, Lateral pinch: Right: 7 lbs, Left: 32 lbs, and 3 point pinch: Right: 4 lbs, Left: 27 lbs 12/12/22: R grip 35 lbs; R lateral pinch: 8 lbs: 3 point pinch: 4 lbs  12/26/22: R  grip 33 lbs; R lateral pinch: 8 lbs; 3 point pinch: 4 lbs (Pt reports increased fatigue in R hand today following extensive use of the hand over the weekend when using a tool)  COORDINATION: 9 Hole Peg test: Right: 2 min 39 sec; Left: 26 sec 12/26/22: R 48 secs  SENSATION: Light touch: Impaired ; ulnar nerve distribution  EDEMA: very mild edema surrounding incision sites on R forearm and elbow   COGNITION: Overall cognitive status: Within functional limits for tasks assessed Areas of impairment:  hx of bipolar 1, schizophrenia, and drug and alcohol abuse  OBSERVATIONS:  Pt pleasant, cooperative, and eager to regain strength in his R dominant arm.  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   Note for education/handouts: Pt reports he is limited with his reading and writing ability, but states he will always ask if he doesn't understand something.  TODAY'S TREATMENT:                                                                                                                              DATE: 01/23/23 Therapeutic Exercise: -Facilitated R hand strengthening/flexibility exercises: -Completed passive stretching for R wrist and digit ext, ulnar deviation, R forearm sup --Completed passive palmar add/abd of thumb to base of 5th digit, then active assisted for same -Facilitated pinch strengthening with therapy resistant clothespins (all colors but black) to target 3 point pinch strengthening; vc for ensuring R LF was set up in line with R IF to formulate correct prehension pattern Facilitated hand strengthening with use of hand gripper set at 17.9# to place/remove jumbo pegs from pegboard x4 trials using R hand; min vc to reduce R flexion during gripping reps -Completed active assisted R radial deviation x2 sets 10 reps each  Neuro re-ed: Practiced R hand FMC/dexterity skills, working to reposition small items within fingertips.  Pt practiced  picking up a jumbo peg horizontally on table top, and repositioning peg vertically within fingertips in order to place peg into pegboard which was placed on an incline.  Incline used to challenge wrist extension.  Pegboard returned to table top level (no incline) and pt practiced item storage and translatory movements with 2 pegs in hand.  OT provided intermittent min vc to move pegs out of radial side of hand to further engage the thumb.   PATIENT EDUCATION: Education details: R Musician Person educated: Patient Education method: Explanation Education comprehension: verbalized understanding  HOME EXERCISE PROGRAM: Yellow theraputty, wrist/forearm strengthening with 2# dumbbell.  GOALS: Goals reviewed with patient? Yes  SHORT TERM GOALS: Target date: 01/02/23  Pt will be indep to perform HEP for improving distal RUE flexibility, strength, and coordination. Baseline: Eval: Not yet initiated; 12/26/22: pt routinely works on resistive hand strengthening exercises with tools and putty at home, as well as stretching wrist and digits, and FMC activities.  Goal status: achieved  LONG TERM GOALS: Target date: 02/13/23  Pt will increase FOTO score to 56 or better to indicate improvement in self perceived functional use of the R arm with daily tasks. Baseline: Eval: 50; 12/26/22: 49 Goal status: ongoing  2.  Pt will increase R grip strength by 10 or more lbs to ease ability carry heavy grocery bags in R dominant hand. Baseline: Eval: R grip 26 lbs (non-dominant L 95 lbs); 12/26/22: R grip 33 lbs Goal status: ongoing  3.  Pt will increase R lateral pinch strength by 5 or more lbs to ease ability to open drink bottles. Baseline: Eval: R 7 lbs (non-dominant L 32 lbs); 12/26/22: R 8 lbs Goal status: ongoing  4.  Pt will tolerate manual therapy, therapeutic modalities, and exercises to decrease pain in RUE to a reported 4/10 pain or less with activity.   Baseline: Eval:  R forearm and hand 6-7/10 pain with activity; 12/26/22: Pt reports increased pain today after increased use of R hand over the weekend; still at 6-7/10 pain with activity. Goal status: ongoing  5.  Pt will increase R hand FMC/dexterity skills to improve efficiency with clothing fasteners as noted by completion of 9 hole peg test in <40 sec (revised on 12/26/22 from <2 min).  Baseline: Eval: 2 min 39 sec; extra time clothing fasteners, specifically buttons and tying shoe laces; 12/26/22: R 48 sec  Goal status: achieved/revised/ongoing  ASSESSMENT: CLINICAL IMPRESSION: Pt with good tolerance to all therapeutic exercises and neuro re-ed activities this date, allowing intermittent rest breaks with heat and passive wrist and digit extension stretching to reduce cramping in the hand.  Pt tolerated placing and removing jumbo pegs from pegboard with hand gripper set at 17.9#.  Pt tends to begin dropping pegs on last round d/t fatigue (4 trials counted as 1 trial to place pegs, 1 trial to remove pegs x2).  Pt continues to demo weakness in the hand which significantly limits FMC/dexterity skills.  Though improving, pt continues to demonstrate weakness with digit opposition and thumb flexion, which limits ability to move any number of small objects within palm of hand without dropping them.  Pt practiced manipulation of 1 and 2 jumbo pegs at time, working on moving pegs from horizontal to vertical position, and moving 1 at a time out of radial side of hand without dropping the other from palm.  Pt struggles to perform thumb palmar abduction to help with moving items in palm when other digits are flexed.  Pt will continue to benefit from skilled OT to address RUE weakness, coordination deficits, stiffness, and pain in the RUE, in order to work towards increased functional use of the R arm for ADL/IADL/work tasks.    PERFORMANCE DEFICITS: in functional skills including ADLs, IADLs, coordination, dexterity, sensation,  edema, ROM, strength, pain, fascial restrictions, muscle spasms, flexibility, Fine motor control, body mechanics, decreased knowledge of use of DME, skin integrity, and UE functional use, and psychosocial skills including coping strategies, environmental adaptation, habits, and routines and behaviors.   IMPAIRMENTS: are limiting patient from ADLs, IADLs, rest and sleep, work, and leisure.   COMORBIDITIES: has co-morbidities such as hx of 6 cervical spinal surgeries, bipolar 1, schizophrenia  that affects  occupational performance. Patient will benefit from skilled OT to address above impairments and improve overall function.  MODIFICATION OR ASSISTANCE TO COMPLETE EVALUATION: No modification of tasks or assist necessary to complete an evaluation.  OT OCCUPATIONAL PROFILE AND HISTORY: Problem focused assessment: Including review of records relating to presenting problem.  CLINICAL DECISION MAKING: Moderate - several treatment options, min-mod task modification necessary  REHAB POTENTIAL: Good  EVALUATION COMPLEXITY: Moderate      PLAN:  OT FREQUENCY: 2x/week  OT DURATION: 12 weeks  PLANNED INTERVENTIONS: self care/ADL training, therapeutic exercise, therapeutic activity, neuromuscular re-education, manual therapy, scar mobilization, passive range of motion, splinting, electrical stimulation, paraffin, moist heat, cryotherapy, contrast bath, patient/family education, cognitive remediation/compensation, psychosocial skills training, coping strategies training, and DME and/or AE instructions  RECOMMENDED OTHER SERVICES: None at this time  CONSULTED AND AGREED WITH PLAN OF CARE: Patient  PLAN FOR NEXT SESSION: see above  Danelle Earthly, MS, OTR/L  Otis Dials, OT 01/23/2023, 1:49 PM

## 2023-01-25 ENCOUNTER — Ambulatory Visit: Payer: MEDICAID

## 2023-01-25 DIAGNOSIS — M25641 Stiffness of right hand, not elsewhere classified: Secondary | ICD-10-CM

## 2023-01-25 DIAGNOSIS — M6281 Muscle weakness (generalized): Secondary | ICD-10-CM

## 2023-01-25 DIAGNOSIS — M25631 Stiffness of right wrist, not elsewhere classified: Secondary | ICD-10-CM

## 2023-01-25 DIAGNOSIS — R278 Other lack of coordination: Secondary | ICD-10-CM

## 2023-01-25 DIAGNOSIS — G5621 Lesion of ulnar nerve, right upper limb: Secondary | ICD-10-CM

## 2023-01-25 NOTE — Therapy (Signed)
OUTPATIENT OCCUPATIONAL THERAPY ORTHO TREATMENT NOTE  Patient Name: Scott Howell MRN: [id number] DOB:05-31-1970, 52 y.o., male Today's Date: 01/29/2023  PCP: Dr. Margarita Howell REFERRING PROVIDER: Dr. Ernestine Howell   END OF SESSION:  OT End of Session - 01/25/23 1359       Visit Number 19     Number of Visits 24     Date for OT Re-Evaluation 02/13/23     Progress Note Due on Visit 10     OT Start Time 1350     OT Stop Time 1448    OT Time Calculation (min) 58 min     Activity Tolerance Patient tolerated treatment well     Behavior During Therapy WFL for tasks assessed/performed     Past Medical History:  Diagnosis Date   Adenomatous polyp of colon    Arthritis    Asthma    Benign fibroma of prostate    Bipolar 1 disorder (HCC)    Cervical myelopathy (HCC)    Cervical post-laminectomy syndrome    Cervical spinal cord compression (HCC)    Chronic knee pain    Chronic pain associated with significant psychosocial dysfunction    Chronic prescription benzodiazepine use    Constipation    COPD (chronic obstructive pulmonary disease) (HCC)    Decreased motor strength    Drug abuse, opioid type (HCC)    Dyspnea    Elevated liver enzymes    Erectile dysfunction    Fibromyalgia    GERD (gastroesophageal reflux disease)    H/O ETOH abuse    Headache    High cholesterol    History of 2019 novel coronavirus disease (COVID-19) 11/01/2021   History of transient cerebral ischemia    Homicidal ideation    Hypertension    Hypokalemia    Living accommodation issues    a.) as of 03/29/2021 --> living in camper   Lower back pain    Lumbar canal stenosis    Marijuana use    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   Pneumonia    Presence of permanent cardiac pacemaker    S/P TKR (total knee replacement)    Schizophrenia (HCC)    Seizures (HCC) 11/2020   Status post hardware removal    Stroke (HCC)    no deficits   Tobacco use    Past Surgical History:   Procedure Laterality Date   ANTERIOR CERVICAL DECOMP/DISCECTOMY FUSION N/A 01/13/2021   Procedure: C3-5 ANTERIOR CERVICAL DECOMPRESSION/DISCECTOMY FUSION;  Surgeon: Scott Night, MD;  Location: [facility];  Service: Neurosurgery;  Laterality: N/A;   CERVICAL SPINE SURGERY     4   CLAVICLE SURGERY     COLONOSCOPY WITH PROPOFOL N/A 06/29/2022   Procedure: COLONOSCOPY WITH PROPOFOL;  Surgeon: Scott Mood, MD;  Location: [facility];  Service: Gastroenterology;  Laterality: N/A;  USES MED TRANS; WILL NEED TIME ON FRIDAY, 06/24/2022   HARDWARE REMOVAL Left 04/20/2015   Procedure: HARDWARE REMOVAL left leg;  Surgeon: Scott Saint, MD;  Location: [facility];  Service: Orthopedics;  Laterality: Left;   KNEE SURGERY Left    NECK SURGERY     x6   SHOULDER SURGERY Left    TOTAL KNEE ARTHROPLASTY Left 04/12/2021   Procedure: TOTAL KNEE ARTHROPLASTY;  Surgeon: Scott Herrlich, MD;  Location: [facility];  Service: Orthopedics;  Laterality: Left;   ULNAR NERVE TRANSPOSITION Right 11/17/2021   Procedure: SUBCUTANEOUS TRANSPOSITION OF ULNAR NERVE, RIGHT ELBOW;  Surgeon: Scott Flake, MD;  Location: [facility];  Service: Orthopedics;  Laterality: Right;   ULNAR NERVE TRANSPOSITION Right 10/04/2022   Procedure: ULNAR NERVE TRANSPOSITION;  Surgeon: Scott Kim, MD;  Location: [facility];  Service: Neurosurgery;  Laterality: Right;  NO BLOCK   Patient Active Problem List   Diagnosis Date Noted   Ulnar neuropathy of right upper extremity 10/05/2022   Hand weakness 10/05/2022   Nerve pain 10/05/2022   Adenomatous polyp of colon 06/29/2022   S/P TKR (total knee replacement) using cement, left 04/12/2021   Cervical myelopathy (HCC) 01/13/2021   Seizure (HCC) 11/26/2020   Abnormal thyroid blood test 04/06/2016   Encounter for screening colonoscopy 01/05/2016   Abnormal weight gain 01/05/2016   Chronic prescription benzodiazepine use 10/19/2015   Elevated liver enzymes 08/20/2015   Medication monitoring  encounter 08/20/2015   Hypokalemia 08/20/2015   Erectile dysfunction 04/22/2015   Status post hardware removal 04/20/2015   Bipolar 2 disorder (HCC) 07/29/2014   Marijuana abuse 07/29/2014   Chronic knee pain 07/29/2014   Bipolar 1 disorder, mixed (HCC)    Drug abuse, opioid type (HCC) 06/28/2013   GERD without esophagitis 10/03/2012   Hypertension goal BP (blood pressure) < 140/90 10/03/2012   Chronic pain associated with significant psychosocial dysfunction 07/17/2012   Lumbar canal stenosis 04/09/2012   Hypercholesterolemia 01/06/2012   Constipation 11/11/2011   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/2010   Decreased motor strength 09/30/2010   Current tobacco use 09/30/2010   H/O transient cerebral ischemia 09/09/2010   Low back pain 08/30/2010   Cervical spinal cord compression (HCC) 07/22/2010   Cervical post-laminectomy syndrome 07/22/2010   Chronic obstructive pulmonary disease (HCC) 02/23/2010   ONSET DATE: 10/04/22 (most recent sx)  REFERRING DIAG: G56.21 (ICD-10-CM) - Ulnar neuropathy of right upper extremity   THERAPY DIAG:  Muscle weakness (generalized)  Other lack of coordination  Ulnar neuropathy at elbow of right upper extremity  Stiffness of right hand, not elsewhere classified  Stiffness of right wrist, not elsewhere classified  Rationale for Evaluation and Treatment: Rehabilitation  SUBJECTIVE:  SUBJECTIVE STATEMENT: Pt reports doing well today. Pt accompanied by: self  PERTINENT HISTORY:  Per note from Dr. Katrinka Howell on 11/16/22: Scott Howell is 2 weeks status post Ulnar nerve decompression, AIN to ulnar nerve transfer, and ulnar nerve peripheral nerve stimulator placement. Given oxycodone on discharge from the hospital.   Per medical record, pt underwent first ulnar nerve transposition on 11/17/21 and participated in OT post surgery.  2nd ulnar nerve transposition done on 10/04/22.  Pt had nerve stimulator placed but pt reports he does not like how it feels  so he does not turn it on.  Pt also with hx of 6 cervical spinal surgeries; see above for additional hx.    PRECAUTIONS: None  RED FLAGS: None   WEIGHT BEARING RESTRICTIONS: No  PAIN: 01/25/23: R wrist/hand 5/10 pain, R arm and hand 5-7/10 pain with activity Are you having pain? Yes: NPRS scale: 2/10 at rest, with activity 6-7/10 Pain location: ulnar side of R arm extending from elbow to hand Pain description: pins and needles, occasional spasms, numbness Aggravating factors: "my arm doesn't like the cold, increased activity  Relieving factors: heat, rest, Gabapentin  LIVING ENVIRONMENT: Lives with: alone with 2 dogs  PLOF: Independent, works as a Scientist, water quality  PATIENT GOALS: build up the strength in the R arm and hand  NEXT MD VISIT: Nov 1st to Dr. Katrinka Howell  OBJECTIVE:  Note: Objective measures were completed at Evaluation unless otherwise  noted.  HAND DOMINANCE: Right  ADLs: Overall ADLs: Pt reports he always tries to use the R arm as able, but in a limited capacity, and is unable to use the R hand with good accuracy or efficiency. Transfers/ambulation related to ADLs: indep Eating: gross grasp in R hand to hold utensils, difficulty cutting food  Grooming: uses L non-dominant hand to shave  UB Dressing: difficulty with clothing fasteners LB Dressing: difficulty with clothing fasteners, extra time to tie shoe laces  Toileting: uses L non-dominant hand for toilet hygiene Bathing: Pt states that he makes himself use the R hand when bathing but control is limited  FUNCTIONAL OUTCOME MEASURES: FOTO: 50; predicted with 56 12/26/22: FOTO: 49  UPPER EXTREMITY ROM:     Active ROM Left eval Right eval Right 12/26/22  Shoulder flexion     Shoulder abduction     Shoulder adduction     Shoulder extension     Shoulder internal rotation     Shoulder external rotation     Elbow flexion     Elbow extension     Wrist flexion     Wrist extension 65 58 60  Wrist ulnar deviation  WNL -30 -30  Wrist radial deviation WNL WNL WNL  Wrist pronation 90 90 90  Wrist supination 75 70 75  (Blank rows = not tested)  Active ROM Right eval Left eval  Thumb MCP (0-60)    Thumb IP (0-80)    Thumb Radial abd/add (0-55)     Thumb Palmar abd/add (0-45)     Thumb Opposition to Small Finger     Index MCP (0-90)     Index PIP (0-100)     Index DIP (0-70)      Long MCP (0-90)      Long PIP (0-100)      Long DIP (0-70)      Ring MCP (0-90)      Ring PIP (0-100)      Ring DIP (0-70)      Little MCP (0-90)      Little PIP (0-100)      Little DIP (0-70)      (Blank rows = not tested)  Eval: R hand digits limited in PIP extension in 4th and 5th digits with neutral wrist (lacking ~25% ext in 4th digit PIP and 50% ext in 5th digit PIP) 12/26/22: R hand lacking ~10% ext in 4th digit PIP and lacking ~30% ext in 5th digit PIP  Eval: -limited active digit abd in 4th and 5th digits 12/26/22: able to achieve slight digit abduction in 4th and 5th digits when compensating with wrist and MP flexion    Eval: -Unable to oppose R 4th and 5th digits to thumb  12/26/22: unable to oppose R 4th and 5th digits to thumb  UPPER EXTREMITY MMT:     MMT Left eval Right eval Right 12/26/22  Shoulder flexion     Shoulder abduction     Shoulder adduction     Shoulder extension     Shoulder internal rotation     Shoulder external rotation     Middle trapezius     Lower trapezius     Elbow flexion 5 5   Elbow extension 5 5   Wrist flexion 5 4 4+  Wrist extension 5 4+ (simultaneous radial dev) 4+ (slight radial dev)  Wrist ulnar deviation 5 1   Wrist radial deviation 5 5   Wrist pronation 5 5   Wrist supination 5  5   (Blank rows = not tested)  HAND FUNCTION: Grip strength: Right: 26 lbs; Left: 95 lbs, Lateral pinch: Right: 7 lbs, Left: 32 lbs, and 3 point pinch: Right: 4 lbs, Left: 27 lbs 12/12/22: R grip 35 lbs; R lateral pinch: 8 lbs: 3 point pinch: 4 lbs  12/26/22: R grip 33 lbs;  R lateral pinch: 8 lbs; 3 point pinch: 4 lbs (Pt reports increased fatigue in R hand today following extensive use of the hand over the weekend when using a tool)  COORDINATION: 9 Hole Peg test: Right: 2 min 39 sec; Left: 26 sec 12/26/22: R 48 secs  SENSATION: Light touch: Impaired ; ulnar nerve distribution  EDEMA: very mild edema surrounding incision sites on R forearm and elbow   COGNITION: Overall cognitive status: Within functional limits for tasks assessed Areas of impairment:  hx of bipolar 1, schizophrenia, and drug and alcohol abuse  OBSERVATIONS:  Pt pleasant, cooperative, and eager to regain strength in his R dominant arm.  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   Note for education/handouts: Pt reports he is limited with his reading and writing ability, but states he will always ask if he doesn't understand something.  TODAY'S TREATMENT:                                                                                                                              DATE: 01/25/23 Therapeutic Exercise: -Facilitated R hand strengthening/flexibility exercises: -Completed passive stretching for R wrist and digit ext, ulnar deviation, R forearm sup --Completed passive palmar add/abd of thumb to base of 5th digit, then active assisted for same -Facilitated pinch strengthening with therapy resistant clothespins (all colors but black) to target 3 point pinch strengthening; vc for ensuring R LF was set up in line with R IF to formulate correct prehension pattern -Completed active assisted R radial deviation x2 sets 10 reps each -Performed proximal ulnar nerve glides with pt initially requiring mod A to achieve desired position with each positional change (3 step sequence).  Pt then used wall to achieve wrist and digit ext with R elbow extended and shoulder abducted to 90 for 2nd step of sequence to achieve desired position.  After several  reps, pt able to complete with min vc only.  Therapeutic Activity: Facilitated R forearm, wrist, and hand strengthening with participation in EZ board tools.  Pt worked with long handled tool to facilitate R wrist flex/ext, small and large base key turn, and small and large dial turn x3 reps for each tool (up/down board=1 rep).  Rest breaks between sets and min vc for technique to maximize ROM with each tool rotation against narrow and wide strips of velcro resistance.    Manual Therapy: Performed soft tissue massage at R medial elbow, triceps tendon, and ulnar side of forearm for increasing circulation, and reducing pain and stiffness to ulnar side of R arm.  Scar massage completed to reduce risk of scar adhesions to R medial elbow incision.  PATIENT EDUCATION: Education details: Insurance underwriter nerve glides Person educated: Patient Education method: Explanation Education comprehension: verbalized understanding, demonstrated understanding with vc and tactile cues  HOME EXERCISE PROGRAM: Yellow theraputty, wrist/forearm strengthening with 2# dumbbell, ulnar nerve glides  GOALS: Goals reviewed with patient? Yes  SHORT TERM GOALS: Target date: 01/02/23  Pt will be indep to perform HEP for improving distal RUE flexibility, strength, and coordination. Baseline: Eval: Not yet initiated; 12/26/22: pt routinely works on resistive hand strengthening exercises with tools and putty at home, as well as stretching wrist and digits, and FMC activities.  Goal status: achieved  LONG TERM GOALS: Target date: 02/13/23  Pt will increase FOTO score to 56 or better to indicate improvement in self perceived functional use of the R arm with daily tasks. Baseline: Eval: 50; 12/26/22: 49 Goal status: ongoing  2.  Pt will increase R grip strength by 10 or more lbs to ease ability carry heavy grocery bags in R dominant hand. Baseline: Eval: R grip 26 lbs (non-dominant L 95 lbs); 12/26/22: R grip 33 lbs Goal status:  ongoing  3.  Pt will increase R lateral pinch strength by 5 or more lbs to ease ability to open drink bottles. Baseline: Eval: R 7 lbs (non-dominant L 32 lbs); 12/26/22: R 8 lbs Goal status: ongoing  4.  Pt will tolerate manual therapy, therapeutic modalities, and exercises to decrease pain in RUE to a reported 4/10 pain or less with activity.   Baseline: Eval: R forearm and hand 6-7/10 pain with activity; 12/26/22: Pt reports increased pain today after increased use of R hand over the weekend; still at 6-7/10 pain with activity. Goal status: ongoing  5.  Pt will increase R hand FMC/dexterity skills to improve efficiency with clothing fasteners as noted by completion of 9 hole peg test in <40 sec (revised on 12/26/22 from <2 min).  Baseline: Eval: 2 min 39 sec; extra time clothing fasteners, specifically buttons and tying shoe laces; 12/26/22: R 48 sec  Goal status: achieved/revised/ongoing  ASSESSMENT: CLINICAL IMPRESSION: Pt initially with limited tolerance to ulnar nerve glides this date, but improved with slow reps and therapist assist to reduce over stretching.  Fair tolerance to manual therapy this date.  R medial elbow, triceps, and ulnar side of forearm remain tender to massage.  Pt continues to demo improved tolerance for strengthening R hand as demonstrated by fewer rest breaks with clothespins and AAROM at the wrist and hand.  Pt will continue to benefit from skilled OT to address RUE weakness, coordination deficits, stiffness, and pain in the RUE, in order to work towards increased functional use of the R arm for ADL/IADL/work tasks.    PERFORMANCE DEFICITS: in functional skills including ADLs, IADLs, coordination, dexterity, sensation, edema, ROM, strength, pain, fascial restrictions, muscle spasms, flexibility, Fine motor control, body mechanics, decreased knowledge of use of DME, skin integrity, and UE functional use, and psychosocial skills including coping strategies, environmental  adaptation, habits, and routines and behaviors.   IMPAIRMENTS: are limiting patient from ADLs, IADLs, rest and sleep, work, and leisure.   COMORBIDITIES: has co-morbidities such as hx of 6 cervical spinal surgeries, bipolar 1, schizophrenia  that affects occupational performance. Patient will benefit from skilled OT to address above impairments and improve overall function.  MODIFICATION OR ASSISTANCE TO COMPLETE EVALUATION: No modification of tasks or assist necessary to complete an evaluation.  OT OCCUPATIONAL PROFILE AND HISTORY: Problem  focused assessment: Including review of records relating to presenting problem.  CLINICAL DECISION MAKING: Moderate - several treatment options, min-mod task modification necessary  REHAB POTENTIAL: Good  EVALUATION COMPLEXITY: Moderate      PLAN:  OT FREQUENCY: 2x/week  OT DURATION: 12 weeks  PLANNED INTERVENTIONS: self care/ADL training, therapeutic exercise, therapeutic activity, neuromuscular re-education, manual therapy, scar mobilization, passive range of motion, splinting, electrical stimulation, paraffin, moist heat, cryotherapy, contrast bath, patient/family education, cognitive remediation/compensation, psychosocial skills training, coping strategies training, and DME and/or AE instructions  RECOMMENDED OTHER SERVICES: None at this time  CONSULTED AND AGREED WITH PLAN OF CARE: Patient  PLAN FOR NEXT SESSION: see above  Danelle Earthly, MS, OTR/L  Otis Dials, OT 01/29/2023, 1:51 PM

## 2023-01-30 ENCOUNTER — Ambulatory Visit: Payer: MEDICAID

## 2023-01-30 DIAGNOSIS — M6281 Muscle weakness (generalized): Secondary | ICD-10-CM | POA: Diagnosis not present

## 2023-01-30 DIAGNOSIS — R278 Other lack of coordination: Secondary | ICD-10-CM

## 2023-01-30 DIAGNOSIS — G5621 Lesion of ulnar nerve, right upper limb: Secondary | ICD-10-CM

## 2023-01-30 DIAGNOSIS — M25641 Stiffness of right hand, not elsewhere classified: Secondary | ICD-10-CM

## 2023-01-30 DIAGNOSIS — M25631 Stiffness of right wrist, not elsewhere classified: Secondary | ICD-10-CM

## 2023-01-30 NOTE — Therapy (Signed)
OUTPATIENT OCCUPATIONAL THERAPY ORTHO TREATMENT NOTE  Patient Name: Scott Howell MRN: [id number] DOB:10/03/1970, 52 y.o., male Today's Date: 01/30/2023  PCP: Dr. Margarita Mail REFERRING PROVIDER: Dr. Ernestine Mcmurray   END OF SESSION:  OT End of Session - 01/30/23 1448     Visit Number 19    Number of Visits 24    Date for OT Re-Evaluation 02/13/23    Progress Note Due on Visit 10    OT Start Time 1400    OT Stop Time 1445    OT Time Calculation (min) 45 min    Activity Tolerance Patient tolerated treatment well    Behavior During Therapy WFL for tasks assessed/performed            Past Medical History:  Diagnosis Date   Adenomatous polyp of colon    Arthritis    Asthma    Benign fibroma of prostate    Bipolar 1 disorder (HCC)    Cervical myelopathy (HCC)    Cervical post-laminectomy syndrome    Cervical spinal cord compression (HCC)    Chronic knee pain    Chronic pain associated with significant psychosocial dysfunction    Chronic prescription benzodiazepine use    Constipation    COPD (chronic obstructive pulmonary disease) (HCC)    Decreased motor strength    Drug abuse, opioid type (HCC)    Dyspnea    Elevated liver enzymes    Erectile dysfunction    Fibromyalgia    GERD (gastroesophageal reflux disease)    H/O ETOH abuse    Headache    High cholesterol    History of 2019 novel coronavirus disease (COVID-19) 11/01/2021   History of transient cerebral ischemia    Homicidal ideation    Hypertension    Hypokalemia    Living accommodation issues    a.) as of 03/29/2021 --> living in camper   Lower back pain    Lumbar canal stenosis    Marijuana use    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   Pneumonia    Presence of permanent cardiac pacemaker    S/P TKR (total knee replacement)    Schizophrenia (HCC)    Seizures (HCC) 11/2020   Status post hardware removal    Stroke (HCC)    no deficits   Tobacco use    Past Surgical History:   Procedure Laterality Date   ANTERIOR CERVICAL DECOMP/DISCECTOMY FUSION N/A 01/13/2021   Procedure: C3-5 ANTERIOR CERVICAL DECOMPRESSION/DISCECTOMY FUSION;  Surgeon: Venetia Night, MD;  Location: [facility] ORS;  Service: Neurosurgery;  Laterality: N/A;   CERVICAL SPINE SURGERY     4   CLAVICLE SURGERY     COLONOSCOPY WITH PROPOFOL N/A 06/29/2022   Procedure: COLONOSCOPY WITH PROPOFOL;  Surgeon: Wyline Mood, MD;  Location: [facility] ENDOSCOPY;  Service: Gastroenterology;  Laterality: N/A;  USES MED TRANS; WILL NEED TIME ON FRIDAY, 06/24/2022   HARDWARE REMOVAL Left 04/20/2015   Procedure: HARDWARE REMOVAL left leg;  Surgeon: Deeann Saint, MD;  Location: [facility] ORS;  Service: Orthopedics;  Laterality: Left;   KNEE SURGERY Left    NECK SURGERY     x6   SHOULDER SURGERY Left    TOTAL KNEE ARTHROPLASTY Left 04/12/2021   Procedure: TOTAL KNEE ARTHROPLASTY;  Surgeon: Lyndle Herrlich, MD;  Location: [facility] ORS;  Service: Orthopedics;  Laterality: Left;   ULNAR NERVE TRANSPOSITION Right 11/17/2021   Procedure: SUBCUTANEOUS TRANSPOSITION OF ULNAR NERVE, RIGHT ELBOW;  Surgeon: Christena Flake, MD;  Location: [facility]  ORS;  Service: Orthopedics;  Laterality: Right;   ULNAR NERVE TRANSPOSITION Right 10/04/2022   Procedure: ULNAR NERVE TRANSPOSITION;  Surgeon: Lovenia Kim, MD;  Location: [facility] ORS;  Service: Neurosurgery;  Laterality: Right;  NO BLOCK   Patient Active Problem List   Diagnosis Date Noted   Ulnar neuropathy of right upper extremity 10/05/2022   Hand weakness 10/05/2022   Nerve pain 10/05/2022   Adenomatous polyp of colon 06/29/2022   S/P TKR (total knee replacement) using cement, left 04/12/2021   Cervical myelopathy (HCC) 01/13/2021   Seizure (HCC) 11/26/2020   Abnormal thyroid blood test 04/06/2016   Encounter for screening colonoscopy 01/05/2016   Abnormal weight gain 01/05/2016   Chronic prescription benzodiazepine use 10/19/2015   Elevated liver enzymes 08/20/2015   Medication monitoring  encounter 08/20/2015   Hypokalemia 08/20/2015   Erectile dysfunction 04/22/2015   Status post hardware removal 04/20/2015   Bipolar 2 disorder (HCC) 07/29/2014   Marijuana abuse 07/29/2014   Chronic knee pain 07/29/2014   Bipolar 1 disorder, mixed (HCC)    Drug abuse, opioid type (HCC) 06/28/2013   GERD without esophagitis 10/03/2012   Hypertension goal BP (blood pressure) < 140/90 10/03/2012   Chronic pain associated with significant psychosocial dysfunction 07/17/2012   Lumbar canal stenosis 04/09/2012   Hypercholesterolemia 01/06/2012   Constipation 11/11/2011   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/2010   Decreased motor strength 09/30/2010   Current tobacco use 09/30/2010   H/O transient cerebral ischemia 09/09/2010   Low back pain 08/30/2010   Cervical spinal cord compression (HCC) 07/22/2010   Cervical post-laminectomy syndrome 07/22/2010   Chronic obstructive pulmonary disease (HCC) 02/23/2010   ONSET DATE: 10/04/22 (most recent sx)  REFERRING DIAG: G56.21 (ICD-10-CM) - Ulnar neuropathy of right upper extremity   THERAPY DIAG:  Muscle weakness (generalized)  Other lack of coordination  Ulnar neuropathy at elbow of right upper extremity  Stiffness of right hand, not elsewhere classified  Stiffness of right wrist, not elsewhere classified  Rationale for Evaluation and Treatment: Rehabilitation  SUBJECTIVE:  SUBJECTIVE STATEMENT: Pt reports he was able to use his clippers/trimmers this weekend in his R hand to trim the hair on his face, ears, and neck for the first time since his surgery. Pt accompanied by: self  PERTINENT HISTORY:  Per note from Dr. Katrinka Blazing on 11/16/22: [name] is 2 weeks status post Ulnar nerve decompression, AIN to ulnar nerve transfer, and ulnar nerve peripheral nerve stimulator placement. Given oxycodone on discharge from the hospital.   Per medical record, pt underwent first ulnar nerve transposition on 11/17/21 and participated in OT post  surgery.  2nd ulnar nerve transposition done on 10/04/22.  Pt had nerve stimulator placed but pt reports he does not like how it feels so he does not turn it on.  Pt also with hx of 6 cervical spinal surgeries; see above for additional hx.    PRECAUTIONS: None  RED FLAGS: None   WEIGHT BEARING RESTRICTIONS: No  PAIN: 01/30/23: R wrist/hand 4/10 pain, R arm and hand 5-7/10 pain with activity Are you having pain? Yes: NPRS scale: 2/10 at rest, with activity 6-7/10 Pain location: ulnar side of R arm extending from elbow to hand Pain description: pins and needles, occasional spasms, numbness Aggravating factors: "my arm doesn't like the cold, increased activity  Relieving factors: heat, rest, Gabapentin  LIVING ENVIRONMENT: Lives with: alone with 2 dogs  PLOF: Independent, works as a Scientist, water quality  PATIENT GOALS: build up the strength in the  R arm and hand  NEXT MD VISIT: Nov 1st to Dr. Katrinka Blazing  OBJECTIVE:  Note: Objective measures were completed at Evaluation unless otherwise noted.  HAND DOMINANCE: Right  ADLs: Overall ADLs: Pt reports he always tries to use the R arm as able, but in a limited capacity, and is unable to use the R hand with good accuracy or efficiency. Transfers/ambulation related to ADLs: indep Eating: gross grasp in R hand to hold utensils, difficulty cutting food  Grooming: uses L non-dominant hand to shave  UB Dressing: difficulty with clothing fasteners LB Dressing: difficulty with clothing fasteners, extra time to tie shoe laces  Toileting: uses L non-dominant hand for toilet hygiene Bathing: Pt states that he makes himself use the R hand when bathing but control is limited  FUNCTIONAL OUTCOME MEASURES: FOTO: 50; predicted with 56 12/26/22: FOTO: 49  UPPER EXTREMITY ROM:     Active ROM Left eval Right eval Right 12/26/22  Shoulder flexion     Shoulder abduction     Shoulder adduction     Shoulder extension     Shoulder internal rotation      Shoulder external rotation     Elbow flexion     Elbow extension     Wrist flexion     Wrist extension 65 58 60  Wrist ulnar deviation WNL -30 -30  Wrist radial deviation WNL WNL WNL  Wrist pronation 90 90 90  Wrist supination 75 70 75  (Blank rows = not tested)  Active ROM Right eval Left eval  Thumb MCP (0-60)    Thumb IP (0-80)    Thumb Radial abd/add (0-55)     Thumb Palmar abd/add (0-45)     Thumb Opposition to Small Finger     Index MCP (0-90)     Index PIP (0-100)     Index DIP (0-70)      Long MCP (0-90)      Long PIP (0-100)      Long DIP (0-70)      Ring MCP (0-90)      Ring PIP (0-100)      Ring DIP (0-70)      Little MCP (0-90)      Little PIP (0-100)      Little DIP (0-70)      (Blank rows = not tested)  Eval: R hand digits limited in PIP extension in 4th and 5th digits with neutral wrist (lacking ~25% ext in 4th digit PIP and 50% ext in 5th digit PIP) 12/26/22: R hand lacking ~10% ext in 4th digit PIP and lacking ~30% ext in 5th digit PIP  Eval: -limited active digit abd in 4th and 5th digits 12/26/22: able to achieve slight digit abduction in 4th and 5th digits when compensating with wrist and MP flexion    Eval: -Unable to oppose R 4th and 5th digits to thumb  12/26/22: unable to oppose R 4th and 5th digits to thumb  UPPER EXTREMITY MMT:     MMT Left eval Right eval Right 12/26/22  Shoulder flexion     Shoulder abduction     Shoulder adduction     Shoulder extension     Shoulder internal rotation     Shoulder external rotation     Middle trapezius     Lower trapezius     Elbow flexion 5 5   Elbow extension 5 5   Wrist flexion 5 4 4+  Wrist extension 5 4+ (simultaneous radial dev) 4+ (slight radial  dev)  Wrist ulnar deviation 5 1   Wrist radial deviation 5 5   Wrist pronation 5 5   Wrist supination 5 5   (Blank rows = not tested)  HAND FUNCTION: Grip strength: Right: 26 lbs; Left: 95 lbs, Lateral pinch: Right: 7 lbs, Left: 32 lbs,  and 3 point pinch: Right: 4 lbs, Left: 27 lbs 12/12/22: R grip 35 lbs; R lateral pinch: 8 lbs: 3 point pinch: 4 lbs  12/26/22: R grip 33 lbs; R lateral pinch: 8 lbs; 3 point pinch: 4 lbs (Pt reports increased fatigue in R hand today following extensive use of the hand over the weekend when using a tool)  COORDINATION: 9 Hole Peg test: Right: 2 min 39 sec; Left: 26 sec 12/26/22: R 48 secs  SENSATION: Light touch: Impaired ; ulnar nerve distribution  EDEMA: very mild edema surrounding incision sites on R forearm and elbow   COGNITION: Overall cognitive status: Within functional limits for tasks assessed Areas of impairment:  hx of bipolar 1, schizophrenia, and drug and alcohol abuse  OBSERVATIONS:  Pt pleasant, cooperative, and eager to regain strength in his R dominant arm.  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   Note for education/handouts: Pt reports he is limited with his reading and writing ability, but states he will always ask if he doesn't understand something.  TODAY'S TREATMENT:                                                                                                                              DATE: 01/30/23 Therapeutic Exercise: -Facilitated R hand strengthening/flexibility exercises: -Completed passive stretching for R wrist and digit ext, ulnar deviation, R forearm sup --Completed passive palmar add/abd of thumb to base of 5th digit, then active assisted for same -Completed active assisted R radial deviation x2 sets 10 reps each -Performed proximal ulnar nerve glides with pt initially requiring mod A to achieve desired position with each positional change (3 step sequence).  Pt then used wall to achieve wrist and digit ext with R elbow extended and shoulder abducted to 90 for 2nd step of sequence to achieve desired position.  After several reps, pt able to complete with min vc only.  Therapeutic  Activity: Facilitated R hand FMC/dexterity skills working to perform digit opposition to thumb.  Pt worked on stabilizing a small item (poke chip) between each digit and thumb.  Pt worked on storing 3 poker chips in hand and using thumb to move items from palm out of R hand on the radial side of palm.  Not yet able to store the other 2 chips in hand without dropping during the translatory movement attempts.   Manual Therapy: Performed soft tissue massage at R medial elbow, triceps tendon, and ulnar side of forearm for increasing circulation, and reducing pain and stiffness to ulnar side of R arm.  Scar massage completed to reduce risk of  scar adhesions to R medial elbow incision.  PATIENT EDUCATION: Education details: Insurance underwriter nerve glides Person educated: Patient Education method: Explanation Education comprehension: verbalized understanding, demonstrated understanding with vc and tactile cues  HOME EXERCISE PROGRAM: Yellow theraputty, wrist/forearm strengthening with 2# dumbbell, ulnar nerve glides  GOALS: Goals reviewed with patient? Yes  SHORT TERM GOALS: Target date: 01/02/23  Pt will be indep to perform HEP for improving distal RUE flexibility, strength, and coordination. Baseline: Eval: Not yet initiated; 12/26/22: pt routinely works on resistive hand strengthening exercises with tools and putty at home, as well as stretching wrist and digits, and FMC activities.  Goal status: achieved  LONG TERM GOALS: Target date: 02/13/23  Pt will increase FOTO score to 56 or better to indicate improvement in self perceived functional use of the R arm with daily tasks. Baseline: Eval: 50; 12/26/22: 49 Goal status: ongoing  2.  Pt will increase R grip strength by 10 or more lbs to ease ability carry heavy grocery bags in R dominant hand. Baseline: Eval: R grip 26 lbs (non-dominant L 95 lbs); 12/26/22: R grip 33 lbs Goal status: ongoing  3.  Pt will increase R lateral pinch strength by 5 or  more lbs to ease ability to open drink bottles. Baseline: Eval: R 7 lbs (non-dominant L 32 lbs); 12/26/22: R 8 lbs Goal status: ongoing  4.  Pt will tolerate manual therapy, therapeutic modalities, and exercises to decrease pain in RUE to a reported 4/10 pain or less with activity.   Baseline: Eval: R forearm and hand 6-7/10 pain with activity; 12/26/22: Pt reports increased pain today after increased use of R hand over the weekend; still at 6-7/10 pain with activity. Goal status: ongoing  5.  Pt will increase R hand FMC/dexterity skills to improve efficiency with clothing fasteners as noted by completion of 9 hole peg test in <40 sec (revised on 12/26/22 from <2 min).  Baseline: Eval: 2 min 39 sec; extra time clothing fasteners, specifically buttons and tying shoe laces; 12/26/22: R 48 sec  Goal status: achieved/revised/ongoing  ASSESSMENT: CLINICAL IMPRESSION: Pt with improved tolerance to ulnar nerve glides this date, though still requiring manual assist to achieve max ranges for desired forearm, wrist, and digit ext positions when completing the full arm nerve glide, proximally and distally.  Pt is steadily increasing functional use of the R hand for daily tasks, and reports that he was able to use his clippers/trimmers this weekend in his R hand to trim the hair on his face, ears, and neck for the first time since his surgery.  Pt continues to struggle with in hand manipulation skills, but is improving with digit opposition and thumb palmar adduction/abduction to move items within palm.  Pt will continue to benefit from skilled OT to address RUE weakness, coordination deficits, stiffness, and pain in the RUE, in order to work towards increased functional use of the R arm for ADL/IADL/work tasks.    PERFORMANCE DEFICITS: in functional skills including ADLs, IADLs, coordination, dexterity, sensation, edema, ROM, strength, pain, fascial restrictions, muscle spasms, flexibility, Fine motor control,  body mechanics, decreased knowledge of use of DME, skin integrity, and UE functional use, and psychosocial skills including coping strategies, environmental adaptation, habits, and routines and behaviors.   IMPAIRMENTS: are limiting patient from ADLs, IADLs, rest and sleep, work, and leisure.   COMORBIDITIES: has co-morbidities such as hx of 6 cervical spinal surgeries, bipolar 1, schizophrenia  that affects occupational performance. Patient will benefit from skilled OT to address  above impairments and improve overall function.  MODIFICATION OR ASSISTANCE TO COMPLETE EVALUATION: No modification of tasks or assist necessary to complete an evaluation.  OT OCCUPATIONAL PROFILE AND HISTORY: Problem focused assessment: Including review of records relating to presenting problem.  CLINICAL DECISION MAKING: Moderate - several treatment options, min-mod task modification necessary  REHAB POTENTIAL: Good  EVALUATION COMPLEXITY: Moderate      PLAN:  OT FREQUENCY: 2x/week  OT DURATION: 12 weeks  PLANNED INTERVENTIONS: self care/ADL training, therapeutic exercise, therapeutic activity, neuromuscular re-education, manual therapy, scar mobilization, passive range of motion, splinting, electrical stimulation, paraffin, moist heat, cryotherapy, contrast bath, patient/family education, cognitive remediation/compensation, psychosocial skills training, coping strategies training, and DME and/or AE instructions  RECOMMENDED OTHER SERVICES: None at this time  CONSULTED AND AGREED WITH PLAN OF CARE: Patient  PLAN FOR NEXT SESSION: see above  Danelle Earthly, MS, OTR/L  Otis Dials, OT 01/30/2023, 2:52 PM

## 2023-02-01 ENCOUNTER — Ambulatory Visit: Payer: MEDICAID

## 2023-02-02 ENCOUNTER — Ambulatory Visit: Payer: MEDICAID

## 2023-02-02 DIAGNOSIS — R278 Other lack of coordination: Secondary | ICD-10-CM

## 2023-02-02 DIAGNOSIS — G5621 Lesion of ulnar nerve, right upper limb: Secondary | ICD-10-CM

## 2023-02-02 DIAGNOSIS — M25641 Stiffness of right hand, not elsewhere classified: Secondary | ICD-10-CM

## 2023-02-02 DIAGNOSIS — M25631 Stiffness of right wrist, not elsewhere classified: Secondary | ICD-10-CM

## 2023-02-02 DIAGNOSIS — M6281 Muscle weakness (generalized): Secondary | ICD-10-CM | POA: Diagnosis not present

## 2023-02-02 NOTE — Therapy (Signed)
OUTPATIENT OCCUPATIONAL THERAPY ORTHO PROGRESS AND TREATMENT NOTE Progress reporting period beginning 12/26/22-02/02/23   Patient Name: Scott Howell MRN: [id number] DOB:[birth year], 52 y.o., male Today's Date: 02/04/2023  PCP: Dr. Margarita Mail REFERRING PROVIDER: Dr. Ernestine Mcmurray   END OF SESSION:  OT End of Session - 02/04/23 1326     Visit Number 20    Number of Visits 24    Date for OT Re-Evaluation 02/13/23    Authorization Time Period Progress reporting period beginning 12/26/22-02/02/23    Progress Note Due on Visit 10    OT Start Time 1315    OT Stop Time 1400    OT Time Calculation (min) 45 min    Activity Tolerance Patient tolerated treatment well    Behavior During Therapy WFL for tasks assessed/performed            Past Medical History:  Diagnosis Date   Adenomatous polyp of colon    Arthritis    Asthma    Benign fibroma of prostate    Bipolar 1 disorder (HCC)    Cervical myelopathy (HCC)    Cervical post-laminectomy syndrome    Cervical spinal cord compression (HCC)    Chronic knee pain    Chronic pain associated with significant psychosocial dysfunction    Chronic prescription benzodiazepine use    Constipation    COPD (chronic obstructive pulmonary disease) (HCC)    Decreased motor strength    Drug abuse, opioid type (HCC)    Dyspnea    Elevated liver enzymes    Erectile dysfunction    Fibromyalgia    GERD (gastroesophageal reflux disease)    H/O ETOH abuse    Headache    High cholesterol    History of 2019 novel coronavirus disease (COVID-19) 11/01/2021   History of transient cerebral ischemia    Homicidal ideation    Hypertension    Hypokalemia    Living accommodation issues    a.) as of 03/29/2021 --> living in camper   Lower back pain    Lumbar canal stenosis    Marijuana use    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   Pneumonia    Presence of permanent cardiac pacemaker    S/P TKR (total knee replacement)     Schizophrenia (HCC)    Seizures (HCC) 11/2020   Status post hardware removal    Stroke (HCC)    no deficits   Tobacco use    Past Surgical History:  Procedure Laterality Date   ANTERIOR CERVICAL DECOMP/DISCECTOMY FUSION N/A 01/13/2021   Procedure: C3-5 ANTERIOR CERVICAL DECOMPRESSION/DISCECTOMY FUSION;  Surgeon: Venetia Night, MD;  Location: [facility];  Service: Neurosurgery;  Laterality: N/A;   CERVICAL SPINE SURGERY     4   CLAVICLE SURGERY     COLONOSCOPY WITH PROPOFOL N/A 06/29/2022   Procedure: COLONOSCOPY WITH PROPOFOL;  Surgeon: Wyline Mood, MD;  Location: [facility];  Service: Gastroenterology;  Laterality: N/A;  USES MED TRANS; WILL NEED TIME ON FRIDAY, 06/24/2022   HARDWARE REMOVAL Left 04/20/2015   Procedure: HARDWARE REMOVAL left leg;  Surgeon: Deeann Saint, MD;  Location: [facility];  Service: Orthopedics;  Laterality: Left;   KNEE SURGERY Left    NECK SURGERY     x6   SHOULDER SURGERY Left    TOTAL KNEE ARTHROPLASTY Left 04/12/2021   Procedure: TOTAL KNEE ARTHROPLASTY;  Surgeon: Lyndle Herrlich, MD;  Location: [facility];  Service: Orthopedics;  Laterality: Left;   ULNAR NERVE TRANSPOSITION Right 11/17/2021  Procedure: SUBCUTANEOUS TRANSPOSITION OF ULNAR NERVE, RIGHT ELBOW;  Surgeon: Christena Flake, MD;  Location: [facility];  Service: Orthopedics;  Laterality: Right;   ULNAR NERVE TRANSPOSITION Right 10/04/2022   Procedure: ULNAR NERVE TRANSPOSITION;  Surgeon: Lovenia Kim, MD;  Location: [facility];  Service: Neurosurgery;  Laterality: Right;  NO BLOCK   Patient Active Problem List   Diagnosis Date Noted   Ulnar neuropathy of right upper extremity 10/05/2022   Hand weakness 10/05/2022   Nerve pain 10/05/2022   Adenomatous polyp of colon 06/29/2022   S/P TKR (total knee replacement) using cement, left 04/12/2021   Cervical myelopathy (HCC) 01/13/2021   Seizure (HCC) 11/26/2020   Abnormal thyroid blood test 04/06/2016   Encounter for screening colonoscopy  01/05/2016   Abnormal weight gain 01/05/2016   Chronic prescription benzodiazepine use 10/19/2015   Elevated liver enzymes 08/20/2015   Medication monitoring encounter 08/20/2015   Hypokalemia 08/20/2015   Erectile dysfunction 04/22/2015   Status post hardware removal 04/20/2015   Bipolar 2 disorder (HCC) 07/29/2014   Marijuana abuse 07/29/2014   Chronic knee pain 07/29/2014   Bipolar 1 disorder, mixed (HCC)    Drug abuse, opioid type (HCC) 06/28/2013   GERD without esophagitis 10/03/2012   Hypertension goal BP (blood pressure) < 140/90 10/03/2012   Chronic pain associated with significant psychosocial dysfunction 07/17/2012   Lumbar canal stenosis 04/09/2012   Hypercholesterolemia 01/06/2012   Constipation 11/11/2011   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/2010   Decreased motor strength 09/30/2010   Current tobacco use 09/30/2010   H/O transient cerebral ischemia 09/09/2010   Low back pain 08/30/2010   Cervical spinal cord compression (HCC) 07/22/2010   Cervical post-laminectomy syndrome 07/22/2010   Chronic obstructive pulmonary disease (HCC) 02/23/2010   ONSET DATE: 10/04/22 (most recent sx)  REFERRING DIAG: G56.21 (ICD-10-CM) - Ulnar neuropathy of right upper extremity   THERAPY DIAG:  Muscle weakness (generalized)  Other lack of coordination  Ulnar neuropathy at elbow of right upper extremity  Stiffness of right hand, not elsewhere classified  Stiffness of right wrist, not elsewhere classified  Rationale for Evaluation and Treatment: Rehabilitation  SUBJECTIVE:  SUBJECTIVE STATEMENT: Pt reports extensively using his R hand over the last couple of days with shoveling and lifting heavy equipment. Pt accompanied by: self  PERTINENT HISTORY:  Per note from Dr. Katrinka Blazing on 11/16/22: Scott Howell is 2 weeks status post Ulnar nerve decompression, AIN to ulnar nerve transfer, and ulnar nerve peripheral nerve stimulator placement. Given oxycodone on discharge from the  hospital.   Per medical record, pt underwent first ulnar nerve transposition on 11/17/21 and participated in OT post surgery.  2nd ulnar nerve transposition done on 10/04/22.  Pt had nerve stimulator placed but pt reports he does not like how it feels so he does not turn it on.  Pt also with hx of 6 cervical spinal surgeries; see above for additional hx.    PRECAUTIONS: None  RED FLAGS: None   WEIGHT BEARING RESTRICTIONS: No  PAIN: 02/02/23: R wrist/hand 1/10 pain R arm and hand at rest, 3.5/10 pain with activity Are you having pain? Yes: NPRS scale: 2/10 at rest, with activity 6-7/10 Pain location: ulnar side of R arm extending from elbow to hand Pain description: pins and needles, occasional spasms, numbness Aggravating factors: "my arm doesn't like the cold, increased activity  Relieving factors: heat, rest, Gabapentin  LIVING ENVIRONMENT: Lives with: alone with 2 dogs  PLOF: Independent, works as a Scientist, water quality  PATIENT GOALS:  build up the strength in the R arm and hand  NEXT MD VISIT: Nov 1st to Dr. Katrinka Blazing  OBJECTIVE:  Note: Objective measures were completed at Evaluation unless otherwise noted.  HAND DOMINANCE: Right  ADLs: Overall ADLs: Pt reports he always tries to use the R arm as able, but in a limited capacity, and is unable to use the R hand with good accuracy or efficiency. Transfers/ambulation related to ADLs: indep Eating: gross grasp in R hand to hold utensils, difficulty cutting food  Grooming: uses L non-dominant hand to shave  UB Dressing: difficulty with clothing fasteners LB Dressing: difficulty with clothing fasteners, extra time to tie shoe laces  Toileting: uses L non-dominant hand for toilet hygiene Bathing: Pt states that he makes himself use the R hand when bathing but control is limited  FUNCTIONAL OUTCOME MEASURES: FOTO: 50; predicted with 56 12/26/22: FOTO: 49 02/02/23: FOTO 50  UPPER EXTREMITY ROM:     Active ROM Left eval Right eval  Right 12/26/22 Right 02/02/23  Shoulder flexion      Shoulder abduction      Shoulder adduction      Shoulder extension      Shoulder internal rotation      Shoulder external rotation      Elbow flexion      Elbow extension      Wrist flexion      Wrist extension 65 58 60 60  Wrist ulnar deviation WNL -30 -30 -30 ( able to achieve neutral wrist with gravity assisted)  Wrist radial deviation WNL WNL WNL WNL  Wrist pronation 90 90 90 90  Wrist supination 75 70 75 80  (Blank rows = not tested)  Active ROM Right eval Left eval  Thumb MCP (0-60)    Thumb IP (0-80)    Thumb Radial abd/add (0-55)     Thumb Palmar abd/add (0-45)     Thumb Opposition to Small Finger     Index MCP (0-90)     Index PIP (0-100)     Index DIP (0-70)      Long MCP (0-90)      Long PIP (0-100)      Long DIP (0-70)      Ring MCP (0-90)      Ring PIP (0-100)      Ring DIP (0-70)      Little MCP (0-90)      Little PIP (0-100)      Little DIP (0-70)      (Blank rows = not tested)  Eval: R hand digits limited in PIP extension in 4th and 5th digits with neutral wrist (lacking ~25% ext in 4th digit PIP and 50% ext in 5th digit PIP) 12/26/22: R hand lacking ~10% ext in 4th digit PIP and lacking ~30% ext in 5th digit PIP 02/02/23: R hand lacking ~10% ext in 4th digit PIP and lacking ~30% ext in 5th digit PIP  Eval: -limited active digit abd in 4th and 5th digits 12/26/22: able to achieve slight digit abduction in 4th and 5th digits when compensating with wrist and MP flexion  02/02/23: Same as 12/26/22; fingers can abd on table top but by radially deviating    Eval: -Unable to oppose R 4th and 5th digits to thumb  12/26/22: unable to oppose R 4th and 5th digits to thumb 02/02/23: able to oppose R 4th and 5th digit to thumb, but with increased effort  UPPER EXTREMITY MMT:     MMT Left eval Right  eval Right 12/26/22  Shoulder flexion     Shoulder abduction     Shoulder adduction     Shoulder  extension     Shoulder internal rotation     Shoulder external rotation     Middle trapezius     Lower trapezius     Elbow flexion 5 5   Elbow extension 5 5   Wrist flexion 5 4 4+  Wrist extension 5 4+ (simultaneous radial dev) 4+ (slight radial dev)  Wrist ulnar deviation 5 1   Wrist radial deviation 5 5   Wrist pronation 5 5   Wrist supination 5 5   (Blank rows = not tested)  HAND FUNCTION: Grip strength: Right: 26 lbs; Left: 95 lbs, Lateral pinch: Right: 7 lbs, Left: 32 lbs, and 3 point pinch: Right: 4 lbs, Left: 27 lbs 12/12/22: R grip 35 lbs; R lateral pinch: 8 lbs: 3 point pinch: 4 lbs  12/26/22: R grip 33 lbs; R lateral pinch: 8 lbs; 3 point pinch: 4 lbs (Pt reports increased fatigue in R hand today following extensive use of the hand over the weekend when using a tool) 02/02/23: R grip 32 lbs; R lateral pinch: 4 lbs, 3 point pinch: 4 lbs (Pt reports using tools and shovels extensively over the last 2 days, possibly limited by muscle fatigue from overuse)  COORDINATION: 9 Hole Peg test: Right: 2 min 39 sec; Left: 26 sec 12/26/22: R 48 secs 02/02/23: R 51 sec   SENSATION: Light touch: Impaired ; ulnar nerve distribution  EDEMA: very mild edema surrounding incision sites on R forearm and elbow   COGNITION: Overall cognitive status: Within functional limits for tasks assessed Areas of impairment:  hx of bipolar 1, schizophrenia, and drug and alcohol abuse  OBSERVATIONS:  Pt pleasant, cooperative, and eager to regain strength in his R dominant arm.  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   Note for education/handouts: Pt reports he is limited with his reading and writing ability, but states he will always ask if he doesn't understand something.  TODAY'S TREATMENT:                                                                                                                              DATE: 02/02/23 Therapeutic  Exercise: -Facilitated R hand strengthening/flexibility exercises: -Completed passive stretching for R wrist and digit ext, ulnar deviation, R forearm sup --Completed passive palmar add/abd of thumb to base of 5th digit, then active assisted for same -Completed active assisted R radial deviation x2 sets 10 reps each -Performed proximal ulnar nerve glides with pt initially requiring mod A to achieve desired position with each positional change (3 step sequence).  Pt then used wall to achieve wrist and digit ext with R elbow extended and shoulder abducted to 90 for 2nd step of sequence to achieve desired position.  After several reps, pt able to complete with  min vc only. -Facilitated hand strengthening with use of hand gripper set at 17.9# to place/remove jumbo pegs from pegboard x4 trials using R hand.   Therapeutic Activity: Objective measures taken and goals updated and reviewed for progress note.  PATIENT EDUCATION: Education details: Insurance underwriter nerve glides Person educated: Patient Education method: Explanation Education comprehension: verbalized understanding, demonstrated understanding with vc and tactile cues  HOME EXERCISE PROGRAM: Yellow theraputty, wrist/forearm strengthening with 2# dumbbell, ulnar nerve glides  GOALS: Goals reviewed with patient? Yes  SHORT TERM GOALS: Target date: 01/02/23  Pt will be indep to perform HEP for improving distal RUE flexibility, strength, and coordination. Baseline: Eval: Not yet initiated; 12/26/22: pt routinely works on resistive hand strengthening exercises with tools and putty at home, as well as stretching wrist and digits, and FMC activities.  Goal status: achieved  LONG TERM GOALS: Target date: 02/13/23  Pt will increase FOTO score to 56 or better to indicate improvement in self perceived functional use of the R arm with daily tasks. Baseline: Eval: 50; 12/26/22: 49; 02/02/23: 50 Goal status: ongoing  2.  Pt will increase R grip  strength by 10 or more lbs to ease ability carry heavy grocery bags in R dominant hand. Baseline: Eval: R grip 26 lbs (non-dominant L 95 lbs); 12/26/22: R grip 33 lbs; 02/02/23: R grip 32 lbs Goal status: ongoing  3.  Pt will increase R lateral pinch strength by 5 or more lbs to ease ability to open drink bottles. Baseline: Eval: R 7 lbs (non-dominant L 32 lbs); 12/26/22: R 8 lbs; 02/02/23: 4 lbs  Goal status: ongoing  4.  Pt will tolerate manual therapy, therapeutic modalities, and exercises to decrease pain in RUE to a reported 4/10 pain or less with activity.   Baseline: Eval: R forearm and hand 6-7/10 pain with activity; 12/26/22: Pt reports increased pain today after increased use of R hand over the weekend; still at 6-7/10 pain with activity; 02/02/23: 1/10 pain at rest, 3.5/10 pain with activity Goal status: ongoing  5.  Pt will increase R hand FMC/dexterity skills to improve efficiency with clothing fasteners as noted by completion of 9 hole peg test in <40 sec (revised on 12/26/22 from <2 min).  Baseline: Eval: 2 min 39 sec; extra time clothing fasteners, specifically buttons and tying shoe laces; 12/26/22: R 48 sec; 02/02/23: 51 sec  Goal status: ongoing  ASSESSMENT:   CLINICAL IMPRESSION:   Pt seen for 20th visit progress update.  While FOTO score shows no significant change for these assessment specific tasks, pt endorses functional gains in all aspects of his daily tasks, reporting that he now engages the R dominant hand at least 50% of the time with daily tasks, as compared to <10% at eval.  Pt reports that he can now use his R hand to manipulate clippers to trim his facial hair (with effort), use the R hand to partially operate the tv remote control, use the R hand to self feed with a large handled spoon, and is now using the R hand to scrub dishes.  Grip and pinch strength and 9 hole peg test scores with no improvement this session, though pt reports using tools and shovels  extensively over the last 2 days, and OT questions whether these measures were effected by muscle fatigue from overuse.  Pt with improved tolerance to ulnar nerve glides this date, though still requiring manual assist to achieve max ranges for desired forearm, wrist, and digit ext positions when completing the  full arm nerve glide, proximally and distally.  Pt verbalizes improved use of his arm following ulnar nerve stretches.  Pain with activity has also reduced significantly over the last 10 visits, see above.  Pt continues to struggle with in hand manipulation skills, but is improving with digit opposition and thumb palmar adduction/abduction to move items within palm.  Pt will continue to benefit from skilled OT to address RUE weakness, coordination deficits, stiffness, and pain in the RUE, in order to work towards increased functional use of the R arm for ADL/IADL/work tasks.    PERFORMANCE DEFICITS: in functional skills including ADLs, IADLs, coordination, dexterity, sensation, edema, ROM, strength, pain, fascial restrictions, muscle spasms, flexibility, Fine motor control, body mechanics, decreased knowledge of use of DME, skin integrity, and UE functional use, and psychosocial skills including coping strategies, environmental adaptation, habits, and routines and behaviors.   IMPAIRMENTS: are limiting patient from ADLs, IADLs, rest and sleep, work, and leisure.   COMORBIDITIES: has co-morbidities such as hx of 6 cervical spinal surgeries, bipolar 1, schizophrenia  that affects occupational performance. Patient will benefit from skilled OT to address above impairments and improve overall function.  MODIFICATION OR ASSISTANCE TO COMPLETE EVALUATION: No modification of tasks or assist necessary to complete an evaluation.  OT OCCUPATIONAL PROFILE AND HISTORY: Problem focused assessment: Including review of records relating to presenting problem.  CLINICAL DECISION MAKING: Moderate - several treatment  options, min-mod task modification necessary  REHAB POTENTIAL: Good  EVALUATION COMPLEXITY: Moderate      PLAN:  OT FREQUENCY: 2x/week  OT DURATION: 12 weeks  PLANNED INTERVENTIONS: self care/ADL training, therapeutic exercise, therapeutic activity, neuromuscular re-education, manual therapy, scar mobilization, passive range of motion, splinting, electrical stimulation, paraffin, moist heat, cryotherapy, contrast bath, patient/family education, cognitive remediation/compensation, psychosocial skills training, coping strategies training, and DME and/or AE instructions  RECOMMENDED OTHER SERVICES: None at this time  CONSULTED AND AGREED WITH PLAN OF CARE: Patient  PLAN FOR NEXT SESSION: see above  Danelle Earthly, MS, OTR/L  Otis Dials, OT 02/04/2023, 1:28 PM

## 2023-02-06 ENCOUNTER — Ambulatory Visit: Payer: MEDICAID

## 2023-02-06 DIAGNOSIS — M6281 Muscle weakness (generalized): Secondary | ICD-10-CM

## 2023-02-06 DIAGNOSIS — M25631 Stiffness of right wrist, not elsewhere classified: Secondary | ICD-10-CM

## 2023-02-06 DIAGNOSIS — R278 Other lack of coordination: Secondary | ICD-10-CM

## 2023-02-06 DIAGNOSIS — G5621 Lesion of ulnar nerve, right upper limb: Secondary | ICD-10-CM

## 2023-02-06 DIAGNOSIS — M25641 Stiffness of right hand, not elsewhere classified: Secondary | ICD-10-CM

## 2023-02-06 NOTE — Therapy (Signed)
OUTPATIENT OCCUPATIONAL THERAPY ORTHO TREATMENT NOTE   Patient Name: Scott Howell MRN: [id number] DOB:[birth year], 52 y.o., male Today's Date: 02/06/2023  PCP: Dr. Margarita Mail REFERRING PROVIDER: Dr. Ernestine Mcmurray   END OF SESSION:  OT End of Session - 02/06/23 1349     Visit Number 21    Number of Visits 24    Date for OT Re-Evaluation 02/13/23    Authorization Time Period Progress reporting period beginning 02/02/23    Progress Note Due on Visit 10    OT Start Time 1345    OT Stop Time 1430    OT Time Calculation (min) 45 min    Activity Tolerance Patient tolerated treatment well    Behavior During Therapy WFL for tasks assessed/performed            Past Medical History:  Diagnosis Date   Adenomatous polyp of colon    Arthritis    Asthma    Benign fibroma of prostate    Bipolar 1 disorder (HCC)    Cervical myelopathy (HCC)    Cervical post-laminectomy syndrome    Cervical spinal cord compression (HCC)    Chronic knee pain    Chronic pain associated with significant psychosocial dysfunction    Chronic prescription benzodiazepine use    Constipation    COPD (chronic obstructive pulmonary disease) (HCC)    Decreased motor strength    Drug abuse, opioid type (HCC)    Dyspnea    Elevated liver enzymes    Erectile dysfunction    Fibromyalgia    GERD (gastroesophageal reflux disease)    H/O ETOH abuse    Headache    High cholesterol    History of 2019 novel coronavirus disease (COVID-19) 11/01/2021   History of transient cerebral ischemia    Homicidal ideation    Hypertension    Hypokalemia    Living accommodation issues    a.) as of 03/29/2021 --> living in camper   Lower back pain    Lumbar canal stenosis    Marijuana use    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   Pneumonia    Presence of permanent cardiac pacemaker    S/P TKR (total knee replacement)    Schizophrenia (HCC)    Seizures (HCC) 11/2020   Status post hardware removal     Stroke (HCC)    no deficits   Tobacco use    Past Surgical History:  Procedure Laterality Date   ANTERIOR CERVICAL DECOMP/DISCECTOMY FUSION N/A 01/13/2021   Procedure: C3-5 ANTERIOR CERVICAL DECOMPRESSION/DISCECTOMY FUSION;  Surgeon: Venetia Night, MD;  Location: [facility];  Service: Neurosurgery;  Laterality: N/A;   CERVICAL SPINE SURGERY     4   CLAVICLE SURGERY     COLONOSCOPY WITH PROPOFOL N/A 06/29/2022   Procedure: COLONOSCOPY WITH PROPOFOL;  Surgeon: Wyline Mood, MD;  Location: [facility];  Service: Gastroenterology;  Laterality: N/A;  USES MED TRANS; WILL NEED TIME ON FRIDAY, 06/24/2022   HARDWARE REMOVAL Left 04/20/2015   Procedure: HARDWARE REMOVAL left leg;  Surgeon: Deeann Saint, MD;  Location: [facility];  Service: Orthopedics;  Laterality: Left;   KNEE SURGERY Left    NECK SURGERY     x6   SHOULDER SURGERY Left    TOTAL KNEE ARTHROPLASTY Left 04/12/2021   Procedure: TOTAL KNEE ARTHROPLASTY;  Surgeon: Lyndle Herrlich, MD;  Location: [facility];  Service: Orthopedics;  Laterality: Left;   ULNAR NERVE TRANSPOSITION Right 11/17/2021   Procedure: SUBCUTANEOUS TRANSPOSITION OF ULNAR  NERVE, RIGHT ELBOW;  Surgeon: Christena Flake, MD;  Location: [facility];  Service: Orthopedics;  Laterality: Right;   ULNAR NERVE TRANSPOSITION Right 10/04/2022   Procedure: ULNAR NERVE TRANSPOSITION;  Surgeon: Lovenia Kim, MD;  Location: [facility];  Service: Neurosurgery;  Laterality: Right;  NO BLOCK   Patient Active Problem List   Diagnosis Date Noted   Ulnar neuropathy of right upper extremity 10/05/2022   Hand weakness 10/05/2022   Nerve pain 10/05/2022   Adenomatous polyp of colon 06/29/2022   S/P TKR (total knee replacement) using cement, left 04/12/2021   Cervical myelopathy (HCC) 01/13/2021   Seizure (HCC) 11/26/2020   Abnormal thyroid blood test 04/06/2016   Encounter for screening colonoscopy 01/05/2016   Abnormal weight gain 01/05/2016   Chronic prescription benzodiazepine use  10/19/2015   Elevated liver enzymes 08/20/2015   Medication monitoring encounter 08/20/2015   Hypokalemia 08/20/2015   Erectile dysfunction 04/22/2015   Status post hardware removal 04/20/2015   Bipolar 2 disorder (HCC) 07/29/2014   Marijuana abuse 07/29/2014   Chronic knee pain 07/29/2014   Bipolar 1 disorder, mixed (HCC)    Drug abuse, opioid type (HCC) 06/28/2013   GERD without esophagitis 10/03/2012   Hypertension goal BP (blood pressure) < 140/90 10/03/2012   Chronic pain associated with significant psychosocial dysfunction 07/17/2012   Lumbar canal stenosis 04/09/2012   Hypercholesterolemia 01/06/2012   Constipation 11/11/2011   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/2010   Decreased motor strength 09/30/2010   Current tobacco use 09/30/2010   H/O transient cerebral ischemia 09/09/2010   Low back pain 08/30/2010   Cervical spinal cord compression (HCC) 07/22/2010   Cervical post-laminectomy syndrome 07/22/2010   Chronic obstructive pulmonary disease (HCC) 02/23/2010   ONSET DATE: 10/04/22 (most recent sx)  REFERRING DIAG: G56.21 (ICD-10-CM) - Ulnar neuropathy of right upper extremity   THERAPY DIAG:  Muscle weakness (generalized)  Other lack of coordination  Ulnar neuropathy at elbow of right upper extremity  Stiffness of right hand, not elsewhere classified  Stiffness of right wrist, not elsewhere classified  Rationale for Evaluation and Treatment: Rehabilitation  SUBJECTIVE:  SUBJECTIVE STATEMENT: Pt reported feeling like his hand has gotten weaker since the cold weather has come.  Pt accompanied by: self  PERTINENT HISTORY:  Per note from Dr. Katrinka Blazing on 11/16/22: Scott Howell is 2 weeks status post Ulnar nerve decompression, AIN to ulnar nerve transfer, and ulnar nerve peripheral nerve stimulator placement. Given oxycodone on discharge from the hospital.   Per medical record, pt underwent first ulnar nerve transposition on 11/17/21 and participated in OT post  surgery.  2nd ulnar nerve transposition done on 10/04/22.  Pt had nerve stimulator placed but pt reports he does not like how it feels so he does not turn it on.  Pt also with hx of 6 cervical spinal surgeries; see above for additional hx.    PRECAUTIONS: None  RED FLAGS: None   WEIGHT BEARING RESTRICTIONS: No  PAIN: 02/06/23: R wrist/hand 5-6/10 pain R arm and hand, improves with heat  Are you having pain? Yes: NPRS scale: 2/10 at rest, with activity 6-7/10 Pain location: ulnar side of R arm extending from elbow to hand Pain description: pins and needles, occasional spasms, numbness Aggravating factors: "my arm doesn't like the cold, increased activity  Relieving factors: heat, rest, Gabapentin  LIVING ENVIRONMENT: Lives with: alone with 2 dogs  PLOF: Independent, works as a Scientist, water quality  PATIENT GOALS: build up the strength in the R arm and hand  NEXT MD VISIT: Nov 1st to Dr. Katrinka Blazing  OBJECTIVE:  Note: Objective measures were completed at Evaluation unless otherwise noted.  HAND DOMINANCE: Right  ADLs: Overall ADLs: Pt reports he always tries to use the R arm as able, but in a limited capacity, and is unable to use the R hand with good accuracy or efficiency. Transfers/ambulation related to ADLs: indep Eating: gross grasp in R hand to hold utensils, difficulty cutting food  Grooming: uses L non-dominant hand to shave  UB Dressing: difficulty with clothing fasteners LB Dressing: difficulty with clothing fasteners, extra time to tie shoe laces  Toileting: uses L non-dominant hand for toilet hygiene Bathing: Pt states that he makes himself use the R hand when bathing but control is limited  FUNCTIONAL OUTCOME MEASURES: FOTO: 50; predicted with 56 12/26/22: FOTO: 49 02/02/23: FOTO 50  UPPER EXTREMITY ROM:     Active ROM Left eval Right eval Right 12/26/22 Right 02/02/23  Shoulder flexion      Shoulder abduction      Shoulder adduction      Shoulder extension       Shoulder internal rotation      Shoulder external rotation      Elbow flexion      Elbow extension      Wrist flexion      Wrist extension 65 58 60 60  Wrist ulnar deviation WNL -30 -30 -30 ( able to achieve neutral wrist with gravity assisted)  Wrist radial deviation WNL WNL WNL WNL  Wrist pronation 90 90 90 90  Wrist supination 75 70 75 80  (Blank rows = not tested)  Active ROM Right eval Left eval  Thumb MCP (0-60)    Thumb IP (0-80)    Thumb Radial abd/add (0-55)     Thumb Palmar abd/add (0-45)     Thumb Opposition to Small Finger     Index MCP (0-90)     Index PIP (0-100)     Index DIP (0-70)      Long MCP (0-90)      Long PIP (0-100)      Long DIP (0-70)      Ring MCP (0-90)      Ring PIP (0-100)      Ring DIP (0-70)      Little MCP (0-90)      Little PIP (0-100)      Little DIP (0-70)      (Blank rows = not tested)  Eval: R hand digits limited in PIP extension in 4th and 5th digits with neutral wrist (lacking ~25% ext in 4th digit PIP and 50% ext in 5th digit PIP) 12/26/22: R hand lacking ~10% ext in 4th digit PIP and lacking ~30% ext in 5th digit PIP 02/02/23: R hand lacking ~10% ext in 4th digit PIP and lacking ~30% ext in 5th digit PIP  Eval: -limited active digit abd in 4th and 5th digits 12/26/22: able to achieve slight digit abduction in 4th and 5th digits when compensating with wrist and MP flexion  02/02/23: Same as 12/26/22; fingers can abd on table top but by radially deviating    Eval: -Unable to oppose R 4th and 5th digits to thumb  12/26/22: unable to oppose R 4th and 5th digits to thumb 02/02/23: able to oppose R 4th and 5th digit to thumb, but with increased effort  UPPER EXTREMITY MMT:     MMT Left eval Right eval Right 12/26/22  Shoulder flexion     Shoulder  abduction     Shoulder adduction     Shoulder extension     Shoulder internal rotation     Shoulder external rotation     Middle trapezius     Lower trapezius     Elbow flexion  5 5   Elbow extension 5 5   Wrist flexion 5 4 4+  Wrist extension 5 4+ (simultaneous radial dev) 4+ (slight radial dev)  Wrist ulnar deviation 5 1   Wrist radial deviation 5 5   Wrist pronation 5 5   Wrist supination 5 5   (Blank rows = not tested)  HAND FUNCTION: Grip strength: Right: 26 lbs; Left: 95 lbs, Lateral pinch: Right: 7 lbs, Left: 32 lbs, and 3 point pinch: Right: 4 lbs, Left: 27 lbs 12/12/22: R grip 35 lbs; R lateral pinch: 8 lbs: 3 point pinch: 4 lbs  12/26/22: R grip 33 lbs; R lateral pinch: 8 lbs; 3 point pinch: 4 lbs (Pt reports increased fatigue in R hand today following extensive use of the hand over the weekend when using a tool) 02/02/23: R grip 32 lbs; R lateral pinch: 4 lbs, 3 point pinch: 4 lbs (Pt reports using tools and shovels extensively over the last 2 days, possibly limited by muscle fatigue from overuse)  COORDINATION: 9 Hole Peg test: Right: 2 min 39 sec; Left: 26 sec 12/26/22: R 48 secs 02/02/23: R 51 sec   SENSATION: Light touch: Impaired ; ulnar nerve distribution  EDEMA: very mild edema surrounding incision sites on R forearm and elbow   COGNITION: Overall cognitive status: Within functional limits for tasks assessed Areas of impairment:  hx of bipolar 1, schizophrenia, and drug and alcohol abuse  OBSERVATIONS:  Pt pleasant, cooperative, and eager to regain strength in his R dominant arm.  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   Note for education/handouts: Pt reports he is limited with his reading and writing ability, but states he will always ask if he doesn't understand something.  TODAY'S TREATMENT:                                                                                                                              DATE: 02/06/23 Therapeutic Exercise: -Facilitated R hand strengthening/flexibility exercises: -Completed passive stretching for R wrist and digit ext, ulnar deviation, R  forearm sup -Performed slow, passive proximal ulnar nerve glides x3, OT assisted to achieve max end range stretch with arm abducted to 90 degrees, and elbow, wrist, and digits extended, both with forearm in the supinated and pronated positions.  -Facilitated hand strengthening with use of hand gripper set at 17.9# to place/remove jumbo pegs from pegboard x4 trials using R hand.   Neuro re-ed: Facilitated R hand FMC/dexterity skills working to place grooved pegs into pegboard x2 trials.  VC to keep R elbow and forearm on table top to isolate fingers to reposition pegs within fingertips with  minimal compensatory movement patterns.  Pt practiced removing pegs using a 2 and 3 point pinch pattern.  Practiced active full composite fisting in pronation and digit extension in supination to practice grasp/release and item storage, OT adding 1 peg into palm with each rep, storing up to 17 pegs in hand x2 trials.   PATIENT EDUCATION: Education details: R hand FMC/dexterity  Person educated: Patient Education method: Explanation Education comprehension: verbalized understanding, demonstrated understanding with vc and tactile cues  HOME EXERCISE PROGRAM: Yellow theraputty, wrist/forearm strengthening with 2# dumbbell, ulnar nerve glides  GOALS: Goals reviewed with patient? Yes  SHORT TERM GOALS: Target date: 01/02/23  Pt will be indep to perform HEP for improving distal RUE flexibility, strength, and coordination. Baseline: Eval: Not yet initiated; 12/26/22: pt routinely works on resistive hand strengthening exercises with tools and putty at home, as well as stretching wrist and digits, and FMC activities.  Goal status: achieved  LONG TERM GOALS: Target date: 02/13/23  Pt will increase FOTO score to 56 or better to indicate improvement in self perceived functional use of the R arm with daily tasks. Baseline: Eval: 50; 12/26/22: 49; 02/02/23: 50 Goal status: ongoing  2.  Pt will increase R grip  strength by 10 or more lbs to ease ability carry heavy grocery bags in R dominant hand. Baseline: Eval: R grip 26 lbs (non-dominant L 95 lbs); 12/26/22: R grip 33 lbs; 02/02/23: R grip 32 lbs Goal status: ongoing  3.  Pt will increase R lateral pinch strength by 5 or more lbs to ease ability to open drink bottles. Baseline: Eval: R 7 lbs (non-dominant L 32 lbs); 12/26/22: R 8 lbs; 02/02/23: 4 lbs  Goal status: ongoing  4.  Pt will tolerate manual therapy, therapeutic modalities, and exercises to decrease pain in RUE to a reported 4/10 pain or less with activity.   Baseline: Eval: R forearm and hand 6-7/10 pain with activity; 12/26/22: Pt reports increased pain today after increased use of R hand over the weekend; still at 6-7/10 pain with activity; 02/02/23: 1/10 pain at rest, 3.5/10 pain with activity Goal status: ongoing  5.  Pt will increase R hand FMC/dexterity skills to improve efficiency with clothing fasteners as noted by completion of 9 hole peg test in <40 sec (revised on 12/26/22 from <2 min).  Baseline: Eval: 2 min 39 sec; extra time clothing fasteners, specifically buttons and tying shoe laces; 12/26/22: R 48 sec; 02/02/23: 51 sec  Goal status: ongoing  ASSESSMENT:   CLINICAL IMPRESSION:   Pt reported feeling like his hand has gotten weaker since the cold weather has come.  Pt makes sure to wear gloves and a warm coat to avoid exposing arm and hand to the cold temperatures.  Noted increased stiffness throughout the RUE this date.  Noted improved dexterity skills with grooved pegs following slow passive ulnar nerve stretching with OT assisting to achieve max end ranges in positions noted above.  Pt continues to struggle to engage the R long finger when manipulating grooved pegs and requires cues for minimizing compensatory movements in order to isolate fingers from wrist and forearm during FMC/dexterity activities.  Pt will continue to benefit from skilled OT to address RUE weakness,  coordination deficits, stiffness, and pain in the RUE, in order to work towards increased functional use of the R arm for ADL/IADL/work tasks.    PERFORMANCE DEFICITS: in functional skills including ADLs, IADLs, coordination, dexterity, sensation, edema, ROM, strength, pain, fascial restrictions, muscle spasms, flexibility, Fine motor  control, body mechanics, decreased knowledge of use of DME, skin integrity, and UE functional use, and psychosocial skills including coping strategies, environmental adaptation, habits, and routines and behaviors.   IMPAIRMENTS: are limiting patient from ADLs, IADLs, rest and sleep, work, and leisure.   COMORBIDITIES: has co-morbidities such as hx of 6 cervical spinal surgeries, bipolar 1, schizophrenia  that affects occupational performance. Patient will benefit from skilled OT to address above impairments and improve overall function.  MODIFICATION OR ASSISTANCE TO COMPLETE EVALUATION: No modification of tasks or assist necessary to complete an evaluation.  OT OCCUPATIONAL PROFILE AND HISTORY: Problem focused assessment: Including review of records relating to presenting problem.  CLINICAL DECISION MAKING: Moderate - several treatment options, min-mod task modification necessary  REHAB POTENTIAL: Good  EVALUATION COMPLEXITY: Moderate      PLAN:  OT FREQUENCY: 2x/week  OT DURATION: 12 weeks  PLANNED INTERVENTIONS: self care/ADL training, therapeutic exercise, therapeutic activity, neuromuscular re-education, manual therapy, scar mobilization, passive range of motion, splinting, electrical stimulation, paraffin, moist heat, cryotherapy, contrast bath, patient/family education, cognitive remediation/compensation, psychosocial skills training, coping strategies training, and DME and/or AE instructions  RECOMMENDED OTHER SERVICES: None at this time  CONSULTED AND AGREED WITH PLAN OF CARE: Patient  PLAN FOR NEXT SESSION: see above  Danelle Earthly, MS,  OTR/L  Otis Dials, OT 02/06/2023, 2:37 PM

## 2023-02-10 ENCOUNTER — Encounter: Payer: MEDICAID | Admitting: Physical Therapy
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
## 2023-02-14 ENCOUNTER — Encounter: Payer: MEDICAID | Admitting: Physical Therapy

## 2023-02-14 ENCOUNTER — Ambulatory Visit: Payer: MEDICAID

## 2023-02-14 DIAGNOSIS — M6281 Muscle weakness (generalized): Secondary | ICD-10-CM

## 2023-02-14 DIAGNOSIS — M25631 Stiffness of right wrist, not elsewhere classified: Secondary | ICD-10-CM

## 2023-02-14 DIAGNOSIS — R262 Difficulty in walking, not elsewhere classified: Secondary | ICD-10-CM

## 2023-02-14 DIAGNOSIS — R278 Other lack of coordination: Secondary | ICD-10-CM

## 2023-02-14 DIAGNOSIS — M25641 Stiffness of right hand, not elsewhere classified: Secondary | ICD-10-CM

## 2023-02-14 DIAGNOSIS — G5621 Lesion of ulnar nerve, right upper limb: Secondary | ICD-10-CM

## 2023-02-14 DIAGNOSIS — M542 Cervicalgia: Secondary | ICD-10-CM

## 2023-02-14 NOTE — Therapy (Addendum)
 OUTPATIENT OCCUPATIONAL THERAPY ORTHO TREATMENT NOTE   Patient Name: TAMARION HAYMOND MRN: [id number] DOB:May 05, 1970, 52 y.o., male Today's Date: 02/14/2023  PCP: Dr. Sharyle Fischer REFERRING PROVIDER: Dr. Penne Sharps   END OF SESSION:  OT End of Session - 02/14/23 1152     Visit Number 22    Number of Visits 24    Date for OT Re-Evaluation 05/01/23   Authorization Time Period Progress reporting period beginning 02/02/23    Progress Note Due on Visit 10    OT Start Time 1145    OT Stop Time 1230    OT Time Calculation (min) 45 min    Activity Tolerance Patient tolerated treatment well    Behavior During Therapy WFL for tasks assessed/performed            Past Medical History:  Diagnosis Date   Adenomatous polyp of colon    Arthritis    Asthma    Benign fibroma of prostate    Bipolar 1 disorder (HCC)    Cervical myelopathy (HCC)    Cervical post-laminectomy syndrome    Cervical spinal cord compression (HCC)    Chronic knee pain    Chronic pain associated with significant psychosocial dysfunction    Chronic prescription benzodiazepine use    Constipation    COPD (chronic obstructive pulmonary disease) (HCC)    Decreased motor strength    Drug abuse, opioid type (HCC)    Dyspnea    Elevated liver enzymes    Erectile dysfunction    Fibromyalgia    GERD (gastroesophageal reflux disease)    H/O ETOH abuse    Headache    High cholesterol    History of 2019 novel coronavirus disease (COVID-19) 11/01/2021   History of transient cerebral ischemia    Homicidal ideation    Hypertension    Hypokalemia    Living accommodation issues    a.) as of 03/29/2021 --> living in camper   Lower back pain    Lumbar canal stenosis    Marijuana use    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   Pneumonia    Presence of permanent cardiac pacemaker    S/P TKR (total knee replacement)    Schizophrenia (HCC)    Seizures (HCC) 11/2020   Status post hardware removal     Stroke (HCC)    no deficits   Tobacco use    Past Surgical History:  Procedure Laterality Date   ANTERIOR CERVICAL DECOMP/DISCECTOMY FUSION N/A 01/13/2021   Procedure: C3-5 ANTERIOR CERVICAL DECOMPRESSION/DISCECTOMY FUSION;  Surgeon: Clois Fret, MD;  Location: [facility];  Service: Neurosurgery;  Laterality: N/A;   CERVICAL SPINE SURGERY     4   CLAVICLE SURGERY     COLONOSCOPY WITH PROPOFOL  N/A 06/29/2022   Procedure: COLONOSCOPY WITH PROPOFOL ;  Surgeon: Therisa Bi, MD;  Location: [facility]COPY;  Service: Gastroenterology;  Laterality: N/A;  USES MED TRANS; WILL NEED TIME ON FRIDAY, 06/24/2022   HARDWARE REMOVAL Left 04/20/2015   Procedure: HARDWARE REMOVAL left leg;  Surgeon: Kayla Pinal, MD;  Location: [facility];  Service: Orthopedics;  Laterality: Left;   KNEE SURGERY Left    NECK SURGERY     x6   SHOULDER SURGERY Left    TOTAL KNEE ARTHROPLASTY Left 04/12/2021   Procedure: TOTAL KNEE ARTHROPLASTY;  Surgeon: Leora Lynwood SAUNDERS, MD;  Location: [facility];  Service: Orthopedics;  Laterality: Left;   ULNAR NERVE TRANSPOSITION Right 11/17/2021   Procedure: SUBCUTANEOUS TRANSPOSITION OF ULNAR NERVE,  RIGHT ELBOW;  Surgeon: Edie Norleen PARAS, MD;  Location: [facility];  Service: Orthopedics;  Laterality: Right;   ULNAR NERVE TRANSPOSITION Right 10/04/2022   Procedure: ULNAR NERVE TRANSPOSITION;  Surgeon: Claudene Penne ORN, MD;  Location: [facility];  Service: Neurosurgery;  Laterality: Right;  NO BLOCK   Patient Active Problem List   Diagnosis Date Noted   Ulnar neuropathy of right upper extremity 10/05/2022   Hand weakness 10/05/2022   Nerve pain 10/05/2022   Adenomatous polyp of colon 06/29/2022   S/P TKR (total knee replacement) using cement, left 04/12/2021   Cervical myelopathy (HCC) 01/13/2021   Seizure (HCC) 11/26/2020   Abnormal thyroid blood test 04/06/2016   Encounter for screening colonoscopy 01/05/2016   Abnormal weight gain 01/05/2016   Chronic prescription benzodiazepine use  10/19/2015   Elevated liver enzymes 08/20/2015   Medication monitoring encounter 08/20/2015   Hypokalemia 08/20/2015   Erectile dysfunction 04/22/2015   Status post hardware removal 04/20/2015   Bipolar 2 disorder (HCC) 07/29/2014   Marijuana abuse 07/29/2014   Chronic knee pain 07/29/2014   Bipolar 1 disorder, mixed (HCC)    Drug abuse, opioid type (HCC) 06/28/2013   GERD without esophagitis 10/03/2012   Hypertension goal BP (blood pressure) < 140/90 10/03/2012   Chronic pain associated with significant psychosocial dysfunction 07/17/2012   Lumbar canal stenosis 04/09/2012   Hypercholesterolemia 01/06/2012   Constipation 11/11/2011   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/2010   Decreased motor strength 09/30/2010   Current tobacco use 09/30/2010   H/O transient cerebral ischemia 09/09/2010   Low back pain 08/30/2010   Cervical spinal cord compression (HCC) 07/22/2010   Cervical post-laminectomy syndrome 07/22/2010   Chronic obstructive pulmonary disease (HCC) 02/23/2010   ONSET DATE: 10/04/22 (most recent sx)  REFERRING DIAG: G56.21 (ICD-10-CM) - Ulnar neuropathy of right upper extremity   THERAPY DIAG:  Muscle weakness (generalized)  Other lack of coordination  Ulnar neuropathy at elbow of right upper extremity  Stiffness of right hand, not elsewhere classified  Stiffness of right wrist, not elsewhere classified  Rationale for Evaluation and Treatment: Rehabilitation  SUBJECTIVE:  SUBJECTIVE STATEMENT: Pt reported some increased pain today d/t coming from PT where he did some stretching before OT began. Pt accompanied by: self  PERTINENT HISTORY:  Per note from Dr. Claudene on 11/16/22: [name] is 2 weeks status post Ulnar nerve decompression, AIN to ulnar nerve transfer, and ulnar nerve peripheral nerve stimulator placement. Given oxycodone  on discharge from the hospital.   Per medical record, pt underwent first ulnar nerve transposition on 11/17/21 and  participated in OT post surgery.  2nd ulnar nerve transposition done on 10/04/22.  Pt had nerve stimulator placed but pt reports he does not like how it feels so he does not turn it on.  Pt also with hx of 6 cervical spinal surgeries; see above for additional hx.    PRECAUTIONS: None  RED FLAGS: None   WEIGHT BEARING RESTRICTIONS: No  PAIN: 02/14/23: R wrist/hand 6/10 pain R arm and hand, improves with heat  Are you having pain? Yes: NPRS scale: 2/10 at rest, with activity 6-7/10 Pain location: ulnar side of R arm extending from elbow to hand Pain description: pins and needles, occasional spasms, numbness Aggravating factors: my arm doesn't like the cold, increased activity  Relieving factors: heat, rest, Gabapentin   LIVING ENVIRONMENT: Lives with: alone with 1 dog  PLOF: Independent, works as a scientist, water quality  PATIENT GOALS: build up the strength in the R arm and  hand  NEXT MD VISIT: Nov 1st to Dr. Claudene  OBJECTIVE:  Note: Objective measures were completed at Evaluation unless otherwise noted.  HAND DOMINANCE: Right  ADLs: Overall ADLs: Pt reports he always tries to use the R arm as able, but in a limited capacity, and is unable to use the R hand with good accuracy or efficiency. Transfers/ambulation related to ADLs: indep Eating: gross grasp in R hand to hold utensils, difficulty cutting food  Grooming: uses L non-dominant hand to shave  UB Dressing: difficulty with clothing fasteners LB Dressing: difficulty with clothing fasteners, extra time to tie shoe laces  Toileting: uses L non-dominant hand for toilet hygiene Bathing: Pt states that he makes himself use the R hand when bathing but control is limited  FUNCTIONAL OUTCOME MEASURES: FOTO: 50; predicted with 56 12/26/22: FOTO: 49 02/02/23: FOTO 50  UPPER EXTREMITY ROM:     Active ROM Left eval Right eval Right 12/26/22 Right 02/02/23  Shoulder flexion      Shoulder abduction      Shoulder adduction       Shoulder extension      Shoulder internal rotation      Shoulder external rotation      Elbow flexion      Elbow extension      Wrist flexion      Wrist extension 65 58 60 60  Wrist ulnar deviation WNL -30 -30 -30 ( able to achieve neutral wrist with gravity assisted)  Wrist radial deviation WNL WNL WNL WNL  Wrist pronation 90 90 90 90  Wrist supination 75 70 75 80  (Blank rows = not tested)  Active ROM Right eval Left eval  Thumb MCP (0-60)    Thumb IP (0-80)    Thumb Radial abd/add (0-55)     Thumb Palmar abd/add (0-45)     Thumb Opposition to Small Finger     Index MCP (0-90)     Index PIP (0-100)     Index DIP (0-70)      Long MCP (0-90)      Long PIP (0-100)      Long DIP (0-70)      Ring MCP (0-90)      Ring PIP (0-100)      Ring DIP (0-70)      Little MCP (0-90)      Little PIP (0-100)      Little DIP (0-70)      (Blank rows = not tested)  Eval: R hand digits limited in PIP extension in 4th and 5th digits with neutral wrist (lacking ~25% ext in 4th digit PIP and 50% ext in 5th digit PIP) 12/26/22: R hand lacking ~10% ext in 4th digit PIP and lacking ~30% ext in 5th digit PIP 02/02/23: R hand lacking ~10% ext in 4th digit PIP and lacking ~30% ext in 5th digit PIP  Eval: -limited active digit abd in 4th and 5th digits 12/26/22: able to achieve slight digit abduction in 4th and 5th digits when compensating with wrist and MP flexion  02/02/23: Same as 12/26/22; fingers can abd on table top but by radially deviating    Eval: -Unable to oppose R 4th and 5th digits to thumb  12/26/22: unable to oppose R 4th and 5th digits to thumb 02/02/23: able to oppose R 4th and 5th digit to thumb, but with increased effort  UPPER EXTREMITY MMT:     MMT Left eval Right eval Right 12/26/22  Shoulder flexion  Shoulder abduction     Shoulder adduction     Shoulder extension     Shoulder internal rotation     Shoulder external rotation     Middle trapezius     Lower  trapezius     Elbow flexion 5 5   Elbow extension 5 5   Wrist flexion 5 4 4+  Wrist extension 5 4+ (simultaneous radial dev) 4+ (slight radial dev)  Wrist ulnar deviation 5 1   Wrist radial deviation 5 5   Wrist pronation 5 5   Wrist supination 5 5   (Blank rows = not tested)  HAND FUNCTION: Grip strength: Right: 26 lbs; Left: 95 lbs, Lateral pinch: Right: 7 lbs, Left: 32 lbs, and 3 point pinch: Right: 4 lbs, Left: 27 lbs 12/12/22: R grip 35 lbs; R lateral pinch: 8 lbs: 3 point pinch: 4 lbs  12/26/22: R grip 33 lbs; R lateral pinch: 8 lbs; 3 point pinch: 4 lbs (Pt reports increased fatigue in R hand today following extensive use of the hand over the weekend when using a tool) 02/02/23: R grip 32 lbs; R lateral pinch: 4 lbs, 3 point pinch: 4 lbs (Pt reports using tools and shovels extensively over the last 2 days, possibly limited by muscle fatigue from overuse)  COORDINATION: 9 Hole Peg test: Right: 2 min 39 sec; Left: 26 sec 12/26/22: R 48 secs 02/02/23: R 51 sec   SENSATION: Light touch: Impaired ; ulnar nerve distribution  EDEMA: very mild edema surrounding incision sites on R forearm and elbow   COGNITION: Overall cognitive status: Within functional limits for tasks assessed Areas of impairment:  hx of bipolar 1, schizophrenia, and drug and alcohol abuse  OBSERVATIONS:  Pt pleasant, cooperative, and eager to regain strength in his R dominant arm.  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   Note for education/handouts: Pt reports he is limited with his reading and writing ability, but states he will always ask if he doesn't understand something.  TODAY'S TREATMENT:                                                                                                                              DATE: 02/14/23 Therapeutic Exercise: -Facilitated RUE flexibility exercises: -Completed passive stretching for R wrist and digit ext, ulnar  deviation, R forearm sup -Performed slow, passive proximal ulnar nerve glides x3, OT assisted to achieve max end range stretch with arm abducted to 90 degrees, and elbow, wrist, and digits extended, both with forearm in the supinated and pronated positions.  -Performed passive thumb palmar abd/add with digits 2-5 passively held in ext   Therapeutic Activity: -Facilitated pinch strengthening with use of therapy resistant clothespins to target lateral and 3 point pinch of R hand.  Able to pinch all colors for 1 trial for each pinch type, increased effort and attempts to manage black, most resistive pins.  Min  vc for positioning of R LF to formulate 3 point pinch. -Facilitated R forearm, wrist, and hand strengthening with participation in EZ board tools.  Pt worked with long handled tool to facilitate R wrist flex/ext, small and large base key turn, and small and large dial turn, x3 reps for each tool (up/down board=1 rep).  Rest breaks between sets and min vc for technique to reduce compensatory movement patterns.  PATIENT EDUCATION: Education details: R hand FMC/dexterity skills Person educated: Patient Education method: Explanation Education comprehension: verbalized understanding, demonstrated understanding with vc and tactile cues  HOME EXERCISE PROGRAM: Yellow theraputty, wrist/forearm strengthening with 2# dumbbell, ulnar nerve glides  GOALS: Goals reviewed with patient? Yes  SHORT TERM GOALS: Target date: 01/02/23  Pt will be indep to perform HEP for improving distal RUE flexibility, strength, and coordination. Baseline: Eval: Not yet initiated; 12/26/22: pt routinely works on resistive hand strengthening exercises with tools and putty at home, as well as stretching wrist and digits, and FMC activities.  Goal status: achieved  LONG TERM GOALS: Target date: 05/01/23  Pt will increase FOTO score to 56 or better to indicate improvement in self perceived functional use of the R arm with  daily tasks. Baseline: Eval: 50; 12/26/22: 49; 02/02/23: 50 Goal status: ongoing  2.  Pt will increase R grip strength by 10 or more lbs to ease ability carry heavy grocery bags in R dominant hand. Baseline: Eval: R grip 26 lbs (non-dominant L 95 lbs); 12/26/22: R grip 33 lbs; 02/02/23: R grip 32 lbs Goal status: ongoing  3.  Pt will increase R lateral pinch strength by 5 or more lbs to ease ability to open drink bottles. Baseline: Eval: R 7 lbs (non-dominant L 32 lbs); 12/26/22: R 8 lbs; 02/02/23: 4 lbs  Goal status: ongoing  4.  Pt will tolerate manual therapy, therapeutic modalities, and exercises to decrease pain in RUE to a reported 4/10 pain or less with activity.   Baseline: Eval: R forearm and hand 6-7/10 pain with activity; 12/26/22: Pt reports increased pain today after increased use of R hand over the weekend; still at 6-7/10 pain with activity; 02/02/23: 1/10 pain at rest, 3.5/10 pain with activity Goal status: ongoing  5.  Pt will increase R hand FMC/dexterity skills to improve efficiency with clothing fasteners as noted by completion of 9 hole peg test in <40 sec (revised on 12/26/22 from <2 min).  Baseline: Eval: 2 min 39 sec; extra time clothing fasteners, specifically buttons and tying shoe laces; 12/26/22: R 48 sec; 02/02/23: 51 sec  Goal status: ongoing  ASSESSMENT:   CLINICAL IMPRESSION:   Pt continues to report functional improvements with his R hand, and noted that he has been able to start putting his hair into a ponytail again, using the R hand to manipulate the band.  Good tolerance to RUE passive stretching this date.  Pt not yet able to achieve full ROM to complete a full arm ulnar nerve glide and does require manual assist from OT to achieve max end range stretch proximally and to perform with correct form.  R long finger is better engaged when working to use a 3 point pinch, though this pinch is still weak (blue and black pins are difficult).  Pt verbalized today  that the sensation in the R LF finger has been improving, and is now only numb at the fingertip of this digit.  Pt will continue to benefit from skilled OT to address RUE weakness, coordination deficits, stiffness, and  pain in the RUE, in order to work towards increased functional use of the R arm for ADL/IADL/work tasks.    PERFORMANCE DEFICITS: in functional skills including ADLs, IADLs, coordination, dexterity, sensation, edema, ROM, strength, pain, fascial restrictions, muscle spasms, flexibility, Fine motor control, body mechanics, decreased knowledge of use of DME, skin integrity, and UE functional use, and psychosocial skills including coping strategies, environmental adaptation, habits, and routines and behaviors.   IMPAIRMENTS: are limiting patient from ADLs, IADLs, rest and sleep, work, and leisure.   COMORBIDITIES: has co-morbidities such as hx of 6 cervical spinal surgeries, bipolar 1, schizophrenia  that affects occupational performance. Patient will benefit from skilled OT to address above impairments and improve overall function.  MODIFICATION OR ASSISTANCE TO COMPLETE EVALUATION: No modification of tasks or assist necessary to complete an evaluation.  OT OCCUPATIONAL PROFILE AND HISTORY: Problem focused assessment: Including review of records relating to presenting problem.  CLINICAL DECISION MAKING: Moderate - several treatment options, min-mod task modification necessary  REHAB POTENTIAL: Good  EVALUATION COMPLEXITY: Moderate      PLAN:  OT FREQUENCY: 2x/week  OT DURATION: 12 weeks  PLANNED INTERVENTIONS: self care/ADL training, therapeutic exercise, therapeutic activity, neuromuscular re-education, manual therapy, scar mobilization, passive range of motion, splinting, electrical stimulation, paraffin, moist heat, cryotherapy, contrast bath, patient/family education, cognitive remediation/compensation, psychosocial skills training, coping strategies training, and DME  and/or AE instructions  RECOMMENDED OTHER SERVICES: None at this time  CONSULTED AND AGREED WITH PLAN OF CARE: Patient  PLAN FOR NEXT SESSION: see above  Inocente Blazing, MS, OTR/L  Inocente MARLA Blazing, OT 02/14/2023, 12:20 PM

## 2023-02-17 ENCOUNTER — Ambulatory Visit: Payer: MEDICAID | Attending: Neurosurgery

## 2023-02-17 DIAGNOSIS — R278 Other lack of coordination: Secondary | ICD-10-CM | POA: Insufficient documentation

## 2023-02-17 DIAGNOSIS — M542 Cervicalgia: Secondary | ICD-10-CM | POA: Insufficient documentation

## 2023-02-17 DIAGNOSIS — M25641 Stiffness of right hand, not elsewhere classified: Secondary | ICD-10-CM | POA: Insufficient documentation

## 2023-02-17 DIAGNOSIS — G5621 Lesion of ulnar nerve, right upper limb: Secondary | ICD-10-CM | POA: Insufficient documentation

## 2023-02-17 DIAGNOSIS — M25631 Stiffness of right wrist, not elsewhere classified: Secondary | ICD-10-CM | POA: Diagnosis present

## 2023-02-17 DIAGNOSIS — R262 Difficulty in walking, not elsewhere classified: Secondary | ICD-10-CM | POA: Insufficient documentation

## 2023-02-17 DIAGNOSIS — M6281 Muscle weakness (generalized): Secondary | ICD-10-CM | POA: Insufficient documentation

## 2023-02-17 NOTE — Therapy (Signed)
 OUTPATIENT OCCUPATIONAL THERAPY ORTHO TREATMENT NOTE   Patient Name: Scott Howell MRN: [id number] DOB:04/18/1970, 53 y.o., male Today's Date: 02/17/2023  PCP: Dr. Sharyle Howell REFERRING PROVIDER: Dr. Penne Howell   END OF SESSION:  OT End of Session - 02/17/23 1020     Visit Number 23    Number of Visits 24    Date for OT Re-Evaluation 02/13/23    Authorization Time Period Progress reporting period beginning 02/02/23    Progress Note Due on Visit 10    OT Start Time 1015    OT Stop Time 1100    OT Time Calculation (min) 45 min    Activity Tolerance Patient tolerated treatment well    Behavior During Therapy WFL for tasks assessed/performed            Past Medical History:  Diagnosis Date   Adenomatous polyp of colon    Arthritis    Asthma    Benign fibroma of prostate    Bipolar 1 disorder (HCC)    Cervical myelopathy (HCC)    Cervical post-laminectomy syndrome    Cervical spinal cord compression (HCC)    Chronic knee pain    Chronic pain associated with significant psychosocial dysfunction    Chronic prescription benzodiazepine use    Constipation    COPD (chronic obstructive pulmonary disease) (HCC)    Decreased motor strength    Drug abuse, opioid type (HCC)    Dyspnea    Elevated liver enzymes    Erectile dysfunction    Fibromyalgia    GERD (gastroesophageal reflux disease)    H/O ETOH abuse    Headache    High cholesterol    History of 2019 novel coronavirus disease (COVID-19) 11/01/2021   History of transient cerebral ischemia    Homicidal ideation    Hypertension    Hypokalemia    Living accommodation issues    a.) as of 03/29/2021 --> living in camper   Lower back pain    Lumbar canal stenosis    Marijuana use    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   Pneumonia    Presence of permanent cardiac pacemaker    S/P TKR (total knee replacement)    Schizophrenia (HCC)    Seizures (HCC) 11/2020   Status post hardware removal     Stroke (HCC)    no deficits   Tobacco use    Past Surgical History:  Procedure Laterality Date   ANTERIOR CERVICAL DECOMP/DISCECTOMY FUSION N/A 01/13/2021   Procedure: C3-5 ANTERIOR CERVICAL DECOMPRESSION/DISCECTOMY FUSION;  Surgeon: Scott Fret, MD;  Location: [facility];  Service: Neurosurgery;  Laterality: N/A;   CERVICAL SPINE SURGERY     4   CLAVICLE SURGERY     COLONOSCOPY WITH PROPOFOL  N/A 06/29/2022   Procedure: COLONOSCOPY WITH PROPOFOL ;  Surgeon: Scott Bi, MD;  Location: [facility];  Service: Gastroenterology;  Laterality: N/A;  USES MED TRANS; WILL NEED TIME ON FRIDAY, 06/24/2022   HARDWARE REMOVAL Left 04/20/2015   Procedure: HARDWARE REMOVAL left leg;  Surgeon: Scott Pinal, MD;  Location: [facility];  Service: Orthopedics;  Laterality: Left;   KNEE SURGERY Left    NECK SURGERY     x6   SHOULDER SURGERY Left    TOTAL KNEE ARTHROPLASTY Left 04/12/2021   Procedure: TOTAL KNEE ARTHROPLASTY;  Surgeon: Scott Lynwood SAUNDERS, MD;  Location: [facility];  Service: Orthopedics;  Laterality: Left;   ULNAR NERVE TRANSPOSITION Right 11/17/2021   Procedure: SUBCUTANEOUS TRANSPOSITION OF ULNAR  NERVE, RIGHT ELBOW;  Surgeon: Scott Norleen PARAS, MD;  Location: [facility];  Service: Orthopedics;  Laterality: Right;   ULNAR NERVE TRANSPOSITION Right 10/04/2022   Procedure: ULNAR NERVE TRANSPOSITION;  Surgeon: Scott Scott ORN, MD;  Location: [facility];  Service: Neurosurgery;  Laterality: Right;  NO BLOCK   Patient Active Problem List   Diagnosis Date Noted   Ulnar neuropathy of right upper extremity 10/05/2022   Hand weakness 10/05/2022   Nerve pain 10/05/2022   Adenomatous polyp of colon 06/29/2022   S/P TKR (total knee replacement) using cement, left 04/12/2021   Cervical myelopathy (HCC) 01/13/2021   Seizure (HCC) 11/26/2020   Abnormal thyroid blood test 04/06/2016   Encounter for screening colonoscopy 01/05/2016   Abnormal weight gain 01/05/2016   Chronic prescription benzodiazepine use  10/19/2015   Elevated liver enzymes 08/20/2015   Medication monitoring encounter 08/20/2015   Hypokalemia 08/20/2015   Erectile dysfunction 04/22/2015   Status post hardware removal 04/20/2015   Bipolar 2 disorder (HCC) 07/29/2014   Marijuana abuse 07/29/2014   Chronic knee pain 07/29/2014   Bipolar 1 disorder, mixed (HCC)    Drug abuse, opioid type (HCC) 06/28/2013   GERD without esophagitis 10/03/2012   Hypertension goal BP (blood pressure) < 140/90 10/03/2012   Chronic pain associated with significant psychosocial dysfunction 07/17/2012   Lumbar canal stenosis 04/09/2012   Hypercholesterolemia 01/06/2012   Constipation 11/11/2011   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/2010   Decreased motor strength 09/30/2010   Current tobacco use 09/30/2010   H/O transient cerebral ischemia 09/09/2010   Low back pain 08/30/2010   Cervical spinal cord compression (HCC) 07/22/2010   Cervical post-laminectomy syndrome 07/22/2010   Chronic obstructive pulmonary disease (HCC) 02/23/2010   ONSET DATE: 10/04/22 (most recent sx)  REFERRING DIAG: G56.21 (ICD-10-CM) - Ulnar neuropathy of right upper extremity   THERAPY DIAG:  Other lack of coordination  Cervicalgia  Muscle weakness (generalized)  Rationale for Evaluation and Treatment: Rehabilitation  SUBJECTIVE:  SUBJECTIVE STATEMENT: Pt reported working on car repair this weekend and continuing to put his hair in a ponytail without assist.  Pt accompanied by: self  PERTINENT HISTORY:  Per note from Dr. Claudene on 11/16/22: Scott Howell is 2 weeks status post Ulnar nerve decompression, AIN to ulnar nerve transfer, and ulnar nerve peripheral nerve stimulator placement. Given oxycodone  on discharge from the hospital.   Per medical record, pt underwent first ulnar nerve transposition on 11/17/21 and participated in OT post surgery.  2nd ulnar nerve transposition done on 10/04/22.  Pt had nerve stimulator placed but pt reports he does not like how it  feels so he does not turn it on.  Pt also with hx of 6 cervical spinal surgeries; see above for additional hx.    PRECAUTIONS: None  RED FLAGS: None   WEIGHT BEARING RESTRICTIONS: No  PAIN: 02/14/23: R wrist/hand 6/10 pain R arm and hand, improves with heat  Are you having pain? Yes: NPRS scale: 2/10 at rest, with activity 6-7/10 Pain location: ulnar side of R arm extending from elbow to hand Pain description: pins and needles, occasional spasms, numbness Aggravating factors: my arm doesn't like the cold, increased activity  Relieving factors: heat, rest, Gabapentin   LIVING ENVIRONMENT: Lives with: alone with 1 dog  PLOF: Independent, works as a scientist, water quality  PATIENT GOALS: build up the strength in the R arm and hand  NEXT MD VISIT:  OBJECTIVE:  Note: Objective measures were completed at Evaluation unless otherwise noted.  HAND  DOMINANCE: Right  ADLs: Overall ADLs: Pt reports he always tries to use the R arm as able, but in a limited capacity, and is unable to use the R hand with good accuracy or efficiency. Transfers/ambulation related to ADLs: indep Eating: gross grasp in R hand to hold utensils, difficulty cutting food  Grooming: uses L non-dominant hand to shave  UB Dressing: difficulty with clothing fasteners LB Dressing: difficulty with clothing fasteners, extra time to tie shoe laces  Toileting: uses L non-dominant hand for toilet hygiene Bathing: Pt states that he makes himself use the R hand when bathing but control is limited  FUNCTIONAL OUTCOME MEASURES: FOTO: 50; predicted with 56 12/26/22: FOTO: 49 02/02/23: FOTO 50  UPPER EXTREMITY ROM:     Active ROM Left eval Right eval Right 12/26/22 Right 02/02/23  Shoulder flexion      Shoulder abduction      Shoulder adduction      Shoulder extension      Shoulder internal rotation      Shoulder external rotation      Elbow flexion      Elbow extension      Wrist flexion      Wrist extension 65 58 60 60   Wrist ulnar deviation WNL -30 -30 -30 ( able to achieve neutral wrist with gravity assisted)  Wrist radial deviation WNL WNL WNL WNL  Wrist pronation 90 90 90 90  Wrist supination 75 70 75 80  (Blank rows = not tested)  Active ROM Right eval Left eval  Thumb MCP (0-60)    Thumb IP (0-80)    Thumb Radial abd/add (0-55)     Thumb Palmar abd/add (0-45)     Thumb Opposition to Small Finger     Index MCP (0-90)     Index PIP (0-100)     Index DIP (0-70)      Long MCP (0-90)      Long PIP (0-100)      Long DIP (0-70)      Ring MCP (0-90)      Ring PIP (0-100)      Ring DIP (0-70)      Little MCP (0-90)      Little PIP (0-100)      Little DIP (0-70)      (Blank rows = not tested)  Eval: R hand digits limited in PIP extension in 4th and 5th digits with neutral wrist (lacking ~25% ext in 4th digit PIP and 50% ext in 5th digit PIP) 12/26/22: R hand lacking ~10% ext in 4th digit PIP and lacking ~30% ext in 5th digit PIP 02/02/23: R hand lacking ~10% ext in 4th digit PIP and lacking ~30% ext in 5th digit PIP  Eval: -limited active digit abd in 4th and 5th digits 12/26/22: able to achieve slight digit abduction in 4th and 5th digits when compensating with wrist and MP flexion  02/02/23: Same as 12/26/22; fingers can abd on table top but by radially deviating    Eval: -Unable to oppose R 4th and 5th digits to thumb  12/26/22: unable to oppose R 4th and 5th digits to thumb 02/02/23: able to oppose R 4th and 5th digit to thumb, but with increased effort  UPPER EXTREMITY MMT:     MMT Left eval Right eval Right 12/26/22  Shoulder flexion     Shoulder abduction     Shoulder adduction     Shoulder extension     Shoulder internal rotation  Shoulder external rotation     Middle trapezius     Lower trapezius     Elbow flexion 5 5   Elbow extension 5 5   Wrist flexion 5 4 4+  Wrist extension 5 4+ (simultaneous radial dev) 4+ (slight radial dev)  Wrist ulnar deviation 5 1    Wrist radial deviation 5 5   Wrist pronation 5 5   Wrist supination 5 5   (Blank rows = not tested)  HAND FUNCTION: Grip strength: Right: 26 lbs; Left: 95 lbs, Lateral pinch: Right: 7 lbs, Left: 32 lbs, and 3 point pinch: Right: 4 lbs, Left: 27 lbs 12/12/22: R grip 35 lbs; R lateral pinch: 8 lbs: 3 point pinch: 4 lbs  12/26/22: R grip 33 lbs; R lateral pinch: 8 lbs; 3 point pinch: 4 lbs (Pt reports increased fatigue in R hand today following extensive use of the hand over the weekend when using a tool) 02/02/23: R grip 32 lbs; R lateral pinch: 4 lbs, 3 point pinch: 4 lbs (Pt reports using tools and shovels extensively over the last 2 days, possibly limited by muscle fatigue from overuse)  COORDINATION: 9 Hole Peg test: Right: 2 min 39 sec; Left: 26 sec 12/26/22: R 48 secs 02/02/23: R 51 sec   SENSATION: Light touch: Impaired ; ulnar nerve distribution  EDEMA: very mild edema surrounding incision sites on R forearm and elbow   COGNITION: Overall cognitive status: Within functional limits for tasks assessed Areas of impairment:  hx of bipolar 1, schizophrenia, and drug and alcohol abuse  OBSERVATIONS:  Pt pleasant, cooperative, and eager to regain strength in his R dominant arm.  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   Note for education/handouts: Pt reports he is limited with his reading and writing ability, but states he will always ask if he doesn't understand something.  TODAY'S TREATMENT:                                                                                                                              DATE: 02/17/23 Therapeutic Exercise: -Facilitated RUE flexibility exercises: -Completed passive stretching for R wrist and digit ext, ulnar deviation, R forearm sup -Performed slow, passive proximal ulnar nerve glides x3, OT assisted to achieve max end range stretch with arm abducted to 90 degrees, and elbow, wrist, and  digits extended, both with forearm in the supinated and pronated positions.   Therapeutic Activity:  Pt.  Focused on improving [facility] with manipulating nuts and bolts positioned vertically. Pt. Was able to unscrew both the 1 and 1/2 nuts, cues to place R elbow on tabletop to emphasize ulnar deviation of wrist. Pt placed R hand on top, unable to complete with R hand occluded from vision.  Pt. worked on [facility] skills using the [employer] Task. Pt placed sticks onto pegboard with focus on using wrist for manipulation. Pt worked on sustaining grasp  on the resistive tweezers while grasping this sticks and removing them from the pegboard. Pt. Required verbal cues, and cues for visual demonstration for wrist position, and hand pattern when placing them into the pegboard. Pt stored up to 17 sticks in palm without dropping during supination/pronation reps however increased elbow pain noted.    PATIENT EDUCATION: Education details: R hand FMC/dexterity skills Person educated: Patient Education method: Explanation Education comprehension: verbalized understanding, demonstrated understanding with vc and tactile cues  HOME EXERCISE PROGRAM: Yellow theraputty, wrist/forearm strengthening with 2# dumbbell, ulnar nerve glides  GOALS: Goals reviewed with patient? Yes  SHORT TERM GOALS: Target date: 01/02/23  Pt will be indep to perform HEP for improving distal RUE flexibility, strength, and coordination. Baseline: Eval: Not yet initiated; 12/26/22: pt routinely works on resistive hand strengthening exercises with tools and putty at home, as well as stretching wrist and digits, and FMC activities.  Goal status: achieved  LONG TERM GOALS: Target date: 02/13/23  Pt will increase FOTO score to 56 or better to indicate improvement in self perceived functional use of the R arm with daily tasks. Baseline: Eval: 50; 12/26/22: 49; 02/02/23: 50 Goal status: ongoing  2.  Pt will increase R grip strength  by 10 or more lbs to ease ability carry heavy grocery bags in R dominant hand. Baseline: Eval: R grip 26 lbs (non-dominant L 95 lbs); 12/26/22: R grip 33 lbs; 02/02/23: R grip 32 lbs Goal status: ongoing  3.  Pt will increase R lateral pinch strength by 5 or more lbs to ease ability to open drink bottles. Baseline: Eval: R 7 lbs (non-dominant L 32 lbs); 12/26/22: R 8 lbs; 02/02/23: 4 lbs  Goal status: ongoing  4.  Pt will tolerate manual therapy, therapeutic modalities, and exercises to decrease pain in RUE to a reported 4/10 pain or less with activity.   Baseline: Eval: R forearm and hand 6-7/10 pain with activity; 12/26/22: Pt reports increased pain today after increased use of R hand over the weekend; still at 6-7/10 pain with activity; 02/02/23: 1/10 pain at rest, 3.5/10 pain with activity Goal status: ongoing  5.  Pt will increase R hand FMC/dexterity skills to improve efficiency with clothing fasteners as noted by completion of 9 hole peg test in <40 sec (revised on 12/26/22 from <2 min).  Baseline: Eval: 2 min 39 sec; extra time clothing fasteners, specifically buttons and tying shoe laces; 12/26/22: R 48 sec; 02/02/23: 51 sec  Goal status: ongoing  ASSESSMENT:   CLINICAL IMPRESSION:   Pt continues to report functional improvements with his R hand stating he worked on psychologist, occupational this weekend. Continues to require manual assist from OT during ulnar nerve glide and to achieve max end range stretch proximally and to perform with correct form. Pt manipulated 1/8 sticks and stored up to 17 sticks in palm without dropping during supination/pronation reps however increased elbow pain noted. Pt will continue to benefit from skilled OT to address RUE weakness, coordination deficits, stiffness, and pain in the RUE, in order to work towards increased functional use of the R arm for ADL/IADL/work tasks.    PERFORMANCE DEFICITS: in functional skills including ADLs, IADLs, coordination, dexterity,  sensation, edema, ROM, strength, pain, fascial restrictions, muscle spasms, flexibility, Fine motor control, body mechanics, decreased knowledge of use of DME, skin integrity, and UE functional use, and psychosocial skills including coping strategies, environmental adaptation, habits, and routines and behaviors.   IMPAIRMENTS: are limiting patient from ADLs, IADLs, rest and sleep, work,  and leisure.   COMORBIDITIES: has co-morbidities such as hx of 6 cervical spinal surgeries, bipolar 1, schizophrenia  that affects occupational performance. Patient will benefit from skilled OT to address above impairments and improve overall function.  MODIFICATION OR ASSISTANCE TO COMPLETE EVALUATION: No modification of tasks or assist necessary to complete an evaluation.  OT OCCUPATIONAL PROFILE AND HISTORY: Problem focused assessment: Including review of records relating to presenting problem.  CLINICAL DECISION MAKING: Moderate - several treatment options, min-mod task modification necessary  REHAB POTENTIAL: Good  EVALUATION COMPLEXITY: Moderate      PLAN:  OT FREQUENCY: 2x/week  OT DURATION: 12 weeks  PLANNED INTERVENTIONS: self care/ADL training, therapeutic exercise, therapeutic activity, neuromuscular re-education, manual therapy, scar mobilization, passive range of motion, splinting, electrical stimulation, paraffin, moist heat, cryotherapy, contrast bath, patient/family education, cognitive remediation/compensation, psychosocial skills training, coping strategies training, and DME and/or AE instructions  RECOMMENDED OTHER SERVICES: None at this time  CONSULTED AND AGREED WITH PLAN OF CARE: Patient  PLAN FOR NEXT SESSION: see above    Elston JINNY Slot, OT 02/17/2023, 10:29 AM

## 2023-02-20 ENCOUNTER — Ambulatory Visit: Payer: MEDICAID
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
## 2023-02-21 NOTE — Progress Notes (Deleted)
 Referring Physician:  Bernardo Fend, DO [address] [place],  [place] [address]  Primary Physician:  Bernardo Fend, DO  History of Present Illness: 12/26/2022 note from [place], [place] Scott Howell has a history of seizures, HTN, hyperlipidemia, COPD, bipolar, FM, history of ETOH abuse, and GERD.    He is s/p ulnar nerve decompression, AIN to ulnar nerve transfer, and ulnar nerve peripheral nerve stimulator placement by Dr. Claudene on 10/04/22.    His pain was improving at his last visit, but he did not like the feeling of the stimulator, so he stopped using it. Dr. Claudene sent him to OT and he was to follow up prn.    History of ACDF C3-C5 for myelopathy on 01/13/21 with Dr. Clois. History of dysfunction in right hand due to history of multiple fractures.    PMR referred him to [facility] for left C7-T1 IL ESI. This has not been done.    He feels like his right hand is improving- he is on OT for this and has appointment later today.    He is now having constant neck pain that radiates to both shoulder blades. No arm pain. Neck pain is sharp and stabbing. Worse when he moves his head. Some relief with heat and resting. He has numbness, tingling, weakness in right ring and small finger. No numbness, tingling, or weakness in his left hand.     He is taking neurontin , mobic , and zanaflex . He is afraid of needles and doesn't think he will be able to have cervical injection.    Bowel/Bladder Dysfunction: none   He smokes 1 and 1/2 ppd x 30 years.    Conservative measures:  Physical therapy: no recent Multimodal medical therapy including regular antiinflammatories: celebrex, robaxin , oxycodone   Injections: No recent epidural steroid injections   Past Surgery:  History of ACDF C3-C5 for myelopathy on 01/13/21 with Dr. Clois History of 5 previous cervical fusion surgeries  02/21/2023 Scott Howell is here today with a chief complaint of  ***    Scott Howell has ***no symptoms of cervical myelopathy.  The symptoms are causing a significant impact on the patient's life.   I have utilized the care everywhere function in epic to review the outside records available from external health systems.  Review of Systems:  A 10 point review of systems is negative, except for the pertinent positives and negatives detailed in the HPI.  Past Medical History: Past Medical History:  Diagnosis Date   Adenomatous polyp of colon    Arthritis    Asthma    Benign fibroma of prostate    Bipolar 1 disorder (HCC)    Cervical myelopathy (HCC)    Cervical post-laminectomy syndrome    Cervical spinal cord compression (HCC)    Chronic knee pain    Chronic pain associated with significant psychosocial dysfunction    Chronic prescription benzodiazepine use    Constipation    COPD (chronic obstructive pulmonary disease) (HCC)    Decreased motor strength    Drug abuse, opioid type (HCC)    Dyspnea    Elevated liver enzymes    Erectile dysfunction    Fibromyalgia    GERD (gastroesophageal reflux disease)    H/O ETOH abuse    Headache    High cholesterol    History of 2019 novel coronavirus disease (COVID-19) 11/01/2021   History of transient cerebral ischemia    Homicidal ideation    Hypertension    Hypokalemia  Living accommodation issues    a.) as of 03/29/2021 --> living in camper   Lower back pain    Lumbar canal stenosis    Marijuana use    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   Pneumonia    Presence of permanent cardiac pacemaker    S/P TKR (total knee replacement)    Schizophrenia (HCC)    Seizures (HCC) 11/2020   Status post hardware removal    Stroke (HCC)    no deficits   Tobacco use     Past Surgical History: Past Surgical History:  Procedure Laterality Date   ANTERIOR CERVICAL DECOMP/DISCECTOMY FUSION N/A 01/13/2021   Procedure: C3-5 ANTERIOR CERVICAL DECOMPRESSION/DISCECTOMY FUSION;  Surgeon:  Clois Fret, MD;  Location: [facility] ORS;  Service: Neurosurgery;  Laterality: N/A;   CERVICAL SPINE SURGERY     4   CLAVICLE SURGERY     COLONOSCOPY WITH PROPOFOL  N/A 06/29/2022   Procedure: COLONOSCOPY WITH PROPOFOL ;  Surgeon: Therisa Bi, MD;  Location: [facility];  Service: Gastroenterology;  Laterality: N/A;  USES MED TRANS; WILL NEED TIME ON FRIDAY, 06/24/2022   HARDWARE REMOVAL Left 04/20/2015   Procedure: HARDWARE REMOVAL left leg;  Surgeon: Kayla Pinal, MD;  Location: [facility] ORS;  Service: Orthopedics;  Laterality: Left;   KNEE SURGERY Left    NECK SURGERY     x6   SHOULDER SURGERY Left    TOTAL KNEE ARTHROPLASTY Left 04/12/2021   Procedure: TOTAL KNEE ARTHROPLASTY;  Surgeon: Leora Lynwood SAUNDERS, MD;  Location: [facility] OR[facility];  Service: Orthopedics;  Laterality: Left;   ULNAR NERVE TRANSPOSITION Right 11/17/2021   Procedure: SUBCUTANEOUS TRANSPOSITION OF ULNAR NERVE, RIGHT ELBOW;  Surgeon: Edie Norleen PARAS, MD;  Location: [facility] OR[facility];  Service: Orthopedics;  Laterality: Right;   ULNAR NERVE TRANSPOSITION Right 10/04/2022   Procedure: ULNAR NERVE TRANSPOSITION;  Surgeon: Claudene Penne ORN, MD;  Location: [facility];  Service: Neurosurgery;  Laterality: Right;  NO BLOCK    Allergies: Allergies as of 02/23/2023 - Review Complete 02/17/2023  Allergen Reaction Noted   Acetaminophen  Other (See Comments) 07/06/2014    Medications:  Current Outpatient Medications:    albuterol  (VENTOLIN  HFA) 108 (90 Base) MCG/ACT inhaler, INHALE 2 PUFFS INTO LUNGS EVERY 6 HOURS AS NEEDED FOR WHEEZING, Disp: 18 each, Rfl: 2   atorvastatin  (LIPITOR) 40 MG tablet, TAKE 1 TABLET BY MOUTH EVERY DAY, Disp: 90 tablet, Rfl: 1   budesonide -formoterol  (SYMBICORT ) 160-4.5 MCG/ACT inhaler, Inhale 2 puffs into the lungs 2 (two) times daily., Disp: 1 each, Rfl: 3   divalproex  (DEPAKOTE ) 250 MG DR tablet, Take 1 tablet (250 mg total) by mouth 3 (three) times daily., Disp: 90 tablet, Rfl: 1   famotidine  (PEPCID ) 20 MG tablet, Take  1 tablet (20 mg total) by mouth 2 (two) times daily., Disp: 180 tablet, Rfl: 1   gabapentin  (NEURONTIN ) 300 MG capsule, Take 1 capsule (300 mg total) by mouth 3 (three) times daily., Disp: 270 capsule, Rfl: 3   ipratropium-albuterol  (DUONEB) 0.5-2.5 (3) MG/3ML SOLN, Take 3 mLs by nebulization 2 (two) times daily., Disp: 360 mL, Rfl: 1   lisinopril  (ZESTRIL ) 10 MG tablet, Take 1 tablet (10 mg total) by mouth daily., Disp: 90 tablet, Rfl: 1   meloxicam  (MOBIC ) 15 MG tablet, Take 15 mg by mouth at bedtime., Disp: , Rfl:    QUEtiapine  (SEROQUEL ) 200 MG tablet, Take 500 mg by mouth at bedtime., Disp: , Rfl:    tiZANidine  (ZANAFLEX ) 4 MG capsule, Take 2 mg by  mouth 3 (three) times daily., Disp: , Rfl:   Social History: Social History   Tobacco Use   Smoking status: Every Day    Current packs/day: 1.50    Average packs/day: 1.5 packs/day for 30.0 years (45.0 ttl pk-yrs)    Types: Cigarettes   Smokeless tobacco: Never  Vaping Use   Vaping status: Never Used  Substance Use Topics   Alcohol use: No    Comment: Sober for 10 yrs.   Drug use: Yes    Frequency: 7.0 times per week    Types: Marijuana    Family Medical History: Family History  Problem Relation Age of Onset   Diabetes Mother    Hyperlipidemia Mother    Hypertension Mother    Diabetes Maternal Aunt    Cancer Maternal Aunt    Hyperlipidemia Maternal Aunt    Hypertension Maternal Aunt    Diabetes Maternal Uncle    Cancer Maternal Uncle    Hyperlipidemia Maternal Uncle    Hypertension Maternal Uncle     Physical Examination: There were no vitals filed for this visit.  General: Patient is in no apparent distress. Attention to examination is appropriate.  Neck:   Supple.  Full range of motion.  Respiratory: Patient is breathing without any difficulty.   NEUROLOGICAL:     Awake, alert, oriented to person, place, and time.  Speech is clear and fluent.   Cranial Nerves: Pupils equal round and reactive to light.  Facial  tone is symmetric.  Facial sensation is symmetric. Shoulder shrug is symmetric. Tongue protrusion is midline.  There is no pronator drift.  Strength: Side Biceps Triceps Deltoid Interossei Grip Wrist Ext. Wrist Flex.  R 5 5 5 5 5 5 5   L 5 5 5 5 5 5 5    Side Iliopsoas Quads Hamstring PF DF EHL  R 5 5 5 5 5 5   L 5 5 5 5 5 5    Reflexes are ***2+ and symmetric at the biceps, triceps, brachioradialis, patella and achilles.   Hoffman's is absent.   Bilateral upper and lower extremity sensation is intact to light touch.    No evidence of dysmetria noted.  Gait is normal.     Medical Decision Making  Imaging: ***  I have personally reviewed the images and agree with the above interpretation.  Assessment and Plan: Scott Howell is a pleasant 53 y.o. male with ***    Thank you for involving me in the care of this patient.      [name] K. Clois MD, [facility] Neurosurgery

## 2023-02-22 ENCOUNTER — Ambulatory Visit: Payer: MEDICAID

## 2023-02-22 ENCOUNTER — Encounter: Payer: MEDICAID | Admitting: Physical Therapy

## 2023-02-22 DIAGNOSIS — R262 Difficulty in walking, not elsewhere classified: Secondary | ICD-10-CM

## 2023-02-22 DIAGNOSIS — R278 Other lack of coordination: Secondary | ICD-10-CM | POA: Diagnosis not present

## 2023-02-22 DIAGNOSIS — M6281 Muscle weakness (generalized): Secondary | ICD-10-CM

## 2023-02-22 DIAGNOSIS — M25631 Stiffness of right wrist, not elsewhere classified: Secondary | ICD-10-CM

## 2023-02-22 DIAGNOSIS — G5621 Lesion of ulnar nerve, right upper limb: Secondary | ICD-10-CM

## 2023-02-22 DIAGNOSIS — M25641 Stiffness of right hand, not elsewhere classified: Secondary | ICD-10-CM

## 2023-02-22 DIAGNOSIS — M542 Cervicalgia: Secondary | ICD-10-CM

## 2023-02-22 NOTE — Therapy (Signed)
 OUTPATIENT OCCUPATIONAL THERAPY ORTHO TREATMENT NOTE   Patient Name: Scott Howell MRN: [id number] DOB:18-Oct-1970, 53 y.o., male Today's Date: 02/22/2023  PCP: Dr. Sharyle Fischer REFERRING PROVIDER: Dr. Penne Sharps   END OF SESSION:  OT End of Session - 02/22/23 1406     Visit Number 24    Number of Visits 24    Date for OT Re-Evaluation 05/01/23    Authorization Time Period Progress reporting period beginning 02/02/23    Progress Note Due on Visit 10    OT Start Time 1400    OT Stop Time 1445    OT Time Calculation (min) 45 min    Activity Tolerance Patient tolerated treatment well    Behavior During Therapy WFL for tasks assessed/performed            Past Medical History:  Diagnosis Date   Adenomatous polyp of colon    Arthritis    Asthma    Benign fibroma of prostate    Bipolar 1 disorder (HCC)    Cervical myelopathy (HCC)    Cervical post-laminectomy syndrome    Cervical spinal cord compression (HCC)    Chronic knee pain    Chronic pain associated with significant psychosocial dysfunction    Chronic prescription benzodiazepine use    Constipation    COPD (chronic obstructive pulmonary disease) (HCC)    Decreased motor strength    Drug abuse, opioid type (HCC)    Dyspnea    Elevated liver enzymes    Erectile dysfunction    Fibromyalgia    GERD (gastroesophageal reflux disease)    H/O ETOH abuse    Headache    High cholesterol    History of 2019 novel coronavirus disease (COVID-19) 11/01/2021   History of transient cerebral ischemia    Homicidal ideation    Hypertension    Hypokalemia    Living accommodation issues    a.) as of 03/29/2021 --> living in camper   Lower back pain    Lumbar canal stenosis    Marijuana use    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   Pneumonia    Presence of permanent cardiac pacemaker    S/P TKR (total knee replacement)    Schizophrenia (HCC)    Seizures (HCC) 11/2020   Status post hardware removal     Stroke (HCC)    no deficits   Tobacco use    Past Surgical History:  Procedure Laterality Date   ANTERIOR CERVICAL DECOMP/DISCECTOMY FUSION N/A 01/13/2021   Procedure: C3-5 ANTERIOR CERVICAL DECOMPRESSION/DISCECTOMY FUSION;  Surgeon: Clois Fret, MD;  Location: [facility];  Service: Neurosurgery;  Laterality: N/A;   CERVICAL SPINE SURGERY     4   CLAVICLE SURGERY     COLONOSCOPY WITH PROPOFOL  N/A 06/29/2022   Procedure: COLONOSCOPY WITH PROPOFOL ;  Surgeon: Therisa Bi, MD;  Location: [facility]OS[facility];  Service: Gastroenterology;  Laterality: N/A;  USES MED TRANS; WILL NEED TIME ON FRIDAY, 06/24/2022   HARDWARE REMOVAL Left 04/20/2015   Procedure: HARDWARE REMOVAL left leg;  Surgeon: Kayla Pinal, MD;  Location: [facility];  Service: Orthopedics;  Laterality: Left;   KNEE SURGERY Left    NECK SURGERY     x6   SHOULDER SURGERY Left    TOTAL KNEE ARTHROPLASTY Left 04/12/2021   Procedure: TOTAL KNEE ARTHROPLASTY;  Surgeon: Leora Lynwood SAUNDERS, MD;  Location: [facility];  Service: Orthopedics;  Laterality: Left;   ULNAR NERVE TRANSPOSITION Right 11/17/2021   Procedure: SUBCUTANEOUS TRANSPOSITION OF ULNAR  NERVE, RIGHT ELBOW;  Surgeon: Edie Norleen PARAS, MD;  Location: [facility];  Service: Orthopedics;  Laterality: Right;   ULNAR NERVE TRANSPOSITION Right 10/04/2022   Procedure: ULNAR NERVE TRANSPOSITION;  Surgeon: Claudene Penne ORN, MD;  Location: [facility];  Service: Neurosurgery;  Laterality: Right;  NO BLOCK   Patient Active Problem List   Diagnosis Date Noted   Ulnar neuropathy of right upper extremity 10/05/2022   Hand weakness 10/05/2022   Nerve pain 10/05/2022   Adenomatous polyp of colon 06/29/2022   S/P TKR (total knee replacement) using cement, left 04/12/2021   Cervical myelopathy (HCC) 01/13/2021   Seizure (HCC) 11/26/2020   Abnormal thyroid blood test 04/06/2016   Encounter for screening colonoscopy 01/05/2016   Abnormal weight gain 01/05/2016   Chronic prescription benzodiazepine use  10/19/2015   Elevated liver enzymes 08/20/2015   Medication monitoring encounter 08/20/2015   Hypokalemia 08/20/2015   Erectile dysfunction 04/22/2015   Status post hardware removal 04/20/2015   Bipolar 2 disorder (HCC) 07/29/2014   Marijuana abuse 07/29/2014   Chronic knee pain 07/29/2014   Bipolar 1 disorder, mixed (HCC)    Drug abuse, opioid type (HCC) 06/28/2013   GERD without esophagitis 10/03/2012   Hypertension goal BP (blood pressure) < 140/90 10/03/2012   Chronic pain associated with significant psychosocial dysfunction 07/17/2012   Lumbar canal stenosis 04/09/2012   Hypercholesterolemia 01/06/2012   Constipation 11/11/2011   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/2010   Decreased motor strength 09/30/2010   Current tobacco use 09/30/2010   H/O transient cerebral ischemia 09/09/2010   Low back pain 08/30/2010   Cervical spinal cord compression (HCC) 07/22/2010   Cervical post-laminectomy syndrome 07/22/2010   Chronic obstructive pulmonary disease (HCC) 02/23/2010   ONSET DATE: 10/04/22 (most recent sx)  REFERRING DIAG: G56.21 (ICD-10-CM) - Ulnar neuropathy of right upper extremity   THERAPY DIAG:  Muscle weakness (generalized)  Other lack of coordination  Ulnar neuropathy at elbow of right upper extremity  Stiffness of right hand, not elsewhere classified  Stiffness of right wrist, not elsewhere classified  Rationale for Evaluation and Treatment: Rehabilitation  SUBJECTIVE:  SUBJECTIVE STATEMENT: Pt reported being able to use his R hand to open up a flip top soup can this week.  Pt accompanied by: self  PERTINENT HISTORY:  Per note from Dr. Claudene on 11/16/22: [name] is 2 weeks status post Ulnar nerve decompression, AIN to ulnar nerve transfer, and ulnar nerve peripheral nerve stimulator placement. Given oxycodone  on discharge from the hospital.   Per medical record, pt underwent first ulnar nerve transposition on 11/17/21 and participated in OT post  surgery.  2nd ulnar nerve transposition done on 10/04/22.  Pt had nerve stimulator placed but pt reports he does not like how it feels so he does not turn it on.  Pt also with hx of 6 cervical spinal surgeries; see above for additional hx.    PRECAUTIONS: None  RED FLAGS: None   WEIGHT BEARING RESTRICTIONS: No  PAIN: 02/22/23: R wrist/hand 4/10 pain R arm and hand, improves with heat  Are you having pain? Yes: NPRS scale: 2/10 at rest, with activity 6-7/10 Pain location: ulnar side of R arm extending from elbow to hand Pain description: pins and needles, occasional spasms, numbness Aggravating factors: my arm doesn't like the cold, increased activity  Relieving factors: heat, rest, Gabapentin   LIVING ENVIRONMENT: Lives with: alone with 1 dog  PLOF: Independent, works as a scientist, water quality  PATIENT GOALS: build up the strength in the  R arm and hand  NEXT MD VISIT:  OBJECTIVE:  Note: Objective measures were completed at Evaluation unless otherwise noted.  HAND DOMINANCE: Right  ADLs: Overall ADLs: Pt reports he always tries to use the R arm as able, but in a limited capacity, and is unable to use the R hand with good accuracy or efficiency. Transfers/ambulation related to ADLs: indep Eating: gross grasp in R hand to hold utensils, difficulty cutting food  Grooming: uses L non-dominant hand to shave  UB Dressing: difficulty with clothing fasteners LB Dressing: difficulty with clothing fasteners, extra time to tie shoe laces  Toileting: uses L non-dominant hand for toilet hygiene Bathing: Pt states that he makes himself use the R hand when bathing but control is limited  FUNCTIONAL OUTCOME MEASURES: FOTO: 50; predicted with 56 12/26/22: FOTO: 49 02/02/23: FOTO 50  UPPER EXTREMITY ROM:     Active ROM Left eval Right eval Right 12/26/22 Right 02/02/23  Shoulder flexion      Shoulder abduction      Shoulder adduction      Shoulder extension      Shoulder internal rotation       Shoulder external rotation      Elbow flexion      Elbow extension      Wrist flexion      Wrist extension 65 58 60 60  Wrist ulnar deviation WNL -30 -30 -30 ( able to achieve neutral wrist with gravity assisted)  Wrist radial deviation WNL WNL WNL WNL  Wrist pronation 90 90 90 90  Wrist supination 75 70 75 80  (Blank rows = not tested)  Active ROM Right eval Left eval  Thumb MCP (0-60)    Thumb IP (0-80)    Thumb Radial abd/add (0-55)     Thumb Palmar abd/add (0-45)     Thumb Opposition to Small Finger     Index MCP (0-90)     Index PIP (0-100)     Index DIP (0-70)      Long MCP (0-90)      Long PIP (0-100)      Long DIP (0-70)      Ring MCP (0-90)      Ring PIP (0-100)      Ring DIP (0-70)      Little MCP (0-90)      Little PIP (0-100)      Little DIP (0-70)      (Blank rows = not tested)  Eval: R hand digits limited in PIP extension in 4th and 5th digits with neutral wrist (lacking ~25% ext in 4th digit PIP and 50% ext in 5th digit PIP) 12/26/22: R hand lacking ~10% ext in 4th digit PIP and lacking ~30% ext in 5th digit PIP 02/02/23: R hand lacking ~10% ext in 4th digit PIP and lacking ~30% ext in 5th digit PIP  Eval: -limited active digit abd in 4th and 5th digits 12/26/22: able to achieve slight digit abduction in 4th and 5th digits when compensating with wrist and MP flexion  02/02/23: Same as 12/26/22; fingers can abd on table top but by radially deviating    Eval: -Unable to oppose R 4th and 5th digits to thumb  12/26/22: unable to oppose R 4th and 5th digits to thumb 02/02/23: able to oppose R 4th and 5th digit to thumb, but with increased effort  UPPER EXTREMITY MMT:     MMT Left eval Right eval Right 12/26/22  Shoulder flexion     Shoulder  abduction     Shoulder adduction     Shoulder extension     Shoulder internal rotation     Shoulder external rotation     Middle trapezius     Lower trapezius     Elbow flexion 5 5   Elbow extension 5 5    Wrist flexion 5 4 4+  Wrist extension 5 4+ (simultaneous radial dev) 4+ (slight radial dev)  Wrist ulnar deviation 5 1   Wrist radial deviation 5 5   Wrist pronation 5 5   Wrist supination 5 5   (Blank rows = not tested)  HAND FUNCTION: Grip strength: Right: 26 lbs; Left: 95 lbs, Lateral pinch: Right: 7 lbs, Left: 32 lbs, and 3 point pinch: Right: 4 lbs, Left: 27 lbs 12/12/22: R grip 35 lbs; R lateral pinch: 8 lbs: 3 point pinch: 4 lbs  12/26/22: R grip 33 lbs; R lateral pinch: 8 lbs; 3 point pinch: 4 lbs (Pt reports increased fatigue in R hand today following extensive use of the hand over the weekend when using a tool) 02/02/23: R grip 32 lbs; R lateral pinch: 4 lbs, 3 point pinch: 4 lbs (Pt reports using tools and shovels extensively over the last 2 days, possibly limited by muscle fatigue from overuse)  COORDINATION: 9 Hole Peg test: Right: 2 min 39 sec; Left: 26 sec 12/26/22: R 48 secs 02/02/23: R 51 sec   SENSATION: Light touch: Impaired ; ulnar nerve distribution  EDEMA: very mild edema surrounding incision sites on R forearm and elbow   COGNITION: Overall cognitive status: Within functional limits for tasks assessed Areas of impairment:  hx of bipolar 1, schizophrenia, and drug and alcohol abuse  OBSERVATIONS:  Pt pleasant, cooperative, and eager to regain strength in his R dominant arm.  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   Note for education/handouts: Pt reports he is limited with his reading and writing ability, but states he will always ask if he doesn't understand something.  TODAY'S TREATMENT:                                                                                                                              DATE: 02/22/23 Therapeutic Exercise: -Facilitated RUE flexibility exercises: -Completed passive stretching for R wrist and digit ext, ulnar deviation, R forearm sup -Performed slow, passive proximal  ulnar nerve glides x3, OT assisted to achieve max end range stretch with arm abducted to 90 degrees, and elbow, wrist, and digits extended, both with forearm in the supinated and pronated positions.  -Completed AAROM for R wrist and digits, including digit abd/add, single digit taps (max A for digits 2-5), and radial deviation for 2 sets 10 reps each  Therapeutic Activity: -Facilitated R forearm, wrist, and hand strengthening with participation in EZ board tools.  Pt worked with long handled tool to facilitate R wrist flex/ext, small and large base key turn,  and small and large dial turn x3 reps for each tool (up/down board=1 rep).  Rest breaks between sets and min vc for technique to maximize ROM with each tool rotation against wide strip of velcro resistance.  Narrow velcro strip needed for small base key turn, with intermittent min A to maintain levelness of tool on velcro strip.  -Facilitated R FMC/dexterity skills working to rotate jumbo peg 180* within fingertips, requiring mod for technique and additional compensation of stabilizing peg on pegboard x 25 reps. -Facilitated item storage and translatory skills working with small manipulatives (jack, marble, poker chips), storing items in palm, and moving them 1 by 1 from palm to fingertips to enable discarding from hand.  Practiced digit extension with supinated forearm to have these items placed and released from R hand.    PATIENT EDUCATION: Education details: R hand FMC/dexterity skills Person educated: Patient Education method: Explanation Education comprehension: verbalized understanding, demonstrated understanding with vc and tactile cues  HOME EXERCISE PROGRAM: Yellow theraputty, wrist/forearm strengthening with 2# dumbbell, ulnar nerve glides  GOALS: Goals reviewed with patient? Yes  SHORT TERM GOALS: Target date: 01/02/23  Pt will be indep to perform HEP for improving distal RUE flexibility, strength, and coordination. Baseline:  Eval: Not yet initiated; 12/26/22: pt routinely works on resistive hand strengthening exercises with tools and putty at home, as well as stretching wrist and digits, and FMC activities.  Goal status: achieved  LONG TERM GOALS: Target date: 02/13/23  Pt will increase FOTO score to 56 or better to indicate improvement in self perceived functional use of the R arm with daily tasks. Baseline: Eval: 50; 12/26/22: 49; 02/02/23: 50 Goal status: ongoing  2.  Pt will increase R grip strength by 10 or more lbs to ease ability carry heavy grocery bags in R dominant hand. Baseline: Eval: R grip 26 lbs (non-dominant L 95 lbs); 12/26/22: R grip 33 lbs; 02/02/23: R grip 32 lbs Goal status: ongoing  3.  Pt will increase R lateral pinch strength by 5 or more lbs to ease ability to open drink bottles. Baseline: Eval: R 7 lbs (non-dominant L 32 lbs); 12/26/22: R 8 lbs; 02/02/23: 4 lbs  Goal status: ongoing  4.  Pt will tolerate manual therapy, therapeutic modalities, and exercises to decrease pain in RUE to a reported 4/10 pain or less with activity.   Baseline: Eval: R forearm and hand 6-7/10 pain with activity; 12/26/22: Pt reports increased pain today after increased use of R hand over the weekend; still at 6-7/10 pain with activity; 02/02/23: 1/10 pain at rest, 3.5/10 pain with activity Goal status: ongoing  5.  Pt will increase R hand FMC/dexterity skills to improve efficiency with clothing fasteners as noted by completion of 9 hole peg test in <40 sec (revised on 12/26/22 from <2 min).  Baseline: Eval: 2 min 39 sec; extra time clothing fasteners, specifically buttons and tying shoe laces; 12/26/22: R 48 sec; 02/02/23: 51 sec  Goal status: ongoing  ASSESSMENT:   CLINICAL IMPRESSION:   Pt continues to report functional improvements with his R hand stating that he was able to open a flip top soup can this week.  Pt continues to require manual assist from OT during ulnar nerve glide and to achieve max end  range stretch proximally and to perform with correct form.  Pt continues to present with difficulty repositioning small items within palm and fingertips d/t limited digit isolation in all digits.  Pt will continue to benefit from skilled OT to  address RUE weakness, coordination deficits, stiffness, and pain in the RUE, in order to work towards increased functional use of the R arm for ADL/IADL/work tasks.    PERFORMANCE DEFICITS: in functional skills including ADLs, IADLs, coordination, dexterity, sensation, edema, ROM, strength, pain, fascial restrictions, muscle spasms, flexibility, Fine motor control, body mechanics, decreased knowledge of use of DME, skin integrity, and UE functional use, and psychosocial skills including coping strategies, environmental adaptation, habits, and routines and behaviors.   IMPAIRMENTS: are limiting patient from ADLs, IADLs, rest and sleep, work, and leisure.   COMORBIDITIES: has co-morbidities such as hx of 6 cervical spinal surgeries, bipolar 1, schizophrenia  that affects occupational performance. Patient will benefit from skilled OT to address above impairments and improve overall function.  MODIFICATION OR ASSISTANCE TO COMPLETE EVALUATION: No modification of tasks or assist necessary to complete an evaluation.  OT OCCUPATIONAL PROFILE AND HISTORY: Problem focused assessment: Including review of records relating to presenting problem.  CLINICAL DECISION MAKING: Moderate - several treatment options, min-mod task modification necessary  REHAB POTENTIAL: Good  EVALUATION COMPLEXITY: Moderate      PLAN:  OT FREQUENCY: 2x/week  OT DURATION: 12 weeks  PLANNED INTERVENTIONS: self care/ADL training, therapeutic exercise, therapeutic activity, neuromuscular re-education, manual therapy, scar mobilization, passive range of motion, splinting, electrical stimulation, paraffin, moist heat, cryotherapy, contrast bath, patient/family education, cognitive  remediation/compensation, psychosocial skills training, coping strategies training, and DME and/or AE instructions  RECOMMENDED OTHER SERVICES: None at this time  CONSULTED AND AGREED WITH PLAN OF CARE: Patient  PLAN FOR NEXT SESSION: see above  Inocente Blazing, MS, OTR/L  Inocente MARLA Blazing, OT 02/22/2023, 2:40 PM

## 2023-02-23 ENCOUNTER — Ambulatory Visit: Payer: MEDICAID | Admitting: Neurosurgery

## 2023-02-27 ENCOUNTER — Ambulatory Visit: Payer: MEDICAID

## 2023-02-27 NOTE — Progress Notes (Signed)
 Referring Physician:  Bernardo Fend, DO [address] [place],  [place] [address]  Primary Physician:  Bernardo Fend, DO  History of Present Illness: 02/28/2023 [name] is here today with a chief complaint of neck pain.  He is having some problems with his balance.  He had a right ulnar nerve transposition in the summer and is recovering from that.  His hand function has improved.  The symptoms are causing a significant impact on the patient's life.   I have utilized the care everywhere function in epic to review the outside records available from external health systems.  Progress Note from [place], [place] on 12/26/22:   [name] has a history of seizures, HTN, hyperlipidemia, COPD, bipolar, FM, history of ETOH abuse, and GERD.    He is s/p ulnar nerve decompression, AIN to ulnar nerve transfer, and ulnar nerve peripheral nerve stimulator placement by Dr. Claudene on 10/04/22.    His pain was improving at his last visit, but he did not like the feeling of the stimulator, so he stopped using it. Dr. Claudene sent him to OT and he was to follow up prn.    History of ACDF C3-C5 for myelopathy on 01/13/21 with Dr. Clois. History of dysfunction in right hand due to history of multiple fractures.    PMR referred him to [facility] for left C7-T1 IL ESI. This has not been done.    He feels like his right hand is improving- he is on OT for this and has appointment later today.    He is now having constant neck pain that radiates to both shoulder blades. No arm pain. Neck pain is sharp and stabbing. Worse when he moves his head. Some relief with heat and resting. He has numbness, tingling, weakness in right ring and small finger. No numbness, tingling, or weakness in his left hand.     He is taking neurontin , mobic , and zanaflex . He is afraid of needles and doesn't think he will be able to have cervical injection.    Bowel/Bladder Dysfunction: none   He  smokes 1 and 1/2 ppd x 30 years.    Conservative measures:  Physical therapy: no recent Multimodal medical therapy including regular antiinflammatories: celebrex, robaxin , oxycodone   Injections: No recent epidural steroid injections   Past Surgery:  History of ACDF C3-C5 for myelopathy on 01/13/21 with Dr. Clois History of 5 previous cervical fusion surgeries  Review of Systems:  A 10 point review of systems is negative, except for the pertinent positives and negatives detailed in the HPI.  Past Medical History: Past Medical History:  Diagnosis Date   Adenomatous polyp of colon    Arthritis    Asthma    Benign fibroma of prostate    Bipolar 1 disorder (HCC)    Cervical myelopathy (HCC)    Cervical post-laminectomy syndrome    Cervical spinal cord compression (HCC)    Chronic knee pain    Chronic pain associated with significant psychosocial dysfunction    Chronic prescription benzodiazepine use    Constipation    COPD (chronic obstructive pulmonary disease) (HCC)    Decreased motor strength    Drug abuse, opioid type (HCC)    Dyspnea    Elevated liver enzymes    Erectile dysfunction    Fibromyalgia    GERD (gastroesophageal reflux disease)    H/O ETOH abuse    Headache    High cholesterol    History of 2019 novel coronavirus  disease (COVID-19) 11/01/2021   History of transient cerebral ischemia    Homicidal ideation    Hypertension    Hypokalemia    Living accommodation issues    a.) as of 03/29/2021 --> living in camper   Lower back pain    Lumbar canal stenosis    Marijuana use    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   Pneumonia    Presence of permanent cardiac pacemaker    S/P TKR (total knee replacement)    Schizophrenia (HCC)    Seizures (HCC) 11/2020   Status post hardware removal    Stroke (HCC)    no deficits   Tobacco use     Past Surgical History: Past Surgical History:  Procedure Laterality Date   ANTERIOR CERVICAL  DECOMP/DISCECTOMY FUSION N/A 01/13/2021   Procedure: C3-5 ANTERIOR CERVICAL DECOMPRESSION/DISCECTOMY FUSION;  Surgeon: Clois Fret, MD;  Location: [facility];  Service: Neurosurgery;  Laterality: N/A;   CERVICAL SPINE SURGERY     4   CLAVICLE SURGERY     COLONOSCOPY WITH PROPOFOL  N/A 06/29/2022   Procedure: COLONOSCOPY WITH PROPOFOL ;  Surgeon: Therisa Bi, MD;  Location: [facility];  Service: Gastroenterology;  Laterality: N/A;  USES MED TRANS; WILL NEED TIME ON FRIDAY, 06/24/2022   HARDWARE REMOVAL Left 04/20/2015   Procedure: HARDWARE REMOVAL left leg;  Surgeon: Kayla Pinal, MD;  Location: [facility];  Service: Orthopedics;  Laterality: Left;   KNEE SURGERY Left    NECK SURGERY     x6   SHOULDER SURGERY Left    TOTAL KNEE ARTHROPLASTY Left 04/12/2021   Procedure: TOTAL KNEE ARTHROPLASTY;  Surgeon: Leora Lynwood SAUNDERS, MD;  Location: [facility];  Service: Orthopedics;  Laterality: Left;   ULNAR NERVE TRANSPOSITION Right 11/17/2021   Procedure: SUBCUTANEOUS TRANSPOSITION OF ULNAR NERVE, RIGHT ELBOW;  Surgeon: Edie Norleen PARAS, MD;  Location: [facility];  Service: Orthopedics;  Laterality: Right;   ULNAR NERVE TRANSPOSITION Right 10/04/2022   Procedure: ULNAR NERVE TRANSPOSITION;  Surgeon: Claudene Penne ORN, MD;  Location: [facility];  Service: Neurosurgery;  Laterality: Right;  NO BLOCK    Allergies: Allergies as of 02/28/2023 - Review Complete 02/28/2023  Allergen Reaction Noted   Acetaminophen  Other (See Comments) 07/06/2014    Medications:  Current Outpatient Medications:    albuterol  (VENTOLIN  HFA) 108 (90 Base) MCG/ACT inhaler, INHALE 2 PUFFS INTO LUNGS EVERY 6 HOURS AS NEEDED FOR WHEEZING, Disp: 18 each, Rfl: 2   divalproex  (DEPAKOTE ) 250 MG DR tablet, Take 1 tablet (250 mg total) by mouth 3 (three) times daily., Disp: 90 tablet, Rfl: 1   famotidine  (PEPCID ) 20 MG tablet, Take 1 tablet (20 mg total) by mouth 2 (two) times daily., Disp: 180 tablet, Rfl: 1   gabapentin  (NEURONTIN ) 300 MG  capsule, Take 1 capsule (300 mg total) by mouth 3 (three) times daily., Disp: 270 capsule, Rfl: 3   atorvastatin  (LIPITOR) 40 MG tablet, TAKE 1 TABLET BY MOUTH EVERY DAY, Disp: 90 tablet, Rfl: 1   budesonide -formoterol  (SYMBICORT ) 160-4.5 MCG/ACT inhaler, Inhale 2 puffs into the lungs 2 (two) times daily., Disp: 1 each, Rfl: 3   ipratropium-albuterol  (DUONEB) 0.5-2.5 (3) MG/3ML SOLN, Take 3 mLs by nebulization 2 (two) times daily. (Patient not taking: Reported on 02/28/2023), Disp: 360 mL, Rfl: 1   lisinopril  (ZESTRIL ) 10 MG tablet, Take 1 tablet (10 mg total) by mouth daily., Disp: 90 tablet, Rfl: 1   meloxicam  (MOBIC ) 15 MG tablet, Take 15 mg by mouth at bedtime., Disp: ,  Rfl:    QUEtiapine  (SEROQUEL ) 200 MG tablet, Take 500 mg by mouth at bedtime., Disp: , Rfl:    QUEtiapine  (SEROQUEL ) 200 MG tablet, Take 200 mg by mouth at bedtime., Disp: , Rfl:    tiZANidine  (ZANAFLEX ) 4 MG capsule, Take 2 mg by mouth 3 (three) times daily., Disp: , Rfl:   Social History: Social History   Tobacco Use   Smoking status: Every Day    Current packs/day: 1.50    Average packs/day: 1.5 packs/day for 30.0 years (45.0 ttl pk-yrs)    Types: Cigarettes   Smokeless tobacco: Never  Vaping Use   Vaping status: Never Used  Substance Use Topics   Alcohol use: No    Comment: Sober for 10 yrs.   Drug use: Yes    Frequency: 7.0 times per week    Types: Marijuana    Family Medical History: Family History  Problem Relation Age of Onset   Diabetes Mother    Hyperlipidemia Mother    Hypertension Mother    Diabetes Maternal Aunt    Cancer Maternal Aunt    Hyperlipidemia Maternal Aunt    Hypertension Maternal Aunt    Diabetes Maternal Uncle    Cancer Maternal Uncle    Hyperlipidemia Maternal Uncle    Hypertension Maternal Uncle     Physical Examination: Vitals:   02/28/23 1348  BP: (!) 140/90    General: Patient is in no apparent distress. Attention to examination is appropriate.  Neck:   Supple.   Full range of motion.  Respiratory: Patient is breathing without any difficulty.   NEUROLOGICAL:     Awake, alert, oriented to person, place, and time.  Speech is clear and fluent.   Cranial Nerves: Pupils equal round and reactive to light.  Facial tone is symmetric.  Facial sensation is symmetric. Shoulder shrug is symmetric. Tongue protrusion is midline.  There is no pronator drift.  Strength: Side Biceps Triceps Deltoid Interossei Grip Wrist Ext. Wrist Flex.  R 5 5 5 3  4- 4 4  L 5 5 5 5 5 5 5    Side Iliopsoas Quads Hamstring PF DF EHL  R 5 5 5 5 5 5   L 5 5 5 5 5 5    Reflexes are 2+ and symmetric at the biceps, triceps, brachioradialis, patella and achilles.   Hoffman's is absent.   Bilateral upper and lower extremity sensation is intact to light touch.    No evidence of dysmetria noted.  Gait is slowed.     Medical Decision Making  Imaging: MRI C spine 08/08/2022 IMPRESSION: 1. Status post interval extension of fusion to C3-C5, with persistent moderate to severe spinal canal stenosis and severe bilateral neural foraminal narrowing at C4-C5. 2. C3-C4 mild-to-moderate left and mild right neural foraminal narrowing, without residual spinal canal stenosis. 3. C5-C6 mild left neural foraminal narrowing, unchanged. 4. C6-C7 moderate left neural foraminal narrowing, unchanged. 5. C7-T1 mild spinal canal stenosis with severe right and moderate to severe left neural foraminal narrowing, unchanged. 6. T2 hyperintense foci in the bilateral anterior horn cells at the level of C6-C7 appear more prominent than on the prior exam but were likely present. This likely represents myelomalacia.     Electronically Signed   By: Donald Campion M.D.   On: 08/14/2022 21:29  I have personally reviewed the images and agree with the above interpretation.  EMG 07/18/2022 Impression: Abnormal study. There is electrodiagnostic evidence of a chronic, mild right ulnar mononeuropathy.  Thank  you for the  referral of this patient. It was our privilege to participate in care of your patient. Feel free to contact us  with any further questions.  _____________________________ Arthea Farrow, MD   Assessment and Plan: [name] is a pleasant 53 y.o. male with history of cervical myelopathy with cervical stenosis at C4-5.  Before making any plans, I would like to get a CT scan.  If he is fully fused, he may benefit from a C4-5 decompression posteriorly.  I have encouraged him to quit smoking.  I spent a total of 30 minutes in this patient's care today. This time was spent reviewing pertinent records including imaging studies, obtaining and confirming history, performing a directed evaluation, formulating and discussing my recommendations, and documenting the visit within the medical record.      Thank you for involving me in the care of this patient.      Kyarah Enamorado K. Clois MD, [facility] Neurosurgery

## 2023-02-28 ENCOUNTER — Ambulatory Visit (INDEPENDENT_AMBULATORY_CARE_PROVIDER_SITE_OTHER): Payer: MEDICAID | Admitting: Neurosurgery

## 2023-02-28 ENCOUNTER — Encounter: Payer: Self-pay | Admitting: Neurosurgery

## 2023-02-28 VITALS — BP 140/90 | Wt 241.2 lb

## 2023-02-28 DIAGNOSIS — G959 Disease of spinal cord, unspecified: Secondary | ICD-10-CM | POA: Diagnosis not present

## 2023-02-28 DIAGNOSIS — M4802 Spinal stenosis, cervical region: Secondary | ICD-10-CM | POA: Diagnosis not present

## 2023-02-28 DIAGNOSIS — M542 Cervicalgia: Secondary | ICD-10-CM

## 2023-02-28 DIAGNOSIS — Z981 Arthrodesis status: Secondary | ICD-10-CM | POA: Diagnosis not present

## 2023-02-28 NOTE — Addendum Note (Signed)
 Addended by: Ernie Hew on: 02/28/2023 03:58 PM   Modules accepted: Orders
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
## 2023-03-01 ENCOUNTER — Ambulatory Visit: Payer: MEDICAID

## 2023-03-01 DIAGNOSIS — R262 Difficulty in walking, not elsewhere classified: Secondary | ICD-10-CM

## 2023-03-01 DIAGNOSIS — M25631 Stiffness of right wrist, not elsewhere classified: Secondary | ICD-10-CM

## 2023-03-01 DIAGNOSIS — R278 Other lack of coordination: Secondary | ICD-10-CM

## 2023-03-01 DIAGNOSIS — M542 Cervicalgia: Secondary | ICD-10-CM

## 2023-03-01 DIAGNOSIS — M6281 Muscle weakness (generalized): Secondary | ICD-10-CM

## 2023-03-01 DIAGNOSIS — M25641 Stiffness of right hand, not elsewhere classified: Secondary | ICD-10-CM

## 2023-03-01 DIAGNOSIS — G5621 Lesion of ulnar nerve, right upper limb: Secondary | ICD-10-CM

## 2023-03-01 NOTE — Therapy (Signed)
OUTPATIENT OCCUPATIONAL THERAPY ORTHO RECERTIFICATION AND TREATMENT NOTE   Patient Name: Scott Howell MRN: [id number] DOB:1970/12/13, 53 y.o., male Today's Date: 03/03/2023  PCP: Dr. Margarita Mail REFERRING PROVIDER: Dr. Ernestine Mcmurray   END OF SESSION:  OT End of Session - 03/03/23 1129     Visit Number 25    Number of Visits 37    Date for OT Re-Evaluation 05/24/23    Authorization Time Period Progress reporting period beginning 02/02/23-03/01/23    Progress Note Due on Visit 10    OT Start Time 1315    OT Stop Time 1400    OT Time Calculation (min) 45 min    Activity Tolerance Patient tolerated treatment well    Behavior During Therapy WFL for tasks assessed/performed            Past Medical History:  Diagnosis Date   Adenomatous polyp of colon    Arthritis    Asthma    Benign fibroma of prostate    Bipolar 1 disorder (HCC)    Cervical myelopathy (HCC)    Cervical post-laminectomy syndrome    Cervical spinal cord compression (HCC)    Chronic knee pain    Chronic pain associated with significant psychosocial dysfunction    Chronic prescription benzodiazepine use    Constipation    COPD (chronic obstructive pulmonary disease) (HCC)    Decreased motor strength    Drug abuse, opioid type (HCC)    Dyspnea    Elevated liver enzymes    Erectile dysfunction    Fibromyalgia    GERD (gastroesophageal reflux disease)    H/O ETOH abuse    Headache    High cholesterol    History of 2019 novel coronavirus disease (COVID-19) 11/01/2021   History of transient cerebral ischemia    Homicidal ideation    Hypertension    Hypokalemia    Living accommodation issues    a.) as of 03/29/2021 --> living in camper   Lower back pain    Lumbar canal stenosis    Marijuana use    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   Pneumonia    Presence of permanent cardiac pacemaker    S/P TKR (total knee replacement)    Schizophrenia (HCC)    Seizures (HCC) 11/2020    Status post hardware removal    Stroke (HCC)    no deficits   Tobacco use    Past Surgical History:  Procedure Laterality Date   ANTERIOR CERVICAL DECOMP/DISCECTOMY FUSION N/A 01/13/2021   Procedure: C3-5 ANTERIOR CERVICAL DECOMPRESSION/DISCECTOMY FUSION;  Surgeon: Venetia Night, MD;  Location: [facility];  Service: Neurosurgery;  Laterality: N/A;   CERVICAL SPINE SURGERY     4   CLAVICLE SURGERY     COLONOSCOPY WITH PROPOFOL N/A 06/29/2022   Procedure: COLONOSCOPY WITH PROPOFOL;  Surgeon: Wyline Mood, MD;  Location: [facility];  Service: Gastroenterology;  Laterality: N/A;  USES MED TRANS; WILL NEED TIME ON FRIDAY, 06/24/2022   HARDWARE REMOVAL Left 04/20/2015   Procedure: HARDWARE REMOVAL left leg;  Surgeon: Deeann Saint, MD;  Location: [facility];  Service: Orthopedics;  Laterality: Left;   KNEE SURGERY Left    NECK SURGERY     x6   SHOULDER SURGERY Left    TOTAL KNEE ARTHROPLASTY Left 04/12/2021   Procedure: TOTAL KNEE ARTHROPLASTY;  Surgeon: Lyndle Herrlich, MD;  Location: [facility];  Service: Orthopedics;  Laterality: Left;   ULNAR NERVE TRANSPOSITION Right 11/17/2021   Procedure: SUBCUTANEOUS TRANSPOSITION  OF ULNAR NERVE, RIGHT ELBOW;  Surgeon: Christena Flake, MD;  Location: [facility];  Service: Orthopedics;  Laterality: Right;   ULNAR NERVE TRANSPOSITION Right 10/04/2022   Procedure: ULNAR NERVE TRANSPOSITION;  Surgeon: Lovenia Kim, MD;  Location: [facility];  Service: Neurosurgery;  Laterality: Right;  NO BLOCK   Patient Active Problem List   Diagnosis Date Noted   Ulnar neuropathy of right upper extremity 10/05/2022   Hand weakness 10/05/2022   Nerve pain 10/05/2022   Adenomatous polyp of colon 06/29/2022   S/P TKR (total knee replacement) using cement, left 04/12/2021   Cervical myelopathy (HCC) 01/13/2021   Seizure (HCC) 11/26/2020   Abnormal thyroid blood test 04/06/2016   Encounter for screening colonoscopy 01/05/2016   Abnormal weight gain 01/05/2016   Chronic  prescription benzodiazepine use 10/19/2015   Elevated liver enzymes 08/20/2015   Medication monitoring encounter 08/20/2015   Hypokalemia 08/20/2015   Erectile dysfunction 04/22/2015   Status post hardware removal 04/20/2015   Bipolar 2 disorder (HCC) 07/29/2014   Marijuana abuse 07/29/2014   Chronic knee pain 07/29/2014   Bipolar 1 disorder, mixed (HCC)    Drug abuse, opioid type (HCC) 06/28/2013   GERD without esophagitis 10/03/2012   Hypertension goal BP (blood pressure) < 140/90 10/03/2012   Chronic pain associated with significant psychosocial dysfunction 07/17/2012   Lumbar canal stenosis 04/09/2012   Hypercholesterolemia 01/06/2012   Constipation 11/11/2011   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/2010   Decreased motor strength 09/30/2010   Current tobacco use 09/30/2010   H/O transient cerebral ischemia 09/09/2010   Low back pain 08/30/2010   Cervical spinal cord compression (HCC) 07/22/2010   Cervical post-laminectomy syndrome 07/22/2010   Chronic obstructive pulmonary disease (HCC) 02/23/2010   ONSET DATE: 10/04/22 (most recent sx)  REFERRING DIAG: G56.21 (ICD-10-CM) - Ulnar neuropathy of right upper extremity   THERAPY DIAG:  Muscle weakness (generalized)  Other lack of coordination  Ulnar neuropathy at elbow of right upper extremity  Stiffness of right hand, not elsewhere classified  Stiffness of right wrist, not elsewhere classified  Rationale for Evaluation and Treatment: Rehabilitation  SUBJECTIVE:  SUBJECTIVE STATEMENT: Pt reports he is scheduled to have an MRI for his cervical spine next Wednesday. Pt accompanied by: self  PERTINENT HISTORY:  Per note from Dr. Katrinka Blazing on 11/16/22: [name] is 2 weeks status post Ulnar nerve decompression, AIN to ulnar nerve transfer, and ulnar nerve peripheral nerve stimulator placement. Given oxycodone on discharge from the hospital.   Per medical record, pt underwent first ulnar nerve transposition on 11/17/21 and  participated in OT post surgery.  2nd ulnar nerve transposition done on 10/04/22.  Pt had nerve stimulator placed but pt reports he does not like how it feels so he does not turn it on.  Pt also with hx of 6 cervical spinal surgeries; see above for additional hx.    PRECAUTIONS: None  RED FLAGS: None   WEIGHT BEARING RESTRICTIONS: No  PAIN: 03/01/23: 7/10 pain "all over, and increased in the RUE" (recent seizure); pain improves with rest, stretching, and use of heat Are you having pain? Yes: NPRS scale: 2/10 at rest, with activity 6-7/10 Pain location: ulnar side of R arm extending from elbow to hand Pain description: pins and needles, occasional spasms, numbness Aggravating factors: "my arm doesn't like the cold, increased activity  Relieving factors: heat, rest, Gabapentin  LIVING ENVIRONMENT: Lives with: alone with 1 dog  PLOF: Independent, works as a Scientist, water quality  PATIENT GOALS: build  up the strength in the R arm and hand  NEXT MD VISIT:  OBJECTIVE:  Note: Objective measures were completed at Evaluation unless otherwise noted.  HAND DOMINANCE: Right  ADLs: Overall ADLs: Pt reports he always tries to use the R arm as able, but in a limited capacity, and is unable to use the R hand with good accuracy or efficiency. Transfers/ambulation related to ADLs: indep Eating: gross grasp in R hand to hold utensils, difficulty cutting food  Grooming: uses L non-dominant hand to shave  UB Dressing: difficulty with clothing fasteners LB Dressing: difficulty with clothing fasteners, extra time to tie shoe laces  Toileting: uses L non-dominant hand for toilet hygiene Bathing: Pt states that he makes himself use the R hand when bathing but control is limited  FUNCTIONAL OUTCOME MEASURES: FOTO: 50; predicted with 56 12/26/22: FOTO: 49 02/02/23: FOTO: 50 03/01/23: FOTO: 59   UPPER EXTREMITY ROM:     Active ROM Left eval Right eval Right 12/26/22 Right 02/02/23 Right 03/01/23   Shoulder flexion       Shoulder abduction       Shoulder adduction       Shoulder extension       Shoulder internal rotation       Shoulder external rotation       Elbow flexion       Elbow extension       Wrist flexion       Wrist extension 65 58 60 60 60 (Mps flexed)  Wrist ulnar deviation WNL -30 -30 -30 ( able to achieve neutral wrist with gravity assisted) -25  Wrist radial deviation WNL WNL WNL WNL   Wrist pronation 90 90 90 90 90  Wrist supination 75 70 75 80 80  (Blank rows = not tested)  Active ROM Right eval Left eval  Thumb MCP (0-60)    Thumb IP (0-80)    Thumb Radial abd/add (0-55)     Thumb Palmar abd/add (0-45)     Thumb Opposition to Small Finger     Index MCP (0-90)     Index PIP (0-100)     Index DIP (0-70)      Long MCP (0-90)      Long PIP (0-100)      Long DIP (0-70)      Ring MCP (0-90)      Ring PIP (0-100)      Ring DIP (0-70)      Little MCP (0-90)      Little PIP (0-100)      Little DIP (0-70)      (Blank rows = not tested)  Eval: R hand digits limited in PIP extension in 4th and 5th digits with neutral wrist (lacking ~25% ext in 4th digit PIP and 50% ext in 5th digit PIP) 12/26/22: R hand lacking ~10% ext in 4th digit PIP and lacking ~30% ext in 5th digit PIP 02/02/23: R hand lacking ~10% ext in 4th digit PIP and lacking ~30% ext in 5th digit PIP 03/01/23: R hand lacking ~ R finger lacking about 25% ext, 5th digit lacking ~50% ext (feels bad today)  Eval: -limited active digit abd in 4th and 5th digits 12/26/22: able to achieve slight digit abduction in 4th and 5th digits when compensating with wrist and MP flexion  02/02/23: Same as 12/26/22; fingers can abd on table top but by radially deviating  03/01/23: Pt can achieve minimal abduction of 4th and 5th digits if wrist is slightly flexed  Eval: -Unable to oppose R 4th and 5th digits to thumb  12/26/22: unable to oppose R 4th and 5th digits to thumb 02/02/23: able to oppose R 4th and 5th  digit to thumb, but with increased effort 03/01/23: able to oppose R 4th digit to thumb, able to manage 5th digit if stabilizing digits 2-4 in extension   UPPER EXTREMITY MMT:     MMT Left eval Right eval Right 12/26/22 Right 03/01/23  Shoulder flexion      Shoulder abduction      Shoulder adduction      Shoulder extension      Shoulder internal rotation      Shoulder external rotation      Middle trapezius      Lower trapezius      Elbow flexion 5 5    Elbow extension 5 5    Wrist flexion 5 4 4+ 4+  Wrist extension 5 4+ (simultaneous radial dev) 4+ (slight radial dev) 4+ (slight radial dev)  Wrist ulnar deviation 5 1  2   Wrist radial deviation 5 5  5   Wrist pronation 5 5  5   Wrist supination 5 5  5   (Blank rows = not tested)  HAND FUNCTION: Grip strength: Right: 26 lbs; Left: 95 lbs, Lateral pinch: Right: 7 lbs, Left: 32 lbs, and 3 point pinch: Right: 4 lbs, Left: 27 lbs 12/12/22: R grip 35 lbs; R lateral pinch: 8 lbs: 3 point pinch: 4 lbs  12/26/22: R grip 33 lbs; R lateral pinch: 8 lbs; 3 point pinch: 4 lbs (Pt reports increased fatigue in R hand today following extensive use of the hand over the weekend when using a tool) 02/02/23: R grip 32 lbs; R lateral pinch: 4 lbs, 3 point pinch: 4 lbs (Pt reports using tools and shovels extensively over the last 2 days, possibly limited by muscle fatigue from overuse) 03/01/23: R grip 30 lbs (more painful today); L grip 96 lbs; R lateral pinch: R 8 lbs, L 27 lbs; 3 point pinch: R 4 lbs,  L 19 lbs  COORDINATION: 9 Hole Peg test: Right: 2 min 39 sec; Left: 26 sec 12/26/22: R 48 secs 02/02/23: R 51 sec  03/01/23: R 46 sec   SENSATION: Light touch: Impaired ; ulnar nerve distribution  EDEMA: very mild edema surrounding incision sites on R forearm and elbow   COGNITION: Overall cognitive status: Within functional limits for tasks assessed Areas of impairment:  hx of bipolar 1, schizophrenia, and drug and alcohol abuse  OBSERVATIONS:   Pt pleasant, cooperative, and eager to regain strength in his R dominant arm.  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   Note for education/handouts: Pt reports he is limited with his reading and writing ability, but states he will always ask if he doesn't understand something.  TODAY'S TREATMENT:  DATE: 03/01/23 Therapeutic Activity: Objective measures taken and goals updated and reviewed for recertification note.  Neuro re-ed: Facilitated [facility] and dexterity skills with the R hand, working to pick up glass stones from dish, store in palm, and use translatory movements moving [place] stones between palm and fingertips in prep for discarding stones 1 at a time into dish.  Facilitated high reps of digit extension working to scoop a number of stones into palm, then forearm rotation to supinated forearm with a closed fist to store stones, then pronation and digit ext to discard stones from palm.  PATIENT EDUCATION: Education details: R hand FMC/dexterity skills, progress towards goals Person educated: Patient Education method: Explanation Education comprehension: verbalized understanding, demonstrated understanding with vc and tactile cues  HOME EXERCISE PROGRAM: Yellow theraputty, wrist/forearm strengthening with 2# dumbbell, ulnar nerve glides  GOALS: Goals reviewed with patient? Yes  SHORT TERM GOALS: Target date: 01/02/23  Pt will be indep to perform HEP for improving distal RUE flexibility, strength, and coordination. Baseline: Eval: Not yet initiated; 12/26/22: pt routinely works on resistive hand strengthening exercises with tools and putty at home, as well as stretching wrist and digits, and FMC activities.  Goal status: achieved  LONG TERM GOALS: Target date: 05/24/23  Pt will increase FOTO score to 64 or better  to indicate improvement in self perceived functional use of the R arm with daily tasks (revised on 03/01/23 from 56). Baseline: Eval: 50; 12/26/22: 49; 02/02/23: 50; 03/01/23: 59 Goal status: achieved/revised/ongoing  2.  Pt will increase R grip strength by 10 or more lbs to ease ability carry heavy grocery bags in R dominant hand. Baseline: Eval: R grip 26 lbs (non-dominant L 95 lbs); 12/26/22: R grip 33 lbs; 02/02/23: R grip 32 lbs; 03/01/23: R grip 30 lbs Goal status: ongoing  3.  Pt will increase R lateral pinch strength by 5 or more lbs to ease ability to open drink bottles. Baseline: Eval: R 7 lbs (non-dominant L 32 lbs); 12/26/22: R 8 lbs; 02/02/23: 4 lbs; 03/01/23: R 8 lbs Goal status: ongoing  4.  Pt will tolerate manual therapy, therapeutic modalities, and exercises to decrease pain in RUE to a reported 4/10 pain or less with activity.   Baseline: Eval: R forearm and hand 6-7/10 pain with activity; 12/26/22: Pt reports increased pain today after increased use of R hand over the weekend; still at 6-7/10 pain with activity; 02/02/23: 1/10 pain at rest, 3.5/10 pain with activity; 03/01/23:  2-3/10 pain in the RUE prior to seizure, today reporting 7/10 pain wide spread throughout the body and down RUE, with pt reporting that the last major seizure caused him to hurt for 3 weeks.   Goal status: ongoing  5.  Pt will increase R hand FMC/dexterity skills to improve efficiency with clothing fasteners as noted by completion of 9 hole peg test in <40 sec (revised on 12/26/22 from <2 min).  Baseline: Eval: 2 min 39 sec; extra time clothing fasteners, specifically buttons and tying shoe laces; 12/26/22: R 48 sec; 02/02/23: 51 sec; 03/01/23: R 46 sec Goal status: ongoing  ASSESSMENT:   CLINICAL IMPRESSION:   Pt seen this date for OT recertification visit with pt requesting to extend therapy visits.  Will need to submit for additional visit request.  Pt reported having a seizure 3 days ago, which has  contributed to increased pain throughout his body and down the RUE.  Prior to this seizure, pain was consistently improved in the RUE 2-4/10 pain most days  with activity.  Though minimal to no significant strength gains have been made in the R hand since start of therapy, FMC/dexterity skills continue to improve.  9 hole peg test at eval took 2 min and 39 sec in the R hand, while pt was able to perform in 46 sec with the R hand this date.  FOTO score has increased from 50 at eval to 59 at recert.  Pt estimates that he was engaging the RUE ~15% of the time with daily tasks before starting this round of therapy, and he now estimates using the RUE ~65% of the time with daily tasks.  Pt is scheduled to have an MRI for his cervical spine next Wednesday to identify if there have been any changes in his neck that may be preventing improvements in strength in his RUE.  Pt continues to report functional improvements with his R hand stating that he is now able to open a flip top soup can with the R hand.  Pt remains with significant weakness and paresthesias in the ulnar nerve distribution throughout the RUE.  Pt continues to present with difficulty repositioning small items within palm and fingertips d/t limited digit isolation in all digits.  Plan to decrease frequency to 1x per week for 12 additional weeks pending visit authorization in order to continue to target R hand FMC/dexterity skills.  Pt will continue to benefit from skilled OT to address RUE weakness, coordination deficits, stiffness, and pain in the RUE, in order to work towards increased functional use of the R arm for ADL/IADL/work tasks.    PERFORMANCE DEFICITS: in functional skills including ADLs, IADLs, coordination, dexterity, sensation, edema, ROM, strength, pain, fascial restrictions, muscle spasms, flexibility, Fine motor control, body mechanics, decreased knowledge of use of DME, skin integrity, and UE functional use, and psychosocial skills  including coping strategies, environmental adaptation, habits, and routines and behaviors.   IMPAIRMENTS: are limiting patient from ADLs, IADLs, rest and sleep, work, and leisure.   COMORBIDITIES: has co-morbidities such as hx of 6 cervical spinal surgeries, bipolar 1, schizophrenia  that affects occupational performance. Patient will benefit from skilled OT to address above impairments and improve overall function.  MODIFICATION OR ASSISTANCE TO COMPLETE EVALUATION: No modification of tasks or assist necessary to complete an evaluation.  OT OCCUPATIONAL PROFILE AND HISTORY: Problem focused assessment: Including review of records relating to presenting problem.  CLINICAL DECISION MAKING: Moderate - several treatment options, min-mod task modification necessary  REHAB POTENTIAL: Good  EVALUATION COMPLEXITY: Moderate      PLAN:  OT FREQUENCY: 1x/week  OT DURATION: 12 weeks  PLANNED INTERVENTIONS: self care/ADL training, therapeutic exercise, therapeutic activity, neuromuscular re-education, manual therapy, scar mobilization, passive range of motion, splinting, electrical stimulation, paraffin, moist heat, cryotherapy, contrast bath, patient/family education, cognitive remediation/compensation, psychosocial skills training, coping strategies training, and DME and/or AE instructions  RECOMMENDED OTHER SERVICES: None at this time  CONSULTED AND AGREED WITH PLAN OF CARE: Patient  PLAN FOR NEXT SESSION: see above  Danelle Earthly, MS, OTR/L  Otis Dials, OT 03/03/2023, 11:32 AM

## 2023-03-06 ENCOUNTER — Ambulatory Visit: Payer: MEDICAID | Admitting: Occupational Therapy
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
## 2023-03-08 ENCOUNTER — Ambulatory Visit: Payer: MEDICAID

## 2023-03-08 ENCOUNTER — Ambulatory Visit
Admission: RE | Admit: 2023-03-08 | Discharge: 2023-03-08 | Disposition: A | Discharge status: Home / Self Care | Payer: MEDICAID | Source: Ambulatory Visit | Attending: Neurosurgery | Admitting: Neurosurgery

## 2023-03-08 DIAGNOSIS — R262 Difficulty in walking, not elsewhere classified: Secondary | ICD-10-CM

## 2023-03-08 DIAGNOSIS — M542 Cervicalgia: Secondary | ICD-10-CM | POA: Insufficient documentation

## 2023-03-08 DIAGNOSIS — M6281 Muscle weakness (generalized): Secondary | ICD-10-CM

## 2023-03-08 DIAGNOSIS — R278 Other lack of coordination: Secondary | ICD-10-CM | POA: Diagnosis not present

## 2023-03-13 ENCOUNTER — Ambulatory Visit: Payer: MEDICAID
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
## 2023-03-15 ENCOUNTER — Ambulatory Visit: Payer: MEDICAID

## 2023-03-15 DIAGNOSIS — G5621 Lesion of ulnar nerve, right upper limb: Secondary | ICD-10-CM

## 2023-03-15 DIAGNOSIS — M25631 Stiffness of right wrist, not elsewhere classified: Secondary | ICD-10-CM

## 2023-03-15 DIAGNOSIS — M542 Cervicalgia: Secondary | ICD-10-CM

## 2023-03-15 DIAGNOSIS — M6281 Muscle weakness (generalized): Secondary | ICD-10-CM

## 2023-03-15 DIAGNOSIS — R262 Difficulty in walking, not elsewhere classified: Secondary | ICD-10-CM

## 2023-03-15 DIAGNOSIS — R278 Other lack of coordination: Secondary | ICD-10-CM | POA: Diagnosis not present

## 2023-03-15 DIAGNOSIS — M25641 Stiffness of right hand, not elsewhere classified: Secondary | ICD-10-CM

## 2023-03-16 NOTE — Therapy (Signed)
OUTPATIENT OCCUPATIONAL THERAPY ORTHO TREATMENT NOTE   Patient Name: Scott Howell MRN: [id number] DOB:Jan 16, 1971, 53 y.o., male Today's Date: 03/16/2023  PCP: Dr. Margarita Mail REFERRING PROVIDER: Dr. Ernestine Mcmurray   END OF SESSION:  OT End of Session - 03/16/23 2057     Visit Number 26    Number of Visits 37    Date for OT Re-Evaluation 05/24/23    Authorization Type approved for 1/22-7/21 for 12 OT visits    Progress Note Due on Visit 10    OT Start Time 1400    OT Stop Time 1445    OT Time Calculation (min) 45 min    Activity Tolerance Patient tolerated treatment well    Behavior During Therapy WFL for tasks assessed/performed            Past Medical History:  Diagnosis Date   Adenomatous polyp of colon    Arthritis    Asthma    Benign fibroma of prostate    Bipolar 1 disorder (HCC)    Cervical myelopathy (HCC)    Cervical post-laminectomy syndrome    Cervical spinal cord compression (HCC)    Chronic knee pain    Chronic pain associated with significant psychosocial dysfunction    Chronic prescription benzodiazepine use    Constipation    COPD (chronic obstructive pulmonary disease) (HCC)    Decreased motor strength    Drug abuse, opioid type (HCC)    Dyspnea    Elevated liver enzymes    Erectile dysfunction    Fibromyalgia    GERD (gastroesophageal reflux disease)    H/O ETOH abuse    Headache    High cholesterol    History of 2019 novel coronavirus disease (COVID-19) 11/01/2021   History of transient cerebral ischemia    Homicidal ideation    Hypertension    Hypokalemia    Living accommodation issues    a.) as of 03/29/2021 --> living in camper   Lower back pain    Lumbar canal stenosis    Marijuana use    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   Pneumonia    Presence of permanent cardiac pacemaker    S/P TKR (total knee replacement)    Schizophrenia (HCC)    Seizures (HCC) 11/2020   Status post hardware removal    Stroke (HCC)     no deficits   Tobacco use    Past Surgical History:  Procedure Laterality Date   ANTERIOR CERVICAL DECOMP/DISCECTOMY FUSION N/A 01/13/2021   Procedure: C3-5 ANTERIOR CERVICAL DECOMPRESSION/DISCECTOMY FUSION;  Surgeon: Venetia Night, MD;  Location: [facility];  Service: Neurosurgery;  Laterality: N/A;   CERVICAL SPINE SURGERY     4   CLAVICLE SURGERY     COLONOSCOPY WITH PROPOFOL N/A 06/29/2022   Procedure: COLONOSCOPY WITH PROPOFOL;  Surgeon: Wyline Mood, MD;  Location: [facility];  Service: Gastroenterology;  Laterality: N/A;  USES MED TRANS; WILL NEED TIME ON FRIDAY, 06/24/2022   HARDWARE REMOVAL Left 04/20/2015   Procedure: HARDWARE REMOVAL left leg;  Surgeon: Deeann Saint, MD;  Location: [facility];  Service: Orthopedics;  Laterality: Left;   KNEE SURGERY Left    NECK SURGERY     x6   SHOULDER SURGERY Left    TOTAL KNEE ARTHROPLASTY Left 04/12/2021   Procedure: TOTAL KNEE ARTHROPLASTY;  Surgeon: Lyndle Herrlich, MD;  Location: [facility];  Service: Orthopedics;  Laterality: Left;   ULNAR NERVE TRANSPOSITION Right 11/17/2021   Procedure: SUBCUTANEOUS TRANSPOSITION OF  ULNAR NERVE, RIGHT ELBOW;  Surgeon: Christena Flake, MD;  Location: [facility];  Service: Orthopedics;  Laterality: Right;   ULNAR NERVE TRANSPOSITION Right 10/04/2022   Procedure: ULNAR NERVE TRANSPOSITION;  Surgeon: Lovenia Kim, MD;  Location: [facility];  Service: Neurosurgery;  Laterality: Right;  NO BLOCK   Patient Active Problem List   Diagnosis Date Noted   Ulnar neuropathy of right upper extremity 10/05/2022   Hand weakness 10/05/2022   Nerve pain 10/05/2022   Adenomatous polyp of colon 06/29/2022   S/P TKR (total knee replacement) using cement, left 04/12/2021   Cervical myelopathy (HCC) 01/13/2021   Seizure (HCC) 11/26/2020   Abnormal thyroid blood test 04/06/2016   Encounter for screening colonoscopy 01/05/2016   Abnormal weight gain 01/05/2016   Chronic prescription benzodiazepine use 10/19/2015    Elevated liver enzymes 08/20/2015   Medication monitoring encounter 08/20/2015   Hypokalemia 08/20/2015   Erectile dysfunction 04/22/2015   Status post hardware removal 04/20/2015   Bipolar 2 disorder (HCC) 07/29/2014   Marijuana abuse 07/29/2014   Chronic knee pain 07/29/2014   Bipolar 1 disorder, mixed (HCC)    Drug abuse, opioid type (HCC) 06/28/2013   GERD without esophagitis 10/03/2012   Hypertension goal BP (blood pressure) < 140/90 10/03/2012   Chronic pain associated with significant psychosocial dysfunction 07/17/2012   Lumbar canal stenosis 04/09/2012   Hypercholesterolemia 01/06/2012   Constipation 11/11/2011   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/2010   Decreased motor strength 09/30/2010   Current tobacco use 09/30/2010   H/O transient cerebral ischemia 09/09/2010   Low back pain 08/30/2010   Cervical spinal cord compression (HCC) 07/22/2010   Cervical post-laminectomy syndrome 07/22/2010   Chronic obstructive pulmonary disease (HCC) 02/23/2010   ONSET DATE: 10/04/22 (most recent sx)  REFERRING DIAG: G56.21 (ICD-10-CM) - Ulnar neuropathy of right upper extremity   THERAPY DIAG:  Muscle weakness (generalized)  Other lack of coordination  Stiffness of right hand, not elsewhere classified  Ulnar neuropathy at elbow of right upper extremity  Stiffness of right wrist, not elsewhere classified  Rationale for Evaluation and Treatment: Rehabilitation  SUBJECTIVE:  SUBJECTIVE STATEMENT: Pt reports he hasn't gotten the results reviewed with him yet for his neck CT, but he plans to call the MD tomorrow for follow up. Pt accompanied by: self  PERTINENT HISTORY:  Per note from Dr. Katrinka Blazing on 11/16/22: Scott Howell is 2 weeks status post Ulnar nerve decompression, AIN to ulnar nerve transfer, and ulnar nerve peripheral nerve stimulator placement. Given oxycodone on discharge from the hospital.   Per medical record, pt underwent first ulnar nerve transposition on 11/17/21  and participated in OT post surgery.  2nd ulnar nerve transposition done on 10/04/22.  Pt had nerve stimulator placed but pt reports he does not like how it feels so he does not turn it on.  Pt also with hx of 6 cervical spinal surgeries; see above for additional hx.    PRECAUTIONS: None  RED FLAGS: None   WEIGHT BEARING RESTRICTIONS: No  PAIN: 03/15/23: 2/10 pain RUE (pt reports the warmer weather helps to reduce his pain) Are you having pain? Yes: NPRS scale: 2/10 at rest, with activity 6-7/10 Pain location: ulnar side of R arm extending from elbow to hand Pain description: pins and needles, occasional spasms, numbness Aggravating factors: "my arm doesn't like the cold, increased activity  Relieving factors: heat, rest, Gabapentin  LIVING ENVIRONMENT: Lives with: alone with 1 dog  PLOF: Independent, works as a Scientist, water quality  PATIENT GOALS: build up the strength in the R arm and hand  NEXT MD VISIT:  OBJECTIVE:  Note: Objective measures were completed at Evaluation unless otherwise noted.  HAND DOMINANCE: Right  ADLs: Overall ADLs: Pt reports he always tries to use the R arm as able, but in a limited capacity, and is unable to use the R hand with good accuracy or efficiency. Transfers/ambulation related to ADLs: indep Eating: gross grasp in R hand to hold utensils, difficulty cutting food  Grooming: uses L non-dominant hand to shave  UB Dressing: difficulty with clothing fasteners LB Dressing: difficulty with clothing fasteners, extra time to tie shoe laces  Toileting: uses L non-dominant hand for toilet hygiene Bathing: Pt states that he makes himself use the R hand when bathing but control is limited  FUNCTIONAL OUTCOME MEASURES: FOTO: 50; predicted with 56 12/26/22: FOTO: 49 02/02/23: FOTO: 50 03/01/23: FOTO: 59   UPPER EXTREMITY ROM:     Active ROM Left eval Right eval Right 12/26/22 Right 02/02/23 Right 03/01/23  Shoulder flexion       Shoulder abduction        Shoulder adduction       Shoulder extension       Shoulder internal rotation       Shoulder external rotation       Elbow flexion       Elbow extension       Wrist flexion       Wrist extension 65 58 60 60 60 (Mps flexed)  Wrist ulnar deviation WNL -30 -30 -30 ( able to achieve neutral wrist with gravity assisted) -25  Wrist radial deviation WNL WNL WNL WNL   Wrist pronation 90 90 90 90 90  Wrist supination 75 70 75 80 80  (Blank rows = not tested)  Active ROM Right eval Left eval  Thumb MCP (0-60)    Thumb IP (0-80)    Thumb Radial abd/add (0-55)     Thumb Palmar abd/add (0-45)     Thumb Opposition to Small Finger     Index MCP (0-90)     Index PIP (0-100)     Index DIP (0-70)      Long MCP (0-90)      Long PIP (0-100)      Long DIP (0-70)      Ring MCP (0-90)      Ring PIP (0-100)      Ring DIP (0-70)      Little MCP (0-90)      Little PIP (0-100)      Little DIP (0-70)      (Blank rows = not tested)  Eval: R hand digits limited in PIP extension in 4th and 5th digits with neutral wrist (lacking ~25% ext in 4th digit PIP and 50% ext in 5th digit PIP) 12/26/22: R hand lacking ~10% ext in 4th digit PIP and lacking ~30% ext in 5th digit PIP 02/02/23: R hand lacking ~10% ext in 4th digit PIP and lacking ~30% ext in 5th digit PIP 03/01/23: R hand lacking ~ R finger lacking about 25% ext, 5th digit lacking ~50% ext (feels bad today)  Eval: -limited active digit abd in 4th and 5th digits 12/26/22: able to achieve slight digit abduction in 4th and 5th digits when compensating with wrist and MP flexion  02/02/23: Same as 12/26/22; fingers can abd on table top but by radially deviating  03/01/23: Pt can achieve minimal abduction of 4th and 5th digits if wrist is  slightly flexed    Eval: -Unable to oppose R 4th and 5th digits to thumb  12/26/22: unable to oppose R 4th and 5th digits to thumb 02/02/23: able to oppose R 4th and 5th digit to thumb, but with increased  effort 03/01/23: able to oppose R 4th digit to thumb, able to manage 5th digit if stabilizing digits 2-4 in extension   UPPER EXTREMITY MMT:     MMT Left eval Right eval Right 12/26/22 Right 03/01/23  Shoulder flexion      Shoulder abduction      Shoulder adduction      Shoulder extension      Shoulder internal rotation      Shoulder external rotation      Middle trapezius      Lower trapezius      Elbow flexion 5 5    Elbow extension 5 5    Wrist flexion 5 4 4+ 4+  Wrist extension 5 4+ (simultaneous radial dev) 4+ (slight radial dev) 4+ (slight radial dev)  Wrist ulnar deviation 5 1  2   Wrist radial deviation 5 5  5   Wrist pronation 5 5  5   Wrist supination 5 5  5   (Blank rows = not tested)  HAND FUNCTION: Grip strength: Right: 26 lbs; Left: 95 lbs, Lateral pinch: Right: 7 lbs, Left: 32 lbs, and 3 point pinch: Right: 4 lbs, Left: 27 lbs 12/12/22: R grip 35 lbs; R lateral pinch: 8 lbs: 3 point pinch: 4 lbs  12/26/22: R grip 33 lbs; R lateral pinch: 8 lbs; 3 point pinch: 4 lbs (Pt reports increased fatigue in R hand today following extensive use of the hand over the weekend when using a tool) 02/02/23: R grip 32 lbs; R lateral pinch: 4 lbs, 3 point pinch: 4 lbs (Pt reports using tools and shovels extensively over the last 2 days, possibly limited by muscle fatigue from overuse) 03/01/23: R grip 30 lbs (more painful today); L grip 96 lbs; R lateral pinch: R 8 lbs, L 27 lbs; 3 point pinch: R 4 lbs,  L 19 lbs  COORDINATION: 9 Hole Peg test: Right: 2 min 39 sec; Left: 26 sec 12/26/22: R 48 secs 02/02/23: R 51 sec  03/01/23: R 46 sec   SENSATION: Light touch: Impaired ; ulnar nerve distribution  EDEMA: very mild edema surrounding incision sites on R forearm and elbow   COGNITION: Overall cognitive status: Within functional limits for tasks assessed Areas of impairment:  hx of bipolar 1, schizophrenia, and drug and alcohol abuse  OBSERVATIONS:  Pt pleasant, cooperative, and  eager to regain strength in his R dominant arm.  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   Note for education/handouts: Pt reports he is limited with his reading and writing ability, but states he will always ask if he doesn't understand something.  TODAY'S TREATMENT:  DATE: 03/15/23 Therapeutic Exercise: -Facilitated RUE flexibility exercises: -Completed passive stretching for R wrist and digit ext, ulnar deviation, R forearm sup -Performed slow, passive proximal ulnar nerve glides x3, OT assisted to achieve max end range stretch with arm abducted to 90 degrees, and elbow, wrist, and digits extended, both with forearm in the supinated and pronated positions.   -Completed AAROM for R wrist and digits, including digit abd/add and digit flex/ext.  Therapeutic Activity: -Pt worked on R hand digit opposition, 2 point, and 3 point pinching working to pick up [place] stones and move stones between dish and table top.  -Facilitated high reps of active supination and digit flexion/ext with OT placing 1 stone into supinated palm.  Pt then performed reps of closing hand around stone, pronating forearm without dropping stone, and supinating and extending digits again for OT to remove stone from hand.  Neuro re-ed: Facilitated R hand FMC/dexterity skills working to pick up and place grooved pegs into pegboard using R hand.  Initial min vc to minimize compensation for isolating hand from forearm.  PATIENT EDUCATION: Education details: R hand FMC/dexterity skills Person educated: Patient Education method: Explanation Education comprehension: verbalized understanding, demonstrated understanding with vc and tactile cues  HOME EXERCISE PROGRAM: Yellow theraputty, wrist/forearm strengthening with 2# dumbbell, ulnar nerve  glides  GOALS: Goals reviewed with patient? Yes  SHORT TERM GOALS: Target date: 01/02/23  Pt will be indep to perform HEP for improving distal RUE flexibility, strength, and coordination. Baseline: Eval: Not yet initiated; 12/26/22: pt routinely works on resistive hand strengthening exercises with tools and putty at home, as well as stretching wrist and digits, and FMC activities.  Goal status: achieved  LONG TERM GOALS: Target date: 05/24/23  Pt will increase FOTO score to 64 or better to indicate improvement in self perceived functional use of the R arm with daily tasks (revised on 03/01/23 from 56). Baseline: Eval: 50; 12/26/22: 49; 02/02/23: 50; 03/01/23: 59 Goal status: achieved/revised/ongoing  2.  Pt will increase R grip strength by 10 or more lbs to ease ability carry heavy grocery bags in R dominant hand. Baseline: Eval: R grip 26 lbs (non-dominant L 95 lbs); 12/26/22: R grip 33 lbs; 02/02/23: R grip 32 lbs; 03/01/23: R grip 30 lbs Goal status: ongoing  3.  Pt will increase R lateral pinch strength by 5 or more lbs to ease ability to open drink bottles. Baseline: Eval: R 7 lbs (non-dominant L 32 lbs); 12/26/22: R 8 lbs; 02/02/23: 4 lbs; 03/01/23: R 8 lbs Goal status: ongoing  4.  Pt will tolerate manual therapy, therapeutic modalities, and exercises to decrease pain in RUE to a reported 4/10 pain or less with activity.   Baseline: Eval: R forearm and hand 6-7/10 pain with activity; 12/26/22: Pt reports increased pain today after increased use of R hand over the weekend; still at 6-7/10 pain with activity; 02/02/23: 1/10 pain at rest, 3.5/10 pain with activity; 03/01/23:  2-3/10 pain in the RUE prior to seizure, today reporting 7/10 pain wide spread throughout the body and down RUE, with pt reporting that the last major seizure caused him to hurt for 3 weeks.   Goal status: ongoing  5.  Pt will increase R hand FMC/dexterity skills to improve efficiency with clothing fasteners as noted  by completion of 9 hole peg test in <40 sec (revised on 12/26/22 from <2 min).  Baseline: Eval: 2 min 39 sec; extra time clothing fasteners, specifically buttons and tying shoe laces; 12/26/22: R 48  sec; 02/02/23: 51 sec; 03/01/23: R 46 sec Goal status: ongoing  ASSESSMENT:   CLINICAL IMPRESSION:   Pt had cervical spinal CT last week but has not yet had results reviewed.  Low pain levels today in RUE, with pt reporting that the warmer weather this week always helps with reducing his pain.  Good tolerance to above noted exercises and neuro re-ed activities this date.  Pt continues to be challenged with digit isolation, translatory skills, and item storage in the R hand as a result of ulnar nerve trauma.  Pt will continue to benefit from skilled OT to address RUE weakness, coordination deficits, stiffness, and pain in the RUE, in order to work towards increased functional use of the R arm for ADL/IADL/work tasks.    PERFORMANCE DEFICITS: in functional skills including ADLs, IADLs, coordination, dexterity, sensation, edema, ROM, strength, pain, fascial restrictions, muscle spasms, flexibility, Fine motor control, body mechanics, decreased knowledge of use of DME, skin integrity, and UE functional use, and psychosocial skills including coping strategies, environmental adaptation, habits, and routines and behaviors.   IMPAIRMENTS: are limiting patient from ADLs, IADLs, rest and sleep, work, and leisure.   COMORBIDITIES: has co-morbidities such as hx of 6 cervical spinal surgeries, bipolar 1, schizophrenia  that affects occupational performance. Patient will benefit from skilled OT to address above impairments and improve overall function.  MODIFICATION OR ASSISTANCE TO COMPLETE EVALUATION: No modification of tasks or assist necessary to complete an evaluation.  OT OCCUPATIONAL PROFILE AND HISTORY: Problem focused assessment: Including review of records relating to presenting problem.  CLINICAL DECISION  MAKING: Moderate - several treatment options, min-mod task modification necessary  REHAB POTENTIAL: Good  EVALUATION COMPLEXITY: Moderate      PLAN:  OT FREQUENCY: 1x/week  OT DURATION: 12 weeks  PLANNED INTERVENTIONS: self care/ADL training, therapeutic exercise, therapeutic activity, neuromuscular re-education, manual therapy, scar mobilization, passive range of motion, splinting, electrical stimulation, paraffin, moist heat, cryotherapy, contrast bath, patient/family education, cognitive remediation/compensation, psychosocial skills training, coping strategies training, and DME and/or AE instructions  RECOMMENDED OTHER SERVICES: None at this time  CONSULTED AND AGREED WITH PLAN OF CARE: Patient  PLAN FOR NEXT SESSION: see above  Danelle Earthly, MS, OTR/L  Otis Dials, OT 03/16/2023, 9:02 PM

## 2023-03-20 ENCOUNTER — Ambulatory Visit: Payer: MEDICAID
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
## 2023-03-22 ENCOUNTER — Ambulatory Visit: Payer: MEDICAID

## 2023-03-22 ENCOUNTER — Ambulatory Visit: Payer: MEDICAID | Attending: Neurosurgery

## 2023-03-22 DIAGNOSIS — M542 Cervicalgia: Secondary | ICD-10-CM | POA: Diagnosis present

## 2023-03-22 DIAGNOSIS — M6281 Muscle weakness (generalized): Secondary | ICD-10-CM

## 2023-03-22 DIAGNOSIS — G5621 Lesion of ulnar nerve, right upper limb: Secondary | ICD-10-CM | POA: Diagnosis present

## 2023-03-22 DIAGNOSIS — M25631 Stiffness of right wrist, not elsewhere classified: Secondary | ICD-10-CM | POA: Diagnosis present

## 2023-03-22 DIAGNOSIS — R278 Other lack of coordination: Secondary | ICD-10-CM | POA: Diagnosis present

## 2023-03-22 DIAGNOSIS — R262 Difficulty in walking, not elsewhere classified: Secondary | ICD-10-CM

## 2023-03-22 DIAGNOSIS — M25641 Stiffness of right hand, not elsewhere classified: Secondary | ICD-10-CM | POA: Diagnosis present

## 2023-03-22 DIAGNOSIS — R2681 Unsteadiness on feet: Secondary | ICD-10-CM | POA: Diagnosis present

## 2023-03-22 NOTE — Therapy (Signed)
 OUTPATIENT OCCUPATIONAL THERAPY ORTHO TREATMENT NOTE   Patient Name: Scott Howell MRN: [id number] DOB:November 27, 1970, 53 y.o., male Today's Date: 03/22/2023  PCP: Dr. Sharyle Howell REFERRING PROVIDER: Dr. Penne Howell   END OF SESSION:  OT End of Session - 03/22/23 1325     Visit Number 27    Number of Visits 37    Date for OT Re-Evaluation 05/24/23    Authorization Type approved for 1/22-7/21 for 12 OT visits    Authorization Time Period Progress reporting period beginning 03/01/23    Progress Note Due on Visit 10    OT Start Time 1315    OT Stop Time 1400    OT Time Calculation (min) 45 min    Activity Tolerance Patient tolerated treatment well    Behavior During Therapy WFL for tasks assessed/performed            Past Medical History:  Diagnosis Date   Adenomatous polyp of colon    Arthritis    Asthma    Benign fibroma of prostate    Bipolar 1 disorder (HCC)    Cervical myelopathy (HCC)    Cervical post-laminectomy syndrome    Cervical spinal cord compression (HCC)    Chronic knee pain    Chronic pain associated with significant psychosocial dysfunction    Chronic prescription benzodiazepine use    Constipation    COPD (chronic obstructive pulmonary disease) (HCC)    Decreased motor strength    Drug abuse, opioid type (HCC)    Dyspnea    Elevated liver enzymes    Erectile dysfunction    Fibromyalgia    GERD (gastroesophageal reflux disease)    H/O ETOH abuse    Headache    High cholesterol    History of 2019 novel coronavirus disease (COVID-19) 11/01/2021   History of transient cerebral ischemia    Homicidal ideation    Hypertension    Hypokalemia    Living accommodation issues    a.) as of 03/29/2021 --> living in camper   Lower back pain    Lumbar canal stenosis    Marijuana use    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   Pneumonia    Presence of permanent cardiac pacemaker    S/P TKR (total knee replacement)    Schizophrenia (HCC)     Seizures (HCC) 11/2020   Status post hardware removal    Stroke (HCC)    no deficits   Tobacco use    Past Surgical History:  Procedure Laterality Date   ANTERIOR CERVICAL DECOMP/DISCECTOMY FUSION N/A 01/13/2021   Procedure: C3-5 ANTERIOR CERVICAL DECOMPRESSION/DISCECTOMY FUSION;  Surgeon: Scott Fret, MD;  Location: [facility];  Service: Neurosurgery;  Laterality: N/A;   CERVICAL SPINE SURGERY     4   CLAVICLE SURGERY     COLONOSCOPY WITH PROPOFOL  N/A 06/29/2022   Procedure: COLONOSCOPY WITH PROPOFOL ;  Surgeon: Scott Bi, MD;  Location: [facility] ENDOSCOPY;  Service: Gastroenterology;  Laterality: N/A;  USES MED TRANS; WILL NEED TIME ON FRIDAY, 06/24/2022   HARDWARE REMOVAL Left 04/20/2015   Procedure: HARDWARE REMOVAL left leg;  Surgeon: Scott Pinal, MD;  Location: [facility];  Service: Orthopedics;  Laterality: Left;   KNEE SURGERY Left    NECK SURGERY     x6   SHOULDER SURGERY Left    TOTAL KNEE ARTHROPLASTY Left 04/12/2021   Procedure: TOTAL KNEE ARTHROPLASTY;  Surgeon: Scott Lynwood SAUNDERS, MD;  Location: [facility];  Service: Orthopedics;  Laterality: Left;  ULNAR NERVE TRANSPOSITION Right 11/17/2021   Procedure: SUBCUTANEOUS TRANSPOSITION OF ULNAR NERVE, RIGHT ELBOW;  Surgeon: Scott Norleen PARAS, MD;  Location: [facility];  Service: Orthopedics;  Laterality: Right;   ULNAR NERVE TRANSPOSITION Right 10/04/2022   Procedure: ULNAR NERVE TRANSPOSITION;  Surgeon: Scott Scott ORN, MD;  Location: [facility];  Service: Neurosurgery;  Laterality: Right;  NO BLOCK   Patient Active Problem List   Diagnosis Date Noted   Ulnar neuropathy of right upper extremity 10/05/2022   Hand weakness 10/05/2022   Nerve pain 10/05/2022   Adenomatous polyp of colon 06/29/2022   S/P TKR (total knee replacement) using cement, left 04/12/2021   Cervical myelopathy (HCC) 01/13/2021   Seizure (HCC) 11/26/2020   Abnormal thyroid blood test 04/06/2016   Encounter for screening colonoscopy 01/05/2016   Abnormal  weight gain 01/05/2016   Chronic prescription benzodiazepine use 10/19/2015   Elevated liver enzymes 08/20/2015   Medication monitoring encounter 08/20/2015   Hypokalemia 08/20/2015   Erectile dysfunction 04/22/2015   Status post hardware removal 04/20/2015   Bipolar 2 disorder (HCC) 07/29/2014   Marijuana abuse 07/29/2014   Chronic knee pain 07/29/2014   Bipolar 1 disorder, mixed (HCC)    Drug abuse, opioid type (HCC) 06/28/2013   GERD without esophagitis 10/03/2012   Hypertension goal BP (blood pressure) < 140/90 10/03/2012   Chronic pain associated with significant psychosocial dysfunction 07/17/2012   Lumbar canal stenosis 04/09/2012   Hypercholesterolemia 01/06/2012   Constipation 11/11/2011   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/2010   Decreased motor strength 09/30/2010   Current tobacco use 09/30/2010   H/O transient cerebral ischemia 09/09/2010   Low back pain 08/30/2010   Cervical spinal cord compression (HCC) 07/22/2010   Cervical post-laminectomy syndrome 07/22/2010   Chronic obstructive pulmonary disease (HCC) 02/23/2010   ONSET DATE: 10/04/22 (most recent sx)  REFERRING DIAG: G56.21 (ICD-10-CM) - Ulnar neuropathy of right upper extremity   THERAPY DIAG:  Muscle weakness (generalized)  Other lack of coordination  Stiffness of right hand, not elsewhere classified  Ulnar neuropathy at elbow of right upper extremity  Stiffness of right wrist, not elsewhere classified  Rationale for Evaluation and Treatment: Rehabilitation  SUBJECTIVE:  SUBJECTIVE STATEMENT: Pt reports he has been using the R arm to reach and carry most things and finds himself no longer immediately going to the L arm for compensation, though he reports he does pay for using the R arm, in regards to increased pain with use. Pt accompanied by: self  PERTINENT HISTORY:  Per note from Dr. Claudene on 11/16/22: Scott Howell is 2 weeks status post Ulnar nerve decompression, AIN to ulnar nerve  transfer, and ulnar nerve peripheral nerve stimulator placement. Given oxycodone  on discharge from the hospital.   Per medical record, pt underwent first ulnar nerve transposition on 11/17/21 and participated in OT post surgery.  2nd ulnar nerve transposition done on 10/04/22.  Pt had nerve stimulator placed but pt reports he does not like how it feels so he does not turn it on.  Pt also with hx of 6 cervical spinal surgeries; see above for additional hx.    PRECAUTIONS: None  RED FLAGS: None   WEIGHT BEARING RESTRICTIONS: No  PAIN: 03/22/23: 4/10 pain RUE at rest, 6-7/10 pain with activity Are you having pain? Yes: NPRS scale: 2/10 at rest, with activity 6-7/10 Pain location: ulnar side of R arm extending from elbow to hand Pain description: pins and needles, occasional spasms, numbness Aggravating factors: my arm doesn't like the  cold, increased activity  Relieving factors: heat, rest, Gabapentin   LIVING ENVIRONMENT: Lives with: alone with 1 dog  PLOF: Independent, works as a scientist, water quality  PATIENT GOALS: build up the strength in the R arm and hand  NEXT MD VISIT:  OBJECTIVE:  Note: Objective measures were completed at Evaluation unless otherwise noted.  HAND DOMINANCE: Right  ADLs: Overall ADLs: Pt reports he always tries to use the R arm as able, but in a limited capacity, and is unable to use the R hand with good accuracy or efficiency. Transfers/ambulation related to ADLs: indep Eating: gross grasp in R hand to hold utensils, difficulty cutting food  Grooming: uses L non-dominant hand to shave  UB Dressing: difficulty with clothing fasteners LB Dressing: difficulty with clothing fasteners, extra time to tie shoe laces  Toileting: uses L non-dominant hand for toilet hygiene Bathing: Pt states that he makes himself use the R hand when bathing but control is limited  FUNCTIONAL OUTCOME MEASURES: FOTO: 50; predicted with 56 12/26/22: FOTO: 49 02/02/23: FOTO: 50 03/01/23:  FOTO: 59   UPPER EXTREMITY ROM:     Active ROM Left eval Right eval Right 12/26/22 Right 02/02/23 Right 03/01/23  Shoulder flexion       Shoulder abduction       Shoulder adduction       Shoulder extension       Shoulder internal rotation       Shoulder external rotation       Elbow flexion       Elbow extension       Wrist flexion       Wrist extension 65 58 60 60 60 (Mps flexed)  Wrist ulnar deviation WNL -30 -30 -30 ( able to achieve neutral wrist with gravity assisted) -25  Wrist radial deviation WNL WNL WNL WNL   Wrist pronation 90 90 90 90 90  Wrist supination 75 70 75 80 80  (Blank rows = not tested)  Active ROM Right eval Left eval  Thumb MCP (0-60)    Thumb IP (0-80)    Thumb Radial abd/add (0-55)     Thumb Palmar abd/add (0-45)     Thumb Opposition to Small Finger     Index MCP (0-90)     Index PIP (0-100)     Index DIP (0-70)      Long MCP (0-90)      Long PIP (0-100)      Long DIP (0-70)      Ring MCP (0-90)      Ring PIP (0-100)      Ring DIP (0-70)      Little MCP (0-90)      Little PIP (0-100)      Little DIP (0-70)      (Blank rows = not tested)  Eval: R hand digits limited in PIP extension in 4th and 5th digits with neutral wrist (lacking ~25% ext in 4th digit PIP and 50% ext in 5th digit PIP) 12/26/22: R hand lacking ~10% ext in 4th digit PIP and lacking ~30% ext in 5th digit PIP 02/02/23: R hand lacking ~10% ext in 4th digit PIP and lacking ~30% ext in 5th digit PIP 03/01/23: R hand lacking ~ R finger lacking about 25% ext, 5th digit lacking ~50% ext (feels bad today)  Eval: -limited active digit abd in 4th and 5th digits 12/26/22: able to achieve slight digit abduction in 4th and 5th digits when compensating with wrist and MP flexion  02/02/23: Same  as 12/26/22; fingers can abd on table top but by radially deviating  03/01/23: Pt can achieve minimal abduction of 4th and 5th digits if wrist is slightly flexed    Eval: -Unable to oppose R 4th  and 5th digits to thumb  12/26/22: unable to oppose R 4th and 5th digits to thumb 02/02/23: able to oppose R 4th and 5th digit to thumb, but with increased effort 03/01/23: able to oppose R 4th digit to thumb, able to manage 5th digit if stabilizing digits 2-4 in extension   UPPER EXTREMITY MMT:     MMT Left eval Right eval Right 12/26/22 Right 03/01/23  Shoulder flexion      Shoulder abduction      Shoulder adduction      Shoulder extension      Shoulder internal rotation      Shoulder external rotation      Middle trapezius      Lower trapezius      Elbow flexion 5 5    Elbow extension 5 5    Wrist flexion 5 4 4+ 4+  Wrist extension 5 4+ (simultaneous radial dev) 4+ (slight radial dev) 4+ (slight radial dev)  Wrist ulnar deviation 5 1  2   Wrist radial deviation 5 5  5   Wrist pronation 5 5  5   Wrist supination 5 5  5   (Blank rows = not tested)  HAND FUNCTION: Grip strength: Right: 26 lbs; Left: 95 lbs, Lateral pinch: Right: 7 lbs, Left: 32 lbs, and 3 point pinch: Right: 4 lbs, Left: 27 lbs 12/12/22: R grip 35 lbs; R lateral pinch: 8 lbs: 3 point pinch: 4 lbs  12/26/22: R grip 33 lbs; R lateral pinch: 8 lbs; 3 point pinch: 4 lbs (Pt reports increased fatigue in R hand today following extensive use of the hand over the weekend when using a tool) 02/02/23: R grip 32 lbs; R lateral pinch: 4 lbs, 3 point pinch: 4 lbs (Pt reports using tools and shovels extensively over the last 2 days, possibly limited by muscle fatigue from overuse) 03/01/23: R grip 30 lbs (more painful today); L grip 96 lbs; R lateral pinch: R 8 lbs, L 27 lbs; 3 point pinch: R 4 lbs,  L 19 lbs  COORDINATION: 9 Hole Peg test: Right: 2 min 39 sec; Left: 26 sec 12/26/22: R 48 secs 02/02/23: R 51 sec  03/01/23: R 46 sec   SENSATION: Light touch: Impaired ; ulnar nerve distribution  EDEMA: very mild edema surrounding incision sites on R forearm and elbow   COGNITION: Overall cognitive status: Within functional  limits for tasks assessed Areas of impairment:  hx of bipolar 1, schizophrenia, and drug and alcohol abuse  OBSERVATIONS:  Pt pleasant, cooperative, and eager to regain strength in his R dominant arm.  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   Note for education/handouts: Pt reports he is limited with his reading and writing ability, but states he will always ask if he doesn't understand something.  TODAY'S TREATMENT:  DATE: 03/22/23 Therapeutic Exercise: -Facilitated RUE flexibility exercises: -Completed passive stretching for R wrist and digit ext, ulnar deviation, R forearm sup  Therapeutic Activity: Facilitated R hand FMC/dexterity skills as follows: -small peg placement into pegboard; cues for engaging R LF with thumb and IF during pick up; scooping and storing multiple into palm, digit ext to release  pegs from hand -washer pick up from table top and magnetic dish, storage and translatory skills to move washer from palm to vertical dowel  Manual Therapy: Soft tissue massage to R medial elbow, triceps tendon, and ulnar nerve distribution just proximal and distal to elbow, working to increase circulation, decrease pain, increase tissue extensibility   PATIENT EDUCATION: Education details: potential benefits of compression sleeve to R elbow with pad insertion (reduce pain/tenderness) Person educated: Patient Education method: Explanation Education comprehension: verbalized understanding, receptive, wants to see what MD says about his CT scan tomorrow  HOME EXERCISE PROGRAM: Yellow theraputty, wrist/forearm strengthening with 2# dumbbell, ulnar nerve glides  GOALS: Goals reviewed with patient? Yes  SHORT TERM GOALS: Target date: 01/02/23  Pt will be indep to perform HEP for improving distal RUE flexibility,  strength, and coordination. Baseline: Eval: Not yet initiated; 12/26/22: pt routinely works on resistive hand strengthening exercises with tools and putty at home, as well as stretching wrist and digits, and FMC activities.  Goal status: achieved  LONG TERM GOALS: Target date: 05/24/23  Pt will increase FOTO score to 64 or better to indicate improvement in self perceived functional use of the R arm with daily tasks (revised on 03/01/23 from 56). Baseline: Eval: 50; 12/26/22: 49; 02/02/23: 50; 03/01/23: 59 Goal status: achieved/revised/ongoing  2.  Pt will increase R grip strength by 10 or more lbs to ease ability carry heavy grocery bags in R dominant hand. Baseline: Eval: R grip 26 lbs (non-dominant L 95 lbs); 12/26/22: R grip 33 lbs; 02/02/23: R grip 32 lbs; 03/01/23: R grip 30 lbs Goal status: ongoing  3.  Pt will increase R lateral pinch strength by 5 or more lbs to ease ability to open drink bottles. Baseline: Eval: R 7 lbs (non-dominant L 32 lbs); 12/26/22: R 8 lbs; 02/02/23: 4 lbs; 03/01/23: R 8 lbs Goal status: ongoing  4.  Pt will tolerate manual therapy, therapeutic modalities, and exercises to decrease pain in RUE to a reported 4/10 pain or less with activity.   Baseline: Eval: R forearm and hand 6-7/10 pain with activity; 12/26/22: Pt reports increased pain today after increased use of R hand over the weekend; still at 6-7/10 pain with activity; 02/02/23: 1/10 pain at rest, 3.5/10 pain with activity; 03/01/23:  2-3/10 pain in the RUE prior to seizure, today reporting 7/10 pain wide spread throughout the body and down RUE, with pt reporting that the last major seizure caused him to hurt for 3 weeks.   Goal status: ongoing  5.  Pt will increase R hand FMC/dexterity skills to improve efficiency with clothing fasteners as noted by completion of 9 hole peg test in <40 sec (revised on 12/26/22 from <2 min).  Baseline: Eval: 2 min 39 sec; extra time clothing fasteners, specifically buttons and  tying shoe laces; 12/26/22: R 48 sec; 02/02/23: 51 sec; 03/01/23: R 46 sec Goal status: ongoing  ASSESSMENT:   CLINICAL IMPRESSION:   Fairly good tolerance to manual therapy this date, though pt remains tender and has to talk through massage for distraction.  Pt receptive to compression sleeve for elbow but wants to wait to see  what MD says tomorrow after reviewing his cervical CT scan.  Pt continues to require frequent rest breaks with FM tasks as he experiences cramping with high effort activities that attempt to engage all R hand digits.  Pt will continue to benefit from skilled OT to address RUE weakness, coordination deficits, stiffness, and pain in the RUE, in order to work towards increased functional use of the R arm for ADL/IADL/work tasks.    PERFORMANCE DEFICITS: in functional skills including ADLs, IADLs, coordination, dexterity, sensation, edema, ROM, strength, pain, fascial restrictions, muscle spasms, flexibility, Fine motor control, body mechanics, decreased knowledge of use of DME, skin integrity, and UE functional use, and psychosocial skills including coping strategies, environmental adaptation, habits, and routines and behaviors.   IMPAIRMENTS: are limiting patient from ADLs, IADLs, rest and sleep, work, and leisure.   COMORBIDITIES: has co-morbidities such as hx of 6 cervical spinal surgeries, bipolar 1, schizophrenia  that affects occupational performance. Patient will benefit from skilled OT to address above impairments and improve overall function.  MODIFICATION OR ASSISTANCE TO COMPLETE EVALUATION: No modification of tasks or assist necessary to complete an evaluation.  OT OCCUPATIONAL PROFILE AND HISTORY: Problem focused assessment: Including review of records relating to presenting problem.  CLINICAL DECISION MAKING: Moderate - several treatment options, min-mod task modification necessary  REHAB POTENTIAL: Good  EVALUATION COMPLEXITY: Moderate      PLAN:  OT  FREQUENCY: 1x/week  OT DURATION: 12 weeks  PLANNED INTERVENTIONS: self care/ADL training, therapeutic exercise, therapeutic activity, neuromuscular re-education, manual therapy, scar mobilization, passive range of motion, splinting, electrical stimulation, paraffin, moist heat, cryotherapy, contrast bath, patient/family education, cognitive remediation/compensation, psychosocial skills training, coping strategies training, and DME and/or AE instructions  RECOMMENDED OTHER SERVICES: None at this time  CONSULTED AND AGREED WITH PLAN OF CARE: Patient  PLAN FOR NEXT SESSION: see above  Inocente Blazing, MS, OTR/L  Inocente MARLA Blazing, OT 03/22/2023, 3:19 PM

## 2023-03-23 ENCOUNTER — Ambulatory Visit: Payer: MEDICAID | Admitting: Neurosurgery

## 2023-03-23 VITALS — BP 130/78 | Ht 73.0 in | Wt 241.0 lb

## 2023-03-23 DIAGNOSIS — M542 Cervicalgia: Secondary | ICD-10-CM

## 2023-03-23 DIAGNOSIS — M960 Pseudarthrosis after fusion or arthrodesis: Secondary | ICD-10-CM

## 2023-03-23 DIAGNOSIS — M4802 Spinal stenosis, cervical region: Secondary | ICD-10-CM

## 2023-03-23 DIAGNOSIS — Z981 Arthrodesis status: Secondary | ICD-10-CM

## 2023-03-23 DIAGNOSIS — G959 Disease of spinal cord, unspecified: Secondary | ICD-10-CM

## 2023-03-23 NOTE — Progress Notes (Signed)
 Referring Physician:  No referring provider defined for this encounter.  Primary Physician:  Bernardo Fend, DO  History of Present Illness: 03/23/2023 Scott Howell continues to have neck pain.  He has been doing physical therapy for his hand.  He continues to have some issues with his balance.  02/28/2023 Scott Howell is here today with a chief complaint of neck pain.  He is having some problems with his balance.  He had a right ulnar nerve transposition in the summer and is recovering from that.  His hand function has improved.  The symptoms are causing a significant impact on the patient's life.   I have utilized the care everywhere function in epic to review the outside records available from external health systems.  Progress Note from [place], [place] on 12/26/22:   Scott Howell has a history of seizures, HTN, hyperlipidemia, COPD, bipolar, FM, history of ETOH abuse, and GERD.    He is s/p ulnar nerve decompression, AIN to ulnar nerve transfer, and ulnar nerve peripheral nerve stimulator placement by Dr. Claudene on 10/04/22.    His pain was improving at his last visit, but he did not like the feeling of the stimulator, so he stopped using it. Dr. Claudene sent him to OT and he was to follow up prn.    History of ACDF C3-C5 for myelopathy on 01/13/21 with Dr. Clois. History of dysfunction in right hand due to history of multiple fractures.    PMR referred him to [facility] for left C7-T1 IL ESI. This has not been done.    He feels like his right hand is improving- he is on OT for this and has appointment later today.    He is now having constant neck pain that radiates to both shoulder blades. No arm pain. Neck pain is sharp and stabbing. Worse when he moves his head. Some relief with heat and resting. He has numbness, tingling, weakness in right ring and small finger. No numbness, tingling, or weakness in his left hand.     He is taking neurontin , mobic , and zanaflex . He is  afraid of needles and doesn't think he will be able to have cervical injection.    Bowel/Bladder Dysfunction: none   He smokes 1 and 1/2 ppd x 30 years.    Conservative measures:  Physical therapy: no recent Multimodal medical therapy including regular antiinflammatories: celebrex, robaxin , oxycodone   Injections: No recent epidural steroid injections   Past Surgery:  History of ACDF C3-C5 for myelopathy on 01/13/21 with Dr. Clois History of 5 previous cervical fusion surgeries  Review of Systems:  A 10 point review of systems is negative, except for the pertinent positives and negatives detailed in the HPI.  Past Medical History: Past Medical History:  Diagnosis Date   Adenomatous polyp of colon    Arthritis    Asthma    Benign fibroma of prostate    Bipolar 1 disorder (HCC)    Cervical myelopathy (HCC)    Cervical post-laminectomy syndrome    Cervical spinal cord compression (HCC)    Chronic knee pain    Chronic pain associated with significant psychosocial dysfunction    Chronic prescription benzodiazepine use    Constipation    COPD (chronic obstructive pulmonary disease) (HCC)    Decreased motor strength    Drug abuse, opioid type (HCC)    Dyspnea    Elevated liver enzymes    Erectile dysfunction    Fibromyalgia    GERD (gastroesophageal reflux  disease)    H/O ETOH abuse    Headache    High cholesterol    History of 2019 novel coronavirus disease (COVID-19) 11/01/2021   History of transient cerebral ischemia    Homicidal ideation    Hypertension    Hypokalemia    Living accommodation issues    a.) as of 03/29/2021 --> living in camper   Lower back pain    Lumbar canal stenosis    Marijuana use    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   Pneumonia    Presence of permanent cardiac pacemaker    S/P TKR (total knee replacement)    Schizophrenia (HCC)    Seizures (HCC) 11/2020   Status post hardware removal    Stroke (HCC)    no deficits    Tobacco use     Past Surgical History: Past Surgical History:  Procedure Laterality Date   ANTERIOR CERVICAL DECOMP/DISCECTOMY FUSION N/A 01/13/2021   Procedure: C3-5 ANTERIOR CERVICAL DECOMPRESSION/DISCECTOMY FUSION;  Surgeon: Clois Fret, MD;  Location: [facility] ORS;  Service: Neurosurgery;  Laterality: N/A;   CERVICAL SPINE SURGERY     4   CLAVICLE SURGERY     COLONOSCOPY WITH PROPOFOL  N/A 06/29/2022   Procedure: COLONOSCOPY WITH PROPOFOL ;  Surgeon: Therisa Bi, MD;  Location: [facility] END[facility]COPY;  Service: Gastroenterology;  Laterality: N/A;  USES MED TRANS; WILL NEED TIME ON FRIDAY, 06/24/2022   HARDWARE REMOVAL Left 04/20/2015   Procedure: HARDWARE REMOVAL left leg;  Surgeon: Kayla Pinal, MD;  Location: [facility] ORS;  Service: Orthopedics;  Laterality: Left;   KNEE SURGERY Left    NECK SURGERY     x6   SHOULDER SURGERY Left    TOTAL KNEE ARTHROPLASTY Left 04/12/2021   Procedure: TOTAL KNEE ARTHROPLASTY;  Surgeon: Leora Lynwood SAUNDERS, MD;  Location: [facility] OR[facility];  Service: Orthopedics;  Laterality: Left;   ULNAR NERVE TRANSPOSITION Right 11/17/2021   Procedure: SUBCUTANEOUS TRANSPOSITION OF ULNAR NERVE, RIGHT ELBOW;  Surgeon: Edie Norleen PARAS, MD;  Location: [facility];  Service: Orthopedics;  Laterality: Right;   ULNAR NERVE TRANSPOSITION Right 10/04/2022   Procedure: ULNAR NERVE TRANSPOSITION;  Surgeon: Claudene Penne ORN, MD;  Location: [facility];  Service: Neurosurgery;  Laterality: Right;  NO BLOCK    Allergies: Allergies as of 03/23/2023 - Review Complete 03/23/2023  Allergen Reaction Noted   Acetaminophen  Other (See Comments) 07/06/2014    Medications:  Current Outpatient Medications:    albuterol  (VENTOLIN  HFA) 108 (90 Base) MCG/ACT inhaler, INHALE 2 PUFFS INTO LUNGS EVERY 6 HOURS AS NEEDED FOR WHEEZING, Disp: 18 each, Rfl: 2   atorvastatin  (LIPITOR) 40 MG tablet, TAKE 1 TABLET BY MOUTH EVERY DAY, Disp: 90 tablet, Rfl: 1   budesonide -formoterol  (SYMBICORT ) 160-4.5 MCG/ACT inhaler, Inhale 2  puffs into the lungs 2 (two) times daily., Disp: 1 each, Rfl: 3   divalproex  (DEPAKOTE ) 250 MG DR tablet, Take 1 tablet (250 mg total) by mouth 3 (three) times daily., Disp: 90 tablet, Rfl: 1   famotidine  (PEPCID ) 20 MG tablet, Take 1 tablet (20 mg total) by mouth 2 (two) times daily., Disp: 180 tablet, Rfl: 1   gabapentin  (NEURONTIN ) 300 MG capsule, Take 1 capsule (300 mg total) by mouth 3 (three) times daily., Disp: 270 capsule, Rfl: 3   ipratropium-albuterol  (DUONEB) 0.5-2.5 (3) MG/3ML SOLN, Take 3 mLs by nebulization 2 (two) times daily., Disp: 360 mL, Rfl: 1   lisinopril  (ZESTRIL ) 10 MG tablet, Take 1 tablet (10 mg total) by mouth daily., Disp: 90  tablet, Rfl: 1   QUEtiapine  (SEROQUEL ) 200 MG tablet, Take 500 mg by mouth at bedtime., Disp: , Rfl:   Social History: Social History   Tobacco Use   Smoking status: Every Day    Current packs/day: 1.50    Average packs/day: 1.5 packs/day for 30.0 years (45.0 ttl pk-yrs)    Types: Cigarettes   Smokeless tobacco: Never  Vaping Use   Vaping status: Never Used  Substance Use Topics   Alcohol use: No    Comment: Sober for 10 yrs.   Drug use: Yes    Frequency: 7.0 times per week    Types: Marijuana    Family Medical History: Family History  Problem Relation Age of Onset   Diabetes Mother    Hyperlipidemia Mother    Hypertension Mother    Diabetes Maternal Aunt    Cancer Maternal Aunt    Hyperlipidemia Maternal Aunt    Hypertension Maternal Aunt    Diabetes Maternal Uncle    Cancer Maternal Uncle    Hyperlipidemia Maternal Uncle    Hypertension Maternal Uncle     Physical Examination: Vitals:   03/23/23 1001  BP: 130/78     General: Patient is in no apparent distress. Attention to examination is appropriate.  Neck:   Supple.  Full range of motion.  Respiratory: Patient is breathing without any difficulty.   NEUROLOGICAL:     Awake, alert, oriented to person, place, and time.  Speech is clear and fluent.   Cranial  Nerves: Pupils equal round and reactive to light.  Facial tone is symmetric.  Facial sensation is symmetric. Shoulder shrug is symmetric. Tongue protrusion is midline.  There is no pronator drift.  Strength: Side Biceps Triceps Deltoid Interossei Grip Wrist Ext. Wrist Flex.  R 5 5 5 3  4- 4 4  L 5 5 5 5 5 5 5    Side Iliopsoas Quads Hamstring PF DF EHL  R 5 5 5 5 5 5   L 5 5 5 5 5 5    Reflexes are 2+ and symmetric at the biceps, triceps, brachioradialis, patella and achilles.   Hoffman's is absent.   Bilateral upper and lower extremity sensation is intact to light touch.    No evidence of dysmetria noted.  Gait is slowed.     Medical Decision Making  Imaging: MRI C spine 08/08/2022 IMPRESSION: 1. Status post interval extension of fusion to C3-C5, with persistent moderate to severe spinal canal stenosis and severe bilateral neural foraminal narrowing at C4-C5. 2. C3-C4 mild-to-moderate left and mild right neural foraminal narrowing, without residual spinal canal stenosis. 3. C5-C6 mild left neural foraminal narrowing, unchanged. 4. C6-C7 moderate left neural foraminal narrowing, unchanged. 5. C7-T1 mild spinal canal stenosis with severe right and moderate to severe left neural foraminal narrowing, unchanged. 6. T2 hyperintense foci in the bilateral anterior horn cells at the level of C6-C7 appear more prominent than on the prior exam but were likely present. This likely represents myelomalacia.     Electronically Signed   By: Donald Campion M.D.   On: 08/14/2022 21:29  CT C spine 03/08/2023 FINDINGS: Alignment: Normal.   Skull base and vertebrae: ACDF from C3-C7. There are 2 plates spanning C3-C5 and C5-C7. Solid arthrodesis is seen except at the level of C4-5 where there is scalloping around the lower cage at the C5 superior endplate and C5 screw loosening associated with the upper plate. Posterior instrumentation spanning C5-C7 with solid posterior-lateral arthrodesis.  No fracture or aggressive bone lesion.  Soft tissues and spinal canal: No evidence of inflammation or mass.   Disc levels: Underestimated compared to prior MRI there is biforaminal impingement at C4-5 from disc height loss and ridging. The other postoperative levels foramina show better patency. Spinal canal better assessed on prior MRI.   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     Electronically Signed   By: Dorn Roulette M.D.   On: 03/18/2023 07:42  I have personally reviewed the images and agree with the above interpretation.  EMG 07/18/2022 Impression: Abnormal study. There is electrodiagnostic evidence of a chronic, mild right ulnar mononeuropathy.  Thank you for the referral of this patient. It was our privilege to participate in care of your patient. Feel free to contact us  with any further questions.  _____________________________ Arthea Farrow, MD   Assessment and Plan: Mr. Jaskiewicz is a pleasant 53 y.o. male with history of cervical myelopathy with cervical stenosis at C4-5.  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  This is the level where he has compression.    I think he is a candidate for intervention to try to establish fusion at C4-5.  However, I would like him to discontinue smoking.  We will contact him on March 5.  If he is stopped still working at that time, we will send him for a nicotine  test and then schedule posterior surgical intervention with C3-7 instrumentation with C4-5 decompression.    I spent a total of 10 minutes in this patient's care today. This time was spent reviewing pertinent records including imaging studies, obtaining and confirming history, performing a directed evaluation, formulating and discussing my recommendations, and documenting the visit within the medical record.      Thank you for involving me in  the care of this patient.      Davyd Podgorski K. Clois MD, [facility] Neurosurgery

## 2023-03-29 ENCOUNTER — Ambulatory Visit: Payer: MEDICAID

## 2023-03-29 DIAGNOSIS — G5621 Lesion of ulnar nerve, right upper limb: Secondary | ICD-10-CM

## 2023-03-29 DIAGNOSIS — R278 Other lack of coordination: Secondary | ICD-10-CM

## 2023-03-29 DIAGNOSIS — R2681 Unsteadiness on feet: Secondary | ICD-10-CM

## 2023-03-29 DIAGNOSIS — M25631 Stiffness of right wrist, not elsewhere classified: Secondary | ICD-10-CM

## 2023-03-29 DIAGNOSIS — M542 Cervicalgia: Secondary | ICD-10-CM

## 2023-03-29 DIAGNOSIS — M6281 Muscle weakness (generalized): Secondary | ICD-10-CM

## 2023-03-29 DIAGNOSIS — M25641 Stiffness of right hand, not elsewhere classified: Secondary | ICD-10-CM
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
## 2023-03-29 NOTE — Therapy (Signed)
OUTPATIENT OCCUPATIONAL Scott ORTHO TREATMENT NOTE   Patient Name: Scott Howell MRN: [id number] DOB:12-25-1970, 53 y.o., male Today's Date: 03/29/2023  PCP: Dr. Margarita Howell REFERRING PROVIDER: Dr. Ernestine Howell   END OF SESSION:  OT End of Session - 03/29/23 1543     Visit Number 28    Number of Visits 37    Date for OT Re-Evaluation 05/24/23    Authorization Type approved for 1/22-7/21 for 12 OT visits    Authorization Time Period Progress reporting period beginning 03/01/23    Progress Note Due on Visit 10    OT Start Time 1448    OT Stop Time 1530    OT Time Calculation (min) 42 min    Activity Tolerance Patient tolerated treatment well    Behavior During Scott WFL for tasks assessed/performed            Past Medical History:  Diagnosis Date   Adenomatous polyp of colon    Arthritis    Asthma    Benign fibroma of prostate    Bipolar 1 disorder (HCC)    Cervical myelopathy (HCC)    Cervical post-laminectomy syndrome    Cervical spinal cord compression (HCC)    Chronic knee pain    Chronic pain associated with significant psychosocial dysfunction    Chronic prescription benzodiazepine use    Constipation    COPD (chronic obstructive pulmonary disease) (HCC)    Decreased motor strength    Drug abuse, opioid type (HCC)    Dyspnea    Elevated liver enzymes    Erectile dysfunction    Fibromyalgia    GERD (gastroesophageal reflux disease)    H/O ETOH abuse    Headache    High cholesterol    History of 2019 novel coronavirus disease (COVID-19) 11/01/2021   History of transient cerebral ischemia    Homicidal ideation    Hypertension    Hypokalemia    Living accommodation issues    a.) as of 03/29/2021 --> living in camper   Lower back pain    Lumbar canal stenosis    Marijuana use    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   Pneumonia    Presence of permanent cardiac pacemaker    S/P TKR (total knee replacement)    Schizophrenia (HCC)     Seizures (HCC) 11/2020   Status post hardware removal    Stroke (HCC)    no deficits   Tobacco use    Past Surgical History:  Procedure Laterality Date   ANTERIOR CERVICAL DECOMP/DISCECTOMY FUSION N/A 01/13/2021   Procedure: C3-5 ANTERIOR CERVICAL DECOMPRESSION/DISCECTOMY FUSION;  Surgeon: Scott Night, MD;  Location: [facility];  Service: Neurosurgery;  Laterality: N/A;   CERVICAL SPINE SURGERY     4   CLAVICLE SURGERY     COLONOSCOPY WITH PROPOFOL N/A 06/29/2022   Procedure: COLONOSCOPY WITH PROPOFOL;  Surgeon: Scott Mood, MD;  Location: [facility];  Service: Gastroenterology;  Laterality: N/A;  USES MED TRANS; WILL NEED TIME ON FRIDAY, 06/24/2022   HARDWARE REMOVAL Left 04/20/2015   Procedure: HARDWARE REMOVAL left leg;  Surgeon: Scott Saint, MD;  Location: [facility];  Service: Orthopedics;  Laterality: Left;   KNEE SURGERY Left    NECK SURGERY     x6   SHOULDER SURGERY Left    TOTAL KNEE ARTHROPLASTY Left 04/12/2021   Procedure: TOTAL KNEE ARTHROPLASTY;  Surgeon: Scott Herrlich, MD;  Location: [facility];  Service: Orthopedics;  Laterality: Left;  ULNAR NERVE TRANSPOSITION Right 11/17/2021   Procedure: SUBCUTANEOUS TRANSPOSITION OF ULNAR NERVE, RIGHT ELBOW;  Surgeon: Scott Flake, MD;  Location: [facility];  Service: Orthopedics;  Laterality: Right;   ULNAR NERVE TRANSPOSITION Right 10/04/2022   Procedure: ULNAR NERVE TRANSPOSITION;  Surgeon: Scott Kim, MD;  Location: [facility];  Service: Neurosurgery;  Laterality: Right;  NO BLOCK   Patient Active Problem List   Diagnosis Date Noted   Ulnar neuropathy of right upper extremity 10/05/2022   Hand weakness 10/05/2022   Nerve pain 10/05/2022   Adenomatous polyp of colon 06/29/2022   S/P TKR (total knee replacement) using cement, left 04/12/2021   Cervical myelopathy (HCC) 01/13/2021   Seizure (HCC) 11/26/2020   Abnormal thyroid blood test 04/06/2016   Encounter for screening colonoscopy 01/05/2016   Abnormal  weight gain 01/05/2016   Chronic prescription benzodiazepine use 10/19/2015   Elevated liver enzymes 08/20/2015   Medication monitoring encounter 08/20/2015   Hypokalemia 08/20/2015   Erectile dysfunction 04/22/2015   Status post hardware removal 04/20/2015   Bipolar 2 disorder (HCC) 07/29/2014   Marijuana abuse 07/29/2014   Chronic knee pain 07/29/2014   Bipolar 1 disorder, mixed (HCC)    Drug abuse, opioid type (HCC) 06/28/2013   GERD without esophagitis 10/03/2012   Hypertension goal BP (blood pressure) < 140/90 10/03/2012   Chronic pain associated with significant psychosocial dysfunction 07/17/2012   Lumbar canal stenosis 04/09/2012   Hypercholesterolemia 01/06/2012   Constipation 11/11/2011   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/2010   Decreased motor strength 09/30/2010   Current tobacco use 09/30/2010   H/O transient cerebral ischemia 09/09/2010   Low back pain 08/30/2010   Cervical spinal cord compression (HCC) 07/22/2010   Cervical post-laminectomy syndrome 07/22/2010   Chronic obstructive pulmonary disease (HCC) 02/23/2010   ONSET DATE: 10/04/22 (most recent sx)  REFERRING Howell: G56.21 (ICD-10-CM) - Ulnar neuropathy of right upper extremity   Scott Howell:  Muscle weakness (generalized)  Other lack of coordination  Stiffness of right hand, not elsewhere classified  Ulnar neuropathy at elbow of right upper extremity  Stiffness of right wrist, not elsewhere classified  Rationale for Evaluation and Treatment: Rehabilitation  SUBJECTIVE:  SUBJECTIVE STATEMENT: Pt reports that he had a follow up with Dr. Myer Howell last week and another cervical spinal fusion is recommended, but pt was told he would have to quit smoking for a while in order to do the surgery.  Pt reports that he does not plan to quite smoking, and he plans to discuss whether he could use a nicotine patch as he did with previous surgeries. Pt accompanied by: self  PERTINENT HISTORY:  Per note from Dr.  Katrinka Howell on 11/16/22: Scott Howell is 2 weeks status post Ulnar nerve decompression, AIN to ulnar nerve transfer, and ulnar nerve peripheral nerve stimulator placement. Given oxycodone on discharge from the hospital.   Per medical record, pt underwent first ulnar nerve transposition on 11/17/21 and participated in OT post surgery.  2nd ulnar nerve transposition done on 10/04/22.  Pt had nerve stimulator placed but pt reports he does not like how it feels so he does not turn it on.  Pt also with hx of 6 cervical spinal surgeries; see above for additional hx.    PRECAUTIONS: None  RED FLAGS: None   WEIGHT BEARING RESTRICTIONS: No  PAIN: 03/29/23: 7-8/10 pain RUE Are you having pain? Yes: NPRS scale: 2/10 at rest, with activity 6-7/10 Pain location: ulnar side of R arm extending from elbow to  hand Pain description: pins and needles, occasional spasms, numbness Aggravating factors: "my arm doesn't like the cold, increased activity  Relieving factors: heat, rest, Gabapentin  LIVING ENVIRONMENT: Lives with: alone with 1 dog  PLOF: Independent, works as a Scientist, water quality  PATIENT GOALS: build up the strength in the R arm and hand  NEXT MD VISIT:  OBJECTIVE:  Note: Objective measures were completed at Evaluation unless otherwise noted.  HAND DOMINANCE: Right  ADLs: Overall ADLs: Pt reports he always tries to use the R arm as able, but in a limited capacity, and is unable to use the R hand with good accuracy or efficiency. Transfers/ambulation related to ADLs: indep Eating: gross grasp in R hand to hold utensils, difficulty cutting food  Grooming: uses L non-dominant hand to shave  UB Dressing: difficulty with clothing fasteners LB Dressing: difficulty with clothing fasteners, extra time to tie shoe laces  Toileting: uses L non-dominant hand for toilet hygiene Bathing: Pt states that he makes himself use the R hand when bathing but control is limited  FUNCTIONAL OUTCOME MEASURES: FOTO: 50;  predicted with 56 12/26/22: FOTO: 49 02/02/23: FOTO: 50 03/01/23: FOTO: 59   UPPER EXTREMITY ROM:     Active ROM Left eval Right eval Right 12/26/22 Right 02/02/23 Right 03/01/23  Shoulder flexion       Shoulder abduction       Shoulder adduction       Shoulder extension       Shoulder internal rotation       Shoulder external rotation       Elbow flexion       Elbow extension       Wrist flexion       Wrist extension 65 58 60 60 60 (Mps flexed)  Wrist ulnar deviation WNL -30 -30 -30 ( able to achieve neutral wrist with gravity assisted) -25  Wrist radial deviation WNL WNL WNL WNL   Wrist pronation 90 90 90 90 90  Wrist supination 75 70 75 80 80  (Blank rows = not tested)  Active ROM Right eval Left eval  Thumb MCP (0-60)    Thumb IP (0-80)    Thumb Radial abd/add (0-55)     Thumb Palmar abd/add (0-45)     Thumb Opposition to Small Finger     Index MCP (0-90)     Index PIP (0-100)     Index DIP (0-70)      Long MCP (0-90)      Long PIP (0-100)      Long DIP (0-70)      Ring MCP (0-90)      Ring PIP (0-100)      Ring DIP (0-70)      Little MCP (0-90)      Little PIP (0-100)      Little DIP (0-70)      (Blank rows = not tested)  Eval: R hand digits limited in PIP extension in 4th and 5th digits with neutral wrist (lacking ~25% ext in 4th digit PIP and 50% ext in 5th digit PIP) 12/26/22: R hand lacking ~10% ext in 4th digit PIP and lacking ~30% ext in 5th digit PIP 02/02/23: R hand lacking ~10% ext in 4th digit PIP and lacking ~30% ext in 5th digit PIP 03/01/23: R hand lacking ~ R finger lacking about 25% ext, 5th digit lacking ~50% ext (feels bad today)  Eval: -limited active digit abd in 4th and 5th digits 12/26/22: able to achieve slight digit  abduction in 4th and 5th digits when compensating with wrist and MP flexion  02/02/23: Same as 12/26/22; fingers can abd on table top but by radially deviating  03/01/23: Pt can achieve minimal abduction of 4th and 5th  digits if wrist is slightly flexed    Eval: -Unable to oppose R 4th and 5th digits to thumb  12/26/22: unable to oppose R 4th and 5th digits to thumb 02/02/23: able to oppose R 4th and 5th digit to thumb, but with increased effort 03/01/23: able to oppose R 4th digit to thumb, able to manage 5th digit if stabilizing digits 2-4 in extension   UPPER EXTREMITY MMT:     MMT Left eval Right eval Right 12/26/22 Right 03/01/23  Shoulder flexion      Shoulder abduction      Shoulder adduction      Shoulder extension      Shoulder internal rotation      Shoulder external rotation      Middle trapezius      Lower trapezius      Elbow flexion 5 5    Elbow extension 5 5    Wrist flexion 5 4 4+ 4+  Wrist extension 5 4+ (simultaneous radial dev) 4+ (slight radial dev) 4+ (slight radial dev)  Wrist ulnar deviation 5 1  2   Wrist radial deviation 5 5  5   Wrist pronation 5 5  5   Wrist supination 5 5  5   (Blank rows = not tested)  HAND FUNCTION: Grip strength: Right: 26 lbs; Left: 95 lbs, Lateral pinch: Right: 7 lbs, Left: 32 lbs, and 3 point pinch: Right: 4 lbs, Left: 27 lbs 12/12/22: R grip 35 lbs; R lateral pinch: 8 lbs: 3 point pinch: 4 lbs  12/26/22: R grip 33 lbs; R lateral pinch: 8 lbs; 3 point pinch: 4 lbs (Pt reports increased fatigue in R hand today following extensive use of the hand over the weekend when using a tool) 02/02/23: R grip 32 lbs; R lateral pinch: 4 lbs, 3 point pinch: 4 lbs (Pt reports using tools and shovels extensively over the last 2 days, possibly limited by muscle fatigue from overuse) 03/01/23: R grip 30 lbs (more painful today); L grip 96 lbs; R lateral pinch: R 8 lbs, L 27 lbs; 3 point pinch: R 4 lbs,  L 19 lbs  COORDINATION: 9 Hole Peg test: Right: 2 min 39 sec; Left: 26 sec 12/26/22: R 48 secs 02/02/23: R 51 sec  03/01/23: R 46 sec   SENSATION: Light touch: Impaired ; ulnar nerve distribution  EDEMA: very mild edema surrounding incision sites on R forearm  and elbow   COGNITION: Overall cognitive status: Within functional limits for tasks assessed Areas of impairment:  hx of bipolar 1, schizophrenia, and drug and alcohol abuse  OBSERVATIONS:  Pt pleasant, cooperative, and eager to regain strength in his R dominant arm.  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   Note for education/handouts: Pt reports he is limited with his reading and writing ability, but states he will always ask if he doesn't understand something.  TODAY'S TREATMENT:  DATE: 03/29/23 Therapeutic Exercise: -Facilitated RUE flexibility exercises: -Completed passive stretching for R wrist and digit ext, ulnar deviation, R forearm sup  Therapeutic Activity: Facilitated R hand FMC/dexterity skills as follows: -Ball pegs and pegboard, working to reposition pegs within thumb, index, and middle finger in prep for placement into board -Promoted R hand digit flex/ext with pron/sup to grasp a handful of pegs and discard into container -Grooved pegs and pegboard: min vc to minimize compensation to isolate digits from wrist and forearm compensatory movements when placing pegs into pegboard   PATIENT EDUCATION: Education details: Advised on nicotine putting pt at risk for slower healing/infections post sx Person educated: Patient Education method: Explanation Education comprehension: verbalized understanding  HOME EXERCISE PROGRAM: Yellow theraputty, wrist/forearm strengthening with 2# dumbbell, ulnar nerve glides  GOALS: Goals reviewed with patient? Yes  SHORT TERM GOALS: Target date: 01/02/23  Pt will be indep to perform HEP for improving distal RUE flexibility, strength, and coordination. Baseline: Eval: Not yet initiated; 12/26/22: pt routinely works on resistive hand strengthening exercises with tools  and putty at home, as well as stretching wrist and digits, and FMC activities.  Goal status: achieved  LONG TERM GOALS: Target date: 05/24/23  Pt will increase FOTO score to 64 or better to indicate improvement in self perceived functional use of the R arm with daily tasks (revised on 03/01/23 from 56). Baseline: Eval: 50; 12/26/22: 49; 02/02/23: 50; 03/01/23: 59 Goal status: achieved/revised/ongoing  2.  Pt will increase R grip strength by 10 or more lbs to ease ability carry heavy grocery bags in R dominant hand. Baseline: Eval: R grip 26 lbs (non-dominant L 95 lbs); 12/26/22: R grip 33 lbs; 02/02/23: R grip 32 lbs; 03/01/23: R grip 30 lbs Goal status: ongoing  3.  Pt will increase R lateral pinch strength by 5 or more lbs to ease ability to open drink bottles. Baseline: Eval: R 7 lbs (non-dominant L 32 lbs); 12/26/22: R 8 lbs; 02/02/23: 4 lbs; 03/01/23: R 8 lbs Goal status: ongoing  4.  Pt will tolerate manual Scott, therapeutic modalities, and exercises to decrease pain in RUE to a reported 4/10 pain or less with activity.   Baseline: Eval: R forearm and hand 6-7/10 pain with activity; 12/26/22: Pt reports increased pain today after increased use of R hand over the weekend; still at 6-7/10 pain with activity; 02/02/23: 1/10 pain at rest, 3.5/10 pain with activity; 03/01/23:  2-3/10 pain in the RUE prior to seizure, today reporting 7/10 pain wide spread throughout the body and down RUE, with pt reporting that the last major seizure caused him to hurt for 3 weeks.   Goal status: ongoing  5.  Pt will increase R hand FMC/dexterity skills to improve efficiency with clothing fasteners as noted by completion of 9 hole peg test in <40 sec (revised on 12/26/22 from <2 min).  Baseline: Eval: 2 min 39 sec; extra time clothing fasteners, specifically buttons and tying shoe laces; 12/26/22: R 48 sec; 02/02/23: 51 sec; 03/01/23: R 46 sec Goal status: ongoing  ASSESSMENT:   CLINICAL IMPRESSION:   Pt with  increased pain this date d/t cold weather, per pt.  Pt verbalized frustration with follow up visit with surgeon regarding MD recommendation for pt to quit smoking in order to perform another cervical spinal fusion.  Pt's concern is that he does not want to quit smoking, but pt does plan to follow up with MD and insurance with additional questions.  Pt tolerated therapeutic exercises and activities  well this date with intermittent rest for passive stretching as noted above, and use of moist heat for pain management.  Pt reports that he continues to engage the R hand into everything he does at home, but with pain and limited coordination.  Pt will continue to benefit from skilled OT to address RUE weakness, coordination deficits, stiffness, and pain in the RUE, in order to work towards increased functional use of the R arm for ADL/IADL/work tasks.    PERFORMANCE DEFICITS: in functional skills including ADLs, IADLs, coordination, dexterity, sensation, edema, ROM, strength, pain, fascial restrictions, muscle spasms, flexibility, Fine motor control, body mechanics, decreased knowledge of use of DME, skin integrity, and UE functional use, and psychosocial skills including coping strategies, environmental adaptation, habits, and routines and behaviors.   IMPAIRMENTS: are limiting patient from ADLs, IADLs, rest and sleep, work, and leisure.   COMORBIDITIES: has co-morbidities such as hx of 6 cervical spinal surgeries, bipolar 1, schizophrenia  that affects occupational performance. Patient will benefit from skilled OT to address above impairments and improve overall function.  MODIFICATION OR ASSISTANCE TO COMPLETE EVALUATION: No modification of tasks or assist necessary to complete an evaluation.  OT OCCUPATIONAL PROFILE AND HISTORY: Problem focused assessment: Including review of records relating to presenting problem.  CLINICAL DECISION MAKING: Moderate - several treatment options, min-mod task modification  necessary  REHAB POTENTIAL: Good  EVALUATION COMPLEXITY: Moderate      PLAN:  OT FREQUENCY: 1x/week  OT DURATION: 12 weeks  PLANNED INTERVENTIONS: self care/ADL training, therapeutic exercise, therapeutic activity, neuromuscular re-education, manual Scott, scar mobilization, passive range of motion, splinting, electrical stimulation, paraffin, moist heat, cryotherapy, contrast bath, patient/family education, cognitive remediation/compensation, psychosocial skills training, coping strategies training, and DME and/or AE instructions  RECOMMENDED OTHER SERVICES: None at this time  CONSULTED AND AGREED WITH PLAN OF CARE: Patient  PLAN FOR NEXT SESSION: see above  Danelle Earthly, MS, OTR/L  Otis Dials, OT 03/29/2023, 3:45 PM

## 2023-04-05 ENCOUNTER — Ambulatory Visit: Payer: MEDICAID
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
## 2023-04-12 ENCOUNTER — Ambulatory Visit: Payer: MEDICAID

## 2023-04-12 DIAGNOSIS — M6281 Muscle weakness (generalized): Secondary | ICD-10-CM

## 2023-04-12 DIAGNOSIS — R278 Other lack of coordination: Secondary | ICD-10-CM

## 2023-04-12 DIAGNOSIS — M542 Cervicalgia: Secondary | ICD-10-CM

## 2023-04-12 DIAGNOSIS — M25641 Stiffness of right hand, not elsewhere classified: Secondary | ICD-10-CM

## 2023-04-12 DIAGNOSIS — R2681 Unsteadiness on feet: Secondary | ICD-10-CM

## 2023-04-12 DIAGNOSIS — G5621 Lesion of ulnar nerve, right upper limb: Secondary | ICD-10-CM

## 2023-04-12 DIAGNOSIS — M25631 Stiffness of right wrist, not elsewhere classified: Secondary | ICD-10-CM

## 2023-04-12 NOTE — Therapy (Signed)
 OUTPATIENT OCCUPATIONAL THERAPY ORTHO TREATMENT NOTE   Patient Name: Scott Howell MRN: [id number] DOB:January 05, 1971, 53 y.o., male Today's Date: 04/12/2023  PCP: Dr. Margarita Mail REFERRING PROVIDER: Dr. Ernestine Mcmurray   END OF SESSION:  OT End of Session - 04/12/23 1025     Visit Number 29    Number of Visits 37    Date for OT Re-Evaluation 05/24/23    Authorization Type approved for 1/22-7/21 for 12 OT visits    Authorization Time Period Progress reporting period beginning 03/01/23    Progress Note Due on Visit 10    OT Start Time 0940    OT Stop Time 1020    OT Time Calculation (min) 40 min    Activity Tolerance Patient tolerated treatment well    Behavior During Therapy WFL for tasks assessed/performed            Past Medical History:  Diagnosis Date   Adenomatous polyp of colon    Arthritis    Asthma    Benign fibroma of prostate    Bipolar 1 disorder (HCC)    Cervical myelopathy (HCC)    Cervical post-laminectomy syndrome    Cervical spinal cord compression (HCC)    Chronic knee pain    Chronic pain associated with significant psychosocial dysfunction    Chronic prescription benzodiazepine use    Constipation    COPD (chronic obstructive pulmonary disease) (HCC)    Decreased motor strength    Drug abuse, opioid type (HCC)    Dyspnea    Elevated liver enzymes    Erectile dysfunction    Fibromyalgia    GERD (gastroesophageal reflux disease)    H/O ETOH abuse    Headache    High cholesterol    History of 2019 novel coronavirus disease (COVID-19) 11/01/2021   History of transient cerebral ischemia    Homicidal ideation    Hypertension    Hypokalemia    Living accommodation issues    a.) as of 03/29/2021 --> living in camper   Lower back pain    Lumbar canal stenosis    Marijuana use    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   Pneumonia    Presence of permanent cardiac pacemaker    S/P TKR (total knee replacement)    Schizophrenia (HCC)     Seizures (HCC) 11/2020   Status post hardware removal    Stroke (HCC)    no deficits   Tobacco use    Past Surgical History:  Procedure Laterality Date   ANTERIOR CERVICAL DECOMP/DISCECTOMY FUSION N/A 01/13/2021   Procedure: C3-5 ANTERIOR CERVICAL DECOMPRESSION/DISCECTOMY FUSION;  Surgeon: Venetia Night, MD;  Location: [facility];  Service: Neurosurgery;  Laterality: N/A;   CERVICAL SPINE SURGERY     4   CLAVICLE SURGERY     COLONOSCOPY WITH PROPOFOL N/A 06/29/2022   Procedure: COLONOSCOPY WITH PROPOFOL;  Surgeon: Wyline Mood, MD;  Location: [facility];  Service: Gastroenterology;  Laterality: N/A;  USES MED TRANS; WILL NEED TIME ON FRIDAY, 06/24/2022   HARDWARE REMOVAL Left 04/20/2015   Procedure: HARDWARE REMOVAL left leg;  Surgeon: Deeann Saint, MD;  Location: [facility];  Service: Orthopedics;  Laterality: Left;   KNEE SURGERY Left    NECK SURGERY     x6   SHOULDER SURGERY Left    TOTAL KNEE ARTHROPLASTY Left 04/12/2021   Procedure: TOTAL KNEE ARTHROPLASTY;  Surgeon: Lyndle Herrlich, MD;  Location: [facility];  Service: Orthopedics;  Laterality: Left;  ULNAR NERVE TRANSPOSITION Right 11/17/2021   Procedure: SUBCUTANEOUS TRANSPOSITION OF ULNAR NERVE, RIGHT ELBOW;  Surgeon: Christena Flake, MD;  Location: [facility];  Service: Orthopedics;  Laterality: Right;   ULNAR NERVE TRANSPOSITION Right 10/04/2022   Procedure: ULNAR NERVE TRANSPOSITION;  Surgeon: Lovenia Kim, MD;  Location: [facility];  Service: Neurosurgery;  Laterality: Right;  NO BLOCK   Patient Active Problem List   Diagnosis Date Noted   Ulnar neuropathy of right upper extremity 10/05/2022   Hand weakness 10/05/2022   Nerve pain 10/05/2022   Adenomatous polyp of colon 06/29/2022   S/P TKR (total knee replacement) using cement, left 04/12/2021   Cervical myelopathy (HCC) 01/13/2021   Seizure (HCC) 11/26/2020   Abnormal thyroid blood test 04/06/2016   Encounter for screening colonoscopy 01/05/2016   Abnormal  weight gain 01/05/2016   Chronic prescription benzodiazepine use 10/19/2015   Elevated liver enzymes 08/20/2015   Medication monitoring encounter 08/20/2015   Hypokalemia 08/20/2015   Erectile dysfunction 04/22/2015   Status post hardware removal 04/20/2015   Bipolar 2 disorder (HCC) 07/29/2014   Marijuana abuse 07/29/2014   Chronic knee pain 07/29/2014   Bipolar 1 disorder, mixed (HCC)    Drug abuse, opioid type (HCC) 06/28/2013   GERD without esophagitis 10/03/2012   Hypertension goal BP (blood pressure) < 140/90 10/03/2012   Chronic pain associated with significant psychosocial dysfunction 07/17/2012   Lumbar canal stenosis 04/09/2012   Hypercholesterolemia 01/06/2012   Constipation 11/11/2011   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/2010   Decreased motor strength 09/30/2010   Current tobacco use 09/30/2010   H/O transient cerebral ischemia 09/09/2010   Low back pain 08/30/2010   Cervical spinal cord compression (HCC) 07/22/2010   Cervical post-laminectomy syndrome 07/22/2010   Chronic obstructive pulmonary disease (HCC) 02/23/2010   ONSET DATE: 10/04/22 (most recent sx)  REFERRING DIAG: G56.21 (ICD-10-CM) - Ulnar neuropathy of right upper extremity   THERAPY DIAG:  Muscle weakness (generalized)  Other lack of coordination  Stiffness of right hand, not elsewhere classified  Ulnar neuropathy at elbow of right upper extremity  Stiffness of right wrist, not elsewhere classified  Rationale for Evaluation and Treatment: Rehabilitation  SUBJECTIVE:  SUBJECTIVE STATEMENT: Pt reports that he can feel his neck is getting worse and verbalizes more pain in the neck which is causing more pain down the arm.   Pt accompanied by: self  PERTINENT HISTORY:  Per note from Dr. Katrinka Blazing on 11/16/22: [name] is 2 weeks status post Ulnar nerve decompression, AIN to ulnar nerve transfer, and ulnar nerve peripheral nerve stimulator placement. Given oxycodone on discharge from the  hospital.   Per medical record, pt underwent first ulnar nerve transposition on 11/17/21 and participated in OT post surgery.  2nd ulnar nerve transposition done on 10/04/22.  Pt had nerve stimulator placed but pt reports he does not like how it feels so he does not turn it on.  Pt also with hx of 6 cervical spinal surgeries; see above for additional hx.    PRECAUTIONS: None  RED FLAGS: None   WEIGHT BEARING RESTRICTIONS: No  PAIN: 04/12/23: 8/10 pain RUE and cervical pain Are you having pain? Yes: NPRS scale: 2/10 at rest, with activity 6-7/10 Pain location: ulnar side of R arm extending from elbow to hand Pain description: pins and needles, occasional spasms, numbness Aggravating factors: "my arm doesn't like the cold, increased activity  Relieving factors: heat, rest, Gabapentin  LIVING ENVIRONMENT: Lives with: alone with 1 dog  PLOF: Independent,  works as a Scientist, water quality  PATIENT GOALS: build up the strength in the R arm and hand  NEXT MD VISIT:  OBJECTIVE:  Note: Objective measures were completed at Evaluation unless otherwise noted.  HAND DOMINANCE: Right  ADLs: Overall ADLs: Pt reports he always tries to use the R arm as able, but in a limited capacity, and is unable to use the R hand with good accuracy or efficiency. Transfers/ambulation related to ADLs: indep Eating: gross grasp in R hand to hold utensils, difficulty cutting food  Grooming: uses L non-dominant hand to shave  UB Dressing: difficulty with clothing fasteners LB Dressing: difficulty with clothing fasteners, extra time to tie shoe laces  Toileting: uses L non-dominant hand for toilet hygiene Bathing: Pt states that he makes himself use the R hand when bathing but control is limited  FUNCTIONAL OUTCOME MEASURES: FOTO: 50; predicted with 56 12/26/22: FOTO: 49 02/02/23: FOTO: 50 03/01/23: FOTO: 59   UPPER EXTREMITY ROM:     Active ROM Left eval Right eval Right 12/26/22 Right 02/02/23 Right 03/01/23   Shoulder flexion       Shoulder abduction       Shoulder adduction       Shoulder extension       Shoulder internal rotation       Shoulder external rotation       Elbow flexion       Elbow extension       Wrist flexion       Wrist extension 65 58 60 60 60 (Mps flexed)  Wrist ulnar deviation WNL -30 -30 -30 ( able to achieve neutral wrist with gravity assisted) -25  Wrist radial deviation WNL WNL WNL WNL   Wrist pronation 90 90 90 90 90  Wrist supination 75 70 75 80 80  (Blank rows = not tested)  Active ROM Right eval Left eval  Thumb MCP (0-60)    Thumb IP (0-80)    Thumb Radial abd/add (0-55)     Thumb Palmar abd/add (0-45)     Thumb Opposition to Small Finger     Index MCP (0-90)     Index PIP (0-100)     Index DIP (0-70)      Long MCP (0-90)      Long PIP (0-100)      Long DIP (0-70)      Ring MCP (0-90)      Ring PIP (0-100)      Ring DIP (0-70)      Little MCP (0-90)      Little PIP (0-100)      Little DIP (0-70)      (Blank rows = not tested)  Eval: R hand digits limited in PIP extension in 4th and 5th digits with neutral wrist (lacking ~25% ext in 4th digit PIP and 50% ext in 5th digit PIP) 12/26/22: R hand lacking ~10% ext in 4th digit PIP and lacking ~30% ext in 5th digit PIP 02/02/23: R hand lacking ~10% ext in 4th digit PIP and lacking ~30% ext in 5th digit PIP 03/01/23: R hand lacking ~ R finger lacking about 25% ext, 5th digit lacking ~50% ext (feels bad today)  Eval: -limited active digit abd in 4th and 5th digits 12/26/22: able to achieve slight digit abduction in 4th and 5th digits when compensating with wrist and MP flexion  02/02/23: Same as 12/26/22; fingers can abd on table top but by radially deviating  03/01/23: Pt can achieve minimal abduction of 4th  and 5th digits if wrist is slightly flexed    Eval: -Unable to oppose R 4th and 5th digits to thumb  12/26/22: unable to oppose R 4th and 5th digits to thumb 02/02/23: able to oppose R 4th and 5th  digit to thumb, but with increased effort 03/01/23: able to oppose R 4th digit to thumb, able to manage 5th digit if stabilizing digits 2-4 in extension   UPPER EXTREMITY MMT:     MMT Left eval Right eval Right 12/26/22 Right 03/01/23  Shoulder flexion      Shoulder abduction      Shoulder adduction      Shoulder extension      Shoulder internal rotation      Shoulder external rotation      Middle trapezius      Lower trapezius      Elbow flexion 5 5    Elbow extension 5 5    Wrist flexion 5 4 4+ 4+  Wrist extension 5 4+ (simultaneous radial dev) 4+ (slight radial dev) 4+ (slight radial dev)  Wrist ulnar deviation 5 1  2   Wrist radial deviation 5 5  5   Wrist pronation 5 5  5   Wrist supination 5 5  5   (Blank rows = not tested)  HAND FUNCTION: Grip strength: Right: 26 lbs; Left: 95 lbs, Lateral pinch: Right: 7 lbs, Left: 32 lbs, and 3 point pinch: Right: 4 lbs, Left: 27 lbs 12/12/22: R grip 35 lbs; R lateral pinch: 8 lbs: 3 point pinch: 4 lbs  12/26/22: R grip 33 lbs; R lateral pinch: 8 lbs; 3 point pinch: 4 lbs (Pt reports increased fatigue in R hand today following extensive use of the hand over the weekend when using a tool) 02/02/23: R grip 32 lbs; R lateral pinch: 4 lbs, 3 point pinch: 4 lbs (Pt reports using tools and shovels extensively over the last 2 days, possibly limited by muscle fatigue from overuse) 03/01/23: R grip 30 lbs (more painful today); L grip 96 lbs; R lateral pinch: R 8 lbs, L 27 lbs; 3 point pinch: R 4 lbs,  L 19 lbs 04/12/23: R grip 38 lbs  COORDINATION: 9 Hole Peg test: Right: 2 min 39 sec; Left: 26 sec 12/26/22: R 48 secs 02/02/23: R 51 sec  03/01/23: R 46 sec   SENSATION: Light touch: Impaired ; ulnar nerve distribution  EDEMA: very mild edema surrounding incision sites on R forearm and elbow   COGNITION: Overall cognitive status: Within functional limits for tasks assessed Areas of impairment:  hx of bipolar 1, schizophrenia, and drug and  alcohol abuse  OBSERVATIONS:  Pt pleasant, cooperative, and eager to regain strength in his R dominant arm.  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   Note for education/handouts: Pt reports he is limited with his reading and writing ability, but states he will always ask if he doesn't understand something.  TODAY'S TREATMENT:  DATE: 04/12/23 Therapeutic Exercise: -Facilitated RUE flexibility exercises: -Completed passive stretching for R wrist and digit ext, ulnar deviation, R forearm sup -Pinch strengthening with therapy resistant clothespins to target lateral and 3 point pinch  Therapeutic Activity: Facilitated R hand FMC/dexterity skills with the following -manipulation of washers from magnetic dish to promote item storage, translatory movements, pron/sup, and grasp/release -Government social research officer with pegs, washers, and spacers  PATIENT EDUCATION: Education details: RUE ROM exercises Person educated: Patient Education method: Programmer, multimedia, demo Education comprehension: verbalized understanding  HOME EXERCISE PROGRAM: Yellow theraputty, wrist/forearm strengthening with 2# dumbbell, ulnar nerve glides  GOALS: Goals reviewed with patient? Yes  SHORT TERM GOALS: Target date: 01/02/23  Pt will be indep to perform HEP for improving distal RUE flexibility, strength, and coordination. Baseline: Eval: Not yet initiated; 12/26/22: pt routinely works on resistive hand strengthening exercises with tools and putty at home, as well as stretching wrist and digits, and FMC activities.  Goal status: achieved  LONG TERM GOALS: Target date: 05/24/23  Pt will increase FOTO score to 64 or better to indicate improvement in self perceived functional use of the R arm with daily tasks (revised on 03/01/23 from 56). Baseline:  Eval: 50; 12/26/22: 49; 02/02/23: 50; 03/01/23: 59 Goal status: achieved/revised/ongoing  2.  Pt will increase R grip strength by 10 or more lbs to ease ability carry heavy grocery bags in R dominant hand. Baseline: Eval: R grip 26 lbs (non-dominant L 95 lbs); 12/26/22: R grip 33 lbs; 02/02/23: R grip 32 lbs; 03/01/23: R grip 30 lbs Goal status: ongoing  3.  Pt will increase R lateral pinch strength by 5 or more lbs to ease ability to open drink bottles. Baseline: Eval: R 7 lbs (non-dominant L 32 lbs); 12/26/22: R 8 lbs; 02/02/23: 4 lbs; 03/01/23: R 8 lbs Goal status: ongoing  4.  Pt will tolerate manual therapy, therapeutic modalities, and exercises to decrease pain in RUE to a reported 4/10 pain or less with activity.   Baseline: Eval: R forearm and hand 6-7/10 pain with activity; 12/26/22: Pt reports increased pain today after increased use of R hand over the weekend; still at 6-7/10 pain with activity; 02/02/23: 1/10 pain at rest, 3.5/10 pain with activity; 03/01/23:  2-3/10 pain in the RUE prior to seizure, today reporting 7/10 pain wide spread throughout the body and down RUE, with pt reporting that the last major seizure caused him to hurt for 3 weeks.   Goal status: ongoing  5.  Pt will increase R hand FMC/dexterity skills to improve efficiency with clothing fasteners as noted by completion of 9 hole peg test in <40 sec (revised on 12/26/22 from <2 min).  Baseline: Eval: 2 min 39 sec; extra time clothing fasteners, specifically buttons and tying shoe laces; 12/26/22: R 48 sec; 02/02/23: 51 sec; 03/01/23: R 46 sec Goal status: ongoing  ASSESSMENT:   CLINICAL IMPRESSION:   Pt with increased pain this date reporting that he feels like his neck is getting worse, which is making the RUE pain worse.   Noted increased frequency of muscle cramping at the neck and distally in the hand during ulnar nerve glide and repetition with [facility] tasks.  Pt continues to require intermittent rest breaks and use of  moist heat to combat muscle cramping.  R grip strength does show improvement  (see above).  Pt reports that he continues to engage the R hand into everything he does at home, but with pain and limited coordination.  Pt will continue  to benefit from skilled OT to address RUE weakness, coordination deficits, stiffness, and pain in the RUE, in order to work towards increased functional use of the R arm for ADL/IADL/work tasks.    PERFORMANCE DEFICITS: in functional skills including ADLs, IADLs, coordination, dexterity, sensation, edema, ROM, strength, pain, fascial restrictions, muscle spasms, flexibility, Fine motor control, body mechanics, decreased knowledge of use of DME, skin integrity, and UE functional use, and psychosocial skills including coping strategies, environmental adaptation, habits, and routines and behaviors.   IMPAIRMENTS: are limiting patient from ADLs, IADLs, rest and sleep, work, and leisure.   COMORBIDITIES: has co-morbidities such as hx of 6 cervical spinal surgeries, bipolar 1, schizophrenia  that affects occupational performance. Patient will benefit from skilled OT to address above impairments and improve overall function.  MODIFICATION OR ASSISTANCE TO COMPLETE EVALUATION: No modification of tasks or assist necessary to complete an evaluation.  OT OCCUPATIONAL PROFILE AND HISTORY: Problem focused assessment: Including review of records relating to presenting problem.  CLINICAL DECISION MAKING: Moderate - several treatment options, min-mod task modification necessary  REHAB POTENTIAL: Good  EVALUATION COMPLEXITY: Moderate      PLAN:  OT FREQUENCY: 1x/week  OT DURATION: 12 weeks  PLANNED INTERVENTIONS: self care/ADL training, therapeutic exercise, therapeutic activity, neuromuscular re-education, manual therapy, scar mobilization, passive range of motion, splinting, electrical stimulation, paraffin, moist heat, cryotherapy, contrast bath, patient/family education,  cognitive remediation/compensation, psychosocial skills training, coping strategies training, and DME and/or AE instructions  RECOMMENDED OTHER SERVICES: None at this time  CONSULTED AND AGREED WITH PLAN OF CARE: Patient  PLAN FOR NEXT SESSION: see above  Danelle Earthly, MS, OTR/L  Otis Dials, OT 04/12/2023, 10:39 AM

## 2023-04-19 ENCOUNTER — Ambulatory Visit: Payer: MEDICAID

## 2023-04-21 ENCOUNTER — Telehealth: Payer: Self-pay

## 2023-04-21 NOTE — Telephone Encounter (Signed)
-----   Message from [facility] sent at 03/23/2023 10:09 AM EST ----- Please check in with him and see if he has stopped using nicotine products.  If he has, he will need a nicotine test and we can schedule him for surgery

## 2023-04-21 NOTE — Telephone Encounter (Signed)
 I spoke with Scott Howell to inquire about his nicotine cessation progress. He immediately deflected the answer to the question and requested an in person appointment with Dr Myer Haff to discuss because "Dr Myer Haff lied".   He states he contacted his insurance company and they told him he does not have to quit smoking to have surgery. I explained that there are many insurance companies that have this rule and he replied that he doesn't care what other insurance companies say, only what his insurance company says. I also explained that there any many reasons why nicotine cessation is needed for the type of surgery he was offered and that I have worked with several neurosurgeons who have the same rule.  Per his request, I scheduled an in person appointment with Dr Myer Haff to discuss further.    ----------------  After our call, I went to the [facility] website.  [name] website states they follow Medicaid guidelines.    I have also located the spine surgery policy on the Medicaid website and printed it to provide at his appointment.

## 2023-04-26 ENCOUNTER — Ambulatory Visit: Payer: MEDICAID | Attending: Neurosurgery

## 2023-04-26 DIAGNOSIS — M6281 Muscle weakness (generalized): Secondary | ICD-10-CM | POA: Diagnosis present

## 2023-04-26 DIAGNOSIS — R278 Other lack of coordination: Secondary | ICD-10-CM | POA: Insufficient documentation

## 2023-04-26 DIAGNOSIS — M25641 Stiffness of right hand, not elsewhere classified: Secondary | ICD-10-CM | POA: Insufficient documentation

## 2023-04-26 DIAGNOSIS — M25631 Stiffness of right wrist, not elsewhere classified: Secondary | ICD-10-CM | POA: Diagnosis present

## 2023-04-26 DIAGNOSIS — M542 Cervicalgia: Secondary | ICD-10-CM | POA: Insufficient documentation

## 2023-04-26 DIAGNOSIS — G5621 Lesion of ulnar nerve, right upper limb: Secondary | ICD-10-CM | POA: Insufficient documentation

## 2023-04-26 DIAGNOSIS — R2681 Unsteadiness on feet: Secondary | ICD-10-CM | POA: Diagnosis present

## 2023-04-26 NOTE — Therapy (Signed)
 OUTPATIENT OCCUPATIONAL THERAPY ORTHO PROGRESS AND TREATMENT NOTE Progress reporting period beginning 02/02/23-04/26/23  Patient Name: Scott Howell MRN: [id number] DOB:11-Oct-1970, 53 y.o., male Today's Date: 04/26/2023  PCP: Dr. Margarita Mail REFERRING PROVIDER: Dr. Ernestine Mcmurray   END OF SESSION:  OT End of Session - 04/26/23 1105     Visit Number 30    Number of Visits 37    Date for OT Re-Evaluation 05/24/23    Authorization Type approved for 1/22-7/21 for 12 OT visits    Authorization Time Period Progress reporting period beginning 02/02/23-04/26/23    Authorization - Visit Number 5    Authorization - Number of Visits 12    Progress Note Due on Visit 10    OT Start Time 1100    OT Stop Time 1145    OT Time Calculation (min) 45 min    Activity Tolerance Patient tolerated treatment well    Behavior During Therapy WFL for tasks assessed/performed            Past Medical History:  Diagnosis Date   Adenomatous polyp of colon    Arthritis    Asthma    Benign fibroma of prostate    Bipolar 1 disorder (HCC)    Cervical myelopathy (HCC)    Cervical post-laminectomy syndrome    Cervical spinal cord compression (HCC)    Chronic knee pain    Chronic pain associated with significant psychosocial dysfunction    Chronic prescription benzodiazepine use    Constipation    COPD (chronic obstructive pulmonary disease) (HCC)    Decreased motor strength    Drug abuse, opioid type (HCC)    Dyspnea    Elevated liver enzymes    Erectile dysfunction    Fibromyalgia    GERD (gastroesophageal reflux disease)    H/O ETOH abuse    Headache    High cholesterol    History of 2019 novel coronavirus disease (COVID-19) 11/01/2021   History of transient cerebral ischemia    Homicidal ideation    Hypertension    Hypokalemia    Living accommodation issues    a.) as of 03/29/2021 --> living in camper   Lower back pain    Lumbar canal stenosis    Marijuana use    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   Pneumonia    Presence of permanent cardiac pacemaker    S/P TKR (total knee replacement)    Schizophrenia (HCC)    Seizures (HCC) 11/2020   Status post hardware removal    Stroke (HCC)    no deficits   Tobacco use    Past Surgical History:  Procedure Laterality Date   ANTERIOR CERVICAL DECOMP/DISCECTOMY FUSION N/A 01/13/2021   Procedure: C3-5 ANTERIOR CERVICAL DECOMPRESSION/DISCECTOMY FUSION;  Surgeon: Venetia Night, MD;  Location: [facility];  Service: Neurosurgery;  Laterality: N/A;   CERVICAL SPINE SURGERY     4   CLAVICLE SURGERY     COLONOSCOPY WITH PROPOFOL N/A 06/29/2022   Procedure: COLONOSCOPY WITH PROPOFOL;  Surgeon: Wyline Mood, MD;  Location: [facility];  Service: Gastroenterology;  Laterality: N/A;  USES MED TRANS; WILL NEED TIME ON FRIDAY, 06/24/2022   HARDWARE REMOVAL Left 04/20/2015   Procedure: HARDWARE REMOVAL left leg;  Surgeon: Deeann Saint, MD;  Location: [facility];  Service: Orthopedics;  Laterality: Left;   KNEE SURGERY Left    NECK SURGERY     x6   SHOULDER SURGERY Left    TOTAL KNEE ARTHROPLASTY Left 04/12/2021  Procedure: TOTAL KNEE ARTHROPLASTY;  Surgeon: Lyndle Herrlich, MD;  Location: [facility];  Service: Orthopedics;  Laterality: Left;   ULNAR NERVE TRANSPOSITION Right 11/17/2021   Procedure: SUBCUTANEOUS TRANSPOSITION OF ULNAR NERVE, RIGHT ELBOW;  Surgeon: Christena Flake, MD;  Location: [facility];  Service: Orthopedics;  Laterality: Right;   ULNAR NERVE TRANSPOSITION Right 10/04/2022   Procedure: ULNAR NERVE TRANSPOSITION;  Surgeon: Lovenia Kim, MD;  Location: [facility];  Service: Neurosurgery;  Laterality: Right;  NO BLOCK   Patient Active Problem List   Diagnosis Date Noted   Ulnar neuropathy of right upper extremity 10/05/2022   Hand weakness 10/05/2022   Nerve pain 10/05/2022   Adenomatous polyp of colon 06/29/2022   S/P TKR (total knee replacement) using cement, left 04/12/2021   Cervical myelopathy (HCC)  01/13/2021   Seizure (HCC) 11/26/2020   Abnormal thyroid blood test 04/06/2016   Encounter for screening colonoscopy 01/05/2016   Abnormal weight gain 01/05/2016   Chronic prescription benzodiazepine use 10/19/2015   Elevated liver enzymes 08/20/2015   Medication monitoring encounter 08/20/2015   Hypokalemia 08/20/2015   Erectile dysfunction 04/22/2015   Status post hardware removal 04/20/2015   Bipolar 2 disorder (HCC) 07/29/2014   Marijuana abuse 07/29/2014   Chronic knee pain 07/29/2014   Bipolar 1 disorder, mixed (HCC)    Drug abuse, opioid type (HCC) 06/28/2013   GERD without esophagitis 10/03/2012   Hypertension goal BP (blood pressure) < 140/90 10/03/2012   Chronic pain associated with significant psychosocial dysfunction 07/17/2012   Lumbar canal stenosis 04/09/2012   Hypercholesterolemia 01/06/2012   Constipation 11/11/2011   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/2010   Decreased motor strength 09/30/2010   Current tobacco use 09/30/2010   H/O transient cerebral ischemia 09/09/2010   Low back pain 08/30/2010   Cervical spinal cord compression (HCC) 07/22/2010   Cervical post-laminectomy syndrome 07/22/2010   Chronic obstructive pulmonary disease (HCC) 02/23/2010   ONSET DATE: 10/04/22 (most recent sx)  REFERRING DIAG: G56.21 (ICD-10-CM) - Ulnar neuropathy of right upper extremity   THERAPY DIAG:  Muscle weakness (generalized)  Other lack of coordination  Stiffness of right hand, not elsewhere classified  Ulnar neuropathy at elbow of right upper extremity  Stiffness of right wrist, not elsewhere classified  Rationale for Evaluation and Treatment: Rehabilitation  SUBJECTIVE:  SUBJECTIVE STATEMENT: Pt reports that he had to cancel his last therapy appointment as his back went out and he had also been sick. Pt accompanied by: self  PERTINENT HISTORY:  Per note from Dr. Katrinka Blazing on 11/16/22: Scott Howell is 2 weeks status post Ulnar nerve decompression, AIN to  ulnar nerve transfer, and ulnar nerve peripheral nerve stimulator placement. Given oxycodone on discharge from the hospital.   Per medical record, pt underwent first ulnar nerve transposition on 11/17/21 and participated in OT post surgery.  2nd ulnar nerve transposition done on 10/04/22.  Pt had nerve stimulator placed but pt reports he does not like how it feels so he does not turn it on.  Pt also with hx of 6 cervical spinal surgeries; see above for additional hx.    PRECAUTIONS: None  RED FLAGS: None   WEIGHT BEARING RESTRICTIONS: No  PAIN: 04/26/23: 6/10 pain RUE d/t being cold in the clinic; in general pt reports 2-4/10 pain with activity in RUE  Are you having pain? Yes: NPRS scale: 2/10 at rest, with activity 6-7/10 Pain location: ulnar side of R arm extending from elbow to hand Pain description: pins and needles, occasional  spasms, numbness Aggravating factors: "my arm doesn't like the cold, increased activity  Relieving factors: heat, rest, Gabapentin  LIVING ENVIRONMENT: Lives with: alone with 1 dog  PLOF: Independent, works as a Scientist, water quality  PATIENT GOALS: build up the strength in the R arm and hand  NEXT MD VISIT:  OBJECTIVE:  Note: Objective measures were completed at Evaluation unless otherwise noted.  HAND DOMINANCE: Right  ADLs: Overall ADLs: Pt reports he always tries to use the R arm as able, but in a limited capacity, and is unable to use the R hand with good accuracy or efficiency. Transfers/ambulation related to ADLs: indep Eating: gross grasp in R hand to hold utensils, difficulty cutting food  Grooming: uses L non-dominant hand to shave  UB Dressing: difficulty with clothing fasteners LB Dressing: difficulty with clothing fasteners, extra time to tie shoe laces  Toileting: uses L non-dominant hand for toilet hygiene Bathing: Pt states that he makes himself use the R hand when bathing but control is limited  FUNCTIONAL OUTCOME MEASURES: FOTO: 50;  predicted with 56 12/26/22: FOTO: 49 02/02/23: FOTO: 50 03/01/23: FOTO: 59  04/26/23: NT; d/c FOTO  UPPER EXTREMITY ROM:     Active ROM Left eval Right eval Right 12/26/22 Right 02/02/23 Right 03/01/23 Right 04/26/23  Shoulder flexion        Shoulder abduction        Shoulder adduction        Shoulder extension        Shoulder internal rotation        Shoulder external rotation        Elbow flexion        Elbow extension        Wrist flexion        Wrist extension 65 58 60 60 60 (Mps flexed) 60 (Mps flexed)  Wrist ulnar deviation WNL -30 -30 -30 ( able to achieve neutral wrist with gravity assisted) -25 -30  Wrist radial deviation WNL WNL WNL WNL    Wrist pronation 90 90 90 90 90 90  Wrist supination 75 70 75 80 80 80  (Blank rows = not tested)  Active ROM Right eval Left eval  Thumb MCP (0-60)    Thumb IP (0-80)    Thumb Radial abd/add (0-55)     Thumb Palmar abd/add (0-45)     Thumb Opposition to Small Finger     Index MCP (0-90)     Index PIP (0-100)     Index DIP (0-70)      Long MCP (0-90)      Long PIP (0-100)      Long DIP (0-70)      Ring MCP (0-90)      Ring PIP (0-100)      Ring DIP (0-70)      Little MCP (0-90)      Little PIP (0-100)      Little DIP (0-70)      (Blank rows = not tested)  Eval: R hand digits limited in PIP extension in 4th and 5th digits with neutral wrist (lacking ~25% ext in 4th digit PIP and 50% ext in 5th digit PIP) 12/26/22: R hand lacking ~10% ext in 4th digit PIP and lacking ~30% ext in 5th digit PIP 02/02/23: R hand lacking ~10% ext in 4th digit PIP and lacking ~30% ext in 5th digit PIP 03/01/23: R hand lacking ~ R finger lacking about 25% ext, 5th digit lacking ~50% ext (feels bad today)  Eval: -  limited active digit abd in 4th and 5th digits 12/26/22: able to achieve slight digit abduction in 4th and 5th digits when compensating with wrist and MP flexion  02/02/23: Same as 12/26/22; fingers can abd on table top but by  radially deviating  03/01/23: Pt can achieve minimal abduction of 4th and 5th digits if wrist is slightly flexed    Eval: -Unable to oppose R 4th and 5th digits to thumb  12/26/22: unable to oppose R 4th and 5th digits to thumb 02/02/23: able to oppose R 4th and 5th digit to thumb, but with increased effort 03/01/23: able to oppose R 4th digit to thumb, able to manage 5th digit if stabilizing digits 2-4 in extension   UPPER EXTREMITY MMT:     MMT Left eval Right eval Right 12/26/22 Right 03/01/23 Right 04/26/23  Shoulder flexion       Shoulder abduction       Shoulder adduction       Shoulder extension       Shoulder internal rotation       Shoulder external rotation       Middle trapezius       Lower trapezius       Elbow flexion 5 5     Elbow extension 5 5     Wrist flexion 5 4 4+ 4+ 5  Wrist extension 5 4+ (simultaneous radial dev) 4+ (slight radial dev) 4+ (slight radial dev) 4+ (slight radial dev)  Wrist ulnar deviation 5 1  2 2   Wrist radial deviation 5 5  5 5   Wrist pronation 5 5  5 5   Wrist supination 5 5  5 5   (Blank rows = not tested)  HAND FUNCTION: Grip strength: Right: 26 lbs; Left: 95 lbs, Lateral pinch: Right: 7 lbs, Left: 32 lbs, and 3 point pinch: Right: 4 lbs, Left: 27 lbs 12/12/22: R grip 35 lbs; R lateral pinch: 8 lbs: 3 point pinch: 4 lbs  12/26/22: R grip 33 lbs; R lateral pinch: 8 lbs; 3 point pinch: 4 lbs (Pt reports increased fatigue in R hand today following extensive use of the hand over the weekend when using a tool) 02/02/23: R grip 32 lbs; R lateral pinch: 4 lbs, 3 point pinch: 4 lbs (Pt reports using tools and shovels extensively over the last 2 days, possibly limited by muscle fatigue from overuse) 03/01/23: R grip 30 lbs (more painful today); L grip 96 lbs; R lateral pinch: R 8 lbs, L 27 lbs; 3 point pinch: R 4 lbs,  L 19 lbs 04/12/23: R grip 38 lbs 04/25/23: R grip 39 lbs, R lateral pinch: R: 8 lbs, R 3 point pinch: 3 lbs  COORDINATION: 9 Hole  Peg test: Right: 2 min 39 sec; Left: 26 sec 12/26/22: R 48 secs 02/02/23: R 51 sec  03/01/23: R 46 sec  04/26/23: R 42 sec  SENSATION: Light touch: Impaired ; ulnar nerve distribution  EDEMA: very mild edema surrounding incision sites on R forearm and elbow   COGNITION: Overall cognitive status: Within functional limits for tasks assessed Areas of impairment:  hx of bipolar 1, schizophrenia, and drug and alcohol abuse  OBSERVATIONS:  Pt pleasant, cooperative, and eager to regain strength in his R dominant arm.  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   Note for education/handouts: Pt reports he is limited with his reading and writing ability, but states he will always ask if he doesn't understand something.  TODAY'S TREATMENT:                                                                                                                              DATE: 04/26/23 Therapeutic Activity: -Objective measures taken and goals updated and reviewed for progress note.  -Promoted R hand FMC/dexterity skills: [employer] social research officer with pegs, washers, and spacers; removed pieces with resistive tweezers (1 trial using a lateral pinch, 1 trial using a 3 point pinch)  PATIENT EDUCATION: Education details: Progress towards goals Person educated: Patient Education method: Explanation Education comprehension: verbalized understanding  HOME EXERCISE PROGRAM: Yellow theraputty, wrist/forearm strengthening with 2# dumbbell, ulnar nerve glides  GOALS: Goals reviewed with patient? Yes  SHORT TERM GOALS: Target date: 01/02/23  Pt will be indep to perform HEP for improving distal RUE flexibility, strength, and coordination. Baseline: Eval: Not yet initiated; 12/26/22: pt routinely works on resistive hand strengthening exercises with tools and putty at home, as well as stretching wrist and digits, and FMC activities.  Goal status: achieved  LONG  TERM GOALS: Target date: 05/24/23  Pt will increase FOTO score to 64 or better to indicate improvement in self perceived functional use of the R arm with daily tasks (revised on 03/01/23 from 56). Baseline: Eval: 50; 12/26/22: 49; 02/02/23: 50; 03/01/23: 59; 04/26/23: d/c FOTO Goal status: d/c  2.  Pt will increase R grip strength by 15 (revised from 10 lbs on 04/26/23) or more lbs to ease ability carry and transport heavy items securely in R dominant hand. Baseline: Eval: R grip 26 lbs (non-dominant L 95 lbs); 12/26/22: R grip 33 lbs; 02/02/23: R grip 32 lbs; 03/01/23: R grip 30 lbs;  04/26/23: R grip 39 lbs Goal status: revised  3.  Pt will increase R lateral pinch strength by 5 or more lbs to ease ability to open drink bottles. Baseline: Eval: R 7 lbs (non-dominant L 32 lbs); 12/26/22: R 8 lbs; 02/02/23: 4 lbs; 03/01/23: R 8 lbs; 04/26/23: R 8 lbs; L hand usually does the twisting to open a juice bottle Goal status: ongoing  4.  Pt will tolerate manual therapy, therapeutic modalities, and exercises to decrease pain in RUE to a reported 2-3/10 pain or less with activity.   Baseline: Eval: R forearm and hand 6-7/10 pain with activity; 12/26/22: Pt reports increased pain today after increased use of R hand over the weekend; still at 6-7/10 pain with activity; 02/02/23: 1/10 pain at rest, 3.5/10 pain with activity; 03/01/23:  2-3/10 pain in the RUE prior to seizure, today reporting 7/10 pain wide spread throughout the body and down RUE, with pt reporting that the last major seizure caused him to hurt for 3 weeks; 04/26/23: 6/10 pain in RUE after feeling cold in therapy clinic; pt reports 2-4/10 pain RUE in general with activity Goal status: revised  5.  Pt will increase R hand FMC/dexterity skills to improve efficiency with clothing fasteners as noted by completion of 9  hole peg test in <40 sec (revised on 12/26/22 from <2 min).  Baseline: Eval: 2 min 39 sec; extra time clothing fasteners, specifically  buttons and tying shoe laces; 12/26/22: R 48 sec; 02/02/23: 51 sec; 03/01/23: R 46 sec; 04/26/23: R 42 sec; pt reports improving efficiency with clothing fasteners, though pulling loops of shoe laces tends to still be a challenge Goal status: ongoing  ASSESSMENT:   CLINICAL IMPRESSION:   Pt seen for 30th visit progress update.  Pt continues to show improvements with R hand FMC/dexterity skills, reporting/and demonstrating improved efficiency with manipulating small objects.  R grip strength is making slow gains, with pt reporting ability to carry anything he wants to, but his hand tends to cramp and he can carry items for only short duration.  R wrist flexion is now 5/5, though flexion occurs with slight radial deviation d/t pt with limited strength in the ulnar nerve distribution throughout the R arm.  Pt has a follow up visit with Dr. Myer Haff tomorrow where cervical spinal surgery will be discussed.  May consider placing pt on hold for OT if pt plans to move forward with surgery, anticipating additional need for therapy services post surgery.  If pt does not undergo surgery, will plan to continue with therapy with focus on FMC/dexterity skills, RUE pain management, and hand strengthening, to work towards meeting goals in OT poc.  Pt will continue to benefit from skilled OT to address RUE weakness, coordination deficits, stiffness, and pain in the RUE, in order to work towards increased functional use of the R arm for ADL/IADL/work tasks.    PERFORMANCE DEFICITS: in functional skills including ADLs, IADLs, coordination, dexterity, sensation, edema, ROM, strength, pain, fascial restrictions, muscle spasms, flexibility, Fine motor control, body mechanics, decreased knowledge of use of DME, skin integrity, and UE functional use, and psychosocial skills including coping strategies, environmental adaptation, habits, and routines and behaviors.   IMPAIRMENTS: are limiting patient from ADLs, IADLs, rest and  sleep, work, and leisure.   COMORBIDITIES: has co-morbidities such as hx of 6 cervical spinal surgeries, bipolar 1, schizophrenia  that affects occupational performance. Patient will benefit from skilled OT to address above impairments and improve overall function.  MODIFICATION OR ASSISTANCE TO COMPLETE EVALUATION: No modification of tasks or assist necessary to complete an evaluation.  OT OCCUPATIONAL PROFILE AND HISTORY: Problem focused assessment: Including review of records relating to presenting problem.  CLINICAL DECISION MAKING: Moderate - several treatment options, min-mod task modification necessary  REHAB POTENTIAL: Good  EVALUATION COMPLEXITY: Moderate      PLAN:  OT FREQUENCY: 1x/week  OT DURATION: 12 weeks  PLANNED INTERVENTIONS: self care/ADL training, therapeutic exercise, therapeutic activity, neuromuscular re-education, manual therapy, scar mobilization, passive range of motion, splinting, electrical stimulation, paraffin, moist heat, cryotherapy, contrast bath, patient/family education, cognitive remediation/compensation, psychosocial skills training, coping strategies training, and DME and/or AE instructions  RECOMMENDED OTHER SERVICES: None at this time  CONSULTED AND AGREED WITH PLAN OF CARE: Patient  PLAN FOR NEXT SESSION: see above  Danelle Earthly, MS, OTR/L  Otis Dials, OT 04/26/2023, 11:40 AM

## 2023-04-27 ENCOUNTER — Ambulatory Visit (INDEPENDENT_AMBULATORY_CARE_PROVIDER_SITE_OTHER): Payer: MEDICAID | Admitting: Neurosurgery

## 2023-04-27 ENCOUNTER — Encounter: Payer: Self-pay | Admitting: Neurosurgery

## 2023-04-27 VITALS — BP 162/104 | Ht 73.0 in | Wt 241.0 lb

## 2023-04-27 DIAGNOSIS — M4802 Spinal stenosis, cervical region: Secondary | ICD-10-CM | POA: Diagnosis not present

## 2023-04-27 DIAGNOSIS — G959 Disease of spinal cord, unspecified: Secondary | ICD-10-CM

## 2023-04-27 NOTE — Progress Notes (Signed)
 Referring Physician:  Margarita Mail, DO [address] [place],  [place] [address]  Primary Physician:  Margarita Mail, DO  History of Present Illness: 04/27/2023 Scott Howell presents today with continued neck pain.  He has cut his tobacco use in half.  03/23/2023 Scott Howell continues to have neck pain.  He has been doing physical therapy for his hand.  He continues to have some issues with his balance.  02/28/2023 Scott Howell is here today with a chief complaint of neck pain.  He is having some problems with his balance.  He had a right ulnar nerve transposition in the summer and is recovering from that.  His hand function has improved.  The symptoms are causing a significant impact on the patient's life.   I have utilized the care everywhere function in epic to review the outside records available from external health systems.  Progress Note from Drake Leach, [place] on 12/26/22:   Scott Howell has a history of seizures, HTN, hyperlipidemia, COPD, bipolar, FM, history of ETOH abuse, and GERD.    He is s/p ulnar nerve decompression, AIN to ulnar nerve transfer, and ulnar nerve peripheral nerve stimulator placement by Dr. Katrinka Blazing on 10/04/22.    His pain was improving at his last visit, but he did not like the feeling of the stimulator, so he stopped using it. Dr. Katrinka Blazing sent him to OT and he was to follow up prn.    History of ACDF C3-C5 for myelopathy on 01/13/21 with Dr. Myer Haff. History of dysfunction in right hand due to history of multiple fractures.    PMR referred him to [facility] for left C7-T1 IL ESI. This has not been done.    He feels like his right hand is improving- he is on OT for this and has appointment later today.    He is now having constant neck pain that radiates to both shoulder blades. No arm pain. Neck pain is sharp and stabbing. Worse when he moves his head. Some relief with heat and resting. He has numbness, tingling, weakness in right  ring and small finger. No numbness, tingling, or weakness in his left hand.     He is taking neurontin, mobic, and zanaflex. He is afraid of needles and doesn't think he will be able to have cervical injection.    Bowel/Bladder Dysfunction: none   He smokes 1 and 1/2 ppd x 30 years.    Conservative measures:  Physical therapy: no recent Multimodal medical therapy including regular antiinflammatories: celebrex, robaxin, oxycodone  Injections: No recent epidural steroid injections   Past Surgery:  History of ACDF C3-C5 for myelopathy on 01/13/21 with Dr. Myer Haff History of 5 previous cervical fusion surgeries  Review of Systems:  A 10 point review of systems is negative, except for the pertinent positives and negatives detailed in the HPI.  Past Medical History: Past Medical History:  Diagnosis Date   Adenomatous polyp of colon    Arthritis    Asthma    Benign fibroma of prostate    Bipolar 1 disorder (HCC)    Cervical myelopathy (HCC)    Cervical post-laminectomy syndrome    Cervical spinal cord compression (HCC)    Chronic knee pain    Chronic pain associated with significant psychosocial dysfunction    Chronic prescription benzodiazepine use    Constipation    COPD (chronic obstructive pulmonary disease) (HCC)    Decreased motor strength    Drug abuse, opioid type (HCC)  Dyspnea    Elevated liver enzymes    Erectile dysfunction    Fibromyalgia    GERD (gastroesophageal reflux disease)    H/O ETOH abuse    Headache    High cholesterol    History of 2019 novel coronavirus disease (COVID-19) 11/01/2021   History of transient cerebral ischemia    Homicidal ideation    Hypertension    Hypokalemia    Living accommodation issues    a.) as of 03/29/2021 --> living in camper   Lower back pain    Lumbar canal stenosis    Marijuana use    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   Pneumonia    Presence of permanent cardiac pacemaker    S/P TKR (total knee  replacement)    Schizophrenia (HCC)    Seizures (HCC) 11/2020   Status post hardware removal    Stroke (HCC)    no deficits   Tobacco use     Past Surgical History: Past Surgical History:  Procedure Laterality Date   ANTERIOR CERVICAL DECOMP/DISCECTOMY FUSION N/A 01/13/2021   Procedure: C3-5 ANTERIOR CERVICAL DECOMPRESSION/DISCECTOMY FUSION;  Surgeon: Venetia Night, MD;  Location: [facility];  Service: Neurosurgery;  Laterality: N/A;   CERVICAL SPINE SURGERY     4   CLAVICLE SURGERY     COLONOSCOPY WITH PROPOFOL N/A 06/29/2022   Procedure: COLONOSCOPY WITH PROPOFOL;  Surgeon: Wyline Mood, MD;  Location: [facility];  Service: Gastroenterology;  Laterality: N/A;  USES MED TRANS; WILL NEED TIME ON FRIDAY, 06/24/2022   HARDWARE REMOVAL Left 04/20/2015   Procedure: HARDWARE REMOVAL left leg;  Surgeon: Deeann Saint, MD;  Location: [facility];  Service: Orthopedics;  Laterality: Left;   KNEE SURGERY Left    NECK SURGERY     x6   SHOULDER SURGERY Left    TOTAL KNEE ARTHROPLASTY Left 04/12/2021   Procedure: TOTAL KNEE ARTHROPLASTY;  Surgeon: Lyndle Herrlich, MD;  Location: [facility];  Service: Orthopedics;  Laterality: Left;   ULNAR NERVE TRANSPOSITION Right 11/17/2021   Procedure: SUBCUTANEOUS TRANSPOSITION OF ULNAR NERVE, RIGHT ELBOW;  Surgeon: Christena Flake, MD;  Location: [facility];  Service: Orthopedics;  Laterality: Right;   ULNAR NERVE TRANSPOSITION Right 10/04/2022   Procedure: ULNAR NERVE TRANSPOSITION;  Surgeon: Lovenia Kim, MD;  Location: [facility];  Service: Neurosurgery;  Laterality: Right;  NO BLOCK    Allergies: Allergies as of 04/27/2023 - Review Complete 04/27/2023  Allergen Reaction Noted   Acetaminophen Other (See Comments) 07/06/2014    Medications:  Current Outpatient Medications:    albuterol (VENTOLIN HFA) 108 (90 Base) MCG/ACT inhaler, INHALE 2 PUFFS INTO LUNGS EVERY 6 HOURS AS NEEDED FOR WHEEZING, Disp: 18 each, Rfl: 2   atorvastatin (LIPITOR) 40 MG  tablet, TAKE 1 TABLET BY MOUTH EVERY DAY, Disp: 90 tablet, Rfl: 1   budesonide-formoterol (SYMBICORT) 160-4.5 MCG/ACT inhaler, Inhale 2 puffs into the lungs 2 (two) times daily., Disp: 1 each, Rfl: 3   divalproex (DEPAKOTE) 250 MG DR tablet, Take 1 tablet (250 mg total) by mouth 3 (three) times daily., Disp: 90 tablet, Rfl: 1   famotidine (PEPCID) 20 MG tablet, Take 1 tablet (20 mg total) by mouth 2 (two) times daily., Disp: 180 tablet, Rfl: 1   gabapentin (NEURONTIN) 300 MG capsule, Take 1 capsule (300 mg total) by mouth 3 (three) times daily., Disp: 270 capsule, Rfl: 3   ipratropium-albuterol (DUONEB) 0.5-2.5 (3) MG/3ML SOLN, Take 3 mLs by nebulization 2 (two) times daily., Disp:  360 mL, Rfl: 1   lisinopril (ZESTRIL) 10 MG tablet, Take 1 tablet (10 mg total) by mouth daily., Disp: 90 tablet, Rfl: 1   QUEtiapine (SEROQUEL) 200 MG tablet, Take 500 mg by mouth at bedtime., Disp: , Rfl:   Social History: Social History   Tobacco Use   Smoking status: Every Day    Current packs/day: 1.50    Average packs/day: 1.5 packs/day for 30.0 years (45.0 ttl pk-yrs)    Types: Cigarettes   Smokeless tobacco: Never  Vaping Use   Vaping status: Never Used  Substance Use Topics   Alcohol use: No    Comment: Sober for 10 yrs.   Drug use: Yes    Frequency: 7.0 times per week    Types: Marijuana    Family Medical History: Family History  Problem Relation Age of Onset   Diabetes Mother    Hyperlipidemia Mother    Hypertension Mother    Diabetes Maternal Aunt    Cancer Maternal Aunt    Hyperlipidemia Maternal Aunt    Hypertension Maternal Aunt    Diabetes Maternal Uncle    Cancer Maternal Uncle    Hyperlipidemia Maternal Uncle    Hypertension Maternal Uncle     Physical Examination: Vitals:   04/27/23 1419  BP: (!) 162/104     General: Patient is in no apparent distress. Attention to examination is appropriate.  Neck:   Supple.  Full range of motion.  Respiratory: Patient is breathing  without any difficulty.   NEUROLOGICAL:     Awake, alert, oriented to person, place, and time.  Speech is clear and fluent.   Cranial Nerves: Pupils equal round and reactive to light.  Facial tone is symmetric.  Facial sensation is symmetric. Shoulder shrug is symmetric. Tongue protrusion is midline.  There is no pronator drift.  Strength: Side Biceps Triceps Deltoid Interossei Grip Wrist Ext. Wrist Flex.  R 5 5 5 3  4- 4 4  L 5 5 5 5 5 5 5    Side Iliopsoas Quads Hamstring PF DF EHL  R 5 5 5 5 5 5   L 5 5 5 5 5 5    Reflexes are 2+ and symmetric at the biceps, triceps, brachioradialis, patella and achilles.   Hoffman's is absent.   Bilateral upper and lower extremity sensation is intact to light touch.    No evidence of dysmetria noted.  Gait is slowed.     Medical Decision Making  Imaging: MRI C spine 08/08/2022 IMPRESSION: 1. Status post interval extension of fusion to C3-C5, with persistent moderate to severe spinal canal stenosis and severe bilateral neural foraminal narrowing at C4-C5. 2. C3-C4 mild-to-moderate left and mild right neural foraminal narrowing, without residual spinal canal stenosis. 3. C5-C6 mild left neural foraminal narrowing, unchanged. 4. C6-C7 moderate left neural foraminal narrowing, unchanged. 5. C7-T1 mild spinal canal stenosis with severe right and moderate to severe left neural foraminal narrowing, unchanged. 6. T2 hyperintense foci in the bilateral anterior horn cells at the level of C6-C7 appear more prominent than on the prior exam but were likely present. This likely represents myelomalacia.     Electronically Signed   By: Wiliam Ke M.D.   On: 08/14/2022 21:29  CT C spine 03/08/2023 FINDINGS: Alignment: Normal.   Skull base and vertebrae: ACDF from C3-C7. There are 2 plates spanning C3-C5 and C5-C7. Solid arthrodesis is seen except at the level of C4-5 where there is scalloping around the lower cage at the C5 superior endplate  and  C5 screw loosening associated with the upper plate. Posterior instrumentation spanning C5-C7 with solid posterior-lateral arthrodesis. No fracture or aggressive bone lesion.   Soft tissues and spinal canal: No evidence of inflammation or mass.   Disc levels: Underestimated compared to prior MRI there is biforaminal impingement at C4-5 from disc height loss and ridging. The other postoperative levels foramina show better patency. Spinal canal better assessed on prior MRI.   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     Electronically Signed   By: Tiburcio Pea M.D.   On: 03/18/2023 07:42  I have personally reviewed the images and agree with the above interpretation.  EMG 07/18/2022 Impression: Abnormal study. There is electrodiagnostic evidence of a chronic, mild right ulnar mononeuropathy.  Thank you for the referral of this patient. It was our privilege to participate in care of your patient. Feel free to contact us with any further questions.  _____________________________ Theora Master, MD   Assessment and Plan: Scott Howell is a pleasant 53 y.o. male with history of cervical myelopathy with cervical stenosis at C4-5.  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  This is the level where he has compression.    I think he is a candidate for intervention to try to establish fusion at C4-5.    He has decreased the amount of nicotine he is using.  I would like to discuss his case with some other surgeons.  We may ultimately utilize bone morphogenetic protein to achieve fusion.  I spent a total of 10 minutes in this patient's care today. This time was spent reviewing pertinent records including imaging studies, obtaining and confirming history, performing a directed evaluation, formulating and discussing my recommendations, and documenting the visit  within the medical record.      Thank you for involving me in the care of this patient.      Kincade Granberg K. Myer Haff MD, [facility]surgery

## 2023-05-03 ENCOUNTER — Ambulatory Visit: Payer: MEDICAID

## 2023-05-03 DIAGNOSIS — M25631 Stiffness of right wrist, not elsewhere classified: Secondary | ICD-10-CM

## 2023-05-03 DIAGNOSIS — M6281 Muscle weakness (generalized): Secondary | ICD-10-CM | POA: Diagnosis not present

## 2023-05-03 DIAGNOSIS — R278 Other lack of coordination: Secondary | ICD-10-CM

## 2023-05-03 DIAGNOSIS — M25641 Stiffness of right hand, not elsewhere classified: Secondary | ICD-10-CM

## 2023-05-03 DIAGNOSIS — M542 Cervicalgia: Secondary | ICD-10-CM

## 2023-05-03 DIAGNOSIS — G5621 Lesion of ulnar nerve, right upper limb: Secondary | ICD-10-CM

## 2023-05-03 DIAGNOSIS — R2681 Unsteadiness on feet: Secondary | ICD-10-CM

## 2023-05-03 NOTE — Therapy (Signed)
 OUTPATIENT OCCUPATIONAL THERAPY ORTHO TREATMENT NOTE  Patient Name: Scott Howell MRN: [id number] DOB:09-08-1970, 53 y.o., male Today's Date: 05/07/2023  PCP: Dr. Margarita Mail REFERRING PROVIDER: Dr. Ernestine Mcmurray   END OF SESSION:  OT End of Session - 05/07/23 1455     Visit Number 31    Number of Visits 37    Date for OT Re-Evaluation 05/24/23    Authorization Type approved for 1/22-7/21 for 12 OT visits    Authorization Time Period Progress reporting period beginning 04/26/23    Authorization - Visit Number 6    Authorization - Number of Visits 12    Progress Note Due on Visit 10    OT Start Time 1400    OT Stop Time 1445    OT Time Calculation (min) 45 min    Activity Tolerance Patient tolerated treatment well    Behavior During Therapy WFL for tasks assessed/performed            Past Medical History:  Diagnosis Date   Adenomatous polyp of colon    Arthritis    Asthma    Benign fibroma of prostate    Bipolar 1 disorder (HCC)    Cervical myelopathy (HCC)    Cervical post-laminectomy syndrome    Cervical spinal cord compression (HCC)    Chronic knee pain    Chronic pain associated with significant psychosocial dysfunction    Chronic prescription benzodiazepine use    Constipation    COPD (chronic obstructive pulmonary disease) (HCC)    Decreased motor strength    Drug abuse, opioid type (HCC)    Dyspnea    Elevated liver enzymes    Erectile dysfunction    Fibromyalgia    GERD (gastroesophageal reflux disease)    H/O ETOH abuse    Headache    High cholesterol    History of 2019 novel coronavirus disease (COVID-19) 11/01/2021   History of transient cerebral ischemia    Homicidal ideation    Hypertension    Hypokalemia    Living accommodation issues    a.) as of 03/29/2021 --> living in camper   Lower back pain    Lumbar canal stenosis    Marijuana use    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   Pneumonia    Presence of permanent cardiac  pacemaker    S/P TKR (total knee replacement)    Schizophrenia (HCC)    Seizures (HCC) 11/2020   Status post hardware removal    Stroke (HCC)    no deficits   Tobacco use    Past Surgical History:  Procedure Laterality Date   ANTERIOR CERVICAL DECOMP/DISCECTOMY FUSION N/A 01/13/2021   Procedure: C3-5 ANTERIOR CERVICAL DECOMPRESSION/DISCECTOMY FUSION;  Surgeon: Venetia Night, MD;  Location: [facility];  Service: Neurosurgery;  Laterality: N/A;   CERVICAL SPINE SURGERY     4   CLAVICLE SURGERY     COLONOSCOPY WITH PROPOFOL N/A 06/29/2022   Procedure: COLONOSCOPY WITH PROPOFOL;  Surgeon: Wyline Mood, MD;  Location: [facility] ENDOSCOPY;  Service: Gastroenterology;  Laterality: N/A;  USES MED TRANS; WILL NEED TIME ON FRIDAY, 06/24/2022   HARDWARE REMOVAL Left 04/20/2015   Procedure: HARDWARE REMOVAL left leg;  Surgeon: Deeann Saint, MD;  Location: [facility];  Service: Orthopedics;  Laterality: Left;   KNEE SURGERY Left    NECK SURGERY     x6   SHOULDER SURGERY Left    TOTAL KNEE ARTHROPLASTY Left 04/12/2021   Procedure: TOTAL KNEE ARTHROPLASTY;  Surgeon:  Lyndle Herrlich, MD;  Location: [facility];  Service: Orthopedics;  Laterality: Left;   ULNAR NERVE TRANSPOSITION Right 11/17/2021   Procedure: SUBCUTANEOUS TRANSPOSITION OF ULNAR NERVE, RIGHT ELBOW;  Surgeon: Christena Flake, MD;  Location: [facility];  Service: Orthopedics;  Laterality: Right;   ULNAR NERVE TRANSPOSITION Right 10/04/2022   Procedure: ULNAR NERVE TRANSPOSITION;  Surgeon: Lovenia Kim, MD;  Location: [facility];  Service: Neurosurgery;  Laterality: Right;  NO BLOCK   Patient Active Problem List   Diagnosis Date Noted   Ulnar neuropathy of right upper extremity 10/05/2022   Hand weakness 10/05/2022   Nerve pain 10/05/2022   Adenomatous polyp of colon 06/29/2022   S/P TKR (total knee replacement) using cement, left 04/12/2021   Cervical myelopathy (HCC) 01/13/2021   Seizure (HCC) 11/26/2020   Abnormal thyroid blood test  04/06/2016   Encounter for screening colonoscopy 01/05/2016   Abnormal weight gain 01/05/2016   Chronic prescription benzodiazepine use 10/19/2015   Elevated liver enzymes 08/20/2015   Medication monitoring encounter 08/20/2015   Hypokalemia 08/20/2015   Erectile dysfunction 04/22/2015   Status post hardware removal 04/20/2015   Bipolar 2 disorder (HCC) 07/29/2014   Marijuana abuse 07/29/2014   Chronic knee pain 07/29/2014   Bipolar 1 disorder, mixed (HCC)    Drug abuse, opioid type (HCC) 06/28/2013   GERD without esophagitis 10/03/2012   Hypertension goal BP (blood pressure) < 140/90 10/03/2012   Chronic pain associated with significant psychosocial dysfunction 07/17/2012   Lumbar canal stenosis 04/09/2012   Hypercholesterolemia 01/06/2012   Constipation 11/11/2011   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/2010   Decreased motor strength 09/30/2010   Current tobacco use 09/30/2010   H/O transient cerebral ischemia 09/09/2010   Low back pain 08/30/2010   Cervical spinal cord compression (HCC) 07/22/2010   Cervical post-laminectomy syndrome 07/22/2010   Chronic obstructive pulmonary disease (HCC) 02/23/2010   ONSET DATE: 10/04/22 (most recent sx)  REFERRING DIAG: G56.21 (ICD-10-CM) - Ulnar neuropathy of right upper extremity   THERAPY DIAG:  Muscle weakness (generalized)  Other lack of coordination  Stiffness of right hand, not elsewhere classified  Ulnar neuropathy at elbow of right upper extremity  Stiffness of right wrist, not elsewhere classified  Rationale for Evaluation and Treatment: Rehabilitation  SUBJECTIVE:  SUBJECTIVE STATEMENT: Pt reports he had a good follow up with his surgeon and he will be waiting to hear back about scheduling a cervical spinal sx.  Pt accompanied by: self  PERTINENT HISTORY:  Per note from Dr. Katrinka Blazing on 11/16/22: [name] is 2 weeks status post Ulnar nerve decompression, AIN to ulnar nerve transfer, and ulnar nerve peripheral nerve  stimulator placement. Given oxycodone on discharge from the hospital.   Per medical record, pt underwent first ulnar nerve transposition on 11/17/21 and participated in OT post surgery.  2nd ulnar nerve transposition done on 10/04/22.  Pt had nerve stimulator placed but pt reports he does not like how it feels so he does not turn it on.  Pt also with hx of 6 cervical spinal surgeries; see above for additional hx.    PRECAUTIONS: None  RED FLAGS: None   WEIGHT BEARING RESTRICTIONS: No  PAIN: 05/03/23: 6/10 spinal pain, 3-4/10 RUE Are you having pain? Yes: NPRS scale: 2/10 at rest, with activity 6-7/10 Pain location: ulnar side of R arm extending from elbow to hand Pain description: pins and needles, occasional spasms, numbness Aggravating factors: "my arm doesn't like the cold, increased activity  Relieving factors: heat, rest,  Gabapentin  LIVING ENVIRONMENT: Lives with: alone with 1 dog  PLOF: Independent, works as a Scientist, water quality  PATIENT GOALS: build up the strength in the R arm and hand  NEXT MD VISIT:  OBJECTIVE:  Note: Objective measures were completed at Evaluation unless otherwise noted.  HAND DOMINANCE: Right  ADLs: Overall ADLs: Pt reports he always tries to use the R arm as able, but in a limited capacity, and is unable to use the R hand with good accuracy or efficiency. Transfers/ambulation related to ADLs: indep Eating: gross grasp in R hand to hold utensils, difficulty cutting food  Grooming: uses L non-dominant hand to shave  UB Dressing: difficulty with clothing fasteners LB Dressing: difficulty with clothing fasteners, extra time to tie shoe laces  Toileting: uses L non-dominant hand for toilet hygiene Bathing: Pt states that he makes himself use the R hand when bathing but control is limited  FUNCTIONAL OUTCOME MEASURES: FOTO: 50; predicted with 56 12/26/22: FOTO: 49 02/02/23: FOTO: 50 03/01/23: FOTO: 59  04/26/23: NT; d/c FOTO  UPPER EXTREMITY ROM:      Active ROM Left eval Right eval Right 12/26/22 Right 02/02/23 Right 03/01/23 Right 04/26/23  Shoulder flexion        Shoulder abduction        Shoulder adduction        Shoulder extension        Shoulder internal rotation        Shoulder external rotation        Elbow flexion        Elbow extension        Wrist flexion        Wrist extension 65 58 60 60 60 (Mps flexed) 60 (Mps flexed)  Wrist ulnar deviation WNL -30 -30 -30 ( able to achieve neutral wrist with gravity assisted) -25 -30  Wrist radial deviation WNL WNL WNL WNL    Wrist pronation 90 90 90 90 90 90  Wrist supination 75 70 75 80 80 80  (Blank rows = not tested)  Active ROM Right eval Left eval  Thumb MCP (0-60)    Thumb IP (0-80)    Thumb Radial abd/add (0-55)     Thumb Palmar abd/add (0-45)     Thumb Opposition to Small Finger     Index MCP (0-90)     Index PIP (0-100)     Index DIP (0-70)      Long MCP (0-90)      Long PIP (0-100)      Long DIP (0-70)      Ring MCP (0-90)      Ring PIP (0-100)      Ring DIP (0-70)      Little MCP (0-90)      Little PIP (0-100)      Little DIP (0-70)      (Blank rows = not tested)  Eval: R hand digits limited in PIP extension in 4th and 5th digits with neutral wrist (lacking ~25% ext in 4th digit PIP and 50% ext in 5th digit PIP) 12/26/22: R hand lacking ~10% ext in 4th digit PIP and lacking ~30% ext in 5th digit PIP 02/02/23: R hand lacking ~10% ext in 4th digit PIP and lacking ~30% ext in 5th digit PIP 03/01/23: R hand lacking ~ R finger lacking about 25% ext, 5th digit lacking ~50% ext (feels bad today)  Eval: -limited active digit abd in 4th and 5th digits 12/26/22: able to achieve slight digit abduction in  4th and 5th digits when compensating with wrist and MP flexion  02/02/23: Same as 12/26/22; fingers can abd on table top but by radially deviating  03/01/23: Pt can achieve minimal abduction of 4th and 5th digits if wrist is slightly flexed    Eval: -Unable to  oppose R 4th and 5th digits to thumb  12/26/22: unable to oppose R 4th and 5th digits to thumb 02/02/23: able to oppose R 4th and 5th digit to thumb, but with increased effort 03/01/23: able to oppose R 4th digit to thumb, able to manage 5th digit if stabilizing digits 2-4 in extension   UPPER EXTREMITY MMT:     MMT Left eval Right eval Right 12/26/22 Right 03/01/23 Right 04/26/23  Shoulder flexion       Shoulder abduction       Shoulder adduction       Shoulder extension       Shoulder internal rotation       Shoulder external rotation       Middle trapezius       Lower trapezius       Elbow flexion 5 5     Elbow extension 5 5     Wrist flexion 5 4 4+ 4+ 5  Wrist extension 5 4+ (simultaneous radial dev) 4+ (slight radial dev) 4+ (slight radial dev) 4+ (slight radial dev)  Wrist ulnar deviation 5 1  2 2   Wrist radial deviation 5 5  5 5   Wrist pronation 5 5  5 5   Wrist supination 5 5  5 5   (Blank rows = not tested)  HAND FUNCTION: Grip strength: Right: 26 lbs; Left: 95 lbs, Lateral pinch: Right: 7 lbs, Left: 32 lbs, and 3 point pinch: Right: 4 lbs, Left: 27 lbs 12/12/22: R grip 35 lbs; R lateral pinch: 8 lbs: 3 point pinch: 4 lbs  12/26/22: R grip 33 lbs; R lateral pinch: 8 lbs; 3 point pinch: 4 lbs (Pt reports increased fatigue in R hand today following extensive use of the hand over the weekend when using a tool) 02/02/23: R grip 32 lbs; R lateral pinch: 4 lbs, 3 point pinch: 4 lbs (Pt reports using tools and shovels extensively over the last 2 days, possibly limited by muscle fatigue from overuse) 03/01/23: R grip 30 lbs (more painful today); L grip 96 lbs; R lateral pinch: R 8 lbs, L 27 lbs; 3 point pinch: R 4 lbs,  L 19 lbs 04/12/23: R grip 38 lbs 04/25/23: R grip 39 lbs, R lateral pinch: R: 8 lbs, R 3 point pinch: 3 lbs  COORDINATION: 9 Hole Peg test: Right: 2 min 39 sec; Left: 26 sec 12/26/22: R 48 secs 02/02/23: R 51 sec  03/01/23: R 46 sec  04/26/23: R 42  sec  SENSATION: Light touch: Impaired ; ulnar nerve distribution  EDEMA: very mild edema surrounding incision sites on R forearm and elbow   COGNITION: Overall cognitive status: Within functional limits for tasks assessed Areas of impairment:  hx of bipolar 1, schizophrenia, and drug and alcohol abuse  OBSERVATIONS:  Pt pleasant, cooperative, and eager to regain strength in his R dominant arm.  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   Note for education/handouts: Pt reports he is limited with his reading and writing ability, but states he will always ask if he doesn't understand something.  TODAY'S TREATMENT:  DATE: 05/03/23 Therapeutic Exercise: Facilitated RUE flexibility exercises: -Completed passive stretching for R wrist and digit ext, ulnar deviation, R forearm sup -AAROM for R wrist and hand: digit abd/add, thumb flex/ext, MP and PIP extension with wrist blocked in neutral, UD, and wrist and digit ext  Neuro re-ed: Facilitated FMC/dexterity in the R hand, working to place ball pegs into pegboard.   -Attempted translatory skills beginning with 2 pegs in palm and moving pegs out of radial side of hand 1 by 1 with supinated forearm, but cramping occurs.  Modified task to work with 1 peg at a time, and OT placing peg between thumb and IF to limit thumb flexion which causes cramping.  -Practiced formulating 3 point pinch to place pegs in/out of pegboard.  PATIENT EDUCATION: Education details: R wrist and hand strengthening and flexibility exercises Person educated: Patient Education method: Explanation, demo Education comprehension: verbalized understanding, demonstrated understanding  HOME EXERCISE PROGRAM: Yellow theraputty, wrist/forearm strengthening with 2# dumbbell, ulnar nerve glides  GOALS: Goals  reviewed with patient? Yes  SHORT TERM GOALS: Target date: 01/02/23  Pt will be indep to perform HEP for improving distal RUE flexibility, strength, and coordination. Baseline: Eval: Not yet initiated; 12/26/22: pt routinely works on resistive hand strengthening exercises with tools and putty at home, as well as stretching wrist and digits, and FMC activities.  Goal status: achieved  LONG TERM GOALS: Target date: 05/24/23  Pt will increase FOTO score to 64 or better to indicate improvement in self perceived functional use of the R arm with daily tasks (revised on 03/01/23 from 56). Baseline: Eval: 50; 12/26/22: 49; 02/02/23: 50; 03/01/23: 59; 04/26/23: d/c FOTO Goal status: d/c  2.  Pt will increase R grip strength by 15 (revised from 10 lbs on 04/26/23) or more lbs to ease ability carry and transport heavy items securely in R dominant hand. Baseline: Eval: R grip 26 lbs (non-dominant L 95 lbs); 12/26/22: R grip 33 lbs; 02/02/23: R grip 32 lbs; 03/01/23: R grip 30 lbs;  04/26/23: R grip 39 lbs Goal status: revised  3.  Pt will increase R lateral pinch strength by 5 or more lbs to ease ability to open drink bottles. Baseline: Eval: R 7 lbs (non-dominant L 32 lbs); 12/26/22: R 8 lbs; 02/02/23: 4 lbs; 03/01/23: R 8 lbs; 04/26/23: R 8 lbs; L hand usually does the twisting to open a juice bottle Goal status: ongoing  4.  Pt will tolerate manual therapy, therapeutic modalities, and exercises to decrease pain in RUE to a reported 2-3/10 pain or less with activity.   Baseline: Eval: R forearm and hand 6-7/10 pain with activity; 12/26/22: Pt reports increased pain today after increased use of R hand over the weekend; still at 6-7/10 pain with activity; 02/02/23: 1/10 pain at rest, 3.5/10 pain with activity; 03/01/23:  2-3/10 pain in the RUE prior to seizure, today reporting 7/10 pain wide spread throughout the body and down RUE, with pt reporting that the last major seizure caused him to hurt for 3 weeks;  04/26/23: 6/10 pain in RUE after feeling cold in therapy clinic; pt reports 2-4/10 pain RUE in general with activity Goal status: revised  5.  Pt will increase R hand FMC/dexterity skills to improve efficiency with clothing fasteners as noted by completion of 9 hole peg test in <40 sec (revised on 12/26/22 from <2 min).  Baseline: Eval: 2 min 39 sec; extra time clothing fasteners, specifically buttons and tying shoe laces; 12/26/22: R 48 sec; 02/02/23: 51  sec; 03/01/23: R 46 sec; 04/26/23: R 42 sec; pt reports improving efficiency with clothing fasteners, though pulling loops of shoe laces tends to still be a challenge Goal status: ongoing  ASSESSMENT:   CLINICAL IMPRESSION:   Pt reports he had a good follow up with his surgeon and he will be waiting to hear back about scheduling a cervical spinal sx.  Pt reporting moderate to high pain levels today down the spine, and minimal-moderate pain throughout the RUE.  Coordination/dexterity skills downgraded as noted above d/t cramping in hand with extreme thumb flexion, specifically when pt is attempting to sweep thumb across the palm to move items in his hand with a supinated forearm.  Pt will continue to benefit from skilled OT to address RUE weakness, coordination deficits, stiffness, and pain in the RUE, in order to work towards increased functional use of the R arm for ADL/IADL/work tasks.  Once sx date is scheduled, will determine plan for placing pt on hold until sx vs continuing to address above noted OT goals.  Pt in agreement with plan.   PERFORMANCE DEFICITS: in functional skills including ADLs, IADLs, coordination, dexterity, sensation, edema, ROM, strength, pain, fascial restrictions, muscle spasms, flexibility, Fine motor control, body mechanics, decreased knowledge of use of DME, skin integrity, and UE functional use, and psychosocial skills including coping strategies, environmental adaptation, habits, and routines and behaviors.   IMPAIRMENTS:  are limiting patient from ADLs, IADLs, rest and sleep, work, and leisure.   COMORBIDITIES: has co-morbidities such as hx of 6 cervical spinal surgeries, bipolar 1, schizophrenia  that affects occupational performance. Patient will benefit from skilled OT to address above impairments and improve overall function.  MODIFICATION OR ASSISTANCE TO COMPLETE EVALUATION: No modification of tasks or assist necessary to complete an evaluation.  OT OCCUPATIONAL PROFILE AND HISTORY: Problem focused assessment: Including review of records relating to presenting problem.  CLINICAL DECISION MAKING: Moderate - several treatment options, min-mod task modification necessary  REHAB POTENTIAL: Good  EVALUATION COMPLEXITY: Moderate      PLAN:  OT FREQUENCY: 1x/week  OT DURATION: 12 weeks  PLANNED INTERVENTIONS: self care/ADL training, therapeutic exercise, therapeutic activity, neuromuscular re-education, manual therapy, scar mobilization, passive range of motion, splinting, electrical stimulation, paraffin, moist heat, cryotherapy, contrast bath, patient/family education, cognitive remediation/compensation, psychosocial skills training, coping strategies training, and DME and/or AE instructions  RECOMMENDED OTHER SERVICES: None at this time  CONSULTED AND AGREED WITH PLAN OF CARE: Patient  PLAN FOR NEXT SESSION: see above  Danelle Earthly, MS, OTR/L  Otis Dials, OT 05/07/2023, 2:56 PM   ..
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
## 2023-05-10 ENCOUNTER — Ambulatory Visit: Payer: MEDICAID

## 2023-05-11 ENCOUNTER — Other Ambulatory Visit: Payer: Self-pay | Admitting: Internal Medicine

## 2023-05-11 DIAGNOSIS — J449 Chronic obstructive pulmonary disease, unspecified: Secondary | ICD-10-CM

## 2023-05-12 NOTE — Telephone Encounter (Signed)
 OV 12/08/22 Requested Prescriptions  Pending Prescriptions Disp Refills   SYMBICORT 160-4.5 MCG/ACT inhaler [Pharmacy Med Name: SYMBICORT 160-4.5 MCG INHALER] 10.2 each 3    Sig: INHALE 2 PUFFS INTO THE LUNGS TWICE A DAY     Pulmonology:  Combination Products Failed - 05/12/2023 10:20 AM      Failed - Valid encounter within last 12 months    Recent Outpatient Visits   None     Future Appointments             In 2 weeks Margarita Mail, DO [place] [facility], [facility]

## 2023-05-15 ENCOUNTER — Ambulatory Visit: Payer: MEDICAID

## 2023-05-17 ENCOUNTER — Ambulatory Visit: Payer: MEDICAID

## 2023-05-17 ENCOUNTER — Ambulatory Visit: Payer: MEDICAID | Attending: Neurosurgery

## 2023-05-17 DIAGNOSIS — M25641 Stiffness of right hand, not elsewhere classified: Secondary | ICD-10-CM | POA: Diagnosis present

## 2023-05-17 DIAGNOSIS — M6281 Muscle weakness (generalized): Secondary | ICD-10-CM | POA: Diagnosis present

## 2023-05-17 DIAGNOSIS — M25631 Stiffness of right wrist, not elsewhere classified: Secondary | ICD-10-CM | POA: Diagnosis present

## 2023-05-17 DIAGNOSIS — R278 Other lack of coordination: Secondary | ICD-10-CM | POA: Diagnosis present

## 2023-05-17 DIAGNOSIS — G5621 Lesion of ulnar nerve, right upper limb: Secondary | ICD-10-CM | POA: Insufficient documentation

## 2023-05-17 NOTE — Therapy (Signed)
 OUTPATIENT OCCUPATIONAL THERAPY ORTHO TREATMENT NOTE  Patient Name: Scott Howell MRN: [id number] DOB:[birth year], 53 y.o., male Today's Date: 05/21/2023  PCP: Dr. Margarita Mail REFERRING PROVIDER: Dr. Ernestine Mcmurray   END OF SESSION:  OT End of Session - 05/21/23 1844     Visit Number 32    Number of Visits 37    Date for OT Re-Evaluation 05/24/23    Authorization Time Period Progress reporting period beginning 04/26/23    Authorization - Visit Number 7    Authorization - Number of Visits 12    Progress Note Due on Visit 10    OT Start Time 0930    OT Stop Time 1015    OT Time Calculation (min) 45 min    Activity Tolerance Patient tolerated treatment well    Behavior During Therapy WFL for tasks assessed/performed             Past Medical History:  Diagnosis Date   Adenomatous polyp of colon    Arthritis    Asthma    Benign fibroma of prostate    Bipolar 1 disorder (HCC)    Cervical myelopathy (HCC)    Cervical post-laminectomy syndrome    Cervical spinal cord compression (HCC)    Chronic knee pain    Chronic pain associated with significant psychosocial dysfunction    Chronic prescription benzodiazepine use    Constipation    COPD (chronic obstructive pulmonary disease) (HCC)    Decreased motor strength    Drug abuse, opioid type (HCC)    Dyspnea    Elevated liver enzymes    Erectile dysfunction    Fibromyalgia    GERD (gastroesophageal reflux disease)    H/O ETOH abuse    Headache    High cholesterol    History of 2019 novel coronavirus disease (COVID-19) 11/01/2021   History of transient cerebral ischemia    Homicidal ideation    Hypertension    Hypokalemia    Living accommodation issues    a.) as of 03/29/2021 --> living in camper   Lower back pain    Lumbar canal stenosis    Marijuana use    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   Pneumonia    Presence of permanent cardiac pacemaker    S/P TKR (total knee replacement)     Schizophrenia (HCC)    Seizures (HCC) 11/2020   Status post hardware removal    Stroke (HCC)    no deficits   Tobacco use    Past Surgical History:  Procedure Laterality Date   ANTERIOR CERVICAL DECOMP/DISCECTOMY FUSION N/A 01/13/2021   Procedure: C3-5 ANTERIOR CERVICAL DECOMPRESSION/DISCECTOMY FUSION;  Surgeon: Venetia Night, MD;  Location: [facility];  Service: Neurosurgery;  Laterality: N/A;   CERVICAL SPINE SURGERY     4   CLAVICLE SURGERY     COLONOSCOPY WITH PROPOFOL N/A 06/29/2022   Procedure: COLONOSCOPY WITH PROPOFOL;  Surgeon: Wyline Mood, MD;  Location: [facility];  Service: Gastroenterology;  Laterality: N/A;  USES MED TRANS; WILL NEED TIME ON FRIDAY, 06/24/2022   HARDWARE REMOVAL Left 04/20/2015   Procedure: HARDWARE REMOVAL left leg;  Surgeon: Deeann Saint, MD;  Location: [facility];  Service: Orthopedics;  Laterality: Left;   KNEE SURGERY Left    NECK SURGERY     x6   SHOULDER SURGERY Left    TOTAL KNEE ARTHROPLASTY Left 04/12/2021   Procedure: TOTAL KNEE ARTHROPLASTY;  Surgeon: Lyndle Herrlich, MD;  Location: [facility];  Service: Orthopedics;  Laterality: Left;   ULNAR NERVE TRANSPOSITION Right 11/17/2021   Procedure: SUBCUTANEOUS TRANSPOSITION OF ULNAR NERVE, RIGHT ELBOW;  Surgeon: Christena Flake, MD;  Location: [facility];  Service: Orthopedics;  Laterality: Right;   ULNAR NERVE TRANSPOSITION Right 10/04/2022   Procedure: ULNAR NERVE TRANSPOSITION;  Surgeon: Lovenia Kim, MD;  Location: [facility];  Service: Neurosurgery;  Laterality: Right;  NO BLOCK   Patient Active Problem List   Diagnosis Date Noted   Ulnar neuropathy of right upper extremity 10/05/2022   Hand weakness 10/05/2022   Nerve pain 10/05/2022   Adenomatous polyp of colon 06/29/2022   S/P TKR (total knee replacement) using cement, left 04/12/2021   Cervical myelopathy (HCC) 01/13/2021   Seizure (HCC) 11/26/2020   Abnormal thyroid blood test 04/06/2016   Encounter for screening colonoscopy  01/05/2016   Abnormal weight gain 01/05/2016   Chronic prescription benzodiazepine use 10/19/2015   Elevated liver enzymes 08/20/2015   Medication monitoring encounter 08/20/2015   Hypokalemia 08/20/2015   Erectile dysfunction 04/22/2015   Status post hardware removal 04/20/2015   Bipolar 2 disorder (HCC) 07/29/2014   Marijuana abuse 07/29/2014   Chronic knee pain 07/29/2014   Bipolar 1 disorder, mixed (HCC)    Drug abuse, opioid type (HCC) 06/28/2013   GERD without esophagitis 10/03/2012   Hypertension goal BP (blood pressure) < 140/90 10/03/2012   Chronic pain associated with significant psychosocial dysfunction 07/17/2012   Lumbar canal stenosis 04/09/2012   Hypercholesterolemia 01/06/2012   Constipation 11/11/2011   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/2010   Decreased motor strength 09/30/2010   Current tobacco use 09/30/2010   H/O transient cerebral ischemia 09/09/2010   Low back pain 08/30/2010   Cervical spinal cord compression (HCC) 07/22/2010   Cervical post-laminectomy syndrome 07/22/2010   Chronic obstructive pulmonary disease (HCC) 02/23/2010   ONSET DATE: 10/04/22 (most recent sx)  REFERRING DIAG: G56.21 (ICD-10-CM) - Ulnar neuropathy of right upper extremity   THERAPY DIAG:  Muscle weakness (generalized)  Other lack of coordination  Stiffness of right hand, not elsewhere classified  Ulnar neuropathy at elbow of right upper extremity  Stiffness of right wrist, not elsewhere classified  Rationale for Evaluation and Treatment: Rehabilitation  SUBJECTIVE:  SUBJECTIVE STATEMENT: Pt reports he does not yet have a surgery date, but would like to continue OT as he is finding benefit to target his RUE function. Pt accompanied by: self  PERTINENT HISTORY:  Per note from Dr. Katrinka Blazing on 11/16/22: [name] is 2 weeks status post Ulnar nerve decompression, AIN to ulnar nerve transfer, and ulnar nerve peripheral nerve stimulator placement. Given oxycodone on  discharge from the hospital.   Per medical record, pt underwent first ulnar nerve transposition on 11/17/21 and participated in OT post surgery.  2nd ulnar nerve transposition done on 10/04/22.  Pt had nerve stimulator placed but pt reports he does not like how it feels so he does not turn it on.  Pt also with hx of 6 cervical spinal surgeries; see above for additional hx.    PRECAUTIONS: None  RED FLAGS: None   WEIGHT BEARING RESTRICTIONS: No  PAIN: 05/17/23: 7-8/10 spinal pain, 3/10 RUE Are you having pain? Yes: NPRS scale: 2/10 at rest, with activity 6-7/10 Pain location: ulnar side of R arm extending from elbow to hand Pain description: pins and needles, occasional spasms, numbness Aggravating factors: "my arm doesn't like the cold, increased activity  Relieving factors: heat, rest, Gabapentin  LIVING ENVIRONMENT: Lives with: alone with 1 dog  PLOF:  Independent, works as a Scientist, water quality  PATIENT GOALS: build up the strength in the R arm and hand  NEXT MD VISIT:  OBJECTIVE:  Note: Objective measures were completed at Evaluation unless otherwise noted.  HAND DOMINANCE: Right  ADLs: Overall ADLs: Pt reports he always tries to use the R arm as able, but in a limited capacity, and is unable to use the R hand with good accuracy or efficiency. Transfers/ambulation related to ADLs: indep Eating: gross grasp in R hand to hold utensils, difficulty cutting food  Grooming: uses L non-dominant hand to shave  UB Dressing: difficulty with clothing fasteners LB Dressing: difficulty with clothing fasteners, extra time to tie shoe laces  Toileting: uses L non-dominant hand for toilet hygiene Bathing: Pt states that he makes himself use the R hand when bathing but control is limited  FUNCTIONAL OUTCOME MEASURES: FOTO: 50; predicted with 56 12/26/22: FOTO: 49 02/02/23: FOTO: 50 03/01/23: FOTO: 59  04/26/23: NT; d/c FOTO  UPPER EXTREMITY ROM:     Active ROM Left eval Right eval  Right 12/26/22 Right 02/02/23 Right 03/01/23 Right 04/26/23  Shoulder flexion        Shoulder abduction        Shoulder adduction        Shoulder extension        Shoulder internal rotation        Shoulder external rotation        Elbow flexion        Elbow extension        Wrist flexion        Wrist extension 65 58 60 60 60 (Mps flexed) 60 (Mps flexed)  Wrist ulnar deviation WNL -30 -30 -30 ( able to achieve neutral wrist with gravity assisted) -25 -30  Wrist radial deviation WNL WNL WNL WNL    Wrist pronation 90 90 90 90 90 90  Wrist supination 75 70 75 80 80 80  (Blank rows = not tested)  Active ROM Right eval Left eval  Thumb MCP (0-60)    Thumb IP (0-80)    Thumb Radial abd/add (0-55)     Thumb Palmar abd/add (0-45)     Thumb Opposition to Small Finger     Index MCP (0-90)     Index PIP (0-100)     Index DIP (0-70)      Long MCP (0-90)      Long PIP (0-100)      Long DIP (0-70)      Ring MCP (0-90)      Ring PIP (0-100)      Ring DIP (0-70)      Little MCP (0-90)      Little PIP (0-100)      Little DIP (0-70)      (Blank rows = not tested)  Eval: R hand digits limited in PIP extension in 4th and 5th digits with neutral wrist (lacking ~25% ext in 4th digit PIP and 50% ext in 5th digit PIP) 12/26/22: R hand lacking ~10% ext in 4th digit PIP and lacking ~30% ext in 5th digit PIP 02/02/23: R hand lacking ~10% ext in 4th digit PIP and lacking ~30% ext in 5th digit PIP 03/01/23: R hand lacking ~ R finger lacking about 25% ext, 5th digit lacking ~50% ext (feels bad today)  Eval: -limited active digit abd in 4th and 5th digits 12/26/22: able to achieve slight digit abduction in 4th and 5th digits when compensating with wrist and MP flexion  02/02/23: Same as 12/26/22; fingers can abd on table top but by radially deviating  03/01/23: Pt can achieve minimal abduction of 4th and 5th digits if wrist is slightly flexed    Eval: -Unable to oppose R 4th and 5th digits to thumb   12/26/22: unable to oppose R 4th and 5th digits to thumb 02/02/23: able to oppose R 4th and 5th digit to thumb, but with increased effort 03/01/23: able to oppose R 4th digit to thumb, able to manage 5th digit if stabilizing digits 2-4 in extension   UPPER EXTREMITY MMT:     MMT Left eval Right eval Right 12/26/22 Right 03/01/23 Right 04/26/23  Shoulder flexion       Shoulder abduction       Shoulder adduction       Shoulder extension       Shoulder internal rotation       Shoulder external rotation       Middle trapezius       Lower trapezius       Elbow flexion 5 5     Elbow extension 5 5     Wrist flexion 5 4 4+ 4+ 5  Wrist extension 5 4+ (simultaneous radial dev) 4+ (slight radial dev) 4+ (slight radial dev) 4+ (slight radial dev)  Wrist ulnar deviation 5 1  2 2   Wrist radial deviation 5 5  5 5   Wrist pronation 5 5  5 5   Wrist supination 5 5  5 5   (Blank rows = not tested)  HAND FUNCTION: Grip strength: Right: 26 lbs; Left: 95 lbs, Lateral pinch: Right: 7 lbs, Left: 32 lbs, and 3 point pinch: Right: 4 lbs, Left: 27 lbs 12/12/22: R grip 35 lbs; R lateral pinch: 8 lbs: 3 point pinch: 4 lbs  12/26/22: R grip 33 lbs; R lateral pinch: 8 lbs; 3 point pinch: 4 lbs (Pt reports increased fatigue in R hand today following extensive use of the hand over the weekend when using a tool) 02/02/23: R grip 32 lbs; R lateral pinch: 4 lbs, 3 point pinch: 4 lbs (Pt reports using tools and shovels extensively over the last 2 days, possibly limited by muscle fatigue from overuse) 03/01/23: R grip 30 lbs (more painful today); L grip 96 lbs; R lateral pinch: R 8 lbs, L 27 lbs; 3 point pinch: R 4 lbs,  L 19 lbs 04/12/23: R grip 38 lbs 04/25/23: R grip 39 lbs, R lateral pinch: R: 8 lbs, R 3 point pinch: 3 lbs  COORDINATION: 9 Hole Peg test: Right: 2 min 39 sec; Left: 26 sec 12/26/22: R 48 secs 02/02/23: R 51 sec  03/01/23: R 46 sec  04/26/23: R 42 sec  SENSATION: Light touch: Impaired ; ulnar  nerve distribution  EDEMA: very mild edema surrounding incision sites on R forearm and elbow   COGNITION: Overall cognitive status: Within functional limits for tasks assessed Areas of impairment:  hx of bipolar 1, schizophrenia, and drug and alcohol abuse  OBSERVATIONS:  Pt pleasant, cooperative, and eager to regain strength in his R dominant arm.  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   Note for education/handouts: Pt reports he is limited with his reading and writing ability, but states he will always ask if he doesn't understand something.  TODAY'S TREATMENT:  DATE: 05/17/23 Therapeutic Exercise: Facilitated RUE flexibility exercises: -Completed passive stretching for R wrist and digit ext, ulnar deviation, R forearm sup -AAROM for R wrist and hand: digit abd/add, thumb flex/ext, MP and PIP extension with wrist blocked in neutral, UD, and wrist and digit ext -R grip strengthening: Hand gripper set at 17.9# x1 trial, 23.4# x1/4 trial, reducing back to 17.9# for a full 2nd trial to remove jumbo pegs from pegboard.    Therapeutic Activity: Facilitated FMC/dexterity in the R hand, working to target item storage and translatory skills using [place] stones.  Facilitated digit flex/ext and pron/sup, with pt scooping multiple stones from dish and discarding from hand.  Practiced 2 pt and 3 pt prehension patterns working to pick up stones from dish and table top.   Manual Therapy: Soft tissue massage performed at the R medial elbow and volar forearm, wrist, and hand to increase circulation, decrease pain, and reduce muscle stiffness in above noted areas.    PATIENT EDUCATION: Education details: R wrist and hand strengthening, flexibility, and coordination exercises Person educated: Patient Education method: Explanation,  demo Education comprehension: verbalized understanding, demonstrated understanding  HOME EXERCISE PROGRAM: Yellow theraputty, wrist/forearm strengthening with 2# dumbbell, ulnar nerve glides  GOALS: Goals reviewed with patient? Yes  SHORT TERM GOALS: Target date: 01/02/23  Pt will be indep to perform HEP for improving distal RUE flexibility, strength, and coordination. Baseline: Eval: Not yet initiated; 12/26/22: pt routinely works on resistive hand strengthening exercises with tools and putty at home, as well as stretching wrist and digits, and FMC activities.  Goal status: achieved  LONG TERM GOALS: Target date: 05/24/23  Pt will increase FOTO score to 64 or better to indicate improvement in self perceived functional use of the R arm with daily tasks (revised on 03/01/23 from 56). Baseline: Eval: 50; 12/26/22: 49; 02/02/23: 50; 03/01/23: 59; 04/26/23: d/c FOTO Goal status: d/c  2.  Pt will increase R grip strength by 15 (revised from 10 lbs on 04/26/23) or more lbs to ease ability carry and transport heavy items securely in R dominant hand. Baseline: Eval: R grip 26 lbs (non-dominant L 95 lbs); 12/26/22: R grip 33 lbs; 02/02/23: R grip 32 lbs; 03/01/23: R grip 30 lbs;  04/26/23: R grip 39 lbs Goal status: revised  3.  Pt will increase R lateral pinch strength by 5 or more lbs to ease ability to open drink bottles. Baseline: Eval: R 7 lbs (non-dominant L 32 lbs); 12/26/22: R 8 lbs; 02/02/23: 4 lbs; 03/01/23: R 8 lbs; 04/26/23: R 8 lbs; L hand usually does the twisting to open a juice bottle Goal status: ongoing  4.  Pt will tolerate manual therapy, therapeutic modalities, and exercises to decrease pain in RUE to a reported 2-3/10 pain or less with activity.   Baseline: Eval: R forearm and hand 6-7/10 pain with activity; 12/26/22: Pt reports increased pain today after increased use of R hand over the weekend; still at 6-7/10 pain with activity; 02/02/23: 1/10 pain at rest, 3.5/10 pain with  activity; 03/01/23:  2-3/10 pain in the RUE prior to seizure, today reporting 7/10 pain wide spread throughout the body and down RUE, with pt reporting that the last major seizure caused him to hurt for 3 weeks; 04/26/23: 6/10 pain in RUE after feeling cold in therapy clinic; pt reports 2-4/10 pain RUE in general with activity Goal status: revised  5.  Pt will increase R hand FMC/dexterity skills to improve efficiency with clothing fasteners as noted by  completion of 9 hole peg test in <40 sec (revised on 12/26/22 from <2 min).  Baseline: Eval: 2 min 39 sec; extra time clothing fasteners, specifically buttons and tying shoe laces; 12/26/22: R 48 sec; 02/02/23: 51 sec; 03/01/23: R 46 sec; 04/26/23: R 42 sec; pt reports improving efficiency with clothing fasteners, though pulling loops of shoe laces tends to still be a challenge Goal status: ongoing  ASSESSMENT:   CLINICAL IMPRESSION:   No surgery date scheduled yet but pt wishes to continue with OT as he continues to find benefit for increasing RUE function.  Pt tolerated tx well with intermittent use of moist heat to distal RUE and elbow for pain management and muscle relaxation.  Pt continues to have intermittent cramping with challenging FMC activities, but cramping resolves quickly with rest, stretching, and moist heat.  R medial elbow continues to be tender, and forearm forearm, wrist, and hand remain stiff.  Pt tolerated soft tissue massage fair, and will benefit from continued manual therapy techniques to reduce this stiffness for improving pain and functional use of the RUE.   PERFORMANCE DEFICITS: in functional skills including ADLs, IADLs, coordination, dexterity, sensation, edema, ROM, strength, pain, fascial restrictions, muscle spasms, flexibility, Fine motor control, body mechanics, decreased knowledge of use of DME, skin integrity, and UE functional use, and psychosocial skills including coping strategies, environmental adaptation, habits, and  routines and behaviors.   IMPAIRMENTS: are limiting patient from ADLs, IADLs, rest and sleep, work, and leisure.   COMORBIDITIES: has co-morbidities such as hx of 6 cervical spinal surgeries, bipolar 1, schizophrenia  that affects occupational performance. Patient will benefit from skilled OT to address above impairments and improve overall function.  MODIFICATION OR ASSISTANCE TO COMPLETE EVALUATION: No modification of tasks or assist necessary to complete an evaluation.  OT OCCUPATIONAL PROFILE AND HISTORY: Problem focused assessment: Including review of records relating to presenting problem.  CLINICAL DECISION MAKING: Moderate - several treatment options, min-mod task modification necessary  REHAB POTENTIAL: Good  EVALUATION COMPLEXITY: Moderate    PLAN:  OT FREQUENCY: 1x/week  OT DURATION: 12 weeks  PLANNED INTERVENTIONS: self care/ADL training, therapeutic exercise, therapeutic activity, neuromuscular re-education, manual therapy, scar mobilization, passive range of motion, splinting, electrical stimulation, paraffin, moist heat, cryotherapy, contrast bath, patient/family education, cognitive remediation/compensation, psychosocial skills training, coping strategies training, and DME and/or AE instructions  RECOMMENDED OTHER SERVICES: None at this time  CONSULTED AND AGREED WITH PLAN OF CARE: Patient  PLAN FOR NEXT SESSION: see above  Danelle Earthly, MS, OTR/L  Otis Dials, OT 05/21/2023, 6:46 PM   ..

## 2023-05-22 ENCOUNTER — Ambulatory Visit: Payer: MEDICAID

## 2023-05-24 ENCOUNTER — Ambulatory Visit: Payer: MEDICAID

## 2023-05-29 ENCOUNTER — Ambulatory Visit: Payer: MEDICAID

## 2023-05-31 ENCOUNTER — Ambulatory Visit: Payer: MEDICAID

## 2023-05-31 DIAGNOSIS — M25641 Stiffness of right hand, not elsewhere classified: Secondary | ICD-10-CM

## 2023-05-31 DIAGNOSIS — M6281 Muscle weakness (generalized): Secondary | ICD-10-CM | POA: Diagnosis not present

## 2023-05-31 DIAGNOSIS — M25631 Stiffness of right wrist, not elsewhere classified: Secondary | ICD-10-CM

## 2023-05-31 DIAGNOSIS — R278 Other lack of coordination: Secondary | ICD-10-CM

## 2023-05-31 DIAGNOSIS — G5621 Lesion of ulnar nerve, right upper limb: Secondary | ICD-10-CM

## 2023-05-31 NOTE — Therapy (Unsigned)
 OUTPATIENT OCCUPATIONAL THERAPY ORTHO TREATMENT NOTE  Patient Name: Scott Howell MRN: [id number] DOB:1970/06/11, 53 y.o., male Today's Date: 05/31/2023  PCP: Dr. Margarita Howell REFERRING PROVIDER: Dr. Ernestine Howell   END OF SESSION:    Past Medical History:  Diagnosis Date   Adenomatous polyp of colon    Arthritis    Asthma    Benign fibroma of prostate    Bipolar 1 disorder (HCC)    Cervical myelopathy (HCC)    Cervical post-laminectomy syndrome    Cervical spinal cord compression (HCC)    Chronic knee pain    Chronic pain associated with significant psychosocial dysfunction    Chronic prescription benzodiazepine use    Constipation    COPD (chronic obstructive pulmonary disease) (HCC)    Decreased motor strength    Drug abuse, opioid type (HCC)    Dyspnea    Elevated liver enzymes    Erectile dysfunction    Fibromyalgia    GERD (gastroesophageal reflux disease)    H/O ETOH abuse    Headache    High cholesterol    History of 2019 novel coronavirus disease (COVID-19) 11/01/2021   History of transient cerebral ischemia    Homicidal ideation    Hypertension    Hypokalemia    Living accommodation issues    a.) as of 03/29/2021 --> living in camper   Lower back pain    Lumbar canal stenosis    Marijuana use    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   Pneumonia    Presence of permanent cardiac pacemaker    S/P TKR (total knee replacement)    Schizophrenia (HCC)    Seizures (HCC) 11/2020   Status post hardware removal    Stroke (HCC)    no deficits   Tobacco use    Past Surgical History:  Procedure Laterality Date   ANTERIOR CERVICAL DECOMP/DISCECTOMY FUSION N/A 01/13/2021   Procedure: C3-5 ANTERIOR CERVICAL DECOMPRESSION/DISCECTOMY FUSION;  Surgeon: Scott Night, MD;  Location: [facility];  Service: Neurosurgery;  Laterality: N/A;   CERVICAL SPINE SURGERY     4   CLAVICLE SURGERY     COLONOSCOPY WITH PROPOFOL N/A 06/29/2022   Procedure:  COLONOSCOPY WITH PROPOFOL;  Surgeon: Scott Mood, MD;  Location: [facility];  Service: Gastroenterology;  Laterality: N/A;  USES MED TRANS; WILL NEED TIME ON FRIDAY, 06/24/2022   HARDWARE REMOVAL Left 04/20/2015   Procedure: HARDWARE REMOVAL left leg;  Surgeon: Scott Saint, MD;  Location: [facility];  Service: Orthopedics;  Laterality: Left;   KNEE SURGERY Left    NECK SURGERY     x6   SHOULDER SURGERY Left    TOTAL KNEE ARTHROPLASTY Left 04/12/2021   Procedure: TOTAL KNEE ARTHROPLASTY;  Surgeon: Scott Herrlich, MD;  Location: [facility];  Service: Orthopedics;  Laterality: Left;   ULNAR NERVE TRANSPOSITION Right 11/17/2021   Procedure: SUBCUTANEOUS TRANSPOSITION OF ULNAR NERVE, RIGHT ELBOW;  Surgeon: Scott Flake, MD;  Location: [facility];  Service: Orthopedics;  Laterality: Right;   ULNAR NERVE TRANSPOSITION Right 10/04/2022   Procedure: ULNAR NERVE TRANSPOSITION;  Surgeon: Scott Kim, MD;  Location: [facility];  Service: Neurosurgery;  Laterality: Right;  NO BLOCK   Patient Active Problem List   Diagnosis Date Noted   Ulnar neuropathy of right upper extremity 10/05/2022   Hand weakness 10/05/2022   Nerve pain 10/05/2022   Adenomatous polyp of colon 06/29/2022   S/P TKR (total knee replacement) using cement, left 04/12/2021  Cervical myelopathy (HCC) 01/13/2021   Seizure (HCC) 11/26/2020   Abnormal thyroid blood test 04/06/2016   Encounter for screening colonoscopy 01/05/2016   Abnormal weight gain 01/05/2016   Chronic prescription benzodiazepine use 10/19/2015   Elevated liver enzymes 08/20/2015   Medication monitoring encounter 08/20/2015   Hypokalemia 08/20/2015   Erectile dysfunction 04/22/2015   Status post hardware removal 04/20/2015   Bipolar 2 disorder (HCC) 07/29/2014   Marijuana abuse 07/29/2014   Chronic knee pain 07/29/2014   Bipolar 1 disorder, mixed (HCC)    Drug abuse, opioid type (HCC) 06/28/2013   GERD without esophagitis 10/03/2012   Hypertension goal BP  (blood pressure) < 140/90 10/03/2012   Chronic pain associated with significant psychosocial dysfunction 07/17/2012   Lumbar canal stenosis 04/09/2012   Hypercholesterolemia 01/06/2012   Constipation 11/11/2011   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/2010   Decreased motor strength 09/30/2010   Current tobacco use 09/30/2010   H/O transient cerebral ischemia 09/09/2010   Low back pain 08/30/2010   Cervical spinal cord compression (HCC) 07/22/2010   Cervical post-laminectomy syndrome 07/22/2010   Chronic obstructive pulmonary disease (HCC) 02/23/2010   ONSET DATE: 10/04/22 (most recent sx)  REFERRING DIAG: G56.21 (ICD-10-CM) - Ulnar neuropathy of right upper extremity   THERAPY DIAG:  No diagnosis found.  Rationale for Evaluation and Treatment: Rehabilitation  SUBJECTIVE:  SUBJECTIVE STATEMENT: Pt reports he does not yet have a surgery date, but would like to continue OT as he is finding benefit to target his RUE function. Pt accompanied by: self  PERTINENT HISTORY:  Per note from Dr. Felipe Howell on 11/16/22: Scott Howell is 2 weeks status post Ulnar nerve decompression, AIN to ulnar nerve transfer, and ulnar nerve peripheral nerve stimulator placement. Given oxycodone on discharge from the hospital.   Per medical record, pt underwent first ulnar nerve transposition on 11/17/21 and participated in OT post surgery.  2nd ulnar nerve transposition done on 10/04/22.  Pt had nerve stimulator placed but pt reports he does not like how it feels so he does not turn it on.  Pt also with hx of 6 cervical spinal surgeries; see above for additional hx.    PRECAUTIONS: None  RED FLAGS: None   WEIGHT BEARING RESTRICTIONS: No  PAIN: 05/31/23: 5/10 distal/ulnar forearm and hand, 6-7/10 pain constant in the neck Are you having pain? Yes: NPRS scale: 2/10 at rest, with activity 6-7/10 Pain location: ulnar side of R arm extending from elbow to hand Pain description: pins and needles, occasional spasms,  numbness Aggravating factors: "my arm doesn't like the cold, increased activity  Relieving factors: heat, rest, Gabapentin  LIVING ENVIRONMENT: Lives with: alone with 1 dog  PLOF: Independent, works as a Scientist, water quality  PATIENT GOALS: build up the strength in the R arm and hand  NEXT MD VISIT:  OBJECTIVE:  Note: Objective measures were completed at Evaluation unless otherwise noted.  HAND DOMINANCE: Right  ADLs: Overall ADLs: Pt reports he always tries to use the R arm as able, but in a limited capacity, and is unable to use the R hand with good accuracy or efficiency. Transfers/ambulation related to ADLs: indep Eating: gross grasp in R hand to hold utensils, difficulty cutting food  Grooming: uses L non-dominant hand to shave  UB Dressing: difficulty with clothing fasteners LB Dressing: difficulty with clothing fasteners, extra time to tie shoe laces  Toileting: uses L non-dominant hand for toilet hygiene Bathing: Pt states that he makes himself use the R hand when bathing but  control is limited  FUNCTIONAL OUTCOME MEASURES: FOTO: 50; predicted with 56 12/26/22: FOTO: 49 02/02/23: FOTO: 50 03/01/23: FOTO: 59  04/26/23: NT; d/c FOTO  UPPER EXTREMITY ROM:     Active ROM Left eval Right eval Right 12/26/22 Right 02/02/23 Right 03/01/23 Right 04/26/23   Shoulder flexion         Shoulder abduction         Shoulder adduction         Shoulder extension         Shoulder internal rotation         Shoulder external rotation         Elbow flexion         Elbow extension         Wrist flexion         Wrist extension 65 58 60 60 60 (Mps flexed) 60 (Mps flexed) 60 (Mps flexed)  Wrist ulnar deviation WNL -30 -30 -30 ( able to achieve neutral wrist with gravity assisted) -25 -30 -30 (P 12)  Wrist radial deviation WNL WNL WNL WNL     Wrist pronation 90 90 90 90 90 90   Wrist supination 75 70 75 80 80 80 82  (Blank rows = not tested)  Active ROM Right eval Left eval  Thumb MCP  (0-60)    Thumb IP (0-80)    Thumb Radial abd/add (0-55)     Thumb Palmar abd/add (0-45)     Thumb Opposition to Small Finger     Index MCP (0-90)     Index PIP (0-100)     Index DIP (0-70)      Long MCP (0-90)      Long PIP (0-100)      Long DIP (0-70)      Ring MCP (0-90)      Ring PIP (0-100)      Ring DIP (0-70)      Little MCP (0-90)      Little PIP (0-100)      Little DIP (0-70)      (Blank rows = not tested)  Eval: R hand digits limited in PIP extension in 4th and 5th digits with neutral wrist (lacking ~25% ext in 4th digit PIP and 50% ext in 5th digit PIP) 12/26/22: R hand lacking ~10% ext in 4th digit PIP and lacking ~30% ext in 5th digit PIP 02/02/23: R hand lacking ~10% ext in 4th digit PIP and lacking ~30% ext in 5th digit PIP 03/01/23: R hand lacking ~ R finger lacking about 25% ext, 5th digit lacking ~50% ext (feels bad today)  Eval: -limited active digit abd in 4th and 5th digits 12/26/22: able to achieve slight digit abduction in 4th and 5th digits when compensating with wrist and MP flexion  02/02/23: Same as 12/26/22; fingers can abd on table top but by radially deviating  03/01/23: Pt can achieve minimal abduction of 4th and 5th digits if wrist is slightly flexed    Eval: -Unable to oppose R 4th and 5th digits to thumb  12/26/22: unable to oppose R 4th and 5th digits to thumb 02/02/23: able to oppose R 4th and 5th digit to thumb, but with increased effort 03/01/23: able to oppose R 4th digit to thumb, able to manage 5th digit if stabilizing digits 2-4 in extension  06/01/23: Able to oppose R 4th digit to thumb, able to manage 5th digit if stabilizing digits 2-4 in extension   UPPER EXTREMITY MMT:  MMT Left eval Right eval Right 12/26/22 Right 03/01/23 Right 04/26/23  Shoulder flexion       Shoulder abduction       Shoulder adduction       Shoulder extension       Shoulder internal rotation       Shoulder external rotation       Middle trapezius        Lower trapezius       Elbow flexion 5 5     Elbow extension 5 5     Wrist flexion 5 4 4+ 4+ 5  Wrist extension 5 4+ (simultaneous radial dev) 4+ (slight radial dev) 4+ (slight radial dev) 4+ (slight radial dev)  Wrist ulnar deviation 5 1  2 2   Wrist radial deviation 5 5  5 5   Wrist pronation 5 5  5 5   Wrist supination 5 5  5 5   (Blank rows = not tested)  HAND FUNCTION: Grip strength: Right: 26 lbs; Left: 95 lbs, Lateral pinch: Right: 7 lbs, Left: 32 lbs, and 3 point pinch: Right: 4 lbs, Left: 27 lbs 12/12/22: R grip 35 lbs; R lateral pinch: 8 lbs: 3 point pinch: 4 lbs  12/26/22: R grip 33 lbs; R lateral pinch: 8 lbs; 3 point pinch: 4 lbs (Pt reports increased fatigue in R hand today following extensive use of the hand over the weekend when using a tool) 02/02/23: R grip 32 lbs; R lateral pinch: 4 lbs, 3 point pinch: 4 lbs (Pt reports using tools and shovels extensively over the last 2 days, possibly limited by muscle fatigue from overuse) 03/01/23: R grip 30 lbs (more painful today); L grip 96 lbs; R lateral pinch: R 8 lbs, L 27 lbs; 3 point pinch: R 4 lbs,  L 19 lbs 04/12/23: R grip 38 lbs 04/25/23: R grip 39 lbs, R lateral pinch: R: 8 lbs, R 3 point pinch: 3 lbs 05/31/23: R grip 35 lbs, R lateral pinch: R: 9 lbs, R 3 point pinch:   COORDINATION: 9 Hole Peg test: Right: 2 min 39 sec; Left: 26 sec 12/26/22: R 48 secs 02/02/23: R 51 sec  03/01/23: R 46 sec  04/26/23: R 42 sec   SENSATION: Light touch: Impaired ; ulnar nerve distribution  EDEMA: very mild edema surrounding incision sites on R forearm and elbow   COGNITION: Overall cognitive status: Within functional limits for tasks assessed Areas of impairment:  hx of bipolar 1, schizophrenia, and drug and alcohol abuse  OBSERVATIONS:  Pt pleasant, cooperative, and eager to regain strength in his R dominant arm.  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   Note for education/handouts:  Pt reports he is limited with his reading and writing ability, but states he will always ask if he doesn't understand something.  TODAY'S TREATMENT:  DATE: 05/17/23 Therapeutic Exercise: Facilitated RUE flexibility exercises: -Completed passive stretching for R wrist and digit ext, ulnar deviation, R forearm sup -AAROM for R wrist and hand: digit abd/add, thumb flex/ext, MP and PIP extension with wrist blocked in neutral, UD, and wrist and digit ext -R grip strengthening: Hand gripper set at 17.9# x1 trial, 23.4# x1/4 trial, reducing back to 17.9# for a full 2nd trial to remove jumbo pegs from pegboard.    Therapeutic Activity: Facilitated FMC/dexterity in the R hand, working to target item storage and translatory skills using [place] stones.  Facilitated digit flex/ext and pron/sup, with pt scooping multiple stones from dish and discarding from hand.  Practiced 2 pt and 3 pt prehension patterns working to pick up stones from dish and table top.   Manual Therapy: Soft tissue massage performed at the R medial elbow and volar forearm, wrist, and hand to increase circulation, decrease pain, and reduce muscle stiffness in above noted areas.    PATIENT EDUCATION: Education details: R wrist and hand strengthening, flexibility, and coordination exercises Person educated: Patient Education method: Explanation, demo Education comprehension: verbalized understanding, demonstrated understanding  HOME EXERCISE PROGRAM: Yellow theraputty, wrist/forearm strengthening with 2# dumbbell, ulnar nerve glides  GOALS: Goals reviewed with patient? Yes  SHORT TERM GOALS: Target date: 01/02/23  Pt will be indep to perform HEP for improving distal RUE flexibility, strength, and coordination. Baseline: Eval: Not yet initiated; 12/26/22: pt routinely works on resistive hand strengthening  exercises with tools and putty at home, as well as stretching wrist and digits, and FMC activities.  Goal status: achieved  LONG TERM GOALS: Target date: 05/24/23  Pt will increase FOTO score to 64 or better to indicate improvement in self perceived functional use of the R arm with daily tasks (revised on 03/01/23 from 56). Baseline: Eval: 50; 12/26/22: 49; 02/02/23: 50; 03/01/23: 59; 04/26/23: d/c FOTO Goal status: d/c  2.  Pt will increase R grip strength by 15 (revised from 10 lbs on 04/26/23) or more lbs to ease ability carry and transport heavy items securely in R dominant hand. Baseline: Eval: R grip 26 lbs (non-dominant L 95 lbs); 12/26/22: R grip 33 lbs; 02/02/23: R grip 32 lbs; 03/01/23: R grip 30 lbs;  04/26/23: R grip 39 lbs Goal status: revised  3.  Pt will increase R lateral pinch strength by 5 or more lbs to ease ability to open drink bottles. Baseline: Eval: R 7 lbs (non-dominant L 32 lbs); 12/26/22: R 8 lbs; 02/02/23: 4 lbs; 03/01/23: R 8 lbs; 04/26/23: R 8 lbs; L hand usually does the twisting to open a juice bottle Goal status: ongoing  4.  Pt will tolerate manual therapy, therapeutic modalities, and exercises to decrease pain in RUE to a reported 2-3/10 pain or less with activity.   Baseline: Eval: R forearm and hand 6-7/10 pain with activity; 12/26/22: Pt reports increased pain today after increased use of R hand over the weekend; still at 6-7/10 pain with activity; 02/02/23: 1/10 pain at rest, 3.5/10 pain with activity; 03/01/23:  2-3/10 pain in the RUE prior to seizure, today reporting 7/10 pain wide spread throughout the body and down RUE, with pt reporting that the last major seizure caused him to hurt for 3 weeks; 04/26/23: 6/10 pain in RUE after feeling cold in therapy clinic; pt reports 2-4/10 pain RUE in general with activity Goal status: revised  5.  Pt will increase R hand FMC/dexterity skills to improve efficiency with clothing fasteners as noted by completion  of 9 hole peg  test in <40 sec (revised on 12/26/22 from <2 min).  Baseline: Eval: 2 min 39 sec; extra time clothing fasteners, specifically buttons and tying shoe laces; 12/26/22: R 48 sec; 02/02/23: 51 sec; 03/01/23: R 46 sec; 04/26/23: R 42 sec; pt reports improving efficiency with clothing fasteners, though pulling loops of shoe laces tends to still be a challenge Goal status: ongoing  ASSESSMENT:   CLINICAL IMPRESSION:   No surgery date scheduled yet but pt wishes to continue with OT as he continues to find benefit for increasing RUE function.  Pt tolerated tx well with intermittent use of moist heat to distal RUE and elbow for pain management and muscle relaxation.  Pt continues to have intermittent cramping with challenging FMC activities, but cramping resolves quickly with rest, stretching, and moist heat.  R medial elbow continues to be tender, and forearm forearm, wrist, and hand remain stiff.  Pt tolerated soft tissue massage fair, and will benefit from continued manual therapy techniques to reduce this stiffness for improving pain and functional use of the RUE.   PERFORMANCE DEFICITS: in functional skills including ADLs, IADLs, coordination, dexterity, sensation, edema, ROM, strength, pain, fascial restrictions, muscle spasms, flexibility, Fine motor control, body mechanics, decreased knowledge of use of DME, skin integrity, and UE functional use, and psychosocial skills including coping strategies, environmental adaptation, habits, and routines and behaviors.   IMPAIRMENTS: are limiting patient from ADLs, IADLs, rest and sleep, work, and leisure.   COMORBIDITIES: has co-morbidities such as hx of 6 cervical spinal surgeries, bipolar 1, schizophrenia  that affects occupational performance. Patient will benefit from skilled OT to address above impairments and improve overall function.  MODIFICATION OR ASSISTANCE TO COMPLETE EVALUATION: No modification of tasks or assist necessary to complete an  evaluation.  OT OCCUPATIONAL PROFILE AND HISTORY: Problem focused assessment: Including review of records relating to presenting problem.  CLINICAL DECISION MAKING: Moderate - several treatment options, min-mod task modification necessary  REHAB POTENTIAL: Good  EVALUATION COMPLEXITY: Moderate    PLAN:  OT FREQUENCY: 1x/week  OT DURATION: 12 weeks  PLANNED INTERVENTIONS: self care/ADL training, therapeutic exercise, therapeutic activity, neuromuscular re-education, manual therapy, scar mobilization, passive range of motion, splinting, electrical stimulation, paraffin, moist heat, cryotherapy, contrast bath, patient/family education, cognitive remediation/compensation, psychosocial skills training, coping strategies training, and DME and/or AE instructions  RECOMMENDED OTHER SERVICES: None at this time  CONSULTED AND AGREED WITH PLAN OF CARE: Patient  PLAN FOR NEXT SESSION: see above  Marcus Sewer, MS, OTR/L  Casandra Claw, OT 05/31/2023, 9:25 AM   ..

## 2023-06-01 ENCOUNTER — Ambulatory Visit (INDEPENDENT_AMBULATORY_CARE_PROVIDER_SITE_OTHER): Payer: MEDICAID | Admitting: Internal Medicine

## 2023-06-01 ENCOUNTER — Encounter: Payer: Self-pay | Admitting: Internal Medicine

## 2023-06-01 VITALS — BP 130/78 | HR 89 | Temp 98.1°F | Resp 16 | Ht 73.0 in | Wt 240.0 lb

## 2023-06-01 DIAGNOSIS — R1084 Generalized abdominal pain: Secondary | ICD-10-CM

## 2023-06-01 DIAGNOSIS — I10 Essential (primary) hypertension: Secondary | ICD-10-CM | POA: Diagnosis not present

## 2023-06-01 DIAGNOSIS — K219 Gastro-esophageal reflux disease without esophagitis: Secondary | ICD-10-CM

## 2023-06-01 DIAGNOSIS — E7800 Pure hypercholesterolemia, unspecified: Secondary | ICD-10-CM | POA: Diagnosis not present

## 2023-06-01 DIAGNOSIS — H9202 Otalgia, left ear: Secondary | ICD-10-CM | POA: Diagnosis not present

## 2023-06-01 DIAGNOSIS — J449 Chronic obstructive pulmonary disease, unspecified: Secondary | ICD-10-CM | POA: Diagnosis not present

## 2023-06-01 MED ORDER — FAMOTIDINE 40 MG PO TABS: 40.0000 mg | ORAL_TABLET | Freq: Every day | ORAL | 1 refills | Status: DC

## 2023-06-01 MED ORDER — ALBUTEROL SULFATE HFA 108 (90 BASE) MCG/ACT IN AERS: INHALATION_SPRAY | RESPIRATORY_TRACT | 2 refills | Status: DC

## 2023-06-01 MED ORDER — LISINOPRIL 10 MG PO TABS: 10.0000 mg | ORAL_TABLET | Freq: Every day | ORAL | 1 refills | Status: DC

## 2023-06-01 MED ORDER — ATORVASTATIN CALCIUM 40 MG PO TABS: 40.0000 mg | ORAL_TABLET | Freq: Every day | ORAL | 1 refills | Status: DC

## 2023-06-01 NOTE — Progress Notes (Signed)
 Established Patient Office Visit  Subjective:  Patient ID: Scott Howell, male    DOB: Oct 31, 1970  Age: 53 y.o. MRN: [id number]  CC:  Chief Complaint  Patient presents with   Medical Management of Chronic Issues    HPI Scott Howell presents for follow up on chronic medical conditions.   Discussed the use of AI scribe software for clinical note transcription with the patient, who gave verbal consent to proceed.  History of Present Illness The patient, with a history of neck fusion surgery, presents with two main concerns. Firstly, he has been experiencing severe abdominal pain, described as a pulling sensation in the stomach, which has been occurring for the last couple of months. The pain is so severe that it can cause him to drop to his knees or lie flat on his back. The pain is associated with a sensation of bloating and hardness in the stomach, and a feeling of fullness. He also reports a foul taste when burping. The pain seems to be exacerbated by physical activity and coughing spells. He denies any changes in bowel habits, and does not report any associated nausea or vomiting.  Secondly, he reports a loss of hearing in the left ear, and a sensation of something being lodged in the ear. He has a history of hardened ear wax, but it has been a long time since he last experienced this. He denies any associated pain or discharge from the ear.  Seizures:  -Now following with Neurology -Currently on Depakote 250 mg BID and Seroquel 300 mg -No recent seizure like activity   Hypertension: -Medications: Lisinopril 10 mg -Checking BP at home (average): No -Denies any SOB, CP, vision changes, LE edema or symptoms of hypotension  HLD: -Medications: Lipitor 40 mg  -Patient is compliant with above medications and reports no side effects.  -Last lipid panel:  Lipid Panel     Component Value Date/Time   CHOL 176 06/08/2022 0913   TRIG 181 (H) 06/08/2022 0913   HDL 38 (L)  06/08/2022 0913   CHOLHDL 4.6 06/08/2022 0913   VLDL 22 11/26/2020 0157   LDLCALC 108 (H) 06/08/2022 0913    COPD: -COPD status: stable -Current medications: Symbicort daily, Duonebs and Albuterol PRN -Satisfied with current treatment: yes -Oxygen use: no -Dyspnea frequency: shortness of breath occasionally, non-exertional  -Cough frequency: smoker's cough daily, white mucus -Rescue inhaler frequency: Currently using Albtuerol once-twice a day  -Limitation of activity:  sometimes -Productive cough: yes -Pneumovax: Up to date -Influenza: Up to Date   Bipolar Disorder: Not on any medications currently, had seen Psychiatry over telemedicine but no longer. Currently on Seroquel.    GERD: -On Pepcid 20 mg but still having bloating and belching  Health Maintenance: -Blood work due -Colon cancer screening: colonoscopy 5/24, repeat in 3 years  Past Medical History:  Diagnosis Date   Adenomatous polyp of colon    Arthritis    Asthma    Benign fibroma of prostate    Bipolar 1 disorder (HCC)    Cervical myelopathy (HCC)    Cervical post-laminectomy syndrome    Cervical spinal cord compression (HCC)    Chronic knee pain    Chronic pain associated with significant psychosocial dysfunction    Chronic prescription benzodiazepine use    Constipation    COPD (chronic obstructive pulmonary disease) (HCC)    Decreased motor strength    Drug abuse, opioid type (HCC)    Dyspnea    Elevated liver enzymes  Erectile dysfunction    Fibromyalgia    GERD (gastroesophageal reflux disease)    H/O ETOH abuse    Headache    High cholesterol    History of 2019 novel coronavirus disease (COVID-19) 11/01/2021   History of transient cerebral ischemia    Homicidal ideation    Hypertension    Hypokalemia    Living accommodation issues    a.) as of 03/29/2021 --> living in camper   Lower back pain    Lumbar canal stenosis    Marijuana use    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    Pneumonia    Presence of permanent cardiac pacemaker    S/P TKR (total knee replacement)    Schizophrenia (HCC)    Seizures (HCC) 11/2020   Status post hardware removal    Stroke (HCC)    no deficits   Tobacco use     Past Surgical History:  Procedure Laterality Date   ANTERIOR CERVICAL DECOMP/DISCECTOMY FUSION N/A 01/13/2021   Procedure: C3-5 ANTERIOR CERVICAL DECOMPRESSION/DISCECTOMY FUSION;  Surgeon: Jodeen Munch, MD;  Location: [facility] OR[facility];  Service: Neurosurgery;  Laterality: N/A;   CERVICAL SPINE SURGERY     4   CLAVICLE SURGERY     COLONOSCOPY WITH PROPOFOL N/A 06/29/2022   Procedure: COLONOSCOPY WITH PROPOFOL;  Surgeon: Luke Salaam, MD;  Location: [facility] ENDOSCOPY;  Service: Gastroenterology;  Laterality: N/A;  USES MED TRANS; WILL NEED TIME ON FRIDAY, 06/24/2022   HARDWARE REMOVAL Left 04/20/2015   Procedure: HARDWARE REMOVAL left leg;  Surgeon: Marlynn Singer, MD;  Location: [facility];  Service: Orthopedics;  Laterality: Left;   KNEE SURGERY Left    NECK SURGERY     x6   SHOULDER SURGERY Left    TOTAL KNEE ARTHROPLASTY Left 04/12/2021   Procedure: TOTAL KNEE ARTHROPLASTY;  Surgeon: Jerlyn Moons, MD;  Location: [facility];  Service: Orthopedics;  Laterality: Left;   ULNAR NERVE TRANSPOSITION Right 11/17/2021   Procedure: SUBCUTANEOUS TRANSPOSITION OF ULNAR NERVE, RIGHT ELBOW;  Surgeon: Elner Hahn, MD;  Location: [facility];  Service: Orthopedics;  Laterality: Right;   ULNAR NERVE TRANSPOSITION Right 10/04/2022   Procedure: ULNAR NERVE TRANSPOSITION;  Surgeon: Carroll Clamp, MD;  Location: [facility];  Service: Neurosurgery;  Laterality: Right;  NO BLOCK    Family History  Problem Relation Age of Onset   Diabetes Mother    Hyperlipidemia Mother    Hypertension Mother    Diabetes Maternal Aunt    Cancer Maternal Aunt    Hyperlipidemia Maternal Aunt    Hypertension Maternal Aunt    Diabetes Maternal Uncle    Cancer Maternal Uncle    Hyperlipidemia Maternal Uncle     Hypertension Maternal Uncle     Social History   Socioeconomic History   Marital status: Single    Spouse name: Not on file   Number of children: Not on file   Years of education: Not on file   Highest education level: Not on file  Occupational History   Not on file  Tobacco Use   Smoking status: Every Day    Current packs/day: 1.50    Average packs/day: 1.5 packs/day for 30.0 years (45.0 ttl pk-yrs)    Types: Cigarettes   Smokeless tobacco: Never  Vaping Use   Vaping status: Never Used  Substance and Sexual Activity   Alcohol use: No    Comment: Sober for 10 yrs.   Drug use: Yes    Frequency: 7.0 times  per week    Types: Marijuana   Sexual activity: Never  Other Topics Concern   Not on file  Social History Narrative   Not on file   Social Drivers of Health   Financial Resource Strain: Not on file  Food Insecurity: Not on file  Transportation Needs: Not on file  Physical Activity: Not on file  Stress: Not on file  Social Connections: Not on file  Intimate Partner Violence: Not on file    Outpatient Medications Prior to Visit  Medication Sig Dispense Refill   albuterol (VENTOLIN HFA) 108 (90 Base) MCG/ACT inhaler INHALE 2 PUFFS INTO LUNGS EVERY 6 HOURS AS NEEDED FOR WHEEZING 18 each 2   atorvastatin (LIPITOR) 40 MG tablet TAKE 1 TABLET BY MOUTH EVERY DAY 90 tablet 1   divalproex (DEPAKOTE) 250 MG DR tablet Take 1 tablet (250 mg total) by mouth 3 (three) times daily. 90 tablet 1   famotidine (PEPCID) 20 MG tablet Take 1 tablet (20 mg total) by mouth 2 (two) times daily. 180 tablet 1   gabapentin (NEURONTIN) 300 MG capsule Take 1 capsule (300 mg total) by mouth 3 (three) times daily. 270 capsule 3   ipratropium-albuterol (DUONEB) 0.5-2.5 (3) MG/3ML SOLN Take 3 mLs by nebulization 2 (two) times daily. 360 mL 1   lisinopril (ZESTRIL) 10 MG tablet Take 1 tablet (10 mg total) by mouth daily. 90 tablet 1   SYMBICORT 160-4.5 MCG/ACT inhaler INHALE 2 PUFFS INTO THE LUNGS  TWICE A DAY 10.2 each 3   QUEtiapine (SEROQUEL) 200 MG tablet Take 500 mg by mouth at bedtime.     No facility-administered medications prior to visit.    Allergies  Allergen Reactions   Acetaminophen Other (See Comments)    Pt states that it makes him hyper.  .     ROS Review of Systems  Constitutional:  Negative for chills and fever.  HENT:  Positive for ear pain.   Respiratory:  Negative for shortness of breath and wheezing.   Cardiovascular:  Negative for chest pain and leg swelling.  Gastrointestinal:  Positive for abdominal pain. Negative for constipation, diarrhea and nausea.  Neurological:  Negative for seizures.      Objective:    Physical Exam Constitutional:      Appearance: Normal appearance.  HENT:     Head: Normocephalic and atraumatic.     Right Ear: Tympanic membrane, ear canal and external ear normal.     Left Ear: Ear canal normal.     Ears:     Comments:  Small cysts at the entrance of the ear canal and a long hair in the ear canal with surrounding erythema     Mouth/Throat:     Mouth: Mucous membranes are moist.     Pharynx: Oropharynx is clear.  Eyes:     Extraocular Movements: Extraocular movements intact.     Conjunctiva/sclera: Conjunctivae normal.     Pupils: Pupils are equal, round, and reactive to light.  Cardiovascular:     Rate and Rhythm: Normal rate and regular rhythm.  Pulmonary:     Effort: Pulmonary effort is normal.     Breath sounds: Normal breath sounds.  Abdominal:     General: Bowel sounds are normal. There is no distension.     Palpations: Abdomen is soft. There is no mass.     Tenderness: There is no abdominal tenderness. There is no guarding or rebound.     Hernia: No hernia is present.  Skin:  General: Skin is warm and dry.  Neurological:     General: No focal deficit present.     Mental Status: He is alert. Mental status is at baseline.  Psychiatric:        Mood and Affect: Mood normal.        Behavior: Behavior  normal.     BP 130/78   Pulse 89   Temp 98.1 F (36.7 C) (Oral)   Resp 16   Ht 6\' 1"  (1.854 m)   Wt 240 lb (108.9 kg)   SpO2 97%   BMI 31.66 kg/m  Wt Readings from Last 3 Encounters:  06/01/23 240 lb (108.9 kg)  04/27/23 241 lb (109.3 kg)  03/23/23 241 lb (109.3 kg)     Health Maintenance Due  Topic Date Due   Zoster Vaccines- Shingrix (1 of 2) Never done   COVID-19 Vaccine (3 - Moderna risk series) 07/18/2019   Lung Cancer Screening  01/08/2020    There are no preventive care reminders to display for this patient.  Lab Results  Component Value Date   TSH 0.70 12/16/2020   Lab Results  Component Value Date   WBC 6.8 06/08/2022   HGB 14.5 06/08/2022   HCT 42.8 06/08/2022   MCV 89.2 06/08/2022   PLT 274 06/08/2022   Lab Results  Component Value Date   NA 140 06/08/2022   K 4.9 06/08/2022   CO2 28 06/08/2022   GLUCOSE 88 06/08/2022   BUN 8 06/08/2022   CREATININE 0.87 06/08/2022   BILITOT 0.2 06/08/2022   ALKPHOS 49 11/25/2020   AST 14 06/08/2022   ALT 11 06/08/2022   PROT 7.2 06/08/2022   ALBUMIN 4.5 11/25/2020   CALCIUM 9.9 06/08/2022   ANIONGAP 5 11/12/2021   EGFR 104 06/08/2022   Lab Results  Component Value Date   CHOL 176 06/08/2022   Lab Results  Component Value Date   HDL 38 (L) 06/08/2022   Lab Results  Component Value Date   LDLCALC 108 (H) 06/08/2022   Lab Results  Component Value Date   TRIG 181 (H) 06/08/2022   Lab Results  Component Value Date   CHOLHDL 4.6 06/08/2022   Lab Results  Component Value Date   HGBA1C 5.8 (H) 11/26/2020      Assessment & Plan:   Assessment & Plan Abdominal Pain Intermittent severe cramping and sensation of abdominal fullness. Differential includes muscular pain, hernia, or other abdominal pathology. Prefers ultrasound over CT due to cost and radiation. - Order abdominal ultrasound to evaluate for hernia or other abdominal pathology. - Increase Pepcid to 40 mg for acid reflux  symptoms.  - famotidine (PEPCID) 40 MG tablet; Take 1 tablet (40 mg total) by mouth daily.  Dispense: 90 tablet; Refill: 1 - US Abdomen Complete; Future  Ear Pain Decreased hearing in the left ear with sensation of obstruction. Examination reveals a white, wiry hair-like structure in the ear canal which was removed with ear irrigation. - Ear Lavage  Asthma Uses Symbicort and requires a refill for albuterol inhaler. No acute symptoms reported. - Refill albuterol inhaler prescription.  - albuterol (VENTOLIN HFA) 108 (90 Base) MCG/ACT inhaler; INHALE 2 PUFFS INTO LUNGS EVERY 6 HOURS AS NEEDED FOR WHEEZING  Dispense: 18 each; Refill: 2  Hypertension goal BP (blood pressure) < 140/90 (Primary): Blood pressure stable here today, no changes made to medications and appropriate refills sent to pharmacy. Labs due.   - CBC w/Diff/Platelet - COMPLETE METABOLIC PANEL WITHOUT GFR - lisinopril (  ZESTRIL) 10 MG tablet; Take 1 tablet (10 mg total) by mouth daily.  Dispense: 90 tablet; Refill: 1  Hypercholesteremia: Recheck lipid panel, refill statin.   - Lipid Profile - atorvastatin (LIPITOR) 40 MG tablet; Take 1 tablet (40 mg total) by mouth daily.  Dispense: 90 tablet; Refill: 1    Follow-up: Return in about 6 months (around 12/01/2023).    Rockney Cid, DO

## 2023-06-02 LAB — CBC WITH DIFFERENTIAL/PLATELET
Absolute Lymphocytes: 2697 {cells}/uL (ref 850–3900)
Absolute Monocytes: 670 {cells}/uL (ref 200–950)
Basophils Absolute: 70 {cells}/uL (ref 0–200)
Basophils Relative: 0.8 %
Eosinophils Absolute: 200 {cells}/uL (ref 15–500)
Eosinophils Relative: 2.3 %
HCT: 41.9 % (ref 38.5–50.0)
Hemoglobin: 14.2 g/dL (ref 13.2–17.1)
MCH: 30.5 pg (ref 27.0–33.0)
MCHC: 33.9 g/dL (ref 32.0–36.0)
MCV: 89.9 fL (ref 80.0–100.0)
MPV: 9.9 fL (ref 7.5–12.5)
Monocytes Relative: 7.7 %
Neutro Abs: 5063 {cells}/uL (ref 1500–7800)
Neutrophils Relative %: 58.2 %
Platelets: 251 10*3/uL (ref 140–400)
RBC: 4.66 10*6/uL (ref 4.20–5.80)
RDW: 13 % (ref 11.0–15.0)
Total Lymphocyte: 31 %
WBC: 8.7 10*3/uL (ref 3.8–10.8)

## 2023-06-02 LAB — COMPLETE METABOLIC PANEL WITHOUT GFR
AG Ratio: 1.7 (calc) (ref 1.0–2.5)
ALT: 15 U/L (ref 9–46)
AST: 15 U/L (ref 10–35)
Albumin: 4.8 g/dL (ref 3.6–5.1)
Alkaline phosphatase (APISO): 53 U/L (ref 35–144)
BUN/Creatinine Ratio: 8 (calc) (ref 6–22)
BUN: 6 mg/dL — ABNORMAL LOW (ref 7–25)
CO2: 27 mmol/L (ref 20–32)
Calcium: 9.6 mg/dL (ref 8.6–10.3)
Chloride: 97 mmol/L — ABNORMAL LOW (ref 98–110)
Creat: 0.72 mg/dL (ref 0.70–1.30)
Globulin: 2.8 g/dL (ref 1.9–3.7)
Glucose, Bld: 79 mg/dL (ref 65–99)
Potassium: 4.4 mmol/L (ref 3.5–5.3)
Sodium: 134 mmol/L — ABNORMAL LOW (ref 135–146)
Total Bilirubin: 0.3 mg/dL (ref 0.2–1.2)
Total Protein: 7.6 g/dL (ref 6.1–8.1)

## 2023-06-02 LAB — LIPID PANEL
Cholesterol: 119 mg/dL (ref <200)
HDL: 41 mg/dL (ref >=40)
LDL Cholesterol (Calc): 58 mg/dL
Non-HDL Cholesterol (Calc): 78 mg/dL (ref <130)
Total CHOL/HDL Ratio: 2.9 (calc) (ref <5.0)
Triglycerides: 113 mg/dL (ref <150)

## 2023-06-05 ENCOUNTER — Ambulatory Visit: Payer: MEDICAID

## 2023-06-07 ENCOUNTER — Ambulatory Visit: Payer: MEDICAID

## 2023-06-07 ENCOUNTER — Telehealth: Payer: Self-pay | Admitting: Neurosurgery

## 2023-06-07 ENCOUNTER — Telehealth: Payer: Self-pay

## 2023-06-07 DIAGNOSIS — M25631 Stiffness of right wrist, not elsewhere classified: Secondary | ICD-10-CM

## 2023-06-07 DIAGNOSIS — G959 Disease of spinal cord, unspecified: Secondary | ICD-10-CM

## 2023-06-07 DIAGNOSIS — M6281 Muscle weakness (generalized): Secondary | ICD-10-CM | POA: Diagnosis not present

## 2023-06-07 DIAGNOSIS — M4802 Spinal stenosis, cervical region: Secondary | ICD-10-CM

## 2023-06-07 DIAGNOSIS — M25641 Stiffness of right hand, not elsewhere classified: Secondary | ICD-10-CM

## 2023-06-07 DIAGNOSIS — R278 Other lack of coordination: Secondary | ICD-10-CM

## 2023-06-07 NOTE — Telephone Encounter (Addendum)
 Marcus Sewer OT at [facility] [phone]  Patient was there this morning for OT. He has intense pain and cramping in his right arm and hand. She is asking if Dr.Yarbrough would be willing to prescribe him a muscle relaxer. He throw up at his last OT appt due to the pain. If Dr.Yarbrough has any questions please feel from to call her.  The patient told her that he had called last week asking for a surgery date. I do not see a telephone call regarding this.

## 2023-06-07 NOTE — Telephone Encounter (Signed)
 OT spoke c Patty at Dr. Chrystal Crape office to inquire about Rx for muscle relaxer to manage pt's severe pain with intermittent cramping/spasms in the RUE. Inquired about whether sx date has been identified. Patty will relay call to Dr. Mont Antis.   Marcus Sewer, MS, OTR/L

## 2023-06-07 NOTE — Telephone Encounter (Signed)
 Patient is calling.  He is asking for a muscle relaxer. I told him that Haskell Linker from OT had also called this morning. Dr.Yarbrough is in surgery all day today but we will call him back as soon as we get a response. He said that was fine but he also wants to ask Dr.Yarbrough if he can have surgery on his neck because his pain is increasing. He can not ride more than 10 minutes in a car before he has severe pain in his neck.

## 2023-06-07 NOTE — Therapy (Signed)
 OUTPATIENT OCCUPATIONAL THERAPY ORTHO TREATMENT NOTE  Patient Name: Scott Howell MRN: [id number] DOB:20-Sep-1970, 53 y.o., male Today's Date: 06/07/2023  PCP: Dr. Rockney Cid REFERRING PROVIDER: Dr. Henderson Lock   END OF SESSION:  OT End of Session - 06/07/23 0946     Visit Number 34    Number of Visits 37    Date for OT Re-Evaluation 08/23/23    Authorization Type approved for 1/22-7/21 for 12 OT visits    Authorization Time Period Progress reporting period beginning 04/26/23    Authorization - Visit Number 9    Authorization - Number of Visits 12    Progress Note Due on Visit 10    OT Start Time 0930    OT Stop Time 1015    OT Time Calculation (min) 45 min    Activity Tolerance Patient tolerated treatment well    Behavior During Therapy WFL for tasks assessed/performed            Past Medical History:  Diagnosis Date   Adenomatous polyp of colon    Arthritis    Asthma    Benign fibroma of prostate    Bipolar 1 disorder (HCC)    Cervical myelopathy (HCC)    Cervical post-laminectomy syndrome    Cervical spinal cord compression (HCC)    Chronic knee pain    Chronic pain associated with significant psychosocial dysfunction    Chronic prescription benzodiazepine use    Constipation    COPD (chronic obstructive pulmonary disease) (HCC)    Decreased motor strength    Drug abuse, opioid type (HCC)    Dyspnea    Elevated liver enzymes    Erectile dysfunction    Fibromyalgia    GERD (gastroesophageal reflux disease)    H/O ETOH abuse    Headache    High cholesterol    History of 2019 novel coronavirus disease (COVID-19) 11/01/2021   History of transient cerebral ischemia    Homicidal ideation    Hypertension    Hypokalemia    Living accommodation issues    a.) as of 03/29/2021 --> living in camper   Lower back pain    Lumbar canal stenosis    Marijuana use    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   Pneumonia    Presence of permanent cardiac  pacemaker    S/P TKR (total knee replacement)    Schizophrenia (HCC)    Seizures (HCC) 11/2020   Status post hardware removal    Stroke (HCC)    no deficits   Tobacco use    Past Surgical History:  Procedure Laterality Date   ANTERIOR CERVICAL DECOMP/DISCECTOMY FUSION N/A 01/13/2021   Procedure: C3-5 ANTERIOR CERVICAL DECOMPRESSION/DISCECTOMY FUSION;  Surgeon: Jodeen Munch, MD;  Location: [facility];  Service: Neurosurgery;  Laterality: N/A;   CERVICAL SPINE SURGERY     4   CLAVICLE SURGERY     COLONOSCOPY WITH PROPOFOL  N/A 06/29/2022   Procedure: COLONOSCOPY WITH PROPOFOL ;  Surgeon: Luke Salaam, MD;  Location: [facility];  Service: Gastroenterology;  Laterality: N/A;  USES MED TRANS; WILL NEED TIME ON FRIDAY, 06/24/2022   HARDWARE REMOVAL Left 04/20/2015   Procedure: HARDWARE REMOVAL left leg;  Surgeon: Marlynn Singer, MD;  Location: [facility];  Service: Orthopedics;  Laterality: Left;   KNEE SURGERY Left    NECK SURGERY     x6   SHOULDER SURGERY Left    TOTAL KNEE ARTHROPLASTY Left 04/12/2021   Procedure: TOTAL KNEE ARTHROPLASTY;  Surgeon:  Jerlyn Moons, MD;  Location: [facility];  Service: Orthopedics;  Laterality: Left;   ULNAR NERVE TRANSPOSITION Right 11/17/2021   Procedure: SUBCUTANEOUS TRANSPOSITION OF ULNAR NERVE, RIGHT ELBOW;  Surgeon: Elner Hahn, MD;  Location: [facility];  Service: Orthopedics;  Laterality: Right;   ULNAR NERVE TRANSPOSITION Right 10/04/2022   Procedure: ULNAR NERVE TRANSPOSITION;  Surgeon: Carroll Clamp, MD;  Location: [facility];  Service: Neurosurgery;  Laterality: Right;  NO BLOCK   Patient Active Problem List   Diagnosis Date Noted   Ulnar neuropathy of right upper extremity 10/05/2022   Hand weakness 10/05/2022   Nerve pain 10/05/2022   Adenomatous polyp of colon 06/29/2022   S/P TKR (total knee replacement) using cement, left 04/12/2021   Cervical myelopathy (HCC) 01/13/2021   Seizure (HCC) 11/26/2020   Abnormal thyroid blood test  04/06/2016   Encounter for screening colonoscopy 01/05/2016   Abnormal weight gain 01/05/2016   Chronic prescription benzodiazepine use 10/19/2015   Elevated liver enzymes 08/20/2015   Medication monitoring encounter 08/20/2015   Hypokalemia 08/20/2015   Erectile dysfunction 04/22/2015   Status post hardware removal 04/20/2015   Bipolar 2 disorder (HCC) 07/29/2014   Marijuana abuse 07/29/2014   Chronic knee pain 07/29/2014   Bipolar 1 disorder, mixed (HCC)    Drug abuse, opioid type (HCC) 06/28/2013   GERD without esophagitis 10/03/2012   Hypertension goal BP (blood pressure) < 140/90 10/03/2012   Chronic pain associated with significant psychosocial dysfunction 07/17/2012   Lumbar canal stenosis 04/09/2012   Hypercholesterolemia 01/06/2012   Constipation 11/11/2011   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/2010   Decreased motor strength 09/30/2010   Current tobacco use 09/30/2010   H/O transient cerebral ischemia 09/09/2010   Low back pain 08/30/2010   Cervical spinal cord compression (HCC) 07/22/2010   Cervical post-laminectomy syndrome 07/22/2010   Chronic obstructive pulmonary disease (HCC) 02/23/2010   ONSET DATE: 10/04/22 (most recent sx)  REFERRING DIAG: G56.21 (ICD-10-CM) - Ulnar neuropathy of right upper extremity   THERAPY DIAG:  Muscle weakness (generalized)  Other lack of coordination  Stiffness of right hand, not elsewhere classified  Stiffness of right wrist, not elsewhere classified  Rationale for Evaluation and Treatment: Rehabilitation  SUBJECTIVE:  SUBJECTIVE STATEMENT: Pt reports using his R arm a lot over the weekend push mowing his big lawn. Pt accompanied by: self  PERTINENT HISTORY:  Per note from Dr. Felipe Horton on 11/16/22: [name] is 2 weeks status post Ulnar nerve decompression, AIN to ulnar nerve transfer, and ulnar nerve peripheral nerve stimulator placement. Given oxycodone  on discharge from the hospital.   Per medical record, pt underwent  first ulnar nerve transposition on 11/17/21 and participated in OT post surgery.  2nd ulnar nerve transposition done on 10/04/22.  Pt had nerve stimulator placed but pt reports he does not like how it feels so he does not turn it on.  Pt also with hx of 6 cervical spinal surgeries; see above for additional hx.    PRECAUTIONS: None  RED FLAGS: None   WEIGHT BEARING RESTRICTIONS: No  PAIN: 06/07/23: 5/10 distal/ulnar forearm, hand, and neck; brief and sudden increases to severe pain with intermittent cramping relieved by passive stretching, rest, and moist heat Are you having pain? Yes: NPRS scale: 2/10 at rest, with activity 6-7/10 Pain location: ulnar side of R arm extending from elbow to hand Pain description: pins and needles, occasional spasms, numbness Aggravating factors: "my arm doesn't like the cold, increased activity  Relieving factors: heat, rest,  Gabapentin   LIVING ENVIRONMENT: Lives with: alone with 1 dog  PLOF: Independent, works as a Scientist, water quality  PATIENT GOALS: build up the strength in the R arm and hand  NEXT MD VISIT:  OBJECTIVE:  Note: Objective measures were completed at Evaluation unless otherwise noted.  HAND DOMINANCE: Right  ADLs: Overall ADLs: Pt reports he always tries to use the R arm as able, but in a limited capacity, and is unable to use the R hand with good accuracy or efficiency. Transfers/ambulation related to ADLs: indep Eating: gross grasp in R hand to hold utensils, difficulty cutting food  Grooming: uses L non-dominant hand to shave  UB Dressing: difficulty with clothing fasteners LB Dressing: difficulty with clothing fasteners, extra time to tie shoe laces  Toileting: uses L non-dominant hand for toilet hygiene Bathing: Pt states that he makes himself use the R hand when bathing but control is limited  FUNCTIONAL OUTCOME MEASURES: FOTO: 50; predicted with 56 12/26/22: FOTO: 49 02/02/23: FOTO: 50 03/01/23: FOTO: 59  04/26/23: NT; d/c  FOTO  UPPER EXTREMITY ROM:     Active ROM Left eval Right eval Right 12/26/22 Right 02/02/23 Right 03/01/23 Right 04/26/23 Right 05/31/23  Shoulder flexion         Shoulder abduction         Shoulder adduction         Shoulder extension         Shoulder internal rotation         Shoulder external rotation         Elbow flexion         Elbow extension         Wrist flexion         Wrist extension 65 58 60 60 60 (Mps flexed) 60 (Mps flexed) 60 (Mps flexed)  Wrist ulnar deviation WNL -30 -30 -30 ( able to achieve neutral wrist with gravity assisted) -25 -30 -30 (P 12)  Wrist radial deviation WNL WNL WNL WNL     Wrist pronation 90 90 90 90 90 90 90  Wrist supination 75 70 75 80 80 80 82  (Blank rows = not tested)  Active ROM Right eval Left eval  Thumb MCP (0-60)    Thumb IP (0-80)    Thumb Radial abd/add (0-55)     Thumb Palmar abd/add (0-45)     Thumb Opposition to Small Finger     Index MCP (0-90)     Index PIP (0-100)     Index DIP (0-70)      Long MCP (0-90)      Long PIP (0-100)      Long DIP (0-70)      Ring MCP (0-90)      Ring PIP (0-100)      Ring DIP (0-70)      Little MCP (0-90)      Little PIP (0-100)      Little DIP (0-70)      (Blank rows = not tested)  Eval: R hand digits limited in PIP extension in 4th and 5th digits with neutral wrist (lacking ~25% ext in 4th digit PIP and 50% ext in 5th digit PIP) 12/26/22: R hand lacking ~10% ext in 4th digit PIP and lacking ~30% ext in 5th digit PIP 02/02/23: R hand lacking ~10% ext in 4th digit PIP and lacking ~30% ext in 5th digit PIP 03/01/23: R hand lacking ~ R finger lacking about 25% ext, 5th digit lacking ~50% ext (feels bad  today)  Eval: -limited active digit abd in 4th and 5th digits 12/26/22: able to achieve slight digit abduction in 4th and 5th digits when compensating with wrist and MP flexion  02/02/23: Same as 12/26/22; fingers can abd on table top but by radially deviating  03/01/23: Pt can achieve  minimal abduction of 4th and 5th digits if wrist is slightly flexed    Eval: -Unable to oppose R 4th and 5th digits to thumb  12/26/22: unable to oppose R 4th and 5th digits to thumb 02/02/23: able to oppose R 4th and 5th digit to thumb, but with increased effort 03/01/23: able to oppose R 4th digit to thumb, able to manage 5th digit if stabilizing digits 2-4 in extension  05/31/23: Able to oppose R 4th digit to thumb, able to manage 5th digit if stabilizing digits 2-4 in extension   UPPER EXTREMITY MMT:     MMT Left eval Right eval Right 12/26/22 Right 03/01/23 Right 04/26/23 Right 05/31/23  Shoulder flexion        Shoulder abduction        Shoulder adduction        Shoulder extension        Shoulder internal rotation        Shoulder external rotation        Middle trapezius        Lower trapezius        Elbow flexion 5 5      Elbow extension 5 5      Wrist flexion 5 4 4+ 4+ 5 5  Wrist extension 5 4+ (simultaneous radial dev) 4+ (slight radial dev) 4+ (slight radial dev) 4+ (slight radial dev) 4+ (slight radial dev)  Wrist ulnar deviation 5 1  2 2 2   Wrist radial deviation 5 5  5 5 5   Wrist pronation 5 5  5 5 5   Wrist supination 5 5  5 5 5   (Blank rows = not tested)  HAND FUNCTION: Grip strength: Right: 26 lbs; Left: 95 lbs, Lateral pinch: Right: 7 lbs, Left: 32 lbs, and 3 point pinch: Right: 4 lbs, Left: 27 lbs 12/12/22: R grip 35 lbs; R lateral pinch: 8 lbs: 3 point pinch: 4 lbs  12/26/22: R grip 33 lbs; R lateral pinch: 8 lbs; 3 point pinch: 4 lbs (Pt reports increased fatigue in R hand today following extensive use of the hand over the weekend when using a tool) 02/02/23: R grip 32 lbs; R lateral pinch: 4 lbs, 3 point pinch: 4 lbs (Pt reports using tools and shovels extensively over the last 2 days, possibly limited by muscle fatigue from overuse) 03/01/23: R grip 30 lbs (more painful today); L grip 96 lbs; R lateral pinch: R 8 lbs, L 27 lbs; 3 point pinch: R 4 lbs,  L 19  lbs 04/12/23: R grip 38 lbs 04/25/23: R grip 39 lbs, R lateral pinch: R: 8 lbs, R 3 point pinch: 3 lbs 05/31/23: R grip 35 lbs, R lateral pinch: R: 9 lbs, R 3 point pinch: 1 lb  COORDINATION: 9 Hole Peg test: Right: 2 min 39 sec; Left: 26 sec 12/26/22: R 48 secs 02/02/23: R 51 sec  03/01/23: R 46 sec  04/26/23: R 42 sec 05/31/23: R 48 sec  SENSATION: Light touch: Impaired ; ulnar nerve distribution  EDEMA: very mild edema surrounding incision sites on R forearm and elbow   COGNITION: Overall cognitive status: Within functional limits for tasks assessed Areas of impairment:  hx of bipolar  1, schizophrenia, and drug and alcohol abuse  OBSERVATIONS:  Pt pleasant, cooperative, and eager to regain strength in his R dominant arm.  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   Note for education/handouts: Pt reports he is limited with his reading and writing ability, but states he will always ask if he doesn't understand something.  TODAY'S TREATMENT:                                                                                                                              DATE: 06/07/23 Therapeutic Exercise: Facilitated RUE flexibility exercises: -Completed passive stretching for R wrist and digit ext, ulnar deviation, R forearm sup; ulnar nerve glide performed passively to maximize end range stretch and optimal form -Performed passive wrist flex/ext and RD/UD providing simultaneous traction at the R wrist joint, elbow extended; passive forearm supination stretch -Passive R thumb flexion -Facilitated R hand pinch strengthening using therapy resistant clothespins (all colors) for 3 trials each pinch type and color: lateral and 3 point pinch, min vc for pinch patterns and minimizing substitution patterns.  -R grip strengthening with hand gripper set at 17.9 lbs for 3 trials to remove jumbo pegs from pegboard.  Rest and passive wrist and digit extension  stretching between trials  Manual Therapy: Soft tissue massage performed at the R medial elbow and volar forearm, wrist, and hand to increase circulation, decrease pain, and reduce muscle stiffness in above noted areas.      PATIENT EDUCATION: Education details: condition management: OT will reach out to neurosurgeon's office re: muscle spasms/ inquire about possible benefit of muscle relaxer Person educated: Patient Education method: Explanation Education comprehension: verbalized understanding  HOME EXERCISE PROGRAM: Yellow theraputty, wrist/forearm strengthening with 2# dumbbell, ulnar nerve glides  GOALS: Goals reviewed with patient? Yes  SHORT TERM GOALS: Target date: 01/02/23  Pt will be indep to perform HEP for improving distal RUE flexibility, strength, and coordination. Baseline: Eval: Not yet initiated; 12/26/22: pt routinely works on resistive hand strengthening exercises with tools and putty at home, as well as stretching wrist and digits, and FMC activities.  Goal status: achieved  LONG TERM GOALS: Target date: 08/23/23  Pt will increase FOTO score to 64 or better to indicate improvement in self perceived functional use of the R arm with daily tasks (revised on 03/01/23 from 56). Baseline: Eval: 50; 12/26/22: 49; 02/02/23: 50; 03/01/23: 59; 04/26/23: d/c FOTO Goal status: d/c  2.  Pt will increase R grip strength by 15 (revised from 10 lbs on 04/26/23) or more lbs to ease ability carry and transport heavy items securely in R dominant hand. Baseline: Eval: R grip 26 lbs (non-dominant L 95 lbs); 12/26/22: R grip 33 lbs; 02/02/23: R grip 32 lbs; 03/01/23: R grip 30 lbs;  04/26/23: R grip 39 lbs; 05/31/23: R grip 35 lbs Goal status: ongoing  3.  Pt will increase R lateral pinch strength by  5 or more lbs to ease ability to open drink bottles. Baseline: Eval: R 7 lbs (non-dominant L 32 lbs); 12/26/22: R 8 lbs; 02/02/23: 4 lbs; 03/01/23: R 8 lbs; 04/26/23: R 8 lbs; L hand usually does  the twisting to open a juice bottle; 05/31/23: R 9 lbs Goal status: ongoing  4.  Pt will tolerate manual therapy, therapeutic modalities, and exercises to decrease pain in RUE to a reported 2-3/10 pain or less with activity.   Baseline: Eval: R forearm and hand 6-7/10 pain with activity; 12/26/22: Pt reports increased pain today after increased use of R hand over the weekend; still at 6-7/10 pain with activity; 02/02/23: 1/10 pain at rest, 3.5/10 pain with activity; 03/01/23:  2-3/10 pain in the RUE prior to seizure, today reporting 7/10 pain wide spread throughout the body and down RUE, with pt reporting that the last major seizure caused him to hurt for 3 weeks; 04/26/23: 6/10 pain in RUE after feeling cold in therapy clinic; pt reports 2-4/10 pain RUE in general with activity; 05/31/23: 5/10 pain in RUE, ulnar side of distal forearm and hand Goal status: ongoing  5.  Pt will increase R hand FMC/dexterity skills to improve efficiency with clothing fasteners as noted by completion of 9 hole peg test in <40 sec (revised on 12/26/22 from <2 min).  Baseline: Eval: 2 min 39 sec; extra time clothing fasteners, specifically buttons and tying shoe laces; 12/26/22: R 48 sec; 02/02/23: 51 sec; 03/01/23: R 46 sec; 04/26/23: R 42 sec; pt reports improving efficiency with clothing fasteners, though pulling loops of shoe laces tends to still be a challenge; 05/31/23: R 48 sec Goal status: ongoing  ASSESSMENT:   CLINICAL IMPRESSION:   No surgery date scheduled yet but pt wishes to continue with OT and requests to plan every other week at 1x per week for remaining 3 authorized visits.  Pt requested this change in order to prolong therapy while awaiting surgery.  OT reinforced importance of diligent stretching at home daily to reduce stiffness in RUE.  Pt verbalized understanding.  Pt reported generalized 5/10 pain in the neck and RUE this date, though noted sudden increases to severe pain in the distal RUE caused by  cramping/muscle spasms during passive thumb flexion and ulnar nerve glides.  Pain from cramping subsides with rest, moist heat, and passive stretching. OT called and spoke with Patty at Dr. Adriane Hora office following pt's OT visit this date to inquire about possible Rx for muscle relaxer to manage pt's cramping, and also inquired about surgery date.  Patty will relay message to Dr. Mont Antis and follow up with pt as indicated.  Pt tolerated soft tissue massage and therapeutic exercises fairly well, aside from intermittent muscle cramping.  Pt will benefit from continued manual therapy techniques and therapeutic exercises to reduce this stiffness, improve pain, and increase strength and flexibility to maximize functional use of the RUE with remaining visits.   PERFORMANCE DEFICITS: in functional skills including ADLs, IADLs, coordination, dexterity, sensation, edema, ROM, strength, pain, fascial restrictions, muscle spasms, flexibility, Fine motor control, body mechanics, decreased knowledge of use of DME, skin integrity, and UE functional use, and psychosocial skills including coping strategies, environmental adaptation, habits, and routines and behaviors.   IMPAIRMENTS: are limiting patient from ADLs, IADLs, rest and sleep, work, and leisure.   COMORBIDITIES: has co-morbidities such as hx of 6 cervical spinal surgeries, bipolar 1, schizophrenia  that affects occupational performance. Patient will benefit from skilled OT to address above impairments and  improve overall function.  MODIFICATION OR ASSISTANCE TO COMPLETE EVALUATION: No modification of tasks or assist necessary to complete an evaluation.  OT OCCUPATIONAL PROFILE AND HISTORY: Problem focused assessment: Including review of records relating to presenting problem.  CLINICAL DECISION MAKING: Moderate - several treatment options, min-mod task modification necessary  REHAB POTENTIAL: Good  EVALUATION COMPLEXITY: Moderate    PLAN:  OT  FREQUENCY: 1x/week  OT DURATION: 12 weeks  PLANNED INTERVENTIONS: self care/ADL training, therapeutic exercise, therapeutic activity, neuromuscular re-education, manual therapy, scar mobilization, passive range of motion, splinting, electrical stimulation, paraffin, moist heat, cryotherapy, contrast bath, patient/family education, cognitive remediation/compensation, psychosocial skills training, coping strategies training, and DME and/or AE instructions  RECOMMENDED OTHER SERVICES: None at this time  CONSULTED AND AGREED WITH PLAN OF CARE: Patient  PLAN FOR NEXT SESSION: see above  Marcus Sewer, MS, OTR/L  Casandra Claw, OT 06/07/2023, 10:33 AM   ..

## 2023-06-09 ENCOUNTER — Ambulatory Visit
Admission: RE | Admit: 2023-06-09 | Discharge: 2023-06-09 | Disposition: A | Discharge status: Home / Self Care | Payer: MEDICAID | Source: Ambulatory Visit | Attending: Internal Medicine | Admitting: Internal Medicine

## 2023-06-09 DIAGNOSIS — R1084 Generalized abdominal pain: Secondary | ICD-10-CM | POA: Insufficient documentation

## 2023-06-12 ENCOUNTER — Ambulatory Visit: Payer: MEDICAID

## 2023-06-14 ENCOUNTER — Ambulatory Visit: Payer: MEDICAID

## 2023-06-19 ENCOUNTER — Ambulatory Visit: Payer: MEDICAID

## 2023-06-19 NOTE — Telephone Encounter (Signed)
 Scott Howell said Scott Howell called today to follow up on this message. It doesn't look like anyone saw the original message on 06/07/23.

## 2023-06-20 MED ORDER — METHOCARBAMOL 500 MG PO TABS: 500.0000 mg | ORAL_TABLET | Freq: Four times a day (QID) | ORAL | 0 refills | Status: DC | PRN

## 2023-06-20 NOTE — Addendum Note (Signed)
 Addended by: Rosmary Dionisio on: 06/20/2023 05:01 PM   Modules accepted: Orders

## 2023-06-20 NOTE — Telephone Encounter (Signed)
 Orders placed as verbal from Dr. Mont Antis. Patient notified by voicemail advised to call back in morning with any questions

## 2023-06-21 ENCOUNTER — Ambulatory Visit: Payer: MEDICAID | Attending: Neurosurgery

## 2023-06-21 ENCOUNTER — Ambulatory Visit: Payer: MEDICAID

## 2023-06-21 DIAGNOSIS — M25641 Stiffness of right hand, not elsewhere classified: Secondary | ICD-10-CM | POA: Diagnosis present

## 2023-06-21 DIAGNOSIS — R278 Other lack of coordination: Secondary | ICD-10-CM | POA: Insufficient documentation

## 2023-06-21 DIAGNOSIS — M6281 Muscle weakness (generalized): Secondary | ICD-10-CM | POA: Diagnosis present

## 2023-06-21 DIAGNOSIS — G5621 Lesion of ulnar nerve, right upper limb: Secondary | ICD-10-CM | POA: Insufficient documentation

## 2023-06-21 DIAGNOSIS — M25631 Stiffness of right wrist, not elsewhere classified: Secondary | ICD-10-CM | POA: Diagnosis present

## 2023-06-21 NOTE — Telephone Encounter (Signed)
 I called Scott Howell.   He asked about his surgery and what we can do about moving things forward.   I spoke to him about the need to quit using nicotine  for this surgery as per Dr. Mont Antis, it is a standard of care for the procedure he'd need to have.   Patient states that he is in pain and needs surgery. Also that his insurance does not require stopping nicotine  for that surgery.   I reiterated the above as discussed with Dr. Mont Antis.   He asked if he could get back to his rehab. I apologized as I did not know he was currently in treatment.   I told him that I would see if we could work on scheduling an appointment as he wants to talk to Dr. Mont Antis about this.

## 2023-06-21 NOTE — Therapy (Signed)
 OUTPATIENT OCCUPATIONAL THERAPY ORTHO TREATMENT NOTE  Patient Name: Scott Howell MRN: [id number] DOB:11/16/1970, 53 y.o., male Today's Date: 06/21/2023  PCP: Dr. Rockney Cid REFERRING PROVIDER: Dr. Henderson Lock   END OF SESSION:  OT End of Session - 06/21/23 1336     Visit Number 35    Number of Visits 37    Date for OT Re-Evaluation 08/23/23    Authorization Type approved for 1/22-7/21 for 12 OT visits    Authorization Time Period Progress reporting period beginning 04/26/23    Authorization - Visit Number 10    Authorization - Number of Visits 12    Progress Note Due on Visit 40    OT Start Time 0930    OT Stop Time 1015    OT Time Calculation (min) 45 min    Activity Tolerance Patient tolerated treatment well    Behavior During Therapy WFL for tasks assessed/performed            Past Medical History:  Diagnosis Date   Adenomatous polyp of colon    Arthritis    Asthma    Benign fibroma of prostate    Bipolar 1 disorder (HCC)    Cervical myelopathy (HCC)    Cervical post-laminectomy syndrome    Cervical spinal cord compression (HCC)    Chronic knee pain    Chronic pain associated with significant psychosocial dysfunction    Chronic prescription benzodiazepine use    Constipation    COPD (chronic obstructive pulmonary disease) (HCC)    Decreased motor strength    Drug abuse, opioid type (HCC)    Dyspnea    Elevated liver enzymes    Erectile dysfunction    Fibromyalgia    GERD (gastroesophageal reflux disease)    H/O ETOH abuse    Headache    High cholesterol    History of 2019 novel coronavirus disease (COVID-19) 11/01/2021   History of transient cerebral ischemia    Homicidal ideation    Hypertension    Hypokalemia    Living accommodation issues    a.) as of 03/29/2021 --> living in camper   Lower back pain    Lumbar canal stenosis    Marijuana use    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   Pneumonia    Presence of permanent cardiac  pacemaker    S/P TKR (total knee replacement)    Schizophrenia (HCC)    Seizures (HCC) 11/2020   Status post hardware removal    Stroke (HCC)    no deficits   Tobacco use    Past Surgical History:  Procedure Laterality Date   ANTERIOR CERVICAL DECOMP/DISCECTOMY FUSION N/A 01/13/2021   Procedure: C3-5 ANTERIOR CERVICAL DECOMPRESSION/DISCECTOMY FUSION;  Surgeon: Jodeen Munch, MD;  Location: [facility];  Service: Neurosurgery;  Laterality: N/A;   CERVICAL SPINE SURGERY     4   CLAVICLE SURGERY     COLONOSCOPY WITH PROPOFOL  N/A 06/29/2022   Procedure: COLONOSCOPY WITH PROPOFOL ;  Surgeon: Luke Salaam, MD;  Location: [facility];  Service: Gastroenterology;  Laterality: N/A;  USES MED TRANS; WILL NEED TIME ON FRIDAY, 06/24/2022   HARDWARE REMOVAL Left 04/20/2015   Procedure: HARDWARE REMOVAL left leg;  Surgeon: Marlynn Singer, MD;  Location: [facility];  Service: Orthopedics;  Laterality: Left;   KNEE SURGERY Left    NECK SURGERY     x6   SHOULDER SURGERY Left    TOTAL KNEE ARTHROPLASTY Left 04/12/2021   Procedure: TOTAL KNEE ARTHROPLASTY;  Surgeon:  Jerlyn Moons, MD;  Location: [facility];  Service: Orthopedics;  Laterality: Left;   ULNAR NERVE TRANSPOSITION Right 11/17/2021   Procedure: SUBCUTANEOUS TRANSPOSITION OF ULNAR NERVE, RIGHT ELBOW;  Surgeon: Elner Hahn, MD;  Location: [facility];  Service: Orthopedics;  Laterality: Right;   ULNAR NERVE TRANSPOSITION Right 10/04/2022   Procedure: ULNAR NERVE TRANSPOSITION;  Surgeon: Carroll Clamp, MD;  Location: [facility];  Service: Neurosurgery;  Laterality: Right;  NO BLOCK   Patient Active Problem List   Diagnosis Date Noted   Ulnar neuropathy of right upper extremity 10/05/2022   Hand weakness 10/05/2022   Nerve pain 10/05/2022   Adenomatous polyp of colon 06/29/2022   S/P TKR (total knee replacement) using cement, left 04/12/2021   Cervical myelopathy (HCC) 01/13/2021   Seizure (HCC) 11/26/2020   Abnormal thyroid blood test  04/06/2016   Encounter for screening colonoscopy 01/05/2016   Abnormal weight gain 01/05/2016   Chronic prescription benzodiazepine use 10/19/2015   Elevated liver enzymes 08/20/2015   Medication monitoring encounter 08/20/2015   Hypokalemia 08/20/2015   Erectile dysfunction 04/22/2015   Status post hardware removal 04/20/2015   Bipolar 2 disorder (HCC) 07/29/2014   Marijuana abuse 07/29/2014   Chronic knee pain 07/29/2014   Bipolar 1 disorder, mixed (HCC)    Drug abuse, opioid type (HCC) 06/28/2013   GERD without esophagitis 10/03/2012   Hypertension goal BP (blood pressure) < 140/90 10/03/2012   Chronic pain associated with significant psychosocial dysfunction 07/17/2012   Lumbar canal stenosis 04/09/2012   Hypercholesterolemia 01/06/2012   Constipation 11/11/2011   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/2010   Decreased motor strength 09/30/2010   Current tobacco use 09/30/2010   H/O transient cerebral ischemia 09/09/2010   Low back pain 08/30/2010   Cervical spinal cord compression (HCC) 07/22/2010   Cervical post-laminectomy syndrome 07/22/2010   Chronic obstructive pulmonary disease (HCC) 02/23/2010   ONSET DATE: 10/04/22 (most recent sx)  REFERRING DIAG: G56.21 (ICD-10-CM) - Ulnar neuropathy of right upper extremity   THERAPY DIAG:  Muscle weakness (generalized)  Other lack of coordination  Stiffness of right hand, not elsewhere classified  Stiffness of right wrist, not elsewhere classified  Ulnar neuropathy at elbow of right upper extremity  Rationale for Evaluation and Treatment: Rehabilitation  SUBJECTIVE:  SUBJECTIVE STATEMENT: Pt reports his MD did call in an Rx for a muscle relaxer, but pt has not yet been able to pick it up. Pt accompanied by: self  PERTINENT HISTORY:  Per note from Dr. Felipe Horton on 11/16/22: [name] is 2 weeks status post Ulnar nerve decompression, AIN to ulnar nerve transfer, and ulnar nerve peripheral nerve stimulator placement. Given  oxycodone  on discharge from the hospital.   Per medical record, pt underwent first ulnar nerve transposition on 11/17/21 and participated in OT post surgery.  2nd ulnar nerve transposition done on 10/04/22.  Pt had nerve stimulator placed but pt reports he does not like how it feels so he does not turn it on.  Pt also with hx of 6 cervical spinal surgeries; see above for additional hx.    PRECAUTIONS: None  RED FLAGS: None   WEIGHT BEARING RESTRICTIONS: No  PAIN: 06/21/23: 5/10 distal/ulnar forearm, hand, and neck; brief and sudden increases to severe pain with intermittent cramping relieved by passive stretching, rest, and moist heat Are you having pain? Yes: NPRS scale: 2/10 at rest, with activity 6-7/10 Pain location: ulnar side of R arm extending from elbow to hand Pain description: pins and needles, occasional  spasms, numbness Aggravating factors: "my arm doesn't like the cold, increased activity  Relieving factors: heat, rest, Gabapentin   LIVING ENVIRONMENT: Lives with: alone with 1 dog  PLOF: Independent, works as a Scientist, water quality  PATIENT GOALS: build up the strength in the R arm and hand  NEXT MD VISIT:  OBJECTIVE:  Note: Objective measures were completed at Evaluation unless otherwise noted.  HAND DOMINANCE: Right  ADLs: Overall ADLs: Pt reports he always tries to use the R arm as able, but in a limited capacity, and is unable to use the R hand with good accuracy or efficiency. Transfers/ambulation related to ADLs: indep Eating: gross grasp in R hand to hold utensils, difficulty cutting food  Grooming: uses L non-dominant hand to shave  UB Dressing: difficulty with clothing fasteners LB Dressing: difficulty with clothing fasteners, extra time to tie shoe laces  Toileting: uses L non-dominant hand for toilet hygiene Bathing: Pt states that he makes himself use the R hand when bathing but control is limited  FUNCTIONAL OUTCOME MEASURES: FOTO: 50; predicted with  56 12/26/22: FOTO: 49 02/02/23: FOTO: 50 03/01/23: FOTO: 59  04/26/23: NT; d/c FOTO  UPPER EXTREMITY ROM:     Active ROM Left eval Right eval Right 12/26/22 Right 02/02/23 Right 03/01/23 Right 04/26/23 Right 05/31/23  Shoulder flexion         Shoulder abduction         Shoulder adduction         Shoulder extension         Shoulder internal rotation         Shoulder external rotation         Elbow flexion         Elbow extension         Wrist flexion         Wrist extension 65 58 60 60 60 (Mps flexed) 60 (Mps flexed) 60 (Mps flexed)  Wrist ulnar deviation WNL -30 -30 -30 ( able to achieve neutral wrist with gravity assisted) -25 -30 -30 (P 12)  Wrist radial deviation WNL WNL WNL WNL     Wrist pronation 90 90 90 90 90 90 90  Wrist supination 75 70 75 80 80 80 82  (Blank rows = not tested)  Active ROM Right eval Left eval  Thumb MCP (0-60)    Thumb IP (0-80)    Thumb Radial abd/add (0-55)     Thumb Palmar abd/add (0-45)     Thumb Opposition to Small Finger     Index MCP (0-90)     Index PIP (0-100)     Index DIP (0-70)      Long MCP (0-90)      Long PIP (0-100)      Long DIP (0-70)      Ring MCP (0-90)      Ring PIP (0-100)      Ring DIP (0-70)      Little MCP (0-90)      Little PIP (0-100)      Little DIP (0-70)      (Blank rows = not tested)  Eval: R hand digits limited in PIP extension in 4th and 5th digits with neutral wrist (lacking ~25% ext in 4th digit PIP and 50% ext in 5th digit PIP) 12/26/22: R hand lacking ~10% ext in 4th digit PIP and lacking ~30% ext in 5th digit PIP 02/02/23: R hand lacking ~10% ext in 4th digit PIP and lacking ~30% ext in 5th digit PIP 03/01/23:  R hand lacking ~ R finger lacking about 25% ext, 5th digit lacking ~50% ext (feels bad today)  Eval: -limited active digit abd in 4th and 5th digits 12/26/22: able to achieve slight digit abduction in 4th and 5th digits when compensating with wrist and MP flexion  02/02/23: Same as 12/26/22;  fingers can abd on table top but by radially deviating  03/01/23: Pt can achieve minimal abduction of 4th and 5th digits if wrist is slightly flexed    Eval: -Unable to oppose R 4th and 5th digits to thumb  12/26/22: unable to oppose R 4th and 5th digits to thumb 02/02/23: able to oppose R 4th and 5th digit to thumb, but with increased effort 03/01/23: able to oppose R 4th digit to thumb, able to manage 5th digit if stabilizing digits 2-4 in extension  05/31/23: Able to oppose R 4th digit to thumb, able to manage 5th digit if stabilizing digits 2-4 in extension   UPPER EXTREMITY MMT:     MMT Left eval Right eval Right 12/26/22 Right 03/01/23 Right 04/26/23 Right 05/31/23  Shoulder flexion        Shoulder abduction        Shoulder adduction        Shoulder extension        Shoulder internal rotation        Shoulder external rotation        Middle trapezius        Lower trapezius        Elbow flexion 5 5      Elbow extension 5 5      Wrist flexion 5 4 4+ 4+ 5 5  Wrist extension 5 4+ (simultaneous radial dev) 4+ (slight radial dev) 4+ (slight radial dev) 4+ (slight radial dev) 4+ (slight radial dev)  Wrist ulnar deviation 5 1  2 2 2   Wrist radial deviation 5 5  5 5 5   Wrist pronation 5 5  5 5 5   Wrist supination 5 5  5 5 5   (Blank rows = not tested)  HAND FUNCTION: Grip strength: Right: 26 lbs; Left: 95 lbs, Lateral pinch: Right: 7 lbs, Left: 32 lbs, and 3 point pinch: Right: 4 lbs, Left: 27 lbs 12/12/22: R grip 35 lbs; R lateral pinch: 8 lbs: 3 point pinch: 4 lbs  12/26/22: R grip 33 lbs; R lateral pinch: 8 lbs; 3 point pinch: 4 lbs (Pt reports increased fatigue in R hand today following extensive use of the hand over the weekend when using a tool) 02/02/23: R grip 32 lbs; R lateral pinch: 4 lbs, 3 point pinch: 4 lbs (Pt reports using tools and shovels extensively over the last 2 days, possibly limited by muscle fatigue from overuse) 03/01/23: R grip 30 lbs (more painful today); L  grip 96 lbs; R lateral pinch: R 8 lbs, L 27 lbs; 3 point pinch: R 4 lbs,  L 19 lbs 04/12/23: R grip 38 lbs 04/25/23: R grip 39 lbs, R lateral pinch: R: 8 lbs, R 3 point pinch: 3 lbs 05/31/23: R grip 35 lbs, R lateral pinch: R: 9 lbs, R 3 point pinch: 1 lb  COORDINATION: 9 Hole Peg test: Right: 2 min 39 sec; Left: 26 sec 12/26/22: R 48 secs 02/02/23: R 51 sec  03/01/23: R 46 sec  04/26/23: R 42 sec 05/31/23: R 48 sec  SENSATION: Light touch: Impaired ; ulnar nerve distribution  EDEMA: very mild edema surrounding incision sites on R forearm and elbow  COGNITION: Overall cognitive status: Within functional limits for tasks assessed Areas of impairment:  hx of bipolar 1, schizophrenia, and drug and alcohol abuse  OBSERVATIONS:  Pt pleasant, cooperative, and eager to regain strength in his R dominant arm.  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   Note for education/handouts: Pt reports he is limited with his reading and writing ability, but states he will always ask if he doesn't understand something.  TODAY'S TREATMENT:                                                                                                                              DATE: 06/21/23 Therapeutic Exercise: Facilitated RUE flexibility exercises: -Completed passive stretching for R wrist and digit ext, ulnar deviation -Instructed in self passive wall stretch to promote R shoulder abd, elbow ext, forearm pron, and wrist and digit ext -Facilitated R hand pinch strengthening using therapy resistant clothespins (all colors) for 2 trials each pinch type and color: lateral and 3 point pinch, min vc for pinch patterns and minimizing substitution patterns during 3 point pinching.  -R grip strengthening with hand gripper set at 17.9 lbs for 1 trial, progressing to 23.4# for a 2nd trial, and 28.9# for 3rd trial to remove jumbo pegs from pegboard.  Rest and passive wrist and digit  extension stretching between trials  Therapeutic Activity: -Facilitated active R forearm pron/sup and digit flex/ext working to store/release multiple small pegs from hand -Facilitated 2 point and 3 point pinch prehension working to remove small pegs from pegboard -Facilitated digit opposition to digits 3-5 working to maintain grasp of 1" cubed blocks, placing and removing blocks from velcro board   PATIENT EDUCATION: Education details: HEP review Person educated: Patient Education method: Explanation Education comprehension: verbalized understanding  HOME EXERCISE PROGRAM: Yellow theraputty, wrist/forearm strengthening with 2# dumbbell, ulnar nerve glides  GOALS: Goals reviewed with patient? Yes  SHORT TERM GOALS: Target date: 01/02/23  Pt will be indep to perform HEP for improving distal RUE flexibility, strength, and coordination. Baseline: Eval: Not yet initiated; 12/26/22: pt routinely works on resistive hand strengthening exercises with tools and putty at home, as well as stretching wrist and digits, and FMC activities.  Goal status: achieved  LONG TERM GOALS: Target date: 08/23/23  Pt will increase FOTO score to 64 or better to indicate improvement in self perceived functional use of the R arm with daily tasks (revised on 03/01/23 from 56). Baseline: Eval: 50; 12/26/22: 49; 02/02/23: 50; 03/01/23: 59; 04/26/23: d/c FOTO Goal status: d/c  2.  Pt will increase R grip strength by 15 (revised from 10 lbs on 04/26/23) or more lbs to ease ability carry and transport heavy items securely in R dominant hand. Baseline: Eval: R grip 26 lbs (non-dominant L 95 lbs); 12/26/22: R grip 33 lbs; 02/02/23: R grip 32 lbs; 03/01/23: R grip 30 lbs;  04/26/23: R grip 39 lbs; 05/31/23: R  grip 35 lbs Goal status: ongoing  3.  Pt will increase R lateral pinch strength by 5 or more lbs to ease ability to open drink bottles. Baseline: Eval: R 7 lbs (non-dominant L 32 lbs); 12/26/22: R 8 lbs; 02/02/23: 4 lbs;  03/01/23: R 8 lbs; 04/26/23: R 8 lbs; L hand usually does the twisting to open a juice bottle; 05/31/23: R 9 lbs Goal status: ongoing  4.  Pt will tolerate manual therapy, therapeutic modalities, and exercises to decrease pain in RUE to a reported 2-3/10 pain or less with activity.   Baseline: Eval: R forearm and hand 6-7/10 pain with activity; 12/26/22: Pt reports increased pain today after increased use of R hand over the weekend; still at 6-7/10 pain with activity; 02/02/23: 1/10 pain at rest, 3.5/10 pain with activity; 03/01/23:  2-3/10 pain in the RUE prior to seizure, today reporting 7/10 pain wide spread throughout the body and down RUE, with pt reporting that the last major seizure caused him to hurt for 3 weeks; 04/26/23: 6/10 pain in RUE after feeling cold in therapy clinic; pt reports 2-4/10 pain RUE in general with activity; 05/31/23: 5/10 pain in RUE, ulnar side of distal forearm and hand Goal status: ongoing  5.  Pt will increase R hand FMC/dexterity skills to improve efficiency with clothing fasteners as noted by completion of 9 hole peg test in <40 sec (revised on 12/26/22 from <2 min).  Baseline: Eval: 2 min 39 sec; extra time clothing fasteners, specifically buttons and tying shoe laces; 12/26/22: R 48 sec; 02/02/23: 51 sec; 03/01/23: R 46 sec; 04/26/23: R 42 sec; pt reports improving efficiency with clothing fasteners, though pulling loops of shoe laces tends to still be a challenge; 05/31/23: R 48 sec Goal status: ongoing  ASSESSMENT:   CLINICAL IMPRESSION:   Pt received call during OT session that Dr. Chrystal Crape office would require pt to quit smoking before they can proceed with surgery.  Pt voiced frustration with this call and plans to follow up with their office re: his concerns.  Pt was pleased to demonstrate increased digit extension this date with a more neutral wrist.  Pt tolerated 3 sets total on hand gripper, progressing from 17.9#, to 23.4#, to 28.9# to remove jumbo pegs from  pegboard.  Pt struggled with digit opposition to 5th digit, but improving with thumb to 4th digit.  Pt will benefit from continued manual therapy techniques and therapeutic exercises to reduce this stiffness, improve pain, and increase strength and flexibility to maximize functional use of the RUE with remaining visits.   PERFORMANCE DEFICITS: in functional skills including ADLs, IADLs, coordination, dexterity, sensation, edema, ROM, strength, pain, fascial restrictions, muscle spasms, flexibility, Fine motor control, body mechanics, decreased knowledge of use of DME, skin integrity, and UE functional use, and psychosocial skills including coping strategies, environmental adaptation, habits, and routines and behaviors.   IMPAIRMENTS: are limiting patient from ADLs, IADLs, rest and sleep, work, and leisure.   COMORBIDITIES: has co-morbidities such as hx of 6 cervical spinal surgeries, bipolar 1, schizophrenia  that affects occupational performance. Patient will benefit from skilled OT to address above impairments and improve overall function.  MODIFICATION OR ASSISTANCE TO COMPLETE EVALUATION: No modification of tasks or assist necessary to complete an evaluation.  OT OCCUPATIONAL PROFILE AND HISTORY: Problem focused assessment: Including review of records relating to presenting problem.  CLINICAL DECISION MAKING: Moderate - several treatment options, min-mod task modification necessary  REHAB POTENTIAL: Good  EVALUATION COMPLEXITY: Moderate  PLAN:  OT FREQUENCY: 1x/week  OT DURATION: 12 weeks  PLANNED INTERVENTIONS: self care/ADL training, therapeutic exercise, therapeutic activity, neuromuscular re-education, manual therapy, scar mobilization, passive range of motion, splinting, electrical stimulation, paraffin, moist heat, cryotherapy, contrast bath, patient/family education, cognitive remediation/compensation, psychosocial skills training, coping strategies training, and DME and/or AE  instructions  RECOMMENDED OTHER SERVICES: None at this time  CONSULTED AND AGREED WITH PLAN OF CARE: Patient  PLAN FOR NEXT SESSION: see above  Marcus Sewer, MS, OTR/L  Casandra Claw, OT 06/21/2023, 1:38 PM   ..

## 2023-06-23 NOTE — Telephone Encounter (Signed)
 He confirmed appt for 07/02/2023

## 2023-06-26 ENCOUNTER — Ambulatory Visit: Payer: MEDICAID

## 2023-06-28 ENCOUNTER — Ambulatory Visit: Payer: MEDICAID

## 2023-06-29 ENCOUNTER — Encounter: Payer: Self-pay | Admitting: Neurosurgery

## 2023-06-29 ENCOUNTER — Ambulatory Visit (INDEPENDENT_AMBULATORY_CARE_PROVIDER_SITE_OTHER): Payer: MEDICAID | Admitting: Neurosurgery

## 2023-06-29 VITALS — BP 134/92 | Ht 73.0 in | Wt 240.0 lb

## 2023-06-29 DIAGNOSIS — M960 Pseudarthrosis after fusion or arthrodesis: Secondary | ICD-10-CM

## 2023-06-29 DIAGNOSIS — G959 Disease of spinal cord, unspecified: Secondary | ICD-10-CM

## 2023-06-29 DIAGNOSIS — M4802 Spinal stenosis, cervical region: Secondary | ICD-10-CM

## 2023-06-29 NOTE — Progress Notes (Signed)
 Referring Physician:  No referring provider defined for this encounter.  Primary Physician:  Rockney Cid, DO  History of Present Illness: 06/29/2023 Mr wragg returns to discuss his nicotine  use.   He is down to 7 cigarettes/day.  He does not think he can completely stop.  04/27/2023 Mr. Scharpf presents today with continued neck pain.  He has cut his tobacco use in half.  03/23/2023 Mr. Keitz continues to have neck pain.  He has been doing physical therapy for his hand.  He continues to have some issues with his balance.  02/28/2023 Mr. Burford Wheatley is here today with a chief complaint of neck pain.  He is having some problems with his balance.  He had a right ulnar nerve transposition in the summer and is recovering from that.  His hand function has improved.  The symptoms are causing a significant impact on the patient's life.   I have utilized the care everywhere function in epic to review the outside records available from external health systems.  Progress Note from Lucetta Russel, [place] on 12/26/22:   Mr. Jovannie Lemon has a history of seizures, HTN, hyperlipidemia, COPD, bipolar, FM, history of ETOH abuse, and GERD.    He is s/p ulnar nerve decompression, AIN to ulnar nerve transfer, and ulnar nerve peripheral nerve stimulator placement by Dr. Felipe Horton on 10/04/22.    His pain was improving at his last visit, but he did not like the feeling of the stimulator, so he stopped using it. Dr. Felipe Horton sent him to OT and he was to follow up prn.    History of ACDF C3-C5 for myelopathy on 01/13/21 with Dr. Mont Antis. History of dysfunction in right hand due to history of multiple fractures.    PMR referred him to [facility] for left C7-T1 IL ESI. This has not been done.    He feels like his right hand is improving- he is on OT for this and has appointment later today.    He is now having constant neck pain that radiates to both shoulder blades. No arm pain. Neck pain is sharp and stabbing.  Worse when he moves his head. Some relief with heat and resting. He has numbness, tingling, weakness in right ring and small finger. No numbness, tingling, or weakness in his left hand.     He is taking neurontin , mobic , and zanaflex. He is afraid of needles and doesn't think he will be able to have cervical injection.    Bowel/Bladder Dysfunction: none   He smokes 1 and 1/2 ppd x 30 years.    Conservative measures:  Physical therapy: no recent Multimodal medical therapy including regular antiinflammatories: celebrex, robaxin , oxycodone   Injections: No recent epidural steroid injections   Past Surgery:  History of ACDF C3-C5 for myelopathy on 01/13/21 with Dr. Mont Antis History of 5 previous cervical fusion surgeries  Review of Systems:  A 10 point review of systems is negative, except for the pertinent positives and negatives detailed in the HPI.  Past Medical History: Past Medical History:  Diagnosis Date   Adenomatous polyp of colon    Arthritis    Asthma    Benign fibroma of prostate    Bipolar 1 disorder (HCC)    Cervical myelopathy (HCC)    Cervical post-laminectomy syndrome    Cervical spinal cord compression (HCC)    Chronic knee pain    Chronic pain associated with significant psychosocial dysfunction    Chronic prescription benzodiazepine use    Constipation  COPD (chronic obstructive pulmonary disease) (HCC)    Decreased motor strength    Drug abuse, opioid type (HCC)    Dyspnea    Elevated liver enzymes    Erectile dysfunction    Fibromyalgia    GERD (gastroesophageal reflux disease)    H/O ETOH abuse    Headache    High cholesterol    History of 2019 novel coronavirus disease (COVID-19) 11/01/2021   History of transient cerebral ischemia    Homicidal ideation    Hypertension    Hypokalemia    Living accommodation issues    a.) as of 03/29/2021 --> living in camper   Lower back pain    Lumbar canal stenosis    Marijuana use    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   Pneumonia    Presence of permanent cardiac pacemaker    S/P TKR (total knee replacement)    Schizophrenia (HCC)    Seizures (HCC) 11/2020   Status post hardware removal    Stroke (HCC)    no deficits   Tobacco use     Past Surgical History: Past Surgical History:  Procedure Laterality Date   ANTERIOR CERVICAL DECOMP/DISCECTOMY FUSION N/A 01/13/2021   Procedure: C3-5 ANTERIOR CERVICAL DECOMPRESSION/DISCECTOMY FUSION;  Surgeon: Jodeen Munch, MD;  Location: [facility] ORS;  Service: Neurosurgery;  Laterality: N/A;   CERVICAL SPINE SURGERY     4   CLAVICLE SURGERY     COLONOSCOPY WITH PROPOFOL  N/A 06/29/2022   Procedure: COLONOSCOPY WITH PROPOFOL ;  Surgeon: Luke Salaam, MD;  Location: [facility] END[facility]COPY;  Service: Gastroenterology;  Laterality: N/A;  USES MED TRANS; WILL NEED TIME ON FRIDAY, 06/24/2022   HARDWARE REMOVAL Left 04/20/2015   Procedure: HARDWARE REMOVAL left leg;  Surgeon: Marlynn Singer, MD;  Location: [facility] ORS;  Service: Orthopedics;  Laterality: Left;   KNEE SURGERY Left    NECK SURGERY     x6   SHOULDER SURGERY Left    TOTAL KNEE ARTHROPLASTY Left 04/12/2021   Procedure: TOTAL KNEE ARTHROPLASTY;  Surgeon: Jerlyn Moons, MD;  Location: [facility] ORS;  Service: Orthopedics;  Laterality: Left;   ULNAR NERVE TRANSPOSITION Right 11/17/2021   Procedure: SUBCUTANEOUS TRANSPOSITION OF ULNAR NERVE, RIGHT ELBOW;  Surgeon: Elner Hahn, MD;  Location: [facility] ORS;  Service: Orthopedics;  Laterality: Right;   ULNAR NERVE TRANSPOSITION Right 10/04/2022   Procedure: ULNAR NERVE TRANSPOSITION;  Surgeon: Carroll Clamp, MD;  Location: [facility] ORS;  Service: Neurosurgery;  Laterality: Right;  NO BLOCK    Allergies: Allergies as of 06/29/2023 - Review Complete 06/29/2023  Allergen Reaction Noted   Acetaminophen  Other (See Comments) 07/06/2014    Medications:  Current Outpatient Medications:    albuterol  (VENTOLIN  HFA) 108 (90 Base) MCG/ACT inhaler, INHALE 2 PUFFS  INTO LUNGS EVERY 6 HOURS AS NEEDED FOR WHEEZING, Disp: 18 each, Rfl: 2   atorvastatin  (LIPITOR) 40 MG tablet, Take 1 tablet (40 mg total) by mouth daily., Disp: 90 tablet, Rfl: 1   divalproex  (DEPAKOTE ) 250 MG DR tablet, Take 1 tablet (250 mg total) by mouth 3 (three) times daily., Disp: 90 tablet, Rfl: 1   famotidine  (PEPCID ) 40 MG tablet, Take 1 tablet (40 mg total) by mouth daily., Disp: 90 tablet, Rfl: 1   gabapentin  (NEURONTIN ) 300 MG capsule, Take 1 capsule (300 mg total) by mouth 3 (three) times daily., Disp: 270 capsule, Rfl: 3   ipratropium-albuterol  (DUONEB) 0.5-2.5 (3) MG/3ML SOLN, Take 3 mLs by nebulization 2 (two) times daily., Disp:  360 mL, Rfl: 1   lisinopril  (ZESTRIL ) 10 MG tablet, Take 1 tablet (10 mg total) by mouth daily., Disp: 90 tablet, Rfl: 1   methocarbamol  (ROBAXIN ) 500 MG tablet, Take 1 tablet (500 mg total) by mouth every 6 (six) hours as needed for muscle spasms., Disp: 120 tablet, Rfl: 0   SYMBICORT  160-4.5 MCG/ACT inhaler, INHALE 2 PUFFS INTO THE LUNGS TWICE A DAY, Disp: 10.2 each, Rfl: 3   QUEtiapine  (SEROQUEL ) 200 MG tablet, Take 500 mg by mouth at bedtime., Disp: , Rfl:   Social History: Social History   Tobacco Use   Smoking status: Every Day    Current packs/day: 1.50    Average packs/day: 1.5 packs/day for 30.0 years (45.0 ttl pk-yrs)    Types: Cigarettes   Smokeless tobacco: Never  Vaping Use   Vaping status: Never Used  Substance Use Topics   Alcohol use: No    Comment: Sober for 10 yrs.   Drug use: Yes    Frequency: 7.0 times per week    Types: Marijuana    Family Medical History: Family History  Problem Relation Age of Onset   Diabetes Mother    Hyperlipidemia Mother    Hypertension Mother    Diabetes Maternal Aunt    Cancer Maternal Aunt    Hyperlipidemia Maternal Aunt    Hypertension Maternal Aunt    Diabetes Maternal Uncle    Cancer Maternal Uncle    Hyperlipidemia Maternal Uncle    Hypertension Maternal Uncle     Physical  Examination: Vitals:   06/29/23 1513  BP: (!) 134/92     General: Patient is in no apparent distress. Attention to examination is appropriate.  Neck:   Supple.  Full range of motion.  Respiratory: Patient is breathing without any difficulty.   NEUROLOGICAL:     Awake, alert, oriented to person, place, and time.  Speech is clear and fluent.   Cranial Nerves: Pupils equal round and reactive to light.  Facial tone is symmetric.  Facial sensation is symmetric. Shoulder shrug is symmetric. Tongue protrusion is midline.  There is no pronator drift.  Strength: Side Biceps Triceps Deltoid Interossei Grip Wrist Ext. Wrist Flex.  R 5 5 5 3  4- 4 4  L 5 5 5 5 5 5 5    Side Iliopsoas Quads Hamstring PF DF EHL  R 5 5 5 5 5 5   L 5 5 5 5 5 5    Reflexes are 2+ and symmetric at the biceps, triceps, brachioradialis, patella and achilles.   Hoffman's is absent.   Bilateral upper and lower extremity sensation is intact to light touch.    No evidence of dysmetria noted.  Gait is slowed.     Medical Decision Making  Imaging: MRI C spine 08/08/2022 IMPRESSION: 1. Status post interval extension of fusion to C3-C5, with persistent moderate to severe spinal canal stenosis and severe bilateral neural foraminal narrowing at C4-C5. 2. C3-C4 mild-to-moderate left and mild right neural foraminal narrowing, without residual spinal canal stenosis. 3. C5-C6 mild left neural foraminal narrowing, unchanged. 4. C6-C7 moderate left neural foraminal narrowing, unchanged. 5. C7-T1 mild spinal canal stenosis with severe right and moderate to severe left neural foraminal narrowing, unchanged. 6. T2 hyperintense foci in the bilateral anterior horn cells at the level of C6-C7 appear more prominent than on the prior exam but were likely present. This likely represents myelomalacia.     Electronically Signed   By: Zoila Hines M.D.   On: 08/14/2022 21:29  CT C spine 03/08/2023 FINDINGS: Alignment:  Normal.   Skull base and vertebrae: ACDF from C3-C7. There are 2 plates spanning C3-C5 and C5-C7. Solid arthrodesis is seen except at the level of C4-5 where there is scalloping around the lower cage at the C5 superior endplate and C5 screw loosening associated with the upper plate. Posterior instrumentation spanning C5-C7 with solid posterior-lateral arthrodesis. No fracture or aggressive bone lesion.   Soft tissues and spinal canal: No evidence of inflammation or mass.   Disc levels: Underestimated compared to prior MRI there is biforaminal impingement at C4-5 from disc height loss and ridging. The other postoperative levels foramina show better patency. Spinal canal better assessed on prior MRI.   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     Electronically Signed   By: Ronnette Coke M.D.   On: 03/18/2023 07:42  I have personally reviewed the images and agree with the above interpretation.  EMG 07/18/2022 Impression: Abnormal study. There is electrodiagnostic evidence of a chronic, mild right ulnar mononeuropathy.  Thank you for the referral of this patient. It was our privilege to participate in care of your patient. Feel free to contact us  with any further questions.  _____________________________ Cullen Dose, MD   Assessment and Plan: Mr. Elpers is a pleasant 53 y.o. male with history of cervical myelopathy with cervical stenosis at C4-5.  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  This is the level where he has compression.    I think he is a candidate for intervention to try to establish fusion at C4-5.  I have recommended C4-5 posterior decompression and fusion.  I would ideally like him to be abstinent from nicotine .  We have made a compromise.  He will diminish his nicotine  use to 2 cigarettes/day for 30 days.  We will test his nicotine   level.  If he is appropriately low, we will consider moving forward with the surgical intervention.  I would utilize bone morphogenetic protein to address his pseudoarthrosis.    I spent a total of 10 minutes in this patient's care today. This time was spent reviewing pertinent records including imaging studies, obtaining and confirming history, performing a directed evaluation, formulating and discussing my recommendations, and documenting the visit within the medical record.      Thank you for involving me in the care of this patient.      Laron Angelini K. Mont Antis MD, [facility]

## 2023-07-03 ENCOUNTER — Other Ambulatory Visit: Payer: Self-pay | Admitting: Neurosurgery

## 2023-07-03 ENCOUNTER — Ambulatory Visit: Payer: MEDICAID

## 2023-07-03 DIAGNOSIS — M4802 Spinal stenosis, cervical region: Secondary | ICD-10-CM

## 2023-07-03 DIAGNOSIS — R29898 Other symptoms and signs involving the musculoskeletal system: Secondary | ICD-10-CM

## 2023-07-03 DIAGNOSIS — M792 Neuralgia and neuritis, unspecified: Secondary | ICD-10-CM

## 2023-07-03 DIAGNOSIS — G959 Disease of spinal cord, unspecified: Secondary | ICD-10-CM

## 2023-07-03 DIAGNOSIS — G5621 Lesion of ulnar nerve, right upper limb: Secondary | ICD-10-CM

## 2023-07-04 NOTE — Telephone Encounter (Signed)
 Please call him to find out if he needs gabapentin . Looks like he was given a 3 month supply with 3 refills (year supply) on 09/07/22.   Also please ask if he needs refill of robaxin . He was given 1 month supply on 06/20/23 and should not be out yet.   I will deny both robaxin  and neurontin  for now.

## 2023-07-04 NOTE — Telephone Encounter (Signed)
Patient does not need refills at this time

## 2023-07-04 NOTE — Telephone Encounter (Signed)
 See other message from today.   Calling to see if he needs robaxin . Given 1 month supply on 06/20/23. Will deny it for now.

## 2023-07-05 ENCOUNTER — Ambulatory Visit: Payer: MEDICAID

## 2023-07-05 DIAGNOSIS — M25631 Stiffness of right wrist, not elsewhere classified: Secondary | ICD-10-CM

## 2023-07-05 DIAGNOSIS — R278 Other lack of coordination: Secondary | ICD-10-CM

## 2023-07-05 DIAGNOSIS — M25641 Stiffness of right hand, not elsewhere classified: Secondary | ICD-10-CM

## 2023-07-05 DIAGNOSIS — G5621 Lesion of ulnar nerve, right upper limb: Secondary | ICD-10-CM

## 2023-07-05 DIAGNOSIS — M6281 Muscle weakness (generalized): Secondary | ICD-10-CM | POA: Diagnosis not present

## 2023-07-06 NOTE — Therapy (Signed)
 OUTPATIENT OCCUPATIONAL THERAPY ORTHO TREATMENT NOTE  Patient Name: Scott Howell MRN: [id number] DOB:03/29/1970, 53 y.o., male Today's Date: 07/06/2023  PCP: Dr. Rockney Cid REFERRING PROVIDER: Dr. Henderson Lock   END OF SESSION:  OT End of Session - 07/06/23 1612     Visit Number 36    Number of Visits 37    Date for OT Re-Evaluation 08/23/23    Authorization Type approved for 1/22-7/21 for 12 OT visits    Authorization Time Period Progress reporting period beginning 04/26/23    Authorization - Visit Number 11    Authorization - Number of Visits 12    Progress Note Due on Visit 40    OT Start Time 0930    OT Stop Time 1015    OT Time Calculation (min) 45 min    Activity Tolerance Patient tolerated treatment well    Behavior During Therapy WFL for tasks assessed/performed            Past Medical History:  Diagnosis Date   Adenomatous polyp of colon    Arthritis    Asthma    Benign fibroma of prostate    Bipolar 1 disorder (HCC)    Cervical myelopathy (HCC)    Cervical post-laminectomy syndrome    Cervical spinal cord compression (HCC)    Chronic knee pain    Chronic pain associated with significant psychosocial dysfunction    Chronic prescription benzodiazepine use    Constipation    COPD (chronic obstructive pulmonary disease) (HCC)    Decreased motor strength    Drug abuse, opioid type (HCC)    Dyspnea    Elevated liver enzymes    Erectile dysfunction    Fibromyalgia    GERD (gastroesophageal reflux disease)    H/O ETOH abuse    Headache    High cholesterol    History of 2019 novel coronavirus disease (COVID-19) 11/01/2021   History of transient cerebral ischemia    Homicidal ideation    Hypertension    Hypokalemia    Living accommodation issues    a.) as of 03/29/2021 --> living in camper   Lower back pain    Lumbar canal stenosis    Marijuana use    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   Pneumonia    Presence of permanent  cardiac pacemaker    S/P TKR (total knee replacement)    Schizophrenia (HCC)    Seizures (HCC) 11/2020   Status post hardware removal    Stroke (HCC)    no deficits   Tobacco use    Past Surgical History:  Procedure Laterality Date   ANTERIOR CERVICAL DECOMP/DISCECTOMY FUSION N/A 01/13/2021   Procedure: C3-5 ANTERIOR CERVICAL DECOMPRESSION/DISCECTOMY FUSION;  Surgeon: Jodeen Munch, MD;  Location: [facility];  Service: Neurosurgery;  Laterality: N/A;   CERVICAL SPINE SURGERY     4   CLAVICLE SURGERY     COLONOSCOPY WITH PROPOFOL  N/A 06/29/2022   Procedure: COLONOSCOPY WITH PROPOFOL ;  Surgeon: Luke Salaam, MD;  Location: [facility];  Service: Gastroenterology;  Laterality: N/A;  USES MED TRANS; WILL NEED TIME ON FRIDAY, 06/24/2022   HARDWARE REMOVAL Left 04/20/2015   Procedure: HARDWARE REMOVAL left leg;  Surgeon: Marlynn Singer, MD;  Location: [facility];  Service: Orthopedics;  Laterality: Left;   KNEE SURGERY Left    NECK SURGERY     x6   SHOULDER SURGERY Left    TOTAL KNEE ARTHROPLASTY Left 04/12/2021   Procedure: TOTAL KNEE ARTHROPLASTY;  Surgeon:  Jerlyn Moons, MD;  Location: [facility];  Service: Orthopedics;  Laterality: Left;   ULNAR NERVE TRANSPOSITION Right 11/17/2021   Procedure: SUBCUTANEOUS TRANSPOSITION OF ULNAR NERVE, RIGHT ELBOW;  Surgeon: Elner Hahn, MD;  Location: [facility];  Service: Orthopedics;  Laterality: Right;   ULNAR NERVE TRANSPOSITION Right 10/04/2022   Procedure: ULNAR NERVE TRANSPOSITION;  Surgeon: Carroll Clamp, MD;  Location: [facility];  Service: Neurosurgery;  Laterality: Right;  NO BLOCK   Patient Active Problem List   Diagnosis Date Noted   Ulnar neuropathy of right upper extremity 10/05/2022   Hand weakness 10/05/2022   Nerve pain 10/05/2022   Adenomatous polyp of colon 06/29/2022   S/P TKR (total knee replacement) using cement, left 04/12/2021   Cervical myelopathy (HCC) 01/13/2021   Seizure (HCC) 11/26/2020   Abnormal thyroid blood  test 04/06/2016   Encounter for screening colonoscopy 01/05/2016   Abnormal weight gain 01/05/2016   Chronic prescription benzodiazepine use 10/19/2015   Elevated liver enzymes 08/20/2015   Medication monitoring encounter 08/20/2015   Hypokalemia 08/20/2015   Erectile dysfunction 04/22/2015   Status post hardware removal 04/20/2015   Bipolar 2 disorder (HCC) 07/29/2014   Marijuana abuse 07/29/2014   Chronic knee pain 07/29/2014   Bipolar 1 disorder, mixed (HCC)    Drug abuse, opioid type (HCC) 06/28/2013   GERD without esophagitis 10/03/2012   Hypertension goal BP (blood pressure) < 140/90 10/03/2012   Chronic pain associated with significant psychosocial dysfunction 07/17/2012   Lumbar canal stenosis 04/09/2012   Hypercholesterolemia 01/06/2012   Constipation 11/11/2011   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/2010   Decreased motor strength 09/30/2010   Current tobacco use 09/30/2010   H/O transient cerebral ischemia 09/09/2010   Low back pain 08/30/2010   Cervical spinal cord compression (HCC) 07/22/2010   Cervical post-laminectomy syndrome 07/22/2010   Chronic obstructive pulmonary disease (HCC) 02/23/2010   ONSET DATE: 10/04/22 (most recent sx)  REFERRING DIAG: G56.21 (ICD-10-CM) - Ulnar neuropathy of right upper extremity   THERAPY DIAG:  Muscle weakness (generalized)  Other lack of coordination  Stiffness of right hand, not elsewhere classified  Stiffness of right wrist, not elsewhere classified  Ulnar neuropathy at elbow of right upper extremity  Rationale for Evaluation and Treatment: Rehabilitation  SUBJECTIVE:  SUBJECTIVE STATEMENT: Pt reports frustration that it has been recommended that pt decrease his smoking down to 2 cigarettes a day before moving forward with a cervical spinal surgery, as he is currently at 7 per day and doesn't think he can do much better than that. Pt accompanied by: self  PERTINENT HISTORY:  Per note from Dr. Felipe Horton on 11/16/22: Scott Howell is 2 weeks status post Ulnar nerve decompression, AIN to ulnar nerve transfer, and ulnar nerve peripheral nerve stimulator placement. Given oxycodone  on discharge from the hospital.   Per medical record, pt underwent first ulnar nerve transposition on 11/17/21 and participated in OT post surgery.  2nd ulnar nerve transposition done on 10/04/22.  Pt had nerve stimulator placed but pt reports he does not like how it feels so he does not turn it on.  Pt also with hx of 6 cervical spinal surgeries; see above for additional hx.    PRECAUTIONS: None  RED FLAGS: None   WEIGHT BEARING RESTRICTIONS: No  PAIN: 07/05/23: 6/10 distal/ulnar forearm, hand, and neck; brief and sudden increases to severe pain with intermittent cramping relieved by passive stretching, rest, and moist heat Are you having pain? Yes: NPRS scale: 2/10 at rest,  with activity 6-7/10 Pain location: ulnar side of R arm extending from elbow to hand Pain description: pins and needles, occasional spasms, numbness Aggravating factors: "my arm doesn't like the cold, increased activity  Relieving factors: heat, rest, Gabapentin   LIVING ENVIRONMENT: Lives with: alone with 1 dog  PLOF: Independent, works as a Scientist, water quality  PATIENT GOALS: build up the strength in the R arm and hand  NEXT MD VISIT:  OBJECTIVE:  Note: Objective measures were completed at Evaluation unless otherwise noted.  HAND DOMINANCE: Right  ADLs: Overall ADLs: Pt reports he always tries to use the R arm as able, but in a limited capacity, and is unable to use the R hand with good accuracy or efficiency. Transfers/ambulation related to ADLs: indep Eating: gross grasp in R hand to hold utensils, difficulty cutting food  Grooming: uses L non-dominant hand to shave  UB Dressing: difficulty with clothing fasteners LB Dressing: difficulty with clothing fasteners, extra time to tie shoe laces  Toileting: uses L non-dominant hand for toilet hygiene Bathing: Pt  states that he makes himself use the R hand when bathing but control is limited  FUNCTIONAL OUTCOME MEASURES: FOTO: 50; predicted with 56 12/26/22: FOTO: 49 02/02/23: FOTO: 50 03/01/23: FOTO: 59  04/26/23: NT; d/c FOTO  UPPER EXTREMITY ROM:     Active ROM Left eval Right eval Right 12/26/22 Right 02/02/23 Right 03/01/23 Right 04/26/23 Right 05/31/23  Shoulder flexion         Shoulder abduction         Shoulder adduction         Shoulder extension         Shoulder internal rotation         Shoulder external rotation         Elbow flexion         Elbow extension         Wrist flexion         Wrist extension 65 58 60 60 60 (Mps flexed) 60 (Mps flexed) 60 (Mps flexed)  Wrist ulnar deviation WNL -30 -30 -30 ( able to achieve neutral wrist with gravity assisted) -25 -30 -30 (P 12)  Wrist radial deviation WNL WNL WNL WNL     Wrist pronation 90 90 90 90 90 90 90  Wrist supination 75 70 75 80 80 80 82  (Blank rows = not tested)  Active ROM Right eval Left eval  Thumb MCP (0-60)    Thumb IP (0-80)    Thumb Radial abd/add (0-55)     Thumb Palmar abd/add (0-45)     Thumb Opposition to Small Finger     Index MCP (0-90)     Index PIP (0-100)     Index DIP (0-70)      Long MCP (0-90)      Long PIP (0-100)      Long DIP (0-70)      Ring MCP (0-90)      Ring PIP (0-100)      Ring DIP (0-70)      Little MCP (0-90)      Little PIP (0-100)      Little DIP (0-70)      (Blank rows = not tested)  Eval: R hand digits limited in PIP extension in 4th and 5th digits with neutral wrist (lacking ~25% ext in 4th digit PIP and 50% ext in 5th digit PIP) 12/26/22: R hand lacking ~10% ext in 4th digit PIP and lacking ~30% ext in 5th  digit PIP 02/02/23: R hand lacking ~10% ext in 4th digit PIP and lacking ~30% ext in 5th digit PIP 03/01/23: R hand lacking ~ R finger lacking about 25% ext, 5th digit lacking ~50% ext (feels bad today)  Eval: -limited active digit abd in 4th and 5th  digits 12/26/22: able to achieve slight digit abduction in 4th and 5th digits when compensating with wrist and MP flexion  02/02/23: Same as 12/26/22; fingers can abd on table top but by radially deviating  03/01/23: Pt can achieve minimal abduction of 4th and 5th digits if wrist is slightly flexed    Eval: -Unable to oppose R 4th and 5th digits to thumb  12/26/22: unable to oppose R 4th and 5th digits to thumb 02/02/23: able to oppose R 4th and 5th digit to thumb, but with increased effort 03/01/23: able to oppose R 4th digit to thumb, able to manage 5th digit if stabilizing digits 2-4 in extension  05/31/23: Able to oppose R 4th digit to thumb, able to manage 5th digit if stabilizing digits 2-4 in extension   UPPER EXTREMITY MMT:     MMT Left eval Right eval Right 12/26/22 Right 03/01/23 Right 04/26/23 Right 05/31/23  Shoulder flexion        Shoulder abduction        Shoulder adduction        Shoulder extension        Shoulder internal rotation        Shoulder external rotation        Middle trapezius        Lower trapezius        Elbow flexion 5 5      Elbow extension 5 5      Wrist flexion 5 4 4+ 4+ 5 5  Wrist extension 5 4+ (simultaneous radial dev) 4+ (slight radial dev) 4+ (slight radial dev) 4+ (slight radial dev) 4+ (slight radial dev)  Wrist ulnar deviation 5 1  2 2 2   Wrist radial deviation 5 5  5 5 5   Wrist pronation 5 5  5 5 5   Wrist supination 5 5  5 5 5   (Blank rows = not tested)  HAND FUNCTION: Grip strength: Right: 26 lbs; Left: 95 lbs, Lateral pinch: Right: 7 lbs, Left: 32 lbs, and 3 point pinch: Right: 4 lbs, Left: 27 lbs 12/12/22: R grip 35 lbs; R lateral pinch: 8 lbs: 3 point pinch: 4 lbs  12/26/22: R grip 33 lbs; R lateral pinch: 8 lbs; 3 point pinch: 4 lbs (Pt reports increased fatigue in R hand today following extensive use of the hand over the weekend when using a tool) 02/02/23: R grip 32 lbs; R lateral pinch: 4 lbs, 3 point pinch: 4 lbs (Pt reports using  tools and shovels extensively over the last 2 days, possibly limited by muscle fatigue from overuse) 03/01/23: R grip 30 lbs (more painful today); L grip 96 lbs; R lateral pinch: R 8 lbs, L 27 lbs; 3 point pinch: R 4 lbs,  L 19 lbs 04/12/23: R grip 38 lbs 04/25/23: R grip 39 lbs, R lateral pinch: R: 8 lbs, R 3 point pinch: 3 lbs 05/31/23: R grip 35 lbs, R lateral pinch: R: 9 lbs, R 3 point pinch: 1 lb  COORDINATION: 9 Hole Peg test: Right: 2 min 39 sec; Left: 26 sec 12/26/22: R 48 secs 02/02/23: R 51 sec  03/01/23: R 46 sec  04/26/23: R 42 sec 05/31/23: R 48 sec  SENSATION: Light  touch: Impaired ; ulnar nerve distribution  EDEMA: very mild edema surrounding incision sites on R forearm and elbow   COGNITION: Overall cognitive status: Within functional limits for tasks assessed Areas of impairment: hx of bipolar 1, schizophrenia, and drug and alcohol abuse  OBSERVATIONS:  Pt pleasant, cooperative, and eager to regain strength in his R dominant arm.  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   Note for education/handouts: Pt reports he is limited with his reading and writing ability, but states he will always ask if he doesn't understand something.  TODAY'S TREATMENT:                                                                                                                              DATE: 07/05/23 Therapeutic Exercise: Facilitated RUE flexibility exercises: -Completed passive stretching for R wrist and digit ext, ulnar deviation -Completed passive wall stretch to promote R shoulder abd, elbow ext, forearm pron, and wrist and digit ext; pt required assist to keep digits extended on wall -Facilitated R hand pinch strengthening using therapy resistant clothespins (all colors) for 3 trials each pinch type and color: lateral and 3 point pinch, min vc for pinch patterns and minimizing substitution patterns during 3 point pinching.  -R grip  strengthening with hand gripper set at 23.4# for 3 trials to remove jumbo pegs from pegboard.  Rest and passive wrist and digit extension stretching between trials  Therapeutic Activity: -Facilitated 2 point and 3 point pinch prehension working to [place] ball pegs from pegboard. -Promoted item storage and translatory skills working to pick up 2-3 pegs 1 by 1, store in hand, and discard them 1 by 1 from palm of hand.  PATIENT EDUCATION: Education details: anticipate readiness to d/c next visit  Person educated: Patient Education method: Explanation Education comprehension: verbalized understanding  HOME EXERCISE PROGRAM: Yellow theraputty, wrist/forearm strengthening with 2# dumbbell, ulnar nerve glides  GOALS: Goals reviewed with patient? Yes  SHORT TERM GOALS: Target date: 01/02/23  Pt will be indep to perform HEP for improving distal RUE flexibility, strength, and coordination. Baseline: Eval: Not yet initiated; 12/26/22: pt routinely works on resistive hand strengthening exercises with tools and putty at home, as well as stretching wrist and digits, and FMC activities.  Goal status: achieved  LONG TERM GOALS: Target date: 08/23/23  Pt will increase FOTO score to 64 or better to indicate improvement in self perceived functional use of the R arm with daily tasks (revised on 03/01/23 from 56). Baseline: Eval: 50; 12/26/22: 49; 02/02/23: 50; 03/01/23: 59; 04/26/23: d/c FOTO Goal status: d/c  2.  Pt will increase R grip strength by 15 (revised from 10 lbs on 04/26/23) or more lbs to ease ability carry and transport heavy items securely in R dominant hand. Baseline: Eval: R grip 26 lbs (non-dominant L 95 lbs); 12/26/22: R grip 33 lbs; 02/02/23: R grip 32 lbs; 03/01/23: R grip 30  lbs;  04/26/23: R grip 39 lbs; 05/31/23: R grip 35 lbs Goal status: ongoing  3.  Pt will increase R lateral pinch strength by 5 or more lbs to ease ability to open drink bottles. Baseline: Eval: R 7 lbs  (non-dominant L 32 lbs); 12/26/22: R 8 lbs; 02/02/23: 4 lbs; 03/01/23: R 8 lbs; 04/26/23: R 8 lbs; L hand usually does the twisting to open a juice bottle; 05/31/23: R 9 lbs Goal status: ongoing  4.  Pt will tolerate manual therapy, therapeutic modalities, and exercises to decrease pain in RUE to a reported 2-3/10 pain or less with activity.   Baseline: Eval: R forearm and hand 6-7/10 pain with activity; 12/26/22: Pt reports increased pain today after increased use of R hand over the weekend; still at 6-7/10 pain with activity; 02/02/23: 1/10 pain at rest, 3.5/10 pain with activity; 03/01/23:  2-3/10 pain in the RUE prior to seizure, today reporting 7/10 pain wide spread throughout the body and down RUE, with pt reporting that the last major seizure caused him to hurt for 3 weeks; 04/26/23: 6/10 pain in RUE after feeling cold in therapy clinic; pt reports 2-4/10 pain RUE in general with activity; 05/31/23: 5/10 pain in RUE, ulnar side of distal forearm and hand Goal status: ongoing  5.  Pt will increase R hand FMC/dexterity skills to improve efficiency with clothing fasteners as noted by completion of 9 hole peg test in <40 sec (revised on 12/26/22 from <2 min).  Baseline: Eval: 2 min 39 sec; extra time clothing fasteners, specifically buttons and tying shoe laces; 12/26/22: R 48 sec; 02/02/23: 51 sec; 03/01/23: R 46 sec; 04/26/23: R 42 sec; pt reports improving efficiency with clothing fasteners, though pulling loops of shoe laces tends to still be a challenge; 05/31/23: R 48 sec Goal status: ongoing  ASSESSMENT:   CLINICAL IMPRESSION:   Pt had follow up with neuro surgeon last week to discuss recommendations to move forward with a cervical spinal surgery.  Pt reports frustration that it has been recommended that pt decrease his smoking down to 2 cigarettes a day, as he is currently at 7 per day and doesn't think he can do much better than that.  OT encouraged pt to continue to try to meet this compromise.  OT  discussed probable plan to d/c OT next session d/t assumed max rehab potential met (though will confirm with objective measures next session), and additional OT or PT may be indicated should pt undergo a cervical sx.  Pt verbalized understanding.  Will plan therapy reassessment next session to confirm appropriateness for d/c.    PERFORMANCE DEFICITS: in functional skills including ADLs, IADLs, coordination, dexterity, sensation, edema, ROM, strength, pain, fascial restrictions, muscle spasms, flexibility, Fine motor control, body mechanics, decreased knowledge of use of DME, skin integrity, and UE functional use, and psychosocial skills including coping strategies, environmental adaptation, habits, and routines and behaviors.   IMPAIRMENTS: are limiting patient from ADLs, IADLs, rest and sleep, work, and leisure.   COMORBIDITIES: has co-morbidities such as hx of 6 cervical spinal surgeries, bipolar 1, schizophrenia that affects occupational performance. Patient will benefit from skilled OT to address above impairments and improve overall function.  MODIFICATION OR ASSISTANCE TO COMPLETE EVALUATION: No modification of tasks or assist necessary to complete an evaluation.  OT OCCUPATIONAL PROFILE AND HISTORY: Problem focused assessment: Including review of records relating to presenting problem.  CLINICAL DECISION MAKING: Moderate - several treatment options, min-mod task modification necessary  REHAB POTENTIAL: Good  EVALUATION COMPLEXITY: Moderate    PLAN:  OT FREQUENCY: 1x/week  OT DURATION: 12 weeks  PLANNED INTERVENTIONS: self care/ADL training, therapeutic exercise, therapeutic activity, neuromuscular re-education, manual therapy, scar mobilization, passive range of motion, splinting, electrical stimulation, paraffin, moist heat, cryotherapy, contrast bath, patient/family education, cognitive remediation/compensation, psychosocial skills training, coping strategies training, and DME  and/or AE instructions  RECOMMENDED OTHER SERVICES: None at this time  CONSULTED AND AGREED WITH PLAN OF CARE: Patient  PLAN FOR NEXT SESSION: see above  Marcus Sewer, MS, OTR/L  Casandra Claw, OT 07/06/2023, 4:26 PM   ..

## 2023-07-09 ENCOUNTER — Emergency Department
Admission: EM | Admit: 2023-07-09 | Discharge: 2023-07-09 | Disposition: A | Discharge status: Home / Self Care | Payer: MEDICAID | Attending: Emergency Medicine | Admitting: Emergency Medicine

## 2023-07-09 ENCOUNTER — Emergency Department: Payer: MEDICAID

## 2023-07-09 ENCOUNTER — Other Ambulatory Visit: Payer: Self-pay

## 2023-07-09 DIAGNOSIS — R202 Paresthesia of skin: Secondary | ICD-10-CM | POA: Diagnosis not present

## 2023-07-09 DIAGNOSIS — Y92410 Unspecified street and highway as the place of occurrence of the external cause: Secondary | ICD-10-CM | POA: Insufficient documentation

## 2023-07-09 DIAGNOSIS — Z23 Encounter for immunization: Secondary | ICD-10-CM | POA: Diagnosis not present

## 2023-07-09 DIAGNOSIS — R0789 Other chest pain: Secondary | ICD-10-CM | POA: Insufficient documentation

## 2023-07-09 DIAGNOSIS — M546 Pain in thoracic spine: Secondary | ICD-10-CM | POA: Diagnosis not present

## 2023-07-09 DIAGNOSIS — M542 Cervicalgia: Secondary | ICD-10-CM | POA: Insufficient documentation

## 2023-07-09 DIAGNOSIS — M545 Low back pain, unspecified: Secondary | ICD-10-CM | POA: Insufficient documentation

## 2023-07-09 DIAGNOSIS — M25562 Pain in left knee: Secondary | ICD-10-CM | POA: Diagnosis not present

## 2023-07-09 LAB — URINALYSIS, ROUTINE W REFLEX MICROSCOPIC
Bacteria, UA: NONE SEEN
Bilirubin Urine: NEGATIVE
Glucose, UA: NEGATIVE mg/dL
Hgb urine dipstick: NEGATIVE
Ketones, ur: NEGATIVE mg/dL
Nitrite: NEGATIVE
Protein, ur: NEGATIVE mg/dL
Specific Gravity, Urine: 1.011 (ref 1.005–1.030)
Squamous Epithelial / HPF: 0 /HPF (ref 0–5)
pH: 8 (ref 5.0–8.0)

## 2023-07-09 LAB — CBC WITH DIFFERENTIAL/PLATELET
Abs Immature Granulocytes: 0.03 10*3/uL (ref 0.00–0.07)
Basophils Absolute: 0.1 10*3/uL (ref 0.0–0.1)
Basophils Relative: 1 %
Eosinophils Absolute: 0.2 10*3/uL (ref 0.0–0.5)
Eosinophils Relative: 2 %
HCT: 37.5 % — ABNORMAL LOW (ref 39.0–52.0)
Hemoglobin: 13.2 g/dL (ref 13.0–17.0)
Immature Granulocytes: 0 %
Lymphocytes Relative: 28 %
Lymphs Abs: 2.5 10*3/uL (ref 0.7–4.0)
MCH: 30.3 pg (ref 26.0–34.0)
MCHC: 35.2 g/dL (ref 30.0–36.0)
MCV: 86 fL (ref 80.0–100.0)
Monocytes Absolute: 0.7 10*3/uL (ref 0.1–1.0)
Monocytes Relative: 8 %
Neutro Abs: 5.5 10*3/uL (ref 1.7–7.7)
Neutrophils Relative %: 61 %
Platelets: 230 10*3/uL (ref 150–400)
RBC: 4.36 MIL/uL (ref 4.22–5.81)
RDW: 12.6 % (ref 11.5–15.5)
WBC: 9 10*3/uL (ref 4.0–10.5)
nRBC: 0 % (ref 0.0–0.2)

## 2023-07-09 LAB — BASIC METABOLIC PANEL WITH GFR
Anion gap: 12 (ref 5–15)
BUN: 8 mg/dL (ref 6–20)
CO2: 20 mmol/L — ABNORMAL LOW (ref 22–32)
Calcium: 8.9 mg/dL (ref 8.9–10.3)
Chloride: 99 mmol/L (ref 98–111)
Creatinine, Ser: 0.73 mg/dL (ref 0.61–1.24)
GFR, Estimated: >60 mL/min (ref >60)
Glucose, Bld: 96 mg/dL (ref 70–99)
Potassium: 3.5 mmol/L (ref 3.5–5.1)
Sodium: 131 mmol/L — ABNORMAL LOW (ref 135–145)

## 2023-07-09 LAB — HEPATIC FUNCTION PANEL
ALT: 14 U/L (ref 0–44)
AST: 19 U/L (ref 15–41)
Albumin: 4.1 g/dL (ref 3.5–5.0)
Alkaline Phosphatase: 41 U/L (ref 38–126)
Bilirubin, Direct: 0.1 mg/dL (ref 0.0–0.2)
Indirect Bilirubin: 0.5 mg/dL (ref 0.3–0.9)
Total Bilirubin: 0.6 mg/dL (ref 0.0–1.2)
Total Protein: 7.2 g/dL (ref 6.5–8.1)

## 2023-07-09 LAB — TROPONIN I (HIGH SENSITIVITY): Troponin I (High Sensitivity): 6 ng/L (ref <18)

## 2023-07-09 MED ORDER — IOHEXOL 350 MG/ML SOLN
100.0000 mL | Freq: Once | INTRAVENOUS | Status: AC | PRN
  Administered 2023-07-09: 100 mL via INTRAVENOUS

## 2023-07-09 MED ORDER — SODIUM CHLORIDE 0.9 % IV BOLUS
500.0000 mL | Freq: Once | INTRAVENOUS | Status: AC
  Administered 2023-07-09: 500 mL via INTRAVENOUS

## 2023-07-09 MED ORDER — HYDROMORPHONE HCL 1 MG/ML IJ SOLN
1.0000 mg | Freq: Once | INTRAMUSCULAR | Status: AC
  Administered 2023-07-09: 1 mg via INTRAVENOUS
  Filled 2023-07-09: qty 1

## 2023-07-09 MED ORDER — ONDANSETRON HCL 4 MG/2ML IJ SOLN
4.0000 mg | Freq: Once | INTRAMUSCULAR | Status: AC
  Administered 2023-07-09: 4 mg via INTRAVENOUS
  Filled 2023-07-09: qty 2

## 2023-07-09 MED ORDER — OXYCODONE HCL 5 MG PO TABS: 5.0000 mg | ORAL_TABLET | Freq: Four times a day (QID) | ORAL | 0 refills | Status: AC | PRN

## 2023-07-09 MED ORDER — TETANUS-DIPHTH-ACELL PERTUSSIS 5-2.5-18.5 LF-MCG/0.5 IM SUSY
0.5000 mL | PREFILLED_SYRINGE | Freq: Once | INTRAMUSCULAR | Status: AC
  Administered 2023-07-09: 0.5 mL via INTRAMUSCULAR
  Filled 2023-07-09: qty 0.5

## 2023-07-09 MED ORDER — LORAZEPAM 2 MG/ML IJ SOLN
1.0000 mg | Freq: Once | INTRAMUSCULAR | Status: AC | PRN
  Administered 2023-07-09: 1 mg via INTRAVENOUS
  Filled 2023-07-09: qty 1
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
## 2023-07-09 NOTE — Discharge Instructions (Addendum)
 Return to the ER if you develop worsening symptoms or any other concerns  Take oxycodone  as prescribed. Do not drink alcohol, drive or participate in any other potentially dangerous activities while taking this medication as it may make you sleepy. Do not take this medication with any other sedating medications, either prescription or over-the-counter. If you were prescribed Percocet or Vicodin, do not take these with acetaminophen  (Tylenol ) as it is already contained within these medications.  This medication is an opiate (or narcotic) pain medication and can be habit forming. Use it as little as possible to achieve adequate pain control. Do not use or use it with extreme caution if you have a history of opiate abuse or dependence. If you are on a pain contract with your primary care doctor or a pain specialist, be sure to let them know you were prescribed this medication today from the Emergency Department. This medication is intended for your use only - do not give any to anyone else and keep it in a secure place where nobody else, especially children, have access to it.  CT scan results . No acute intrathoracic, intra-abdominal, intrapelvic traumatic  injury.  2. Please see separately dictated CT thoracolumbar spine.  3. Likely recurrent small fat containing right inguinal hernia.  Small left fat containing inguinal hernia.  4. Small fat containing umbilical hernia.   MRI Results 1. No evidence of acute abnormality by MRI.  2. Similar multilevel degenerative change, greatest in the cervical  spine and detailed above.  3. At C4-C5, moderate to severe canal stenosis and severe right  foraminal stenosis.  4. At C7-T1, severe right and moderate to severe left foraminal  stenosis.  5. Similar small areas of T2 hyperintensity in the cervical cord,  likely myelomalacia.
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
## 2023-07-09 NOTE — ED Notes (Signed)
 Patient to MRI at this time.

## 2023-07-10 ENCOUNTER — Emergency Department
Admission: EM | Admit: 2023-07-10 | Discharge: 2023-07-10 | Disposition: A | Discharge status: Home / Self Care | Payer: MEDICAID | Attending: Emergency Medicine | Admitting: Emergency Medicine

## 2023-07-10 DIAGNOSIS — G8929 Other chronic pain: Secondary | ICD-10-CM | POA: Insufficient documentation

## 2023-07-10 DIAGNOSIS — M549 Dorsalgia, unspecified: Secondary | ICD-10-CM | POA: Diagnosis present
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
## 2023-07-10 NOTE — ED Triage Notes (Addendum)
 Pt has been walking around the lobby for several hours after being discharged due to no ride home. Pt is caox4, in no acute distress and ambulatory in the lobby. Pt walked back up to the desk asking for a place to lay down. Pt then states "I just dont feel good". Pt was Dcd with a c-collar in place but took it off and has been walking around with it in his hands all night. Pt has HX of chronic cervical stenosis.

## 2023-07-11 ENCOUNTER — Other Ambulatory Visit: Payer: Self-pay | Admitting: Neurosurgery

## 2023-07-11 DIAGNOSIS — M792 Neuralgia and neuritis, unspecified: Secondary | ICD-10-CM

## 2023-07-11 DIAGNOSIS — R29898 Other symptoms and signs involving the musculoskeletal system: Secondary | ICD-10-CM

## 2023-07-11 DIAGNOSIS — G5621 Lesion of ulnar nerve, right upper limb: Secondary | ICD-10-CM

## 2023-07-12 ENCOUNTER — Ambulatory Visit: Payer: MEDICAID

## 2023-07-17 ENCOUNTER — Ambulatory Visit: Payer: MEDICAID

## 2023-07-18 ENCOUNTER — Ambulatory Visit: Payer: Self-pay

## 2023-07-18 NOTE — Telephone Encounter (Signed)
 FYI
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
## 2023-07-19 ENCOUNTER — Ambulatory Visit: Payer: MEDICAID

## 2023-07-19 NOTE — Telephone Encounter (Signed)
 Only need a referral from you

## 2023-07-19 NOTE — Telephone Encounter (Signed)
 Appointment scheduled with Dr.Yarbourgh Neurosurgeon

## 2023-07-24 ENCOUNTER — Ambulatory Visit: Payer: MEDICAID

## 2023-07-26 ENCOUNTER — Ambulatory Visit: Payer: MEDICAID

## 2023-07-26 ENCOUNTER — Ambulatory Visit: Payer: MEDICAID | Attending: Neurosurgery

## 2023-07-26 DIAGNOSIS — M25641 Stiffness of right hand, not elsewhere classified: Secondary | ICD-10-CM | POA: Diagnosis present

## 2023-07-26 DIAGNOSIS — R278 Other lack of coordination: Secondary | ICD-10-CM | POA: Diagnosis present

## 2023-07-26 DIAGNOSIS — G5621 Lesion of ulnar nerve, right upper limb: Secondary | ICD-10-CM | POA: Insufficient documentation

## 2023-07-26 DIAGNOSIS — M25631 Stiffness of right wrist, not elsewhere classified: Secondary | ICD-10-CM | POA: Insufficient documentation

## 2023-07-26 DIAGNOSIS — M6281 Muscle weakness (generalized): Secondary | ICD-10-CM | POA: Insufficient documentation
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
## 2023-07-28 ENCOUNTER — Encounter: Payer: Self-pay | Admitting: Physician Assistant

## 2023-07-28 ENCOUNTER — Ambulatory Visit (INDEPENDENT_AMBULATORY_CARE_PROVIDER_SITE_OTHER): Payer: MEDICAID | Admitting: Physician Assistant

## 2023-07-28 DIAGNOSIS — M960 Pseudarthrosis after fusion or arthrodesis: Secondary | ICD-10-CM

## 2023-07-28 DIAGNOSIS — G959 Disease of spinal cord, unspecified: Secondary | ICD-10-CM | POA: Diagnosis not present

## 2023-07-28 DIAGNOSIS — R29898 Other symptoms and signs involving the musculoskeletal system: Secondary | ICD-10-CM

## 2023-07-28 DIAGNOSIS — G5621 Lesion of ulnar nerve, right upper limb: Secondary | ICD-10-CM

## 2023-07-28 DIAGNOSIS — M4802 Spinal stenosis, cervical region: Secondary | ICD-10-CM

## 2023-07-28 DIAGNOSIS — M792 Neuralgia and neuritis, unspecified: Secondary | ICD-10-CM

## 2023-07-28 MED ORDER — TIZANIDINE HCL 4 MG PO TABS: 4.0000 mg | ORAL_TABLET | Freq: Three times a day (TID) | ORAL | 1 refills | Status: DC

## 2023-07-28 MED ORDER — GABAPENTIN 300 MG PO CAPS: 600.0000 mg | ORAL_CAPSULE | Freq: Three times a day (TID) | ORAL | 3 refills | Status: DC

## 2023-07-31 ENCOUNTER — Ambulatory Visit: Payer: MEDICAID | Admitting: Internal Medicine

## 2023-07-31 ENCOUNTER — Ambulatory Visit: Payer: MEDICAID

## 2023-08-02 ENCOUNTER — Ambulatory Visit: Payer: MEDICAID

## 2023-08-03 ENCOUNTER — Ambulatory Visit: Payer: MEDICAID | Admitting: Internal Medicine

## 2023-08-03 ENCOUNTER — Encounter: Payer: Self-pay | Admitting: Internal Medicine

## 2023-08-03 ENCOUNTER — Other Ambulatory Visit: Payer: Self-pay

## 2023-08-03 VITALS — BP 122/84 | HR 96 | Temp 97.8°F | Resp 18 | Ht 73.0 in | Wt 236.9 lb

## 2023-08-03 DIAGNOSIS — M25562 Pain in left knee: Secondary | ICD-10-CM
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
## 2023-08-07 ENCOUNTER — Ambulatory Visit: Payer: MEDICAID

## 2023-08-09 ENCOUNTER — Ambulatory Visit: Payer: MEDICAID
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
## 2023-08-10 ENCOUNTER — Encounter: Payer: Self-pay | Admitting: Neurosurgery

## 2023-08-10 ENCOUNTER — Ambulatory Visit: Payer: MEDICAID | Admitting: Neurosurgery

## 2023-08-10 ENCOUNTER — Other Ambulatory Visit: Payer: Self-pay

## 2023-08-10 VITALS — BP 130/82 | Ht 73.0 in | Wt 236.0 lb

## 2023-08-10 DIAGNOSIS — G959 Disease of spinal cord, unspecified: Secondary | ICD-10-CM | POA: Diagnosis not present

## 2023-08-10 DIAGNOSIS — M4802 Spinal stenosis, cervical region: Secondary | ICD-10-CM

## 2023-08-10 DIAGNOSIS — Z01818 Encounter for other preprocedural examination: Secondary | ICD-10-CM

## 2023-08-10 DIAGNOSIS — M960 Pseudarthrosis after fusion or arthrodesis: Secondary | ICD-10-CM

## 2023-08-10 NOTE — Patient Instructions (Signed)
 Please see below for information in regards to your upcoming surgery:   Planned surgery: C3-T1 posterior spinal fusion, C3-5 posterior spinal decompression, C7-T1 laminoforaminotomy   Surgery date: 09/08/23 at [facility] (Medical Mall: [address] [place], [place] [address]) - you will find out your arrival time the business day before your surgery.   Pre-op appointment at [facility] Pre-admit Testing: you will receive a call with a date/time for this appointment. If you are scheduled for an in person appointment, Pre-admit Testing is located on the first floor of the [facility] building, [address], [address]. During this appointment, they will advise you which medications you can take the morning of surgery, and which medications you will need to hold for surgery. Labs (such as blood work, EKG) may be done at your pre-op appointment. You are not required to fast for these labs. Should you need to change your pre-op appointment, please call Pre-admit testing at [phone].      Surgical clearance: we will send a clearance form to Dr Bernardo. They may wish to see you in their office prior to signing the clearance form. If so, they may call you to schedule an appointment.     NSAIDS (Non-steroidal anti-inflammatory drugs): because you are having a fusion, please avoid taking any NSAIDS (examples: ibuprofen , motrin , aleve, naproxen, meloxicam , diclofenac ) for 3 months after surgery. Celebrex is an exception and is OK to take, if prescribed. Tylenol  is not an NSAID.    Common restrictions after surgery: No bending, lifting, or twisting ("BLT"). Avoid lifting objects heavier than 10 pounds for the first 6 weeks after surgery. Where possible, avoid household activities that involve lifting, bending, reaching, pushing, or pulling such as laundry, vacuuming, grocery shopping, and childcare. Try to arrange for help from friends and family for these  activities while you heal. Do not drive while taking prescription pain medication. Weeks 6 through 12 after surgery: avoid lifting more than 25 pounds.    X-rays after surgery: Because you are having a fusion: for appointments after your 2 week follow-up: please arrive at the [facility] outpatient imaging center ([address], [place]) or [facility] one hour prior to your appointment for x-rays. This applies to every appointment after your 2 week follow-up. Failure to do so may result in your appointment being rescheduled.   How to contact us :  If you have any questions/concerns before or after surgery, you can reach us  at [phone], or you can send a mychart message. We can be reached by phone or mychart 8am-4pm, Monday-Friday.  *Please note: Calls after 4pm are forwarded to a third party answering service. Mychart messages are not routinely monitored during evenings, weekends, and holidays. Please call our office to contact the answering service for urgent concerns during non-business hours.    If you have FMLA/disability paperwork, please drop it off or fax it to [phone], attention Patty.   Appointments/FMLA & disability paperwork: Odetta Mora, & Ritta Registered Nurses/Surgery schedulers: Braxden Lovering & Lauren Medical Assistants: Damien Scott Howell Physician Assistants: Lyle Decamp, PA-C, Edsel Goods, PA-C & Glade Boys, PA-C Surgeons: Reeves Daisy, MD & Penne Sharps, MD   [facility] PREADMIT TESTING VISIT and SURGERY INFORMATION SHEET   Now that surgery has been scheduled you can anticipate several phone calls from [facility] services. A pharmacy technician will call you to verify your current list of medications taken at home.  The Pre-Service Center will call to verify your insurance information and to give you billing estimates and information.             The Preadmit Testing Office will be  calling to schedule a visit to obtain information for the anesthesia team and provide instructions on preparation for surgery.  What can you expect for the Preadmit Testing Visit: Appointments may be scheduled in-person or by telephone.  If a telephone visit is scheduled, you may be asked to come into the office to have lab tests or other studies performed.   This visit will not be completed any greater than 14 days prior to your surgery.  If your surgery has been scheduled for a future date, please do not be alarmed if we have not contacted you to schedule an appointment more than a month prior to the surgery date.    Please be prepared to provide the following information during this appointment:            -Personal medical history                                               -Medication and allergy list            -Any history of problems with anesthesia              -Recent lab work or diagnostic studies            -Please notify us  of any needs we should be aware of to provide the best care possible           -You will be provided with instructions on how to prepare for your surgery.    On The Day of Surgery:  You must have a driver to take you home after surgery, you will be asked not to drive for 24 hours following surgery.  Taxi, Gisele and non-medical transport will not be acceptable means of transportation unless you have a responsible individual who will be traveling with you.  Visitors in the surgical area:   2 people will be able to visit you in your room once your preparation for surgery has been completed. During surgery, your visitors will be asked to wait in the Surgery Waiting Area.  It is not a requirement for them to stay, if they prefer to leave and come back.  Your visitor(s) will be given an update once the surgery has been completed.  No visitors are allowed in the initial recovery room to respect patient privacy and safety.  Once you are more awake and transfer to the  secondary recovery area, or are transferred to an inpatient room, visitors will again be able to see you.  To respect and protect your privacy: We will ask on the day of surgery who your driver will be and what the contact number for that individual will be. We will ask if it is okay to share information with this individual, or if there is an alternative individual that we, or the surgeon, should contact to provide updates and information. If family or friends come to the surgical information desk requesting information about you, who you have not listed with us , no information will be given.   It may be helpful to designate someone as the main contact who will be responsible for updating your other friends  and family.    PREADMIT TESTING OFFICE: [phone] SAME DAY SURGERY: [phone] We look forward to caring for you before and throughout the process of your surgery.

## 2023-08-10 NOTE — Addendum Note (Signed)
 Addended by: Aileene Lanum on: 08/10/2023 09:06 AM   Modules accepted: Orders

## 2023-08-14 ENCOUNTER — Ambulatory Visit: Payer: MEDICAID

## 2023-08-16 ENCOUNTER — Ambulatory Visit: Payer: MEDICAID

## 2023-08-20 ENCOUNTER — Other Ambulatory Visit: Payer: Self-pay | Admitting: Physician Assistant

## 2023-08-21 ENCOUNTER — Ambulatory Visit: Payer: MEDICAID

## 2023-08-21 NOTE — Telephone Encounter (Signed)
 90 day supply request from the pharmacy on the medication

## 2023-08-23 ENCOUNTER — Ambulatory Visit: Payer: MEDICAID

## 2023-08-28 ENCOUNTER — Ambulatory Visit: Payer: MEDICAID

## 2023-08-28 ENCOUNTER — Other Ambulatory Visit: Payer: Self-pay

## 2023-08-28 ENCOUNTER — Encounter: Payer: Self-pay | Admitting: Urgent Care

## 2023-08-28 ENCOUNTER — Encounter
Admission: RE | Admit: 2023-08-28 | Discharge: 2023-08-28 | Disposition: A | Discharge status: Home / Self Care | Payer: MEDICAID | Source: Ambulatory Visit | Attending: Neurosurgery | Admitting: Neurosurgery

## 2023-08-28 ENCOUNTER — Encounter: Payer: Self-pay | Admitting: Neurosurgery

## 2023-08-28 ENCOUNTER — Encounter: Payer: MEDICAID | Admitting: Internal Medicine

## 2023-08-28 ENCOUNTER — Encounter: Payer: Self-pay | Admitting: Internal Medicine

## 2023-08-28 VITALS — BP 116/83 | HR 73 | Resp 14 | Ht 73.0 in | Wt 239.1 lb

## 2023-08-28 DIAGNOSIS — Z01818 Encounter for other preprocedural examination: Secondary | ICD-10-CM

## 2023-08-28 DIAGNOSIS — Z01812 Encounter for preprocedural laboratory examination: Secondary | ICD-10-CM | POA: Insufficient documentation

## 2023-08-28 HISTORY — DX: Illiteracy and low-level literacy: Z55.0

## 2023-08-28 LAB — SURGICAL PCR SCREEN
MRSA, PCR: NEGATIVE
Staphylococcus aureus: NEGATIVE

## 2023-08-28 LAB — TYPE AND SCREEN
ABO/RH(D): O POS
Antibody Screen: NEGATIVE

## 2023-08-28 NOTE — Progress Notes (Deleted)
   Established Patient Office Visit  Subjective   Patient ID: Scott Howell, male    DOB: [birth year]  Age: 53 y.o. MRN: [id number]  Chief Complaint  Patient presents with   Surgery Consult    HPI  {History (Optional):23778}  ROS    Objective:     BP 120/70 (Cuff Size: Large)  {Vitals History (Optional):23777}  Physical Exam Constitutional:      Appearance: Normal appearance.  HENT:     Head: Normocephalic and atraumatic.  Eyes:     Conjunctiva/sclera: Conjunctivae normal.  Neck:     Comments: No thyromegaly Cardiovascular:     Rate and Rhythm: Normal rate and regular rhythm.  Pulmonary:     Effort: Pulmonary effort is normal.     Breath sounds: Normal breath sounds.  Musculoskeletal:     Cervical back: No tenderness.     Right lower leg: No edema.     Left lower leg: No edema.  Lymphadenopathy:     Cervical: No cervical adenopathy.  Skin:    General: Skin is warm and dry.  Neurological:     General: No focal deficit present.     Mental Status: He is alert. Mental status is at baseline.  Psychiatric:        Mood and Affect: Mood normal.        Behavior: Behavior normal.      No results found for any visits on 08/28/23.  {Labs (Optional):23779}  The ASCVD Risk score (Arnett DK, et al., 2019) failed to calculate for the following reasons:   Risk score cannot be calculated because patient has a medical history suggesting prior/existing ASCVD    Assessment & Plan:  There are no diagnoses linked to this encounter.   No follow-ups on file.    Sharyle Fischer, DO

## 2023-08-28 NOTE — Progress Notes (Signed)
 This encounter was created in error - please disregard.

## 2023-08-28 NOTE — Patient Instructions (Addendum)
 Your procedure is scheduled on:09-08-23 Friday Report to the Registration Desk on the 1st floor of the [facility].Then proceed to the 2nd floor Surgery Desk To find out your arrival time, please call [phone] between 1PM - 3PM on:09-07-23 Thursday If your arrival time is 6:00 am, do not arrive before that time as the [facility] entrance doors do not open until 6:00 am.  REMEMBER: Instructions that are not followed completely may result in serious medical risk, up to and including death; or upon the discretion of your surgeon and anesthesiologist your surgery may need to be rescheduled.  Do not eat food after midnight the night before surgery.  No gum chewing or hard candies.  You may however, drink CLEAR liquids up to 2 hours before you are scheduled to arrive for your surgery. Do not drink anything within 2 hours of your scheduled arrival time.  Clear liquids include: - water   - apple juice without pulp - gatorade (not RED colors) - black coffee or tea (Do NOT add milk or creamers to the coffee or tea) Do NOT drink anything that is not on this list  One week prior to surgery: Stop ANY OVER THE COUNTER supplements until after surgery  Continue taking all of your other prescription medications up until the day of surgery.  ON THE DAY OF SURGERY ONLY TAKE THESE MEDICATIONS WITH SIPS OF WATER : -gabapentin  (NEURONTIN )   Use your Symbicort  and Albuterol  Nebulizer the day of surgery and bring your Albuterol  Inhaler to the hospital  No Alcohol for 24 hours before or after surgery.  No Smoking including e-cigarettes for 24 hours before surgery.  No chewable tobacco products for at least 6 hours before surgery.  No nicotine  patches on the day of surgery.  Do not use any recreational drugs for at least a week (preferably 2 weeks) before your surgery.  Please be advised that the combination of cocaine and anesthesia may have negative outcomes, up to and including death. If you  test positive for cocaine, your surgery will be cancelled.  On the morning of surgery brush your teeth with toothpaste and water , you may rinse your mouth with mouthwash if you wish. Do not swallow any toothpaste or mouthwash.  Use CHG Soap as directed on instruction sheet.  Do not wear jewelry, make-up, hairpins, clips or nail polish.  For welded (permanent) jewelry: bracelets, anklets, waist bands, etc.  Please have this removed prior to surgery.  If it is not removed, there is a chance that hospital personnel will need to cut it off on the day of surgery.  Do not wear lotions, powders, or perfumes.   Do not shave body hair from the neck down 48 hours before surgery.  Contact lenses, hearing aids and dentures may not be worn into surgery.  Do not bring valuables to the hospital. [facility] is not responsible for any missing/lost belongings or valuables.   Notify your doctor if there is any change in your medical condition (cold, fever, infection).  Wear comfortable clothing (specific to your surgery type) to the hospital.  After surgery, you can help prevent lung complications by doing breathing exercises.  Take deep breaths and cough every 1-2 hours. Your doctor may order a device called an Incentive Spirometer to help you take deep breaths. When coughing or sneezing, hold a pillow firmly against your incision with both hands. This is called "splinting." Doing this helps protect your incision. It also decreases belly discomfort.  If you are being  admitted to the hospital overnight, leave your suitcase in the car. After surgery it may be brought to your room.  In case of increased patient census, it may be necessary for you, the patient, to continue your postoperative care in the Same Day Surgery department.  If you are being discharged the day of surgery, you will not be allowed to drive home. You will need a responsible individual to drive you home and stay with you for 24 hours  after surgery.   If you are taking public transportation, you will need to have a responsible individual with you.  Please call the Pre-admissions Testing Dept. at [phone] if you have any questions about these instructions.  Surgery Visitation Policy:  Patients having surgery or a procedure may have two visitors.  Children under the age of 73 must have an adult with them who is not the patient.  Inpatient Visitation:    Visiting hours are 7 a.m. to 8 p.m. Up to four visitors are allowed at one time in a patient room. The visitors may rotate out with other people during the day.  One visitor age 68 or older may stay with the patient overnight and must be in the room by 8 p.m.    Pre-operative 5 CHG Bath Instructions   You can play a key role in reducing the risk of infection after surgery. Your skin needs to be as free of germs as possible. You can reduce the number of germs on your skin by washing with CHG (chlorhexidine  gluconate) soap before surgery. CHG is an antiseptic soap that kills germs and continues to kill germs even after washing.   DO NOT use if you have an allergy to chlorhexidine /CHG or antibacterial soaps. If your skin becomes reddened or irritated, stop using the CHG and notify one of our RNs at [phone].   Please shower with the CHG soap starting 4 days before surgery using the following schedule:     Please keep in mind the following:  DO NOT shave, including legs and underarms, starting the day of your first shower.   You may shave your face at any point before/day of surgery.  Place clean sheets on your bed the day you start using CHG soap. Use a clean washcloth (not used since being washed) for each shower. DO NOT sleep with pets once you start using the CHG.   CHG Shower Instructions:  If you choose to wash your hair and private area, wash first with your normal shampoo/soap.  After you use shampoo/soap, rinse your hair and body thoroughly to  remove shampoo/soap residue.  Turn the water  OFF and apply about 3 tablespoons (45 ml) of CHG soap to a CLEAN washcloth.  Apply CHG soap ONLY FROM YOUR NECK DOWN TO YOUR TOES (washing for 3-5 minutes)  DO NOT use CHG soap on face, private areas, open wounds, or sores.  Pay special attention to the area where your surgery is being performed.  If you are having back surgery, having someone wash your back for you may be helpful. Wait 2 minutes after CHG soap is applied, then you may rinse off the CHG soap.  Pat dry with a clean towel  Put on clean clothes/pajamas   If you choose to wear lotion, please use ONLY the CHG-compatible lotions on the back of this paper.     Additional instructions for the day of surgery: DO NOT APPLY any lotions, deodorants, cologne, or perfumes.   Put on clean/comfortable clothes.  Brush your teeth.  Ask your nurse before applying any prescription medications to the skin.      CHG Compatible Lotions   Aveeno Moisturizing lotion  Cetaphil Moisturizing Cream  Cetaphil Moisturizing Lotion  Clairol Herbal Essence Moisturizing Lotion, Dry Skin  Clairol Herbal Essence Moisturizing Lotion, Extra Dry Skin  Clairol Herbal Essence Moisturizing Lotion, Normal Skin  Curel Age Defying Therapeutic Moisturizing Lotion with Alpha Hydroxy  Curel Extreme Care Body Lotion  Curel Soothing Hands Moisturizing Hand Lotion  Curel Therapeutic Moisturizing Cream, Fragrance-Free  Curel Therapeutic Moisturizing Lotion, Fragrance-Free  Curel Therapeutic Moisturizing Lotion, Original Formula  Eucerin Daily Replenishing Lotion  Eucerin Dry Skin Therapy Plus Alpha Hydroxy Crme  Eucerin Dry Skin Therapy Plus Alpha Hydroxy Lotion  Eucerin Original Crme  Eucerin Original Lotion  Eucerin Plus Crme Eucerin Plus Lotion  Eucerin TriLipid Replenishing Lotion  Keri Anti-Bacterial Hand Lotion  Keri Deep Conditioning Original Lotion Dry Skin Formula Softly Scented  Keri Deep Conditioning  Original Lotion, Fragrance Free Sensitive Skin Formula  Keri Lotion Fast Absorbing Fragrance Free Sensitive Skin Formula  Keri Lotion Fast Absorbing Softly Scented Dry Skin Formula  Keri Original Lotion  Keri Skin Renewal Lotion Keri Silky Smooth Lotion  Keri Silky Smooth Sensitive Skin Lotion  Nivea Body Creamy Conditioning Oil  Nivea Body Extra Enriched Teacher, adult education Moisturizing Lotion Nivea Crme  Nivea Skin Firming Lotion  NutraDerm 30 Skin Lotion  NutraDerm Skin Lotion  NutraDerm Therapeutic Skin Cream  NutraDerm Therapeutic Skin Lotion  ProShield Protective Hand Cream  Provon moisturizing lotion  Merchandiser, retail to address health-related social needs:  [email or web address]

## 2023-08-30 ENCOUNTER — Ambulatory Visit: Payer: MEDICAID

## 2023-09-04 ENCOUNTER — Ambulatory Visit: Payer: MEDICAID

## 2023-09-04 ENCOUNTER — Ambulatory Visit (INDEPENDENT_AMBULATORY_CARE_PROVIDER_SITE_OTHER): Payer: MEDICAID | Admitting: Physician Assistant

## 2023-09-04 ENCOUNTER — Encounter: Payer: Self-pay | Admitting: Physician Assistant

## 2023-09-04 ENCOUNTER — Telehealth: Payer: Self-pay

## 2023-09-04 DIAGNOSIS — M48061 Spinal stenosis, lumbar region without neurogenic claudication: Secondary | ICD-10-CM

## 2023-09-04 DIAGNOSIS — R29898 Other symptoms and signs involving the musculoskeletal system: Secondary | ICD-10-CM | POA: Diagnosis not present

## 2023-09-04 DIAGNOSIS — M960 Pseudarthrosis after fusion or arthrodesis: Secondary | ICD-10-CM | POA: Diagnosis not present

## 2023-09-04 DIAGNOSIS — G5621 Lesion of ulnar nerve, right upper limb: Secondary | ICD-10-CM | POA: Diagnosis not present

## 2023-09-04 DIAGNOSIS — M792 Neuralgia and neuritis, unspecified: Secondary | ICD-10-CM

## 2023-09-04 MED ORDER — TRAMADOL HCL 50 MG PO TABS
50.0000 mg | ORAL_TABLET | Freq: Four times a day (QID) | ORAL | 0 refills | Status: DC | PRN
Start: 2023-09-04 — End: 2023-09-05

## 2023-09-04 MED ORDER — GABAPENTIN 300 MG PO CAPS: 900.0000 mg | ORAL_CAPSULE | Freq: Three times a day (TID) | ORAL | 3 refills | Status: AC
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
## 2023-09-05 MED ORDER — TRAMADOL HCL 50 MG PO TABS: 50.0000 mg | ORAL_TABLET | Freq: Four times a day (QID) | ORAL | 0 refills | Status: AC | PRN

## 2023-09-05 NOTE — Telephone Encounter (Signed)
 Spoke to [place] at [facility] pharmacy to clarify situation with Tramadol . Per Vernell patient is locked in Medicaid, which means his Medicaid is not allowing pharmacy to fill this or any other controlled pain medication under Brooke's name as it may mean that her name is not added to Medicaid under one of his providers, it is also triggering Medicaid system that he received Oxycodone  on 07/29/23 from Ronal Lewandowsky, MD and then received refills From [facility] prior to that and limiting how many provider names they will approve to refill controlled medications.   2 options are: 1-trying to have [facility] re send this medication and see if this goes through as his insurance recognizes her or 2-patient would need to call Medicaid and have them add Brooke as the provider but that may not be an automatic approval. How to proceed?

## 2023-09-06 ENCOUNTER — Ambulatory Visit: Payer: MEDICAID

## 2023-09-07 ENCOUNTER — Telehealth: Payer: Self-pay

## 2023-09-07 ENCOUNTER — Other Ambulatory Visit: Payer: Self-pay

## 2023-09-07 DIAGNOSIS — Z01818 Encounter for other preprocedural examination: Secondary | ICD-10-CM
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
## 2023-09-08 ENCOUNTER — Encounter: Admission: RE | Payer: Self-pay | Source: Home / Self Care

## 2023-09-08 ENCOUNTER — Inpatient Hospital Stay: Admission: RE | Admit: 2023-09-08 | Payer: MEDICAID | Source: Home / Self Care | Admitting: Neurosurgery

## 2023-09-08 HISTORY — DX: Other psychoactive substance abuse, uncomplicated: F19.10

## 2023-09-08 HISTORY — DX: Other cerebrovascular disease: I67.89

## 2023-09-08 SURGERY — POSTERIOR CERVICAL FUSION/FORAMINOTOMY LEVEL 5
Anesthesia: General

## 2023-09-17 MED ORDER — CEFAZOLIN IN SODIUM CHLORIDE 2-0.9 GM/100ML-% IV SOLN
2.0000 g | Freq: Once | INTRAVENOUS | Status: DC
  Filled 2023-09-17: qty 100

## 2023-09-17 MED ORDER — LACTATED RINGERS IV SOLN: INTRAVENOUS | Status: DC

## 2023-09-17 MED ORDER — CHLORHEXIDINE GLUCONATE 0.12 % MT SOLN
15.0000 mL | Freq: Once | OROMUCOSAL | Status: AC
  Administered 2023-09-18: 15 mL via OROMUCOSAL

## 2023-09-17 MED ORDER — CEFAZOLIN SODIUM-DEXTROSE 2-4 GM/100ML-% IV SOLN
2.0000 g | INTRAVENOUS | Status: AC
  Administered 2023-09-18: 2 g via INTRAVENOUS

## 2023-09-17 MED ORDER — ORAL CARE MOUTH RINSE: 15.0000 mL | Freq: Once | OROMUCOSAL | Status: AC

## 2023-09-18 ENCOUNTER — Encounter: Payer: Self-pay | Admitting: Neurosurgery

## 2023-09-18 ENCOUNTER — Inpatient Hospital Stay
Admission: RE | Admit: 2023-09-18 | Discharge: 2023-09-21 | DRG: 472 | Disposition: A | Discharge status: Home / Self Care | Payer: MEDICAID | Attending: Neurosurgery | Admitting: Neurosurgery

## 2023-09-18 ENCOUNTER — Inpatient Hospital Stay: Payer: MEDICAID

## 2023-09-18 ENCOUNTER — Other Ambulatory Visit: Payer: Self-pay

## 2023-09-18 ENCOUNTER — Encounter
Admission: RE | Disposition: A | Discharge status: Home / Self Care | Payer: Self-pay | Source: Home / Self Care | Attending: Neurosurgery

## 2023-09-18 DIAGNOSIS — M797 Fibromyalgia: Secondary | ICD-10-CM | POA: Diagnosis present

## 2023-09-18 DIAGNOSIS — E7800 Pure hypercholesterolemia, unspecified: Secondary | ICD-10-CM | POA: Diagnosis present

## 2023-09-18 DIAGNOSIS — F17210 Nicotine dependence, cigarettes, uncomplicated: Secondary | ICD-10-CM | POA: Diagnosis present

## 2023-09-18 DIAGNOSIS — Z01818 Encounter for other preprocedural examination: Secondary | ICD-10-CM

## 2023-09-18 DIAGNOSIS — M4802 Spinal stenosis, cervical region: Secondary | ICD-10-CM | POA: Diagnosis present

## 2023-09-18 DIAGNOSIS — Z83438 Family history of other disorder of lipoprotein metabolism and other lipidemia: Secondary | ICD-10-CM | POA: Diagnosis not present

## 2023-09-18 DIAGNOSIS — Z8249 Family history of ischemic heart disease and other diseases of the circulatory system: Secondary | ICD-10-CM

## 2023-09-18 DIAGNOSIS — F209 Schizophrenia, unspecified: Secondary | ICD-10-CM | POA: Diagnosis present

## 2023-09-18 DIAGNOSIS — J4489 Other specified chronic obstructive pulmonary disease: Secondary | ICD-10-CM | POA: Diagnosis present

## 2023-09-18 DIAGNOSIS — M4803 Spinal stenosis, cervicothoracic region: Secondary | ICD-10-CM | POA: Diagnosis present

## 2023-09-18 DIAGNOSIS — Z8673 Personal history of transient ischemic attack (TIA), and cerebral infarction without residual deficits: Secondary | ICD-10-CM

## 2023-09-18 DIAGNOSIS — Z833 Family history of diabetes mellitus: Secondary | ICD-10-CM | POA: Diagnosis not present

## 2023-09-18 DIAGNOSIS — I10 Essential (primary) hypertension: Secondary | ICD-10-CM | POA: Diagnosis present

## 2023-09-18 DIAGNOSIS — Y838 Other surgical procedures as the cause of abnormal reaction of the patient, or of later complication, without mention of misadventure at the time of the procedure: Secondary | ICD-10-CM | POA: Diagnosis present

## 2023-09-18 DIAGNOSIS — G992 Myelopathy in diseases classified elsewhere: Secondary | ICD-10-CM | POA: Diagnosis present

## 2023-09-18 DIAGNOSIS — G959 Disease of spinal cord, unspecified: Secondary | ICD-10-CM

## 2023-09-18 DIAGNOSIS — Z96652 Presence of left artificial knee joint: Secondary | ICD-10-CM | POA: Diagnosis present

## 2023-09-18 DIAGNOSIS — M960 Pseudarthrosis after fusion or arthrodesis: Secondary | ICD-10-CM | POA: Diagnosis present

## 2023-09-18 DIAGNOSIS — K219 Gastro-esophageal reflux disease without esophagitis: Secondary | ICD-10-CM | POA: Diagnosis present

## 2023-09-18 DIAGNOSIS — Z981 Arthrodesis status: Principal | ICD-10-CM

## 2023-09-18 DIAGNOSIS — Z8616 Personal history of COVID-19: Secondary | ICD-10-CM | POA: Diagnosis not present

## 2023-09-18 DIAGNOSIS — G894 Chronic pain syndrome: Secondary | ICD-10-CM | POA: Diagnosis present

## 2023-09-18 LAB — TYPE AND SCREEN
ABO/RH(D): O POS
Antibody Screen: NEGATIVE

## 2023-09-18 SURGERY — POSTERIOR CERVICAL FUSION/FORAMINOTOMY LEVEL 5
Anesthesia: General | Site: Spine Cervical

## 2023-09-18 MED ORDER — DIVALPROEX SODIUM 250 MG PO DR TAB
250.0000 mg | DELAYED_RELEASE_TABLET | Freq: Two times a day (BID) | ORAL | Status: DC
  Administered 2023-09-19 – 2023-09-21 (×5): 250 mg via ORAL
  Filled 2023-09-18 (×5): qty 1

## 2023-09-18 MED ORDER — ONDANSETRON HCL 4 MG/2ML IJ SOLN
INTRAMUSCULAR | Status: DC | PRN
  Administered 2023-09-18: 4 mg via INTRAVENOUS

## 2023-09-18 MED ORDER — 0.9 % SODIUM CHLORIDE (POUR BTL) OPTIME
TOPICAL | Status: DC | PRN
  Administered 2023-09-18: 300 mL

## 2023-09-18 MED ORDER — QUETIAPINE FUMARATE 200 MG PO TABS
500.0000 mg | ORAL_TABLET | Freq: Every day | ORAL | Status: DC
  Administered 2023-09-19 – 2023-09-20 (×3): 500 mg via ORAL
  Filled 2023-09-18 (×3): qty 1

## 2023-09-18 MED ORDER — REMIFENTANIL HCL 1 MG IV SOLR
INTRAVENOUS | Status: DC | PRN
  Administered 2023-09-18: .2 ug/kg/min via INTRAVENOUS

## 2023-09-18 MED ORDER — BUPIVACAINE HCL (PF) 0.5 % IJ SOLN
INTRAMUSCULAR | Status: AC
  Filled 2023-09-18: qty 30

## 2023-09-18 MED ORDER — BUPIVACAINE-EPINEPHRINE (PF) 0.5% -1:200000 IJ SOLN
INTRAMUSCULAR | Status: DC | PRN
  Administered 2023-09-18: 6 mL via PERINEURAL

## 2023-09-18 MED ORDER — NICOTINE 7 MG/24HR TD PT24
7.0000 mg | MEDICATED_PATCH | Freq: Every day | TRANSDERMAL | Status: DC
  Administered 2023-09-18 – 2023-09-20 (×3): 7 mg via TRANSDERMAL
  Filled 2023-09-18 (×3): qty 1

## 2023-09-18 MED ORDER — KETOROLAC TROMETHAMINE 15 MG/ML IJ SOLN
15.0000 mg | Freq: Four times a day (QID) | INTRAMUSCULAR | Status: AC
  Administered 2023-09-19 (×4): 15 mg via INTRAVENOUS
  Filled 2023-09-18 (×4): qty 1

## 2023-09-18 MED ORDER — ONDANSETRON HCL 4 MG/2ML IJ SOLN
INTRAMUSCULAR | Status: AC
  Filled 2023-09-18: qty 2

## 2023-09-18 MED ORDER — OXYCODONE HCL 5 MG PO TABS
10.0000 mg | ORAL_TABLET | ORAL | Status: DC | PRN
  Administered 2023-09-19 – 2023-09-21 (×13): 10 mg via ORAL
  Filled 2023-09-18 (×14): qty 2

## 2023-09-18 MED ORDER — PHENOL 1.4 % MT LIQD: 1.0000 | OROMUCOSAL | Status: DC | PRN

## 2023-09-18 MED ORDER — SENNA 8.6 MG PO TABS
1.0000 | ORAL_TABLET | Freq: Two times a day (BID) | ORAL | Status: DC
  Administered 2023-09-19 – 2023-09-21 (×6): 8.6 mg via ORAL
  Filled 2023-09-18 (×6): qty 1

## 2023-09-18 MED ORDER — FAMOTIDINE 20 MG PO TABS
40.0000 mg | ORAL_TABLET | Freq: Every day | ORAL | Status: DC
  Administered 2023-09-19 – 2023-09-21 (×3): 40 mg via ORAL
  Filled 2023-09-18 (×3): qty 2

## 2023-09-18 MED ORDER — DOCUSATE SODIUM 100 MG PO CAPS
100.0000 mg | ORAL_CAPSULE | Freq: Two times a day (BID) | ORAL | Status: DC
  Administered 2023-09-19 – 2023-09-21 (×6): 100 mg via ORAL
  Filled 2023-09-18 (×6): qty 1

## 2023-09-18 MED ORDER — FENTANYL CITRATE (PF) 100 MCG/2ML IJ SOLN
INTRAMUSCULAR | Status: AC
  Filled 2023-09-18: qty 2

## 2023-09-18 MED ORDER — VANCOMYCIN HCL 1000 MG IV SOLR
INTRAVENOUS | Status: AC
  Filled 2023-09-18: qty 20

## 2023-09-18 MED ORDER — SORBITOL 70 % SOLN: 30.0000 mL | Freq: Every day | Status: DC | PRN

## 2023-09-18 MED ORDER — REMIFENTANIL HCL 1 MG IV SOLR
INTRAVENOUS | Status: AC
  Filled 2023-09-18: qty 1000

## 2023-09-18 MED ORDER — PROPOFOL 1000 MG/100ML IV EMUL
INTRAVENOUS | Status: AC
  Filled 2023-09-18: qty 100

## 2023-09-18 MED ORDER — MOMETASONE FURO-FORMOTEROL FUM 200-5 MCG/ACT IN AERO
2.0000 | INHALATION_SPRAY | Freq: Two times a day (BID) | RESPIRATORY_TRACT | Status: DC
  Administered 2023-09-19 – 2023-09-21 (×5): 2 via RESPIRATORY_TRACT
  Filled 2023-09-18: qty 8.8

## 2023-09-18 MED ORDER — LIDOCAINE HCL (PF) 2 % IJ SOLN
INTRAMUSCULAR | Status: AC
  Filled 2023-09-18: qty 5

## 2023-09-18 MED ORDER — SURGIFLO WITH THROMBIN (HEMOSTATIC MATRIX KIT) OPTIME
TOPICAL | Status: DC | PRN
  Administered 2023-09-18: 1 via TOPICAL

## 2023-09-18 MED ORDER — SODIUM CHLORIDE (PF) 0.9 % IJ SOLN
INTRAMUSCULAR | Status: DC | PRN
  Administered 2023-09-18: 60 mL via SURGICAL_CAVITY

## 2023-09-18 MED ORDER — DEXMEDETOMIDINE HCL IN NACL 80 MCG/20ML IV SOLN
INTRAVENOUS | Status: DC | PRN
  Administered 2023-09-18 (×3): 8 ug via INTRAVENOUS

## 2023-09-18 MED ORDER — OXYCODONE HCL 5 MG PO TABS: 5.0000 mg | ORAL_TABLET | ORAL | Status: DC | PRN

## 2023-09-18 MED ORDER — PROPOFOL 10 MG/ML IV BOLUS
INTRAVENOUS | Status: AC
  Filled 2023-09-18: qty 20

## 2023-09-18 MED ORDER — OXYCODONE HCL 5 MG PO TABS
5.0000 mg | ORAL_TABLET | Freq: Once | ORAL | Status: AC | PRN
  Administered 2023-09-18: 5 mg via ORAL

## 2023-09-18 MED ORDER — MIDAZOLAM HCL 2 MG/2ML IJ SOLN
INTRAMUSCULAR | Status: AC
Start: 2023-09-18 — End: 2023-09-18
  Filled 2023-09-18: qty 2

## 2023-09-18 MED ORDER — FENTANYL CITRATE (PF) 100 MCG/2ML IJ SOLN
INTRAMUSCULAR | Status: AC
Start: 2023-09-18 — End: 2023-09-18
  Filled 2023-09-18: qty 2

## 2023-09-18 MED ORDER — ENOXAPARIN SODIUM 40 MG/0.4ML IJ SOSY
40.0000 mg | PREFILLED_SYRINGE | INTRAMUSCULAR | Status: DC
  Administered 2023-09-19 – 2023-09-21 (×3): 40 mg via SUBCUTANEOUS
  Filled 2023-09-18 (×3): qty 0.4

## 2023-09-18 MED ORDER — CHLORHEXIDINE GLUCONATE 0.12 % MT SOLN
OROMUCOSAL | Status: AC
  Filled 2023-09-18: qty 15

## 2023-09-18 MED ORDER — MENTHOL 3 MG MT LOZG: 1.0000 | LOZENGE | OROMUCOSAL | Status: DC | PRN

## 2023-09-18 MED ORDER — LIDOCAINE HCL (CARDIAC) PF 100 MG/5ML IV SOSY
PREFILLED_SYRINGE | INTRAVENOUS | Status: DC | PRN
  Administered 2023-09-18: 100 mg via INTRAVENOUS

## 2023-09-18 MED ORDER — LISINOPRIL 10 MG PO TABS
10.0000 mg | ORAL_TABLET | Freq: Every day | ORAL | Status: DC
  Administered 2023-09-19 – 2023-09-21 (×3): 10 mg via ORAL
  Filled 2023-09-18 (×3): qty 1

## 2023-09-18 MED ORDER — ONDANSETRON HCL 4 MG PO TABS: 4.0000 mg | ORAL_TABLET | Freq: Four times a day (QID) | ORAL | Status: DC | PRN

## 2023-09-18 MED ORDER — OXYCODONE HCL 5 MG/5ML PO SOLN: 5.0000 mg | Freq: Once | ORAL | Status: AC | PRN

## 2023-09-18 MED ORDER — IRRISEPT - 450ML BOTTLE WITH 0.05% CHG IN STERILE WATER, USP 99.95% OPTIME
TOPICAL | Status: DC | PRN
  Administered 2023-09-18: 450 mL via TOPICAL

## 2023-09-18 MED ORDER — BUPIVACAINE LIPOSOME 1.3 % IJ SUSP
INTRAMUSCULAR | Status: AC
  Filled 2023-09-18: qty 20

## 2023-09-18 MED ORDER — DEXAMETHASONE SODIUM PHOSPHATE 10 MG/ML IJ SOLN
INTRAMUSCULAR | Status: DC | PRN
Start: 2023-09-18 — End: 2023-09-18
  Administered 2023-09-18: 10 mg via INTRAVENOUS

## 2023-09-18 MED ORDER — SODIUM CHLORIDE 0.9% FLUSH
3.0000 mL | Freq: Two times a day (BID) | INTRAVENOUS | Status: DC
  Administered 2023-09-19 – 2023-09-20 (×4): 3 mL via INTRAVENOUS

## 2023-09-18 MED ORDER — CEFAZOLIN SODIUM-DEXTROSE 2-4 GM/100ML-% IV SOLN
INTRAVENOUS | Status: AC
  Filled 2023-09-18: qty 100

## 2023-09-18 MED ORDER — GLYCOPYRROLATE 0.2 MG/ML IJ SOLN
INTRAMUSCULAR | Status: DC | PRN
  Administered 2023-09-18: .2 mg via INTRAVENOUS

## 2023-09-18 MED ORDER — IPRATROPIUM-ALBUTEROL 0.5-2.5 (3) MG/3ML IN SOLN
3.0000 mL | Freq: Two times a day (BID) | RESPIRATORY_TRACT | Status: DC
  Filled 2023-09-18: qty 9

## 2023-09-18 MED ORDER — GABAPENTIN 300 MG PO CAPS
600.0000 mg | ORAL_CAPSULE | Freq: Three times a day (TID) | ORAL | Status: DC
  Administered 2023-09-19 – 2023-09-21 (×8): 600 mg via ORAL
  Filled 2023-09-18 (×8): qty 2

## 2023-09-18 MED ORDER — PROPOFOL 10 MG/ML IV BOLUS
INTRAVENOUS | Status: DC | PRN
Start: 2023-09-18 — End: 2023-09-18
  Administered 2023-09-18: 200 mg via INTRAVENOUS
  Administered 2023-09-18: 100 mg via INTRAVENOUS
  Administered 2023-09-18: 150 ug/kg/min via INTRAVENOUS

## 2023-09-18 MED ORDER — HYDROMORPHONE HCL 1 MG/ML IJ SOLN
INTRAMUSCULAR | Status: AC
  Filled 2023-09-18: qty 1

## 2023-09-18 MED ORDER — SODIUM CHLORIDE (PF) 0.9 % IJ SOLN
INTRAMUSCULAR | Status: AC
Start: 2023-09-18 — End: 2023-09-18
  Filled 2023-09-18: qty 20

## 2023-09-18 MED ORDER — HYDROMORPHONE HCL 1 MG/ML IJ SOLN
0.5000 mg | INTRAMUSCULAR | Status: AC | PRN
  Administered 2023-09-18 – 2023-09-19 (×2): 0.5 mg via INTRAVENOUS
  Filled 2023-09-18 (×2): qty 0.5

## 2023-09-18 MED ORDER — MIDAZOLAM HCL 2 MG/2ML IJ SOLN
INTRAMUSCULAR | Status: DC | PRN
  Administered 2023-09-18: 2 mg via INTRAVENOUS

## 2023-09-18 MED ORDER — IPRATROPIUM-ALBUTEROL 0.5-2.5 (3) MG/3ML IN SOLN
3.0000 mL | Freq: Two times a day (BID) | RESPIRATORY_TRACT | Status: DC
  Administered 2023-09-19 – 2023-09-21 (×5): 3 mL via RESPIRATORY_TRACT
  Filled 2023-09-18 (×5): qty 3

## 2023-09-18 MED ORDER — SODIUM CHLORIDE 0.9 % IV SOLN
250.0000 mL | INTRAVENOUS | Status: AC
  Administered 2023-09-19: 250 mL via INTRAVENOUS

## 2023-09-18 MED ORDER — SUCCINYLCHOLINE CHLORIDE 200 MG/10ML IV SOSY
PREFILLED_SYRINGE | INTRAVENOUS | Status: AC
  Filled 2023-09-18: qty 10

## 2023-09-18 MED ORDER — PHENYLEPHRINE HCL-NACL 20-0.9 MG/250ML-% IV SOLN
INTRAVENOUS | Status: DC | PRN
  Administered 2023-09-18: 40 ug/min via INTRAVENOUS

## 2023-09-18 MED ORDER — SODIUM CHLORIDE 0.9% FLUSH: 3.0000 mL | INTRAVENOUS | Status: DC | PRN

## 2023-09-18 MED ORDER — HYDROMORPHONE HCL 1 MG/ML IJ SOLN
INTRAMUSCULAR | Status: DC | PRN
  Administered 2023-09-18 (×2): .5 mg via INTRAVENOUS

## 2023-09-18 MED ORDER — ONDANSETRON HCL 4 MG/2ML IJ SOLN
4.0000 mg | Freq: Four times a day (QID) | INTRAMUSCULAR | Status: DC | PRN
  Administered 2023-09-19: 4 mg via INTRAVENOUS
  Filled 2023-09-18: qty 2

## 2023-09-18 MED ORDER — FENTANYL CITRATE (PF) 100 MCG/2ML IJ SOLN
25.0000 ug | INTRAMUSCULAR | Status: DC | PRN
  Administered 2023-09-18 (×2): 50 ug via INTRAVENOUS

## 2023-09-18 MED ORDER — OXYCODONE HCL 5 MG PO TABS
ORAL_TABLET | ORAL | Status: AC
  Filled 2023-09-18: qty 1

## 2023-09-18 MED ORDER — TIZANIDINE HCL 4 MG PO TABS
4.0000 mg | ORAL_TABLET | Freq: Three times a day (TID) | ORAL | Status: DC
  Administered 2023-09-19 – 2023-09-21 (×8): 4 mg via ORAL
  Filled 2023-09-18 (×8): qty 1

## 2023-09-18 MED ORDER — MAGNESIUM CITRATE PO SOLN: 1.0000 | Freq: Once | ORAL | Status: DC | PRN

## 2023-09-18 MED ORDER — ALBUTEROL SULFATE (2.5 MG/3ML) 0.083% IN NEBU
2.5000 mg | INHALATION_SOLUTION | Freq: Four times a day (QID) | RESPIRATORY_TRACT | Status: DC | PRN
Start: 2023-09-18 — End: 2023-09-21
  Administered 2023-09-19: 2.5 mg via RESPIRATORY_TRACT
  Filled 2023-09-18: qty 3

## 2023-09-18 MED ORDER — FENTANYL CITRATE (PF) 100 MCG/2ML IJ SOLN
INTRAMUSCULAR | Status: DC | PRN
  Administered 2023-09-18 (×2): 100 ug via INTRAVENOUS

## 2023-09-18 MED ORDER — ATORVASTATIN CALCIUM 20 MG PO TABS
40.0000 mg | ORAL_TABLET | Freq: Every day | ORAL | Status: DC
  Administered 2023-09-19 – 2023-09-21 (×3): 40 mg via ORAL
  Filled 2023-09-18 (×3): qty 2

## 2023-09-18 MED ORDER — BUPIVACAINE-EPINEPHRINE (PF) 0.5% -1:200000 IJ SOLN
INTRAMUSCULAR | Status: AC
  Filled 2023-09-18: qty 30

## 2023-09-18 MED ORDER — SUCCINYLCHOLINE CHLORIDE 200 MG/10ML IV SOSY
PREFILLED_SYRINGE | INTRAVENOUS | Status: DC | PRN
Start: 2023-09-18 — End: 2023-09-18
  Administered 2023-09-18: 100 mg via INTRAVENOUS

## 2023-09-18 MED ORDER — DEXAMETHASONE SODIUM PHOSPHATE 10 MG/ML IJ SOLN
INTRAMUSCULAR | Status: AC
Start: 2023-09-18 — End: 2023-09-18
  Filled 2023-09-18: qty 1

## 2023-09-18 MED ORDER — POLYETHYLENE GLYCOL 3350 17 G PO PACK: 17.0000 g | PACK | Freq: Every day | ORAL | Status: DC | PRN

## 2023-09-18 MED ORDER — VANCOMYCIN HCL 1000 MG IV SOLR
INTRAVENOUS | Status: DC | PRN
  Administered 2023-09-18: 1000 mg

## 2023-09-18 SURGICAL SUPPLY — 60 items
ALLOGRAFT BONESTRP KORE 2.5X10 (Bone Implant) IMPLANT
BASIN KIT SINGLE STR (MISCELLANEOUS) ×1 IMPLANT
BIT DRILL NS LF DISP RELINE C (BIT) IMPLANT
BUR NEURO DRILL SOFT 3.0X3.8M (BURR) ×1 IMPLANT
COVERAGE SUPP BRAINLAB NG SPNE (MISCELLANEOUS) ×1 IMPLANT
DERMABOND ADVANCED .7 DNX12 (GAUZE/BANDAGES/DRESSINGS) ×1 IMPLANT
DRAPE C ARM PK CFD 31 SPINE (DRAPES) ×1 IMPLANT
DRAPE C-ARMOR (DRAPES) IMPLANT
DRAPE LAPAROTOMY 100X77 ABD (DRAPES) ×1 IMPLANT
DRAPE MICROSCOPE SPINE 48X150 (DRAPES) IMPLANT
DRAPE SCAN PATIENT (DRAPES) ×1 IMPLANT
DRAPE TABLE BACK 80X90 (DRAPES) ×1 IMPLANT
DRSG TEGADERM 4X4.75 (GAUZE/BANDAGES/DRESSINGS) IMPLANT
EVACUATOR 1/8 PVC DRAIN (DRAIN) IMPLANT
EX-PIN ORTHOLOCK NAV 4X150 (PIN) IMPLANT
FEE CVG SUPP BRAINLAB NG SPNE (MISCELLANEOUS) ×1 IMPLANT
FEE INTRAOP CADWELL SUPPLY NCS (MISCELLANEOUS) IMPLANT
FEE INTRAOP MONITOR IMPULS NCS (MISCELLANEOUS) IMPLANT
GLOVE BIOGEL PI IND STRL 6.5 (GLOVE) ×1 IMPLANT
GLOVE SURG SYN 6.5 PF PI (GLOVE) ×1 IMPLANT
GLOVE SURG SYN 8.5 PF PI (GLOVE) ×3 IMPLANT
GOWN SRG LRG LVL 4 IMPRV REINF (GOWNS) ×1 IMPLANT
GOWN SRG XL LVL 3 NONREINFORCE (GOWNS) ×1 IMPLANT
HOLDER FOLEY CATH W/STRAP (MISCELLANEOUS) IMPLANT
KIT INFUSE MEDIUM (Orthopedic Implant) IMPLANT
KIT PREVENA INCISION MGT 13 (CANNISTER) IMPLANT
KIT SPINAL PRONEVIEW (KITS) ×1 IMPLANT
LAVAGE JET IRRISEPT WOUND (IRRIGATION / IRRIGATOR) ×1 IMPLANT
MANIFOLD NEPTUNE II (INSTRUMENTS) ×1 IMPLANT
MARKER SKIN DUAL TIP RULER LAB (MISCELLANEOUS) ×1 IMPLANT
MARKER SPHERE PSV REFLC 13MM (MARKER) ×7 IMPLANT
NDL SAFETY ECLIPSE 18X1.5 (NEEDLE) IMPLANT
NS IRRIG 1000ML POUR BTL (IV SOLUTION) ×1 IMPLANT
PACK LAMINECTOMY ARMC (PACKS) ×1 IMPLANT
PAD ARMBOARD POSITIONER FOAM (MISCELLANEOUS) ×1 IMPLANT
ROD PB RELINE C 3.5X80 (Rod) IMPLANT
ROD PB RELINE C 3.5X90 (Rod) IMPLANT
SCREW LOCK RELINE C OPEN (Screw) IMPLANT
SCREW MA RELINE C 3.5X14 (Screw) ×4 IMPLANT
SCREW MA RELINE C 4X14 (Screw) ×2 IMPLANT
SCREW REL-C 5X30 (Screw) ×2 IMPLANT
SCREW SP MA RELINE-C 3.5X14 (Screw) IMPLANT
SCREW SP MA RELINE-C 4.5X22 (Screw) IMPLANT
SCREW SP MA RELINE-C 4X14 (Screw) IMPLANT
SCREW SP MA TH RELINE-C 5X30 (Screw) IMPLANT
SCREW SPNL REL-C 4.5 22 MON (Screw) ×2 IMPLANT
SPONGE DRAIN TRACH 4X4 STRL 2S (GAUZE/BANDAGES/DRESSINGS) IMPLANT
STAPLER SKIN PROX 35W (STAPLE) ×1 IMPLANT
SURGIFLO W/THROMBIN 8M KIT (HEMOSTASIS) ×1 IMPLANT
SUT STRATA 3-0 15 PS-2 (SUTURE) ×1 IMPLANT
SUT VIC AB 0 CT1 27XCR 8 STRN (SUTURE) ×2 IMPLANT
SUT VIC AB 2-0 CT1 18 (SUTURE) ×2 IMPLANT
SUTURE EHLN 3-0 FS-10 30 BLK (SUTURE) IMPLANT
SYR 20ML LL LF (SYRINGE) IMPLANT
SYR 30ML LL (SYRINGE) ×2 IMPLANT
TAPE CLOTH 3X10 WHT NS LF (GAUZE/BANDAGES/DRESSINGS) ×2 IMPLANT
TRAP FLUID SMOKE EVACUATOR (MISCELLANEOUS) ×1 IMPLANT
TRAY FOLEY SLVR 16FR LF STAT (SET/KITS/TRAYS/PACK) IMPLANT
ULTRASOUND BK UROLOGY (MISCELLANEOUS) ×1 IMPLANT
ULTRASOUND BK UROLOGY PROCEDUR (MISCELLANEOUS) IMPLANT

## 2023-09-18 NOTE — Transfer of Care (Signed)
 Immediate Anesthesia Transfer of Care Note  Patient: Scott Howell  Procedure(s) Performed: POSTERIOR CERVICAL FUSION/FORAMINOTOMY LEVEL 5 (Spine Cervical) APPLICATION OF INTRAOPERATIVE CT SCAN (Spine Cervical)  Patient Location: PACU  Anesthesia Type:General  Level of Consciousness: sedated  Airway & Oxygen Therapy: Patient Spontanous Breathing and Patient connected to face mask oxygen  Post-op Assessment: Report given to RN and Post -op Vital signs reviewed and stable  Post vital signs: Reviewed  Last Vitals:  Vitals Value Taken Time  BP 130/94   Temp    Pulse 89 09/18/23 19:28  Resp 18 09/18/23 19:28  SpO2 100 % 09/18/23 19:28  Vitals shown include unfiled device data.  Last Pain:         Complications: No notable events documented.

## 2023-09-18 NOTE — Op Note (Signed)
 Indications: [name] is a 53 y.o. male with  G95.9 Cervical myelopathy, M48.02 Spinal stenosis in cervical region, M96.0 Pseudarthrosis after fusion or arthrodesis   Findings: pseudoarthrosis  Preoperative Diagnosis:  G95.9 Cervical myelopathy, M48.02 Spinal stenosis in cervical region, M96.0 Pseudarthrosis after fusion or arthrodesis  Postoperative Diagnosis: same   EBL: 300 ml IVF: see anesthesia record Drains: one Disposition: Extubated and Stable to PACU Complications: none  A foley catheter was placed.   Preoperative Note:   Risks of surgery discussed include: infection, bleeding, stroke, coma, death, paralysis, CSF leak, nerve/spinal cord injury, numbness, tingling, weakness, complex regional pain syndrome, recurrent stenosis and/or disc herniation, vascular injury, development of instability, neck/back pain, need for further surgery, persistent symptoms, development of deformity, and the risks of anesthesia. The patient understood these risks and agreed to proceed.  Operative Note:   OPERATIVE PROCEDURE:  1. Posterior Segmental Instrumentation C3-T1 using Nuvasive Reline C 2. Posterolateral arthrodesis from C4-5 and C7-T1 3. Cervical decompression at C3/4, C4/5, and C7/T1 4. Harvesting of autograft via the same incision 5. Use of stereotaxis  OPERATIVE PROCEDURE:  After induction of general anesthesia, the patient was placed in the prone position on the operative table.  A midline incision was then planned.  A timeout was performed, and antibiotics given.  Next, the posterior cervical region was prepped and draped in the usual sterile fashion. The incision was injected with local anesthetic, then opened sharply. A subperiosteal dissection was then carried out to expose the posterior elements from C3 and T1, with careful attention paid to maintaining the C2/3 facet capsule.  After satisfactory exposure had been obtained, we fully uncovered the previous instrumentation  and removed it.  The stereotactic array was then placed.  The stereotactic scanner was used to obtain imaging which was registered to the patient.  Using the image guided drill, the T1 pedicles were cannulated bilaterally.  5 oh by 30 mm screws were placed.  4.5 x 22 mm screws were placed at C7.  The C5 lateral mass screws were upsized.  Using the modified Magerl technique, C3 and C4 lateral mass screws were placed.  Prior to placing the rods, the high-speed drill was used to perform bilateral lamina foraminotomies at C7-T1.  The superior articulating process of T1 was removed bilaterally.  The bilateral C8 nerve roots were fully decompressed.  Rods were then shaped and placed.  The skin was brought back into confirm placement of all the implants.  The locking caps were locked according to manufacturer's specifications.  The high-speed drill was then used to drill trough laminectomies from the inferior half of C3 to the bottom of C5.  The bone was then removed and utilized for autograft later in the case.  The 2 and 3 mm punches were used to remove the ligamentum flavum and perform medial facetectomy and foraminotomies at C3-4 and C4-5.  Monitoring was checked and was stable.  At this point, the posterior elements were copiously irrigated and then decorticated.  A combination of bone morphogenetic protein, allograft, and autograft was placed to aid in fusion at C3-4, C4-5, and C7-T1.  A Hemovac drain was then placed in the wound deep to the fascia.   The wound was closed in a multilayer fashion using interrupted 0 and 2-0 Vicryl sutures.  The final skin edges were reapproximated using staples.  A wound vac was placed.  After closure, the patient was flipped supine. Patient was then handed back over to anesthesia.  All counts were correct  at the conclusion of the procedure.  Neurological monitoring was used throughout, and there were no changes.  Edsel Goods PA acted as an Designer, television/film set  throughout the case. An assistant was required for this procedure due to the complexity.  The assistant provided assistance in tissue manipulation and suction, and was required for the successful and safe performance of the procedure. I performed the critical portions of the procedure.   Reeves Daisy MD

## 2023-09-18 NOTE — Anesthesia Procedure Notes (Signed)
 Procedure Name: Intubation Date/Time: 09/18/2023 3:12 PM  Performed by: Jackye Spanner, CRNAPre-anesthesia Checklist: Patient identified, Emergency Drugs available, Suction available and Patient being monitored Patient Re-evaluated:Patient Re-evaluated prior to induction Oxygen Delivery Method: Circle system utilized Preoxygenation: Pre-oxygenation with 100% oxygen Induction Type: IV induction and Rapid sequence Laryngoscope Size: McGrath and 4 Grade View: Grade I Tube type: Oral Tube size: 7.5 mm Number of attempts: 1 Airway Equipment and Method: Stylet, Oral airway, Bite block and Video-laryngoscopy Placement Confirmation: ETT inserted through vocal cords under direct vision, positive ETCO2 and breath sounds checked- equal and bilateral Secured at: 22 cm Tube secured with: Tape Dental Injury: Teeth and Oropharynx as per pre-operative assessment
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
## 2023-09-18 NOTE — Anesthesia Preprocedure Evaluation (Signed)
 Anesthesia Evaluation  Patient identified by MRN, date of birth, ID band Patient awake    Reviewed: Allergy & Precautions, H&P , NPO status , Patient's Chart, lab work & pertinent test results  Airway Mallampati: IV  TM Distance: >3 FB Neck ROM: full    Dental  (+) Edentulous Upper, Edentulous Lower, Dental Advidsory Given   Pulmonary shortness of breath, asthma , COPD, Current Smoker   Pulmonary exam normal        Cardiovascular Exercise Tolerance: Good hypertension, Normal cardiovascular exam     Neuro/Psych Seizures - (Last february 2024), Well Controlled,  PSYCHIATRIC DISORDERS   Bipolar Disorder Schizophrenia  S/p  C3-5 ANTERIOR CERVICAL               DECOMPRESSION/DISCECTOMY   Neuromuscular disease (Cervical post-laminectomy syndrome, partial use of right arm) CVA    GI/Hepatic Neg liver ROS,GERD  Medicated and Controlled,,  Endo/Other  negative endocrine ROS    Renal/GU      Musculoskeletal   Abdominal   Peds  Hematology negative hematology ROS (+)   Anesthesia Other Findings Patient stated to the pre op nurse he had tea at 2pm for a surgery start time of 3pm. When asked again he stated it was 1230. Denies having anything else other than iced tea.  Past Medical History: No date: Adenomatous polyp of colon No date: Arthritis No date: Asthma No date: Benign fibroma of prostate No date: Bipolar 1 disorder (HCC) No date: Cervical myelopathy (HCC) No date: Cervical post-laminectomy syndrome No date: Cervical spinal cord compression (HCC) No date: Chronic knee pain No date: Chronic pain associated with significant psychosocial  dysfunction No date: Chronic prescription benzodiazepine use No date: Constipation No date: COPD (chronic obstructive pulmonary disease) (HCC) No date: Decreased motor strength No date: Drug abuse, opioid type (HCC) No date: Dyspnea No date: Elevated liver enzymes No date: Erectile  dysfunction No date: Fibromyalgia No date: GERD (gastroesophageal reflux disease) No date: H/O ETOH abuse No date: Headache No date: High cholesterol 11/01/2021: History of 2019 novel coronavirus disease (COVID-19) No date: History of transient cerebral ischemia No date: Homicidal ideation No date: Hypertension No date: Hypokalemia No date: Living accommodation issues     Comment:  a.) as of 03/29/2021 --> living in camper No date: Lower back pain No date: Lumbar canal stenosis No date: Marijuana use No date: 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 No date: S/P TKR (total knee replacement) No date: Schizophrenia (HCC) 11/2020: Seizures (HCC) No date: Status post hardware removal No date: Stroke [facility])     Comment:  no deficits No date: Tobacco use  Past Surgical History: 01/13/2021: ANTERIOR CERVICAL DECOMP/DISCECTOMY FUSION; N/A     Comment:  Procedure: C3-5 ANTERIOR CERVICAL               DECOMPRESSION/DISCECTOMY FUSION;  Surgeon: Clois Fret, MD;  Location: [facility] ORS;  Service: Neurosurgery;              Laterality: N/A; No date: CERVICAL SPINE SURGERY     Comment:  4 No date: CLAVICLE SURGERY 06/29/2022: COLONOSCOPY WITH PROPOFOL ; N/A     Comment:  Procedure: COLONOSCOPY WITH PROPOFOL ;  Surgeon: Therisa Bi, MD;  Location: [facility] ENDOSCOPY;  Service:  Gastroenterology;  Laterality: N/A;  USES MED TRANS; WILL              NEED TIME ON FRIDAY, 06/24/2022 04/20/2015: HARDWARE REMOVAL; Left     Comment:  Procedure: HARDWARE REMOVAL left leg;  Surgeon: Kayla Pinal, MD;  Location: [facility] ORS;  Service: Orthopedics;                Laterality: Left; No date: KNEE SURGERY; Left No date: NECK SURGERY     Comment:  x6 No date: SHOULDER SURGERY; Left 04/12/2021: TOTAL KNEE ARTHROPLASTY; Left     Comment:  Procedure: TOTAL KNEE ARTHROPLASTY;  Surgeon: Leora Lynwood SAUNDERS, MD;  Location: [facility] ORS;   Service: Orthopedics;               Laterality: Left; 11/17/2021: ULNAR NERVE TRANSPOSITION; Right     Comment:  Procedure: SUBCUTANEOUS TRANSPOSITION OF ULNAR NERVE,               RIGHT ELBOW;  Surgeon: Edie Norleen PARAS, MD;  Location: [facility]              ORS;  Service: Orthopedics;  Laterality: Right;     Reproductive/Obstetrics negative OB ROS                              Anesthesia Physical Anesthesia Plan  ASA: 3  Anesthesia Plan: General ETT   Post-op Pain Management:    Induction: Intravenous and Rapid sequence  PONV Risk Score and Plan: 2 and Ondansetron , Dexamethasone  and Midazolam   Airway Management Planned: Oral ETT  Additional Equipment:   Intra-op Plan:   Post-operative Plan: Extubation in OR  Informed Consent: I have reviewed the patients History and Physical, chart, labs and discussed the procedure including the risks, benefits and alternatives for the proposed anesthesia with the patient or authorized representative who has indicated his/her understanding and acceptance.     Dental Advisory Given  Plan Discussed with: Anesthesiologist, CRNA and Surgeon  Anesthesia Plan Comments: (Patient consented for risks of anesthesia including but not limited to:  - adverse reactions to medications - damage to eyes, teeth, lips or other oral mucosa - nerve damage due to positioning  - sore throat or hoarseness - Damage to heart, brain, nerves, lungs, other parts of body or loss of life  Patient voiced understanding and assent.)        Anesthesia Quick Evaluation

## 2023-09-19 ENCOUNTER — Encounter: Payer: Self-pay | Admitting: Neurosurgery

## 2023-09-19 MED ORDER — ZOLPIDEM TARTRATE 5 MG PO TABS
10.0000 mg | ORAL_TABLET | Freq: Every evening | ORAL | Status: DC | PRN
  Administered 2023-09-19 – 2023-09-20 (×2): 10 mg via ORAL
  Filled 2023-09-19 (×2): qty 2

## 2023-09-19 MED ORDER — DIPHENHYDRAMINE HCL 25 MG PO CAPS
25.0000 mg | ORAL_CAPSULE | Freq: Four times a day (QID) | ORAL | Status: DC | PRN
  Administered 2023-09-19: 25 mg via ORAL
  Filled 2023-09-19: qty 1

## 2023-09-19 NOTE — Anesthesia Postprocedure Evaluation (Signed)
 Anesthesia Post Note  Patient: Scott Howell  Procedure(s) Performed: POSTERIOR CERVICAL FUSION/FORAMINOTOMY LEVEL 5 (Spine Cervical) APPLICATION OF INTRAOPERATIVE CT SCAN (Spine Cervical)  Patient location during evaluation: PACU Anesthesia Type: General Level of consciousness: awake and alert Pain management: pain level controlled Vital Signs Assessment: post-procedure vital signs reviewed and stable Respiratory status: spontaneous breathing, nonlabored ventilation, respiratory function stable and patient connected to nasal cannula oxygen Cardiovascular status: blood pressure returned to baseline and stable Postop Assessment: no apparent nausea or vomiting Anesthetic complications: no   No notable events documented.   Last Vitals:  Vitals:   09/18/23 2049 09/19/23 0231  BP: 134/80 122/80  Pulse: 78 77  Resp: 18 18  Temp: 36.6 C 36.9 C  SpO2: 98% 98%    Last Pain:  Vitals:   09/19/23 0302  TempSrc:   PainSc: 7                  [name]

## 2023-09-19 NOTE — Progress Notes (Signed)
 1335 Pt states that pain is still 8/10. And wants something tonight for sleep. Informed Dr Clois. Ambien  orders placed. No change to pain meds.

## 2023-09-19 NOTE — Evaluation (Signed)
 Physical Therapy Evaluation Patient Details Name: Scott Howell MRN: [id number] DOB: Sep 11, 1970 Today's Date: 09/19/2023  History of Present Illness  Pt is a 53 yo male s/p posterior fusion and decompression of C4-T1. PMH of SOB, asthma, COPD, seizures, HTN, GERD, bipolar, schizophrenia, CVA, fibromyalgia, previous cervical surgeries, etoh use, pacemaker  Clinical Impression  Pt A&Ox4, reported 8/10 neck pain and RN in room to administer pain medication upon PT arrival, pt agreeable to mobility (eager for mobility). Per pt at baseline he is modI-I SPC use PRN, lives in a camper alone but family lives ~163ft away.   He was able to perform bed mobility with supervision prior to BLT education. Good sitting balance noted. Sit <> stand with SPC and CGA, did endorse some lightheadedness that improved over time. He ambulated ~125ft with SPC and CGA, 1 LOB noted due to tripping over his cane. Wide BOS with step through pattern noted. Pt returned to room and educated on BLTs, able to recall 3/3.  Overall the patient demonstrated deficits (see PT Problem List) that impede the patient's functional abilities, safety, and mobility and would benefit from skilled PT intervention.          If plan is discharge home, recommend the following: A little help with walking and/or transfers;A little help with bathing/dressing/bathroom;Assistance with cooking/housework;Assist for transportation;Help with stairs or ramp for entrance   Can travel by private vehicle        Equipment Recommendations None recommended by PT  Recommendations for Other Services       Functional Status Assessment Patient has had a recent decline in their functional status and demonstrates the ability to make significant improvements in function in a reasonable and predictable amount of time.     Precautions / Restrictions Precautions Precautions: Cervical;Fall Precaution Booklet Issued: No Recall of Precautions/Restrictions:  Intact Precaution/Restrictions Comments: Impulsive, able to recall 3/3 after education Restrictions Weight Bearing Restrictions Per Provider Order: No      Mobility  Bed Mobility Overal bed mobility: Needs Assistance Bed Mobility: Supine to Sit     Supine to sit: Supervision          Transfers Overall transfer level: Needs assistance Equipment used: Straight cane Transfers: Sit to/from Stand Sit to Stand: Supervision                Ambulation/Gait Ambulation/Gait assistance: Contact guard assist Gait Distance (Feet): 180 Feet Assistive device: Straight cane Gait Pattern/deviations: Step-through pattern, Wide base of support       General Gait Details: Pt had 1 LOB due to tripping on cane  Stairs            Wheelchair Mobility     Tilt Bed    Modified Rankin (Stroke Patients Only)       Balance Overall balance assessment: Needs assistance Sitting-balance support: Feet supported, Bilateral upper extremity supported Sitting balance-Leahy Scale: Good     Standing balance support: Single extremity supported Standing balance-Leahy Scale: Good                               Pertinent Vitals/Pain Pain Assessment Pain Assessment: 0-10 Pain Score: 8  Pain Location: neck Pain Descriptors / Indicators: Sore, Grimacing Pain Intervention(s): RN gave pain meds during session, Monitored during session, Repositioned    Home Living Family/patient expects to be discharged to:: Private residence Living Arrangements: Alone;Other (Comment) (Brother and dad live 157ft away on property) Available Help at Discharge:  Family Type of Home: Other(Comment) Counselling psychologist) Home Access: Stairs to enter Entrance Stairs-Rails: None Entrance Stairs-Number of Steps: 2   Home Layout: One level Home Equipment: Cane - single point      Prior Function Prior Level of Function : Independent/Modified Independent             Mobility Comments: SPC PRN per pt,  uses transportation services for appointments       Extremity/Trunk Assessment        Lower Extremity Assessment Lower Extremity Assessment: RLE deficits/detail;LLE deficits/detail RLE Deficits / Details: grossly 4+/5- hip flexion/dorsiflexion weaker than LLE RLE Sensation: WNL RLE Coordination: WNL LLE Deficits / Details: grossly 5/5 LLE Sensation: WNL LLE Coordination: WNL    Cervical / Trunk Assessment Cervical / Trunk Assessment: Neck Surgery  Communication   Communication Communication: No apparent difficulties    Cognition Arousal: Alert Behavior During Therapy: WFL for tasks assessed/performed, Impulsive   PT - Cognitive impairments: No apparent impairments                         Following commands: Intact       Cueing Cueing Techniques: Verbal cues     General Comments      Exercises Other Exercises Other Exercises: Pt able to ambulate with supervision and stand to urinate   Assessment/Plan    PT Assessment Patient needs continued PT services  PT Problem List Decreased strength;Decreased range of motion;Decreased mobility;Decreased safety awareness;Decreased knowledge of precautions;Decreased skin integrity;Decreased balance;Pain       PT Treatment Interventions DME instruction;Gait training;Stair training;Functional mobility training;Therapeutic activities;Therapeutic exercise;Manual techniques;Patient/family education;Neuromuscular re-education;Balance training;Modalities    PT Goals (Current goals can be found in the Care Plan section)  Acute Rehab PT Goals Patient Stated Goal: go back to outpatient OT PT Goal Formulation: With patient Time For Goal Achievement: 10/03/23 Potential to Achieve Goals: Good    Frequency 7X/week     Co-evaluation               AM-PAC PT 6 Clicks Mobility  Outcome Measure Help needed turning from your back to your side while in a flat bed without using bedrails?: None Help needed moving  from lying on your back to sitting on the side of a flat bed without using bedrails?: None Help needed moving to and from a bed to a chair (including a wheelchair)?: A Little Help needed standing up from a chair using your arms (e.g., wheelchair or bedside chair)?: A Little Help needed to walk in hospital room?: A Little Help needed climbing 3-5 steps with a railing? : A Little 6 Click Score: 20    End of Session   Activity Tolerance: Patient tolerated treatment well Patient left: in chair;with call bell/phone within reach;with chair alarm set Nurse Communication: Mobility status PT Visit Diagnosis: Other abnormalities of gait and mobility (R26.89);Difficulty in walking, not elsewhere classified (R26.2);Muscle weakness (generalized) (M62.81);Pain Pain - Right/Left:  (midline) Pain - part of body:  (neck)    Time: 9099-9078 PT Time Calculation (min) (ACUTE ONLY): 21 min   Charges:   PT Evaluation $PT Eval Low Complexity: 1 Low PT Treatments $Therapeutic Activity: 8-22 mins PT General Charges $$ ACUTE PT VISIT: 1 Visit        Doyal Shams PT, DPT 11:35 AM,09/19/23

## 2023-09-19 NOTE — Progress Notes (Signed)
   Neurosurgery Progress Note  History: Scott Howell is s/p C3-4, C4-5 and C7-T1. C3-T1 PSF. C4-5 and C7-T1 arthrodesis.  POD1: doing well this morning and eager to discharge.   Physical Exam: Vitals:   09/19/23 0231 09/19/23 0613  BP: 122/80 118/71  Pulse: 77 (!) 102  Resp: 18 18  Temp: 98.5 F (36.9 C) 98 F (36.7 C)  SpO2: 98% 96%    AA Ox3 CNI  Strength: Side Biceps Triceps Deltoid Interossei Grip Wrist Ext. Wrist Flex.  R 5 5 4 3  4- 4- 4-  L 5 5 5 5 5 5 5     Side Iliopsoas Quads Hamstring PF DF EHL  R 5 5 5 5 5 5   L 5 5 5 5 5 5     HV output 162 Incision with wound vac in place  Data:  Other tests/results: see results review  Assessment/Plan:  Scott Howell is a 53 y.o presenting with cervical myelopathy and pseudoarthrosis s/p cervical decompression and fusion  - mobilize - pain control - DVT prophylaxis - will continue HV giving output - PTOT; dispo pending recommendation  Edsel Goods PA-C Department of Neurosurgery

## 2023-09-19 NOTE — Evaluation (Signed)
 Occupational Therapy Evaluation Patient Details Name: Scott Howell MRN: [id number] DOB: 23-Nov-1970 Today's Date: 09/19/2023   History of Present Illness   Pt is a 53 yo male s/p posterior fusion and decompression of C4-T1. PMH of SOB, asthma, COPD, seizures, HTN, GERD, bipolar, schizophrenia, CVA, fibromyalgia, previous cervical surgeries, etoh use, pacemaker     Clinical Impressions Pt seen for OT evaluation this date, POD#1 from above surgery. Prior to hospital admission, pt was mod independent with mobility using SPC PRN, ADL, and used transportation services for appointments. Pt lives with his dog in a pop up camping trailer with step dad and brother nearby. Pt currently requires supv for seated ADL tasks for safety, SBA for ADL transfers with [facility], and SBA-CGA for ADL mobility with [facility] for safety. Intermittent VC for safety/sequencing to ensure pt adheres to cervical precautions. Difficulty maintaining precautions while multi-tasking. Pt instructed in cervical precautions, falls prevention, home/routines modifications, and AE/DME to maximize safety and recovery while pt ambulated in hall ~160' with [facility] and no overt LOB noted. Handout provided to support recall and carryover. Pt verbalized understanding of all education/training provided. Pt will benefit from additional skilled OT services while hospitalized to maximize return to PLOF.     If plan is discharge home, recommend the following:   A little help with bathing/dressing/bathroom;Assistance with cooking/housework;Assist for transportation;Help with stairs or ramp for entrance     Functional Status Assessment   Patient has had a recent decline in their functional status and demonstrates the ability to make significant improvements in function in a reasonable and predictable amount of time.     Equipment Recommendations   Tub/shower seat     Recommendations for Other Services         Precautions/Restrictions    Precautions Precautions: Cervical;Fall Precaution Booklet Issued: Yes (comment) Recall of Precautions/Restrictions: Intact Precaution/Restrictions Comments: Impulsive, able to recall 3/3 after education Restrictions Weight Bearing Restrictions Per Provider Order: No     Mobility Bed Mobility Overal bed mobility: Needs Assistance Bed Mobility: Rolling, Sidelying to Sit, Sit to Sidelying Rolling: Modified independent (Device/Increase time) Sidelying to sit: Modified independent (Device/Increase time)     Sit to sidelying: Modified independent (Device/Increase time) General bed mobility comments: After initial instruction after pt sat straight up without minding precautions, pt able to return demo proper technique for log roll in and out of bed with mod indep    Transfers Overall transfer level: Needs assistance Equipment used: Straight cane Transfers: Sit to/from Stand Sit to Stand: Supervision                  Balance Overall balance assessment: Needs assistance Sitting-balance support: Feet supported, No upper extremity supported Sitting balance-Leahy Scale: Good     Standing balance support: Single extremity supported Standing balance-Leahy Scale: Good                             ADL either performed or assessed with clinical judgement   ADL Overall ADL's : Needs assistance/impaired                         Toilet Transfer: Supervision/safety;Regular Toilet           Functional mobility during ADLs: Contact guard assist;Supervision/safety;Cane General ADL Comments: Supv for seated LB ADL tasks to ensure precautions     Vision         Perception  Praxis         Pertinent Vitals/Pain Pain Assessment Pain Assessment: 0-10 Pain Score: 8  Pain Location: neck Pain Descriptors / Indicators: Sore, Grimacing Pain Intervention(s): Limited activity within patient's tolerance, Monitored during session, Premedicated before  session, Repositioned, Patient requesting pain meds-RN notified     Extremity/Trunk Assessment Upper Extremity Assessment Upper Extremity Assessment: Generalized weakness (R>L hand wasting)   Lower Extremity Assessment Lower Extremity Assessment: Defer to PT evaluation;RLE deficits/detail RLE Deficits / Details: grossly 4+/5- hip flexion/dorsiflexion weaker than LLE RLE Sensation: WNL RLE Coordination: WNL LLE Deficits / Details: grossly 5/5 LLE Sensation: WNL LLE Coordination: WNL   Cervical / Trunk Assessment Cervical / Trunk Assessment: Neck Surgery   Communication Communication Communication: No apparent difficulties   Cognition Arousal: Alert Behavior During Therapy: WFL for tasks assessed/performed, Impulsive Cognition: No apparent impairments                               Following commands: Intact       Cueing  General Comments   Cueing Techniques: Verbal cues  drains in place   Exercises Other Exercises Other Exercises: Pt instructed in cervical precautions, falls prevention, home/routines modifications, and AE/DME to maximize safety and recovery while pt ambulated in hall ~160' with [facility] and no overt LOB noted. HAndout provided.   Shoulder Instructions      Home Living Family/patient expects to be discharged to:: Private residence Living Arrangements: Alone;Other (Comment) (Brother and dad live 135ft away on property) Available Help at Discharge: Family Type of Home: Other(Comment) Counselling psychologist) Home Access: Stairs to enter Secretary/administrator of Steps: 2 Entrance Stairs-Rails: None Home Layout: One level     Bathroom Shower/Tub: Walk-in shower         Home Equipment: [place] - single point   Additional Comments: Reports having access to a shower chair that was his mother's but may not fit in small stall shower he has      Prior Functioning/Environment Prior Level of Function : Independent/Modified Independent              Mobility Comments: SPC PRN per pt, uses transportation services for appointments ADLs Comments: indep    OT Problem List: Decreased strength;Pain;Decreased range of motion;Decreased safety awareness;Decreased activity tolerance;Impaired balance (sitting and/or standing);Impaired UE functional use;Decreased knowledge of precautions   OT Treatment/Interventions: Self-care/ADL training;Therapeutic exercise;Therapeutic activities;Energy conservation;DME and/or AE instruction;Patient/family education      OT Goals(Current goals can be found in the care plan section)   Acute Rehab OT Goals Patient Stated Goal: go home and be indep OT Goal Formulation: With patient Time For Goal Achievement: 10/03/23 Potential to Achieve Goals: Good ADL Goals Pt Will Perform Lower Body Dressing: with modified independence;sit to/from stand (maintaining precautions) Pt Will Transfer to Toilet: with modified independence;ambulating (LRAD, maintaining precautions) Pt Will Perform Toileting - Clothing Manipulation and hygiene: with modified independence;sitting/lateral leans;sit to/from stand Additional ADL Goal #1: Pt will complete all aspects of bathing, primarily from sitting, with mod indep, maintaining cervical precautions, 1/1 opportunity.   OT Frequency:  Min 2X/week    Co-evaluation              AM-PAC OT 6 Clicks Daily Activity     Outcome Measure Help from another person eating meals?: None Help from another person taking care of personal grooming?: None Help from another person toileting, which includes using toliet, bedpan, or urinal?: A Little Help from another person bathing (  including washing, rinsing, drying)?: A Little Help from another person to put on and taking off regular upper body clothing?: A Little Help from another person to put on and taking off regular lower body clothing?: A Little 6 Click Score: 20   End of Session Nurse Communication: Patient requests pain  meds  Activity Tolerance: Patient tolerated treatment well Patient left: in bed;with call bell/phone within reach;with nursing/sitter in room (seated EOB with RN for meds)  OT Visit Diagnosis: Other abnormalities of gait and mobility (R26.89);Muscle weakness (generalized) (M62.81)                Time: 8851-8792 OT Time Calculation (min): 19 min Charges:  OT General Charges $OT Visit: 1 Visit OT Evaluation $OT Eval Low Complexity: 1 Low OT Treatments $Self Care/Home Management : 8-22 mins  Warren SAUNDERS., MPH, MS, OTR/L ascom [phone] 09/19/23, 12:34 PM

## 2023-09-19 NOTE — Plan of Care (Signed)

## 2023-09-19 NOTE — Discharge Instructions (Signed)
 Your surgeon has performed an operation on your neck to relieve pressure on one or more nerves. Many times, patients feel better immediately after surgery and can "overdo it." Even if you feel well, it is important that you follow these activity guidelines. If you do not let your back heal properly from the surgery, you can increase the chance of hardware complications and/or return of your symptoms. The following are instructions to help in your recovery once you have been discharged from the hospital.  Do not use NSAIDs for 3 months after surgery.  *Regarding compression stockings-  Please wear day and night until you are walking a couple hundred feet three times a day.   Activity    No bending, lifting, or twisting ("BLT"). Avoid lifting objects heavier than 10 pounds (gallon milk jug).  Where possible, avoid household activities that involve lifting, bending, pushing, or pulling such as laundry, vacuuming, grocery shopping, and childcare. Try to arrange for help from friends and family for these activities while your back heals.  Increase physical activity slowly as tolerated.  Taking short walks is encouraged, but avoid strenuous exercise. Do not jog, run, bicycle, lift weights, or participate in any other exercises unless specifically allowed by your doctor. Avoid prolonged sitting, including car rides.  Talk to your doctor before resuming sexual activity.  You should not drive until cleared by your doctor.  Until released by your doctor, you should not return to work or school.  You should rest at home and let your body heal.   You may shower three days after your surgery.  After showering, lightly dab your incision dry. Do not take a tub bath or go swimming for 3 weeks, or until approved by your doctor at your follow-up appointment.  If you smoke, we strongly recommend that you quit.  Smoking has been proven to interfere with normal healing in your back and will dramatically reduce the  success rate of your surgery. Please contact [facility] (800-QUIT-NOW) and use the resources at www.[facility].com for assistance in stopping smoking.  Surgical Incision   If you have a dressing on your incision, you may remove it three days after your surgery. Keep your incision area clean and dry.  Your incision was closed with Dermabond glue. The glue should begin to peel away within about a week.  Diet            You may return to your usual diet. Be sure to stay hydrated.  When to Contact Us   Although your surgery and recovery will likely be uneventful, you may have some residual numbness, aches, and pains in your back and/or legs. This is normal and should improve in the next few weeks.  However, should you experience any of the following, contact us  immediately: New numbness or weakness Pain that is progressively getting worse, and is not relieved by your pain medications or rest Bleeding, redness, swelling, pain, or drainage from surgical incision Chills or flu-like symptoms Fever greater than 101.0 F (38.3 C) Problems with bowel or bladder functions Difficulty breathing or shortness of breath Warmth, tenderness, or swelling in your calf  Contact Information How to contact us :  If you have any questions/concerns before or after surgery, you can reach us  at [phone], or you can send a mychart message. We can be reached by phone or mychart 8am-4pm, Monday-Friday.  *Please note: Calls after 4pm are forwarded to a third party answering service. Mychart messages are not routinely monitored during evenings, weekends, and holidays.  Please call our office to contact the answering service for urgent concerns during non-business hours.

## 2023-09-20 ENCOUNTER — Encounter: Payer: MEDICAID | Admitting: Orthopedic Surgery

## 2023-09-20 MED ORDER — NICOTINE POLACRILEX 2 MG MT GUM
2.0000 mg | CHEWING_GUM | OROMUCOSAL | Status: DC | PRN
Start: 2023-09-20 — End: 2023-09-21
  Administered 2023-09-20 – 2023-09-21 (×3): 2 mg via ORAL
  Filled 2023-09-20 (×5): qty 1

## 2023-09-20 NOTE — Progress Notes (Signed)
 Occupational Therapy Treatment Patient Details Name: Scott Howell MRN: [id number] DOB: 02/04/1971 Today's Date: 09/20/2023   History of present illness Pt is a 53 yo male s/p C3-4, C4-5 and C7-T1. C3-T1 PSF. C4-5 and C7-T1 arthrodesis.PMH of SOB, asthma, COPD, seizures, HTN, GERD, bipolar, schizophrenia, CVA, fibromyalgia, previous cervical surgeries, etoh use, pacemaker   OT comments  Pt seen for OT treatment on this date. Upon arrival to room pt seated EOB, agreeable to tx. Pt donned bilateral socks while seated on he EOB with supervision, cues for cervical precautions. Pt completed STS from the EOB with no UE support, pt amb within room and hallway while holding his wound vac in his L hand and his SPC in the other, however pt does not use cane. Pt reports he likes to carry it in case he starts to feel weak. Pt amb two laps around the nurses station with slow but steady gait, no LOB noted. Pt eager to make progress in functional mobility and improve activity tolerance on this date. Pt retired sitting on the EOB, explaining how he is in need of a cigarette, and that he consumed his nicotine  patch during the night. Pt educated on safety risks and communicated with RN about the conversation had with the pt. Pt making good progress toward goals, will continue to follow POC. Discharge recommendation remains appropriate.        If plan is discharge home, recommend the following:  A little help with bathing/dressing/bathroom;Assistance with cooking/housework;Assist for transportation;Help with stairs or ramp for entrance   Equipment Recommendations  Tub/shower seat    Recommendations for Other Services      Precautions / Restrictions Precautions Precautions: Cervical;Fall Precaution Booklet Issued: Yes (comment) Recall of Precautions/Restrictions: Intact Precaution/Restrictions Comments: Impulsive, able to recall 3/3 after education Restrictions Weight Bearing Restrictions Per Provider Order:  No       Mobility Bed Mobility               General bed mobility comments: NT sitting on the EOB pre/post session    Transfers Overall transfer level: Modified independent Equipment used: Straight cane Transfers: Sit to/from Stand Sit to Stand: Modified independent (Device/Increase time)           General transfer comment: No physical assistance to come to full standing position, pt keeps SPC in R hand but does not use for mobility     Balance Overall balance assessment: Needs assistance Sitting-balance support: Feet supported, No upper extremity supported Sitting balance-Leahy Scale: Good Sitting balance - Comments: Steady reaching within BOS   Standing balance support: Single extremity supported Standing balance-Leahy Scale: Good                             ADL either performed or assessed with clinical judgement   ADL Overall ADL's : Needs assistance/impaired Eating/Feeding: Set up;Modified independent                   Lower Body Dressing: Supervision/safety;Sitting/lateral leans   Toilet Transfer: Supervision/safety;Ambulation           Functional mobility during ADLs: Supervision/safety;Cueing for safety General ADL Comments: Simulated toilet transfers with SPC in hand but not using for support     Communication Communication Communication: No apparent difficulties   Cognition Arousal: Alert Behavior During Therapy: WFL for tasks assessed/performed, Impulsive Cognition: No apparent impairments             OT - Cognition Comments:  A/Ox4                 Following commands: Intact        Cueing   Cueing Techniques: Verbal cues  Exercises Exercises: Other exercises Other Exercises Other Exercises: Edu: Safety issues reguarding smoking during this healing phase           General Comments Wound vac in place throughout session    Pertinent Vitals/ Pain       Pain Assessment Pain Assessment: 0-10 Pain  Score: 5  Pain Location: neck Pain Descriptors / Indicators: Sore, Grimacing Pain Intervention(s): Limited activity within patient's tolerance, Monitored during session, Repositioned                                                          Frequency  Min 2X/week        Progress Toward Goals  OT Goals(current goals can now be found in the care plan section)  Progress towards OT goals: Progressing toward goals  Acute Rehab OT Goals OT Goal Formulation: With patient Time For Goal Achievement: 10/03/23 Potential to Achieve Goals: Good ADL Goals Pt Will Perform Lower Body Dressing: with modified independence;sit to/from stand Pt Will Transfer to Toilet: with modified independence;ambulating Pt Will Perform Toileting - Clothing Manipulation and hygiene: with modified independence;sitting/lateral leans;sit to/from stand Additional ADL Goal #1: Pt will complete all aspects of bathing, primarily from sitting, with mod indep, maintaining cervical precautions, 1/1 opportunity.   AM-PAC OT 6 Clicks Daily Activity     Outcome Measure   Help from another person eating meals?: None Help from another person taking care of personal grooming?: None Help from another person toileting, which includes using toliet, bedpan, or urinal?: None Help from another person bathing (including washing, rinsing, drying)?: None Help from another person to put on and taking off regular upper body clothing?: None Help from another person to put on and taking off regular lower body clothing?: A Little 6 Click Score: 23    End of Session    OT Visit Diagnosis: Other abnormalities of gait and mobility (R26.89);Muscle weakness (generalized) (M62.81)   Activity Tolerance Patient tolerated treatment well   Patient Left in bed;with call bell/phone within reach   Nurse Communication Mobility status;Other (comment) (Pt eager to leave hospital room to smoke, consuming his nicotine   patch)        Time: 8846-8793 OT Time Calculation (min): 13 min  Charges: OT General Charges $OT Visit: 1 Visit OT Treatments $Therapeutic Activity: 8-22 mins  Larraine Colas M.S. OTR/L  09/20/23, 12:57 PM

## 2023-09-20 NOTE — Progress Notes (Signed)
 Physical Therapy Treatment Patient Details Name: Scott Howell MRN: [id number] DOB: [birth year] Today's Date: 09/20/2023   History of Present Illness Pt is a 53 yo male s/p C3-4, C4-5 and C7-T1. C3-T1 PSF. C4-5 and C7-T1 arthrodesis.PMH of SOB, asthma, COPD, seizures, HTN, GERD, bipolar, schizophrenia, CVA, fibromyalgia, previous cervical surgeries, etoh use, pacemaker    PT Comments  Patient alert, agreeable to PT ambulating into bathroom upon PT arrival. Reported 8/10 cervical pain, RN notified. He was able to ambulate with supervision today, with and without SPC. Stair training performed with CGA-supervision and SPC as well as needed. Able to perform stair navigation in several ways, verbalized/demonstrated safe techniques. Returned to room with needs in reach. The patient would benefit from further skilled PT intervention to continue to progress towards goals.     If plan is discharge home, recommend the following: A little help with bathing/dressing/bathroom;Assistance with cooking/housework;Assist for transportation;Help with stairs or ramp for entrance   Can travel by private vehicle        Equipment Recommendations  None recommended by PT    Recommendations for Other Services       Precautions / Restrictions Precautions Precautions: Cervical;Fall Precaution Booklet Issued: Yes (comment) Recall of Precautions/Restrictions: Intact Precaution/Restrictions Comments: Impulsive, able to recall 3/3 after education Restrictions Weight Bearing Restrictions Per Provider Order: No     Mobility  Bed Mobility               General bed mobility comments: pt coming out of bathroom upon PT entrance    Transfers Overall transfer level: Needs assistance Equipment used: None Transfers: Sit to/from Stand Sit to Stand: Supervision                Ambulation/Gait Ambulation/Gait assistance: Supervision Gait Distance (Feet): 250 Feet Assistive device: Straight cane, None              Stairs Stairs: Yes Stairs assistance: Supervision Stair Management: One rail Right, Two rails, One rail Left, With cane Number of Stairs: 8 General stair comments: able to perform with varying UE support, use of SPC and unilateral rail is most similiar to pt set up at home   Wheelchair Mobility     Tilt Bed    Modified Rankin (Stroke Patients Only)       Balance Overall balance assessment: Needs assistance Sitting-balance support: Feet supported, No upper extremity supported Sitting balance-Leahy Scale: Good     Standing balance support: Single extremity supported Standing balance-Leahy Scale: Good                              Communication Communication Communication: No apparent difficulties  Cognition Arousal: Alert Behavior During Therapy: WFL for tasks assessed/performed, Impulsive   PT - Cognitive impairments: No apparent impairments                         Following commands: Intact      Cueing Cueing Techniques: Verbal cues  Exercises      General Comments        Pertinent Vitals/Pain Pain Assessment Pain Assessment: 0-10 Pain Score: 8  Pain Location: neck Pain Descriptors / Indicators: Sore, Grimacing Pain Intervention(s): Limited activity within patient's tolerance, Monitored during session, Repositioned    Home Living                          Prior Function  PT Goals (current goals can now be found in the care plan section) Progress towards PT goals: Progressing toward goals    Frequency    7X/week      PT Plan      Co-evaluation              AM-PAC PT 6 Clicks Mobility   Outcome Measure  Help needed turning from your back to your side while in a flat bed without using bedrails?: None Help needed moving from lying on your back to sitting on the side of a flat bed without using bedrails?: None Help needed moving to and from a bed to a chair (including a  wheelchair)?: A Little Help needed standing up from a chair using your arms (e.g., wheelchair or bedside chair)?: A Little Help needed to walk in hospital room?: A Little Help needed climbing 3-5 steps with a railing? : A Little 6 Click Score: 20    End of Session Equipment Utilized During Treatment: Gait belt Activity Tolerance: Patient tolerated treatment well Patient left: in chair;with call bell/phone within reach;with chair alarm set Nurse Communication: Mobility status PT Visit Diagnosis: Other abnormalities of gait and mobility (R26.89);Difficulty in walking, not elsewhere classified (R26.2);Muscle weakness (generalized) (M62.81);Pain Pain - Right/Left:  (midline) Pain - part of body:  (neck)     Time: 9154-9145 PT Time Calculation (min) (ACUTE ONLY): 9 min  Charges:    $Therapeutic Activity: 8-22 mins PT General Charges $$ ACUTE PT VISIT: 1 Visit                     Doyal Shams PT, DPT 12:07 PM,09/20/23

## 2023-09-20 NOTE — Plan of Care (Signed)
 Problem: Education: Goal: Knowledge of General Education information will improve Description: Including pain rating scale, medication(s)/side effects and non-pharmacologic comfort measures 09/20/2023 1801 by Jenel Candis HERO, RN Outcome: Progressing 09/20/2023 1543 by Jenel Candis HERO, RN Outcome: Progressing   Problem: Health Behavior/Discharge Planning: Goal: Ability to manage health-related needs will improve 09/20/2023 1801 by Jenel Candis HERO, RN Outcome: Progressing 09/20/2023 1543 by Jenel Candis HERO, RN Outcome: Progressing   Problem: Clinical Measurements: Goal: Ability to maintain clinical measurements within normal limits will improve 09/20/2023 1801 by Jenel Candis HERO, RN Outcome: Progressing 09/20/2023 1543 by Jenel Candis HERO, RN Outcome: Progressing Goal: Will remain free from infection 09/20/2023 1801 by Jenel Candis HERO, RN Outcome: Progressing 09/20/2023 1543 by Jenel Candis HERO, RN Outcome: Progressing Goal: Diagnostic test results will improve 09/20/2023 1801 by Jenel Candis HERO, RN Outcome: Progressing 09/20/2023 1543 by Jenel Candis HERO, RN Outcome: Progressing Goal: Respiratory complications will improve 09/20/2023 1801 by Jenel Candis HERO, RN Outcome: Progressing 09/20/2023 1543 by Jenel Candis HERO, RN Outcome: Progressing Goal: Cardiovascular complication will be avoided 09/20/2023 1801 by Jenel Candis HERO, RN Outcome: Progressing 09/20/2023 1543 by Jenel Candis HERO, RN Outcome: Progressing   Problem: Activity: Goal: Risk for activity intolerance will decrease 09/20/2023 1801 by Jenel Candis HERO, RN Outcome: Progressing 09/20/2023 1543 by Jenel Candis HERO, RN Outcome: Progressing   Problem: Nutrition: Goal: Adequate nutrition will be maintained 09/20/2023 1801 by Jenel Candis HERO, RN Outcome: Progressing 09/20/2023 1543 by Jenel Candis HERO, RN Outcome: Progressing   Problem: Coping: Goal: Level of anxiety will decrease 09/20/2023 1801 by Jenel Candis HERO, RN Outcome: Progressing 09/20/2023 1543 by Jenel Candis HERO, RN Outcome: Progressing   Problem: Elimination: Goal: Will not experience complications related to bowel motility 09/20/2023 1801 by Jenel Candis HERO, RN Outcome: Progressing 09/20/2023 1543 by Jenel Candis HERO, RN Outcome: Progressing Goal: Will not experience complications related to urinary retention 09/20/2023 1801 by Jenel Candis HERO, RN Outcome: Progressing 09/20/2023 1543 by Jenel Candis HERO, RN Outcome: Progressing   Problem: Pain Managment: Goal: General experience of comfort will improve and/or be controlled 09/20/2023 1801 by Jenel Candis HERO, RN Outcome: Progressing 09/20/2023 1543 by Jenel Candis HERO, RN Outcome: Progressing   Problem: Safety: Goal: Ability to remain free from injury will improve 09/20/2023 1801 by Jenel Candis HERO, RN Outcome: Progressing 09/20/2023 1543 by Jenel Candis HERO, RN Outcome: Progressing   Problem: Skin Integrity: Goal: Risk for impaired skin integrity will decrease 09/20/2023 1801 by Jenel Candis HERO, RN Outcome: Progressing 09/20/2023 1543 by Jenel Candis HERO, RN Outcome: Progressing   Problem: Education: Goal: Ability to verbalize activity precautions or restrictions will improve 09/20/2023 1801 by Jenel Candis HERO, RN Outcome: Progressing 09/20/2023 1543 by Jenel Candis HERO, RN Outcome: Progressing Goal: Knowledge of the prescribed therapeutic regimen will improve 09/20/2023 1801 by Jenel Candis HERO, RN Outcome: Progressing 09/20/2023 1543 by Jenel Candis HERO, RN Outcome: Progressing Goal: Understanding of discharge needs will improve 09/20/2023 1801 by Jenel Candis HERO, RN Outcome: Progressing 09/20/2023 1543 by Jenel Candis HERO, RN Outcome: Progressing   Problem: Activity: Goal: Ability to avoid complications of mobility impairment will improve 09/20/2023 1801 by Jenel Candis HERO, RN Outcome: Progressing 09/20/2023 1543 by Jenel Candis HERO, RN Outcome: Progressing Goal: Ability to tolerate increased activity will improve 09/20/2023 1801 by Jenel Candis HERO, RN Outcome:  Progressing 09/20/2023 1543 by Jenel Candis HERO, RN Outcome: Progressing Goal: Will remain free from falls 09/20/2023 1801 by Jenel Candis HERO, RN Outcome: Progressing 09/20/2023 1543 by  Jenel Candis HERO, RN Outcome: Progressing   Problem: Bowel/Gastric: Goal: Gastrointestinal status for postoperative course will improve 09/20/2023 1801 by Jenel Candis HERO, RN Outcome: Progressing 09/20/2023 1543 by Jenel Candis HERO, RN Outcome: Progressing   Problem: Clinical Measurements: Goal: Ability to maintain clinical measurements within normal limits will improve 09/20/2023 1801 by Jenel Candis HERO, RN Outcome: Progressing 09/20/2023 1543 by Jenel Candis HERO, RN Outcome: Progressing Goal: Postoperative complications will be avoided or minimized 09/20/2023 1801 by Jenel Candis HERO, RN Outcome: Progressing 09/20/2023 1543 by Jenel Candis HERO, RN Outcome: Progressing Goal: Diagnostic test results will improve 09/20/2023 1801 by Jenel Candis HERO, RN Outcome: Progressing 09/20/2023 1543 by Jenel Candis HERO, RN Outcome: Progressing   Problem: Pain Management: Goal: Pain level will decrease 09/20/2023 1801 by Jenel Candis HERO, RN Outcome: Progressing 09/20/2023 1543 by Jenel Candis HERO, RN Outcome: Progressing   Problem: Skin Integrity: Goal: Will show signs of wound healing 09/20/2023 1801 by Jenel Candis HERO, RN Outcome: Progressing 09/20/2023 1543 by Jenel Candis HERO, RN Outcome: Progressing   Problem: Health Behavior/Discharge Planning: Goal: Identification of resources available to assist in meeting health care needs will improve 09/20/2023 1801 by Jenel Candis HERO, RN Outcome: Progressing 09/20/2023 1543 by Jenel Candis HERO, RN Outcome: Progressing   Problem: Bladder/Genitourinary: Goal: Urinary functional status for postoperative course will improve 09/20/2023 1801 by Jenel Candis HERO, RN Outcome: Progressing 09/20/2023 1543 by Jenel Candis HERO, RN Outcome: Progressing

## 2023-09-20 NOTE — Plan of Care (Signed)
  Problem: Education: Goal: Knowledge of General Education information will improve Description: Including pain rating scale, medication(s)/side effects and non-pharmacologic comfort measures Outcome: Progressing   Problem: Clinical Measurements: Goal: Diagnostic test results will improve Outcome: Progressing   Problem: Activity: Goal: Risk for activity intolerance will decrease Outcome: Progressing   Problem: Nutrition: Goal: Adequate nutrition will be maintained Outcome: Progressing   Problem: Activity: Goal: Ability to avoid complications of mobility impairment will improve Outcome: Progressing

## 2023-09-20 NOTE — Progress Notes (Signed)
   Neurosurgery Progress Note  History: Scott Howell is s/p C3-4, C4-5 and C7-T1. C3-T1 PSF. C4-5 and C7-T1 arthrodesis.  POD2: NAEO. POD1: doing well this morning and eager to discharge.   Physical Exam: Vitals:   09/20/23 0720 09/20/23 0723  BP: 105/81 105/81  Pulse: 89 85  Resp: 16 16  Temp: 98.7 F (37.1 C) 98.7 F (37.1 C)  SpO2:  97%    AA Ox3 CNI  Strength: Side Biceps Triceps Deltoid Interossei Grip Wrist Ext. Wrist Flex.  R 5 5 4 3  4- 4- 4-  L 5 5 5 5 5 5 5     Side Iliopsoas Quads Hamstring PF DF EHL  R 5 5 5 5 5 5   L 5 5 5 5 5 5     HV output 60 overnight.  Incision with wound vac in place  Data:  Other tests/results: see results review  Assessment/Plan:  Scott Howell is a 53 y.o presenting with cervical myelopathy and pseudoarthrosis s/p cervical decompression and fusion  - mobilize - pain control - DVT prophylaxis - will continue HV given output - PTOT; dispo pending recommendation  Edsel Goods PA-C Department of Neurosurgery

## 2023-09-20 NOTE — Plan of Care (Signed)

## 2023-09-21 ENCOUNTER — Other Ambulatory Visit: Payer: Self-pay

## 2023-09-21 MED ORDER — SENNA 8.6 MG PO TABS
1.0000 | ORAL_TABLET | Freq: Two times a day (BID) | ORAL | 0 refills | Status: DC | PRN
  Filled 2023-09-21: qty 30, 15d supply, fill #0

## 2023-09-21 MED ORDER — OXYCODONE HCL 5 MG PO TABS
5.0000 mg | ORAL_TABLET | ORAL | 0 refills | Status: DC | PRN
  Filled 2023-09-21: qty 45, 4d supply, fill #0

## 2023-09-21 MED ORDER — TIZANIDINE HCL 4 MG PO TABS
4.0000 mg | ORAL_TABLET | Freq: Three times a day (TID) | ORAL | 0 refills | Status: DC
  Filled 2023-09-21: qty 90, 30d supply, fill #0

## 2023-09-21 NOTE — Plan of Care (Signed)

## 2023-09-21 NOTE — Progress Notes (Addendum)
 9141 D/C AVS completed and reviewed with pt. All opportunities for questions answered and clarified. IV removed. Pt will be wheeled down to car at [facility] mall entrance via wheelchair.  D/c barriers: meds to beds, medicaid transportation   [phone] Meds delivered to pt at bedside. Medicaid transportation called at this time

## 2023-09-21 NOTE — Discharge Summary (Signed)
 Discharge Summary  Patient ID: Scott Howell MRN: [id number] DOB/AGE: 1970-03-18 53 y.o.  Admit date: 09/18/2023 Discharge date: 09/21/2023  Admission Diagnoses: G95.9 Cervical myelopathy, M48.02 Spinal stenosis in cervical region, M96.0 Pseudarthrosis after fusion or arthrodesis  Discharge Diagnoses:  Principal Problem:   S/P cervical spinal fusion Active Problems:   Spinal stenosis in cervical region   Pseudarthrosis following spinal fusion   Discharged Condition: good  Hospital Course:  Scott Howell is a 53 y.o presenting with cervical pseudoarthrosis and myelopathy status post C3-4, C4-5 and C7-T1. C3-T1 PSF. C4-5 and C7-T1 arthrodesis. His intraoperative course was uncomplicated. He was admitted for pain control, therapy evaluation, and drain output monitoring. His drain output was high for the first 2 days after surgery which required continued monitoring but decreased to an acceptable level and was removed on POD3. His pain was well controlled. He was seen by therapy and deemed appropriate for discharge home.   Consults: None  Significant Diagnostic Studies: NA  Treatments: surgery: as above. Please see separately dictated operative report for further details   Discharge Exam: Blood pressure 122/81, pulse (!) 109, temperature 98.5 F (36.9 C), temperature source Oral, resp. rate 16, height 6' 1 (1.854 m), weight 104.3 kg, SpO2 98%. Strength: Side Biceps Triceps Deltoid Interossei Grip Wrist Ext. Wrist Flex.  R 5 5 4 3  4- 4- 4-  L 5 5 5 5 5 5 5     Side Iliopsoas Quads Hamstring PF DF EHL  R 5 5 5 5 5 5   L 5 5 5 5 5 5    Incision was c/d/I with staples in place Disposition: Discharge disposition: 01-Home or Self Care       Discharge Instructions     Diet - low sodium heart healthy   Complete by: As directed    Incentive spirometry RT   Complete by: As directed    Remove dressing in 48 hours   Complete by: As directed       Allergies as of 09/21/2023        Reactions   Acetaminophen  Other (See Comments)   Pt states that it makes him hyper        Medication List     TAKE these medications    albuterol  108 (90 Base) MCG/ACT inhaler Commonly known as: Ventolin  HFA INHALE 2 PUFFS INTO LUNGS EVERY 6 HOURS AS NEEDED FOR WHEEZING   atorvastatin  40 MG tablet Commonly known as: LIPITOR Take 1 tablet (40 mg total) by mouth daily.   divalproex  250 MG DR tablet Commonly known as: DEPAKOTE  Take 1 tablet (250 mg total) by mouth 3 (three) times daily.   famotidine  40 MG tablet Commonly known as: Pepcid  Take 1 tablet (40 mg total) by mouth daily.   gabapentin  300 MG capsule Commonly known as: NEURONTIN  Take 3 capsules (900 mg total) by mouth 3 (three) times daily.   ipratropium-albuterol  0.5-2.5 (3) MG/3ML Soln Commonly known as: DUONEB Take 3 mLs by nebulization 2 (two) times daily.   lisinopril  10 MG tablet Commonly known as: ZESTRIL  Take 1 tablet (10 mg total) by mouth daily.   oxyCODONE  5 MG immediate release tablet Commonly known as: Oxy IR/ROXICODONE  Take 1-2 tablets (5-10 mg total) by mouth every 4 (four) hours as needed for moderate pain (pain score 4-6).   QUEtiapine  200 MG tablet Commonly known as: SEROQUEL  Take 500 mg by mouth at bedtime.   senna 8.6 MG Tabs tablet Commonly known as: SENOKOT Take 1 tablet (8.6 mg total) by mouth  2 (two) times daily as needed for mild constipation.   Symbicort  160-4.5 MCG/ACT inhaler Generic drug: budesonide -formoterol  INHALE 2 PUFFS INTO THE LUNGS TWICE A DAY   tiZANidine  4 MG tablet Commonly known as: ZANAFLEX  Take 1 tablet (4 mg total) by mouth 3 (three) times daily.         Signed: Edsel Jama Howell 09/21/2023, 10:53 AM

## 2023-09-21 NOTE — Progress Notes (Signed)
   Neurosurgery Progress Note  History: Scott Howell is s/p C3-4, C4-5 and C7-T1. C3-T1 PSF. C4-5 and C7-T1 arthrodesis.  POD3: Pt doing well this morning and eager to discharge POD2: NAEO. POD1: doing well this morning and eager to discharge.   Physical Exam: Vitals:   09/21/23 0240 09/21/23 0716  BP: 115/72 122/81  Pulse:  (!) 109  Resp: 19 16  Temp: 98.6 F (37 C) 98.5 F (36.9 C)  SpO2: 97% 98%    AA Ox3 CNI  Strength: Side Biceps Triceps Deltoid Interossei Grip Wrist Ext. Wrist Flex.  R 5 5 4 3  4- 4- 4-  L 5 5 5 5 5 5 5     Side Iliopsoas Quads Hamstring PF DF EHL  R 5 5 5 5 5 5   L 5 5 5 5 5 5     HV output 25 overnight Incision with wound vac in place  Data:  Other tests/results: see results review  Assessment/Plan:  Scott Howell is a 53 y.o presenting with cervical myelopathy and pseudoarthrosis s/p cervical decompression and fusion  - mobilize - pain control - DVT prophylaxis - HV output has dropped off. Will remove today along with wound vac - PTOT; dispo pending recommendation  Edsel Goods PA-C Department of Neurosurgery

## 2023-09-26 NOTE — Progress Notes (Addendum)
   REFERRING PHYSICIAN:  Bernardo Sharyle Has [address] [place],  [place] [address]  DOS: 09/18/23  PSF C3-T1, decompression C3-C5 and C7-T1, posterolateral arthrodesis from C4-C5 to C7-T1  HISTORY OF PRESENT ILLNESS: Scott Howell is 2 weeks status post above surgery. Given oxycodone  and zanaflex  on discharge from the hospital.   He was seen in ED on 09/27/23- he felt like the had a seizure and fell. He has history of right ulnar neuropathy with a ulnar nerve decompression and AIN nerve transfer with Dr. Claudene. He saw Dr. Claudene who felt pain was due to trauma to ulnar nerve on right. He was given medrol  dose pack.   Steroids helped with pain, but he still has limited movement and weakness in right hand/arm. He has expected posterior neck pain into his shoulders. As above, he has right arm pain as well.   He is taking oxycodone  and zanaflex .    PHYSICAL EXAMINATION:  NEUROLOGICAL:  General: In no acute distress.   Awake, alert, oriented to person, place, and time.  Pupils equal round and reactive to light.  Facial tone is symmetric.    Strength: Side Biceps Triceps Deltoid Interossei Grip Wrist Ext. Wrist Flex.  R 5 5 5 3 3 3  4-  L 5 5 5 5 5 5 5    Side Iliopsoas Quads Hamstring PF DF EHL  R 5 5 5 5 5 5   L 5 5 5 5 5 5    Incision c/d/I, staples removed  Imaging:  Nothing new to review.   Assessment / Plan: Scott Howell is doing fair s/p above surgery. He has expected neck pain. He's lost strength and ROM of right arm since above fall/seizure. Treatment options reviewed with patient and following plan made:   - We discussed activity escalation and I have advised the patient to lift up to 10 pounds until 6 weeks after surgery (until follow up with Dr. Clois).   - Reviewed wound care.  - Continue current medications including prn oxycodone  and zanaflex . No refills needed.  - He would like to get back into PT- will review with Dr. Clois and let him  know.  - Follow up with neurology Ardath) regarding seizure. Has appointment in October and is trying to get it moved up.  - Follow up as scheduled in 4 weeks and prn. Will need xrays.   Advised to contact the office if any questions or concerns arise.   ADDENDUM 10/05/23:  Reviewed with Dr. Clois, okay to start PT for cervical spine. Will also order OT for right hand. Orders to [facility]. He will call them to schedule. Patient aware.   ADDENDUM 10/09/23:  Reviewed with Dr. Claudene. Okay to restart OT/hand therapy for his right hand.   Scott Boys PA-C Dept of Neurosurgery

## 2023-09-27 ENCOUNTER — Telehealth: Payer: Self-pay | Admitting: Neurosurgery

## 2023-09-27 ENCOUNTER — Emergency Department: Payer: MEDICAID

## 2023-09-27 ENCOUNTER — Emergency Department
Admission: EM | Admit: 2023-09-27 | Discharge: 2023-09-27 | Disposition: A | Discharge status: Home / Self Care | Payer: MEDICAID | Attending: Emergency Medicine | Admitting: Emergency Medicine

## 2023-09-27 ENCOUNTER — Other Ambulatory Visit: Payer: Self-pay | Admitting: Neurosurgery

## 2023-09-27 ENCOUNTER — Other Ambulatory Visit: Payer: Self-pay

## 2023-09-27 DIAGNOSIS — R000 Tachycardia, unspecified: Secondary | ICD-10-CM | POA: Diagnosis not present

## 2023-09-27 DIAGNOSIS — J449 Chronic obstructive pulmonary disease, unspecified: Secondary | ICD-10-CM | POA: Insufficient documentation

## 2023-09-27 DIAGNOSIS — F17210 Nicotine dependence, cigarettes, uncomplicated: Secondary | ICD-10-CM | POA: Insufficient documentation

## 2023-09-27 DIAGNOSIS — R569 Unspecified convulsions: Secondary | ICD-10-CM | POA: Insufficient documentation

## 2023-09-27 DIAGNOSIS — I10 Essential (primary) hypertension: Secondary | ICD-10-CM | POA: Diagnosis not present

## 2023-09-27 DIAGNOSIS — Z96652 Presence of left artificial knee joint: Secondary | ICD-10-CM | POA: Diagnosis not present

## 2023-09-27 DIAGNOSIS — D649 Anemia, unspecified: Secondary | ICD-10-CM | POA: Diagnosis not present

## 2023-09-27 DIAGNOSIS — G5621 Lesion of ulnar nerve, right upper limb: Secondary | ICD-10-CM | POA: Diagnosis not present

## 2023-09-27 DIAGNOSIS — M25521 Pain in right elbow: Secondary | ICD-10-CM | POA: Diagnosis present

## 2023-09-27 DIAGNOSIS — Z8616 Personal history of COVID-19: Secondary | ICD-10-CM | POA: Diagnosis not present

## 2023-09-27 LAB — CBC
HCT: 33.2 % — ABNORMAL LOW (ref 39.0–52.0)
Hemoglobin: 11.4 g/dL — ABNORMAL LOW (ref 13.0–17.0)
MCH: 30.4 pg (ref 26.0–34.0)
MCHC: 34.3 g/dL (ref 30.0–36.0)
MCV: 88.5 fL (ref 80.0–100.0)
Platelets: 342 K/uL (ref 150–400)
RBC: 3.75 MIL/uL — ABNORMAL LOW (ref 4.22–5.81)
RDW: 12.4 % (ref 11.5–15.5)
WBC: 10.5 K/uL (ref 4.0–10.5)
nRBC: 0 % (ref 0.0–0.2)

## 2023-09-27 LAB — BASIC METABOLIC PANEL WITH GFR
Anion gap: 11 (ref 5–15)
BUN: 7 mg/dL (ref 6–20)
CO2: 21 mmol/L — ABNORMAL LOW (ref 22–32)
Calcium: 8.7 mg/dL — ABNORMAL LOW (ref 8.9–10.3)
Chloride: 101 mmol/L (ref 98–111)
Creatinine, Ser: 0.72 mg/dL (ref 0.61–1.24)
GFR, Estimated: >60 mL/min (ref >60)
Glucose, Bld: 109 mg/dL — ABNORMAL HIGH (ref 70–99)
Potassium: 3.6 mmol/L (ref 3.5–5.1)
Sodium: 133 mmol/L — ABNORMAL LOW (ref 135–145)

## 2023-09-27 LAB — TROPONIN I (HIGH SENSITIVITY): Troponin I (High Sensitivity): 4 ng/L (ref <18)

## 2023-09-27 LAB — CK: Total CK: 173 U/L (ref 49–397)

## 2023-09-27 LAB — VALPROIC ACID LEVEL: Valproic Acid Lvl: 41 ug/mL — ABNORMAL LOW (ref 50–100)

## 2023-09-27 MED ORDER — METHYLPREDNISOLONE 4 MG PO TBPK: ORAL_TABLET | ORAL | 0 refills | Status: DC

## 2023-09-27 MED ORDER — KETOROLAC TROMETHAMINE 30 MG/ML IJ SOLN
30.0000 mg | Freq: Once | INTRAMUSCULAR | Status: AC
  Administered 2023-09-27 (×2): 30 mg via INTRAMUSCULAR
  Filled 2023-09-27: qty 1

## 2023-09-27 MED ORDER — OXYCODONE HCL 5 MG PO TABS: 5.0000 mg | ORAL_TABLET | ORAL | 0 refills | Status: DC | PRN

## 2023-09-27 MED ORDER — OXYCODONE HCL 5 MG PO TABS
5.0000 mg | ORAL_TABLET | Freq: Once | ORAL | Status: AC
  Administered 2023-09-27 (×2): 5 mg via ORAL
  Filled 2023-09-27: qty 1

## 2023-09-27 NOTE — Telephone Encounter (Signed)
-----   Message from Edsel Jama Goods sent at 09/27/2023  3:45 PM EDT ----- Can you call Mr. Scaletta and let know I sent in the steroid taper he discussed with him in the ER Thank you

## 2023-09-27 NOTE — ED Triage Notes (Signed)
 Discharged home after neck surgery last week. Over weekend had a seizure and fell and hit head.  Presents today with c/o right elbow pain that shoots down to fingers.

## 2023-09-27 NOTE — ED Provider Notes (Signed)
 [facility] Provider Note    Event Date/Time   First MD Initiated Contact with Patient 09/27/23 1115     (approximate)   History   No chief complaint on file.   HPI Scott Howell is a 53 y.o. male past medical history significant for multiple spine surgeries, recent cervical spine surgery, seizure disorder, who presents to the emergency department with right elbow pain.  Patient states that he recently had a hospitalization for neck surgery.  Was discharged home this past week.  States that he has been having significant amount of sweating and waking up at the middle of the night with sweating.  Denies any chest pain or shortness of breath.  States that he has had a decreased appetite.  Believes he had a seizure this past weekend and had a friend find him on the ground.  States that he had a rug burn to his forehead.  Does have a history of seizure and states that he has been compliant with his seizure medication.  Takes Depakote  and is followed by Dr. Lane and states he has an upcoming appointment.  Endorses normal urine output.  Denies dysuria, urinary urgency or frequency.  Denies any pain to his right shoulder or upper arm.  Feels like there is a sharp shooting pain from his right elbow to his right hand.  States that he feels like he is has numbness and tingling sensation to his 4th and 5th finger.  States that he has had this previously.  Uncertain if he injured his elbow or neck during his seizure.  Poor appetite since that time.  Taking oxycodone  for pain control.  Endorses an allergy to Tylenol .     Physical Exam   Triage Vital Signs: ED Triage Vitals  Encounter Vitals Group     BP 09/27/23 0807 (!) 151/103     Girls Systolic BP Percentile --      Girls Diastolic BP Percentile --      Boys Systolic BP Percentile --      Boys Diastolic BP Percentile --      Pulse Rate 09/27/23 0807 89     Resp 09/27/23 0807 16     Temp 09/27/23 0807 98 F (36.7 C)      Temp Source 09/27/23 0807 Oral     SpO2 09/27/23 0807 100 %     Weight 09/27/23 0808 229 lb 15 oz (104.3 kg)     Height --      Head Circumference --      Peak Flow --      Pain Score 09/27/23 0807 9     Pain Loc --      Pain Education --      Exclude from Growth Chart --     Most recent vital signs: Vitals:   09/27/23 1151 09/27/23 1355  BP: 126/85 120/80  Pulse: 90 95  Resp: 18 18  Temp: 98 F (36.7 C) 98.4 F (36.9 C)  SpO2: 99% 98%    Physical Exam Constitutional:      Appearance: He is well-developed.  HENT:     Head: Atraumatic.  Eyes:     Extraocular Movements: Extraocular movements intact.     Conjunctiva/sclera: Conjunctivae normal.     Pupils: Pupils are equal, round, and reactive to light.  Cardiovascular:     Rate and Rhythm: Regular rhythm.  Pulmonary:     Effort: No respiratory distress.  Abdominal:     Tenderness: There is  no abdominal tenderness.  Musculoskeletal:     Cervical back: Normal range of motion.     Comments: Focal tenderness to palpation to the right elbow with reproducible pain.  Decree sensation to the right 4th and 5th fingers and mild decrease sensation to the third finger.  Good capillary refill.  +2 ulnar and radial pulses.  No obvious swelling, erythema or warmth.  No induration.  Able to range his right elbow.  No external signs of trauma.  Prior surgical scars.  Skin:    General: Skin is warm.     Capillary Refill: Capillary refill takes less than 2 seconds.  Neurological:     Mental Status: He is alert and oriented to person, place, and time. Mental status is at baseline.     IMPRESSION / MDM / ASSESSMENT AND PLAN / ED COURSE  I reviewed the triage vital signs and the nursing notes.  Differential diagnosis including breakthrough seizure, electrolyte abnormality, dehydration, rhabdomyolysis, intracranial hemorrhage, radiculopathy, ACS  EKG  I, Clotilda Punter, the attending physician, personally viewed and interpreted  this ECG.  EKG showed sinus tachycardia with a heart rate of 102.  No significant ST elevation or depression.  No findings of acute ischemia or dysrhythmia.  Normal intervals.  No chamber enlargement.   RADIOLOGY I independently reviewed imaging, my interpretation of imaging: CT scan of the head -no acute finding  CT scan of the neck -postop changes  X-ray of the right elbow with no acute fracture or dislocation.  Wires in place which was felt to be possibly external to the patient however patient states that this is from her prior surgery.  LABS (all labs ordered are listed, but only abnormal results are displayed) Labs interpreted as -    Labs Reviewed  CBC - Abnormal; Notable for the following components:      Result Value   RBC 3.75 (*)    Hemoglobin 11.4 (*)    HCT 33.2 (*)    All other components within normal limits  BASIC METABOLIC PANEL WITH GFR - Abnormal; Notable for the following components:   Sodium 133 (*)    CO2 21 (*)    Glucose, Bld 109 (*)    Calcium  8.7 (*)    All other components within normal limits  VALPROIC ACID  LEVEL - Abnormal; Notable for the following components:   Valproic Acid  Lvl 41 (*)    All other components within normal limits  CK  TROPONIN I (HIGH SENSITIVITY)     MDM  Ordered his home oxycodone  and Toradol   CT scan of the head and cervical spine with no acute findings and read as postoperative changes.  X-ray of the right elbow with no acute fracture or dislocation.  Lab work overall reassuring.  No signs of an infectious process.  No signs of rhabdomyolysis.  Depakote  level was low.  Discussed the patient's case with neurosurgery on-call, Dr. Claudene came and evaluated the patient in the emergency department, most likely with an ulnar neuropathy, recommended outpatient follow-up and will call the patient of their, call him and a steroid.  No other further recommendations at this time.  Discussed return precautions with the patient.   Discussed that he needs to follow-up with his neurologist to possibly change his seizure medication.  Discussed calling his primary care doctor tomorrow.  Discussed return precautions for any ongoing or worsening symptoms     PROCEDURES:  Critical Care performed: No  Procedures  Patient's presentation is most consistent with acute  presentation with potential threat to life or bodily function.   MEDICATIONS ORDERED IN ED: Medications  oxyCODONE  (Oxy IR/ROXICODONE ) immediate release tablet 5 mg (5 mg Oral Given 09/27/23 1149)  ketorolac  (TORADOL ) 30 MG/ML injection 30 mg (30 mg Intramuscular Given 09/27/23 1150)    FINAL CLINICAL IMPRESSION(S) / ED DIAGNOSES   Final diagnoses:  Anemia, unspecified type  Ulnar neuropathy at elbow of right upper extremity  Seizure (HCC)     Rx / DC Orders   ED Discharge Orders     None        Note:  This document was prepared using Dragon voice recognition software and may include unintentional dictation errors.   Suzanne Kirsch, MD 09/27/23 1958

## 2023-09-27 NOTE — Progress Notes (Signed)
 MDP placed and sent to pharmacy on file per conversation with Dr. Claudene

## 2023-09-27 NOTE — Discharge Instructions (Addendum)
 You were seen in the emergency department for pain to your right elbow that shot down to your right hand.  Concerned that you have an ulnar neuropathy.  Continue to take your pain medication as prescribed.  Neurosurgery team will be calling you to discuss starting a steroid.  Your Depakote  level was low in the emergency department and you had a breakthrough seizure this weekend.  Call your neurologist today to discuss whether you need to increase your seizure medication.  Pain control:  Ibuprofen  (motrin /aleve/advil ) - You can take 3 tablets (600 mg) every 6 hours as needed for pain/fever.  You can alternate these medications or take them together.  Make sure you eat food/drink water  when taking these medications.  Constipation from your opioids - take 1 capfull of MiraLAX , can repeat untill having regular bowel movements.  Increase to 2 capfuls if not successful with having a bowel movement.  Continue on your stool softeners.  Keep this medication out of reach of any children. You cannot drive/work while taking this medication.  You also had findings of anemia on your lab work today.  Follow-up with your primary care physician so they can recheck your lab work.  Return to the emergency department for any worsening symptoms.

## 2023-09-27 NOTE — ED Notes (Signed)
 This RN went over d/c paperwork and medications. Pt verbalized understanding. 2 bus vouchers provided.

## 2023-09-27 NOTE — Consult Note (Signed)
 Consulting Department:  Emergency department  Primary Physician:  Bernardo Fend, DO  Chief Complaint: Right upper extremity pain  History of Present Illness: 09/27/2023 Scott Howell is a 53 y.o. male who presents with the chief complaint of right upper extremity pain.  He has complicated history, he has had a recent posterior cervical decompression and fusion within the last 2 weeks by Dr. Clois.  He states that he is doing well and was recovering.  Unfortunately this weekend sometime around Friday or Saturday he had a seizure and felt like he did not wake up until Monday when a friend found him.  States that he felt like he may have been down the entire time, ever since he woke up he has had burning dyskinesias in the medial aspect of his arm and into his medial aspect of his hand.  He does have a history of an ulnar neuropathy with a ulnar nerve decompression and AIN nerve transfer, he did well with that procedure, however unfortunately his symptoms seem to be focused at the ulnar nerve at the right elbow.  The symptoms are causing a significant impact on the patient's life.   Review of Systems:  A 10 point review of systems is negative, except for the pertinent positives and negatives detailed in the HPI.  Past Medical History: Past Medical History:  Diagnosis Date   Adenomatous polyp of colon    Arthritis    Asthma    Benign fibroma of prostate    Bipolar 1 disorder (HCC)    Cerebellar stroke (subacute) 11/25/2020   Cerebral microvascular disease    Cervical myelopathy (HCC)    Cervical post-laminectomy syndrome    Cervical spinal cord compression (HCC)    Chronic pain associated with significant psychosocial dysfunction    Constipation    COPD (chronic obstructive pulmonary disease) (HCC)    Dyspnea    Erectile dysfunction    Fibromyalgia    GERD (gastroesophageal reflux disease)    Headache    High cholesterol    History of 2019 novel coronavirus disease  (COVID-19) 11/01/2021   History of transient cerebral ischemia 09/09/2010   Homicidal ideation    Hypertension    Illiterate    pt can sign his name but states that he is very limited in his reading and writing   Living accommodation issues    a.) as of 03/29/2021 --> living in camper   Lower back pain    Lumbar canal stenosis    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   Pneumonia    Polysubstance abuse (HCC)    a.) opioids + THC + ETOH   Schizophrenia (HCC)    Seizures (HCC) 11/2020   Stroke (HCC)    no deficits-around 2010   Tobacco use     Past Surgical History: Past Surgical History:  Procedure Laterality Date   ANTERIOR CERVICAL DECOMP/DISCECTOMY FUSION N/A 01/13/2021   Procedure: C3-5 ANTERIOR CERVICAL DECOMPRESSION/DISCECTOMY FUSION;  Surgeon: Clois Fret, MD;  Location: [facility];  Service: Neurosurgery;  Laterality: N/A;   APPLICATION OF INTRAOPERATIVE CT SCAN N/A 09/18/2023   Procedure: APPLICATION OF INTRAOPERATIVE CT SCAN;  Surgeon: Clois Fret, MD;  Location: [facility];  Service: Neurosurgery;  Laterality: N/A;   CERVICAL SPINE SURGERY     4   CLAVICLE SURGERY Left    COLONOSCOPY WITH PROPOFOL  N/A 06/29/2022   Procedure: COLONOSCOPY WITH PROPOFOL ;  Surgeon: Therisa Bi, MD;  Location: [facility] ENDOS[facility];  Service: Gastroenterology;  Laterality: N/A;  USES MED TRANS; WILL NEED TIME ON FRIDAY, 06/24/2022   HARDWARE REMOVAL Left 04/20/2015   Procedure: HARDWARE REMOVAL left leg;  Surgeon: Kayla Pinal, MD;  Location: [facility];  Service: Orthopedics;  Laterality: Left;   KNEE SURGERY Left    NECK SURGERY     x6   POSTERIOR CERVICAL FUSION/FORAMINOTOMY N/A 09/18/2023   Procedure: POSTERIOR CERVICAL FUSION/FORAMINOTOMY LEVEL 5;  Surgeon: Clois Fret, MD;  Location: [facility];  Service: Neurosurgery;  Laterality: N/A;  C3-T1 POSTERIOR SPINAL FUSION, C3-5 POSTERIOR SPINAL DECOMPRESSION, C7-T1 LAMINOFORAMINOTOMY   SHOULDER SURGERY Left    TOTAL KNEE  ARTHROPLASTY Left 04/12/2021   Procedure: TOTAL KNEE ARTHROPLASTY;  Surgeon: Leora Lynwood SAUNDERS, MD;  Location: [facility];  Service: Orthopedics;  Laterality: Left;   ULNAR NERVE TRANSPOSITION Right 11/17/2021   Procedure: SUBCUTANEOUS TRANSPOSITION OF ULNAR NERVE, RIGHT ELBOW;  Surgeon: Edie Norleen PARAS, MD;  Location: [facility];  Service: Orthopedics;  Laterality: Right;   ULNAR NERVE TRANSPOSITION Right 10/04/2022   Procedure: ULNAR NERVE TRANSPOSITION;  Surgeon: Claudene Penne ORN, MD;  Location: [facility];  Service: Neurosurgery;  Laterality: Right;  NO BLOCK    Allergies: Allergies as of 09/27/2023 - Review Complete 09/27/2023  Allergen Reaction Noted   Acetaminophen  Other (See Comments) 07/06/2014    Medications: No current facility-administered medications for this encounter.  Current Outpatient Medications:    albuterol  (VENTOLIN  HFA) 108 (90 Base) MCG/ACT inhaler, INHALE 2 PUFFS INTO LUNGS EVERY 6 HOURS AS NEEDED FOR WHEEZING, Disp: 18 each, Rfl: 2   atorvastatin  (LIPITOR) 40 MG tablet, Take 1 tablet (40 mg total) by mouth daily., Disp: 90 tablet, Rfl: 1   divalproex  (DEPAKOTE ) 250 MG DR tablet, Take 1 tablet (250 mg total) by mouth 3 (three) times daily., Disp: 90 tablet, Rfl: 1   famotidine  (PEPCID ) 40 MG tablet, Take 1 tablet (40 mg total) by mouth daily., Disp: 90 tablet, Rfl: 1   gabapentin  (NEURONTIN ) 300 MG capsule, Take 3 capsules (900 mg total) by mouth 3 (three) times daily., Disp: 270 capsule, Rfl: 3   ipratropium-albuterol  (DUONEB) 0.5-2.5 (3) MG/3ML SOLN, Take 3 mLs by nebulization 2 (two) times daily., Disp: 360 mL, Rfl: 1   lisinopril  (ZESTRIL ) 10 MG tablet, Take 1 tablet (10 mg total) by mouth daily., Disp: 90 tablet, Rfl: 1   oxyCODONE  (OXY IR/ROXICODONE ) 5 MG immediate release tablet, Take 1-2 tablets (5-10 mg total) by mouth every 4 (four) hours as needed for moderate pain (pain score 4-6)., Disp: 45 tablet, Rfl: 0   QUEtiapine  (SEROQUEL ) 200 MG tablet, Take 500 mg by  mouth at bedtime., Disp: , Rfl:    senna (SENOKOT) 8.6 MG TABS tablet, Take 1 tablet (8.6 mg total) by mouth 2 (two) times daily as needed for mild constipation., Disp: 30 tablet, Rfl: 0   SYMBICORT  160-4.5 MCG/ACT inhaler, INHALE 2 PUFFS INTO THE LUNGS TWICE A DAY, Disp: 10.2 each, Rfl: 3   tiZANidine  (ZANAFLEX ) 4 MG tablet, Take 1 tablet (4 mg total) by mouth 3 (three) times daily., Disp: 90 tablet, Rfl: 0   Social History: Social History   Tobacco Use   Smoking status: Every Day    Current packs/day: 1.50    Average packs/day: 1.5 packs/day for 30.0 years (45.0 ttl pk-yrs)    Types: Cigarettes   Smokeless tobacco: Never   Tobacco comments:    5 CIGARETTES A DAY  Vaping Use   Vaping status: Never Used  Substance Use Topics   Alcohol use: No    Comment:  Sober for 10 yrs.   Drug use: Yes    Frequency: 7.0 times per week    Types: Marijuana    Family Medical History: Family History  Problem Relation Age of Onset   Diabetes Mother    Hyperlipidemia Mother    Hypertension Mother    Diabetes Maternal Aunt    Cancer Maternal Aunt    Hyperlipidemia Maternal Aunt    Hypertension Maternal Aunt    Diabetes Maternal Uncle    Cancer Maternal Uncle    Hyperlipidemia Maternal Uncle    Hypertension Maternal Uncle     Physical Examination: Vitals:   09/27/23 1151 09/27/23 1355  BP: 126/85 120/80  Pulse: 90 95  Resp: 18 18  Temp: 98 F (36.7 C) 98.4 F (36.9 C)  SpO2: 99% 98%     General: Patient is well developed, well nourished, calm, collected, and in no apparent distress.  NEUROLOGICAL:  General: In no acute distress.   Awake, alert, oriented to person, place, and time.  Pupils equal round and reactive to light.  Facial tone is symmetric.  Tongue protrusion is midline.  There is no pronator drift.  He holds his hand in an ulnar clawlike position.  He has minimal activation of his intrinsic ulnar muscles, however there is activation present and he does feel like this  is gotten slightly better than it was immediately after he woke up.  He has a severe Tinel's sign at the right elbow, he states that this is new.  There is no evidence of fluid collection.  No mass.  Imaging: CT Head Wo Contrast Result Date: 09/27/2023 EXAM: CT HEAD AND CERVICAL SPINE 09/27/2023 12:02:28 PM TECHNIQUE: CT of the head and cervical spine was performed without the administration of intravenous contrast. Multiplanar reformatted images are provided for review. Automated exposure control, iterative reconstruction, and/or weight based adjustment of the mA/kV was utilized to reduce the radiation dose to as low as reasonably achievable. COMPARISON: CT head and cervical spine 07/09/2023. CLINICAL HISTORY: Polytrauma, blunt; head trauma, seizure. Discharged home after neck surgery last week. Over weekend had a seizure and fell and hit head. Presents today with c/o right elbow pain that shoots down to fingers. FINDINGS: CT HEAD BRAIN AND VENTRICLES: No acute intracranial hemorrhage. No mass effect or midline shift. No abnormal extra-axial fluid collection. Gray-white differentiation is maintained. No hydrocephalus. ORBITS: Chronic depression of the lamina papyracea bilaterally. SINUSES AND MASTOIDS: Chronic mucosal thickening and calcification/small osteoma in the right frontal sinus. Clear mastoid air cells. SOFT TISSUES AND SKULL: Mild left forehead soft tissue swelling. No acute skull fracture. CT CERVICAL SPINE BONES AND ALIGNMENT: Trace anterolisthesis of C7 on T1. No acute fracture or suspicious bone lesion. DEGENERATIVE CHANGES: Previous C3 to C7 ACDF with lack of solid arthrodesis at C4-5. Interval extension of the prior posterior fusion which now spans C3 to T1 with articular pillar screws in place bilaterally at each level except C6. Solid osseous fusion across the facets from C5 to C7. Interval C3 to C5 posterior decompression and C7-T1 foraminotomies. No evidence of screw loosening. SOFT  TISSUES: Postoperative changes throughout the posterior neck soft tissues with a small amount of scattered gas and with skin staples in place. Subcutaneous fluid collection in the midline at C7-T1 measuring 6 x 3.5 cm. VASCULATURE: Mild atherosclerotic calcification at the carotid bifurcations. IMPRESSION: 1. No evidence of acute intracranial abnormality or acute cervical spine fracture 2. Recent postoperative changes in the cervical spine as detailed above. Electronically signed by: Dasie  Derrill MD 09/27/2023 12:37 PM EDT RP Workstation: [id number]   CT Cervical Spine Wo Contrast Result Date: 09/27/2023 EXAM: CT HEAD AND CERVICAL SPINE 09/27/2023 12:02:28 PM TECHNIQUE: CT of the head and cervical spine was performed without the administration of intravenous contrast. Multiplanar reformatted images are provided for review. Automated exposure control, iterative reconstruction, and/or weight based adjustment of the mA/kV was utilized to reduce the radiation dose to as low as reasonably achievable. COMPARISON: CT head and cervical spine 07/09/2023. CLINICAL HISTORY: Polytrauma, blunt; head trauma, seizure. Discharged home after neck surgery last week. Over weekend had a seizure and fell and hit head. Presents today with c/o right elbow pain that shoots down to fingers. FINDINGS: CT HEAD BRAIN AND VENTRICLES: No acute intracranial hemorrhage. No mass effect or midline shift. No abnormal extra-axial fluid collection. Gray-white differentiation is maintained. No hydrocephalus. ORBITS: Chronic depression of the lamina papyracea bilaterally. SINUSES AND MASTOIDS: Chronic mucosal thickening and calcification/small osteoma in the right frontal sinus. Clear mastoid air cells. SOFT TISSUES AND SKULL: Mild left forehead soft tissue swelling. No acute skull fracture. CT CERVICAL SPINE BONES AND ALIGNMENT: Trace anterolisthesis of C7 on T1. No acute fracture or suspicious bone lesion. DEGENERATIVE CHANGES: Previous C3 to C7 ACDF  with lack of solid arthrodesis at C4-5. Interval extension of the prior posterior fusion which now spans C3 to T1 with articular pillar screws in place bilaterally at each level except C6. Solid osseous fusion across the facets from C5 to C7. Interval C3 to C5 posterior decompression and C7-T1 foraminotomies. No evidence of screw loosening. SOFT TISSUES: Postoperative changes throughout the posterior neck soft tissues with a small amount of scattered gas and with skin staples in place. Subcutaneous fluid collection in the midline at C7-T1 measuring 6 x 3.5 cm. VASCULATURE: Mild atherosclerotic calcification at the carotid bifurcations. IMPRESSION: 1. No evidence of acute intracranial abnormality or acute cervical spine fracture 2. Recent postoperative changes in the cervical spine as detailed above. Electronically signed by: Dasie Derrill MD 09/27/2023 12:37 PM EDT RP Workstation: [id number]   DG Elbow Complete Right Result Date: 09/27/2023 CLINICAL DATA:  Right elbow pain after fall. EXAM: RIGHT ELBOW - COMPLETE 3+ VIEW COMPARISON:  None Available. FINDINGS: There is no evidence of fracture, dislocation, or joint effusion. There is no evidence of arthropathy or other focal bone abnormality. Metallic wire and leads are seen in soft tissues distal upper arm which most likely are external; clinical correlation is recommended. IMPRESSION: No fracture or dislocation. Metallic wire and leads seen in soft tissues of distal upper arm which most likely are external; clinical correlation is recommended. Electronically Signed   By: Lynwood Landy Raddle M.D.   On: 09/27/2023 10:17     I have personally reviewed the images and agree with the above interpretation.  Labs:    Latest Ref Rng & Units 09/27/2023   11:42 AM 07/09/2023    4:49 PM 06/01/2023    1:23 PM  CBC  WBC 4.0 - 10.5 K/uL 10.5  9.0  8.7   Hemoglobin 13.0 - 17.0 g/dL 88.5  86.7  85.7   Hematocrit 39.0 - 52.0 % 33.2  37.5  41.9   Platelets 150 - 400 K/uL  342  230  251       Latest Ref Rng & Units 09/27/2023   11:42 AM 07/09/2023    4:49 PM 06/01/2023    1:23 PM  BMP  Glucose 70 - 99 mg/dL 890  96  79   BUN 6 -  20 mg/dL 7  8  6    Creatinine 0.61 - 1.24 mg/dL 9.27  9.26  9.27   BUN/Creat Ratio 6 - 22 (calc)   8   Sodium 135 - 145 mmol/L 133  131  134   Potassium 3.5 - 5.1 mmol/L 3.6  3.5  4.4   Chloride 98 - 111 mmol/L 101  99  97   CO2 22 - 32 mmol/L 21  20  27    Calcium  8.9 - 10.3 mg/dL 8.7  8.9  9.6         Assessment and Plan: Mr. Jaquith is a pleasant 53 y.o. 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  He was taken to the operating room earlier this month for a cervical decompression and fusion.  He is doing well and recovering at home.  He unfortunately had a seizure sometime on Friday and was not found until possibly Monday.  He feels like he was unconscious for the entire time.  When he woke up he had severe pain burning in the medial aspect of his forearm down into his medial fingers.  This tends to follow a ulnar nerve distribution, he does have a history of a ulnar nerve decompression.  He initially did well with that procedure and had improvement in his ulnar nerve intrinsic function.  It does appear now that he has worsened ulnar function, however per his report he feels like this is slightly improving since he initially woke up on Monday.  He is more bothered mostly by the discomfort and pain radiating from his elbow to the medial hand.  It is likely that he either had a direct trauma to the ulnar nerve as he fell to the ground during his seizure, or since he was unconscious could have had it in a unfavorable position which caused a palsy/neuritis.  His imaging of his cervical spine appear at baseline with no new areas of fracture or compression.  This seems to be focalized/localized all to his right ulnar nerve at the elbow, likely compressive in etiology.  At this point its mostly  supportive care.  Will continue to follow.  I will discuss with Dr. Clois whether or not he would be okay to undergo steroid treatment, he is in the setting of a posterior spinal fusion.  Otherwise let the patient know that is mostly time that helps with these external compression palsies   Penne MICAEL Sharps, MD/MSCR Dept. of Neurosurgery

## 2023-10-03 ENCOUNTER — Other Ambulatory Visit: Payer: Self-pay | Admitting: Internal Medicine

## 2023-10-03 DIAGNOSIS — J449 Chronic obstructive pulmonary disease, unspecified: Secondary | ICD-10-CM

## 2023-10-04 ENCOUNTER — Encounter: Payer: Self-pay | Admitting: Orthopedic Surgery

## 2023-10-04 ENCOUNTER — Ambulatory Visit (INDEPENDENT_AMBULATORY_CARE_PROVIDER_SITE_OTHER): Payer: MEDICAID | Admitting: Orthopedic Surgery

## 2023-10-04 ENCOUNTER — Telehealth: Payer: Self-pay | Admitting: Orthopedic Surgery

## 2023-10-04 VITALS — BP 130/82 | Temp 97.8°F | Ht 73.0 in | Wt 229.0 lb

## 2023-10-04 DIAGNOSIS — M960 Pseudarthrosis after fusion or arthrodesis: Secondary | ICD-10-CM

## 2023-10-04 DIAGNOSIS — G5621 Lesion of ulnar nerve, right upper limb: Secondary | ICD-10-CM

## 2023-10-04 DIAGNOSIS — Z981 Arthrodesis status: Secondary | ICD-10-CM

## 2023-10-04 MED ORDER — OXYCODONE HCL 5 MG PO TABS
5.0000 mg | ORAL_TABLET | ORAL | 0 refills | Status: DC | PRN
Start: 2023-10-04 — End: 2023-10-11

## 2023-10-04 NOTE — Telephone Encounter (Signed)
 Patient was seen this morning- he called the office stating that you offered to send in his refill in today and he refused.  He would like for us  to go ahead and send the medications for him to pick up from [facility] on Friday

## 2023-10-04 NOTE — Telephone Encounter (Signed)
 Patient notified and expressed understanding.

## 2023-10-04 NOTE — Telephone Encounter (Signed)
 Requested medication (s) are due for refill today: na   Requested medication (s) are on the active medication list: yes   Last refill:  05/12/23 #10.2 each 3 refills  Future visit scheduled: yes 12/01/23  Notes to clinic:  do you want to refill #10.2 each?      Requested Prescriptions  Pending Prescriptions Disp Refills   SYMBICORT  160-4.5 MCG/ACT inhaler [Pharmacy Med Name: SYMBICORT  160-4.5 MCG INHALER] 10.2 each 3    Sig: INHALE 2 PUFFS INTO THE LUNGS TWICE A DAY     Pulmonology:  [facility] [employer] Passed - 10/04/2023  2:30 PM      Passed - Valid encounter within last 12 months    Recent Outpatient Visits           2 months ago Left knee pain, unspecified chronicity   [facility] Bernardo Fend, DO   4 months ago Hypertension goal BP (blood pressure) < 140/90   [facility] Bernardo Fend, [place]

## 2023-10-04 NOTE — Telephone Encounter (Signed)
 DOS: 09/18/23  PSF C3-T1, decompression C3-C5 and C7-T1, posterolateral arthrodesis from C4-C5 to C7-T1   PMP reviewed and is appropriate.   Oxycodone  sent to [facility]. Please let him know.

## 2023-10-05 NOTE — Addendum Note (Signed)
 Addended byBETHA HILMA HASTINGS on: 10/05/2023 10:20 AM   Modules accepted: Orders

## 2023-10-11 ENCOUNTER — Telehealth: Payer: Self-pay | Admitting: Neurosurgery

## 2023-10-11 DIAGNOSIS — M960 Pseudarthrosis after fusion or arthrodesis: Secondary | ICD-10-CM

## 2023-10-11 DIAGNOSIS — Z981 Arthrodesis status: Secondary | ICD-10-CM

## 2023-10-11 MED ORDER — OXYCODONE HCL 5 MG PO TABS: 5.0000 mg | ORAL_TABLET | ORAL | 0 refills | Status: DC | PRN

## 2023-10-11 NOTE — Telephone Encounter (Signed)
 DOS: 09/18/23  PSF C3-T1, decompression C3-C5 and C7-T1, posterolateral arthrodesis from C4-C5 to C7-T1   PMP reviewed and is appropriate. Oxycodone  sent to pharmacy. Let him know I kept the directions the same, but we will try to go to q 6 hours at next refill.   He should start OT for his right hand. Please make him a follow up with Dr. Claudene for the right hand in about 6 weeks.

## 2023-10-11 NOTE — Telephone Encounter (Signed)
 I spoke to patient and he is scheduled for OT. Appointment has been made.

## 2023-10-11 NOTE — Telephone Encounter (Signed)
 Patient called stating he needs a refill of his oxyCODONE  (OXY IR/ROXICODONE ) 5 MG immediate release tablet in regards to the neck surgery he had recently.   Patient also wanted to inform us  that he has no use in his right hand- he states it is numb but he is unable to grasp anything or use it with normal function. He says the pain starts right at his elbow where the incision site is but he is unsure of what to do. Please advise

## 2023-10-17 ENCOUNTER — Ambulatory Visit: Payer: MEDICAID | Attending: Neurosurgery | Admitting: Physical Therapy

## 2023-10-17 ENCOUNTER — Ambulatory Visit: Payer: MEDICAID | Admitting: Physical Therapy

## 2023-10-17 ENCOUNTER — Other Ambulatory Visit: Payer: Self-pay

## 2023-10-17 ENCOUNTER — Encounter: Payer: Self-pay | Admitting: Physical Therapy

## 2023-10-17 DIAGNOSIS — R262 Difficulty in walking, not elsewhere classified: Secondary | ICD-10-CM | POA: Diagnosis present

## 2023-10-17 DIAGNOSIS — Z981 Arthrodesis status: Secondary | ICD-10-CM | POA: Diagnosis present

## 2023-10-17 DIAGNOSIS — R278 Other lack of coordination: Secondary | ICD-10-CM | POA: Insufficient documentation

## 2023-10-17 DIAGNOSIS — M960 Pseudarthrosis after fusion or arthrodesis: Secondary | ICD-10-CM | POA: Insufficient documentation

## 2023-10-17 DIAGNOSIS — M542 Cervicalgia: Secondary | ICD-10-CM | POA: Diagnosis present

## 2023-10-17 DIAGNOSIS — R2681 Unsteadiness on feet: Secondary | ICD-10-CM | POA: Diagnosis present

## 2023-10-17 DIAGNOSIS — G5621 Lesion of ulnar nerve, right upper limb: Secondary | ICD-10-CM | POA: Insufficient documentation

## 2023-10-17 DIAGNOSIS — M6281 Muscle weakness (generalized): Secondary | ICD-10-CM | POA: Insufficient documentation
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
## 2023-10-18 ENCOUNTER — Encounter: Payer: MEDICAID | Admitting: Physical Therapy

## 2023-10-18 ENCOUNTER — Telehealth: Payer: Self-pay | Admitting: Orthopedic Surgery

## 2023-10-18 DIAGNOSIS — Z981 Arthrodesis status: Secondary | ICD-10-CM

## 2023-10-18 DIAGNOSIS — M960 Pseudarthrosis after fusion or arthrodesis: Secondary | ICD-10-CM

## 2023-10-18 MED ORDER — OXYCODONE HCL 5 MG PO TABS: 5.0000 mg | ORAL_TABLET | Freq: Four times a day (QID) | ORAL | 0 refills | Status: DC | PRN

## 2023-10-18 NOTE — Telephone Encounter (Signed)
 Prescription Request  10/18/2023  LOV: 10/04/2023  What is the name of the medication or equipment? oxyCODONE  (OXY IR/ROXICODONE ) 5 MG immediate release tablet   Have you contacted your pharmacy to request a refill? No   Which pharmacy would you like this sent to?  [id number] [place], [place] - [address] [place] [address] Phone: [phone] Fax: [phone]    Patient notified that their request is being sent to the clinical staff for review and that they should receive a response within 2 business days.   Please advise at [facility] [phone]

## 2023-10-18 NOTE — Telephone Encounter (Signed)
Left patient a detailed voicemail.

## 2023-10-18 NOTE — Telephone Encounter (Signed)
 DOS: 09/18/23  PSF C3-T1, decompression C3-C5 and C7-T1, posterolateral arthrodesis from C4-C5 to C7-T1      PMP reviewed and is appropriate.   Will change directions to every 6 hours prn. Please let him know. Sent to pharmacy.

## 2023-10-19 ENCOUNTER — Encounter: Payer: MEDICAID | Admitting: Physical Therapy

## 2023-10-19 ENCOUNTER — Encounter: Payer: MEDICAID | Admitting: Neurosurgery

## 2023-10-19 ENCOUNTER — Ambulatory Visit: Payer: MEDICAID
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
## 2023-10-19 NOTE — Therapy (Signed)
 Pt arrived in error but not seen.  Inocente Blazing, MS, OTR/L
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
## 2023-10-23 ENCOUNTER — Encounter: Payer: MEDICAID | Admitting: Physical Therapy

## 2023-10-24 ENCOUNTER — Ambulatory Visit: Payer: MEDICAID

## 2023-10-24 DIAGNOSIS — M6281 Muscle weakness (generalized): Secondary | ICD-10-CM

## 2023-10-24 DIAGNOSIS — M542 Cervicalgia: Secondary | ICD-10-CM | POA: Diagnosis not present

## 2023-10-24 DIAGNOSIS — R278 Other lack of coordination: Secondary | ICD-10-CM

## 2023-10-24 DIAGNOSIS — R262 Difficulty in walking, not elsewhere classified: Secondary | ICD-10-CM

## 2023-10-24 DIAGNOSIS — R2681 Unsteadiness on feet: Secondary | ICD-10-CM

## 2023-10-24 NOTE — Therapy (Addendum)
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 -postural training -dynamic balance      Chiquita Silvan, SPT Physical Therapy Student - [place]  [facility]   Lenox. Fairly IV, PT, DPT Physical Therapist- [place]  [facility] 4:27 PM 10/24/23

## 2023-10-25 ENCOUNTER — Telehealth: Payer: Self-pay | Admitting: Neurosurgery

## 2023-10-25 ENCOUNTER — Encounter: Payer: MEDICAID | Admitting: Physical Therapy

## 2023-10-25 ENCOUNTER — Other Ambulatory Visit: Payer: Self-pay | Admitting: Neurosurgery

## 2023-10-25 DIAGNOSIS — Z981 Arthrodesis status: Secondary | ICD-10-CM

## 2023-10-25 DIAGNOSIS — M960 Pseudarthrosis after fusion or arthrodesis: Secondary | ICD-10-CM

## 2023-10-25 MED ORDER — OXYCODONE HCL 5 MG PO TABS: 5.0000 mg | ORAL_TABLET | Freq: Three times a day (TID) | ORAL | 0 refills | Status: DC | PRN

## 2023-10-25 NOTE — Telephone Encounter (Signed)
 Patient notified

## 2023-10-25 NOTE — Telephone Encounter (Signed)
 Prescription Request  10/25/2023  LOV: 10/04/2023  What is the name of the medication or equipment? oxyCODONE  (OXY IR/ROXICODONE ) 5 MG immediate release tablet   Have you contacted your pharmacy to request a refill? No   Which pharmacy would you like this sent to?  [id number] [place],  - [address] [place] [address] Phone: [phone] Fax: [phone]    Patient notified that their request is being sent to the clinical staff for review and that they should receive a response within 2 business days.   Please advise at [facility] [phone]

## 2023-10-26 ENCOUNTER — Ambulatory Visit: Payer: MEDICAID

## 2023-10-26 DIAGNOSIS — G5621 Lesion of ulnar nerve, right upper limb: Secondary | ICD-10-CM

## 2023-10-26 DIAGNOSIS — M6281 Muscle weakness (generalized): Secondary | ICD-10-CM

## 2023-10-26 DIAGNOSIS — Z981 Arthrodesis status: Secondary | ICD-10-CM

## 2023-10-26 DIAGNOSIS — M542 Cervicalgia: Secondary | ICD-10-CM | POA: Diagnosis not present

## 2023-10-26 DIAGNOSIS — R278 Other lack of coordination: Secondary | ICD-10-CM

## 2023-10-27 ENCOUNTER — Other Ambulatory Visit: Payer: Self-pay

## 2023-10-27 DIAGNOSIS — G959 Disease of spinal cord, unspecified: Secondary | ICD-10-CM

## 2023-10-27 DIAGNOSIS — M4802 Spinal stenosis, cervical region: Secondary | ICD-10-CM

## 2023-10-28 NOTE — Therapy (Signed)
 OUTPATIENT OCCUPATIONAL THERAPY ORTHO EVALUATION  Patient Name: Scott Howell MRN: [id number] DOB:[birth year], 53 y.o., male Today's Date: 10/26/2023  PCP: Dr. Sharyle Fischer  REFERRING PROVIDER: Glade Boys, PA-C  Dr. Clois (Neuro surgeon- Performed pt's cervical fusions) Dr. Penne Sharps (Neuro surgeon- Performed pt's R ulnar nerve transposition) Dr. Lane (Neurologist treating pt's seizures)  END OF SESSION:   OT End of Session - 10/28/23 1447     Visit Number 1    Number of Visits 24    Date for OT Re-Evaluation 01/19/24    Authorization Time Period Reporting period beginning 10/26/23    Progress Note Due on Visit 10    OT Start Time 0845    OT Stop Time 0930    OT Time Calculation (min) 45 min    Equipment Utilized During Treatment None    Activity Tolerance Patient tolerated treatment well    Behavior During Therapy WFL for tasks assessed/performed         Past Medical History:  Diagnosis Date   Adenomatous polyp of colon    Arthritis    Asthma    Benign fibroma of prostate    Bipolar 1 disorder (HCC)    Cerebellar stroke (subacute) 11/25/2020   Cerebral microvascular disease    Cervical myelopathy (HCC)    Cervical post-laminectomy syndrome    Cervical spinal cord compression (HCC)    Chronic pain associated with significant psychosocial dysfunction    Constipation    COPD (chronic obstructive pulmonary disease) (HCC)    Dyspnea    Erectile dysfunction    Fibromyalgia    GERD (gastroesophageal reflux disease)    Headache    High cholesterol    History of 2019 novel coronavirus disease (COVID-19) 11/01/2021   History of transient cerebral ischemia 09/09/2010   Homicidal ideation    Hypertension    Illiterate    pt can sign his name but states that he is very limited in his reading and writing   Living accommodation issues    a.) as of 03/29/2021 --> living in camper   Lower back pain    Lumbar canal stenosis    Paralysis (HCC)    partial  use of right arm   Pneumonia    Polysubstance abuse (HCC)    a.) opioids + THC + ETOH   Schizophrenia (HCC)    Seizures (HCC) 11/2020   Stroke (HCC)    no deficits-around 2010   Tobacco use    Past Surgical History:  Procedure Laterality Date   ANTERIOR CERVICAL DECOMP/DISCECTOMY FUSION N/A 01/13/2021   Procedure: C3-5 ANTERIOR CERVICAL DECOMPRESSION/DISCECTOMY FUSION;  Surgeon: Clois Fret, MD;  Location: [facility];  Service: Neurosurgery;  Laterality: N/A;   APPLICATION OF INTRAOPERATIVE CT SCAN N/A 09/18/2023   Procedure: APPLICATION OF INTRAOPERATIVE CT SCAN;  Surgeon: Clois Fret, MD;  Location: [facility];  Service: Neurosurgery;  Laterality: N/A;   CERVICAL SPINE SURGERY     4   CLAVICLE SURGERY Left    COLONOSCOPY WITH PROPOFOL  N/A 06/29/2022   Procedure: COLONOSCOPY WITH PROPOFOL ;  Surgeon: Therisa Bi, MD;  Location: [facility] ENDOSCOPY;  Service: Gastroenterology;  Laterality: N/A;  USES MED TRANS; WILL NEED TIME ON FRIDAY, 06/24/2022   HARDWARE REMOVAL Left 04/20/2015   Procedure: HARDWARE REMOVAL left leg;  Surgeon: Kayla Pinal, MD;  Location: [facility];  Service: Orthopedics;  Laterality: Left;   KNEE SURGERY Left    NECK SURGERY     x6   POSTERIOR CERVICAL FUSION/FORAMINOTOMY N/A 09/18/2023  Procedure: POSTERIOR CERVICAL FUSION/FORAMINOTOMY LEVEL 5;  Surgeon: Clois Fret, MD;  Location: [facility];  Service: Neurosurgery;  Laterality: N/A;  C3-T1 POSTERIOR SPINAL FUSION, C3-5 POSTERIOR SPINAL DECOMPRESSION, C7-T1 LAMINOFORAMINOTOMY   SHOULDER SURGERY Left    TOTAL KNEE ARTHROPLASTY Left 04/12/2021   Procedure: TOTAL KNEE ARTHROPLASTY;  Surgeon: Leora Lynwood SAUNDERS, MD;  Location: [facility];  Service: Orthopedics;  Laterality: Left;   ULNAR NERVE TRANSPOSITION Right 11/17/2021   Procedure: SUBCUTANEOUS TRANSPOSITION OF ULNAR NERVE, RIGHT ELBOW;  Surgeon: Edie Norleen PARAS, MD;  Location: [facility];  Service: Orthopedics;  Laterality: Right;   ULNAR NERVE TRANSPOSITION  Right 10/04/2022   Procedure: ULNAR NERVE TRANSPOSITION;  Surgeon: Claudene Penne ORN, MD;  Location: [facility];  Service: Neurosurgery;  Laterality: Right;  NO BLOCK   Patient Active Problem List   Diagnosis Date Noted   Ulnar neuropathy at elbow of right upper extremity 09/27/2023   S/P cervical spinal fusion 09/18/2023   Spinal stenosis in cervical region 09/18/2023   Pseudarthrosis following spinal fusion 09/18/2023   Ulnar neuropathy of right upper extremity 10/05/2022   Hand weakness 10/05/2022   Nerve pain 10/05/2022   Adenomatous polyp of colon 06/29/2022   S/P TKR (total knee replacement) using cement, left 04/12/2021   Cervical myelopathy (HCC) 01/13/2021   Seizure (HCC) 11/26/2020   Abnormal thyroid blood test 04/06/2016   Encounter for screening colonoscopy 01/05/2016   Abnormal weight gain 01/05/2016   Chronic prescription benzodiazepine use 10/19/2015   Elevated liver enzymes 08/20/2015   Medication monitoring encounter 08/20/2015   Hypokalemia 08/20/2015   Erectile dysfunction 04/22/2015   Status post hardware removal 04/20/2015   Bipolar 2 disorder (HCC) 07/29/2014   Marijuana abuse 07/29/2014   Chronic knee pain 07/29/2014   Bipolar 1 disorder, mixed (HCC)    Drug abuse, opioid type (HCC) 06/28/2013   GERD without esophagitis 10/03/2012   Hypertension goal BP (blood pressure) < 140/90 10/03/2012   Chronic pain associated with significant psychosocial dysfunction 07/17/2012   Lumbar canal stenosis 04/09/2012   Hypercholesterolemia 01/06/2012   Constipation 11/11/2011   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/2010   Decreased motor strength 09/30/2010   Current tobacco use 09/30/2010   H/O transient cerebral ischemia 09/09/2010   Low back pain 08/30/2010   Cervical spinal cord compression (HCC) 07/22/2010   Cervical post-laminectomy syndrome 07/22/2010   Chronic obstructive pulmonary disease (HCC) 02/23/2010   ONSET DATE: 09/18/23; PSF C3-T1, decompression C3-C5 and  C7-T1, posterolateral arthrodesis from C4-C5 to C7-T1   REFERRING DIAG: R cubital tunnel syndrome; s/p cervical fusion  THERAPY DIAG:  No diagnosis found.  Rationale for Evaluation and Treatment: Rehabilitation  SUBJECTIVE:  SUBJECTIVE STATEMENT: Pt reports he had a seizure this morning.  Arrived with abrasions on R side of face from rug burn during the seizure.  Pt reports that Dr. Lane is aware of pt's recent seizures and he has a follow up scheduled in October. Pt reports the R arm/hand was doing a lot better after recent neck surgery, but then pt had another seizure at home that Thurs, which caused a fall to the R arm.  Pt reports since this fall, the R arm/hand have been more weak. Pt accompanied by: self  PERTINENT HISTORY:  Per chart on 09/21/23: Fahed Morten is a 53 y.o presenting with cervical pseudoarthrosis and myelopathy status post C3-4, C4-5 and C7-T1. C3-T1 PSF. C4-5 and C7-T1 arthrodesis. His intraoperative course was uncomplicated. He was admitted for pain control, therapy evaluation, and drain output monitoring. His drain  output was high for the first 2 days after surgery which required continued monitoring but decreased to an acceptable level and was removed on POD3. His pain was well controlled. He was seen by therapy and deemed appropriate for discharge home.   Per note from Dr. Claudene on 11/16/22: [name] is 2 weeks status post Ulnar nerve decompression, AIN to ulnar nerve transfer, and ulnar nerve peripheral nerve stimulator placement. Given oxycodone  on discharge from the hospital.   Per medical record, pt underwent first ulnar nerve transposition on 11/17/21 and participated in OT post surgery.  2nd ulnar nerve transposition done on 10/04/22.  Pt had nerve stimulator placed but pt reports he does not like how it feels so he does not turn it on.  Pt also with hx of 6 cervical spinal surgeries; see above for additional hx.    PRECAUTIONS: None  RED  FLAGS: None   WEIGHT BEARING RESTRICTIONS: No  PAIN:  Are you having pain? Yes: NPRS scale: 4/10 R arm extending from the elbow to the hand; 6/10 all over from seizure this morning Pain location: ulnar side of R arm extending from elbow to hand; all over. Pain description: pins and needles, occasional spasms, numbness, aching Aggravating factors: cold temperatures irritate the R arm Relieving factors: heat, rest, meds  LIVING ENVIRONMENT: Lives with: alone in a camper with 1 dog.  Camper is on the property of his brother/step dad's home.    PLOF: Independent, worked as a Scientist, water quality  PATIENT GOALS: build up the strength in the R arm and hand  NEXT MD VISIT: Tues 10/31/23 post op with Dr. Clois  OBJECTIVE:  Note: Objective measures were completed at Evaluation unless otherwise noted.  HAND DOMINANCE: Right  ADLs: Overall ADLs: Pt reports he's now having to rely on the L arm again for most everything Transfers/ambulation related to ADLs: indep Eating: gross grasp in R hand to hold utensils, difficulty cutting food  Grooming: uses L non-dominant hand to shave  UB Dressing: difficulty with clothing fasteners LB Dressing: difficulty with clothing fasteners, extra time to tie shoe laces  Toileting: uses L non-dominant hand for toilet hygiene Bathing: mostly using the L non-dominant to wash  FUNCTIONAL OUTCOME MEASURES: TBD  UPPER EXTREMITY ROM:     Active ROM Left Eval prior episode on 11/21/23 Right Eval on 10/26/23  Shoulder flexion    Shoulder abduction    Shoulder adduction    Shoulder extension    Shoulder internal rotation    Shoulder external rotation    Elbow flexion    Elbow extension    Wrist flexion    Wrist extension 65   Wrist ulnar deviation WNL -45 (9)  Wrist radial deviation WNL   Wrist pronation 90   Wrist supination 75 61  (Blank rows = not tested)  07/26/23: R hand 4th and 5th digits lacking ~50% of full active digit ext at the PIP joints,  IF lacking ~25% of full ext at the PIP joint   10/26/23:  R 2nd digit PIP ext: -55, 3rd digit PIP ext: -30, 4th digit PIP ext -55, 5th digit PIP ext -50 Able to oppose R IF with ease, 3rd and 4th with high effort, lacking 4 cm to oppose thumb to 5th digit     UPPER EXTREMITY MMT:     MMT Left eval Right 07/26/23 (OT d/c from last episode of care) Right  Eval on 10/26/23  Shoulder flexion     Shoulder abduction  Shoulder adduction     Shoulder extension     Shoulder internal rotation     Shoulder external rotation     Middle trapezius     Lower trapezius     Elbow flexion 5  5  Elbow extension 5  5  Wrist flexion 5 4+ 4-  Wrist extension 5 4+ (slight radial dev) 4- (with radial deviation)  Wrist ulnar deviation 5 2 2    Wrist radial deviation 5 5 5   Wrist pronation 5 5 4+  Wrist supination 5 5 4+ (within available range)  (Blank rows = not tested)  HAND FUNCTION: 07/26/23: R grip 40 lbs, R lateral pinch: R: 2 lbs, R 3 point pinch: 0 lbs  10/26/23: R grip 8 lbs, R lateral pinch: R: 2 lbs, R 3 point pinch: 2 lbs (DIPs flex)  COORDINATION: Eval on 11/21/23: 9 Hole Peg test: Right: 2 min 39 sec; Left: 26 sec (D/c) 07/26/23: R 53 sec   10/26/23: able to place 2 pegs in 48 sec and then had to stop d/t high pain levels in R hand (9-10/10 pain)   SENSATION: 10/26/23: Light touch: Impaired: numbness R ulnar nerve distribution   EDEMA: No visible edema, but presents with R dorsal forearm abrasions from seizure this morning (rug burn)  COGNITION: Overall cognitive status: Within functional limits for tasks assessed Areas of impairment: hx of bipolar 1, schizophrenia, and drug and alcohol abuse  OBSERVATIONS:  Pt pleasant, cooperative, and eager to regain strength in his R dominant arm.   Note for education/handouts: Pt reports he is limited with his reading and writing ability, but states he will always ask if he doesn't understand something.  TODAY'S TREATMENT: Evaluation  completed.  PATIENT EDUCATION: Education details: OT goals/poc Person educated: Patient Education method: Explanation Education comprehension: verbalized understanding  HOME EXERCISE PROGRAM: To be initiated in follow up sessions  GOALS: Goals reviewed with patient? Yes  SHORT TERM GOALS: Target date: 12/07/23  Pt will be indep to perform HEP for improving RUE strength and coordination for daily tasks. Baseline: Eval: Needs review/to be initiated in follow up sessions Goal status: New  LONG TERM GOALS: Target date: 01/19/24   1.  Pt will increase R grip strength by 10 or more lbs to securely hold his bag with cell phone/wallet in R dominant hand. Baseline: Eval: R grip 8 lbs (pt uses L non-dominant) Goal status: New  2.  Pt will increase R lateral pinch strength by 5 or more lbs to ease ability to open drink bottles. Baseline: Eval: R 2 lbs Goal status: New  3.  Pt will tolerate manual therapy, therapeutic modalities, and exercises to decrease pain in RUE to a reported 2/10 pain or less with activity.   Baseline: Eval: Pain in RUE ranges from 4 to 10/10 with [facility] activities Goal status: New  4.  Pt will increase R hand FMC/dexterity skills to improve efficiency with clothing fasteners as noted by completion of 9 hole peg test in <1 min. Baseline: Eval: unable to complete today d/t pain; had completed in 53 sec when discharged from OT on 07/26/23 prior to recent sx) Goal status: New  5.  Pt will print/sign name with good legibility using built up pen as needed to sign documents.  Baseline: Difficulty maintaining grasp of pen; illegible with R hand  Goal status: New  6.  Pt will increase R hand strength/coordination to enable bathing with R hand.  Baseline: Compensating with L non-dominant  Goal status: New  7.  Pt will be modified ind-indep to eat with R dominant hand using built up utensil as needed.   Baseline: Eating with L non-dominant hand   Goal status:  New  ASSESSMENT:   CLINICAL IMPRESSION:   Pt is a 53 y/o male being evaluated by occupational therapy this date d/t functional decline related to recent cervical spinal fusion and R ulnar neuropathy/cubital tunnel syndrome.  Pt known to this OT from previous therapy episode which extended from Oct '24 to June of '25, and was discharged prior to recent cervical fusion d/t max rehab potential met.  Pt with hx of R cubital tunnel with ulnar nerve decompression and transposition, with implanted nerve stimulator, though pt does not turn this on.  Pt reports that his RUE was feeling better/stronger after recent spinal surgery, but then pt had a seizure that week upon discharging from hospital and reports that he bumped his R elbow during this seizure, and R arm has been more impaired since then.  Pt's goal is to resume OT to work towards improving RUE strength and coordination in the dominant arm, as he is currently compensating for most ADLs with the L.  Pt does present with decline in strength, coordination, and function as compared to d/c measurements in June.  Pt will benefit from skilled OT to address above noted deficits and work towards increasing functional use of the R dominant arm for daily tasks.    PERFORMANCE DEFICITS: in functional skills including ADLs, IADLs, coordination, dexterity, sensation, edema, ROM, strength, pain, fascial restrictions, muscle spasms, flexibility, Fine motor control, body mechanics, decreased knowledge of use of DME, skin integrity, and UE functional use, and psychosocial skills including coping strategies, environmental adaptation, habits, and routines and behaviors.   IMPAIRMENTS: are limiting patient from ADLs, IADLs, rest and sleep, work, and leisure.   COMORBIDITIES: has co-morbidities such as hx of 6 cervical spinal surgeries, bipolar 1, schizophrenia that affects occupational performance. Patient will benefit from skilled OT to address above impairments and improve  overall function.  MODIFICATION OR ASSISTANCE TO COMPLETE EVALUATION: No modification of tasks or assist necessary to complete an evaluation.  OT OCCUPATIONAL PROFILE AND HISTORY: Problem focused assessment: Including review of records relating to presenting problem.  CLINICAL DECISION MAKING: Moderate - several treatment options, min-mod task modification necessary  REHAB POTENTIAL: Good  EVALUATION COMPLEXITY: High    PLAN:  OT FREQUENCY: 1-2x/week  OT DURATION: 12 weeks  PLANNED INTERVENTIONS: self care/ADL training, therapeutic exercise, therapeutic activity, neuromuscular re-education, manual therapy, scar mobilization, passive range of motion, splinting, electrical stimulation, paraffin, moist heat, cryotherapy, contrast bath, patient/family education, cognitive remediation/compensation, psychosocial skills training, coping strategies training, and DME and/or AE instructions  RECOMMENDED OTHER SERVICES: PT for neck  CONSULTED AND AGREED WITH PLAN OF CARE: Patient  PLAN FOR NEXT SESSION: see above  Inocente Blazing, MS, OTR/L  Inocente MARLA Blazing, OT 10/19/2023, 8:19 AM

## 2023-10-30 ENCOUNTER — Ambulatory Visit: Payer: MEDICAID | Admitting: Physical Therapy

## 2023-10-30 DIAGNOSIS — R278 Other lack of coordination: Secondary | ICD-10-CM

## 2023-10-30 DIAGNOSIS — R2681 Unsteadiness on feet: Secondary | ICD-10-CM

## 2023-10-30 DIAGNOSIS — M542 Cervicalgia: Secondary | ICD-10-CM | POA: Diagnosis not present

## 2023-10-30 DIAGNOSIS — Z981 Arthrodesis status: Secondary | ICD-10-CM

## 2023-10-30 DIAGNOSIS — R262 Difficulty in walking, not elsewhere classified: Secondary | ICD-10-CM

## 2023-10-30 DIAGNOSIS — M6281 Muscle weakness (generalized): Secondary | ICD-10-CM
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
## 2023-10-31 ENCOUNTER — Ambulatory Visit
Admission: RE | Admit: 2023-10-31 | Discharge: 2023-10-31 | Disposition: A | Discharge status: Home / Self Care | Payer: MEDICAID | Source: Ambulatory Visit | Attending: Neurosurgery | Admitting: Neurosurgery

## 2023-10-31 ENCOUNTER — Ambulatory Visit (INDEPENDENT_AMBULATORY_CARE_PROVIDER_SITE_OTHER): Payer: MEDICAID | Admitting: Neurosurgery

## 2023-10-31 ENCOUNTER — Ambulatory Visit
Admission: RE | Admit: 2023-10-31 | Discharge: 2023-10-31 | Disposition: A | Discharge status: Home / Self Care | Payer: MEDICAID | Attending: Neurosurgery | Admitting: Neurosurgery

## 2023-10-31 VITALS — BP 118/76 | Temp 98.6°F | Ht 73.0 in | Wt 227.5 lb

## 2023-10-31 DIAGNOSIS — M4802 Spinal stenosis, cervical region: Secondary | ICD-10-CM

## 2023-10-31 DIAGNOSIS — G959 Disease of spinal cord, unspecified: Secondary | ICD-10-CM

## 2023-10-31 DIAGNOSIS — Z981 Arthrodesis status: Secondary | ICD-10-CM

## 2023-10-31 NOTE — Progress Notes (Signed)
   REFERRING PHYSICIAN:  Bernardo Sharyle Has [address] [place],  [place] [address]  DOS: 09/18/23  PSF C3-T1, decompression C3-C5 and C7-T1, posterolateral arthrodesis from C4-C5 to C7-T1  HISTORY OF PRESENT ILLNESS: Scott Howell is status post above surgery.   He is doing well.  PHYSICAL EXAMINATION:  NEUROLOGICAL:  General: In no acute distress.   Awake, alert, oriented to person, place, and time.  Pupils equal round and reactive to light.  Facial tone is symmetric.    Strength: Side Biceps Triceps Deltoid Interossei Grip Wrist Ext. Wrist Flex.  R 5 5 5 3 3 3  4-  L 5 5 5 5 5 5 5    Side Iliopsoas Quads Hamstring PF DF EHL  R 5 5 5 5 5 5   L 5 5 5 5 5 5    Incision c/d/I   Imaging:  No complications noted.  Assessment / Plan: Scott Howell is doing well.  I encouraged him to discontinue using nicotine  products.  We reviewed his activity limitations.  Will arrange a follow-up with Dr. Wylie to discuss his forearm  Reeves Daisy MD Dept of Neurosurgery

## 2023-11-01 ENCOUNTER — Encounter: Payer: MEDICAID | Admitting: Physical Therapy

## 2023-11-01 ENCOUNTER — Other Ambulatory Visit: Payer: Self-pay | Admitting: Physician Assistant

## 2023-11-01 ENCOUNTER — Telehealth: Payer: Self-pay | Admitting: Neurosurgery

## 2023-11-01 DIAGNOSIS — M960 Pseudarthrosis after fusion or arthrodesis: Secondary | ICD-10-CM

## 2023-11-01 DIAGNOSIS — Z981 Arthrodesis status: Secondary | ICD-10-CM

## 2023-11-01 MED ORDER — OXYCODONE HCL 5 MG PO TABS: 5.0000 mg | ORAL_TABLET | Freq: Three times a day (TID) | ORAL | 0 refills | Status: DC | PRN

## 2023-11-01 NOTE — Telephone Encounter (Signed)
 Patient notified and expressed understanding.

## 2023-11-01 NOTE — Telephone Encounter (Signed)
 Patient is calling to request a refill of Oxycodone  5mg  be sent to [facility] in [place].

## 2023-11-02 ENCOUNTER — Ambulatory Visit: Payer: MEDICAID

## 2023-11-02 DIAGNOSIS — M542 Cervicalgia: Secondary | ICD-10-CM | POA: Diagnosis not present

## 2023-11-02 DIAGNOSIS — Z981 Arthrodesis status: Secondary | ICD-10-CM

## 2023-11-02 DIAGNOSIS — G5621 Lesion of ulnar nerve, right upper limb: Secondary | ICD-10-CM

## 2023-11-02 DIAGNOSIS — R278 Other lack of coordination: Secondary | ICD-10-CM

## 2023-11-02 DIAGNOSIS — M6281 Muscle weakness (generalized): Secondary | ICD-10-CM

## 2023-11-04 NOTE — Therapy (Signed)
 OUTPATIENT OCCUPATIONAL THERAPY ORTHO EVALUATION  Patient Name: Scott Howell MRN: [id number] DOB:Dec 18, 1970, 53 y.o., male Today's Date: 10/26/2023  PCP: Dr. Sharyle Fischer  REFERRING PROVIDER: Glade Boys, PA-C  Dr. Clois (Neuro surgeon- Performed pt's cervical fusions) Dr. Penne Sharps (Neuro surgeon- Performed pt's R ulnar nerve transposition) Dr. Lane (Neurologist treating pt's seizures)  END OF SESSION:  OT End of Session - 11/04/23 1555     Visit Number 2    Number of Visits 24    Date for Recertification  01/19/24    Authorization Time Period Reporting period beginning 10/26/23    Progress Note Due on Visit 10    OT Start Time 0845    OT Stop Time 0930    OT Time Calculation (min) 45 min    Equipment Utilized During Treatment None    Activity Tolerance Patient tolerated treatment well    Behavior During Therapy WFL for tasks assessed/performed         Past Medical History:  Diagnosis Date   Adenomatous polyp of colon    Arthritis    Asthma    Benign fibroma of prostate    Bipolar 1 disorder (HCC)    Cerebellar stroke (subacute) 11/25/2020   Cerebral microvascular disease    Cervical myelopathy (HCC)    Cervical post-laminectomy syndrome    Cervical spinal cord compression (HCC)    Chronic pain associated with significant psychosocial dysfunction    Constipation    COPD (chronic obstructive pulmonary disease) (HCC)    Dyspnea    Erectile dysfunction    Fibromyalgia    GERD (gastroesophageal reflux disease)    Headache    High cholesterol    History of 2019 novel coronavirus disease (COVID-19) 11/01/2021   History of transient cerebral ischemia 09/09/2010   Homicidal ideation    Hypertension    Illiterate    pt can sign his name but states that he is very limited in his reading and writing   Living accommodation issues    a.) as of 03/29/2021 --> living in camper   Lower back pain    Lumbar canal stenosis    Paralysis (HCC)    partial  use of right arm   Pneumonia    Polysubstance abuse (HCC)    a.) opioids + THC + ETOH   Schizophrenia (HCC)    Seizures (HCC) 11/2020   Stroke (HCC)    no deficits-around 2010   Tobacco use    Past Surgical History:  Procedure Laterality Date   ANTERIOR CERVICAL DECOMP/DISCECTOMY FUSION N/A 01/13/2021   Procedure: C3-5 ANTERIOR CERVICAL DECOMPRESSION/DISCECTOMY FUSION;  Surgeon: Clois Fret, MD;  Location: [facility]S;  Service: Neurosurgery;  Laterality: N/A;   APPLICATION OF INTRAOPERATIVE CT SCAN N/A 09/18/2023   Procedure: APPLICATION OF INTRAOPERATIVE CT SCAN;  Surgeon: Clois Fret, MD;  Location: [facility];  Service: Neurosurgery;  Laterality: N/A;   CERVICAL SPINE SURGERY     4   CLAVICLE SURGERY Left    COLONOSCOPY WITH PROPOFOL  N/A 06/29/2022   Procedure: COLONOSCOPY WITH PROPOFOL ;  Surgeon: Therisa Bi, MD;  Location: [facility] ENDOSCOPY;  Service: Gastroenterology;  Laterality: N/A;  USES MED TRANS; WILL NEED TIME ON FRIDAY, 06/24/2022   HARDWARE REMOVAL Left 04/20/2015   Procedure: HARDWARE REMOVAL left leg;  Surgeon: Kayla Pinal, MD;  Location: [facility]S;  Service: Orthopedics;  Laterality: Left;   KNEE SURGERY Left    NECK SURGERY     x6   POSTERIOR CERVICAL FUSION/FORAMINOTOMY N/A 09/18/2023   Procedure:  POSTERIOR CERVICAL FUSION/FORAMINOTOMY LEVEL 5;  Surgeon: Clois Fret, MD;  Location: [facility];  Service: Neurosurgery;  Laterality: N/A;  C3-T1 POSTERIOR SPINAL FUSION, C3-5 POSTERIOR SPINAL DECOMPRESSION, C7-T1 LAMINOFORAMINOTOMY   SHOULDER SURGERY Left    TOTAL KNEE ARTHROPLASTY Left 04/12/2021   Procedure: TOTAL KNEE ARTHROPLASTY;  Surgeon: Leora Lynwood SAUNDERS, MD;  Location: [facility];  Service: Orthopedics;  Laterality: Left;   ULNAR NERVE TRANSPOSITION Right 11/17/2021   Procedure: SUBCUTANEOUS TRANSPOSITION OF ULNAR NERVE, RIGHT ELBOW;  Surgeon: Edie Norleen PARAS, MD;  Location: [facility];  Service: Orthopedics;  Laterality: Right;   ULNAR NERVE TRANSPOSITION  Right 10/04/2022   Procedure: ULNAR NERVE TRANSPOSITION;  Surgeon: Claudene Penne ORN, MD;  Location: [facility];  Service: Neurosurgery;  Laterality: Right;  NO BLOCK   Patient Active Problem List   Diagnosis Date Noted   Ulnar neuropathy at elbow of right upper extremity 09/27/2023   S/P cervical spinal fusion 09/18/2023   Spinal stenosis in cervical region 09/18/2023   Pseudarthrosis following spinal fusion 09/18/2023   Ulnar neuropathy of right upper extremity 10/05/2022   Hand weakness 10/05/2022   Nerve pain 10/05/2022   Adenomatous polyp of colon 06/29/2022   S/P TKR (total knee replacement) using cement, left 04/12/2021   Cervical myelopathy (HCC) 01/13/2021   Seizure (HCC) 11/26/2020   Abnormal thyroid blood test 04/06/2016   Encounter for screening colonoscopy 01/05/2016   Abnormal weight gain 01/05/2016   Chronic prescription benzodiazepine use 10/19/2015   Elevated liver enzymes 08/20/2015   Medication monitoring encounter 08/20/2015   Hypokalemia 08/20/2015   Erectile dysfunction 04/22/2015   Status post hardware removal 04/20/2015   Bipolar 2 disorder (HCC) 07/29/2014   Marijuana abuse 07/29/2014   Chronic knee pain 07/29/2014   Bipolar 1 disorder, mixed (HCC)    Drug abuse, opioid type (HCC) 06/28/2013   GERD without esophagitis 10/03/2012   Hypertension goal BP (blood pressure) < 140/90 10/03/2012   Chronic pain associated with significant psychosocial dysfunction 07/17/2012   Lumbar canal stenosis 04/09/2012   Hypercholesterolemia 01/06/2012   Constipation 11/11/2011   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/2010   Decreased motor strength 09/30/2010   Current tobacco use 09/30/2010   H/O transient cerebral ischemia 09/09/2010   Low back pain 08/30/2010   Cervical spinal cord compression (HCC) 07/22/2010   Cervical post-laminectomy syndrome 07/22/2010   Chronic obstructive pulmonary disease (HCC) 02/23/2010   ONSET DATE: 09/18/23; PSF C3-T1, decompression C3-C5 and  C7-T1, posterolateral arthrodesis from C4-C5 to C7-T1   REFERRING DIAG: R cubital tunnel syndrome; s/p cervical fusion  THERAPY DIAG:  No diagnosis found.  Rationale for Evaluation and Treatment: Rehabilitation  SUBJECTIVE:  SUBJECTIVE STATEMENT: Pt reports he had a good follow up visit with Dr. Clois and his lifting limit has been increased to 25#.  Pt is scheduled to have a follow up with Dr. Claudene for his R elbow on Wednesday of next week.  Pt accompanied by: self  PERTINENT HISTORY:  Per chart on 09/21/23: Swanson Farnell is a 53 y.o presenting with cervical pseudoarthrosis and myelopathy status post C3-4, C4-5 and C7-T1. C3-T1 PSF. C4-5 and C7-T1 arthrodesis. His intraoperative course was uncomplicated. He was admitted for pain control, therapy evaluation, and drain output monitoring. His drain output was high for the first 2 days after surgery which required continued monitoring but decreased to an acceptable level and was removed on POD3. His pain was well controlled. He was seen by therapy and deemed appropriate for discharge home.   Per note from Dr.  Smith on 11/16/22: [name] is 2 weeks status post Ulnar nerve decompression, AIN to ulnar nerve transfer, and ulnar nerve peripheral nerve stimulator placement. Given oxycodone  on discharge from the hospital.   Per medical record, pt underwent first ulnar nerve transposition on 11/17/21 and participated in OT post surgery.  2nd ulnar nerve transposition done on 10/04/22.  Pt had nerve stimulator placed but pt reports he does not like how it feels so he does not turn it on.  Pt also with hx of 6 cervical spinal surgeries; see above for additional hx.    PRECAUTIONS: None  RED FLAGS: None   WEIGHT BEARING RESTRICTIONS: No  PAIN: R: 4-6/10 pain RUE Eval: Are you having pain? Yes: NPRS scale: 4/10 R arm extending from the elbow to the hand; 6/10 all over from seizure this morning Pain location: ulnar side of R arm extending  from elbow to hand; all over. Pain description: pins and needles, occasional spasms, numbness, aching Aggravating factors: cold temperatures irritate the R arm Relieving factors: heat, rest, meds  LIVING ENVIRONMENT: Lives with: alone in a camper with 1 dog.  Camper is on the property of his brother/step dad's home.    PLOF: Independent, worked as a Scientist, water quality  PATIENT GOALS: build up the strength in the R arm and hand  NEXT MD VISIT: Tues 10/31/23 post op with Dr. Clois  OBJECTIVE:  Note: Objective measures were completed at Evaluation unless otherwise noted.  HAND DOMINANCE: Right  ADLs: Overall ADLs: Pt reports he's now having to rely on the L arm again for most everything Transfers/ambulation related to ADLs: indep Eating: gross grasp in R hand to hold utensils, difficulty cutting food  Grooming: uses L non-dominant hand to shave  UB Dressing: difficulty with clothing fasteners LB Dressing: difficulty with clothing fasteners, extra time to tie shoe laces  Toileting: uses L non-dominant hand for toilet hygiene Bathing: mostly using the L non-dominant to wash  FUNCTIONAL OUTCOME MEASURES: TBD  UPPER EXTREMITY ROM:     Active ROM Left Eval prior episode on 11/21/23 Right Eval on 10/26/23  Shoulder flexion    Shoulder abduction    Shoulder adduction    Shoulder extension    Shoulder internal rotation    Shoulder external rotation    Elbow flexion    Elbow extension    Wrist flexion    Wrist extension 65   Wrist ulnar deviation WNL -45 (9)  Wrist radial deviation WNL   Wrist pronation 90   Wrist supination 75 61  (Blank rows = not tested)  07/26/23: R hand 4th and 5th digits lacking ~50% of full active digit ext at the PIP joints, IF lacking ~25% of full ext at the PIP joint   10/26/23:  R 2nd digit PIP ext: -55, 3rd digit PIP ext: -30, 4th digit PIP ext -55, 5th digit PIP ext -50 Able to oppose R IF with ease, 3rd and 4th with high effort, lacking 4 cm to  oppose thumb to 5th digit     UPPER EXTREMITY MMT:     MMT Left eval Right 07/26/23 (OT d/c from last episode of care) Right  Eval on 10/26/23  Shoulder flexion     Shoulder abduction     Shoulder adduction     Shoulder extension     Shoulder internal rotation     Shoulder external rotation     Middle trapezius     Lower trapezius     Elbow flexion 5  5  Elbow extension 5  5  Wrist flexion 5 4+ 4-  Wrist extension 5 4+ (slight radial dev) 4- (with radial deviation)  Wrist ulnar deviation 5 2 2    Wrist radial deviation 5 5 5   Wrist pronation 5 5 4+  Wrist supination 5 5 4+ (within available range)  (Blank rows = not tested)  HAND FUNCTION: 07/26/23: R grip 40 lbs, R lateral pinch: R: 2 lbs, R 3 point pinch: 0 lbs  10/26/23: R grip 8 lbs, R lateral pinch: R: 2 lbs, R 3 point pinch: 2 lbs (DIPs flex)  COORDINATION: Eval on 11/21/23: 9 Hole Peg test: Right: 2 min 39 sec; Left: 26 sec (D/c) 07/26/23: R 53 sec   10/26/23: able to place 2 pegs in 48 sec and then had to stop d/t high pain levels in R hand (9-10/10 pain)   SENSATION: 10/26/23: Light touch: Impaired: numbness R ulnar nerve distribution   EDEMA: No visible edema, but presents with R dorsal forearm abrasions from seizure this morning (rug burn)  COGNITION: Overall cognitive status: Within functional limits for tasks assessed Areas of impairment: hx of bipolar 1, schizophrenia, and drug and alcohol abuse  OBSERVATIONS:  Pt pleasant, cooperative, and eager to regain strength in his R dominant arm.   Note for education/handouts: Pt reports he is limited with his reading and writing ability, but states he will always ask if he doesn't understand something.  TODAY'S TREATMENT: 11/02/23:  Therapeutic Exercise: -R grip strengthening: Hand gripper used to remove jumbo pegs from pegboard x3 trials: Trial 1: 6.6#; Trial 2: 11.2#; Trial 3: 6.6#; min tactile cues to maintain R 5th finger positioning on hand gripper -R pinch  strengthening with yellow and red light resistance clothespins for lateral and 3 point pinch; min vc and demo for prehension patterns; practiced 1st and 3rd digit opposition with same resistance -AAROM for R RD/UD gravity eliminated position x3 sets 10 reps each -Active R wrist flex/ext/pron/sup with 1# weight x3 sets 10 reps each; min guard to prevent RD during flex/ext   Therapeutic Activity: -Facilitated R 3 point pinch strengthening simultaneous to alternating forearm pron/sup to remove yellow and red clothespins from a horiz dowel; min vc to engage tip of R 3rd digit into prehension pattern  PATIENT EDUCATION: Education details: RUE strengthening Person educated: Patient Education method: Explanation, demo Education comprehension: verbalized understanding  HOME EXERCISE PROGRAM: To be initiated in follow up sessions  GOALS: Goals reviewed with patient? Yes  SHORT TERM GOALS: Target date: 12/07/23  Pt will be indep to perform HEP for improving RUE strength and coordination for daily tasks. Baseline: Eval: Needs review/to be initiated in follow up sessions Goal status: New  LONG TERM GOALS: Target date: 01/19/24   1.  Pt will increase R grip strength by 10 or more lbs to securely hold his bag with cell phone/wallet in R dominant hand. Baseline: Eval: R grip 8 lbs (pt uses L non-dominant) Goal status: New  2.  Pt will increase R lateral pinch strength by 5 or more lbs to ease ability to open drink bottles. Baseline: Eval: R 2 lbs Goal status: New  3.  Pt will tolerate manual therapy, therapeutic modalities, and exercises to decrease pain in RUE to a reported 2/10 pain or less with activity.   Baseline: Eval: Pain in RUE ranges from 4 to 10/10 with [facility] activities Goal status: New  4.  Pt will increase R hand FMC/dexterity skills to improve efficiency with clothing fasteners as  noted by completion of 9 hole peg test in <1 min. Baseline: Eval: unable to complete today d/t pain;  had completed in 53 sec when discharged from OT on 07/26/23 prior to recent sx) Goal status: New  5.  Pt will print/sign name with good legibility using built up pen as needed to sign documents.  Baseline: Difficulty maintaining grasp of pen; illegible with R hand  Goal status: New  6.  Pt will increase R hand strength/coordination to enable bathing with R hand.  Baseline: Compensating with L non-dominant  Goal status: New   7.  Pt will be modified ind-indep to eat with R dominant hand using built up utensil as needed.   Baseline: Eating with L non-dominant hand   Goal status: New  ASSESSMENT:   CLINICAL IMPRESSION:   Pt reports good follow up visit with Dr. Clois this week for his neck, with lifting restriction increased to 25#.  Pt will have follow up visit with Dr. Claudene for his R elbow next week.  Pt participated in all RUE strengthening to tolerance this date.  Ulnar wrist and forearm remain very weak, with pt tolerating 1 set of hand gripper at 11.2#, 2 sets at 6.6# of resistance, and yellow and red mild resistance clothespins today to focus on lateral and 3 point pinching.  R 5th digit tends to slip off hand gripper, requiring min A to reposition.  Pt struggled to engage R 3rd fingertip into 3 point pinch patterns, but was able to correct with effort.  Pt will continue to benefit from skilled OT to address above noted deficits and work towards increasing functional use of the R dominant arm for daily tasks.    PERFORMANCE DEFICITS: in functional skills including ADLs, IADLs, coordination, dexterity, sensation, edema, ROM, strength, pain, fascial restrictions, muscle spasms, flexibility, Fine motor control, body mechanics, decreased knowledge of use of DME, skin integrity, and UE functional use, and psychosocial skills including coping strategies, environmental adaptation, habits, and routines and behaviors.   IMPAIRMENTS: are limiting patient from ADLs, IADLs, rest and sleep, work, and  leisure.   COMORBIDITIES: has co-morbidities such as hx of 6 cervical spinal surgeries, bipolar 1, schizophrenia that affects occupational performance. Patient will benefit from skilled OT to address above impairments and improve overall function.  MODIFICATION OR ASSISTANCE TO COMPLETE EVALUATION: No modification of tasks or assist necessary to complete an evaluation.  OT OCCUPATIONAL PROFILE AND HISTORY: Problem focused assessment: Including review of records relating to presenting problem.  CLINICAL DECISION MAKING: Moderate - several treatment options, min-mod task modification necessary  REHAB POTENTIAL: Good  EVALUATION COMPLEXITY: High    PLAN:  OT FREQUENCY: 1-2x/week  OT DURATION: 12 weeks  PLANNED INTERVENTIONS: self care/ADL training, therapeutic exercise, therapeutic activity, neuromuscular re-education, manual therapy, scar mobilization, passive range of motion, splinting, electrical stimulation, paraffin, moist heat, cryotherapy, contrast bath, patient/family education, cognitive remediation/compensation, psychosocial skills training, coping strategies training, and DME and/or AE instructions  RECOMMENDED OTHER SERVICES: PT for neck  CONSULTED AND AGREED WITH PLAN OF CARE: Patient  PLAN FOR NEXT SESSION: see above  Inocente Blazing, MS, OTR/L  Inocente MARLA Blazing, OT 10/19/2023, 8:19 AM

## 2023-11-06 ENCOUNTER — Encounter: Payer: MEDICAID | Admitting: Physical Therapy

## 2023-11-07 ENCOUNTER — Ambulatory Visit: Payer: MEDICAID | Admitting: Physical Therapy

## 2023-11-08 ENCOUNTER — Ambulatory Visit (INDEPENDENT_AMBULATORY_CARE_PROVIDER_SITE_OTHER): Payer: MEDICAID | Admitting: Neurosurgery

## 2023-11-08 ENCOUNTER — Encounter: Payer: Self-pay | Admitting: Neurosurgery

## 2023-11-08 ENCOUNTER — Encounter: Payer: MEDICAID | Admitting: Physical Therapy

## 2023-11-08 VITALS — BP 116/78 | Ht 73.0 in | Wt 227.0 lb

## 2023-11-08 DIAGNOSIS — Z981 Arthrodesis status: Secondary | ICD-10-CM | POA: Diagnosis not present

## 2023-11-08 DIAGNOSIS — S5401XS Injury of ulnar nerve at forearm level, right arm, sequela: Secondary | ICD-10-CM

## 2023-11-08 DIAGNOSIS — S5401XA Injury of ulnar nerve at forearm level, right arm, initial encounter: Secondary | ICD-10-CM | POA: Diagnosis not present

## 2023-11-08 DIAGNOSIS — M960 Pseudarthrosis after fusion or arthrodesis: Secondary | ICD-10-CM | POA: Insufficient documentation

## 2023-11-08 MED ORDER — OXYCODONE HCL 5 MG PO TABS: 5.0000 mg | ORAL_TABLET | Freq: Three times a day (TID) | ORAL | 0 refills | Status: DC | PRN

## 2023-11-08 NOTE — Progress Notes (Signed)
   REFERRING PHYSICIAN:  Bernardo Sharyle Has [address] [place],  [place] [address]  DOS: 09/18/23  PSF C3-T1, decompression C3-C5 and C7-T1, posterolateral arthrodesis from C4-C5 to C7-T1  HISTORY OF PRESENT ILLNESS: Scott Howell is status post above surgery.  He did well from his posterior cervical decompression and fusion.  Unfortunately he had a seizure in his perioperative time and was found down for an unknown period of time on his right arm.  Developed a severe worsening of his right ulnar issues.  PHYSICAL EXAMINATION:  NEUROLOGICAL:  General: In no acute distress.   Awake, alert, oriented to person, place, and time.  Pupils equal round and reactive to light.  Facial tone is symmetric.    Strength: Worsened ulnar clawing noted.  He has some intrinsic function noted in his 4th and 3rd digit, no clear intrinsic function in his fifth digit.  Severe sensory loss in his ulnar distribution.  Severe Tinel at the right elbow.   Assessment / Plan: [name] is doing well in regards to his cervical surgery.  He has had significant improvement.  Unfortunately he suffered a seizure approximately 6 weeks ago where he was found down for an unknown period of time.  Woke up with severe ulnar nerve pain and worsened weakness in his ulnar nerve distribution.  This is likely a compressive/ischemic type etiology.  He has not had a significant improvement in his ulnar intrinsic musculature.  Will continue to follow.  I will plan to get a new EMG nerve conduction study to evaluate for any signs of reinnervation.  We have made the referral today.  Scott LELON Sharps, MD Dept of Neurosurgery

## 2023-11-09 ENCOUNTER — Ambulatory Visit: Payer: MEDICAID

## 2023-11-09 DIAGNOSIS — G5621 Lesion of ulnar nerve, right upper limb: Secondary | ICD-10-CM

## 2023-11-09 DIAGNOSIS — R278 Other lack of coordination: Secondary | ICD-10-CM

## 2023-11-09 DIAGNOSIS — M542 Cervicalgia: Secondary | ICD-10-CM | POA: Diagnosis not present

## 2023-11-09 DIAGNOSIS — M6281 Muscle weakness (generalized): Secondary | ICD-10-CM
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
## 2023-11-13 ENCOUNTER — Encounter: Payer: MEDICAID | Admitting: Physical Therapy

## 2023-11-14 ENCOUNTER — Ambulatory Visit: Payer: MEDICAID | Admitting: Physical Therapy

## 2023-11-14 DIAGNOSIS — M6281 Muscle weakness (generalized): Secondary | ICD-10-CM

## 2023-11-14 DIAGNOSIS — R278 Other lack of coordination: Secondary | ICD-10-CM

## 2023-11-14 DIAGNOSIS — M542 Cervicalgia: Secondary | ICD-10-CM | POA: Diagnosis not present

## 2023-11-14 DIAGNOSIS — R262 Difficulty in walking, not elsewhere classified: Secondary | ICD-10-CM

## 2023-11-14 DIAGNOSIS — Z981 Arthrodesis status: Secondary | ICD-10-CM

## 2023-11-14 DIAGNOSIS — R2681 Unsteadiness on feet: Secondary | ICD-10-CM

## 2023-11-14 NOTE — Therapy (Signed)
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 transposition in 2023 and 2024 are also affecting patient's functional outcome.   REHAB POTENTIAL: Good  CLINICAL DECISION MAKING: Evolving/moderate complexity  EVALUATION COMPLEXITY: Moderate  PLAN:  PT FREQUENCY: 1-2x/week  PT DURATION: 12 weeks  PLANNED INTERVENTIONS: 97164- PT Re-evaluation, 97750- Physical Performance Testing, 97110-Therapeutic exercises, 97530- Therapeutic activity, 97112- Neuromuscular re-education, 97535- Self Care, 02859- Manual therapy, [phone]- Gait training, [phone]- Orthotic/Prosthetic subsequent, [phone]- Canalith repositioning, [id number]- Electrical stimulation (unattended), [phone]- Electrical stimulation (manual), 20560 (1-2  muscles), 20561 (3+ muscles)- Dry Needling, Patient/Family education, Balance training, Stair training, Joint mobilization, Spinal mobilization, Scar mobilization, Vestibular training, Cryotherapy, Moist heat, and Biofeedback  PLAN FOR NEXT SESSION:  - manual therapy of cervical spine -thoracic mobility - focus on extension -periscapular strengthening -cervical AROM/stretching, progression to strengthening as appropriate -chin tucks* - isometrics; trial supine chin tucks -dynamic balance    Chiquita Silvan, SPT Physical Therapy Student - [place]  [facility] 9:49 AM 11/14/23

## 2023-11-15 ENCOUNTER — Other Ambulatory Visit: Payer: Self-pay | Admitting: Physician Assistant

## 2023-11-15 ENCOUNTER — Telehealth: Payer: Self-pay | Admitting: Neurosurgery

## 2023-11-15 DIAGNOSIS — Z981 Arthrodesis status: Secondary | ICD-10-CM

## 2023-11-15 DIAGNOSIS — M960 Pseudarthrosis after fusion or arthrodesis: Secondary | ICD-10-CM

## 2023-11-15 MED ORDER — OXYCODONE HCL 5 MG PO TABS: 5.0000 mg | ORAL_TABLET | Freq: Three times a day (TID) | ORAL | 0 refills | Status: DC | PRN

## 2023-11-15 NOTE — Telephone Encounter (Signed)
 Date10/1/25 Provider [facility] Requesting Oxycodone  5 mg  Pharmacy [facility] in [place] Patient contact [phone]

## 2023-11-16 ENCOUNTER — Ambulatory Visit: Payer: MEDICAID | Attending: Neurosurgery

## 2023-11-16 DIAGNOSIS — M542 Cervicalgia: Secondary | ICD-10-CM | POA: Insufficient documentation

## 2023-11-16 DIAGNOSIS — M25641 Stiffness of right hand, not elsewhere classified: Secondary | ICD-10-CM | POA: Insufficient documentation

## 2023-11-16 DIAGNOSIS — R262 Difficulty in walking, not elsewhere classified: Secondary | ICD-10-CM | POA: Insufficient documentation

## 2023-11-16 DIAGNOSIS — M6281 Muscle weakness (generalized): Secondary | ICD-10-CM | POA: Diagnosis present

## 2023-11-16 DIAGNOSIS — G5621 Lesion of ulnar nerve, right upper limb: Secondary | ICD-10-CM | POA: Insufficient documentation

## 2023-11-16 DIAGNOSIS — R278 Other lack of coordination: Secondary | ICD-10-CM | POA: Diagnosis present

## 2023-11-16 DIAGNOSIS — Z981 Arthrodesis status: Secondary | ICD-10-CM | POA: Diagnosis present

## 2023-11-16 DIAGNOSIS — R2681 Unsteadiness on feet: Secondary | ICD-10-CM | POA: Insufficient documentation
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
## 2023-11-20 ENCOUNTER — Encounter: Payer: MEDICAID | Admitting: Physical Therapy

## 2023-11-21 ENCOUNTER — Encounter: Payer: MEDICAID | Admitting: Occupational Therapy

## 2023-11-21 ENCOUNTER — Ambulatory Visit: Payer: MEDICAID | Admitting: Physical Therapy

## 2023-11-21 ENCOUNTER — Encounter: Payer: MEDICAID | Admitting: Physical Therapy

## 2023-11-21 DIAGNOSIS — M542 Cervicalgia: Secondary | ICD-10-CM

## 2023-11-21 DIAGNOSIS — R278 Other lack of coordination: Secondary | ICD-10-CM

## 2023-11-21 DIAGNOSIS — R262 Difficulty in walking, not elsewhere classified: Secondary | ICD-10-CM

## 2023-11-21 DIAGNOSIS — R2681 Unsteadiness on feet: Secondary | ICD-10-CM

## 2023-11-21 DIAGNOSIS — M6281 Muscle weakness (generalized): Secondary | ICD-10-CM

## 2023-11-21 DIAGNOSIS — Z981 Arthrodesis status: Secondary | ICD-10-CM

## 2023-11-21 NOTE — Therapy (Signed)
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 to strengthening as appropriate -chin tucks* - isometrics; trial supine chin tucks -dynamic balance -Continue to discuss POC    Chiquita Silvan, SPT Physical Therapy Student - [place]  [facility] 2:32 PM 11/21/23

## 2023-11-22 ENCOUNTER — Telehealth: Payer: Self-pay | Admitting: Neurosurgery

## 2023-11-22 ENCOUNTER — Encounter: Payer: MEDICAID | Admitting: Physical Therapy

## 2023-11-22 ENCOUNTER — Other Ambulatory Visit: Payer: Self-pay | Admitting: Physician Assistant

## 2023-11-22 DIAGNOSIS — Z981 Arthrodesis status: Secondary | ICD-10-CM

## 2023-11-22 DIAGNOSIS — M960 Pseudarthrosis after fusion or arthrodesis: Secondary | ICD-10-CM

## 2023-11-22 MED ORDER — OXYCODONE HCL 5 MG PO TABS: 5.0000 mg | ORAL_TABLET | Freq: Three times a day (TID) | ORAL | 0 refills | Status: DC | PRN

## 2023-11-22 NOTE — Telephone Encounter (Signed)
 Patient notified and expressed understanding.

## 2023-11-22 NOTE — Telephone Encounter (Signed)
 Patient is calling to request a refill of Oxycodone  be sent to [facility] in [place].

## 2023-11-23 ENCOUNTER — Ambulatory Visit: Payer: MEDICAID

## 2023-11-23 DIAGNOSIS — Z981 Arthrodesis status: Secondary | ICD-10-CM

## 2023-11-23 DIAGNOSIS — R278 Other lack of coordination: Secondary | ICD-10-CM

## 2023-11-23 DIAGNOSIS — M6281 Muscle weakness (generalized): Secondary | ICD-10-CM | POA: Diagnosis not present

## 2023-11-23 DIAGNOSIS — G5621 Lesion of ulnar nerve, right upper limb: Secondary | ICD-10-CM
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
## 2023-11-27 ENCOUNTER — Encounter: Payer: Self-pay | Admitting: Neurosurgery

## 2023-11-27 ENCOUNTER — Encounter: Payer: MEDICAID | Admitting: Physical Therapy

## 2023-11-27 ENCOUNTER — Ambulatory Visit (INDEPENDENT_AMBULATORY_CARE_PROVIDER_SITE_OTHER): Payer: MEDICAID | Admitting: Neurosurgery

## 2023-11-27 VITALS — BP 124/84 | Wt 225.2 lb

## 2023-11-27 DIAGNOSIS — G5621 Lesion of ulnar nerve, right upper limb: Secondary | ICD-10-CM | POA: Diagnosis not present

## 2023-11-27 NOTE — Progress Notes (Signed)
   REFERRING PHYSICIAN:  Bernardo Sharyle Has [address] [place],  [place] [address]  DOS: 09/18/23  PSF C3-T1, decompression C3-C5 and C7-T1, posterolateral arthrodesis from C4-C5 to C7-T1  HISTORY OF PRESENT ILLNESS: Scott Howell is status post above surgery.  He did well from his posterior cervical decompression and fusion.Unfortunately he had a seizure in his perioperative time and was found down for an unknown period of time on his right arm.  He developed severe worsening of his right sided ulnar neuropathy with worsening clawing.  PHYSICAL EXAMINATION:  NEUROLOGICAL:  General: In no acute distress.   Awake, alert, oriented to person, place, and time.  Pupils equal round and reactive to light.  Facial tone is symmetric.    Strength: Ulnar coloring appears to have stabilized.  He has some intrinsic function noted in his 4th and 3rd digit, no clear intrinsic function in his fifth digit.  Severe sensory loss in his ulnar distribution.  Tinel has improved somewhat   Assessment / Plan: Scott Howell is doing well in regards to his cervical surgery.  He has had significant improvement.  Unfortunately he suffered a seizure and was down for upwards of 2 to 3 days on his arm.  He woke up with severe ulnar nerve pain and loss of function in his hand.  Were planning to follow-up after EMG nerve conduction study.    Penne LELON Sharps, MD Dept of Neurosurgery

## 2023-11-28 ENCOUNTER — Other Ambulatory Visit: Payer: Self-pay

## 2023-11-28 ENCOUNTER — Encounter: Payer: MEDICAID | Admitting: Orthopedic Surgery

## 2023-11-28 ENCOUNTER — Encounter: Payer: MEDICAID | Admitting: Occupational Therapy

## 2023-11-28 ENCOUNTER — Ambulatory Visit: Payer: MEDICAID | Admitting: Physical Therapy

## 2023-11-28 DIAGNOSIS — M6281 Muscle weakness (generalized): Secondary | ICD-10-CM

## 2023-11-28 DIAGNOSIS — R278 Other lack of coordination: Secondary | ICD-10-CM

## 2023-11-28 DIAGNOSIS — Z981 Arthrodesis status: Secondary | ICD-10-CM

## 2023-11-28 DIAGNOSIS — M4802 Spinal stenosis, cervical region: Secondary | ICD-10-CM

## 2023-11-28 DIAGNOSIS — G959 Disease of spinal cord, unspecified: Secondary | ICD-10-CM

## 2023-11-28 DIAGNOSIS — M542 Cervicalgia: Secondary | ICD-10-CM

## 2023-11-28 DIAGNOSIS — R2681 Unsteadiness on feet: Secondary | ICD-10-CM

## 2023-11-28 DIAGNOSIS — R262 Difficulty in walking, not elsewhere classified: Secondary | ICD-10-CM

## 2023-11-28 NOTE — Therapy (Signed)
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 cervical spine -thoracic mobility - focus on extension -periscapular strengthening: progress rows, ER; progress to in standing?  -cervical AROM/stretching, progression to strengthening as appropriate -chin tucks* - isometrics; trial supine chin tucks -dynamic balance -Continue to discuss POC    Chiquita Silvan, SPT Physical Therapy Student - [place]  [facility] 1:11 PM 11/28/23

## 2023-11-29 ENCOUNTER — Other Ambulatory Visit: Payer: Self-pay | Admitting: Family Medicine

## 2023-11-29 ENCOUNTER — Encounter: Payer: MEDICAID | Admitting: Physical Therapy

## 2023-11-29 ENCOUNTER — Other Ambulatory Visit: Payer: Self-pay | Admitting: Neurosurgery

## 2023-11-29 DIAGNOSIS — Z981 Arthrodesis status: Secondary | ICD-10-CM

## 2023-11-29 DIAGNOSIS — M960 Pseudarthrosis after fusion or arthrodesis: Secondary | ICD-10-CM

## 2023-11-29 MED ORDER — OXYCODONE HCL 5 MG PO TABS: 5.0000 mg | ORAL_TABLET | Freq: Three times a day (TID) | ORAL | 0 refills | Status: AC | PRN

## 2023-11-29 MED ORDER — TIZANIDINE HCL 4 MG PO TABS: 4.0000 mg | ORAL_TABLET | Freq: Three times a day (TID) | ORAL | 0 refills | Status: AC

## 2023-11-29 NOTE — Telephone Encounter (Signed)
 Patient is requesting a refill of the oxycodone  and Tizanidine .

## 2023-11-30 ENCOUNTER — Ambulatory Visit: Payer: MEDICAID

## 2023-11-30 DIAGNOSIS — R278 Other lack of coordination: Secondary | ICD-10-CM

## 2023-11-30 DIAGNOSIS — M6281 Muscle weakness (generalized): Secondary | ICD-10-CM | POA: Diagnosis not present

## 2023-11-30 DIAGNOSIS — G5621 Lesion of ulnar nerve, right upper limb: Secondary | ICD-10-CM

## 2023-11-30 DIAGNOSIS — M25641 Stiffness of right hand, not elsewhere classified: Secondary | ICD-10-CM
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
## 2023-12-01 ENCOUNTER — Ambulatory Visit: Payer: MEDICAID | Admitting: Internal Medicine

## 2023-12-01 ENCOUNTER — Encounter: Payer: Self-pay | Admitting: Internal Medicine

## 2023-12-01 VITALS — BP 122/84 | HR 92 | Temp 98.1°F | Resp 16 | Ht 73.0 in | Wt 226.3 lb

## 2023-12-01 DIAGNOSIS — Z23 Encounter for immunization: Secondary | ICD-10-CM

## 2023-12-01 DIAGNOSIS — J449 Chronic obstructive pulmonary disease, unspecified: Secondary | ICD-10-CM

## 2023-12-01 DIAGNOSIS — E7800 Pure hypercholesterolemia, unspecified: Secondary | ICD-10-CM

## 2023-12-01 DIAGNOSIS — I10 Essential (primary) hypertension: Secondary | ICD-10-CM

## 2023-12-01 DIAGNOSIS — K219 Gastro-esophageal reflux disease without esophagitis: Secondary | ICD-10-CM

## 2023-12-01 DIAGNOSIS — R569 Unspecified convulsions: Secondary | ICD-10-CM

## 2023-12-01 MED ORDER — FAMOTIDINE 40 MG PO TABS: 40.0000 mg | ORAL_TABLET | Freq: Every day | ORAL | 1 refills | Status: AC

## 2023-12-01 MED ORDER — ATORVASTATIN CALCIUM 40 MG PO TABS: 40.0000 mg | ORAL_TABLET | Freq: Every day | ORAL | 1 refills | Status: AC

## 2023-12-01 MED ORDER — LISINOPRIL 10 MG PO TABS: 10.0000 mg | ORAL_TABLET | Freq: Every day | ORAL | 1 refills | Status: AC

## 2023-12-01 NOTE — Progress Notes (Signed)
 Established Patient Office Visit  Subjective:  Patient ID: Scott Howell, male    DOB: [birth year]  Age: 53 y.o. MRN: [id number]  CC:  Chief Complaint  Patient presents with   Medical Management of Chronic Issues    HPI Scott Howell presents for follow up on chronic medical conditions.   Discussed the use of AI scribe software for clinical note transcription with the patient, who gave verbal consent to proceed.  History of Present Illness Scott Howell is a 53 year old male who presents for a follow-up after neck surgery and increased seizure activity.  He underwent neck surgery on August 4th, with a four-day hospitalization. Post-surgery, he experiences increased seizure activity, with some seizures being predictable. He takes Depakote  250 mg twice daily. A significant seizure led to an emergency room visit, where low medication levels were noted despite adherence. He previously fell and injured his elbow during a seizure.  He reports severe pain when walking, described as intense and radiating from the lower back to the spine, present for a couple of days. He is undergoing rehabilitation therapy for his neck and arm.  He is on Seroquel , currently at 500 mg due to a delay in order processing, with a planned increase to 600 mg. He also takes lisinopril , cholesterol medication, and Pepcid , with refills needed. He has good control with inhalers and has reduced smoking, now using a nicotine -free vapor device.  Seizures:  -Now following with Neurology -Currently on Depakote  250 mg BID and Seroquel  increased to 500 mg -Having more seizures since neck surgery in August.   Hypertension: -Medications: Lisinopril  10 mg -Checking BP at home (average): No -Denies any SOB, CP, vision changes, LE edema or symptoms of hypotension  HLD: -Medications: Lipitor 40 mg  -Patient is compliant with above medications and reports no side effects.  -Last lipid panel:  Lipid Panel      Component Value Date/Time   CHOL 119 06/01/2023 1323   TRIG 113 06/01/2023 1323   HDL 41 06/01/2023 1323   CHOLHDL 2.9 06/01/2023 1323   VLDL 22 11/26/2020 0157   LDLCALC 58 06/01/2023 1323    COPD: -COPD status: stable -Current medications: Symbicort  daily, Duonebs and Albuterol  PRN -Satisfied with current treatment: yes -Oxygen use: no -Dyspnea frequency: shortness of breath occasionally, non-exertional  -Cough frequency: smoker's cough daily, white mucus -Rescue inhaler frequency: Currently using Albtuerol once-twice a day  -Limitation of activity:  sometimes -Productive cough: yes -Pneumovax: Up to date -Influenza: Up to Date   Bipolar Disorder: Currently on Seroquel  500-600 mg at bedtime.    Health Maintenance: -Blood work due -Colon cancer screening: colonoscopy 5/24, repeat in 3 years -Flu vaccine today  Past Medical History:  Diagnosis Date   Adenomatous polyp of colon    Arthritis    Asthma    Benign fibroma of prostate    Bipolar 1 disorder (HCC)    Cerebellar stroke (subacute) 11/25/2020   Cerebral microvascular disease    Cervical myelopathy (HCC)    Cervical post-laminectomy syndrome    Cervical spinal cord compression (HCC)    Chronic pain associated with significant psychosocial dysfunction    Constipation    COPD (chronic obstructive pulmonary disease) (HCC)    Dyspnea    Erectile dysfunction    Fibromyalgia    GERD (gastroesophageal reflux disease)    Headache    High cholesterol    History of 2019 novel coronavirus disease (COVID-19) 11/01/2021   History of transient cerebral ischemia 09/09/2010  Homicidal ideation    Hypertension    Illiterate    pt can sign his name but states that he is very limited in his reading and writing   Living accommodation issues    a.) as of 03/29/2021 --> living in camper   Lower back pain    Lumbar canal stenosis    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   Pneumonia    Polysubstance abuse (HCC)     a.) opioids + THC + ETOH   Schizophrenia (HCC)    Seizures (HCC) 11/2020   Stroke (HCC)    no deficits-around 2010   Tobacco use     Past Surgical History:  Procedure Laterality Date   ANTERIOR CERVICAL DECOMP/DISCECTOMY FUSION N/A 01/13/2021   Procedure: C3-5 ANTERIOR CERVICAL DECOMPRESSION/DISCECTOMY FUSION;  Surgeon: Clois Fret, MD;  Location: [facility];  Service: Neurosurgery;  Laterality: N/A;   APPLICATION OF INTRAOPERATIVE CT SCAN N/A 09/18/2023   Procedure: APPLICATION OF INTRAOPERATIVE CT SCAN;  Surgeon: Clois Fret, MD;  Location: [facility];  Service: Neurosurgery;  Laterality: N/A;   CERVICAL SPINE SURGERY     4   CLAVICLE SURGERY Left    COLONOSCOPY WITH PROPOFOL  N/A 06/29/2022   Procedure: COLONOSCOPY WITH PROPOFOL ;  Surgeon: Therisa Bi, MD;  Location: [facility];  Service: Gastroenterology;  Laterality: N/A;  USES MED TRANS; WILL NEED TIME ON FRIDAY, 06/24/2022   HARDWARE REMOVAL Left 04/20/2015   Procedure: HARDWARE REMOVAL left leg;  Surgeon: Kayla Pinal, MD;  Location: [facility];  Service: Orthopedics;  Laterality: Left;   KNEE SURGERY Left    NECK SURGERY     x6   POSTERIOR CERVICAL FUSION/FORAMINOTOMY N/A 09/18/2023   Procedure: POSTERIOR CERVICAL FUSION/FORAMINOTOMY LEVEL 5;  Surgeon: Clois Fret, MD;  Location: [facility];  Service: Neurosurgery;  Laterality: N/A;  C3-T1 POSTERIOR SPINAL FUSION, C3-5 POSTERIOR SPINAL DECOMPRESSION, C7-T1 LAMINOFORAMINOTOMY   SHOULDER SURGERY Left    TOTAL KNEE ARTHROPLASTY Left 04/12/2021   Procedure: TOTAL KNEE ARTHROPLASTY;  Surgeon: Leora Lynwood SAUNDERS, MD;  Location: [facility];  Service: Orthopedics;  Laterality: Left;   ULNAR NERVE TRANSPOSITION Right 11/17/2021   Procedure: SUBCUTANEOUS TRANSPOSITION OF ULNAR NERVE, RIGHT ELBOW;  Surgeon: Edie Norleen PARAS, MD;  Location: [facility];  Service: Orthopedics;  Laterality: Right;   ULNAR NERVE TRANSPOSITION Right 10/04/2022   Procedure: ULNAR NERVE TRANSPOSITION;   Surgeon: Claudene Penne ORN, MD;  Location: [facility];  Service: Neurosurgery;  Laterality: Right;  NO BLOCK    Family History  Problem Relation Age of Onset   Diabetes Mother    Hyperlipidemia Mother    Hypertension Mother    Diabetes Maternal Aunt    Cancer Maternal Aunt    Hyperlipidemia Maternal Aunt    Hypertension Maternal Aunt    Diabetes Maternal Uncle    Cancer Maternal Uncle    Hyperlipidemia Maternal Uncle    Hypertension Maternal Uncle     Social History   Socioeconomic History   Marital status: Single    Spouse name: Not on file   Number of children: Not on file   Years of education: Not on file   Highest education level: Not on file  Occupational History   Not on file  Tobacco Use   Smoking status: Every Day    Current packs/day: 1.50    Average packs/day: 1.5 packs/day for 30.0 years (45.0 ttl pk-yrs)    Types: Cigarettes   Smokeless tobacco: Never   Tobacco comments:    5  CIGARETTES A DAY  Vaping Use   Vaping status: Never Used  Substance and Sexual Activity   Alcohol use: No    Comment: Sober for 10 yrs.   Drug use: Yes    Frequency: 7.0 times per week    Types: Marijuana   Sexual activity: Never  Other Topics Concern   Not on file  Social History Narrative   Not on file   Social Drivers of Health   Financial Resource Strain: Not on file  Food Insecurity: No Food Insecurity (09/19/2023)   Hunger Vital Sign    Worried About Running Out of Food in the Last Year: Never true    Ran Out of Food in the Last Year: Never true  Transportation Needs: No Transportation Needs (09/19/2023)   PRAPARE - Administrator, Civil Service (Medical): No    Lack of Transportation (Non-Medical): No  Physical Activity: Not on file  Stress: Not on file  Social Connections: Socially Isolated (09/19/2023)   Social Connection and Isolation Panel    Frequency of Communication with Friends and Family: Never    Frequency of Social Gatherings with Friends and  Family: Never    Attends Religious Services: 1 to 4 times per year    Active Member of [employer] or Organizations: No    Attends Banker Meetings: Never    Marital Status: Never married  Intimate Partner Violence: Not At Risk (09/19/2023)   Humiliation, Afraid, Rape, and Kick questionnaire    Fear of Current or Ex-Partner: No    Emotionally Abused: No    Physically Abused: No    Sexually Abused: No    Outpatient Medications Prior to Visit  Medication Sig Dispense Refill   albuterol  (VENTOLIN  HFA) 108 (90 Base) MCG/ACT inhaler INHALE 2 PUFFS INTO LUNGS EVERY 6 HOURS AS NEEDED FOR WHEEZING 18 each 2   atorvastatin  (LIPITOR) 40 MG tablet Take 1 tablet (40 mg total) by mouth daily. 90 tablet 1   clonazePAM (KLONOPIN) 1 MG tablet Take 1 mg by mouth 2 (two) times daily as needed.     divalproex  (DEPAKOTE ) 250 MG DR tablet Take 1 tablet (250 mg total) by mouth 3 (three) times daily. 90 tablet 1   DULoxetine  (CYMBALTA ) 30 MG capsule Take 30 mg by mouth 2 (two) times daily.     famotidine  (PEPCID ) 40 MG tablet Take 1 tablet (40 mg total) by mouth daily. 90 tablet 1   gabapentin  (NEURONTIN ) 300 MG capsule Take 3 capsules (900 mg total) by mouth 3 (three) times daily. 270 capsule 3   ipratropium-albuterol  (DUONEB) 0.5-2.5 (3) MG/3ML SOLN Take 3 mLs by nebulization 2 (two) times daily. 360 mL 1   lisinopril  (ZESTRIL ) 10 MG tablet Take 1 tablet (10 mg total) by mouth daily. 90 tablet 1   oxyCODONE  (OXY IR/ROXICODONE ) 5 MG immediate release tablet Take 1 tablet (5 mg total) by mouth every 8 (eight) hours as needed for up to 7 days for severe pain (pain score 7-10). 21 tablet 0   QUEtiapine  (SEROQUEL ) 200 MG tablet Take 500 mg by mouth at bedtime.     SYMBICORT  160-4.5 MCG/ACT inhaler INHALE 2 PUFFS INTO THE LUNGS TWICE A DAY 10.2 each 3   tiZANidine  (ZANAFLEX ) 4 MG tablet Take 1 tablet (4 mg total) by mouth 3 (three) times daily. 90 tablet 0   No facility-administered medications prior to  visit.    Allergies  Allergen Reactions   Acetaminophen  Other (See Comments)    Pt  states that it makes him hyper    ROS Review of Systems  Neurological:  Positive for seizures.      Objective:    Physical Exam Constitutional:      Appearance: Normal appearance.  HENT:     Head: Normocephalic and atraumatic.  Eyes:     Conjunctiva/sclera: Conjunctivae normal.  Cardiovascular:     Rate and Rhythm: Normal rate and regular rhythm.  Pulmonary:     Effort: Pulmonary effort is normal.     Breath sounds: Normal breath sounds.  Skin:    General: Skin is warm and dry.  Neurological:     General: No focal deficit present.     Mental Status: He is alert. Mental status is at baseline.  Psychiatric:        Mood and Affect: Mood normal.        Behavior: Behavior normal.     BP 122/84 (Cuff Size: Large)   Pulse 92   Temp 98.1 F (36.7 C) (Oral)   Resp 16   Ht 6' 1 (1.854 m)   Wt 226 lb 4.8 oz (102.6 kg)   SpO2 98%   BMI 29.86 kg/m  Wt Readings from Last 3 Encounters:  12/01/23 226 lb 4.8 oz (102.6 kg)  11/27/23 225 lb 3.2 oz (102.2 kg)  11/08/23 227 lb (103 kg)     Health Maintenance Due  Topic Date Due   Hepatitis B Vaccines 19-59 Average Risk (1 of 3 - 19+ 3-dose series) Never done   Zoster Vaccines- Shingrix (1 of 2) Never done   COVID-19 Vaccine (3 - Moderna risk series) 07/18/2019       Topic Date Due   Hepatitis B Vaccines 19-59 Average Risk (1 of 3 - 19+ 3-dose series) Never done    Lab Results  Component Value Date   TSH 0.70 12/16/2020   Lab Results  Component Value Date   WBC 10.5 09/27/2023   HGB 11.4 (L) 09/27/2023   HCT 33.2 (L) 09/27/2023   MCV 88.5 09/27/2023   PLT 342 09/27/2023   Lab Results  Component Value Date   NA 133 (L) 09/27/2023   K 3.6 09/27/2023   CO2 21 (L) 09/27/2023   GLUCOSE 109 (H) 09/27/2023   BUN 7 09/27/2023   CREATININE 0.72 09/27/2023   BILITOT 0.6 07/09/2023   ALKPHOS 41 07/09/2023   AST 19 07/09/2023    ALT 14 07/09/2023   PROT 7.2 07/09/2023   ALBUMIN 4.1 07/09/2023   CALCIUM  8.7 (L) 09/27/2023   ANIONGAP 11 09/27/2023   EGFR 104 06/08/2022   Lab Results  Component Value Date   CHOL 119 06/01/2023   Lab Results  Component Value Date   HDL 41 06/01/2023   Lab Results  Component Value Date   LDLCALC 58 06/01/2023   Lab Results  Component Value Date   TRIG 113 06/01/2023   Lab Results  Component Value Date   CHOLHDL 2.9 06/01/2023   Lab Results  Component Value Date   HGBA1C 5.8 (H) 11/26/2020      Assessment & Plan:   Assessment & Plan Postoperative follow-up for cervical spine surgery Increased seizures and sweating episodes post-surgery. Undergoing rehabilitation for neck and arm. - Continue rehabilitation for neck and arm. - Follow up with neurology for further evaluation of seizures.  Seizure disorder Increased frequency of seizures since cervical spine surgery. Currently on Depakote  250 mg twice daily. Medication levels reportedly low despite adherence. - Follow up with neurology for potential  adjustment of Depakote . - Consider EEG for further evaluation of seizures. - Repeat labs to check medication levels.  Hypertension Due for refill of lisinopril . - Send refill of lisinopril .  Hyperlipidemia Due for refill of cholesterol medication. - Send refill for cholesterol medication to [facility] in [place].  GERD Symptoms stable, refill Pepcid  40 mg.  COPD Symptoms stable, doing well with current inhaler regimen.   General Health Maintenance Flu shot administered. Interested in shingles vaccine. Smoking cessation progress with reduction to three cigarettes a day and use of nicotine -free vaping. - Obtain shingles vaccine at the pharmacy.  - lisinopril  (ZESTRIL ) 10 MG tablet; Take 1 tablet (10 mg total) by mouth daily.  Dispense: 90 tablet; Refill: 1 - Comprehensive Metabolic Panel (CMET) - CBC w/Diff/Platelet - atorvastatin  (LIPITOR) 40 MG tablet; Take 1  tablet (40 mg total) by mouth daily.  Dispense: 90 tablet; Refill: 1 - famotidine  (PEPCID ) 40 MG tablet; Take 1 tablet (40 mg total) by mouth daily.  Dispense: 90 tablet; Refill: 1 - Valproic acid  level - Flu vaccine trivalent PF, 6mos and older(Flulaval,Afluria,Fluarix,Fluzone)   Follow-up: Return in about 6 months (around 05/31/2024).    Sharyle Fischer, DO

## 2023-12-02 LAB — CBC WITH DIFFERENTIAL/PLATELET
Absolute Lymphocytes: 2262 {cells}/uL (ref 850–3900)
Absolute Monocytes: 636 {cells}/uL (ref 200–950)
Basophils Absolute: 30 {cells}/uL (ref 0–200)
Basophils Relative: 0.3 %
Eosinophils Absolute: 101 {cells}/uL (ref 15–500)
Eosinophils Relative: 1 %
HCT: 38.4 % — ABNORMAL LOW (ref 38.5–50.0)
Hemoglobin: 13 g/dL — ABNORMAL LOW (ref 13.2–17.1)
MCH: 30.4 pg (ref 27.0–33.0)
MCHC: 33.9 g/dL (ref 32.0–36.0)
MCV: 89.7 fL (ref 80.0–100.0)
MPV: 9.8 fL (ref 7.5–12.5)
Monocytes Relative: 6.3 %
Neutro Abs: 7070 {cells}/uL (ref 1500–7800)
Neutrophils Relative %: 70 %
Platelets: 270 Thousand/uL (ref 140–400)
RBC: 4.28 Million/uL (ref 4.20–5.80)
RDW: 13.7 % (ref 11.0–15.0)
Total Lymphocyte: 22.4 %
WBC: 10.1 Thousand/uL (ref 3.8–10.8)

## 2023-12-02 LAB — COMPREHENSIVE METABOLIC PANEL WITH GFR
AG Ratio: 1.7 (calc) (ref 1.0–2.5)
ALT: 8 U/L — ABNORMAL LOW (ref 9–46)
AST: 11 U/L (ref 10–35)
Albumin: 4.3 g/dL (ref 3.6–5.1)
Alkaline phosphatase (APISO): 52 U/L (ref 35–144)
BUN/Creatinine Ratio: 4 (calc) — ABNORMAL LOW (ref 6–22)
BUN: 3 mg/dL — ABNORMAL LOW (ref 7–25)
CO2: 28 mmol/L (ref 20–32)
Calcium: 9.4 mg/dL (ref 8.6–10.3)
Chloride: 98 mmol/L (ref 98–110)
Creat: 0.68 mg/dL — ABNORMAL LOW (ref 0.70–1.30)
Globulin: 2.5 g/dL (ref 1.9–3.7)
Glucose, Bld: 76 mg/dL (ref 65–99)
Potassium: 4.6 mmol/L (ref 3.5–5.3)
Sodium: 131 mmol/L — ABNORMAL LOW (ref 135–146)
Total Bilirubin: 0.3 mg/dL (ref 0.2–1.2)
Total Protein: 6.8 g/dL (ref 6.1–8.1)
eGFR: 111 mL/min/1.73m2 (ref >=60)

## 2023-12-04 ENCOUNTER — Encounter: Payer: MEDICAID | Admitting: Physical Therapy

## 2023-12-05 ENCOUNTER — Encounter: Payer: MEDICAID | Admitting: Occupational Therapy

## 2023-12-05 ENCOUNTER — Encounter: Payer: MEDICAID | Admitting: Physical Therapy

## 2023-12-05 NOTE — Progress Notes (Unsigned)
   REFERRING PHYSICIAN:  Bernardo Howell Has [address] [place],  [place] [address]  DOS: 09/18/23  PSF C3-T1, decompression C3-C5 and C7-T1, posterolateral arthrodesis from C4-C5 to C7-T1  HISTORY OF PRESENT ILLNESS:  He was doing well at his last visit with Dr. Clois on 10/31/23.   He has intermittent pain in his neck that is tolerable. Overall, he is doing very well. His preop pain is gone.   He continues to follow with Dr. Claudene for his right arm.  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   PHYSICAL EXAMINATION:  NEUROLOGICAL:  General: In no acute distress.   Awake, alert, oriented to person, place, and time.  Pupils equal round and reactive to light.  Facial tone is symmetric.    Strength: Side Biceps Triceps Deltoid Interossei Grip Wrist Ext. Wrist Flex.  R 5 5 5 3 3 4 4   L 5 5 5 5 5 5 5     Incision well healed.   Imaging:  Cervical xrays dated 12/07/23:  No complications noted.  Report for above xrays not yet available.   Assessment / Plan: Scott Howell is doing well s/p above surgery. Treatment options reviewed with patient and following plan made:   - He can slowly return to activity as tolerated regarding cervical surgery.  - He will continue to follow with Dr. Claudene regarding right arm.  - Follow up with Dr. Clois in 6 months for cervical spine.   Advised to contact the office if any questions or concerns arise.   Scott Boys PA-C Dept of Neurosurgery

## 2023-12-06 ENCOUNTER — Encounter: Payer: MEDICAID | Admitting: Physical Therapy

## 2023-12-07 ENCOUNTER — Encounter: Payer: Self-pay | Admitting: Orthopedic Surgery

## 2023-12-07 ENCOUNTER — Ambulatory Visit: Payer: MEDICAID

## 2023-12-07 ENCOUNTER — Encounter: Payer: MEDICAID | Admitting: Occupational Therapy

## 2023-12-07 ENCOUNTER — Ambulatory Visit: Payer: MEDICAID | Admitting: Orthopedic Surgery

## 2023-12-07 VITALS — BP 126/78 | Temp 98.5°F | Ht 73.0 in | Wt 226.0 lb

## 2023-12-07 DIAGNOSIS — Z09 Encounter for follow-up examination after completed treatment for conditions other than malignant neoplasm: Secondary | ICD-10-CM | POA: Diagnosis not present

## 2023-12-07 DIAGNOSIS — Z981 Arthrodesis status: Secondary | ICD-10-CM

## 2023-12-07 DIAGNOSIS — M4802 Spinal stenosis, cervical region: Secondary | ICD-10-CM

## 2023-12-07 DIAGNOSIS — M960 Pseudarthrosis after fusion or arthrodesis: Secondary | ICD-10-CM

## 2023-12-07 DIAGNOSIS — G959 Disease of spinal cord, unspecified: Secondary | ICD-10-CM

## 2023-12-11 ENCOUNTER — Ambulatory Visit: Payer: Self-pay | Admitting: Internal Medicine

## 2023-12-11 ENCOUNTER — Encounter: Payer: MEDICAID | Admitting: Physical Therapy

## 2023-12-12 ENCOUNTER — Ambulatory Visit: Payer: MEDICAID | Admitting: Physical Therapy

## 2023-12-12 ENCOUNTER — Telehealth: Payer: Self-pay | Admitting: Physical Therapy
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
## 2023-12-12 NOTE — Telephone Encounter (Signed)
 Pt contacted via telephone and author left voice mail informing of missed appointment and informed pt of future PT appointment date and time.   Chiquita Silvan, SPT Physical Therapy Student - [place]  [facility]

## 2023-12-13 ENCOUNTER — Encounter: Payer: MEDICAID | Admitting: Physical Therapy

## 2023-12-14 ENCOUNTER — Ambulatory Visit: Payer: MEDICAID

## 2023-12-15 ENCOUNTER — Other Ambulatory Visit: Payer: Self-pay | Admitting: Internal Medicine

## 2023-12-15 DIAGNOSIS — J449 Chronic obstructive pulmonary disease, unspecified: Secondary | ICD-10-CM

## 2023-12-16 NOTE — Telephone Encounter (Signed)
 Requested Prescriptions  Pending Prescriptions Disp Refills   albuterol  (VENTOLIN  HFA) 108 (90 Base) MCG/ACT inhaler [Pharmacy Med Name: VENTOLIN  HFA 90 MCG INHALER] 18 each 0    Sig: INHALE 2 PUFFS INTO LUNGS EVERY 6 HOURS AS NEEDED FOR WHEEZING     Pulmonology:  Beta Agonists 2 Passed - 12/16/2023 10:32 AM      Passed - Last BP in normal range    BP Readings from Last 1 Encounters:  12/07/23 126/78         Passed - Last Heart Rate in normal range    Pulse Readings from Last 1 Encounters:  12/01/23 92         Passed - Valid encounter within last 12 months    Recent Outpatient Visits           2 weeks ago Hypertension goal BP (blood pressure) < 140/90   [facility] Bernardo Fend, DO   4 months ago Left knee pain, unspecified chronicity   [place] [facility] Bernardo Fend, DO   6 months ago Hypertension goal BP (blood pressure) < 140/90   [facility] Bernardo Fend, [place]

## 2023-12-18 ENCOUNTER — Encounter: Payer: MEDICAID | Admitting: Physical Therapy

## 2023-12-19 ENCOUNTER — Ambulatory Visit: Payer: MEDICAID | Attending: Neurosurgery | Admitting: Physical Therapy

## 2023-12-19 ENCOUNTER — Telehealth: Payer: Self-pay | Admitting: Physical Therapy

## 2023-12-19 DIAGNOSIS — M542 Cervicalgia: Secondary | ICD-10-CM | POA: Insufficient documentation

## 2023-12-19 DIAGNOSIS — R2681 Unsteadiness on feet: Secondary | ICD-10-CM | POA: Insufficient documentation

## 2023-12-19 DIAGNOSIS — M6281 Muscle weakness (generalized): Secondary | ICD-10-CM | POA: Insufficient documentation

## 2023-12-19 DIAGNOSIS — R262 Difficulty in walking, not elsewhere classified: Secondary | ICD-10-CM | POA: Insufficient documentation

## 2023-12-19 DIAGNOSIS — Z981 Arthrodesis status: Secondary | ICD-10-CM | POA: Insufficient documentation

## 2023-12-19 DIAGNOSIS — M25641 Stiffness of right hand, not elsewhere classified: Secondary | ICD-10-CM | POA: Insufficient documentation

## 2023-12-19 DIAGNOSIS — G5621 Lesion of ulnar nerve, right upper limb: Secondary | ICD-10-CM | POA: Insufficient documentation

## 2023-12-19 DIAGNOSIS — R278 Other lack of coordination: Secondary | ICD-10-CM | POA: Insufficient documentation

## 2023-12-19 NOTE — Telephone Encounter (Signed)
 Called to notify patient of missed PT appointment. Also notified patient of no show policy; as this was second no-show visit, remaining PT appointments would need to be cancelled & patient would have to schedule as he goes. Notified patient of upcoming OT appointment on 12/21/23 at 9:30 AM; patient verbalizing understanding.   Chiquita Silvan, SPT Physical Therapy Student - [place]  [facility]

## 2023-12-20 ENCOUNTER — Encounter: Payer: MEDICAID | Admitting: Physical Therapy

## 2023-12-21 ENCOUNTER — Ambulatory Visit: Payer: MEDICAID

## 2023-12-21 DIAGNOSIS — R262 Difficulty in walking, not elsewhere classified: Secondary | ICD-10-CM | POA: Diagnosis present

## 2023-12-21 DIAGNOSIS — R2681 Unsteadiness on feet: Secondary | ICD-10-CM | POA: Diagnosis present

## 2023-12-21 DIAGNOSIS — G5621 Lesion of ulnar nerve, right upper limb: Secondary | ICD-10-CM

## 2023-12-21 DIAGNOSIS — M542 Cervicalgia: Secondary | ICD-10-CM | POA: Diagnosis present

## 2023-12-21 DIAGNOSIS — M6281 Muscle weakness (generalized): Secondary | ICD-10-CM

## 2023-12-21 DIAGNOSIS — R278 Other lack of coordination: Secondary | ICD-10-CM | POA: Diagnosis present

## 2023-12-21 DIAGNOSIS — M25641 Stiffness of right hand, not elsewhere classified: Secondary | ICD-10-CM | POA: Diagnosis present

## 2023-12-21 DIAGNOSIS — Z981 Arthrodesis status: Secondary | ICD-10-CM | POA: Diagnosis present
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
## 2023-12-25 ENCOUNTER — Encounter: Payer: MEDICAID | Admitting: Physical Therapy

## 2023-12-26 ENCOUNTER — Ambulatory Visit: Payer: MEDICAID | Admitting: Physical Therapy

## 2023-12-26 DIAGNOSIS — M6281 Muscle weakness (generalized): Secondary | ICD-10-CM

## 2023-12-26 DIAGNOSIS — G5621 Lesion of ulnar nerve, right upper limb: Secondary | ICD-10-CM

## 2023-12-26 DIAGNOSIS — R2681 Unsteadiness on feet: Secondary | ICD-10-CM

## 2023-12-26 DIAGNOSIS — Z981 Arthrodesis status: Secondary | ICD-10-CM

## 2023-12-26 DIAGNOSIS — M25641 Stiffness of right hand, not elsewhere classified: Secondary | ICD-10-CM

## 2023-12-26 DIAGNOSIS — M542 Cervicalgia: Secondary | ICD-10-CM

## 2023-12-26 DIAGNOSIS — R262 Difficulty in walking, not elsewhere classified: Secondary | ICD-10-CM

## 2023-12-26 DIAGNOSIS — R278 Other lack of coordination: Secondary | ICD-10-CM

## 2023-12-26 NOTE — Therapy (Signed)
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 20561 (3+ muscles)- Dry Needling, Patient/Family education, Balance training, Stair training, Joint mobilization, Spinal mobilization, Scar mobilization, Vestibular training, Cryotherapy, Moist heat, and Biofeedback  PLAN FOR NEXT SESSION:  - manual therapy of cervical spine -thoracic mobility - focus on extension -periscapular strengthening: progress rows, ER; progress to in standing?  -cervical AROM/stretching, progression to strengthening as appropriate -chin tucks* - isometrics; trial supine chin tucks -dynamic balance -Continue to discuss POC    Massie Dollar PT, DPT  Physical Therapist - [facility]  [facility]  8:57  AM 12/26/23

## 2023-12-28 ENCOUNTER — Ambulatory Visit: Payer: MEDICAID

## 2023-12-28 DIAGNOSIS — M25641 Stiffness of right hand, not elsewhere classified: Secondary | ICD-10-CM

## 2023-12-28 DIAGNOSIS — M6281 Muscle weakness (generalized): Secondary | ICD-10-CM

## 2023-12-28 DIAGNOSIS — G5621 Lesion of ulnar nerve, right upper limb: Secondary | ICD-10-CM

## 2023-12-28 DIAGNOSIS — R278 Other lack of coordination: Secondary | ICD-10-CM
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
## 2024-01-02 ENCOUNTER — Ambulatory Visit: Payer: MEDICAID | Admitting: Physical Therapy

## 2024-01-02 DIAGNOSIS — R2681 Unsteadiness on feet: Secondary | ICD-10-CM

## 2024-01-02 DIAGNOSIS — M6281 Muscle weakness (generalized): Secondary | ICD-10-CM

## 2024-01-02 DIAGNOSIS — R278 Other lack of coordination: Secondary | ICD-10-CM

## 2024-01-02 DIAGNOSIS — R262 Difficulty in walking, not elsewhere classified: Secondary | ICD-10-CM

## 2024-01-02 DIAGNOSIS — M542 Cervicalgia: Secondary | ICD-10-CM

## 2024-01-02 NOTE — Therapy (Signed)
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 supine chin tucks -dynamic balance -Continue to discuss POC    Massie Dollar PT, DPT  Physical Therapist - [facility]  [facility]  10:20 AM 01/02/24

## 2024-01-04 ENCOUNTER — Ambulatory Visit: Payer: MEDICAID

## 2024-01-09 ENCOUNTER — Ambulatory Visit: Payer: MEDICAID | Admitting: Physical Therapy

## 2024-01-16 ENCOUNTER — Ambulatory Visit: Payer: MEDICAID | Admitting: Physical Therapy

## 2024-01-16 ENCOUNTER — Ambulatory Visit: Payer: MEDICAID | Attending: Neurosurgery | Admitting: Physical Therapy

## 2024-01-16 DIAGNOSIS — M25631 Stiffness of right wrist, not elsewhere classified: Secondary | ICD-10-CM | POA: Diagnosis present

## 2024-01-16 DIAGNOSIS — M25641 Stiffness of right hand, not elsewhere classified: Secondary | ICD-10-CM | POA: Diagnosis present

## 2024-01-16 DIAGNOSIS — R2681 Unsteadiness on feet: Secondary | ICD-10-CM | POA: Insufficient documentation

## 2024-01-16 DIAGNOSIS — R278 Other lack of coordination: Secondary | ICD-10-CM | POA: Diagnosis present

## 2024-01-16 DIAGNOSIS — Z981 Arthrodesis status: Secondary | ICD-10-CM | POA: Diagnosis present

## 2024-01-16 DIAGNOSIS — R262 Difficulty in walking, not elsewhere classified: Secondary | ICD-10-CM | POA: Diagnosis present

## 2024-01-16 DIAGNOSIS — M6281 Muscle weakness (generalized): Secondary | ICD-10-CM | POA: Insufficient documentation

## 2024-01-16 DIAGNOSIS — M542 Cervicalgia: Secondary | ICD-10-CM | POA: Diagnosis present

## 2024-01-16 DIAGNOSIS — G5621 Lesion of ulnar nerve, right upper limb: Secondary | ICD-10-CM | POA: Insufficient documentation

## 2024-01-16 NOTE — Therapy (Signed)
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 01/16/24

## 2024-01-18 ENCOUNTER — Ambulatory Visit: Payer: MEDICAID

## 2024-01-18 DIAGNOSIS — R278 Other lack of coordination: Secondary | ICD-10-CM

## 2024-01-18 DIAGNOSIS — M25631 Stiffness of right wrist, not elsewhere classified: Secondary | ICD-10-CM

## 2024-01-18 DIAGNOSIS — M6281 Muscle weakness (generalized): Secondary | ICD-10-CM | POA: Diagnosis not present

## 2024-01-18 DIAGNOSIS — G5621 Lesion of ulnar nerve, right upper limb: Secondary | ICD-10-CM
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
## 2024-01-23 ENCOUNTER — Ambulatory Visit: Payer: MEDICAID | Admitting: Physical Therapy

## 2024-01-23 ENCOUNTER — Encounter: Payer: Self-pay | Admitting: Physical Therapy

## 2024-01-23 DIAGNOSIS — R2681 Unsteadiness on feet: Secondary | ICD-10-CM

## 2024-01-23 DIAGNOSIS — M6281 Muscle weakness (generalized): Secondary | ICD-10-CM

## 2024-01-23 DIAGNOSIS — M542 Cervicalgia: Secondary | ICD-10-CM

## 2024-01-23 DIAGNOSIS — Z981 Arthrodesis status: Secondary | ICD-10-CM

## 2024-01-23 DIAGNOSIS — R262 Difficulty in walking, not elsewhere classified: Secondary | ICD-10-CM

## 2024-01-23 DIAGNOSIS — R278 Other lack of coordination: Secondary | ICD-10-CM

## 2024-01-23 NOTE — Therapy (Unsigned)
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 Goal status: IN PROGRESS   ASSESSMENT:  CLINICAL IMPRESSION:  Progress note performed at today's session, with goal reassessment of functional outcome measures. Pt shows significant neck ROM improvement, meeting his goals for all ranges except R cervical rotation and R cervical side bending, only lacking a few degrees for each of those, as well as minor pain. Pt shows improved NDI score, decreasing by 3 points since last check in, indicating the pt as having moderate disability. Pt FGA score shows a 3 point improvement since initial assessment, but still not reaching pt goal. 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  Remainder of session focused on decreasing lumbar spine pain and increasing mobility. Pt tolerated all supine exercises well, and responded well to verbal cues for form and hold duration of exercises. Utilized a timer today's session instead of repetitions in order for pt to have more focus on stretch and form of exercise. Pt reported decrease of pain in lumbar spine following today's session. Pt will continue to benefit from skilled therapy to address remaining deficits in order to improve overall QoL and return to PLOF.    OBJECTIVE IMPAIRMENTS: Abnormal gait, decreased activity tolerance, decreased mobility, decreased ROM, decreased strength, hypomobility, increased fascial restrictions, impaired flexibility, impaired UE functional use, postural dysfunction, and pain.   ACTIVITY LIMITATIONS: carrying, lifting, bending, squatting, sleeping, bathing, toileting, dressing, reach over head, hygiene/grooming, and locomotion level  PARTICIPATION LIMITATIONS: meal prep, cleaning, laundry, community activity, and occupation  PERSONAL FACTORS: Age, Education, Past/current experiences, Time since onset of injury/illness/exacerbation, and 3+ comorbidities: Arthritis, Cerebellar CVA in 2022, COPD, HTN, Low back pain, seizures, prior C3-C5 ACDF in 2022, prior L clavicle surgery, L TKA in Feb 2023, prior ulnar nerve transposition in 2023 and 2024 are also affecting patient's functional outcome.   REHAB POTENTIAL: Good  CLINICAL DECISION  MAKING: Evolving/moderate complexity  EVALUATION COMPLEXITY: Moderate  PLAN:  PT FREQUENCY: 1-2x/week  PT DURATION: 8 weeks  PLANNED INTERVENTIONS: 97164- PT Re-evaluation, 97750- Physical Performance Testing, 97110-Therapeutic exercises, 97530- Therapeutic activity, [id number]- Neuromuscular re-education, 97535- Self Care, [id number]- Manual therapy, [id number]- Gait training, [phone]- Orthotic/Prosthetic subsequent, [phone]- Canalith repositioning, H9716- Electrical stimulation  (unattended), [phone]- Electrical stimulation (manual), 20560 (1-2 muscles), 20561 (3+ muscles)- Dry Needling, Patient/Family education, Balance training, Stair training, Joint mobilization, Spinal mobilization, Scar mobilization, Vestibular training, Cryotherapy, Moist heat, and Biofeedback  PLAN FOR NEXT SESSION:  -manual therapy of cervical/lumbar spine -thoracic mobility, extension focused -progression of periscapular strengthening -cervical AROM/stretching -dynamic balance  Renna Helling, SPT 1:17 PM 01/23/24

## 2024-01-25 ENCOUNTER — Ambulatory Visit: Payer: MEDICAID

## 2024-01-25 DIAGNOSIS — R278 Other lack of coordination: Secondary | ICD-10-CM

## 2024-01-25 DIAGNOSIS — M6281 Muscle weakness (generalized): Secondary | ICD-10-CM | POA: Diagnosis not present

## 2024-01-25 DIAGNOSIS — M25631 Stiffness of right wrist, not elsewhere classified: Secondary | ICD-10-CM

## 2024-01-25 DIAGNOSIS — G5621 Lesion of ulnar nerve, right upper limb: Secondary | ICD-10-CM

## 2024-01-25 DIAGNOSIS — M25641 Stiffness of right hand, not elsewhere classified: Secondary | ICD-10-CM
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
## 2024-01-30 ENCOUNTER — Ambulatory Visit: Payer: MEDICAID | Admitting: Physical Therapy

## 2024-01-30 DIAGNOSIS — M6281 Muscle weakness (generalized): Secondary | ICD-10-CM

## 2024-01-30 DIAGNOSIS — R262 Difficulty in walking, not elsewhere classified: Secondary | ICD-10-CM

## 2024-01-30 DIAGNOSIS — M25631 Stiffness of right wrist, not elsewhere classified: Secondary | ICD-10-CM

## 2024-01-30 DIAGNOSIS — R278 Other lack of coordination: Secondary | ICD-10-CM

## 2024-01-30 DIAGNOSIS — Z981 Arthrodesis status: Secondary | ICD-10-CM

## 2024-01-30 DIAGNOSIS — R2681 Unsteadiness on feet: Secondary | ICD-10-CM

## 2024-01-30 DIAGNOSIS — M542 Cervicalgia: Secondary | ICD-10-CM

## 2024-01-30 NOTE — Therapy (Signed)
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  PLAN:  PT FREQUENCY: 1-2x/week  PT DURATION: 8 weeks  PLANNED INTERVENTIONS: 97164- PT Re-evaluation, 97750- Physical Performance Testing, 97110-Therapeutic exercises, 97530- Therapeutic activity, [id number]- Neuromuscular re-education, 97535- Self Care, [id number]- Manual therapy, [id number]- Gait training, [phone]- Orthotic/Prosthetic subsequent, [phone]- Canalith repositioning, H9716- Electrical stimulation (unattended), [phone]- Electrical stimulation (manual), 20560 (1-2 muscles), 20561 (3+ muscles)- Dry Needling, Patient/Family education, Balance training, Stair training, Joint mobilization, Spinal mobilization, Scar mobilization, Vestibular training, Cryotherapy, Moist heat, and Biofeedback  PLAN FOR NEXT SESSION:  -manual therapy of cervical/lumbar spine -thoracic mobility, extension focused -progression of periscapular  strengthening -cervical AROM/stretching -dynamic balance  Renna Helling, SPT 4:59 PM 01/30/2024

## 2024-02-06 ENCOUNTER — Ambulatory Visit: Payer: MEDICAID | Admitting: Physical Therapy

## 2024-02-14 ENCOUNTER — Ambulatory Visit: Payer: MEDICAID

## 2024-02-14 DIAGNOSIS — M6281 Muscle weakness (generalized): Secondary | ICD-10-CM

## 2024-02-14 DIAGNOSIS — M25631 Stiffness of right wrist, not elsewhere classified: Secondary | ICD-10-CM

## 2024-02-14 DIAGNOSIS — G5621 Lesion of ulnar nerve, right upper limb: Secondary | ICD-10-CM

## 2024-02-14 DIAGNOSIS — R278 Other lack of coordination: Secondary | ICD-10-CM
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
## 2024-02-16 ENCOUNTER — Ambulatory Visit: Payer: MEDICAID

## 2024-02-20 ENCOUNTER — Encounter: Payer: MEDICAID | Admitting: Physical Therapy

## 2024-02-20 ENCOUNTER — Ambulatory Visit: Payer: MEDICAID | Attending: Neurosurgery | Admitting: Physical Therapy

## 2024-02-20 DIAGNOSIS — M25631 Stiffness of right wrist, not elsewhere classified: Secondary | ICD-10-CM | POA: Insufficient documentation

## 2024-02-20 DIAGNOSIS — M6281 Muscle weakness (generalized): Secondary | ICD-10-CM | POA: Insufficient documentation

## 2024-02-20 DIAGNOSIS — R262 Difficulty in walking, not elsewhere classified: Secondary | ICD-10-CM | POA: Diagnosis present

## 2024-02-20 DIAGNOSIS — G5621 Lesion of ulnar nerve, right upper limb: Secondary | ICD-10-CM | POA: Diagnosis present

## 2024-02-20 DIAGNOSIS — R278 Other lack of coordination: Secondary | ICD-10-CM | POA: Diagnosis present

## 2024-02-20 DIAGNOSIS — M542 Cervicalgia: Secondary | ICD-10-CM | POA: Diagnosis present

## 2024-02-20 DIAGNOSIS — R2681 Unsteadiness on feet: Secondary | ICD-10-CM | POA: Insufficient documentation

## 2024-02-20 DIAGNOSIS — M25641 Stiffness of right hand, not elsewhere classified: Secondary | ICD-10-CM | POA: Diagnosis present

## 2024-02-20 DIAGNOSIS — Z981 Arthrodesis status: Secondary | ICD-10-CM | POA: Insufficient documentation

## 2024-02-20 NOTE — Therapy (Signed)
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 Electrical stimulation (unattended), [id number]- Electrical stimulation (manual), 20560 (1-2 muscles), 20561 (3+ muscles)- Dry Needling, Patient/Family education, Balance training, Stair training, Joint mobilization, Spinal mobilization, Scar mobilization, Vestibular training, Cryotherapy, Moist heat, and Biofeedback  PLAN FOR NEXT SESSION:  N/A. D/c from PT.       Massie Dollar PT, DPT  Physical Therapist - [facility]  11:42 AM 02/20/2024   "

## 2024-02-22 ENCOUNTER — Ambulatory Visit: Payer: MEDICAID

## 2024-02-22 DIAGNOSIS — M6281 Muscle weakness (generalized): Secondary | ICD-10-CM | POA: Diagnosis not present

## 2024-02-22 DIAGNOSIS — M25641 Stiffness of right hand, not elsewhere classified: Secondary | ICD-10-CM

## 2024-02-22 DIAGNOSIS — M25631 Stiffness of right wrist, not elsewhere classified: Secondary | ICD-10-CM

## 2024-02-22 DIAGNOSIS — R278 Other lack of coordination: Secondary | ICD-10-CM

## 2024-02-22 DIAGNOSIS — G5621 Lesion of ulnar nerve, right upper limb: Secondary | ICD-10-CM

## 2024-02-22 NOTE — Therapy (Unsigned)
 " OUTPATIENT OCCUPATIONAL THERAPY ORTHO TREATMENT NOTE Patient Name: Scott Howell MRN: [id number] DOB:[birth year], 54 y.o., male Today's Date: 10/26/2023  PCP: Dr. Sharyle Fischer  REFERRING PROVIDER: Glade Boys, PA-C  Dr. Clois (Neuro surgeon- Performed pt's cervical fusions) Dr. Penne Sharps (Neuro surgeon- Performed pt's R ulnar nerve transposition) Dr. Lane (Neurologist treating pt's seizures)  END OF SESSION:  OT End of Session - 02/22/24 0934     Visit Number 12    Number of Visits 24    Date for Recertification  04/18/24    Authorization Type 12 OT visits approved from 9/18-3/17/26    Authorization - Visit Number 12    Authorization - Number of Visits 12    OT Start Time 0930    OT Stop Time 1015    OT Time Calculation (min) 45 min    Equipment Utilized During Treatment None    Activity Tolerance Patient tolerated treatment well    Behavior During Therapy WFL for tasks assessed/performed         Past Medical History:  Diagnosis Date   Adenomatous polyp of colon    Arthritis    Asthma    Benign fibroma of prostate    Bipolar 1 disorder (HCC)    Cerebellar stroke (subacute) 11/25/2020   Cerebral microvascular disease    Cervical myelopathy (HCC)    Cervical post-laminectomy syndrome    Cervical spinal cord compression (HCC)    Chronic pain associated with significant psychosocial dysfunction    Constipation    COPD (chronic obstructive pulmonary disease) (HCC)    Dyspnea    Erectile dysfunction    Fibromyalgia    GERD (gastroesophageal reflux disease)    Headache    High cholesterol    History of 2019 novel coronavirus disease (COVID-19) 11/01/2021   History of transient cerebral ischemia 09/09/2010   Homicidal ideation    Hypertension    Illiterate    pt can sign his name but states that he is very limited in his reading and writing   Living accommodation issues    a.) as of 03/29/2021 --> living in camper   Lower back pain    Lumbar canal  stenosis    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   Pneumonia    Polysubstance abuse (HCC)    a.) opioids + THC + ETOH   Schizophrenia (HCC)    Seizures (HCC) 11/2020   Stroke (HCC)    no deficits-around 2010   Tobacco use    Past Surgical History:  Procedure Laterality Date   ANTERIOR CERVICAL DECOMP/DISCECTOMY FUSION N/A 01/13/2021   Procedure: C3-5 ANTERIOR CERVICAL DECOMPRESSION/DISCECTOMY FUSION;  Surgeon: Clois Fret, MD;  Location: [facility];  Service: Neurosurgery;  Laterality: N/A;   APPLICATION OF INTRAOPERATIVE CT SCAN N/A 09/18/2023   Procedure: APPLICATION OF INTRAOPERATIVE CT SCAN;  Surgeon: Clois Fret, MD;  Location: [facility];  Service: Neurosurgery;  Laterality: N/A;   CERVICAL SPINE SURGERY     4   CLAVICLE SURGERY Left    COLONOSCOPY WITH PROPOFOL  N/A 06/29/2022   Procedure: COLONOSCOPY WITH PROPOFOL ;  Surgeon: Therisa Bi, MD;  Location: [facility];  Service: Gastroenterology;  Laterality: N/A;  USES MED TRANS; WILL NEED TIME ON FRIDAY, 06/24/2022   HARDWARE REMOVAL Left 04/20/2015   Procedure: HARDWARE REMOVAL left leg;  Surgeon: Kayla Pinal, MD;  Location: [facility];  Service: Orthopedics;  Laterality: Left;   KNEE SURGERY Left    NECK SURGERY     x6  POSTERIOR CERVICAL FUSION/FORAMINOTOMY N/A 09/18/2023   Procedure: POSTERIOR CERVICAL FUSION/FORAMINOTOMY LEVEL 5;  Surgeon: Clois Fret, MD;  Location: [facility];  Service: Neurosurgery;  Laterality: N/A;  C3-T1 POSTERIOR SPINAL FUSION, C3-5 POSTERIOR SPINAL DECOMPRESSION, C7-T1 LAMINOFORAMINOTOMY   SHOULDER SURGERY Left    TOTAL KNEE ARTHROPLASTY Left 04/12/2021   Procedure: TOTAL KNEE ARTHROPLASTY;  Surgeon: Leora Lynwood SAUNDERS, MD;  Location: [facility];  Service: Orthopedics;  Laterality: Left;   ULNAR NERVE TRANSPOSITION Right 11/17/2021   Procedure: SUBCUTANEOUS TRANSPOSITION OF ULNAR NERVE, RIGHT ELBOW;  Surgeon: Edie Norleen PARAS, MD;  Location: [facility];  Service: Orthopedics;   Laterality: Right;   ULNAR NERVE TRANSPOSITION Right 10/04/2022   Procedure: ULNAR NERVE TRANSPOSITION;  Surgeon: Claudene Penne ORN, MD;  Location: [facility];  Service: Neurosurgery;  Laterality: Right;  NO BLOCK   Patient Active Problem List   Diagnosis Date Noted   Ulnar neuropathy at elbow of right upper extremity 09/27/2023   S/P cervical spinal fusion 09/18/2023   Spinal stenosis in cervical region 09/18/2023   Pseudarthrosis following spinal fusion 09/18/2023   Ulnar neuropathy of right upper extremity 10/05/2022   Hand weakness 10/05/2022   Nerve pain 10/05/2022   Adenomatous polyp of colon 06/29/2022   S/P TKR (total knee replacement) using cement, left 04/12/2021   Cervical myelopathy (HCC) 01/13/2021   Seizure (HCC) 11/26/2020   Abnormal thyroid blood test 04/06/2016   Encounter for screening colonoscopy 01/05/2016   Abnormal weight gain 01/05/2016   Chronic prescription benzodiazepine use 10/19/2015   Elevated liver enzymes 08/20/2015   Medication monitoring encounter 08/20/2015   Hypokalemia 08/20/2015   Erectile dysfunction 04/22/2015   Status post hardware removal 04/20/2015   Bipolar 2 disorder (HCC) 07/29/2014   Marijuana abuse 07/29/2014   Chronic knee pain 07/29/2014   Bipolar 1 disorder, mixed (HCC)    Drug abuse, opioid type (HCC) 06/28/2013   GERD without esophagitis 10/03/2012   Hypertension goal BP (blood pressure) < 140/90 10/03/2012   Chronic pain associated with significant psychosocial dysfunction 07/17/2012   Lumbar canal stenosis 04/09/2012   Hypercholesterolemia 01/06/2012   Constipation 11/11/2011   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/2010   Decreased motor strength 09/30/2010   Current tobacco use 09/30/2010   H/O transient cerebral ischemia 09/09/2010   Low back pain 08/30/2010   Cervical spinal cord compression (HCC) 07/22/2010   Cervical post-laminectomy syndrome 07/22/2010   Chronic obstructive pulmonary disease (HCC) 02/23/2010   ONSET  DATE: 09/18/23; PSF C3-T1, decompression C3-C5 and C7-T1, posterolateral arthrodesis from C4-C5 to C7-T1   REFERRING DIAG: R cubital tunnel syndrome; s/p cervical fusion  THERAPY DIAG:  No diagnosis found.  Rationale for Evaluation and Treatment: Rehabilitation  SUBJECTIVE:  SUBJECTIVE STATEMENT: Pt reports when it's cold his arm always hurts. Pt reports he's dropping more when using his R hand, feeling like it's getting worse over the last couple of weeks.  Pt accompanied by: self  PERTINENT HISTORY:  Per chart on 09/21/23: Harbert Fitterer is a 54 y.o presenting with cervical pseudoarthrosis and myelopathy status post C3-4, C4-5 and C7-T1. C3-T1 PSF. C4-5 and C7-T1 arthrodesis. His intraoperative course was uncomplicated. He was admitted for pain control, therapy evaluation, and drain output monitoring. His drain output was high for the first 2 days after surgery which required continued monitoring but decreased to an acceptable level and was removed on POD3. His pain was well controlled. He was seen by therapy and deemed appropriate for discharge home.   Per note from Dr. Claudene on 11/16/22: Lamar  E Fee is 2 weeks status post Ulnar nerve decompression, AIN to ulnar nerve transfer, and ulnar nerve peripheral nerve stimulator placement. Given oxycodone  on discharge from the hospital.   Per medical record, pt underwent first ulnar nerve transposition on 11/17/21 and participated in OT post surgery.  2nd ulnar nerve transposition done on 10/04/22.  Pt had nerve stimulator placed but pt reports he does not like how it feels so he does not turn it on.  Pt also with hx of 6 cervical spinal surgeries; see above for additional hx.    PRECAUTIONS: None  RED FLAGS: None   WEIGHT BEARING RESTRICTIONS: No  PAIN: 02/22/24: 5/10 R elbow to hand Eval: Are you having pain? Yes: NPRS scale: 4/10 R arm extending from the elbow to the hand; 6/10 all over from seizure this morning Pain location: ulnar side of  R arm extending from elbow to hand; all over. Pain description: pins and needles, occasional spasms, numbness, aching Aggravating factors: cold temperatures irritate the R arm Relieving factors: heat, rest, meds  LIVING ENVIRONMENT: Lives with: alone in a camper with 1 dog.  Camper is on the property of his brother/step dad's home.    PLOF: Independent, worked as a scientist, water quality  PATIENT GOALS: build up the strength in the R arm and hand  NEXT MD VISIT: Tues 10/31/23 post op with Dr. Clois  OBJECTIVE:  Note: Objective measures were completed at Evaluation unless otherwise noted.  HAND DOMINANCE: Right  ADLs: Overall ADLs: Pt reports he's now having to rely on the L arm again for most everything Transfers/ambulation related to ADLs: indep Eating: gross grasp in R hand to hold utensils, difficulty cutting food  Grooming: uses L non-dominant hand to shave  UB Dressing: difficulty with clothing fasteners LB Dressing: difficulty with clothing fasteners, extra time to tie shoe laces  Toileting: uses L non-dominant hand for toilet hygiene Bathing: mostly using the L non-dominant to wash  FUNCTIONAL OUTCOME MEASURES: TBD  UPPER EXTREMITY ROM:     Active ROM Left Eval prior episode on 11/21/23 Right Eval on 10/26/23 Right 01/25/24 Right 02/22/24  Shoulder flexion      Shoulder abduction      Shoulder adduction      Shoulder extension      Shoulder internal rotation      Shoulder external rotation      Elbow flexion      Elbow extension      Wrist flexion      Wrist extension 65   65  Wrist ulnar deviation WNL -45 (9) -10 (0) -10 (25)  Wrist radial deviation WNL   WNL  Wrist pronation 90   90  Wrist supination 75 61 80 80  (Blank rows = not tested)  07/26/23: R hand 4th and 5th digits lacking ~50% of full active digit ext at the PIP joints, IF lacking ~25% of full ext at the PIP joint   10/26/23:  R 2nd digit PIP ext: -55, 3rd digit PIP ext: -30, 4th digit PIP ext -55,  5th digit PIP ext -50 Able to oppose R IF with ease, 3rd and 4th with high effort, lacking 4 cm to oppose thumb to 5th digit   01/25/24: able to oppose all R hand digits to thumb with only mild difficulty to ulnar digits  Digit ROM measured with neutral forearm and near neutral wrist to (~20 * flexion at wrist): R 2nd digit PIP ext: -45, 3rd digit PIP ext: -20*,  4th digit  PIP ext -10*, 5th digit PIP ext -35*   02/22/24: able to oppose all R hand digits to thumb with some cramping when opposing to ulnar digits   R 2nd digit PIP ext: -55, 3rd digit PIP ext: -10*,  4th digit PIP ext -10*, 5th digit PIP ext -55*   UPPER EXTREMITY MMT:     MMT Left eval Right 07/26/23 (OT d/c from last episode of care) Right  Eval on 10/26/23 Right 01/25/24 Right 02/22/24  Shoulder flexion       Shoulder abduction       Shoulder adduction       Shoulder extension       Shoulder internal rotation       Shoulder external rotation       Middle trapezius       Lower trapezius       Elbow flexion 5  5 5 5   Elbow extension 5  5 5 5   Wrist flexion 5 4+ 4- 5 4+  Wrist extension 5 4+ (slight radial dev) 4- (with radial deviation) 4+ (with radial deviation)  4+ (with radial deviation)  Wrist ulnar deviation 5 2 2   2+ 2+  Wrist radial deviation 5 5 5 5 5   Wrist pronation 5 5 4+  5  Wrist supination 5 5 4+ (within available range) 5 (within available range) 5 (within available range)  (Blank rows = not tested)  HAND FUNCTION: 07/26/23: R grip 40 lbs, R lateral pinch: R: 2 lbs, R 3 point pinch: 0 lbs  10/26/23: R grip 8 lbs, R lateral pinch: R: 2 lbs, R 3 point pinch: 2 lbs (DIPs flex) 11/30/23: R grip 25 lbs  01/25/24: R grip 30 lbs; R lateral pinch: 4 lbs, R 3 point pinch: 3 lbs 02/22/24: R grip 26 lbs; R lateral pinch: 4 lbs, R 3 point pinch: 2 lbs   COORDINATION: Eval on 11/21/23: 9 Hole Peg test: Right: 2 min 39 sec; Left: 26 sec (D/c) 07/26/23: R 53 sec  10/26/23: able to place 2 pegs in 48 sec and then had  to stop d/t high pain levels in R hand (9-10/10 pain)  01/25/24: R 46 sec with only mild increase in RUE pain 02/22/24: R 53 sec   SENSATION: 10/26/23: Light touch: Impaired: numbness R ulnar nerve distribution   EDEMA: No visible edema, but presents with R dorsal forearm abrasions from seizure this morning (rug burn)  COGNITION: Overall cognitive status: Within functional limits for tasks assessed Areas of impairment: hx of bipolar 1, schizophrenia, and drug and alcohol abuse  OBSERVATIONS:  Pt pleasant, cooperative, and eager to regain strength in his R dominant arm.   Note for education/handouts: Pt reports he is limited with his reading and writing ability, but states he will always ask if he doesn't understand something.  TODAY'S TREATMENT: 02/14/24:  Therapeutic Exercise: -Passive wrist and digit extension stretching with elbow flexed and extended and forearm pronated and supinated; OT assist to maximize end ranges without substitution patterns  -Performed passive R wrist RD/UD with review of passive stretching needed daily to target UD d/t pt remaining limited with AROM in this plane of movement.  -Review of self passive stretching techniques using wall to simultaneously stretch elbow into extension along with wrist and digits into ext, working towards supinated forearm.  -R grip strengthening: Hand gripper set at 11.2# x1 trial, 17.9# x1.5, reduced back to 11.2# for last 1/2 of 3rd trial to remove jumbo pegs from pegboard -R wrist and  forearm strengthening with dumbbells: 2 sets x15 reps each  -wrist flex 9#, wrist ext 3#   -UD 3#; mod vc for positioning  -Pron/sup 9#  -Pt required min vc for form and slowing pace   Therapeutic Activity: -Facilitated R hand FMC/dexterity skills working with jumbo pegs.  Pt practiced picking up up to 3 pegs 1 by 1, storing in palm, and translation of 1 peg at a time palm to tip in prep for discarding into pegboard; visual demo to attempt translation  with supinated palm.   -Facilitated R hand FMC/dexterity skills working with glass stones: scooping, storing, translation palm to tip, digit opposition, and digit abd/add (mod A with ulnar digits)  PATIENT EDUCATION: Education details: Poc, anticipating d/c next visit d/t insurance limitations  Person educated: Patient Education method: Explanation, demo, tactile cues  Education comprehension: verbalized understanding, demonstrated understanding  HOME EXERCISE PROGRAM: R wrist and hand PROM, AROM/AAROM, ulnar nerve stretches, R hand grip/pinch strengthening, pink theraputty   GOALS: Goals reviewed with patient? Yes  SHORT TERM GOALS: Target date: 03/07/24  Pt will be indep to perform HEP for improving RUE strength and coordination for daily tasks. Baseline: Eval: Needs review/to be initiated in follow up sessions; 01/25/24: Pt reports he has been consistently using dumbbells at home for proximal and distal RUE strengthening, but does require min vc for safe resistance progression to prevent injury. Goal status: ongoing  LONG TERM GOALS: Target date: 04/18/24  1.  Pt will increase R grip strength by 10 or more lbs to securely hold his bag with cell phone/wallet in R dominant hand. Baseline: Eval: R grip 8 lbs (pt uses L non-dominant); 01/25/24: R grip 30 lbs Goal status: Achieved  2.  Pt will increase R grip strength to 40 lbs or more to enable pt to consistently use R dominant hand to turn a doorknob.  Baseline: Recert on 01/25/24: R grip 30 lbs  Goal status: New  2.  Pt will increase R lateral pinch strength by 5 or more lbs to ease ability to open drink bottles. Baseline: Eval: R 2 lbs; 01/25/24: R 4 lbs; able to open soda bottles, but wider apple juice tops still difficult Goal status: ongoing  3.  Pt will tolerate manual therapy, therapeutic modalities, and exercises to decrease pain in RUE to a reported 2/10 pain or less with activity.   Baseline: Eval: Pain in RUE ranges from 4  to 10/10 with [facility] activities; 01/25/24: 3/10 R elbow at rest and can increase to moderate-severe pain levels with activity Goal status: ongoing  4.  Pt will increase R hand FMC skills to improve efficiency with clothing fasteners as noted by completion of 9 hole peg test in <1 min. Baseline: Eval: unable to complete today d/t pain; had completed in 53 sec when discharged from OT on 07/26/23 prior to recent sx); 01/25/24: R 46 secs; buttons on jeans still challenging to fasten Goal status: achieved  5.  Pt will increase 3 point pinch strength by 3 or more lbs to enable pt to fasten jeans button at top of pants with good efficiency.  Baseline: Recert on 01/25/24: 3 lbs; difficulty fastening button on jeans  Goal status: New  5.  Pt will print/sign name with good legibility using built up pen as needed to sign documents. Baseline: Difficulty maintaining grasp of pen; illegible with R hand; 01/25/24: 100% legible with standard pen; prehension around pen is maintained, but loose.  Goal status: achieved  6.  Pt will increase  R hand strength/coordination to enable bathing with R hand.  Baseline: Compensating with L non-dominant; 01/25/24 still having some cramping when trying to wash with L hand, but improved  Goal status: ongoing   7.  Pt will be modified ind-indep to eat with R dominant hand using built up utensil as needed. Baseline: Eating with L non-dominant hand; 01/25/24: starting to stir food with spoon in R hand; can eat over half of meal with R hand using spoon or fork     Goal status: ongoing  ASSESSMENT:   Pt returns to Dr. Claudene Feb 2.  Pt thinks he needs surgery.  Pt continues to require intermittent vc for form/technique with dumbbell exercises for R wrist and forearm strengthening.  Pt reports that he continues to stretch his wrist and hand daily, and returned demo of wall stretches for helping to glide ulnar nerve.  Pt is limited in ability to supinate forearm with elbow extended  and shoulder abducted, but tolerates partial ranges passively, and can achieved greater stretch using wall at home.  R hand continues to present with significant intrinsic atrophy.  Ulnar nerve distribution from the elbow to the forearm remains tight.  Pt continues to struggle with R hand dexterity skills d/t weakness and tightness in the wrist, and digits.  Anticipate d/c next visit d/t insurance limitations.  Pt aware.    PERFORMANCE DEFICITS: in functional skills including ADLs, IADLs, coordination, dexterity, sensation, edema, ROM, strength, pain, fascial restrictions, muscle spasms, flexibility, Fine motor control, body mechanics, decreased knowledge of use of DME, skin integrity, and UE functional use, and psychosocial skills including coping strategies, environmental adaptation, habits, and routines and behaviors.   IMPAIRMENTS: are limiting patient from ADLs, IADLs, rest and sleep, work, and leisure.   COMORBIDITIES: has co-morbidities such as hx of 6 cervical spinal surgeries, bipolar 1, schizophrenia that affects occupational performance. Patient will benefit from skilled OT to address above impairments and improve overall function.  MODIFICATION OR ASSISTANCE TO COMPLETE EVALUATION: No modification of tasks or assist necessary to complete an evaluation.  OT OCCUPATIONAL PROFILE AND HISTORY: Problem focused assessment: Including review of records relating to presenting problem.  CLINICAL DECISION MAKING: Moderate - several treatment options, min-mod task modification necessary  REHAB POTENTIAL: Good  EVALUATION COMPLEXITY: High    PLAN:  OT FREQUENCY: 1-2x/week  OT DURATION: 12 weeks  PLANNED INTERVENTIONS: self care/ADL training, therapeutic exercise, therapeutic activity, neuromuscular re-education, manual therapy, scar mobilization, passive range of motion, splinting, electrical stimulation, paraffin, moist heat, cryotherapy, contrast bath, patient/family education, cognitive  remediation/compensation, psychosocial skills training, coping strategies training, and DME and/or AE instructions  RECOMMENDED OTHER SERVICES: PT for neck  CONSULTED AND AGREED WITH PLAN OF CARE: Patient  PLAN FOR NEXT SESSION: see above  Inocente Blazing, MS, OTR/L  "

## 2024-02-27 ENCOUNTER — Ambulatory Visit: Payer: MEDICAID

## 2024-02-27 ENCOUNTER — Encounter: Payer: MEDICAID | Admitting: Physical Therapy

## 2024-03-05 ENCOUNTER — Ambulatory Visit: Payer: MEDICAID | Admitting: Physical Therapy

## 2024-03-05 ENCOUNTER — Encounter: Payer: MEDICAID | Admitting: Physical Therapy

## 2024-03-12 ENCOUNTER — Ambulatory Visit: Payer: MEDICAID

## 2024-03-12 ENCOUNTER — Encounter: Payer: MEDICAID | Admitting: Physical Therapy

## 2024-03-13 ENCOUNTER — Other Ambulatory Visit: Payer: Self-pay | Admitting: Internal Medicine

## 2024-03-13 DIAGNOSIS — J449 Chronic obstructive pulmonary disease, unspecified: Secondary | ICD-10-CM

## 2024-03-14 NOTE — Telephone Encounter (Signed)
 Requested Prescriptions  Pending Prescriptions Disp Refills   SYMBICORT  160-4.5 MCG/ACT inhaler [Pharmacy Med Name: SYMBICORT  160-4.5 MCG INHALER] 10.2 each 3    Sig: INHALE 2 PUFFS INTO THE LUNGS TWICE A DAY     Pulmonology:  Combination Products Passed - 03/14/2024 11:22 AM      Passed - Valid encounter within last 12 months    Recent Outpatient Visits           3 months ago Hypertension goal BP (blood pressure) < 140/90   [facility] Bernardo Fend, DO   7 months ago Left knee pain, unspecified chronicity   [facility] Bernardo Fend, [place]   9 months ago Hypertension goal BP (blood pressure) < 140/90   [facility] Bernardo Fend, [place]

## 2024-03-18 ENCOUNTER — Ambulatory Visit: Payer: MEDICAID | Admitting: Neurosurgery

## 2024-03-19 ENCOUNTER — Ambulatory Visit: Payer: MEDICAID | Admitting: Physical Therapy

## 2024-03-19 ENCOUNTER — Encounter: Payer: MEDICAID | Admitting: Physical Therapy

## 2024-03-21 ENCOUNTER — Ambulatory Visit: Payer: MEDICAID | Admitting: Neurosurgery

## 2024-03-21 ENCOUNTER — Encounter: Payer: Self-pay | Admitting: Neurosurgery

## 2024-03-21 VITALS — BP 134/84 | Ht 73.0 in | Wt 226.0 lb

## 2024-03-21 DIAGNOSIS — S5401XS Injury of ulnar nerve at forearm level, right arm, sequela: Secondary | ICD-10-CM

## 2024-03-21 DIAGNOSIS — G5621 Lesion of ulnar nerve, right upper limb: Secondary | ICD-10-CM

## 2024-03-21 NOTE — Progress Notes (Signed)
" ° °  REFERRING PHYSICIAN:  Bernardo Sharyle Has [address] [place],  [place] [address]  DOS: 09/18/23  PSF C3-T1, decompression C3-C5 and C7-T1, posterolateral arthrodesis from C4-C5 to C7-T1  Discussed the use of AI scribe software for clinical note transcription with the patient, who gave verbal consent to proceed.  History of Present Illness Scott Howell is a 54 year old male with right ulnar nerve injury who presents with worsening right hand weakness.  He has progressive right hand weakness with marked loss of grip strength, frequent dropping of objects, and reliance on his left hand for daily activities. The right hand cramps and locks into a knot that needs to be pried open. Cold temperatures worsen stiffness and cramping. One finger no longer moves and he has persistent sensory loss in the right hand.  The right ulnar nerve injury occurred after a seizure with trauma following prior cervical spine surgery. He recalls normal hand function after the neck surgery while hospitalized, with significant decline only after the seizure-related injury. EMG nerve conduction studies showed polyphasia.  He previously used a right hand stimulator but stopped due to poor tolerance. He completed occupational and hand therapy and continues daily home exercises. Formal therapy ended due to insurance limits, and he is interested in resuming. He denies new trauma to the right hand or arm.   PHYSICAL EXAMINATION:  NEUROLOGICAL:  General: In no acute distress.   Awake, alert, oriented to person, place, and time.  Pupils equal round and reactive to light.  Facial tone is symmetric.    Strength: Very minimal but improved ulnar intrinsic function noted on physical examination.  He has at least minimal activation in his ulnar intrinsics specially first dorsal interosseous and some of his ulnar innervated lumbrical muscles.  Incision well healed.   Imaging:  Cervical xrays dated  12/07/23:  No complications noted.  Report for above xrays not yet available.   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 EMG demonstrated polyphasia, indicating limited nerve regeneration, but overall recovery remains poor. Minimal intrinsic hand muscle function is present, with some lumbrical and pinch strength returning. Prognosis for meaningful recovery is poor due to the extent of injury and prior damage. Surgical intervention is not indicated given the chronicity and severity of the nerve injury. Rehabilitation remains the primary management, though anticipated outcomes are limited. - Referred to occupational therapy for hand rehabilitation. - Recommended continuation of home exercises as previously instructed by therapy. - Advised him to contact therapy to schedule sessions.  Penne LELON Sharps MD "

## 2024-03-26 ENCOUNTER — Ambulatory Visit: Payer: MEDICAID | Admitting: Physical Therapy

## 2024-03-26 ENCOUNTER — Encounter: Payer: MEDICAID | Admitting: Physical Therapy

## 2024-03-27 ENCOUNTER — Ambulatory Visit: Payer: MEDICAID

## 2024-04-02 ENCOUNTER — Encounter: Payer: MEDICAID | Admitting: Physical Therapy

## 2024-04-02 ENCOUNTER — Ambulatory Visit: Payer: MEDICAID | Admitting: Physical Therapy

## 2024-04-09 ENCOUNTER — Ambulatory Visit: Payer: MEDICAID | Admitting: Physical Therapy

## 2024-04-09 ENCOUNTER — Encounter: Payer: MEDICAID | Admitting: Physical Therapy

## 2024-04-16 ENCOUNTER — Ambulatory Visit: Payer: MEDICAID | Admitting: Physical Therapy

## 2024-04-16 ENCOUNTER — Encounter: Payer: MEDICAID | Admitting: Physical Therapy

## 2024-04-23 ENCOUNTER — Ambulatory Visit: Payer: MEDICAID | Admitting: Physical Therapy

## 2024-04-23 ENCOUNTER — Encounter: Payer: MEDICAID | Admitting: Physical Therapy

## 2024-04-30 ENCOUNTER — Ambulatory Visit: Payer: MEDICAID | Admitting: Physical Therapy

## 2024-04-30 ENCOUNTER — Encounter: Payer: MEDICAID | Admitting: Physical Therapy

## 2024-05-07 ENCOUNTER — Ambulatory Visit: Payer: MEDICAID | Attending: Neurosurgery

## 2024-05-09 ENCOUNTER — Ambulatory Visit: Payer: MEDICAID

## 2024-05-14 ENCOUNTER — Ambulatory Visit: Payer: MEDICAID

## 2024-05-16 ENCOUNTER — Ambulatory Visit: Payer: MEDICAID

## 2024-05-23 ENCOUNTER — Ambulatory Visit: Payer: MEDICAID

## 2024-05-30 ENCOUNTER — Ambulatory Visit: Payer: MEDICAID

## 2024-05-31 ENCOUNTER — Ambulatory Visit: Payer: MEDICAID | Admitting: Internal Medicine

## 2024-06-11 ENCOUNTER — Ambulatory Visit: Payer: MEDICAID | Admitting: Neurosurgery
